# Patient Record
Sex: Male | Born: 1960 | Race: Black or African American | Hispanic: No | Marital: Married | Smoking: Never smoker
Health system: Southern US, Community
[De-identification: ages and names within clinical notes are randomized; demographics above are authoritative.]

## PROBLEM LIST (undated history)

## (undated) DIAGNOSIS — M1990 Unspecified osteoarthritis, unspecified site: Secondary | ICD-10-CM

## (undated) DIAGNOSIS — Z95810 Presence of automatic (implantable) cardiac defibrillator: Secondary | ICD-10-CM

## (undated) DIAGNOSIS — N189 Chronic kidney disease, unspecified: Secondary | ICD-10-CM

## (undated) DIAGNOSIS — I5020 Unspecified systolic (congestive) heart failure: Secondary | ICD-10-CM

## (undated) DIAGNOSIS — R918 Other nonspecific abnormal finding of lung field: Secondary | ICD-10-CM

## (undated) DIAGNOSIS — I509 Heart failure, unspecified: Secondary | ICD-10-CM

## (undated) DIAGNOSIS — G4730 Sleep apnea, unspecified: Secondary | ICD-10-CM

## (undated) DIAGNOSIS — I472 Ventricular tachycardia, unspecified: Secondary | ICD-10-CM

## (undated) DIAGNOSIS — D447 Neoplasm of uncertain behavior of aortic body and other paraganglia: Secondary | ICD-10-CM

## (undated) DIAGNOSIS — E039 Hypothyroidism, unspecified: Secondary | ICD-10-CM

## (undated) DIAGNOSIS — D494 Neoplasm of unspecified behavior of bladder: Secondary | ICD-10-CM

## (undated) DIAGNOSIS — I2510 Atherosclerotic heart disease of native coronary artery without angina pectoris: Secondary | ICD-10-CM

## (undated) DIAGNOSIS — I10 Essential (primary) hypertension: Secondary | ICD-10-CM

## (undated) HISTORY — DX: Other nonspecific abnormal finding of lung field: R91.8

## (undated) HISTORY — PX: US ECHOCARDIOGRAPHY: HXRAD669

## (undated) HISTORY — DX: Ventricular tachycardia: I47.2

## (undated) HISTORY — DX: Sleep apnea, unspecified: G47.30

## (undated) HISTORY — PX: CARDIAC CATHETERIZATION: SHX172

## (undated) HISTORY — PX: BIV ICD GENERTAOR CHANGE OUT: SHX5745

## (undated) HISTORY — PX: TONSILLECTOMY: SUR1361

## (undated) HISTORY — PX: OTHER SURGICAL HISTORY: SHX169

## (undated) HISTORY — PX: CHOLECYSTECTOMY: SHX55

## (undated) HISTORY — DX: Ventricular tachycardia, unspecified: I47.20

## (undated) HISTORY — PX: CARDIAC DEFIBRILLATOR PLACEMENT: SHX171

## (undated) HISTORY — DX: Essential (primary) hypertension: I10

---

## 1997-06-13 ENCOUNTER — Ambulatory Visit (HOSPITAL_COMMUNITY): Admission: RE | Admit: 1997-06-13 | Discharge: 1997-06-13 | Payer: Self-pay | Admitting: Cardiovascular Disease

## 2002-06-11 ENCOUNTER — Encounter: Admission: RE | Admit: 2002-06-11 | Discharge: 2002-09-09 | Payer: Self-pay | Admitting: Family Medicine

## 2002-07-19 ENCOUNTER — Encounter: Payer: Self-pay | Admitting: Nephrology

## 2002-07-19 ENCOUNTER — Encounter: Admission: RE | Admit: 2002-07-19 | Discharge: 2002-07-19 | Payer: Self-pay | Admitting: Nephrology

## 2003-04-11 ENCOUNTER — Ambulatory Visit (HOSPITAL_BASED_OUTPATIENT_CLINIC_OR_DEPARTMENT_OTHER): Admission: RE | Admit: 2003-04-11 | Discharge: 2003-04-11 | Payer: Self-pay | Admitting: Orthopedic Surgery

## 2003-04-11 ENCOUNTER — Ambulatory Visit (HOSPITAL_COMMUNITY): Admission: RE | Admit: 2003-04-11 | Discharge: 2003-04-11 | Payer: Self-pay | Admitting: Orthopedic Surgery

## 2005-05-02 ENCOUNTER — Ambulatory Visit: Payer: Self-pay | Admitting: Otolaryngology

## 2005-05-02 ENCOUNTER — Encounter: Payer: Self-pay | Admitting: Internal Medicine

## 2005-05-02 ENCOUNTER — Ambulatory Visit (HOSPITAL_BASED_OUTPATIENT_CLINIC_OR_DEPARTMENT_OTHER): Admission: RE | Admit: 2005-05-02 | Discharge: 2005-05-02 | Payer: Self-pay | Admitting: Otolaryngology

## 2005-12-15 ENCOUNTER — Encounter (HOSPITAL_COMMUNITY): Admission: RE | Admit: 2005-12-15 | Discharge: 2006-03-15 | Payer: Self-pay | Admitting: Cardiovascular Disease

## 2006-01-11 ENCOUNTER — Ambulatory Visit: Payer: Self-pay | Admitting: Internal Medicine

## 2006-10-27 ENCOUNTER — Ambulatory Visit: Payer: Self-pay | Admitting: Internal Medicine

## 2006-10-27 ENCOUNTER — Inpatient Hospital Stay (HOSPITAL_COMMUNITY): Admission: EM | Admit: 2006-10-27 | Discharge: 2006-11-04 | Payer: Self-pay | Admitting: Emergency Medicine

## 2006-10-27 ENCOUNTER — Encounter: Payer: Self-pay | Admitting: Internal Medicine

## 2006-11-16 ENCOUNTER — Ambulatory Visit: Payer: Self-pay

## 2006-12-08 ENCOUNTER — Ambulatory Visit: Payer: Self-pay | Admitting: Internal Medicine

## 2006-12-08 ENCOUNTER — Inpatient Hospital Stay (HOSPITAL_COMMUNITY): Admission: AD | Admit: 2006-12-08 | Discharge: 2006-12-12 | Payer: Self-pay | Admitting: Internal Medicine

## 2007-02-21 ENCOUNTER — Ambulatory Visit: Payer: Self-pay | Admitting: Internal Medicine

## 2007-03-12 ENCOUNTER — Ambulatory Visit: Payer: Self-pay | Admitting: Pulmonary Disease

## 2007-03-12 DIAGNOSIS — G4733 Obstructive sleep apnea (adult) (pediatric): Secondary | ICD-10-CM | POA: Insufficient documentation

## 2007-03-12 DIAGNOSIS — I10 Essential (primary) hypertension: Secondary | ICD-10-CM | POA: Insufficient documentation

## 2007-03-12 DIAGNOSIS — I428 Other cardiomyopathies: Secondary | ICD-10-CM | POA: Insufficient documentation

## 2007-04-09 ENCOUNTER — Ambulatory Visit: Payer: Self-pay | Admitting: Pulmonary Disease

## 2007-05-29 ENCOUNTER — Encounter: Payer: Self-pay | Admitting: Pulmonary Disease

## 2007-06-05 ENCOUNTER — Ambulatory Visit: Payer: Self-pay | Admitting: Internal Medicine

## 2007-06-07 ENCOUNTER — Ambulatory Visit: Payer: Self-pay | Admitting: Pulmonary Disease

## 2007-07-22 ENCOUNTER — Encounter: Payer: Self-pay | Admitting: Pulmonary Disease

## 2007-09-06 ENCOUNTER — Ambulatory Visit: Payer: Self-pay | Admitting: Pulmonary Disease

## 2007-10-23 ENCOUNTER — Ambulatory Visit: Payer: Self-pay | Admitting: Internal Medicine

## 2007-10-23 LAB — CONVERTED CEMR LAB
AST: 24 units/L (ref 0–37)
Albumin: 3.6 g/dL (ref 3.5–5.2)
Digitoxin Lvl: 1.2 ng/mL (ref 0.8–2.0)
TSH: 1.23 microintl units/mL (ref 0.35–5.50)
Total Bilirubin: 0.8 mg/dL (ref 0.3–1.2)

## 2008-02-11 ENCOUNTER — Ambulatory Visit: Payer: Self-pay | Admitting: Internal Medicine

## 2008-02-21 ENCOUNTER — Ambulatory Visit (HOSPITAL_COMMUNITY): Admission: RE | Admit: 2008-02-21 | Discharge: 2008-02-21 | Payer: Self-pay | Admitting: Internal Medicine

## 2008-02-21 ENCOUNTER — Ambulatory Visit: Payer: Self-pay | Admitting: Internal Medicine

## 2008-03-09 ENCOUNTER — Emergency Department (HOSPITAL_BASED_OUTPATIENT_CLINIC_OR_DEPARTMENT_OTHER): Admission: EM | Admit: 2008-03-09 | Discharge: 2008-03-09 | Payer: Self-pay | Admitting: Emergency Medicine

## 2008-03-09 ENCOUNTER — Ambulatory Visit: Payer: Self-pay | Admitting: Diagnostic Radiology

## 2008-03-11 ENCOUNTER — Ambulatory Visit: Payer: Self-pay | Admitting: Internal Medicine

## 2008-03-17 ENCOUNTER — Encounter: Payer: Self-pay | Admitting: Cardiovascular Disease

## 2008-03-25 ENCOUNTER — Ambulatory Visit: Payer: Self-pay | Admitting: Internal Medicine

## 2008-03-25 DIAGNOSIS — R0602 Shortness of breath: Secondary | ICD-10-CM | POA: Insufficient documentation

## 2008-03-25 DIAGNOSIS — R930 Abnormal findings on diagnostic imaging of skull and head, not elsewhere classified: Secondary | ICD-10-CM | POA: Insufficient documentation

## 2008-03-26 LAB — CONVERTED CEMR LAB
Basophils Absolute: 0 10*3/uL (ref 0.0–0.1)
Basophils Relative: 0 % (ref 0.0–3.0)
CO2: 30 meq/L (ref 19–32)
Chloride: 106 meq/L (ref 96–112)
Glucose, Bld: 116 mg/dL — ABNORMAL HIGH (ref 70–99)
Hemoglobin: 14.5 g/dL (ref 13.0–17.0)
Lymphocytes Relative: 15 % (ref 12.0–46.0)
MCHC: 33.2 g/dL (ref 30.0–36.0)
Monocytes Relative: 6.2 % (ref 3.0–12.0)
Neutro Abs: 9.9 10*3/uL — ABNORMAL HIGH (ref 1.4–7.7)
Neutrophils Relative %: 78.4 % — ABNORMAL HIGH (ref 43.0–77.0)
Pro B Natriuretic peptide (BNP): 291 pg/mL — ABNORMAL HIGH (ref 0.0–100.0)
RBC: 5.04 M/uL (ref 4.22–5.81)
RDW: 15.3 % — ABNORMAL HIGH (ref 11.5–14.6)
Sed Rate: 17 mm/hr — ABNORMAL HIGH (ref 0–16)
Sodium: 139 meq/L (ref 135–145)
TSH: 1.47 microintl units/mL (ref 0.35–5.50)

## 2008-04-03 ENCOUNTER — Ambulatory Visit: Payer: Self-pay | Admitting: Internal Medicine

## 2008-04-03 LAB — CONVERTED CEMR LAB
CO2: 27 meq/L (ref 19–32)
Chloride: 105 meq/L (ref 96–112)
Eosinophils Relative: 0.7 % (ref 0.0–5.0)
GFR calc Af Amer: 59 mL/min
Glucose, Bld: 113 mg/dL — ABNORMAL HIGH (ref 70–99)
INR: 1.2 — ABNORMAL HIGH (ref 0.8–1.0)
Lymphocytes Relative: 23.6 % (ref 12.0–46.0)
Monocytes Absolute: 0.6 10*3/uL (ref 0.1–1.0)
Monocytes Relative: 6.7 % (ref 3.0–12.0)
Neutrophils Relative %: 68.4 % (ref 43.0–77.0)
Platelets: 182 10*3/uL (ref 150–400)
Potassium: 4.1 meq/L (ref 3.5–5.1)
Prothrombin Time: 12.4 s (ref 10.9–13.3)
RDW: 15.1 % — ABNORMAL HIGH (ref 11.5–14.6)
Sodium: 139 meq/L (ref 135–145)
WBC: 8.8 10*3/uL (ref 4.5–10.5)

## 2008-04-10 ENCOUNTER — Ambulatory Visit: Payer: Self-pay | Admitting: Internal Medicine

## 2008-04-10 ENCOUNTER — Inpatient Hospital Stay (HOSPITAL_COMMUNITY): Admission: RE | Admit: 2008-04-10 | Discharge: 2008-04-12 | Payer: Self-pay | Admitting: Internal Medicine

## 2008-04-14 ENCOUNTER — Ambulatory Visit: Payer: Self-pay | Admitting: Internal Medicine

## 2008-04-15 ENCOUNTER — Encounter: Payer: Self-pay | Admitting: Internal Medicine

## 2008-05-15 ENCOUNTER — Encounter: Payer: Self-pay | Admitting: Internal Medicine

## 2008-06-12 ENCOUNTER — Ambulatory Visit (HOSPITAL_COMMUNITY): Admission: RE | Admit: 2008-06-12 | Discharge: 2008-06-13 | Payer: Self-pay | Admitting: Otolaryngology

## 2008-06-16 ENCOUNTER — Telehealth (INDEPENDENT_AMBULATORY_CARE_PROVIDER_SITE_OTHER): Payer: Self-pay | Admitting: *Deleted

## 2008-06-18 ENCOUNTER — Ambulatory Visit: Payer: Self-pay | Admitting: Internal Medicine

## 2008-07-24 ENCOUNTER — Telehealth: Payer: Self-pay | Admitting: Internal Medicine

## 2008-08-11 ENCOUNTER — Telehealth (INDEPENDENT_AMBULATORY_CARE_PROVIDER_SITE_OTHER): Payer: Self-pay | Admitting: *Deleted

## 2008-09-23 ENCOUNTER — Telehealth (INDEPENDENT_AMBULATORY_CARE_PROVIDER_SITE_OTHER): Payer: Self-pay | Admitting: *Deleted

## 2008-10-06 ENCOUNTER — Telehealth (INDEPENDENT_AMBULATORY_CARE_PROVIDER_SITE_OTHER): Payer: Self-pay | Admitting: *Deleted

## 2008-10-20 ENCOUNTER — Encounter: Payer: Self-pay | Admitting: Internal Medicine

## 2008-10-20 ENCOUNTER — Ambulatory Visit: Payer: Self-pay | Admitting: Internal Medicine

## 2008-10-20 DIAGNOSIS — I472 Ventricular tachycardia, unspecified: Secondary | ICD-10-CM | POA: Insufficient documentation

## 2008-10-20 DIAGNOSIS — I4729 Other ventricular tachycardia: Secondary | ICD-10-CM | POA: Insufficient documentation

## 2008-10-23 ENCOUNTER — Telehealth (INDEPENDENT_AMBULATORY_CARE_PROVIDER_SITE_OTHER): Payer: Self-pay | Admitting: *Deleted

## 2008-10-23 ENCOUNTER — Encounter: Payer: Self-pay | Admitting: Internal Medicine

## 2008-11-06 ENCOUNTER — Encounter (HOSPITAL_COMMUNITY): Admission: RE | Admit: 2008-11-06 | Discharge: 2009-02-04 | Payer: Self-pay | Admitting: Internal Medicine

## 2008-11-06 ENCOUNTER — Telehealth (INDEPENDENT_AMBULATORY_CARE_PROVIDER_SITE_OTHER): Payer: Self-pay | Admitting: *Deleted

## 2008-11-12 ENCOUNTER — Encounter: Payer: Self-pay | Admitting: Internal Medicine

## 2008-11-13 ENCOUNTER — Encounter: Payer: Self-pay | Admitting: Internal Medicine

## 2008-12-09 ENCOUNTER — Encounter: Payer: Self-pay | Admitting: Internal Medicine

## 2008-12-17 ENCOUNTER — Encounter: Payer: Self-pay | Admitting: Internal Medicine

## 2008-12-25 ENCOUNTER — Encounter: Payer: Self-pay | Admitting: Internal Medicine

## 2008-12-30 ENCOUNTER — Encounter: Payer: Self-pay | Admitting: Internal Medicine

## 2009-01-27 ENCOUNTER — Encounter: Payer: Self-pay | Admitting: Internal Medicine

## 2009-02-02 ENCOUNTER — Ambulatory Visit: Payer: Self-pay | Admitting: Internal Medicine

## 2009-02-11 ENCOUNTER — Encounter: Payer: Self-pay | Admitting: Internal Medicine

## 2009-03-12 ENCOUNTER — Encounter: Payer: Self-pay | Admitting: Internal Medicine

## 2009-05-05 ENCOUNTER — Ambulatory Visit: Payer: Self-pay | Admitting: Internal Medicine

## 2009-05-12 ENCOUNTER — Encounter: Payer: Self-pay | Admitting: Internal Medicine

## 2009-05-25 ENCOUNTER — Encounter: Payer: Self-pay | Admitting: Internal Medicine

## 2009-07-02 ENCOUNTER — Encounter: Admission: RE | Admit: 2009-07-02 | Discharge: 2009-07-02 | Payer: Self-pay | Admitting: Family Medicine

## 2009-07-03 ENCOUNTER — Inpatient Hospital Stay (HOSPITAL_COMMUNITY): Admission: EM | Admit: 2009-07-03 | Discharge: 2009-07-08 | Payer: Self-pay | Admitting: Emergency Medicine

## 2009-07-03 ENCOUNTER — Ambulatory Visit (HOSPITAL_BASED_OUTPATIENT_CLINIC_OR_DEPARTMENT_OTHER): Admission: RE | Admit: 2009-07-03 | Discharge: 2009-07-03 | Payer: Self-pay | Admitting: Family Medicine

## 2009-07-03 ENCOUNTER — Ambulatory Visit: Payer: Self-pay | Admitting: Diagnostic Radiology

## 2009-07-04 ENCOUNTER — Encounter (INDEPENDENT_AMBULATORY_CARE_PROVIDER_SITE_OTHER): Payer: Self-pay | Admitting: Surgery

## 2009-08-10 ENCOUNTER — Encounter: Payer: Self-pay | Admitting: Internal Medicine

## 2009-08-11 ENCOUNTER — Ambulatory Visit: Payer: Self-pay | Admitting: Internal Medicine

## 2009-09-18 ENCOUNTER — Encounter: Payer: Self-pay | Admitting: Internal Medicine

## 2009-11-03 ENCOUNTER — Ambulatory Visit: Payer: Self-pay | Admitting: Internal Medicine

## 2009-11-03 DIAGNOSIS — R42 Dizziness and giddiness: Secondary | ICD-10-CM | POA: Insufficient documentation

## 2009-11-10 LAB — CONVERTED CEMR LAB
Chloride: 104 meq/L (ref 96–112)
GFR calc non Af Amer: 85.49 mL/min (ref >60)
Glucose, Bld: 74 mg/dL (ref 70–99)
Potassium: 4.4 meq/L (ref 3.5–5.1)
Sodium: 140 meq/L (ref 135–145)

## 2009-11-12 ENCOUNTER — Ambulatory Visit: Payer: Self-pay | Admitting: Internal Medicine

## 2009-11-12 DIAGNOSIS — E0590 Thyrotoxicosis, unspecified without thyrotoxic crisis or storm: Secondary | ICD-10-CM | POA: Insufficient documentation

## 2009-12-07 LAB — CONVERTED CEMR LAB
Free T4: 1.59 ng/dL (ref 0.60–1.60)
T3, Free: 3.7 pg/mL (ref 2.3–4.2)
TSH: 0.09 microintl units/mL — ABNORMAL LOW (ref 0.35–5.50)

## 2010-02-04 ENCOUNTER — Ambulatory Visit: Payer: Self-pay | Admitting: Internal Medicine

## 2010-02-05 ENCOUNTER — Encounter: Payer: Self-pay | Admitting: Internal Medicine

## 2010-02-11 ENCOUNTER — Encounter: Payer: Self-pay | Admitting: Internal Medicine

## 2010-02-19 ENCOUNTER — Encounter: Payer: Self-pay | Admitting: Internal Medicine

## 2010-03-11 ENCOUNTER — Ambulatory Visit: Payer: Self-pay | Admitting: Cardiovascular Disease

## 2010-03-27 ENCOUNTER — Encounter: Payer: Self-pay | Admitting: Internal Medicine

## 2010-04-08 NOTE — Miscellaneous (Signed)
Summary: Device upgrade  Clinical Lists Changes  Observations: Added new observation of ICDLEADSTAT3: active (03/27/2010 11:40) Added new observation of ICDLEADSER3: [id number] (03/27/2010 11:40) Added new observation of ICDLEADMOD3: 4194  (03/27/2010 11:40) Added new observation of ICDLEADDOI3: 04/11/2008  (03/27/2010 11:40) Added new observation of ICDLEADLOC3: LV  (03/27/2010 11:40) Added new observation of ICDLEADSTAT2: active  (03/27/2010 11:40) Added new observation of ICDLEADSER2: [id number]  (03/27/2010 11:40) Added new observation of ICDLEADMOD2: 5076  (03/27/2010 11:40) Added new observation of ICDLEADDOI2: 04/11/2008  (03/27/2010 11:40) Added new observation of ICDLEADLOC2: RA  (03/27/2010 11:40) Added new observation of ICD IMPL DTE: 04/11/2008  (03/27/2010 11:40) Added new observation of ICD SERL#: [id number]  (03/27/2010 11:40) Added new observation of ICD MODL#: [id number]  (03/27/2010 11:40) Added new observation of ICDEXPLCOMM: 04/11/08 Medtronic [id number] H explanted  (03/27/2010 11:40)       ICD Specifications Following MD:  Sherryl Manges, MD     ICD Vendor:  Medtronic     ICD Model Number:  [phone]     ICD Serial Number:  [id number] ICD DOI:  04/11/2008     ICD Implanting MD:  Sherryl Manges, MD  Lead 1:    Location: RV     DOI: 11/03/2006     Model #: [id number]     Serial #: [id number]     Status: active Lead 2:    Location: RA     DOI: 04/11/2008     Model #: [id number]     Serial #: [id number]     Status: active Lead 3:    Location: LV     DOI: 04/11/2008     Model #: [id number]     Serial #: [id number]     Status: active  Indications::  VT; NICM  Explantation Comments: 04/11/08 Medtronic [id number]/[id number] H explanted  ICD Follow Up ICD Dependent:  No       ICD Device Measurements Configuration: LVtip-LV ring  Brady Parameters Mode DDD     Lower Rate Limit:  50     Upper Rate Limit 130 PAV 150     Sensed AV Delay:  120  Tachy Zones VF:  207     VT:  200-207 (FVT VIA  VT)     VT1:  146

## 2010-04-08 NOTE — Letter (Signed)
Summary: [facility]  [facility]   Imported By: Lennie Odor 03/30/2009 10:26:54  _____________________________________________________________________  External Attachment:    Type:   Image     Comment:   External Document

## 2010-04-08 NOTE — Letter (Signed)
Summary: Device-Delinquent Phone Journalist, newspaper, Main Office  [address]   [place], [place] [address]   Phone: [phone]  Fax: [phone]     February 05, 2010 MRN: [id number]   SHAD LEDVINA [address] [place], [place]  [address]   Dear Mr. CRISMAN,  According to our records, you were scheduled for a device phone transmission on 02-04-2010.     We did not receive any results from this check.  If you transmitted on your scheduled day, please call us to help troubleshoot your system.  If you forgot to send your transmission, please send one upon receipt of this letter.  Thank you,   Architectural technologist [facility]

## 2010-04-08 NOTE — Assessment & Plan Note (Signed)
Summary: [name]   Referring Provider:  Dr.Nahser Primary Provider:  Catha Gosselin  CC:  device check.  Pt states he does occasionally feel lightheaded.  History of Present Illness: [name] is seen in followup for congestive heart failure in the setting of nonischemic cardiomyopathy. He is status post CRT-D. implantation.  get he has been doing pretty well. He has not had significant edema. His biggest complaint is lightheadedness particularly upon standing. He also has a cloudiness in his head that is relatively persistent.  He underwent cholecystectomy in May. He was told it was "quite infected". he is unaware of episodes of ventricular tachycardia that were temporally associated with the event    Current Medications (verified): 1)  Lipitor 40 Mg  Tabs (Atorvastatin Calcium) .... Take 1 Tablet By Mouth Once A Day 2)  Spironolactone 25 Mg  Tabs (Spironolactone) .... Twice Weekly 3)  Carvedilol 25 Mg  Tabs (Carvedilol) .... Take 1 Tablet By Mouth Two Times A Day 4)  Amiodarone Hcl 200 Mg Tabs (Amiodarone Hcl) .Marland Kitchen.. 1 Once Daily 3 Times Per Wk 5)  Lanoxin 0.25 Mg  Tabs (Digoxin) .... Take 1 Tablet By Mouth Once A Day 6)  Benicar 40 Mg  Tabs (Olmesartan Medoxomil) .... One Tablet By Mouth Daily  Allergies (verified): No Known Drug Allergies  Past History:  Past Medical History: Last updated: 10/20/2008 AUTOMATIC IMPLANTABLE CARDIAC DEFIBRILLATOR SITU (ICD-V45.02)-Medtronic Virtuoso D154VWC ESSENTIAL HYPERTENSION, BENIGN (ICD-401.1) CARDIOMYOPATHY (ICD-425.4)     - Echocardiogram 3/07  OBSTRUCTIVE SLEEP APNEA (ICD-327.23) Right Paratracheal Mass    - Detected 10/2006    - No change June 18, 2008  Gout  Past Surgical History: Last updated: 10/18/2008 Deviated septum surgery April 2010  Implantation of AICD-Medtronic Virtuoso D154VWC  Family History: Last updated: 03/12/2007 pt was adopted...unknown  Social History: Last updated: 03/12/2007 works as  Engineer, civil (consulting) Patient never smoked.  married and has children.  Vital Signs:  Patient profile:   50 year old male Height:      70 inches Weight:      241 pounds BMI:     34.70 Pulse rate:   82 / minute Pulse rhythm:   regular BP sitting:   130 / 82  (left arm) Cuff size:   regular  Vitals Entered By: Judithe Modest CMA (November 03, 2009 12:46 PM)  Physical Exam  General:  The patient was alert and oriented in no acute distress. HEENT Normal.  Neck veins were flat, carotids were brisk.  Lungs were clear.  Heart sounds were regular without murmurs or gallops.  Abdomen was soft with active bowel sounds. There is no clubbing cyanosis or edema. Skin Warm and dry     ICD Specifications Following MD:  Sherryl Manges, MD     ICD Vendor:  Medtronic     ICD Model Number:  D154VWC     ICD Serial Number:  [id number] ICD DOI:  11/03/2006     ICD Implanting MD:  Sherryl Manges, MD  Lead 1:    Location: RV     DOI: 11/03/2006     Model #: [id number]     Serial #: [id number]     Status: active  Indications::  VT; NICM  Explantation Comments: Carelink  ICD Follow Up Battery Voltage:  3.07 V     Charge Time:  9.0 seconds     Underlying rhythm:  SR ICD Dependent:  No       ICD Device Measurements Atrium:  Amplitude: 2.9 mV, Impedance: 532  ohms, Threshold: 0.50 V at 0.40 msec Right Ventricle:  Amplitude: 20 mV, Impedance: 532 ohms, Threshold: 0.75 V at 0.40 msec Left Ventricle:  Impedance: 741 ohms, Threshold: 0.25 V at 0.40 msec Configuration: LVtip-LV ring  Episodes MS Episodes:  1     Percent Mode Switch:  <0.1%     Shock:  0     ATP:  3     Nonsustained:  3     Atrial Therapies:  0 Atrial Pacing:  0.2%     Ventricular Pacing:  97.4%  Brady Parameters Mode DDD     Lower Rate Limit:  50     Upper Rate Limit 130 PAV 150     Sensed AV Delay:  120  Tachy Zones VF:  207     VT:  200-207 (FVT VIA VT)     VT1:  146     Next Remote Date:  02/04/2010     Next Cardiology Appt Due:   10/06/2010 Tech Comments:  3 TREATED VT EPISODES W/SUCCESSFUL ATP THERAPY.  NORMAL DEVICE FUNCTION.  CHANGED RA OUTPUT FROM 3.50 TO 2.00 AND TURNED SVT 1:1 ON.  CARELINK CHECK 02-04-10. ROV IN 12 MTHS W/SK. Vella Kohler  November 03, 2009 1:18 PM  Impression & Recommendations:  Problem # 1:  SYSTOLIC HEART FAILURE, CHRONIC (ICD-428.22)  relatively stable on his current medications. His updated medication list for this problem includes:    Spironolactone 25 Mg Tabs (Spironolactone) .Marland Kitchen... Twice weekly    Carvedilol 25 Mg Tabs (Carvedilol) .Marland Kitchen... Take 1 tablet by mouth two times a day    Amiodarone Hcl 200 Mg Tabs (Amiodarone hcl) .Marland Kitchen... 1 once daily 3 times per wk    Lanoxin 0.25 Mg Tabs (Digoxin) .Marland Kitchen... Take 1 tablet by mouth once a day    Benicar 40 Mg Tabs (Olmesartan medoxomil) ..... One tablet by mouth daily  His updated medication list for this problem includes:    Spironolactone 25 Mg Tabs (Spironolactone) .Marland Kitchen... Twice weekly    Carvedilol 25 Mg Tabs (Carvedilol) .Marland Kitchen... Take 1 tablet by mouth two times a day    Amiodarone Hcl 200 Mg Tabs (Amiodarone hcl) .Marland Kitchen... 1 once daily 3 times per wk    Lanoxin 0.25 Mg Tabs (Digoxin) .Marland Kitchen... Take 1 tablet by mouth once a day    Benicar 40 Mg Tabs (Olmesartan medoxomil) ..... One tablet by mouth daily  Problem # 2:  Hx of VENTRICULAR TACHYCARDIA (ICD-427.1) patient recurrent ventricular tachycardia temporally related to his GB surgery.  WIll continue current meds for now  LFTs were normal in May. We'll need to recheck his TSH. 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 We will plan to stop the amiodaroneand see him again in 3 months  Problem # 4:  IMPLANTATION OF DEFIBRILLATOR,MEDTRONIC VIRTUOSO D154VWC (ICD-V45.02) Device parameters and data were reviewed and no changes were  made  Problem # 5:  CARDIOMYOPATHY (ICD-425.4) stable on current meds; renal function has been borderline in the past. we'll recheck a metabolic profile today as well as a digoxin level and a TSH His updated medication list for this problem includes:    Spironolactone 25 Mg Tabs (Spironolactone) .Marland Kitchen... Twice weekly    Carvedilol 25 Mg Tabs (Carvedilol) .Marland Kitchen... Take 1 tablet by mouth two times a day    Amiodarone Hcl 200 Mg Tabs (Amiodarone hcl) .Marland Kitchen... 1 once daily 3  times per wk    Lanoxin 0.25 Mg Tabs (Digoxin) .Marland Kitchen... Take 1 tablet by mouth once a day    Benicar 40 Mg Tabs (Olmesartan medoxomil) ..... One tablet by mouth daily  Other Orders: TLB-BMP (Basic Metabolic Panel-BMET) (80048-METABOL) TLB-TSH (Thyroid Stimulating Hormone) (84443-TSH) TLB-Digoxin (Lanoxin) (80162-DIG)  Patient Instructions: 1)  Your physician recommends that you schedule a follow-up appointment in: 3 MONTHS WITH DR Graciela Husbands 2)  Your physician recommends that you return for lab work EA:VWUJW BMET TSH DIG LEVEL 3)  Your physician has recommended you make the following change in your medication: HOLD AMIODARONE

## 2010-04-08 NOTE — Letter (Signed)
Summary: Remote Device Check  [employer], Main Office  [address]   [place], [place] [address]   Phone: [phone]  Fax: [phone]     September 18, 2009 MRN: [id number]   GRADIE OHM [address] [place], [place]  [address]   Dear Mr. MESSER,   Your remote transmission was recieved and reviewed by your physician.  All diagnostics were within normal limits for you.   __X____Your next office visit is scheduled for:   August with Dr Graciela Husbands. Please call our office to schedule an appointment.    Sincerely,  Vella Kohler

## 2010-04-08 NOTE — Letter (Signed)
Summary: [facility] Office Note  [facility] Office Note   Imported By: Roderic Ovens 04/01/2009 10:47:19  _____________________________________________________________________  External Attachment:    Type:   Image     Comment:   External Document

## 2010-04-08 NOTE — Cardiovascular Report (Signed)
Summary: Office Visit Remote   Office Visit Remote   Imported By: Roderic Ovens 02/22/2010 16:00:04  _____________________________________________________________________  External Attachment:    Type:   Image     Comment:   External Document

## 2010-04-08 NOTE — Letter (Signed)
Summary: Remote Device Check  [employer] [facility], Main Office  [address]   [place], [place] [address]   Phone: [phone]  Fax: [phone]     May 25, 2009 MRN: [id number]   [name] [address] [place], [place]  [address]   Dear [name],   Your remote transmission was recieved and reviewed by your physician.  All diagnostics were within normal limits for you.  __X___Your next transmission is scheduled for:   August 11, 2009.  Please transmit at any time this day.  If you have a wireless device your transmission will be sent automatically.     Sincerely,  Proofreader

## 2010-04-08 NOTE — Cardiovascular Report (Signed)
Summary: Office Visit Remote   Office Visit Remote   Imported By: Roderic Ovens 09/21/2009 14:52:29  _____________________________________________________________________  External Attachment:    Type:   Image     Comment:   External Document

## 2010-04-08 NOTE — Cardiovascular Report (Signed)
Summary: Office Visit Remote   Office Visit Remote   Imported By: Roderic Ovens 05/27/2009 13:53:42  _____________________________________________________________________  External Attachment:    Type:   Image     Comment:   External Document

## 2010-04-08 NOTE — Letter (Signed)
Summary: Remote Device Check  [facility] [employer], Main Office  [address]   [place], [place] [address]   Phone: [phone]  Fax: [phone]     February 19, 2010 MRN: [id number]   [name] [address] [place], [place]  [address]   Dear [name],   Your remote transmission was recieved and reviewed by your physician.  All diagnostics were within normal limits for you.  __X____Your next office visit is scheduled for:  05-11-10 @ 1530 with Dr Graciela Husbands.    Sincerely,  Vella Kohler

## 2010-05-11 ENCOUNTER — Encounter (INDEPENDENT_AMBULATORY_CARE_PROVIDER_SITE_OTHER): Payer: 59 | Admitting: Internal Medicine

## 2010-05-11 ENCOUNTER — Encounter: Payer: Self-pay | Admitting: Internal Medicine

## 2010-05-11 DIAGNOSIS — I472 Ventricular tachycardia, unspecified: Secondary | ICD-10-CM

## 2010-05-11 DIAGNOSIS — I4729 Other ventricular tachycardia: Secondary | ICD-10-CM

## 2010-05-11 DIAGNOSIS — Z95810 Presence of automatic (implantable) cardiac defibrillator: Secondary | ICD-10-CM

## 2010-05-11 DIAGNOSIS — I428 Other cardiomyopathies: Secondary | ICD-10-CM

## 2010-05-11 DIAGNOSIS — I5022 Chronic systolic (congestive) heart failure: Secondary | ICD-10-CM

## 2010-05-18 NOTE — Assessment & Plan Note (Signed)
Summary: DEVICE...RSC PER PT CALL/LG   Visit Type:  Follow-up Referring Provider:  Dr.Nahser Primary Provider:  Catha Gosselin   History of Present Illness: [name] is seen in followup for congestive heart failure in the setting of nonischemic cardiomyopathy. He is status post CRT-D. implantation.  His lightheadedness is much improved following discontinuation of the amiodarone.  Therefore she'll he has had problems with gout and is now on prednisone with a 10-15 pound in recurrent weight gain The patient denies SOB, chest pain, edema or palpitations   Current Medications (verified): 1)  Lipitor 40 Mg  Tabs (Atorvastatin Calcium) .... Take 1 Tablet By Mouth Once A Day 2)  Spironolactone 25 Mg  Tabs (Spironolactone) .... Twice Weekly 3)  Carvedilol 25 Mg  Tabs (Carvedilol) .... Take 1 Tablet By Mouth Two Times A Day 4)  Lanoxin 0.25 Mg  Tabs (Digoxin) .... Take 1 Tablet By Mouth Once A Day 5)  Benicar 40 Mg  Tabs (Olmesartan Medoxomil) .... One Tablet By Mouth Daily 6)  Prednisone 5 Mg Tabs (Prednisone) .... Once Daily 7)  Uloric 40 Mg Tabs (Febuxostat) .... Once Daily 8)  Colcrys 0.6 Mg Tabs (Colchicine) .... Once Daily  Allergies (verified): No Known Drug Allergies  Past History:  Past Medical History: AUTOMATIC IMPLANTABLE CARDIAC DEFIBRILLATOR SITU (ICD-V45.02)-Medtronic Virtuoso D154VWC ESSENTIAL HYPERTENSION, BENIGN (ICD-401.1) CARDIOMYOPATHY (ICD-425.4)     - Echocardiogram 3/07  OBSTRUCTIVE SLEEP APNEA (ICD-327.23) Right Paratracheal Mass    - Detected 10/2006    - No change June 18, 2008  Gout amiodarone neurotoxicity manifesedas  orthostasis  Vital Signs:  Patient profile:   50 year old male Height:      70 inches Weight:      253 pounds BMI:     36.43 Pulse rate:   73 / minute BP sitting:   110 / 80  (left arm)  Vitals Entered By: Laurance Flatten CMA (May 11, 2010 4:14 PM)  Physical Exam  General:  The patient was alert and oriented in no acute  distress. HEENT Normal.  Neck veins were flat, carotids were brisk.  Lungs were clear.  Heart sounds were irregular without murmurs or gallops.  Abdomen was soft with active bowel sounds. There is no clubbing cyanosis or edema. Skin Warm and dry    EKG  Procedure date:  05/11/2010  Findings:      sinus rhythm at 72 with P. synchronous pacing Intervals 0.17/0.13/24 2 Axis is leftward at -5 Occasional PVC   ICD Specifications Following MD:  Sherryl Manges, MD     ICD Vendor:  Medtronic     ICD Model Number:  [id number]     ICD Serial Number:  [id number] ICD DOI:  04/11/2008     ICD Implanting MD:  Sherryl Manges, MD  Lead 1:    Location: RV     DOI: 11/03/2006     Model #: 1829     Serial #: [id number]     Status: active Lead 2:    Location: RA     DOI: 04/11/2008     Model #: 9381     Serial #: [id number]     Status: active Lead 3:    Location: LV     DOI: 04/11/2008     Model #: 2778     Serial #: [id number]     Status: active  Indications::  VT; NICM  Explantation Comments: 04/11/08 Medtronic D154VWC/[id number] H explanted  ICD Follow Up ICD Dependent:  No  ICD Device Measurements Configuration: LVtip-LV ring  Brady Parameters Mode DDD     Lower Rate Limit:  50     Upper Rate Limit 130 PAV 150     Sensed AV Delay:  120  Tachy Zones VF:  207     VT:  200-207 (FVT VIA VT)     VT1:  146     Impression & Recommendations:  Problem # 1:  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 1 once daily 3 times per wk His updated medication list for this problem includes:    Carvedilol 25 Mg Tabs (Carvedilol) .Marland Kitchen... Take 1 tablet by mouth two times a day  Problem # 3:   SYSTOLIC HEART FAILURE, CHRONIC (ICD-428.22) table on his current medication  Orders: EKG w/ Interpretation (93000)  Problem # 4:  CARDIOMYOPATHY (ICD-425.4) stable on current meds The following medications were removed from the medication list:    Amiodarone Hcl 200 Mg Tabs (Amiodarone hcl) .Marland Kitchen... 1 once daily 3 times per wk His updated medication list for this problem includes:    Spironolactone 25 Mg Tabs (Spironolactone) .Marland Kitchen... Twice weekly    Carvedilol 25 Mg Tabs (Carvedilol) .Marland Kitchen... Take 1 tablet by mouth two times a day    Lanoxin 0.25 Mg Tabs (Digoxin) .Marland Kitchen... Take 1 tablet by mouth once a day    Benicar 40 Mg Tabs (Olmesartan medoxomil) ..... One tablet by mouth daily  Problem # 5:  IMPLANTATION OF DEFIBRILLATOR,MEDTRONIC VIRTUOSO D154VWC (ICD-V45.02) Device parameters and data were reviewed and no changes were made   Patient Instructions: 1)  Your physician recommends that you continue on your current medications as directed. Please refer to the Current Medication list given to you today. 2)  Your physician wants you to follow-up in: YEAR WITH DR Logan Bores will receive a reminder letter in the mail two months in advance. If you don't receive a letter, please call our office to schedule the follow-up appointment.

## 2010-05-25 LAB — COMPREHENSIVE METABOLIC PANEL
ALT: 57 U/L — ABNORMAL HIGH (ref 0–53)
AST: 45 U/L — ABNORMAL HIGH (ref 0–37)
Albumin: 2.9 g/dL — ABNORMAL LOW (ref 3.5–5.2)
Albumin: 2.9 g/dL — ABNORMAL LOW (ref 3.5–5.2)
BUN: 12 mg/dL (ref 6–23)
BUN: 16 mg/dL (ref 6–23)
CO2: 25 mEq/L (ref 19–32)
Calcium: 8.6 mg/dL (ref 8.4–10.5)
Chloride: 106 mEq/L (ref 96–112)
Creatinine, Ser: 1.72 mg/dL — ABNORMAL HIGH (ref 0.4–1.5)
Creatinine, Ser: 1.61 mg/dL — ABNORMAL HIGH (ref 0.4–1.5)
GFR calc Af Amer: 53 mL/min — ABNORMAL LOW (ref >60)
GFR calc non Af Amer: 46 mL/min — ABNORMAL LOW (ref >60)
Sodium: 140 mEq/L (ref 135–145)
Total Bilirubin: 1.2 mg/dL (ref 0.3–1.2)
Total Bilirubin: 2 mg/dL — ABNORMAL HIGH (ref 0.3–1.2)
Total Protein: 6.8 g/dL (ref 6.0–8.3)
Total Protein: 6.9 g/dL (ref 6.0–8.3)

## 2010-05-25 LAB — CBC
HCT: 39.6 % (ref 39.0–52.0)
HCT: 39.1 % (ref 39.0–52.0)
Hemoglobin: 12.6 g/dL — ABNORMAL LOW (ref 13.0–17.0)
MCHC: 32.9 g/dL (ref 30.0–36.0)
MCHC: 33.1 g/dL (ref 30.0–36.0)
MCV: 89.4 fL (ref 78.0–100.0)
MCV: 89.6 fL (ref 78.0–100.0)
MCV: 89.7 fL (ref 78.0–100.0)
Platelets: 255 10*3/uL (ref 150–400)
Platelets: 237 10*3/uL (ref 150–400)
RBC: 4.27 MIL/uL (ref 4.22–5.81)
RBC: 4.49 MIL/uL (ref 4.22–5.81)
RDW: 16.3 % — ABNORMAL HIGH (ref 11.5–15.5)
RDW: 16.5 % — ABNORMAL HIGH (ref 11.5–15.5)
WBC: 10.4 10*3/uL (ref 4.0–10.5)

## 2010-05-25 LAB — PROTIME-INR
INR: 1.32 (ref 0.00–1.49)
Prothrombin Time: 16.3 seconds — ABNORMAL HIGH (ref 11.6–15.2)

## 2010-05-25 NOTE — Cardiovascular Report (Signed)
Summary: Office Visit   Office Visit   Imported By: Roderic Ovens 05/17/2010 15:06:38  _____________________________________________________________________  External Attachment:    Type:   Image     Comment:   External Document

## 2010-06-16 LAB — BASIC METABOLIC PANEL
Calcium: 9.3 mg/dL (ref 8.4–10.5)
Chloride: 102 mEq/L (ref 96–112)
Creatinine, Ser: 1.5 mg/dL (ref 0.4–1.5)
GFR calc Af Amer: >60 mL/min (ref >60)
GFR calc non Af Amer: 50 mL/min — ABNORMAL LOW (ref >60)

## 2010-06-16 LAB — CBC
RBC: 5.31 MIL/uL (ref 4.22–5.81)
WBC: 10.5 10*3/uL (ref 4.0–10.5)

## 2010-06-21 LAB — BASIC METABOLIC PANEL
CO2: 26 mEq/L (ref 19–32)
Chloride: 106 mEq/L (ref 96–112)
GFR calc Af Amer: 52 mL/min — ABNORMAL LOW (ref >60)
Potassium: 5.1 mEq/L (ref 3.5–5.1)
Sodium: 140 mEq/L (ref 135–145)

## 2010-06-21 LAB — CBC
Hemoglobin: 13.8 g/dL (ref 13.0–17.0)
MCHC: 32.6 g/dL (ref 30.0–36.0)
MCV: 86.6 fL (ref 78.0–100.0)
RBC: 4.9 MIL/uL (ref 4.22–5.81)
WBC: 13.7 10*3/uL — ABNORMAL HIGH (ref 4.0–10.5)

## 2010-06-21 LAB — DIFFERENTIAL
Basophils Relative: 3 % — ABNORMAL HIGH (ref 0–1)
Eosinophils Absolute: 0 10*3/uL (ref 0.0–0.7)
Lymphs Abs: 1.9 10*3/uL (ref 0.7–4.0)
Monocytes Absolute: 0.9 10*3/uL (ref 0.1–1.0)
Monocytes Relative: 6 % (ref 3–12)

## 2010-06-22 LAB — POCT I-STAT 3, VENOUS BLOOD GAS (G3P V): Bicarbonate: 26.3 mEq/L — ABNORMAL HIGH (ref 20.0–24.0)

## 2010-06-22 LAB — CBC
Platelets: 178 10*3/uL (ref 150–400)
WBC: 11.9 10*3/uL — ABNORMAL HIGH (ref 4.0–10.5)

## 2010-06-22 LAB — BASIC METABOLIC PANEL
BUN: 15 mg/dL (ref 6–23)
BUN: 18 mg/dL (ref 6–23)
CO2: 23 mEq/L (ref 19–32)
Calcium: 8.3 mg/dL — ABNORMAL LOW (ref 8.4–10.5)
Chloride: 102 mEq/L (ref 96–112)
Creatinine, Ser: 1.35 mg/dL (ref 0.4–1.5)
Creatinine, Ser: 1.51 mg/dL — ABNORMAL HIGH (ref 0.4–1.5)
Creatinine, Ser: 1.65 mg/dL — ABNORMAL HIGH (ref 0.4–1.5)
GFR calc Af Amer: >60 mL/min (ref >60)
GFR calc non Af Amer: 45 mL/min — ABNORMAL LOW (ref >60)
GFR calc non Af Amer: 56 mL/min — ABNORMAL LOW (ref >60)
Potassium: 3.6 mEq/L (ref 3.5–5.1)

## 2010-06-22 LAB — BRAIN NATRIURETIC PEPTIDE
Pro B Natriuretic peptide (BNP): 205 pg/mL — ABNORMAL HIGH (ref 0.0–100.0)
Pro B Natriuretic peptide (BNP): 57 pg/mL (ref 0.0–100.0)

## 2010-06-22 LAB — GLUCOSE, CAPILLARY: Glucose-Capillary: 111 mg/dL — ABNORMAL HIGH (ref 70–99)

## 2010-07-20 NOTE — Discharge Summary (Signed)
[name], [name]              ACCOUNT NO.:  [id number]   MEDICAL RECORD NO.:  [id number]          PATIENT TYPE:  INP   LOCATION:  2906                         FACILITY:  [facility]   PHYSICIAN:  Hillis Range, MD       DATE OF BIRTH:  [birth year]   DATE OF ADMISSION:  04/10/2008  DATE OF DISCHARGE:  04/12/2008                               DISCHARGE SUMMARY   PRIMARY CARDIOLOGIST:  Vesta Mixer, MD   PRIMARY ELECTROPHYSIOLOGIST:  Doylene Canning. Ladona Ridgel, MD   PRINCIPAL DISCHARGE DIAGNOSES:  1. Severe nonischemic cardiomyopathy.      a.     Status post successful Medtronic biventricular implantable       cardioverter-defibrillator upgrade.  2. Chronic systolic heart failure.  3. Ventricular tachycardia.   HOSPITAL COURSE:  The patient presented for elective CRT upgrade of  previously implanted single-chamber defibrillator with history of  monomorphic ventricular tachycardia and nonischemic cardiomyopathy.  During the procedure, however, he became progressively short of breath  and the procedure had to be aborted.  He was transferred to the  intensive care unit on inotropic support.   Following stabilization, the patient returned to the lab to undergo  successful placement of a Medtronic biventricular defibrillator with  noted complications.  He was cleared for discharge the following  morning, by Dr. Hillis Range.  A postop chest x-ray was negative.   MEDICATIONS:  The patient was discharged with instruction to resume all  of his previous home medications.   FOLLOWUP:  1. The patient is instructed to arrange a followup with his primary      cardiologist, Dr. Kristeen Miss, in 1 week.  2. Arrangements will be made for [name] to follow up in the      [facility] in approximately 10 days.  3. We will also arrange to have him a followup with Dr. Sharrell Ku in      approximately 3 months.   DISCHARGE MEDICATIONS:  1. Amiodarone 200 mg daily.  2. Lanoxin  0.25 mg daily.  3. Lipitor 40 mg daily.  4. Spironolactone 25 mg daily.  5. Benicar 40 mg daily.  6. Coreg 25 mg b.i.d.   DISPOSITION:  Stable.   DISCHARGE DURATION:  Greater than 30 minutes.      Gene Serpe, PA-C      Hillis Range, MD  Electronically Signed    GS/MEDQ  D:  04/12/2008  T:  04/12/2008  Job:  [id number]

## 2010-07-20 NOTE — Discharge Summary (Signed)
[name], [name]              ACCOUNT NO.:  [id number]   MEDICAL RECORD NO.:  [id number]          PATIENT TYPE:  INP   LOCATION:  2038                         FACILITY:  [facility]   PHYSICIAN:  Vesta Mixer, M.D. DATE OF BIRTH:  24-May-1960   DATE OF ADMISSION:  10/27/2006  DATE OF DISCHARGE:  11/04/2006                               DISCHARGE SUMMARY   DISCHARGE DIAGNOSES:  1. Ventricular tachycardia.  2. Idiopathic dilated cardiomyopathy.  3. Hypertension.  4. Gout.   DISCHARGE MEDICATIONS:  1. Aspirin 325 mg a day.  2. Lanoxin 0.25 mg a day.  3. Aldactone 25 mg a day.  4. Zocor 80 mg a day.  5. Lisinopril 40 mg a day.  6. Coreg CR 80 mg a day.  7. Lasix 40 mg a day.  8. Colchicine 0.6 mg a day.  9. Darvocet as needed.   DISPOSITION:  The patient will follow up with Dr. [name] in 1-2 weeks.  He will see Dr. Graciela Husbands in the office for an ICD check.   HISTORY:  Mr. [name] is a 50 year old gentleman with a known history  of an idiopathic dilated cardiomyopathy.  He presented to the hospital  with a wide complex tachycardia and was found to have ventricular  tachycardia.  Please see dictated H&P for further details.   HOSPITAL COURSE:  Problem #1 - Wide complex tachycardia:  The patient  originally presented with weakness and dizziness.  He was brought to  [facility] by his wife.  He was originally thought to have  atrial flutter and he was given IV Cardizem and atropine.  This did not  resolve his tachycardia.  He became acutely ill and he was DC  cardioverted with resumption of sinus rhythm.     We had discussed the options of implanted defibrillator with the  patient multiple times and he had not wanted to have it performed.   Following this episode, the patient had congestive heart failure for the  next several days, presumably because of his prolonged episode of  tachycardia.  We slowly tuned up his heart with milrinone and diuretics,  and eventually he  had a defibrillator placed on November 03, 2006.  The  patient had no other problems with his ventricular tachycardia and was  stable upon discharge.   Problem #2 - Congestive heart failure:  The patient has a longstanding  history of an idiopathic dilated cardiomyopathy.  We have had him on ACE  inhibitor, diuretic and beta-blocker for quite some time.  It appears  that the patient sometimes forgets to take his medicines and this has  been somewhat of a challenge.  He acutely decompensated after his  episode of ventricular tachycardia, but that was probably rhythm based.  Will continue with the same medications.   Problem #3 - Gout:  The patient complained of gout.  We obtained an  Internal Medicine consult by Dr. Crista Curb.  She made some  recommendations, including gave colchicine as well as some Indocin with  improvement of his gout.   Problem #4 - Dyslipidemia - stable.   The  patient will follow up with Dr. [name] and Dr. Graciela Husbands as noted above.           ______________________________  Vesta Mixer, M.D.     PJN/MEDQ  D:  03/12/2007  T:  03/13/2007  Job:  [id number]   cc:   Duke Salvia, MD, [facility]

## 2010-07-20 NOTE — Op Note (Signed)
[name], [name]              ACCOUNT NO.:  [id number]   MEDICAL RECORD NO.:  [id number]          PATIENT TYPE:  INP   LOCATION:  2906                         FACILITY:  [facility]   PHYSICIAN:  Doylene Canning. Ladona Ridgel, MD    DATE OF BIRTH:  1960/09/05   DATE OF PROCEDURE:  04/11/2008  DATE OF DISCHARGE:                               OPERATIVE REPORT   PROCEDURE PERFORMED:  Upgrade of a single chamber defibrillator to a  biventricular implantable cardioverter-defibrillator with insertion of a  new left ventricular lead utilizing coronary sinus venography and  insertion of a new right atrial lead with removal of the previously  implanted defibrillator insertion of the new device with defibrillation  threshold testing.   INTRODUCTION:  The patient is a 50 year old male with a nonischemic  cardiomyopathy, congestive heart failure. status post ICD insertion.  The patient has a history of severe LV dysfunction, worsening heart  failure, left bundle-branch block, QRS duration of 150 milliseconds.  He  is now referred for ICD insertion.   PROCEDURE:  Implantable cardioverter-defibrillator upgrade.   PROCEDURE:  After informed consent was obtained, the patient was taken  to Diagnostic EP Lab in a fasting state.  After usual preparation and  draping, intravenous fentanyl and midazolam was given for sedation.  A  30 mL of lidocaine was infiltrated in the left infraclavicular region.  A 7-cm incision was carried out over this region.  Electrocautery was  utilized to dissect down the fascial plane.  The left subclavian vein  was punctured x2 and the Medtronic, model 5076, 52-cm active fixation  pacing lead, serial number [id number], was advanced into the right  atrium.  Mapping was carried out at the final site.  The P-waves  measured between 8 and 9 mV.  The pacing impedance was 900 ohms,  threshold 0.8 volts at 0.5 milliseconds, and a large injury current was  noted with active fixation of the  lead.  10 volts pacing did not  stimulate the diaphragm.  With the atrial lead in satisfactory position,  attention was turned to placement of the LV lead.  The coronary sinus  was cannulated without difficulty and venography was carried out.  This  demonstrated a nice sized posterior vein and 3 very small lateral veins.  The lateral vein was initially targeted for LV lead placement.  It was  cannulated without difficulty and subselective venography was carried  out demonstrating that this vein was in fact quite small and unsuitable  for LV lead placement.  The two other lateral veins also were evaluated  and the two were unsuitable for LV lead placement.  As such, attention  was turned back to the posterior vein and the Medtronic, model 4194, 88-  cm lead was advanced out into the posterior vein at the point about two  thirds from the base to apex.  In this location, we did have some  initial diaphragmatic stimulation, but the lead was adjusted slightly  and bipolar pacing configuration, the diaphragm was in fact not  stimulated.  With these satisfactory parameters, the lead was secured to  subpectoralis fascia  with figure-of-eight silk suture.  The sewing  sleeve was also secured with silk suture.  Electrocautery was utilized  to make a subcutaneous pocket.  Kanamycin irrigation was utilized to  irrigate the pocket.  The electrocautery was utilized to free up the old  generator and it was removed from its defibrillator lead.  The new  Medtronic Concerto II CRT-D, serial number [id number], was connected to  new atrial and LV leads as well as the RV lead and placed back in the  subcutaneous pocket.  At this point, the pocket was irrigated with  kanamycin and defibrillation threshold testing was carried out.   After the patient was more deeply sedated with fentanyl and Valium, VF  was induced with T-wave shock and a 20-joule shock was delivered which  terminated VF and restored sinus  rhythm.  At this point, the incision  was closed with 2-0 Vicryl and 3-0 Vicryl.  Benzoin was painted on skin.  Steri-Strips were applied and pressure dressing was placed, and the  patient returned to his room in satisfactory condition.   COMPLICATIONS:  There were no immediate procedure complications.   RESULTS:  This demonstrated successful implant upgrade from a single  chamber defibrillator to a biventricular defibrillator in a patient with  class III heart failure, left bundle-branch block, and nonischemic  cardiomyopathy.      Doylene Canning. Ladona Ridgel, MD  Electronically Signed     GWT/MEDQ  D:  04/11/2008  T:  04/12/2008  Job:  [id number]   cc:   Vesta Mixer, M.D.

## 2010-07-20 NOTE — Assessment & Plan Note (Signed)
[facility]                         ELECTROPHYSIOLOGY OFFICE NOTE   NAME:STREETERNasier, Thumm                     MRN:          [id number]  DATE:04/14/2008                            DOB:          01/31/1961    Mr. Ceesay returns today for followup.  He is a very pleasant young  male with a nonischemic cardiomyopathy and worsening congestive heart  failure who recently underwent upgrade to a biventricular ICD.  The  patient for the last week or so has been bothered by postnasal drainage  and cough which he notes makes him coughs so hard he gets lightheaded  and dizzy.  He has had no frank syncope from this, however, and denies  fevers or chills.  His other complaint today is that of sensation of  tingling and numbness in the distal thumb, index finger, and portion of  his middle finger which appeared to be in the C6 distribution of the  spinal nerve.  The patient denied injuring this.  He denies any change  in the color of his skin.  He denies any swelling in his left arm  despite his recent device upgrade.   This current medications include:  1. Amiodarone 200 mg daily.  2. Aldactone 25 a day.  3. Digoxin 0.25 mg daily.  4. Lipitor 40 a day.  5. Carvedilol 25 twice daily.  6. Benicar 40 a day.   On physical exam, he is a pleasant well-appearing middle-aged man in no  distress.  Blood pressure was 110/70, the pulse 60 and regular, the  respirations were 18.  The weight was 240 pounds.  Neck revealed no  jugular venous distention.  There was no thyromegaly.  The lungs were  clear bilaterally to auscultation.  There were no wheezes, rales, or  rhonchi.  There was no increased work of breathing.  The extremities  demonstrated no cyanosis, clubbing, or edema.  There was a good radial  pulse both in the left and the right arm.  The capillary refill was  almost immediate.   IMPRESSION:  1. Probable ongoing upper respiratory illness with no active  fevers or      chills but with persistent cough.  2. Numbness and tingling in his left hand, specifically in the distal      ends of his thumb and index and middle finger.  3. Congestive heart failure status post recent biventricular      implantable cardioverter-defibrillator upgrade.   DISCUSSION:  Mr. Hemenway appears to be stable.  I have recommended that  he try some saline drops for his nasal congestion as well as considering  Mucinex.  If this does not improve, then he is to go back to his ear,  nose, and throat doctor.  With regard to his numbness and tingling in  his fingers, I think this is likely to resolve, but told him that if it  gets worse he should call us.  Also if  his arm were to swell, we would like to know about this as well.  I will  plan to see the patient back in the office as  device followup has been  scheduled.     Doylene Canning. Ladona Ridgel, MD  Electronically Signed    GWT/MedQ  DD: 04/14/2008  DT: 04/15/2008  Job #: [id number]   cc:   Vesta Mixer, M.D.

## 2010-07-20 NOTE — Op Note (Signed)
NAMERANEY, KOEPPEN NO.:  [id number]   MEDICAL RECORD NO.:  [id number]          PATIENT TYPE:  INP   LOCATION:  2906                         FACILITY:  [facility]   PHYSICIAN:  Duke Salvia, MD, FACCDATE OF BIRTH:  Jun 22, 1960   DATE OF PROCEDURE:  04/10/2008  DATE OF DISCHARGE:                               OPERATIVE REPORT   Mr. [name] was brought to the Electrophysiology Laboratory for CRT  upgrade of his previously implanted single-chamber defibrillator  inserted a couple years ago for monomorphic VT in the setting of  nonischemic cardiomyopathy.  He has had progressive congestive heart  failure and was brought to the lab.  He underwent venography that  demonstrated patency of the extrathoracic left subclavian vein and while  being prepped for upgrade, became extremely dyspneic while lying flat.  The procedure had to be terminated.  The patient was then treated with  nitroglycerin, diuresis, and change in position and was then sent to the  CCU with milrinone support.   He will be tuned up with adjustments of his heart failure meds.  We  will plan to leave him on his inotropes over the weekend and anticipate  LV upgrade next week.      Duke Salvia, MD, [facility]  Electronically Signed     SCK/MEDQ  D:  04/10/2008  T:  04/11/2008  Job:  [id number]

## 2010-07-20 NOTE — Consult Note (Signed)
[name], [name]              ACCOUNT NO.:  [id number]   MEDICAL RECORD NO.:  [id number]          PATIENT TYPE:  INP   LOCATION:  2914                         FACILITY:  [facility]   PHYSICIAN:  Corinna L. Lendell Caprice, MDDATE OF BIRTH:  1960-11-14   DATE OF CONSULTATION:  DATE OF DISCHARGE:                                 CONSULTATION   DATE OF CONSULTATION:  October 31, 2006.   REASON FOR CONSULTATION:  Acute attack of gout.   IMPRESSION AND RECOMMENDATIONS:  1. Acute gout attack, right knee, and to a lesser extent first      metatarsophalangeal:  The patient is currently getting colchicine      twice a day as needed.  He has had no diarrhea.  He usually takes      it every four hours for diarrhea at home when he has an acute      attack.  I will change his colchicine to q.i.d. until resolution or      diarrhea.  Also, he is quite bothered by the recurrent nature of      his gout.  Apparently, he has a flare-up every three months or so      and has not found allopurinol to be of much help.  Therefore, I      recommend colchicine maintenance therapy once or twice a day.  For      now, I will ask that colchicine be changed to once a day after his      acute attack has resolved.  His creatinine is normal.  He has no      nonsteroidal anti-inflammatory allergy and I will also give him two      doses of indomethacin, as discussed earlier with Dr. [name].  2. History of congestive heart failure secondary to nonischemic      cardiomyopathy.  3. Left bundle branch block.  4. Obstructive sleep apnea.  5. Hypertension.  6. Ventricular tachycardias status post cardioversion.   HISTORY OF PRESENT ILLNESS:  [name] is a pleasant 46-year black  male patient of Dr. [name] whose primary care physician is Dr. Catha Gosselin.  He has been in the hospital since October 27, 2006 with the  above problems.  He began having an acute attack of gout of the right  knee a few days ago.  It is somewhat  better, but I am asked to consult  for further recommendations.  The patient is currently eating well and  has no diarrhea.  He is having a difficult time ambulating secondary to  the gout.  Apparently, his gout is usually managed by Dr. Clarene Duke.   PAST MEDICAL HISTORY:  As above.   MEDICATIONS:  1. Colchicine 0.6 mg p.o. b.i.d. as needed.  2. Aspirin 325 mg a day.  3. Lanoxin 0.25 mg a day.  4. Captopril 6.25 mg p.o. every 8 hours.  5. Spironolactone 25 mg a day.  6. Simvastatin 80 mg a day.  7. Lasix 40 mg IV every 12 hours.  8. Sliding scale insulin.  9. Nitroglycerin ointment 1/2 inch every 6 hours.  10.Amiodarone  drip.   ALLERGIES:  No known drug allergies.   SOCIAL HISTORY:  The patient lives in [place].  He does not drink or  smoke.   FAMILY HISTORY:  Reviewed and as per H&P.   REVIEW OF SYSTEMS:  As above.   PHYSICAL EXAMINATION:  VITAL SIGNS:  His temperature is 98.4.  Heart  rate 94.  Blood pressure 126/67.  Respiratory rate 18.  Oxygen  saturation 97% on room air.  CBG 117.  GENERAL:  The patient is a comfortable appearing black male.  HEENT:  He has a deformity of the right eyelid which is old.  Moist  mucous membranes.  NECK:  Supple.  LUNGS:  Clear to auscultation bilaterally without wheezes, rhonchi or  rales.  CARDIOVASCULAR:  Regular rate and rhythm without murmurs, gallops or  rubs.  ABDOMEN:  Normal bowel sounds.  Soft and nontender.  GU:  Deferred.  RECTAL:  Deferred.  EXTREMITIES:  His right knee and has an effusion and is warm.  Decreased  range of motion secondary to pain.  He also has some tenderness over the  right MTP joint.  NEUROLOGIC:  Alert and oriented.  Cranial nerves and sensorimotor exam  are intact.  PSYCHIATRIC:  Normal affect.  SKIN:  No rash.   LABORATORY DATA:  White blood cell count is 12,000; otherwise,  unremarkable CBC.  Sodium 134, potassium 3.9, chloride 95, bicarbonate  29, glucose 107, BUN 23, creatinine 1.35.  Chest  x-ray on October 27, 2006 showed right worsening air space disease, especially on the left,  likely worsening edema.   I would like to thank Dr. [name] for this consultation.  Dr. Dartha Lodge will  follow in the morning.      Corinna L. Lendell Caprice, MD  Electronically Signed     CLS/MEDQ  D:  10/31/2006  T:  11/01/2006  Job:  [id number]   cc:   Vesta Mixer, M.D.  Anna Genre Little, M.D.

## 2010-07-20 NOTE — Discharge Summary (Signed)
NAME:  [name], [name]              ACCOUNT NO.:  [id number]   MEDICAL RECORD NO.:  [id number]           PATIENT TYPE:   LOCATION:                                 FACILITY:   PHYSICIAN:  Hillis Range, MD       DATE OF BIRTH:  [birth year]   DATE OF ADMISSION:  DATE OF DISCHARGE:                               DISCHARGE SUMMARY   ADDENDUM   Just prior to discharge, Mr. Saye was noted to have an apparent WCT  on the monitor.  Of note, however, he remained completely  hemodynamically stable, asymptomatic, and reported no firing of his  defibrillator.  The nursing staff was quite prompt in further clarifying  this by placing Zoll pads on him, which immediately identified the  rhythm as actually being normal sinus.  This was then confirmed by a  followup EKG, which was brought to my attention, and which verified  normal sinus rhythm.  Therefore, the preceding waveform was felt to be  artifactual, and the patient was reassured regarding this finding.      Gene Serpe, PA-C      Hillis Range, MD  Electronically Signed    GS/MEDQ  D:  04/12/2008  T:  04/12/2008  Job:  [id number]

## 2010-07-20 NOTE — Letter (Signed)
March 11, 2008    Vesta Mixer, M.D.  [address], [address]  [place], [place]  [address]   RE:  [name]  MRN:  [id number]  /  DOB:  26-Dec-1960   Dear [name],   It was a pleasure seeing [name] today in follow-up of his CPX.  As you know, he has nonischemic cardiomyopathy and has had waxing and  waning symptoms and had a CPX before his ICU was implanted that had  really very good peak PO2.  Unfortunately, this one is 9.9 which is very  limiting.  It is further limited by his peak heart rate which is 88  giving him chronotropic incompetence likely secondary to the combination  of amiodarone and carvedilol.   His ICD implanted previously is functioning normally.  He has had no  ventricular tachycardia in about 15 months.   He has had problems with progressive shortness of breath.  He apparently  called your office and was told to take some diuretics parenthetically.  They are not on his MAR now.  This was without improvement.  He went  Urgent Care who referred him over to [facility].  A CT scan was done that  demonstrated a retrocardiac infiltrate that was thought to be pneumonia.  He was treated with inhalers and antibiotics.   MEDICATIONS:  1. Amiodarone.  2. Lanoxin 0.25.  3. Lipitor.  4. Spironolactone 25.  5. Lisinopril 40.  6. Carvedilol 25 b.i.d.   PHYSICAL EXAMINATION:  VITAL SIGNS:  Blood pressure is 112/74 with a  pulse of 87, weight was 239 which is stable.  NECK:  Veins were seven.  LUNGS:  Clear.  HEART:  Sounds were regular without murmurs or gallops.  ABDOMEN:  Soft but protuberant.  EXTREMITIES:  Without edema.   LABORATORY DATA:  His CPX was as noted above with a pCO2 of 9.9.  A max  heart rate of 88.  An RER of 1.12 indicating a maximal effort.   IMPRESSION:  1. Nonischemic cardiomyopathy.  2. Class III congestive heart failure.  3. Previously implanted ICD.  4. History of ventricular tachycardia on amiodarone.  5. Retrocardiac  infiltration, question pneumonia, and in my mind      question amiodarone.   [name], [name] has progressive symptoms of congestive heart failure  in the context of his nonischemic myopathy and may well benefit from CRT  upgrade.  We have discussed this extensively including the benefits and  risks not limited to infection.  He understands these risks and would  like to proceed.   I would like to make sure the pulmonary process is cleared up.  To that  end, we have scheduled procedure at the end of the month and will  undertake a CT scan prior to that to make sure the aforementioned  pulmonary process has resolved.  The event that it has not, PFTs of the  DLCO would be important to consider amiodarone lung toxicity.    Sincerely,      Jamie Salvia, MD, [facility]  Electronically Signed    SCK/MedQ  DD: 03/11/2008  DT: 03/11/2008  Job #: [id number]

## 2010-07-20 NOTE — H&P (Signed)
NAMELYMAN, BALINGIT              ACCOUNT NO.:  [id number]   MEDICAL RECORD NO.:  [id number]          PATIENT TYPE:  INP   LOCATION:  2018                         FACILITY:  [facility]   PHYSICIAN:  Vesta Mixer, M.D. DATE OF BIRTH:  1960-03-28   DATE OF ADMISSION:  12/08/2006  DATE OF DISCHARGE:                              HISTORY & PHYSICAL   The patient is a 50 year old gentleman with a history of congestive  heart failure.  He is admitted with an AICD defibrillation.   The patient has a long history of congestive heart failure.  He has an  ejection fraction of around 10-15%.  He was admitted approximately one  month ago with an episode of ventricular tachycardia.  He received IV  Cardizem for what was thought to be a supraventricular tachycardia.  He  actually had ventricular tachycardia and required defibrillation.  He  had severe decompensation of his congestive heart failure for several  days and gradually improved with a milrinone drip.  We gradually added  back all of his medications.  Cardiac catheterization revealed no  significant coronary artery irregularities.  He had an AICD placed by  Dr. Berton Mount.  He has done fairly well.  He was seen today in the  AICD Clinic and was found to have had multiple episodes of ventricular  tachycardia that were pace terminate.  In addition, he had a  defibrillation yesterday that he was aware of.  He is now admitted for  amiodarone loading.   The patient has overall done fairly well.  He has been getting his  normal activities but he really does not get any regular exercise.   CURRENT MEDICATIONS:  1. Zocor 80 mg a day.  2. Amlodipine 2.5 mg a day.  3. Spironolactone 25 mg a day.  4. Lisinopril 40 mg a day.  5. Lanoxin 0.25 mg a day.  6. Multivitamin once a day.  7. He previously was on Coreg CR 80 mg a day, but somehow he      discontinued this medication.   ALLERGIES:  None.   PAST MEDICAL HISTORY:  1. History of a  dilated cardiomyopathy with an ejection fraction of 10-      15%.  2. History of ventricular tachycardia - status post AICD.  3. History of normal coronary arteries.  4. History of gallops.   SOCIAL HISTORY:  The patient is a nonsmoker and nondrinker.  He works as  a [employer] manager.   FAMILY HISTORY:  Noncontributory.   REVIEW OF SYSTEMS:  Reviewed and is essentially negative except as noted  in the HPI.   PHYSICAL EXAMINATION:  GENERAL APPEARANCE:  He is a young gentleman in  no acute distress.  He is alert and oriented x3.  His mood and affect  are normal.  VITAL SIGNS:  His weight is 236, blood pressure 110/80 with heart rate  of 84.  HEENT:  Exam reveals 2+ carotids, no bruits, no JVD, no thyromegaly.  LUNGS:  Clear.  HEART:  Regular rate S1-S2.  ABDOMEN:  Exam reveals good bowel sounds and is nontender.  EXTREMITIES:  No clubbing, cyanosis or edema.  NEUROLOGIC:  Exam was nonfocal.   His laboratory data is pending.   IMPRESSION AND PLAN:  1. Ventricular tachycardia.  The patient has a known idiopathic      dilated cardiomyopathy.  He had ventricular tachycardia a month ago      and had an automatic implanted cardioverter defibrillator placed.      He now presents after having a defibrillator discharge.  We will      start him with amiodarone 400 mg three times a day.  We will get an      EKG each day.   It is possible that his discontinuation of carvedilol may have  contributed to his ventricular tachycardia but it is hard to conclude  that this was the sole cause of his VT.  Will make sure that he gets  back on carvedilol.  I have started him on a slightly reduced dose just  to restart it slowly.   Patient has a history of becoming confused on his medications and will  have to continue to be careful that he stays on his medications.  1. Gout.  The patient has a history of gout especially after receiving      intravenous Lasix.  We will provide him some p.o.  colchicine as      needed.  2. Dyslipidemia - stable.           ______________________________  Vesta Mixer, M.D.     PJN/MEDQ  D:  12/08/2006  T:  12/10/2006  Job:  [id number]

## 2010-07-20 NOTE — Op Note (Signed)
NAMETHERRON, SELLS              ACCOUNT NO.:  [id number]   MEDICAL RECORD NO.:  [id number]          PATIENT TYPE:  INP   LOCATION:  2038                         FACILITY:  [facility]   PHYSICIAN:  Duke Salvia, MD, FACCDATE OF BIRTH:  1960/10/05   DATE OF PROCEDURE:  11/03/2006  DATE OF DISCHARGE:  11/04/2006                               OPERATIVE REPORT   PREOPERATIVE DIAGNOSIS:  Nonischemic cardiomyopathy, ventricular  tachycardia.   POSTOPERATIVE DIAGNOSIS:  Nonischemic cardiomyopathy, ventricular  tachycardia.   PROCEDURE:  Single-chamber defibrillator implantation with  intraoperative defibrillation threshold testing.   Following obtaining informed consent, the patient was brought to the  electrophysiology laboratory and placed on the fluoroscopic table in  supine position.  After routine prep and drape of the left upper chest,  lidocaine was infiltrated in the prepectoral subclavicular region.  Incision was made and carried down to the layer of the prepectoral  fascia using electrocautery and sharp dissection.  A pocket was formed  similarly.  Hemostasis was obtained.   We then obtained access to the left subclavian vein and a 9-French  sheath was placed, through which was passed a Medtronic 6947 lead,  serial number [id number].  Under fluoroscopic guidance, it was moved to  the right ventricle where the bipolar R wave was 30 with a pace  impedance of 766 ohms, a threshold 0.7 volts at 0.5 milliseconds.  Current threshold 0.9 MA.  There was no diaphragmatic pacing at 10 volts  and the current of injury was brisk.  The lead was then secured to a  Medtronic Virtuoso [id number] defibrillator, serial number [id number].  Through the device, the bipolar R wave was 20 with a pace impedance of  640, threshold of 1 volt at 0.4, the high-voltage impedance of 60/59.   Defibrillation threshold testing was then undertaken.  Ventricular  fibrillation was induced via T-wave shock.  After a  total duration of 7  seconds, a 10-joule shock was delivered through measured resistance of  48 ohms terminated ventricular fibrillation, restoring sinus rhythm.   After a wait of 5-6 ventricular fibrillation was reinduced via the T-  wave shock.  After a total duration of 6 seconds a 15-joule shock was  delivered through a measured resistance of 47 ohms, failing to terminate  ventricular fibrillation.  After a total duration of 13 seconds, a 25-  joule shock was delivered through a measured resistance of 47 ohms  terminating ventricular fibrillation and restoring sinus rhythm.  At  this point, the device was implanted.  The pocket was copiously  irrigated with antibiotic-containing saline solution.  Hemostasis was  assured and leads and pulse generator were placed in the pocket and  secured to the prepectoral fascia.  The wound was closed in two layers  in the normal fashion.  The wound was washed, dried and Benzoin and  Steri-Strip dressing was applied.  Needle counts, sponge counts and  instrument counts were correct at the end of the procedure according to  the staff.      Duke Salvia, MD, [facility]  Electronically Signed    SCK/MEDQ  D:  04/12/2007  T:  04/13/2007  Job:  [id number]

## 2010-07-20 NOTE — Discharge Summary (Signed)
[name], [name]              ACCOUNT NO.:  [id number]   MEDICAL RECORD NO.:  [id number]          PATIENT TYPE:  INP   LOCATION:  2028                         FACILITY:  [facility]   PHYSICIAN:  Vesta Mixer, M.D. DATE OF BIRTH:  01-21-1961   DATE OF ADMISSION:  12/08/2006  DATE OF DISCHARGE:  12/12/2006                               DISCHARGE SUMMARY   DISCHARGE DIAGNOSIS:  1. Ventricular tachycardia.  2. Status post AICD placement.  3. History of congestive heart failure with an ejection fraction of 10-      15%.  4. History of gout.  5. History of hyperlipidemia.   DISCHARGE MEDICATIONS:  1. Amiodarone 400 mg twice daily for 3 weeks and then 400 mg a day.      We will reduce him to 200 mg a day shortly thereafter.  2. Lipitor 40 mg a day.  3. Carvedilol 12.5 mg twice a day.  4. Lisinopril 40 mg a day.  5. Digoxin     0.25 mg a day.  6. Furosemide 40 mg a day only as needed.  7. Colchicine 0.6 mg twice a day as needed.   DISPOSITION:  The patient will see Dr. [name] in approximately 1-2  weeks.  He is to see Dr. Graciela Husbands or Ladona Ridgel as needed.   He has been instructed to stop his amlodipine, simvastatin and Coreg CR.   HISTORY:  [name] is a 50 year old gentleman with a history of  congestive heart failure. He was admitted to the hospital after having  recurrent episodes of ventricular fibrillation.  He was admitted for  amiodarone loading.   Please see dictated H&P for further details.   HOSPITAL COURSE BY PROBLEM:  1. Ventricular tachycardia.  The patient was seen in pacer clinic at      the Dr. Odessa Fleming office.  He was found to have numerous episodes of      ventricular tachycardia including episodes that were pace      terminated and one episode that required an AICD shock.  He was      fairly stable and was admitted to hospital for amiodarone loading.      He had no complications with his amiodarone loading and is now      discharged in satisfactory  condition.  2. Congestive heart failure.  The patient was continued on his heart      failure medications.  It is apparent that he eats a lot less salt      in the hospital and he became somewhat over diuresed.  His      creatinine rose steadily for a couple days and we held his Lasix.      We will send him home on Lasix on an as-needed basis.  I suspect      that he will need it several times a week depending on his diet.  3. Hyperlipidemia.  The patient was previously on Zocor.  Because of      the interaction of Zocor and amiodarone,      we have changed him to Lipitor 40 mg a day.  We will follow up with      him in the office.  4. We have also changed his Coreg CR to carvedilol twice a day      medication.  5. Gout - stable.           ______________________________  Vesta Mixer, M.D.     PJN/MEDQ  D:  12/12/2006  T:  12/12/2006  Job:  [id number]   cc:   Duke Salvia, MD, [facility]  Holley Bouche, M.D.

## 2010-07-20 NOTE — Letter (Signed)
February 21, 2007    Vesta Mixer, M.D.  [address], [address]  [place], [place] [address]   RE:  [name]  MRN:  [id number]  /  DOB:  Nov 11, 1960   Dear [name],   [name] comes in, doing pretty well.  He has ventricular  tachycardia.  He is status post ICD in the context of his non-ischemic  heart disease.   His blood pressure is quite elevated today at 150/98.   MEDICATIONS:  1. Carvedilol 12.5 b.i.d.  2. Amiodarone 200 twice daily.  3. Lisinopril 40.  4. Spironolactone, Lipitor, and colchicine.   PHYSICAL EXAMINATION:  LUNGS:  Clear.  CARDIAC:  Heart sounds were regular.  EXTREMITIES:  Without edema.   Interrogation of his Medtronic ICD demonstrates an R wave of 19.6 with  an impedance of 472 and a threshold of 1 volt at 0.4.  High voltage  impedances were 47/60.  Battery voltage was 3.21.  There are no  intercurrent episodes.   IMPRESSION:  1. Ventricular tachycardia.  2. Nonischemic cardiomyopathy.  3. Status post implantable cardioverter/defibrillator for the above.  4. Amiodarone for the above.  5. Hypertension.  6. Possible sleep apnea with a sleep study pending.   Phil, I took the liberty of increasing [name] carvedilol today  to 18.75.  He is supposed to see you again in a couple of weeks and hope  that that was all right.   I need to touch base with you also to make sure we are going his  amiodarone surveillance labs.  I have given him a card, actually, so  that we can track which of [place] is doing that.   Thank you very much for allowing me to participate in his care.    Sincerely,      Jamie Salvia, MD, [facility]  Electronically Signed    SCK/MedQ  DD: 02/21/2007  DT: 02/21/2007  Job #: [id number]

## 2010-07-20 NOTE — Assessment & Plan Note (Signed)
[facility]                         ELECTROPHYSIOLOGY OFFICE NOTE   NAME:Ewbank, AZRAEL MADDIX                     MRN:          [id number]  DATE:11/16/2006                            DOB:          21-Jul-1960    Mr. Bastin was seen today in the device clinic for follow-up of his  newly-implanted Medtronic Virtuoso ICD, model number [id number], implanted  on November 02, 2006, for VT and nonischemic cardiomyopathy.  Interrogation of his device demonstrates R waves of 19.6 mV with an RV  impedance of 488 Ohms and a threshold of 1 V at 0.4 msec.  His shock  impedances were 42 and 50 Ohms.  His battery voltage was 3.21 V with a  charge time of 8.9 seconds.  He was in sinus tachycardia today.  He had  not had any episodes of any arrhythmias since last interrogation.  Upon  arrival he is programmed for a VF only event of 200 beats per minute.  Per Dr. Odessa Fleming request we have reprogrammed him to a two-zone device  with a VT-1 mode of 188 beats per minute with ATP while charging turned  on.  Dr. [name] is Mr. Coury primary cardiologist.  He will return  to clinic in 3 months to see Dr. Graciela Husbands.  His Steri-Strips were removed  today.  He did have some superficial oozing.  Pressure was held and this  stopped.  Mr. Lewinski was instructed to call us if this became more of  a problem.      Gypsy Balsam, RN,BSN  Electronically Signed      Duke Salvia, MD, [facility]  Electronically Signed   AS/MedQ  DD: 11/16/2006  DT: 11/17/2006  Job #: [id number]

## 2010-07-20 NOTE — H&P (Signed)
[name], [name]              ACCOUNT NO.:  [id number]   MEDICAL RECORD NO.:  [id number]          PATIENT TYPE:  INP   LOCATION:  2914                         FACILITY:  [facility]   PHYSICIAN:  Bevelyn Buckles. Bensimhon, MDDATE OF BIRTH:  07/18/1960   DATE OF ADMISSION:  10/27/2006  DATE OF DISCHARGE:                              HISTORY & PHYSICAL   CARDIOLOGIST:  Dr. Deloris Ping. Nahser.   REASON FOR ADMISSION:  Fast ventricular tachycardia.   HISTORY OF PRESENT ILLNESS:  [name] is a 50 year old male with a  history of congestive heart failure secondary to nonischemic  cardiomyopathy with an EF of 15% to 20%.  He also has a left bundle  branch block, obesity, obstructive sleep apnea and hypertension.  He  underwent cardiac catheterization in 1999 by Dr. [name], which showed  normal coronary arteries and an EF of 15% to 20%.  Subsequently, he has  been followed by Dr. [name] and Dr. Jerrol Banana in the [facility].  He previously was recommended to have an ICD and  resynchronization device, but he refused.  At baseline, he is NYHA class  II.   According to his wife, tonight he was at a meeting when he began to feel  dizzy and weak.  He drove home and he continued to feel weak and clammy.  His wife tried to get him to come to the emergency room, but he insisted  on taking a shower.  Finally, she drove him from [facility] to the  emergency room, where he arrived around 12:50 in the morning.  On  arrival, he was in a wide complex tachycardia at a rate of 230 beats per  minute.  Blood pressure was preserved.  He was seen by Dr. Verlan Friends who  felt the rhythm was an SVT/atrail flutter he was given 20 mg IV  diltiazem and adenosine at 6mg  followed by 12mg , which did not change or  slow the rhythm significantly. He was then given another 35 mg of IV  diltiazem.  Again, there was no change in his rate or rhythm.  I was  contacted at 1:25 a.m. by telephone and suggested  cardioversion.  Then,  at 1:50 a.m., he was sedated and underwent direct-current cardioversion  with a 50-joule biphasic shock delivered in a synchronized fashion with  reversion to sinus tachycardia.  Post cardioversion, he developed acute  heart failure with oxygen saturations in the low 80s.  He was markedly  orthopneic.  He was placed on BiPAP and treated with morphine and Lasix.  His SATs then came up to about 94%.   REVIEW OF SYSTEMS:  Unavailable from him, but according to his wife, he  has not had any chest pain, no significant lower extremity edema, no  orthopnea and no PND.  He has not recently been ill with any nausea,  vomiting or upper respiratory tract symptoms.  He has not had any  bleeding.  The remainder of the review of systems is negative except for  HPI and problem list.   PROBLEM LIST:  1. Chronic systolic heart failure secondary to  nonischemic      cardiomyopathy with an EF of 10% to 15%.      a.     Cardiac catheterization with normal coronary arteries in       1999.      b.     Followed at [facility] and previously refused       ICD.  2. Obesity.  3. Hypertension.  4. Obstructive sleep apnea.  5. Chronic left bundle branch block.   CURRENT MEDICATIONS:  1. Lisinopril 40 mg a day.  2. Amlodipine 5 mg a day.  3. Simvastatin 80 mg a day.  4. Spironolactone 25 mg a day.   It does not appear that he was on a beta blocker.   ALLERGIES:  He has no known drug allergies.   SOCIAL HISTORY:  He lives with his wife in [place].  He works as a  Retail buyer.  No history of tobacco or alcohol use.   FAMILY HISTORY:  He is an only child.  Both mother and father are  passed; his mother died of pneumonia; unclear from what his father died.   PHYSICAL EXAMINATION:  GENERAL:  He is on BiPAP.  He is somewhat  somnolent.  VITAL SIGNS:  Blood pressure is 170/80.  Heart rate is 100.  Respirations are about 25, but much more comfortable now,  saturating  94%.  HEENT:  His left conjunctivae is mildly injected, otherwise normal.  NECK:  Supple and thick; it is hard to assess his JVD, but it does  appear elevated.  Carotids are 1+ bilaterally with no bruits.  There is  no lymphadenopathy or thyromegaly.  CARDIAC:  His PMI is laterally displaced.  He has very distant heart  sounds.  He is tachycardic.  No obvious murmur, probable S3.  LUNGS:  Diffuse crackles about halfway up bilaterally.  ABDOMEN:  Obese, nontender and non-distended.  No hepatosplenomegaly, no  bruits and no masses appreciated.  EXTREMITIES:  Warm now with no cyanosis, clubbing or edema.  Distal  pulses are 1+.  There is no rash.  NEUROLOGIC:  He is alert, but somnolent.  He follows commands and can  answer questions.  Cranial nerves are grossly intact.  He moves all 4  extremities.   LABORATORY AND ACCESSORY CLINICAL DATA:  Sodium is 140, potassium 4, BUN  12, creatinine 1.4.  CK-MB on point of care is 14.4 with a troponin of  0.45.   ASSESSMENT:  1. Fast ventricular tachycardia, now status post cardioversion to      sinus tachycardia.  2. Acute on chronic systolic heart failure.  3. Nonischemic cardiomyopathy with an EF of 10% to 15%.  4. Left bundle branch block.   PLAN/DISCUSSION:  We will admit him to the CCU.  We will start  amiodarone and check a magnesium level.  He will need diuresis as  tolerated and support his respiratory status with BiPAP.  I suspect the  VT is a primarily electrical event, but given that he has not had a  catheterization in almost 10 years, he will probably need cardiac  catheterization followed by a BiV ICD.  We will hold beta blocker until  respiratory status is more stable.  Remainder of the plan per Dr.  [name].     Bevelyn Buckles. Bensimhon, MD  Electronically Signed    DRB/MEDQ  D:  10/27/2006  T:  10/27/2006  Job:  [id number]

## 2010-07-20 NOTE — Assessment & Plan Note (Signed)
[facility]                         ELECTROPHYSIOLOGY OFFICE NOTE   NAME:STREETERMahdi, Frye                     MRN:          [id number]  DATE:10/23/2007                            DOB:          1961-02-08    Mr. [name] is seen in followup for ventricular tachycardia,  nonischemic cardiomyopathy, and is status post ICD implantation  implanted about a year ago.  He has had no intercurrent ventricular  tachycardia.  He is feeling quite well except for fatigue.  He underwent  a sleep study, but he has not been able to tolerate wearing a mask.   He is not sure if any of his amiodarone surveillance labs have been  drawn.   His medications include:  1. Amiodarone.  2. Carvedilol 25 b.i.d.  3. Lanoxin 0.25.  4. Lipitor.  5. Spironolactone.  6. Lisinopril 40.   On examination, his blood pressure is a little bit low at 99/62 with a  pulse of 62.  His neck veins are flat.  His lungs are clear.  Heart  sounds are regular.  His skin is warm and dry.  There is no peripheral  edema.  He is in no acute distress.   Interrogation of his device demonstrated an R-wave of 19 with impedance  of 480, threshold of 1 volt at 0.4, high voltage impedance of 51 ohms,  and battery voltage is 3.19.  There are no intercurrent therapies.   IMPRESSION:  1. Nonischemic cardiomyopathy.  2. Ventricular tachycardia.  3. Status post implantable cardioverter-defibrillator for the above.   Mr. Steib is doing well.  We will check his amiodarone surveillance  labs today.  I will plan to give him an amiodarone surveillance card to  help [place] follow this up.     Duke Salvia, MD, [facility]  Electronically Signed    SCK/MedQ  DD: 10/23/2007  DT: 10/24/2007  Job #: [id number]   cc:   Caryn Bee L. Little, M.D.  Vesta Mixer, M.D.

## 2010-07-20 NOTE — Cardiovascular Report (Signed)
NAMEMarland Kitchen  [name], [name] NO.:  [id number]   MEDICAL RECORD NO.:  [id number]           PATIENT TYPE:   LOCATION:                                 FACILITY:   PHYSICIAN:  Vesta Mixer, M.D. DATE OF BIRTH:  [birth year]   DATE OF PROCEDURE:  04/11/2008  DATE OF DISCHARGE:                            CARDIAC CATHETERIZATION   [name] is a middle-aged gentleman with a history of congestive  heart failure.  He was admitted yesterday after having shortness of  breath on the cath table.  He was actually here to have his AICD  upgraded to a biventricular AICD.  He was admitted by Dr. Graciela Husbands with the  idea that we would transfer him to my service today.   [name] feels quite a bit better this morning.  He diuresed a  little bit overnight.  [name] thinks that most of his problems  were due to postnasal drip.   The procedure was a right heart catheterization.   The right femoral vein was easily cannulated using the modified  Seldinger technique.   HEMODYNAMICS:  The RA pressure is 6, RV pressure is 35/5, the pulmonary  capillary wedge pressure is 7, pulmonary artery pressure is 39/7 with a  mean pressure of 23.  His cardiac output by thermodilution is 4.6 L per  minute with an index of 2.04 L per minute.  His Fick cardiac output is  4.74 L per minute with a cardiac index of 2.08.   His O2 saturation was 92% by pulse oximetry and this was used for our  arterial saturation.   COMPLICATIONS:  None.   CONCLUSION:  1. Normal filling pressures.  2. He appears to be stable and does not have any evidence of worsening      congestive heart rate.      Vesta Mixer, M.D.  Electronically Signed     PJN/MEDQ  D:  04/11/2008  T:  04/11/2008  Job:  [id number]   cc:   Duke Salvia, MD, [facility]

## 2010-07-20 NOTE — Cardiovascular Report (Signed)
NAMEAUDON, HEYMANN NO.:  [id number]   MEDICAL RECORD NO.:  [id number]          PATIENT TYPE:  INP   LOCATION:  2914                         FACILITY:  [facility]   PHYSICIAN:  Vesta Mixer, M.D. DATE OF BIRTH:  Jul 12, 1960   DATE OF PROCEDURE:  10/30/2006  DATE OF DISCHARGE:                            CARDIAC CATHETERIZATION   [name] is a 49 year old gentleman with a history idiopathic  dilated cardiomyopathy.  He has been on maximal medical therapy.  He has  done fairly well over the years.  He has been seen at [facility].  He had cardiopulmonary stress testing, and he has  overall done fairly well.  We have continued with medical management.  We have referred him in the past to Dr. Graciela Husbands for consideration for  defibrillator placement.  Jahmeir did not want to have a defibrillator  placed.   He presented to the hospital on August 22 in incessant VT.  He was  cardioverted successfully.  This was complicated by congestive heart  failure.  It should be noted that he received IV Cardizem an adenosine  in the emergency room prior to his cardioversion.  He now is referred  for heart catheterization for further evaluation of his LV function and  coronary status prior to placing an ICD.   The right femoral artery and right femoral vein were easily cannulated  using modified Seldinger technique.   HEMODYNAMIC RESULTS:  LV pressure was 116/2 with an aortic pressure of  116/76.   Right atrial pressure is 8. Right ventricular pressures 38/4. Pulmonary  artery pressure is 37/24 with a mean of 29. Pulmonary capillary wedge  pressure is 12.  There was no mitral valve gradient on simultaneous  wedge and LV end-diastolic pressures.   His cardiac output by thermodilution is 4.9 with an index of 2.18.   ANGIOGRAPHY:  The left main: The left main is smooth and normal.   The left anterior descending artery is very large and smooth and normal.  There are two large diagonal branches which are also smooth and normal.   Left circumflex artery is large and normal.   The right coronary artery is large and normal.  It is dominant.   Left ventriculogram reveals a markedly dilated left ventricle.  There is  moderate to severe left ventricular dysfunction with an ejection  fraction of 25%.  This is actually a little bit better than his  echocardiogram reading of around 10-15%.   COMPLICATIONS:  None.   CONCLUSION:  1. Smooth and normal coronary arteries.  2. Moderate to severe left ventricular dysfunction.  He appears to be      adequate tuned up.  His filling pressures were not all that high.      We will continue with medical therapy and continue to diurese him.      He will  place an AICD in the next several days.           ______________________________  Vesta Mixer, M.D.     PJN/MEDQ  D:  10/30/2006  T:  10/30/2006  Job:  [id number]   cc:   Dr. Aundria Rud, [facility]  Duke Salvia, MD, [facility]

## 2010-07-20 NOTE — Op Note (Signed)
[name], ROBISON              ACCOUNT NO.:  [id number]   MEDICAL RECORD NO.:  [id number]          PATIENT TYPE:  OIB   LOCATION:  3304                         FACILITY:  [facility]   PHYSICIAN:  Newman Pies, MD            DATE OF BIRTH:  Jun 06, 1960   DATE OF PROCEDURE:  DATE OF DISCHARGE:                               OPERATIVE REPORT   PREOPERATIVE DIAGNOSES:  1. Nasal septal deviation.  2. Bilateral inferior turbinate hypertrophy.  3. Chronic nasal obstruction.   POSTOPERATIVE DIAGNOSES:  1. Nasal septal deviation.  2. Bilateral inferior turbinate hypertrophy.  3. Chronic nasal obstruction.   PROCEDURE PERFORMED:  1. Septoplasty.  2. Bilateral partial inferior turbinate resection.   ANESTHESIA:  General endotracheal tube anesthesia.   COMPLICATIONS:  None.   ESTIMATED BLOOD LOSS:  Less than 50 mL.   INDICATIONS FOR PROCEDURE:  The patient is a 50 year old male with a  history of chronic nasal obstruction and chronic sinus drainage.  The  patient was previously treated with Veramyst nasal spray, nasal saline  irrigation, over-the-counter decongestant, and antihistamines.  However,  he continues to be symptomatic.  Based on the above findings, the  decision was made for the patient to undergo a septoplasty and bilateral  partial inferior turbinate resection.  The risks, benefits,  alternatives, and details of the procedures were discussed with the  patient.  Questions were invited and answered.  Informed consent was  obtained.   DESCRIPTION:  The patient was taken to the operating room and placed  supine on the operating table.  General endotracheal tube anesthesia was  administered by the anesthesiologist.  Preop IV antibiotics was given.  The patient was positioned and prepped and draped in a standard fashion  for nasal surgery.  Pledgets soaked with Afrin were placed in both nasal  cavities for vasoconstriction.  The pledgets were subsequently removed.  Lidocaine 1% with  1:100,000 epinephrine were injected onto the nasal  septum and the lateral nasal wall.  A standard hemitransfixion incision  was made via the left nostril.  The mucosal flap was elevated on the  left side in a standard fashion.  A cartilage incision was made 1 cm  superior to the caudal margin of the nasal septum.  The mucosal flap was  elevated on the contralateral side without difficulty.  The deviated  portion of the cartilaginous and bony nasal septum were removed.  The  cartilage was morselized and replaced.  The septum was quilted with 4-0  plain gut sutures.  The hemitransfixion incision was closed with  interrupted chromic sutures.  Attention was then focused on the  turbinates.  The inferior on-half of each inferior turbinate was clamped  with a straight Kelly clamp and resected with a cross-cutting scissors.  Hemostasis was achieved with the suction electrocautery.  That concluded  procedure for the patient.  The care of the patient was turned over to  the anesthesiologist.  The patient was awakened from anesthesia without  difficulty.  She was extubated and transferred to the recovery room in  good condition.  OPERATIVE FINDINGS:  1. Nasal septal deviation.  2. Bilateral inferior turbinate hypertrophy.   SPECIMEN REMOVED:  None.   FOLLOWUP CARE:  The patient will be discharged home once he is awake and  alert.  He will be placed on Vicodin and Keflex for 5 days.  The patient  will follow up in my office next week for removal of his nasal splint.      Newman Pies, MD  Electronically Signed     ST/MEDQ  D:  06/12/2008  T:  06/13/2008  Job:  [id number]

## 2010-07-23 NOTE — Procedures (Signed)
NAME:  [name], GONGAWARE              ACCOUNT NO.:  [id number]   MEDICAL RECORD NO.:  [id number]          PATIENT TYPE:  OUT   LOCATION:  SLEEP CENTER                 [facility]ACILITY[facility]  [facility]   PHYSICIAN:  Clinton D. Maple Hudson, M.D. DATE OF BIRTH:  1961/01/29   DATE OF STUDY:  05/02/2005                              NOCTURNAL POLYSOMNOGRAM   REFERRING PHYSICIAN:  Dr. Hermelinda Medicus.   DATE OF STUDY:  May 02, 2005.   INDICATION FOR STUDY:  Hypersomnia with sleep apnea. Epworth sleepiness  score 9/24, BMI 34, weight 240 pounds. Home medications: Coumadin,  potassium, Prinivil, Coreg, Lasix, Lanoxin. Neck size 18-1/2 inches.   SLEEP ARCHITECTURE:  Total sleep time 264 minutes with sleep efficiency 63%.  Stage I was 13%, stage II 80%, stages III and IV were absent, REM was 7% of  total sleep time. Sleep latency 16 minutes, REM latency 322 minutes, awake  after sleep onset 141 minutes, arousal index increased at 28.2.   RESPIRATORY DATA:  NPSG protocol requested. Apnea-hypopnea index (AHI, RDI)  20.2 obstructive events per hour indicating moderately severe obstructive  sleep apnea-hypopnea syndrome. This reflected 12 obstructive apneas and 77  hypopneas. He slept almost exclusively on his right side and all events were  recorded in that position. REM AHI 25.9 per hour.   OXYGEN DATA:  Mild to moderate snoring with oxygen desaturation to a nadir  of 85%. Mean oxygen saturation through the study was 93% on room air.   CARDIAC DATA:  Sinus rhythm with frequent PVCs.   MOVEMENT/PARASOMNIA:  Occasional leg jerk with little effect on sleep.   IMPRESSION/RECOMMENDATION:  1.  Moderate obstructive sleep apnea/hypopnea syndrome, apnea-hypopnea index      20.2 per hour with all events while sleeping on right side making change      of sleep position unlikely to be helpful. Moderate snoring with oxygen      desaturation to a nadir of 85%.  2.  Consider return for continuous positive airway pressure  titration or      evaluate for alternative therapies as appropriate.  3.  Difficulty maintaining sleep with the patient awake roughly from 1 until      3 a.m. and described as restless during the night.      Clinton D. Maple Hudson, M.D.  Diplomate, Biomedical engineer of Sleep Medicine  Electronically Signed     CDY/MEDQ  D:  05/08/2005 29:56:21  T:  05/08/2005 30:86:57  Job:  [id number]

## 2010-07-23 NOTE — Consult Note (Signed)
NAMEHOMER, MILLER              ACCOUNT NO.:  [id number]   MEDICAL RECORD NO.:  [id number]          PATIENT TYPE:  INP   LOCATION:  2914                         FACILITY:  [facility]   PHYSICIAN:  Duke Salvia, MD, FACCDATE OF BIRTH:  [birth year]   DATE OF CONSULTATION:  08/27/2006  DATE OF DISCHARGE:                                 CONSULTATION   REASON FOR CONSULTATION:  Thank you for asking [place] to see Mr. Sabas Sous in electrophysiological consultation for broad complex  tachycardia.   HISTORY:  Mr. Seidner is a 50 year old African American male followed  by [facility] for nonischemic cardiomyopathy and broad  QRS who has had no arhythmia history.  I had seen him about 10 months  ago for consideration of ICD implantation.  At that point he had class 2  congestive failure which has been relatively stable.   He had just returned from the far [place] where he had been on vacation.  He had the abrupt onset of palpitations associated with lightheadedness  and presented to the emergency room in this tachycardia.  He was  monitored down there for some period of time.  Attempts to terminate the  tachycardia with adenosine and Cardizem failed. Dr. Gala Romney saw later  in the evening and undertook cardioversion with restoration of sinus  rhythm.  He has had protracted significant heart failure since then with  dyspnea.  His troponins are mildly elevated and to have peaked thus far  of 1.5.  He has a history of hypertension.  He has no known coronary  disease.   His ejection fraction is known to be about 10 to 15%, not withstanding  the above.   MEDICATIONS:  At baseline:  Lanoxin, lisinopril, spironolactone,  amlodipine, Coreg, simvastatin, but I do not have a full reconciliation  list.   ALLERGIES:  HE HAS NO KNOWN ALLERGIES.   SOCIAL HISTORY:  He is married.  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  [place]   REVIEW OF SYSTEMS:  Broadly negative.   PHYSICAL EXAMINATION:  VITAL SIGNS:  His blood pressure was 142/89,  pulse 92, his oxygen saturation 100.  He is on BiPAP.  LUNGS:  Had bibasilar crackles.  NECK: Neck veins were hard to discern. Carotids were brisk.  HEART:  Sounds were regular without murmurs.  ABDOMEN:  Protuberant.  EXTREMITIES: Femoral pulses were traced.  Distal pulses were intact.  There is no clubbing, cyanosis or edema.   LABORATORY DATA:  Thus far notable for a creatinine of 1.8, BNP of 595.   Electrocardiogram of his CT demonstrated a left bundle VT with an  inferior axis, right inferior axis.  Interestingly V1 is upright, V2 is  negative and V3 is upright.   Baseline electrocardiogram following cardioversion demonstrated sinus  rhythm at 127 with interval of 0.24/0.14/0.33.   IMPRESSION:  1. 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  2. Nonischemic cardiomyopathy  with ejection fraction previously noted      10 to 15%.  3. Intraventricular conduction defect with a QRS duration of 130 to      144, class 2 congestive heart failure with a recent CPX      demonstrating RER of 112, a VO2 max of 18.8 which was predicted at      68% max and a VO2 slope of 29.  4. Positive troponin.  5. Obesity.  6. Question obstructive sleep apnea.   DISCUSSION:  Mr. Antigua previously seen and recommended prophylactic  ICD implantation.  Now with sustained monomorphic V-Tach in the setting  of his cardiomyopathy.  Would most appropriately be treated with an ICD.  I will need to review his functional status with Dr.  Gala Romney to  understand whether his limitations might benefit from a left ventricular  resynchronization device.   The timing of this will currently be deferred until after  catheterization which is planned for Monday, assuming that his condition   stabilization which I expect it will do rapidly over the weekend.   Medical __________ will be deferred to Dr. [name].   Thank you for this consultation.      Duke Salvia, MD, [facility]  Electronically Signed     SCK/MEDQ  D:  10/27/2006  T:  10/28/2006  Job:  [id number]   cc:   Caryn Bee L. Little, M.D.

## 2010-07-23 NOTE — Letter (Signed)
January 11, 2006    Vesta Mixer, M.D.  [address], [address]  [place], [place] [address]   RE:  [name]  MRN:  [id number]  /  DOB:  [birth year]   Dear [name]:   It was a pleasure to see [name] today for consideration of ICD  implantation.   As you know, he is a 50 year old African-American male who has a history of  a dilated myopathy first identified in 1999 when he presented with symptoms  of congestive heart failure.  This occurred in the context of hypertension.  There were problems with progressive heart failure and he was ultimately  sent to [facility] for consideration of transplant evaluation and ICD/CRT  implantation.  We do not have a lot of the information from there, as you  know, but his recollection of it was that he had gotten so much better that  when he underwent CPX testing he was found to be way too healthy, if you  will, for transplantation.  At this point he has mild symptoms.  He is able  to climb a flight of stairs and walk a couple hundred yards.  He does not  have nocturnal dyspnea or orthopnea.  He does not have peripheral edema,  although he has had in the past.   His diet is notable for some salt intake, although he does not add.   His past related medical history is notable for obstructive sleep apnea  treated with surgery for a deviated septum.   A more recent echocardiogram from March 2007 demonstrates an ejection  fraction in the 15-20% range.   His current medications include Coumadin, Lasix, potassium, Lanoxin 0.25,  Coreg CR 80, Zocor 20,  colchicine 0.6, Norvasc 5.   His past medical history in addition to the above is notable for gout and  this is one of his major limitations currently with exercise.   He has no known drug allergies.   He is married.  He has a couple of kids.  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   On examination, he is a middle-aged African-American male  in no acute  distress.  His blood pressure is mildly elevated today at 138/92 with a  pulse of 68.  His weight is 247 pounds.  HEENT exam demonstrated no icterus,  no xanthelasma.  Neck veins were flat.  Carotids were brisk and full  bilaterally without bruits.  The back was without kyphosis or scoliosis.  The lungs were clear.  Heart sounds were regular without murmurs or gallops.  The PMI was displaced.  The abdomen was soft with active bowel sounds  without midline pulsation or hepatomegaly.  Femoral pulses were 2+, distal  pulses were intact.  There was no clubbing, cyanosis, or edema and the  neurological exam was grossly normal.  His skin was warm and dry.   Electrocardiogram dated November 7 demonstrated sinus rhythm at 68 with  intervals of 0.19/0.14/0.39.  There is an IVCD left bundle-like pattern,  although there was an RSR-prime in leads I and L.   IMPRESSION:  1. Nonischemic cardiomyopathy.  2. Class II congestive failure.  3. Broad QRS with a left bundle-branch-block-like pattern.  4. Obesity.  5. Hypertension.  6. Obstructive sleep apnea.   [name], [name] has nonischemic heart disease, a broad QRS, and heart  failure symptoms put him at moderate risk for sudden cardiac death, the  possibility of which could  be reduced with ICD implantation undertaken for  primary prevention.   In addition, with his broad QRS and his mild heart failure symptoms, he  would be a candidate for consideration of the MADIT-CRT protocol randomizing  patients like him to prophylactic left ventricular lead placement in the  hopes of trying to prevent the development of heart failure symptoms over  time.  I have reviewed both of these things with him extensively.  He is  quite open to the idea of an ICD and he would like to think further about  the possibility of MADIT-CRT involvement.   [name], thanks very much for asking [place] to see him and we will keep you abreast  of his followup with  [place].    Sincerely,      Duke Salvia, MD, [facility]  Electronically Signed    SCK/MedQ  DD: 01/13/2006  DT: 01/13/2006  Job #: [id number]   CC:    Anna Genre. Little, M.D.

## 2010-07-23 NOTE — Op Note (Signed)
NAME:  [name], [name]                        ACCOUNT NO.:  [id number]   MEDICAL RECORD NO.:  [id number]                   PATIENT TYPE:  AMB   LOCATION:  DSC                                  FACILITY:  [facility]   PHYSICIAN:  Thera Flake., M.D.             DATE OF BIRTH:  [birth year]   DATE OF PROCEDURE:  04/11/2003  DATE OF DISCHARGE:                                 OPERATIVE REPORT   INDICATIONS:  A 50 year old with persistent right knee pain, catching,  popping, not responding over months and months of conservative treatment,  thought to be amenable to outpatient surgery.   PREOPERATIVE DIAGNOSES:  1. Lateral meniscus tear, right knee.  2. Grade III chondromalacia, medial patella.  3. Synovitis, lateral gutter.   OPERATION:  1. Partial lateral meniscectomy.  2. Debridement chondroplasty, patella.  3. Limited synovectomy.   SURGEON:  Dyke Brackett, M.D.   ANESTHESIA:  MAC.   DESCRIPTION OF PROCEDURE:  Arthroscope through inferomedial and  inferolateral portal.  Systematic inspection of the knee showed the patient  to have moderately severe grade III chondromalacia of the patella, which was  debrided, trochlear groove relatively spared.  Medial compartment and medial  meniscus were normal.  ACL and PCL were normal.  Lateral meniscus showed a  small tear involving about 15% of the meniscus substance, fraying type  degenerative tearing that was debrided.  In the lateral gutter there was a  moderate amount of synovitis, which was debrided.  Again, lateral  meniscectomy carried out separate from debridement of the medial patella,  knee drained free of fluids, portals infiltrated with Marcaine with addition  of 40 mg of Depo-Medrol into the joint.  Lightly compressive sterile  dressing applied.                                               Thera Flake., M.D.    WDC/MEDQ  D:  04/11/2003  T:  04/12/2003  Job:  [id number]

## 2010-08-03 ENCOUNTER — Other Ambulatory Visit: Payer: Self-pay | Admitting: *Deleted

## 2010-08-03 MED ORDER — OLMESARTAN MEDOXOMIL 40 MG PO TABS: 40.0000 mg | ORAL_TABLET | Freq: Every day | ORAL | Status: DC

## 2010-08-03 NOTE — Telephone Encounter (Signed)
Fax received from pharmacy. Refill completed. Jodette Tanishia Lemaster RN  

## 2010-08-12 ENCOUNTER — Encounter: Payer: 59 | Admitting: *Deleted

## 2010-08-17 ENCOUNTER — Encounter: Payer: Self-pay | Admitting: *Deleted

## 2010-08-29 ENCOUNTER — Other Ambulatory Visit: Payer: Self-pay

## 2010-08-30 ENCOUNTER — Ambulatory Visit (INDEPENDENT_AMBULATORY_CARE_PROVIDER_SITE_OTHER): Payer: 59 | Admitting: *Deleted

## 2010-08-30 DIAGNOSIS — I428 Other cardiomyopathies: Secondary | ICD-10-CM

## 2010-08-30 DIAGNOSIS — I5022 Chronic systolic (congestive) heart failure: Secondary | ICD-10-CM

## 2010-08-30 DIAGNOSIS — I4729 Other ventricular tachycardia: Secondary | ICD-10-CM

## 2010-08-30 DIAGNOSIS — I472 Ventricular tachycardia: Secondary | ICD-10-CM

## 2010-09-02 ENCOUNTER — Telehealth: Payer: Self-pay | Admitting: Internal Medicine

## 2010-09-02 NOTE — Telephone Encounter (Signed)
optivol index was upper. Left message to call back. If he does would like to put him on a low-dose furosemide i.e. Like Lasix 20 mg every other day for 2 weeks and returns and if his optivol.

## 2010-09-03 ENCOUNTER — Telehealth: Payer: Self-pay | Admitting: Internal Medicine

## 2010-09-03 MED ORDER — FUROSEMIDE 20 MG PO TABS: ORAL_TABLET | ORAL | Status: DC

## 2010-09-03 NOTE — Telephone Encounter (Signed)
Pt returning Dr. Graciela Husbands call about test results.

## 2010-09-03 NOTE — Telephone Encounter (Signed)
Pt.  Is calling back. He states  Dr. Graciela Husbands left a message yesterday regarding the remote device check. I let Patient know the results and Dr. Graciela Husbands recommendations to start Lasix 20 mg  One tablet every other day for two weeks. Dr, Graciela Husbands finished the note" and returns if his optivol." ? A prescription for Lasix 20 mg send to [facility]. Per pt's request.

## 2010-09-07 NOTE — Progress Notes (Signed)
icd remote w/icm  

## 2010-09-13 ENCOUNTER — Other Ambulatory Visit: Payer: Self-pay | Admitting: Cardiovascular Disease

## 2010-09-14 ENCOUNTER — Other Ambulatory Visit: Payer: Self-pay | Admitting: *Deleted

## 2010-09-14 MED ORDER — CARVEDILOL 25 MG PO TABS: 25.0000 mg | ORAL_TABLET | Freq: Two times a day (BID) | ORAL | Status: DC

## 2010-09-14 MED ORDER — SPIRONOLACTONE 25 MG PO TABS: 25.0000 mg | ORAL_TABLET | Freq: Every day | ORAL | Status: DC

## 2010-09-14 NOTE — Telephone Encounter (Signed)
Fax received from pharmacy. Refill completed. Jodette Fatoumata Albaugh RN  

## 2010-09-14 NOTE — Telephone Encounter (Signed)
Fax received from pharmacy. Refill completed. Jodette Corbin Falck RN  

## 2010-09-30 ENCOUNTER — Encounter: Payer: 59 | Admitting: *Deleted

## 2010-10-05 ENCOUNTER — Encounter: Payer: Self-pay | Admitting: *Deleted

## 2010-10-09 ENCOUNTER — Other Ambulatory Visit: Payer: Self-pay | Admitting: Cardiovascular Disease

## 2010-10-11 ENCOUNTER — Other Ambulatory Visit: Payer: Self-pay | Admitting: *Deleted

## 2010-10-11 MED ORDER — DIGOXIN 250 MCG PO TABS: 250.0000 ug | ORAL_TABLET | Freq: Every day | ORAL | Status: DC

## 2010-10-11 NOTE — Telephone Encounter (Signed)
Fax received from pharmacy. Refill completed. Jodette Nevin Grizzle RN  

## 2010-12-02 ENCOUNTER — Encounter: Payer: 59 | Admitting: *Deleted

## 2010-12-07 ENCOUNTER — Encounter: Payer: Self-pay | Admitting: *Deleted

## 2010-12-14 ENCOUNTER — Encounter: Payer: Self-pay | Admitting: Internal Medicine

## 2010-12-14 ENCOUNTER — Other Ambulatory Visit: Payer: Self-pay | Admitting: Internal Medicine

## 2010-12-14 ENCOUNTER — Ambulatory Visit (INDEPENDENT_AMBULATORY_CARE_PROVIDER_SITE_OTHER): Payer: 59 | Admitting: *Deleted

## 2010-12-14 DIAGNOSIS — I472 Ventricular tachycardia: Secondary | ICD-10-CM

## 2010-12-14 DIAGNOSIS — I5022 Chronic systolic (congestive) heart failure: Secondary | ICD-10-CM

## 2010-12-14 DIAGNOSIS — I428 Other cardiomyopathies: Secondary | ICD-10-CM

## 2010-12-14 DIAGNOSIS — I4729 Other ventricular tachycardia: Secondary | ICD-10-CM

## 2010-12-16 LAB — BASIC METABOLIC PANEL
BUN: 14
CO2: 26
CO2: 27
Calcium: 8.9
Calcium: 9
Chloride: 103
Creatinine, Ser: 1.42
GFR calc Af Amer: 48 — ABNORMAL LOW
GFR calc Af Amer: 51 — ABNORMAL LOW
GFR calc Af Amer: >60
GFR calc non Af Amer: 39 — ABNORMAL LOW
GFR calc non Af Amer: 43 — ABNORMAL LOW
GFR calc non Af Amer: 53 — ABNORMAL LOW
Glucose, Bld: 106 — ABNORMAL HIGH
Glucose, Bld: 93
Potassium: 3.9
Potassium: 3.9
Potassium: 4
Sodium: 135
Sodium: 137
Sodium: 140

## 2010-12-16 LAB — COMPREHENSIVE METABOLIC PANEL
ALT: 25
ALT: 29
AST: 21
AST: 23
Albumin: 3.6
Albumin: 3.6
Alkaline Phosphatase: 48
Alkaline Phosphatase: 53
BUN: 10
BUN: 24 — ABNORMAL HIGH
CO2: 25
Calcium: 9.2
Chloride: 100
Chloride: 104
Creatinine, Ser: 1.29
GFR calc Af Amer: >60
GFR calc non Af Amer: 60 — ABNORMAL LOW
Glucose, Bld: 105 — ABNORMAL HIGH
Potassium: 4.1
Potassium: 4.1
Sodium: 136
Sodium: 139
Total Bilirubin: 0.8
Total Bilirubin: 0.9
Total Protein: 7.4

## 2010-12-16 LAB — DIGOXIN LEVEL: Digoxin Level: 0.6 — ABNORMAL LOW

## 2010-12-16 LAB — LIPID PANEL
Cholesterol: 123
Total CHOL/HDL Ratio: 6.2
VLDL: 28

## 2010-12-16 LAB — CBC
HCT: 39.1
Hemoglobin: 13.1
MCHC: 33.5
MCV: 87
Platelets: 193
RBC: 4.49
RDW: 14.8 — ABNORMAL HIGH
WBC: 10.3

## 2010-12-16 LAB — B-NATRIURETIC PEPTIDE (CONVERTED LAB)
Pro B Natriuretic peptide (BNP): 204 — ABNORMAL HIGH
Pro B Natriuretic peptide (BNP): 280 — ABNORMAL HIGH
Pro B Natriuretic peptide (BNP): 87

## 2010-12-16 LAB — PROTIME-INR
INR: 1
Prothrombin Time: 13.6

## 2010-12-17 LAB — CBC
HCT: 47.2
Hemoglobin: 15.9
MCHC: 33.8
MCHC: 33.9
MCHC: 33.9
MCHC: 34
MCV: 87.8
MCV: 88.1
MCV: 88.4
Platelets: 169
Platelets: 203
Platelets: 222
RBC: 4.72
RBC: 4.78
RBC: 5.42
RDW: 13.2
RDW: 13.5
WBC: 12.6 — ABNORMAL HIGH
WBC: 15 — ABNORMAL HIGH
WBC: 19.1 — ABNORMAL HIGH

## 2010-12-17 LAB — LIPID PANEL
Cholesterol: 130
Total CHOL/HDL Ratio: 5.4
VLDL: 15

## 2010-12-17 LAB — CK TOTAL AND CKMB (NOT AT ARMC)
CK, MB: 24.3 — ABNORMAL HIGH
Relative Index: 7.3 — ABNORMAL HIGH
Total CK: 331 — ABNORMAL HIGH

## 2010-12-17 LAB — BASIC METABOLIC PANEL
BUN: 16
BUN: 19
BUN: 20
BUN: 21
BUN: 23
CO2: 23
CO2: 24
CO2: 26
CO2: 28
CO2: 28
CO2: 29
Calcium: 8.6
Calcium: 8.8
Calcium: 8.8
Calcium: 8.9
Calcium: 9
Calcium: 9.1
Chloride: 100
Chloride: 100
Chloride: 103
Chloride: 105
Creatinine, Ser: 1.35
Creatinine, Ser: 1.35
Creatinine, Ser: 1.37
Creatinine, Ser: 1.39
Creatinine, Ser: 1.59 — ABNORMAL HIGH
GFR calc Af Amer: 53 — ABNORMAL LOW
GFR calc Af Amer: 54 — ABNORMAL LOW
GFR calc Af Amer: 57 — ABNORMAL LOW
GFR calc Af Amer: >60
GFR calc Af Amer: >60
GFR calc Af Amer: >60
GFR calc non Af Amer: 45 — ABNORMAL LOW
GFR calc non Af Amer: 50 — ABNORMAL LOW
GFR calc non Af Amer: 55 — ABNORMAL LOW
GFR calc non Af Amer: >60
Glucose, Bld: 105 — ABNORMAL HIGH
Glucose, Bld: 129 — ABNORMAL HIGH
Glucose, Bld: 130 — ABNORMAL HIGH
Potassium: 3.5
Potassium: 3.9
Potassium: 4
Potassium: 4.4
Sodium: 132 — ABNORMAL LOW
Sodium: 132 — ABNORMAL LOW
Sodium: 134 — ABNORMAL LOW
Sodium: 134 — ABNORMAL LOW

## 2010-12-17 LAB — COMPREHENSIVE METABOLIC PANEL
Albumin: 3.5
Alkaline Phosphatase: 56
BUN: 16
Chloride: 104
Glucose, Bld: 192 — ABNORMAL HIGH
Potassium: 4.5
Total Bilirubin: 1.2

## 2010-12-17 LAB — CARDIAC PANEL(CRET KIN+CKTOT+MB+TROPI)
Total CK: 451 — ABNORMAL HIGH
Troponin I: 5.45

## 2010-12-17 LAB — HEPARIN LEVEL (UNFRACTIONATED)
Heparin Unfractionated: 0.31
Heparin Unfractionated: 0.35
Heparin Unfractionated: 0.45

## 2010-12-17 LAB — I-STAT 8, (EC8 V) (CONVERTED LAB)
Bicarbonate: 21.6
HCT: 52
Hemoglobin: 17.7 — ABNORMAL HIGH
Operator id: 282201
Potassium: 4
Sodium: 140
TCO2: 23

## 2010-12-17 LAB — B-NATRIURETIC PEPTIDE (CONVERTED LAB): Pro B Natriuretic peptide (BNP): 245 — ABNORMAL HIGH

## 2010-12-17 LAB — POCT CARDIAC MARKERS: Troponin i, poc: 0.45 — ABNORMAL HIGH

## 2010-12-17 LAB — TSH: TSH: 0.804

## 2010-12-17 LAB — URINALYSIS, ROUTINE W REFLEX MICROSCOPIC
Bilirubin Urine: NEGATIVE
Glucose, UA: NEGATIVE
Ketones, ur: NEGATIVE
pH: 5.5

## 2010-12-17 LAB — URINE MICROSCOPIC-ADD ON

## 2010-12-17 LAB — PROTIME-INR: Prothrombin Time: 14.1

## 2010-12-17 LAB — T3, FREE: T3, Free: 3 (ref 2.3–4.2)

## 2010-12-17 LAB — HEMOGLOBIN A1C: Hgb A1c MFr Bld: 6.2 — ABNORMAL HIGH

## 2010-12-18 LAB — REMOTE ICD DEVICE
AL AMPLITUDE: 2.6 mv
ATRIAL PACING ICD: 0.12 pct
BAMS-0001: 170 {beats}/min
BRDY-0002LV: 50 {beats}/min
FVT: 0
LV LEAD IMPEDENCE ICD: 684 Ohm
RV LEAD AMPLITUDE: 20 mv
RV LEAD IMPEDENCE ICD: 532 Ohm
TOT-0001: 1
TOT-0006: 20100205000000
TZAT-0001ATACH: 3
TZAT-0001SLOWVT: 1
TZAT-0001SLOWVT: 2
TZAT-0002ATACH: NEGATIVE
TZAT-0004SLOWVT: 8
TZAT-0004SLOWVT: 8
TZAT-0005FASTVT: 88 pct
TZAT-0005SLOWVT: 88 pct
TZAT-0005SLOWVT: 91 pct
TZAT-0011FASTVT: 10 ms
TZAT-0011SLOWVT: 10 ms
TZAT-0012ATACH: 150 ms
TZAT-0012ATACH: 150 ms
TZAT-0012FASTVT: 200 ms
TZAT-0013SLOWVT: 3
TZAT-0013SLOWVT: 3
TZAT-0018ATACH: NEGATIVE
TZAT-0019ATACH: 6 V
TZAT-0019ATACH: 6 V
TZAT-0020ATACH: 1.5 ms
TZAT-0020ATACH: 1.5 ms
TZAT-0020ATACH: 1.5 ms
TZON-0003VSLOWVT: 450 ms
TZON-0004SLOWVT: 16
TZST-0001ATACH: 4
TZST-0001ATACH: 5
TZST-0001ATACH: 6
TZST-0001FASTVT: 3
TZST-0001FASTVT: 5
TZST-0001FASTVT: 6
TZST-0001SLOWVT: 3
TZST-0001SLOWVT: 4
TZST-0001SLOWVT: 5
TZST-0002ATACH: NEGATIVE
TZST-0003FASTVT: 35 J
TZST-0003FASTVT: 35 J
TZST-0003SLOWVT: 35 J
TZST-0003SLOWVT: 35 J
VF: 0

## 2010-12-20 NOTE — Progress Notes (Signed)
icd remote check  

## 2011-01-11 ENCOUNTER — Encounter: Payer: Self-pay | Admitting: *Deleted

## 2011-01-17 ENCOUNTER — Other Ambulatory Visit: Payer: Self-pay | Admitting: Cardiovascular Disease

## 2011-01-17 MED ORDER — ATORVASTATIN CALCIUM 40 MG PO TABS: 40.0000 mg | ORAL_TABLET | Freq: Every day | ORAL | Status: DC

## 2011-01-17 NOTE — Telephone Encounter (Signed)
Pt needs Lipitor 40 mg called into fax [facility]   [phone].  Pt has about 8 days left.

## 2011-02-08 ENCOUNTER — Telehealth: Payer: Self-pay | Admitting: Cardiovascular Disease

## 2011-02-08 NOTE — Telephone Encounter (Signed)
Called pt/App made.

## 2011-02-08 NOTE — Telephone Encounter (Signed)
Needs to know when his next visit is due

## 2011-03-07 ENCOUNTER — Encounter: Payer: Self-pay | Admitting: Cardiovascular Disease

## 2011-03-17 ENCOUNTER — Encounter: Payer: 59 | Admitting: *Deleted

## 2011-03-21 ENCOUNTER — Encounter: Payer: Self-pay | Admitting: *Deleted

## 2011-03-22 ENCOUNTER — Encounter: Payer: Self-pay | Admitting: Cardiovascular Disease

## 2011-03-22 ENCOUNTER — Ambulatory Visit (INDEPENDENT_AMBULATORY_CARE_PROVIDER_SITE_OTHER): Payer: 59 | Admitting: Cardiovascular Disease

## 2011-03-22 DIAGNOSIS — I428 Other cardiomyopathies: Secondary | ICD-10-CM

## 2011-03-22 DIAGNOSIS — I5022 Chronic systolic (congestive) heart failure: Secondary | ICD-10-CM

## 2011-03-22 DIAGNOSIS — I10 Essential (primary) hypertension: Secondary | ICD-10-CM

## 2011-03-22 NOTE — Patient Instructions (Signed)
Your physician wants you to follow-up in: 6 MONTHS You will receive a reminder letter in the mail two months in advance. If you don't receive a letter, please call our office to schedule the follow-up appointment.  Your physician recommends that you return for a FASTING lipid profile: 6 MONTHS    

## 2011-03-22 NOTE — Assessment & Plan Note (Signed)
[name] has a history of chronic systolic congestive heart failure. He seems to be doing fairly well. We'll continue with his same medications. I've encouraged him to continue with a good diet, exercise, and weight loss program.

## 2011-03-22 NOTE — Progress Notes (Signed)
    Tommy Rainwater Date of Birth  02-24-1961 [facility]     [place]  [address]    [address]   [address] [place], [place]  [address]    [place], [place]  [address] [phone]  Fax  [phone]  [phone]  Fax [phone]   History of Present Illness:  Hero is a 51 year old gentleman with a history of an idiopathic dilated cardiomyopathy. He has had an AICD placed. He's had ventricular tachycardia. He also has a history of hypertension, sleep apnea, gout, and cholecystectomy.  He's had lots of problems with gout recently. He's been on prednisone, colchicine, and Uloric.  He gained quite a bit of weight so he stopped the prednisone.  Current Outpatient Prescriptions on File Prior to Visit  Medication Sig Dispense Refill  . atorvastatin (LIPITOR) 40 MG tablet Take 1 tablet (40 mg total) by mouth daily.  90 tablet  1  . carvedilol (COREG) 25 MG tablet TAKE 1 TABLET TWICE A DAY  180 tablet  1  . digoxin (LANOXIN) 0.25 MG tablet TAKE 1 TABLET DAILY  90 tablet  1  . febuxostat (ULORIC) 40 MG tablet Take 80 mg by mouth daily.       . fluticasone (VERAMYST) 27.5 MCG/SPRAY nasal spray Place 1 spray into the nose as needed.       Marland Kitchen olmesartan (BENICAR) 40 MG tablet Take 1 tablet (40 mg total) by mouth daily.  90 tablet  3    No Known Allergies  Past Medical History  Diagnosis Date  . Gout   . Cardiomyopathy     Idiopathic dilated  . Ventricular tachycardia   . AICD (automatic cardioverter/defibrillator) present     within upgrade recently  . HTN (hypertension)   . Sleep apnea     Past Surgical History  Procedure Date  . Cholecystectomy   . Cardiac catheterization     he was found to have normal coronary arteries but with a globally dilated and hypocontractile heart  . US echocardiography 03-17-2008, 07-03-2006    EF 15-20%, EF 15-20%    History  Smoking status  . Never Smoker   Smokeless tobacco  . Not on file    History  Alcohol Use      No family history on file.  Reviw of Systems:  Reviewed in the HPI.  All other systems are negative.  Physical Exam: BP 144/92  Pulse 74  Ht 5\' 11"  (1.803 m)  Wt 256 lb (116.121 kg)  BMI 35.70 kg/m2 The patient is alert and oriented x 3.  The mood and affect are normal.   Skin: warm and dry.  Color is normal.    HEENT:   [place]/AT, no JVD, normal carotids  Lungs: clear   Heart: RR, no murmurs    Abdomen: mild obesity  Extremities:  Trace edema  Neuro:  nonfocal     ECG: Normal sinus rhythm. Bi-Ventricular pacing.  Assessment / Plan:

## 2011-03-28 ENCOUNTER — Encounter: Payer: Self-pay | Admitting: Internal Medicine

## 2011-03-28 ENCOUNTER — Ambulatory Visit (INDEPENDENT_AMBULATORY_CARE_PROVIDER_SITE_OTHER): Payer: 59 | Admitting: *Deleted

## 2011-03-28 DIAGNOSIS — I428 Other cardiomyopathies: Secondary | ICD-10-CM

## 2011-03-28 DIAGNOSIS — I4729 Other ventricular tachycardia: Secondary | ICD-10-CM

## 2011-03-28 DIAGNOSIS — I472 Ventricular tachycardia: Secondary | ICD-10-CM

## 2011-04-03 LAB — REMOTE ICD DEVICE
AL IMPEDENCE ICD: 475 Ohm
ATRIAL PACING ICD: 0.12 pct
BRDY-0002LV: 50 {beats}/min
BRDY-0003LV: 130 {beats}/min
BRDY-0004LV: 120 {beats}/min
CHARGE TIME: 9.679 s
FVT: 0
PACEART VT: 3
TOT-0001: 1
TOT-0002: 0
TZAT-0001ATACH: 3
TZAT-0001FASTVT: 1
TZAT-0001SLOWVT: 1
TZAT-0001SLOWVT: 2
TZAT-0002ATACH: NEGATIVE
TZAT-0002ATACH: NEGATIVE
TZAT-0002ATACH: NEGATIVE
TZAT-0012ATACH: 150 ms
TZAT-0012FASTVT: 200 ms
TZAT-0018ATACH: NEGATIVE
TZAT-0018SLOWVT: NEGATIVE
TZAT-0018SLOWVT: NEGATIVE
TZAT-0019ATACH: 6 V
TZAT-0019ATACH: 6 V
TZAT-0019ATACH: 6 V
TZAT-0019FASTVT: 8 V
TZAT-0019SLOWVT: 8 V
TZAT-0019SLOWVT: 8 V
TZAT-0020FASTVT: 1.5 ms
TZON-0004VSLOWVT: 20
TZON-0005SLOWVT: 12
TZST-0001ATACH: 5
TZST-0001ATACH: 6
TZST-0001FASTVT: 2
TZST-0001FASTVT: 4
TZST-0001FASTVT: 6
TZST-0001SLOWVT: 3
TZST-0002ATACH: NEGATIVE
TZST-0002ATACH: NEGATIVE
TZST-0003FASTVT: 35 J
TZST-0003FASTVT: 35 J
TZST-0003SLOWVT: 35 J

## 2011-04-04 NOTE — Progress Notes (Signed)
Remote icd check w/icm  

## 2011-04-07 ENCOUNTER — Ambulatory Visit (INDEPENDENT_AMBULATORY_CARE_PROVIDER_SITE_OTHER): Payer: 59 | Admitting: Internal Medicine

## 2011-04-07 ENCOUNTER — Encounter: Payer: Self-pay | Admitting: Internal Medicine

## 2011-04-07 VITALS — BP 125/76 | HR 69 | Ht 70.0 in | Wt 259.0 lb

## 2011-04-07 DIAGNOSIS — I472 Ventricular tachycardia, unspecified: Secondary | ICD-10-CM

## 2011-04-07 DIAGNOSIS — R0602 Shortness of breath: Secondary | ICD-10-CM

## 2011-04-07 DIAGNOSIS — I499 Cardiac arrhythmia, unspecified: Secondary | ICD-10-CM

## 2011-04-07 DIAGNOSIS — I4729 Other ventricular tachycardia: Secondary | ICD-10-CM

## 2011-04-07 DIAGNOSIS — R0600 Dyspnea, unspecified: Secondary | ICD-10-CM

## 2011-04-07 LAB — CBC WITH DIFFERENTIAL/PLATELET
Basophils Absolute: 0 10*3/uL (ref 0.0–0.1)
Eosinophils Relative: 1.3 % (ref 0.0–5.0)
Lymphocytes Relative: 21.7 % (ref 12.0–46.0)
Monocytes Relative: 10.5 % (ref 3.0–12.0)
Neutrophils Relative %: 66.2 % (ref 43.0–77.0)
Platelets: 183 10*3/uL (ref 150.0–400.0)
RDW: 15.8 % — ABNORMAL HIGH (ref 11.5–14.6)
WBC: 9.3 10*3/uL (ref 4.5–10.5)

## 2011-04-07 LAB — BASIC METABOLIC PANEL
BUN: 15 mg/dL (ref 6–23)
Chloride: 102 mEq/L (ref 96–112)
Potassium: 3.9 mEq/L (ref 3.5–5.1)
Sodium: 139 mEq/L (ref 135–145)

## 2011-04-07 LAB — TSH: TSH: 1.15 u[IU]/mL (ref 0.35–5.50)

## 2011-04-07 MED ORDER — FUROSEMIDE 40 MG PO TABS: 40.0000 mg | ORAL_TABLET | Freq: Every day | ORAL | Status: DC

## 2011-04-07 NOTE — Assessment & Plan Note (Signed)
As above.  Will also check his thyroid status and a CBC to make sure there is not a primary problem underlying his aggravations of heart failure

## 2011-04-07 NOTE — Patient Instructions (Signed)
Your physician recommends that you have lab work today: bmp/cbc/tsh/digoxin  Your physician has recommended you make the following change in your medication:  1) Increase lasix (furosemide) to 40 mg one tablet by mouth once daily.  Your physician recommends that you keep your follow-up appointment on: Tuesday 05/17/11 at 11:00am with Dr. Graciela Husbands.

## 2011-04-07 NOTE — Assessment & Plan Note (Signed)
As above.

## 2011-04-07 NOTE — Assessment & Plan Note (Signed)
The patient has recurrent ventricular tachycardia that is coincidental with the increase in his optivol index and with his symptoms of bloatedness and shortness of breath. I think that it is likely an electrical mechanical interaction and hopefully treating his heart failure will settle his electrical system down. To that end will increase his Lasix from 20-40 mg a day  In addition, given his history of renal insufficiency with a creatinine of 1.7 a couple of years ago, we will recheck his creatinine to make sure renal insufficiency has not contributed to this. We will also check a digoxin level.

## 2011-04-07 NOTE — Progress Notes (Signed)
  HPI  [name] is a 51 y.o. male is seen in followup for congestive heart failure in the setting of nonischemic cardiomyopathy. He is status post CRT-D. implantation. He had been previously treated for VT with amiodarone which was discontinued because of   lightheadedness  He recently has had flurries of VT requiring therapy He has not any awareness of these episodes. He has noted however over the recent weeks abdominal bloating associated with dyspnea on exertion there has been no peripheral edema.  He had been treated for some months with prednisone for gout. This is associated with "significant weight gain" although looking at his review screen today there is only a 3 pound weight gain since March.        Past Medical History  Diagnosis Date  . Gout   . Cardiomyopathy     Idiopathic dilated  . Ventricular tachycardia   . AICD (automatic cardioverter/defibrillator) present     within upgrade recently  . HTN (hypertension)   . Sleep apnea     Past Surgical History  Procedure Date  . Cholecystectomy   . Cardiac catheterization     he was found to have normal coronary arteries but with a globally dilated and hypocontractile heart  . US echocardiography 03-17-2008, 07-03-2006    EF 15-20%, EF 15-20%    Current Outpatient Prescriptions  Medication Sig Dispense Refill  . atorvastatin (LIPITOR) 40 MG tablet Take 1 tablet (40 mg total) by mouth daily.  90 tablet  1  . carvedilol (COREG) 25 MG tablet TAKE 1 TABLET TWICE A DAY  180 tablet  1  . digoxin (LANOXIN) 0.25 MG tablet TAKE 1 TABLET DAILY  90 tablet  1  . febuxostat (ULORIC) 40 MG tablet Take 80 mg by mouth daily.       . fluticasone (VERAMYST) 27.5 MCG/SPRAY nasal spray Place 1 spray into the nose as needed.       . furosemide (LASIX) 20 MG tablet Take 20 mg by mouth as needed.      Marland Kitchen olmesartan (BENICAR) 40 MG tablet Take 1 tablet (40 mg total) by mouth daily.  90 tablet  3  . spironolactone (ALDACTONE) 25 MG  tablet Take 25 mg by mouth. Taking Three Times a Week        No Known Allergies  Review of Systems negative except from HPI and PMH  Physical Exam There were no vitals taken for this visit. Well developed and well nourished in no acute distress HENT normal E scleral and icterus clear Neck Supple JVP 8-9; +HJR; carotids brisk and full Clear to ausculation Regular rate and rhythm, 2/6 murmurs gallops or rub Soft with active bowel sounds No clubbing cyanosis Trace Edema Alert and oriented, grossly normal motor and sensory function Skin Warm and Dry   Assessment and  Plan

## 2011-04-08 LAB — DIGOXIN LEVEL: Digoxin Level: 1.6 ng/mL (ref 0.8–2.0)

## 2011-04-11 ENCOUNTER — Other Ambulatory Visit: Payer: Self-pay | Admitting: *Deleted

## 2011-04-11 DIAGNOSIS — I4891 Unspecified atrial fibrillation: Secondary | ICD-10-CM

## 2011-05-17 ENCOUNTER — Ambulatory Visit (INDEPENDENT_AMBULATORY_CARE_PROVIDER_SITE_OTHER): Payer: 59 | Admitting: Internal Medicine

## 2011-05-17 ENCOUNTER — Encounter: Payer: Self-pay | Admitting: Internal Medicine

## 2011-05-17 ENCOUNTER — Encounter: Payer: 59 | Admitting: Internal Medicine

## 2011-05-17 DIAGNOSIS — I5022 Chronic systolic (congestive) heart failure: Secondary | ICD-10-CM

## 2011-05-17 DIAGNOSIS — I472 Ventricular tachycardia: Secondary | ICD-10-CM

## 2011-05-17 DIAGNOSIS — Z95810 Presence of automatic (implantable) cardiac defibrillator: Secondary | ICD-10-CM | POA: Insufficient documentation

## 2011-05-17 DIAGNOSIS — I4729 Other ventricular tachycardia: Secondary | ICD-10-CM

## 2011-05-17 DIAGNOSIS — I428 Other cardiomyopathies: Secondary | ICD-10-CM

## 2011-05-17 LAB — ICD DEVICE OBSERVATION
AL AMPLITUDE: 1.5 mv
AL IMPEDENCE ICD: 494 Ohm
AL THRESHOLD: 0.5 V
BATTERY VOLTAGE: 2.9224 V
LV LEAD IMPEDENCE ICD: 684 Ohm
LV LEAD THRESHOLD: 0.5 V
RV LEAD IMPEDENCE ICD: 532 Ohm
TOT-0002: 0
TOT-0006: 20100205000000
TZAT-0001ATACH: 2
TZAT-0001ATACH: 3
TZAT-0001FASTVT: 1
TZAT-0001SLOWVT: 1
TZAT-0001SLOWVT: 2
TZAT-0004FASTVT: 8
TZAT-0004SLOWVT: 8
TZAT-0004SLOWVT: 8
TZAT-0005FASTVT: 88 pct
TZAT-0012ATACH: 150 ms
TZAT-0012ATACH: 150 ms
TZAT-0012SLOWVT: 200 ms
TZAT-0013FASTVT: 1
TZAT-0018FASTVT: NEGATIVE
TZAT-0019ATACH: 6 V
TZAT-0019SLOWVT: 8 V
TZAT-0019SLOWVT: 8 V
TZAT-0020ATACH: 1.5 ms
TZAT-0020ATACH: 1.5 ms
TZAT-0020FASTVT: 1.5 ms
TZAT-0020SLOWVT: 1.5 ms
TZAT-0020SLOWVT: 1.5 ms
TZON-0003VSLOWVT: 450 ms
TZON-0004VSLOWVT: 20
TZST-0001ATACH: 6
TZST-0001FASTVT: 2
TZST-0001FASTVT: 4
TZST-0001FASTVT: 5
TZST-0001SLOWVT: 3
TZST-0001SLOWVT: 5
TZST-0002ATACH: NEGATIVE
TZST-0002ATACH: NEGATIVE
TZST-0003FASTVT: 35 J
TZST-0003FASTVT: 35 J
TZST-0003FASTVT: 35 J
TZST-0003SLOWVT: 20 J
TZST-0003SLOWVT: 35 J
VF: 0

## 2011-05-17 NOTE — Assessment & Plan Note (Signed)
His heart failure is much improved. His optimal index is down. Unfortunately, the slope is decreasing and I'm concerned in the presence of his edema that he needs to go back on a diuretic. He is to see the nephrologist today. We'll plan to let him make a final decision I think 2 or 3 days a week would probably be appropriate

## 2011-05-17 NOTE — Assessment & Plan Note (Addendum)
No recurrent ventricular tachycardia. This supports a hypophysis of electral mechanical interactions

## 2011-05-17 NOTE — Assessment & Plan Note (Signed)
The patient's device was interrogated.  The information was reviewed. No changes were made in the programming.    

## 2011-05-17 NOTE — Patient Instructions (Signed)
Remote monitoring is used to monitor your Pacemaker of ICD from home. This monitoring reduces the number of office visits required to check your device to one time per year. It allows [place] to keep an eye on the functioning of your device to ensure it is working properly. You are scheduled for a device check from home on 06/16/11. You may send your transmission at any time that day. If you have a wireless device, the transmission will be sent automatically. After your physician reviews your transmission, you will receive a postcard with your next transmission date.  Your physician wants you to follow-up in: 1 year with Dr. Graciela Husbands. You will receive a reminder letter in the mail two months in advance. If you don't receive a letter, please call our office to schedule the follow-up appointment.  Your physician recommends that you continue on your current medications as directed. Please refer to the Current Medication list given to you today.

## 2011-05-17 NOTE — Progress Notes (Signed)
   HPI  [name] is a 51 y.o. male is seen in followup for congestive heart failure in the setting of nonischemic cardiomyopathy. He is status post CRT-D. implantation. He had been previously treated for VT with amiodarone which was discontinued because of   lightheadedness He was seen in [place] with  flurries of VT requiring therapy which i had hoped might be attributable to mechanical-electric issues aggravaed by chf He did not have  any awareness of these episodes.   Labs then were pretty good except dig 1.6 and dose was decreased  He has done better. Abdominal bloating resolved. His edema resolved but has come back a little bit.           Past Medical History  Diagnosis Date  . Gout   . Cardiomyopathy     Idiopathic dilated  . Ventricular tachycardia     recurrent with ATP   . biv icd     medtronic   . HTN (hypertension)   . Sleep apnea     Past Surgical History  Procedure Date  . Cholecystectomy   . Cardiac catheterization     he was found to have normal coronary arteries but with a globally dilated and hypocontractile heart  . US echocardiography 03-17-2008, 07-03-2006    EF 15-20%, EF 15-20%    Current Outpatient Prescriptions  Medication Sig Dispense Refill  . atorvastatin (LIPITOR) 40 MG tablet Take 1 tablet (40 mg total) by mouth daily.  90 tablet  1  . carvedilol (COREG) 25 MG tablet TAKE 1 TABLET TWICE A DAY  180 tablet  1  . digoxin (LANOXIN) 0.25 MG tablet Take 1/2 tablet by mouth once daily.      . Febuxostat (ULORIC) 80 MG TABS Take 1 tablet by mouth daily.      . fluticasone (VERAMYST) 27.5 MCG/SPRAY nasal spray Place 1 spray into the nose as needed.       Marland Kitchen olmesartan (BENICAR) 40 MG tablet Take 1 tablet (40 mg total) by mouth daily.  90 tablet  3  . omega-3 acid ethyl esters (LOVAZA) 1 G capsule Take 2 g by mouth daily.      Marland Kitchen spironolactone (ALDACTONE) 25 MG tablet Take 25 mg by mouth. Taking Three Times a Week      . furosemide (LASIX) 40 MG  tablet Take 1 tablet (40 mg total) by mouth daily.  30 tablet  11    No Known Allergies  Review of Systems negative except from HPI and PMH  Physical Exam BP 123/80  Pulse 71  Ht 5\' 10"  (1.778 m)  Wt 255 lb (115.667 kg)  BMI 36.59 kg/m2 Well developed and well nourished in no acute distress HENT normal E scleral and icterus clear Neck Supple JVP 7-8; +HJR; carotids brisk and full Clear to ausculation Regular rate and rhythm, 2/6 murmurs gallops or rub Soft with active bowel sounds No clubbing cyanosis Trace Edema Alert and oriented, grossly normal motor and sensory function Skin Warm and Dry   Assessment and  Plan

## 2011-06-16 ENCOUNTER — Encounter: Payer: Self-pay | Admitting: Internal Medicine

## 2011-06-16 ENCOUNTER — Ambulatory Visit (INDEPENDENT_AMBULATORY_CARE_PROVIDER_SITE_OTHER): Payer: 59 | Admitting: *Deleted

## 2011-06-16 DIAGNOSIS — I5022 Chronic systolic (congestive) heart failure: Secondary | ICD-10-CM

## 2011-06-17 LAB — REMOTE ICD DEVICE
AL AMPLITUDE: 1.5 mv
AL IMPEDENCE ICD: 532 Ohm
BAMS-0001: 170 {beats}/min
BRDY-0003LV: 130 {beats}/min
CHARGE TIME: 9.999 s
LV LEAD IMPEDENCE ICD: 684 Ohm
PACEART VT: 0
RV LEAD AMPLITUDE: 20 mv
RV LEAD IMPEDENCE ICD: 532 Ohm
TOT-0001: 1
TOT-0002: 0
TZAT-0001ATACH: 2
TZAT-0001FASTVT: 1
TZAT-0001SLOWVT: 1
TZAT-0001SLOWVT: 2
TZAT-0002ATACH: NEGATIVE
TZAT-0002ATACH: NEGATIVE
TZAT-0004FASTVT: 8
TZAT-0005SLOWVT: 88 pct
TZAT-0005SLOWVT: 91 pct
TZAT-0011SLOWVT: 10 ms
TZAT-0011SLOWVT: 10 ms
TZAT-0012ATACH: 150 ms
TZAT-0012FASTVT: 200 ms
TZAT-0018ATACH: NEGATIVE
TZAT-0019ATACH: 6 V
TZAT-0019ATACH: 6 V
TZAT-0019SLOWVT: 8 V
TZAT-0019SLOWVT: 8 V
TZAT-0020ATACH: 1.5 ms
TZAT-0020ATACH: 1.5 ms
TZAT-0020FASTVT: 1.5 ms
TZON-0003VSLOWVT: 450 ms
TZON-0004VSLOWVT: 20
TZST-0001ATACH: 5
TZST-0001FASTVT: 2
TZST-0001FASTVT: 4
TZST-0001SLOWVT: 3
TZST-0001SLOWVT: 4
TZST-0001SLOWVT: 5
TZST-0002ATACH: NEGATIVE
TZST-0003FASTVT: 35 J
TZST-0003FASTVT: 35 J
TZST-0003FASTVT: 35 J
TZST-0003SLOWVT: 20 J
TZST-0003SLOWVT: 35 J

## 2011-06-23 NOTE — Progress Notes (Signed)
Remote optivol check only  

## 2011-07-05 ENCOUNTER — Encounter: Payer: Self-pay | Admitting: *Deleted

## 2011-07-07 ENCOUNTER — Other Ambulatory Visit: Payer: Self-pay | Admitting: Cardiovascular Disease

## 2011-07-08 NOTE — Telephone Encounter (Signed)
Fax Received. Refill Completed. Terrence Wishon Chowoe (R.M.A)   

## 2011-07-21 ENCOUNTER — Ambulatory Visit (INDEPENDENT_AMBULATORY_CARE_PROVIDER_SITE_OTHER): Payer: 59 | Admitting: *Deleted

## 2011-07-21 ENCOUNTER — Encounter: Payer: Self-pay | Admitting: Internal Medicine

## 2011-07-21 DIAGNOSIS — I5022 Chronic systolic (congestive) heart failure: Secondary | ICD-10-CM

## 2011-07-29 LAB — REMOTE ICD DEVICE
BATTERY VOLTAGE: 2.902 V
BRDY-0002LV: 50 {beats}/min
BRDY-0004LV: 120 {beats}/min
FVT: 0
LV LEAD IMPEDENCE ICD: 684 Ohm
LV LEAD THRESHOLD: 0.5 V
PACEART VT: 8
TOT-0006: 20100205000000
TZAT-0001ATACH: 2
TZAT-0001FASTVT: 1
TZAT-0001SLOWVT: 1
TZAT-0001SLOWVT: 2
TZAT-0002ATACH: NEGATIVE
TZAT-0002ATACH: NEGATIVE
TZAT-0005FASTVT: 88 pct
TZAT-0005SLOWVT: 88 pct
TZAT-0005SLOWVT: 91 pct
TZAT-0011FASTVT: 10 ms
TZAT-0011SLOWVT: 10 ms
TZAT-0011SLOWVT: 10 ms
TZAT-0012ATACH: 150 ms
TZAT-0013FASTVT: 1
TZAT-0018ATACH: NEGATIVE
TZAT-0018ATACH: NEGATIVE
TZAT-0018FASTVT: NEGATIVE
TZAT-0018SLOWVT: NEGATIVE
TZAT-0018SLOWVT: NEGATIVE
TZAT-0019ATACH: 6 V
TZAT-0019ATACH: 6 V
TZAT-0019SLOWVT: 8 V
TZAT-0020ATACH: 1.5 ms
TZON-0003FASTVT: 300 ms
TZON-0003SLOWVT: 410 ms
TZON-0004SLOWVT: 16
TZON-0004VSLOWVT: 20
TZON-0005SLOWVT: 12
TZST-0001ATACH: 5
TZST-0001FASTVT: 2
TZST-0001FASTVT: 3
TZST-0001FASTVT: 5
TZST-0001SLOWVT: 3
TZST-0002ATACH: NEGATIVE
TZST-0003FASTVT: 35 J
TZST-0003SLOWVT: 20 J
TZST-0003SLOWVT: 35 J
VENTRICULAR PACING ICD: 97.52 pct
VF: 0

## 2011-08-05 ENCOUNTER — Other Ambulatory Visit: Payer: Self-pay | Admitting: Cardiovascular Disease

## 2011-08-08 ENCOUNTER — Encounter: Payer: Self-pay | Admitting: *Deleted

## 2011-08-18 ENCOUNTER — Encounter: Payer: Self-pay | Admitting: Internal Medicine

## 2011-08-18 ENCOUNTER — Ambulatory Visit (INDEPENDENT_AMBULATORY_CARE_PROVIDER_SITE_OTHER): Payer: 59 | Admitting: *Deleted

## 2011-08-18 DIAGNOSIS — I5022 Chronic systolic (congestive) heart failure: Secondary | ICD-10-CM

## 2011-08-18 DIAGNOSIS — Z95810 Presence of automatic (implantable) cardiac defibrillator: Secondary | ICD-10-CM

## 2011-08-26 LAB — REMOTE ICD DEVICE
AL AMPLITUDE: 1.6 mv
ATRIAL PACING ICD: 0.14 pct
LV LEAD IMPEDENCE ICD: 646 Ohm
RV LEAD IMPEDENCE ICD: 532 Ohm
TOT-0001: 1
TOT-0006: 20100205000000
TZAT-0001ATACH: 1
TZAT-0001ATACH: 2
TZAT-0001ATACH: 3
TZAT-0001SLOWVT: 1
TZAT-0001SLOWVT: 2
TZAT-0002ATACH: NEGATIVE
TZAT-0002ATACH: NEGATIVE
TZAT-0004FASTVT: 8
TZAT-0004SLOWVT: 8
TZAT-0004SLOWVT: 8
TZAT-0005FASTVT: 88 pct
TZAT-0005SLOWVT: 88 pct
TZAT-0005SLOWVT: 91 pct
TZAT-0011FASTVT: 10 ms
TZAT-0012ATACH: 150 ms
TZAT-0012ATACH: 150 ms
TZAT-0013SLOWVT: 3
TZAT-0013SLOWVT: 3
TZAT-0018ATACH: NEGATIVE
TZAT-0018ATACH: NEGATIVE
TZAT-0019ATACH: 6 V
TZAT-0020ATACH: 1.5 ms
TZON-0003ATACH: 350 ms
TZON-0003SLOWVT: 410 ms
TZON-0003VSLOWVT: 450 ms
TZON-0004SLOWVT: 16
TZON-0004VSLOWVT: 20
TZST-0001ATACH: 4
TZST-0001ATACH: 6
TZST-0001FASTVT: 3
TZST-0001FASTVT: 5
TZST-0001FASTVT: 6
TZST-0001SLOWVT: 3
TZST-0001SLOWVT: 5
TZST-0001SLOWVT: 6
TZST-0002ATACH: NEGATIVE
TZST-0002ATACH: NEGATIVE
TZST-0003FASTVT: 35 J
TZST-0003FASTVT: 35 J
TZST-0003SLOWVT: 20 J
TZST-0003SLOWVT: 35 J
VF: 0

## 2011-08-30 ENCOUNTER — Encounter: Payer: Self-pay | Admitting: *Deleted

## 2011-09-29 ENCOUNTER — Other Ambulatory Visit: Payer: Self-pay | Admitting: Internal Medicine

## 2011-11-22 ENCOUNTER — Encounter: Payer: Self-pay | Admitting: Internal Medicine

## 2011-11-22 ENCOUNTER — Ambulatory Visit (INDEPENDENT_AMBULATORY_CARE_PROVIDER_SITE_OTHER): Payer: 59 | Admitting: Internal Medicine

## 2011-11-22 VITALS — BP 126/84 | HR 84 | Ht 70.0 in | Wt 257.0 lb

## 2011-11-22 DIAGNOSIS — I10 Essential (primary) hypertension: Secondary | ICD-10-CM

## 2011-11-22 DIAGNOSIS — I472 Ventricular tachycardia, unspecified: Secondary | ICD-10-CM

## 2011-11-22 DIAGNOSIS — I5022 Chronic systolic (congestive) heart failure: Secondary | ICD-10-CM

## 2011-11-22 DIAGNOSIS — I4729 Other ventricular tachycardia: Secondary | ICD-10-CM

## 2011-11-22 DIAGNOSIS — Z95810 Presence of automatic (implantable) cardiac defibrillator: Secondary | ICD-10-CM

## 2011-11-22 MED ORDER — METOPROLOL SUCCINATE ER 25 MG PO TB24: 12.5000 mg | ORAL_TABLET | Freq: Every day | ORAL | Status: DC

## 2011-11-22 NOTE — Assessment & Plan Note (Signed)
The patient's device was interrogated.  The information was reviewed. No changes were made in the programming.    

## 2011-11-22 NOTE — Patient Instructions (Addendum)
Remote monitoring is used to monitor your Pacemaker of ICD from home. This monitoring reduces the number of office visits required to check your device to one time per year. It allows [place] to keep an eye on the functioning of your device to ensure it is working properly. You are scheduled for a device check from home on February 27, 2012. You may send your transmission at any time that day. If you have a wireless device, the transmission will be sent automatically. After your physician reviews your transmission, you will receive a postcard with your next transmission date.  Remote monitoring is used to monitor your Pacemaker of ICD from home. This monitoring reduces the number of office visits required to check your device to one time per year. It allows [place] to keep an eye on the functioning of your device to ensure it is working properly. You are scheduled for a device check from home on December 26, 2011. You may send your transmission at any time that day. If you have a wireless device, the transmission will be sent automatically. After your physician reviews your transmission, you will receive a postcard with your next transmission date.  Your physician wants you to follow-up in: 1 year with Dr Graciela Husbands.  You will receive a reminder letter in the mail two months in advance. If you don't receive a letter, please call our office to schedule the follow-up appointment.  Start Metoprolol Succinate 12.5mg  (1/2 tablet) daily.  Your physician has requested that you have an echocardiogram. Echocardiography is a painless test that uses sound waves to create images of your heart. It provides your doctor with information about the size and shape of your heart and how well your heart's chambers and valves are working. This procedure takes approximately one hour. There are no restrictions for this procedure.

## 2011-11-22 NOTE — Assessment & Plan Note (Signed)
Relatively well controlled 

## 2011-11-22 NOTE — Progress Notes (Signed)
Patient Care Team: Catha Gosselin, MD as PCP - General (Family Medicine)   HPI  [name] is a 51 y.o. male is seen in followup for congestive heart failure in the setting of nonischemic cardiomyopathy. He is status post CRT-D. implantation. He had been previously treated for VT with amiodarone which was discontinued because of  lightheadedness He was seen in jan 2103 with flurries of VT requiring therapy which i had hoped might be attributable to mechanical-electric issues aggravaed by chf  He did not have any awareness of these episodes.  Labs then were pretty good except dig 1.6 and dose was decreased   He is coming in today because he had complaints of shoulder discomfort that occurred at the same time as having gout in his knee. This is mostly resolved.  He has had no significant problems with shortness of breath. He has had some edema.  He does not weigh himself regularly.  .   Past Medical History  Diagnosis Date  . Gout   . Cardiomyopathy     Idiopathic dilated  . Ventricular tachycardia     recurrent with ATP   . biv icd     medtronic   . HTN (hypertension)   . Sleep apnea     Past Surgical History  Procedure Date  . Cholecystectomy   . Cardiac catheterization     he was found to have normal coronary arteries but with a globally dilated and hypocontractile heart  . US echocardiography 03-17-2008, 07-03-2006    EF 15-20%, EF 15-20%    Current Outpatient Prescriptions  Medication Sig Dispense Refill  . allopurinol (ZYLOPRIM) 300 MG tablet Take 300 mg by mouth daily.      Marland Kitchen atorvastatin (LIPITOR) 40 MG tablet Take 1 tablet (40 mg total) by mouth daily.  90 tablet  1  . BENICAR 40 MG tablet TAKE 1 TABLET DAILY  90 tablet  2  . carvedilol (COREG) 25 MG tablet TAKE 1 TABLET TWICE A DAY  180 tablet  4  . digoxin (LANOXIN) 0.25 MG tablet Take 1/2 tablet by mouth once daily.      . Febuxostat (ULORIC) 80 MG TABS Take 1 tablet by mouth daily.      . fluticasone  (VERAMYST) 27.5 MCG/SPRAY nasal spray Place 1 spray into the nose as needed.       . furosemide (LASIX) 40 MG tablet Take 1 tablet (40 mg total) by mouth daily.  30 tablet  11  . omega-3 acid ethyl esters (LOVAZA) 1 G capsule Take 2 g by mouth daily.      Marland Kitchen spironolactone (ALDACTONE) 25 MG tablet Take 25 mg by mouth. Taking Three Times a Week       . valACYclovir (VALTREX) 500 MG tablet TAKE 1 TABLET EVERY DAY  30 tablet  2    No Known Allergies  Review of Systems negative except from HPI and PMH  Physical Exam BP 126/84  Pulse 84  Ht 5\' 10"  (1.778 m)  Wt 257 lb (116.574 kg)  BMI 36.88 kg/m2  SpO2 96% Well developed and well nourished in no acute distress HENT normal E scleral and icterus clear Neck Supple JVP flat; carotids brisk and full Clear to ausculation Device pocket well healed; without hematoma or erythema regular rate and rhythm, no murmurs gallops or rub Soft with active bowel sounds No clubbing cyanosis  no Edema Alert and oriented, grossly normal motor and sensory function Skin Warm and Dry    Assessment  and  Plan

## 2011-11-22 NOTE — Assessment & Plan Note (Signed)
1 there is some mild fluid overload. His visits 2008 to assess left ventricular function. We'll undertake an echocardiogram.

## 2011-11-22 NOTE — Assessment & Plan Note (Signed)
The patient has had intercurrent episode of ventricular tachycardia which was successfully terminated with antitachycardia pacing; there has been none since May. He was unaware of any of them.  They  did not correlate with his optivol index. We will increase his beta blockers by adding metoprolol to his carvedilol at very low dose. He has resting tachycardia it is my hope that by the edge of his beta blocker he be able to control the ventricular tachycardia.

## 2011-11-23 LAB — ICD DEVICE OBSERVATION
AL AMPLITUDE: 1.6 mv
ATRIAL PACING ICD: 0.1 pct
BATTERY VOLTAGE: 2.79 V
RV LEAD IMPEDENCE ICD: 532 Ohm
TZAT-0001ATACH: 3
TZAT-0001FASTVT: 1
TZAT-0001SLOWVT: 1
TZAT-0001SLOWVT: 2
TZAT-0012ATACH: 150 ms
TZAT-0012ATACH: 150 ms
TZAT-0012ATACH: 150 ms
TZAT-0012SLOWVT: 200 ms
TZAT-0012SLOWVT: 200 ms
TZAT-0013FASTVT: 1
TZAT-0013SLOWVT: 3
TZAT-0013SLOWVT: 3
TZAT-0018ATACH: NEGATIVE
TZAT-0018ATACH: NEGATIVE
TZAT-0018FASTVT: NEGATIVE
TZAT-0018SLOWVT: NEGATIVE
TZAT-0018SLOWVT: NEGATIVE
TZAT-0019FASTVT: 8 V
TZAT-0019SLOWVT: 8 V
TZAT-0019SLOWVT: 8 V
TZAT-0020ATACH: 1.5 ms
TZAT-0020ATACH: 1.5 ms
TZAT-0020SLOWVT: 1.5 ms
TZAT-0020SLOWVT: 1.5 ms
TZON-0003ATACH: 350 ms
TZON-0003FASTVT: 300 ms
TZON-0003SLOWVT: 410 ms
TZON-0003VSLOWVT: 450 ms
TZON-0004SLOWVT: 16
TZST-0001ATACH: 4
TZST-0001ATACH: 6
TZST-0001FASTVT: 2
TZST-0001FASTVT: 5
TZST-0001FASTVT: 6
TZST-0001SLOWVT: 3
TZST-0001SLOWVT: 5
TZST-0002ATACH: NEGATIVE
TZST-0002ATACH: NEGATIVE
TZST-0003FASTVT: 35 J
TZST-0003FASTVT: 35 J
TZST-0003SLOWVT: 35 J
TZST-0003SLOWVT: 35 J
VENTRICULAR PACING ICD: 98.8 pct

## 2011-11-28 ENCOUNTER — Other Ambulatory Visit (HOSPITAL_COMMUNITY): Payer: 59

## 2011-11-28 ENCOUNTER — Encounter: Payer: 59 | Admitting: *Deleted

## 2011-11-29 ENCOUNTER — Ambulatory Visit (HOSPITAL_COMMUNITY): Payer: 59 | Attending: Internal Medicine | Admitting: Radiology

## 2011-11-29 ENCOUNTER — Other Ambulatory Visit (HOSPITAL_COMMUNITY): Payer: 59

## 2011-11-29 ENCOUNTER — Other Ambulatory Visit: Payer: Self-pay

## 2011-11-29 DIAGNOSIS — I369 Nonrheumatic tricuspid valve disorder, unspecified: Secondary | ICD-10-CM | POA: Insufficient documentation

## 2011-11-29 DIAGNOSIS — I472 Ventricular tachycardia, unspecified: Secondary | ICD-10-CM

## 2011-11-29 DIAGNOSIS — I10 Essential (primary) hypertension: Secondary | ICD-10-CM | POA: Insufficient documentation

## 2011-11-29 DIAGNOSIS — I509 Heart failure, unspecified: Secondary | ICD-10-CM

## 2011-11-29 DIAGNOSIS — I5022 Chronic systolic (congestive) heart failure: Secondary | ICD-10-CM

## 2011-11-29 DIAGNOSIS — I4729 Other ventricular tachycardia: Secondary | ICD-10-CM

## 2011-11-29 DIAGNOSIS — I2589 Other forms of chronic ischemic heart disease: Secondary | ICD-10-CM | POA: Insufficient documentation

## 2011-11-29 DIAGNOSIS — I080 Rheumatic disorders of both mitral and aortic valves: Secondary | ICD-10-CM | POA: Insufficient documentation

## 2011-11-29 NOTE — Progress Notes (Signed)
Echocardiogram performed.  

## 2011-12-13 NOTE — Progress Notes (Signed)
Will forward to Dr Bensimhon 

## 2011-12-29 ENCOUNTER — Telehealth (HOSPITAL_COMMUNITY): Payer: Self-pay | Admitting: Cardiology

## 2011-12-29 NOTE — Telephone Encounter (Signed)
Message copied by JEFFRIES, Milagros Reap on Thu Dec 29, 2011 10:35 AM ------      Message from: Noralee Space      Created: Tue Dec 27, 2011  4:53 PM       Hey this guy needs a new pt referral for soon, he is referred by Dr Graciela Husbands, if you could arrange that would be great, thanks

## 2012-01-09 ENCOUNTER — Encounter (HOSPITAL_COMMUNITY): Payer: 59

## 2012-01-17 ENCOUNTER — Encounter (HOSPITAL_COMMUNITY): Payer: Self-pay

## 2012-01-17 ENCOUNTER — Ambulatory Visit (HOSPITAL_COMMUNITY)
Admission: RE | Admit: 2012-01-17 | Discharge: 2012-01-17 | Disposition: A | Discharge status: Home / Self Care | Payer: 59 | Source: Ambulatory Visit | Attending: Internal Medicine | Admitting: Internal Medicine

## 2012-01-17 VITALS — BP 130/96 | HR 82 | Wt 254.8 lb

## 2012-01-17 DIAGNOSIS — G4733 Obstructive sleep apnea (adult) (pediatric): Secondary | ICD-10-CM | POA: Insufficient documentation

## 2012-01-17 DIAGNOSIS — I5030 Unspecified diastolic (congestive) heart failure: Secondary | ICD-10-CM | POA: Insufficient documentation

## 2012-01-17 DIAGNOSIS — M109 Gout, unspecified: Secondary | ICD-10-CM | POA: Insufficient documentation

## 2012-01-17 DIAGNOSIS — I10 Essential (primary) hypertension: Secondary | ICD-10-CM | POA: Insufficient documentation

## 2012-01-17 DIAGNOSIS — I509 Heart failure, unspecified: Secondary | ICD-10-CM | POA: Insufficient documentation

## 2012-01-17 DIAGNOSIS — I428 Other cardiomyopathies: Secondary | ICD-10-CM | POA: Insufficient documentation

## 2012-01-17 DIAGNOSIS — I5022 Chronic systolic (congestive) heart failure: Secondary | ICD-10-CM

## 2012-01-17 MED ORDER — ISOSORB DINITRATE-HYDRALAZINE 20-37.5 MG PO TABS: 1.0000 | ORAL_TABLET | Freq: Three times a day (TID) | ORAL | Status: DC

## 2012-01-17 NOTE — Patient Instructions (Addendum)
  Follow up in 1 month with Dr Gala Romney  Take Bi dil 3 times a day  Please schedule CPX test   Do the following things EVERYDAY: 1) Weigh yourself in the morning before breakfast. Write it down and keep it in a log. 2) Take your medicines as prescribed 3) Eat low salt foods-Limit salt (sodium) to 2000 mg per day.  4) Stay as active as you can everyday 5) Limit all fluids for the day to less than 2 liters

## 2012-01-17 NOTE — Progress Notes (Signed)
Patient ID: [name], male   DOB: Aug 17, 1960, 51 y.o.   MRN: [id number] PCP: Dr Clarene Duke Nephrologist: Dr [name] Cardiologist: Dr Graciela Husbands Urologist:   HPI:  [name] is a 51 year old with a PMH of gout, nonischemic cardiomyopathy (EF 15%) S/P CRT-D implantation 2010, VT with amiodarone which was discontinued because of l;ightheadedness, prostatitis, and OSA (CPAP).  He was referred by Dr. Graciela Husbands for enrollment into HF Clinic  He was seen in jan 2103 with flurries of VT.  Cath 2008: Normal cors  RHC 04/2008  RA pressure is 6  RV pressure is 35/5, PCWP 7 PAP 39/7 with a mean pressure of 23 CO/CI 4.6/ 2.04 L per minute. Fick CO 4.74 L per minute  CI 2.08.   11/29/11 ECHO EF 15% Diffuse hypokinesis. Features are consistent with a pseudonormal left ventricular filling pattern, with concomitant abnormal relaxation and increased filling pressure (grade 2 diastolic dysfunction).  He is referred to clinic for HF management by Dr Graciela Husbands. Complains of increased fatigue over the last 6 months. Dyspnea when walking to back of a store. Denies orthopnea/PND/CP. Uses CPAP every night. He does not weigh on a regular basis.  Misses his medications 1 a week. Only takes lasix 2 times a week when weight goes up or belly swells. Walks about 1 mile 2 times per week. Only needs to stop once. Lives with his wife and son.     Review of Systems:     Cardiac Review of Systems: {Y] = yes [ ]  = no  Chest Pain [    ]  Resting SOB [   ] Exertional SOB  [Y  ]  Orthopnea [  ]   Pedal Edema [   ]    Palpitations [  ] Syncope  [  ]   Presyncope [   ]  General Review of Systems: [Y] = yes [  ]=no Constitional: recent weight change [  ]; anorexia [  ]; fatigue [ Y ]; nausea [  ]; night sweats [  ]; fever [  ]; or chills [  ];                                                                                                                                          Dental: poor dentition[  ]; Last Dentist visit:  Eye :  blurred vision [  ]; diplopia [   ]; vision changes [  ];  Amaurosis fugax[  ]; Resp: cough [  ];  wheezing[  ];  hemoptysis[  ]; shortness of breath[  ]; paroxysmal nocturnal dyspnea[  ]; dyspnea on exertion[  ]; or orthopnea[  ];  GI:  gallstones[  ], vomiting[  ];  dysphagia[  ]; melena[  ];  hematochezia [  ]; heartburn[  ];   Hx of  Colonoscopy[  ]; GU: kidney stones [  ];  hematuria[  ];   dysuria [  ];  nocturia[  ];  history of     obstruction [  ];                 Skin: rash, swelling[  ];, hair loss[  ];  peripheral edema[  ];  or itching[  ]; Musculosketetal: myalgias[  ];  joint swelling[  ];  joint erythema[  ];  joint pain[  ];  back pain[  ];  Heme/Lymph: bruising[  ];  bleeding[  ];  anemia[  ];  Neuro: TIA[  ];  headaches[  ];  stroke[  ];  vertigo[  ];  seizures[  ];   paresthesias[  ];  difficulty walking[  ];  Psych:depression[  ]; anxiety[  ];  Endocrine: diabetes[  ];  thyroid dysfunction[  ];  Immunizations: Flu [  ]; Pneumococcal[  ];  Other:    Past Medical History  Diagnosis Date  . Gout   . Cardiomyopathy     Idiopathic dilated  . Ventricular tachycardia     recurrent with ATP   . biv icd     medtronic   . HTN (hypertension)   . Sleep apnea     Current Outpatient Prescriptions  Medication Sig Dispense Refill  . allopurinol (ZYLOPRIM) 300 MG tablet Take 300 mg by mouth daily.      Marland Kitchen atorvastatin (LIPITOR) 40 MG tablet Take 1 tablet (40 mg total) by mouth daily.  90 tablet  1  . BENICAR 40 MG tablet TAKE 1 TABLET DAILY  90 tablet  2  . carvedilol (COREG) 25 MG tablet TAKE 1 TABLET TWICE A DAY  180 tablet  4  . digoxin (LANOXIN) 0.25 MG tablet Take 1/2 tablet by mouth once daily.      . Febuxostat (ULORIC) 80 MG TABS Take 1 tablet by mouth daily.      . fluticasone (VERAMYST) 27.5 MCG/SPRAY nasal spray Place 1 spray into the nose as needed.       . furosemide (LASIX) 40 MG tablet Take 1 tablet (40 mg total) by mouth daily.  30 tablet  11  . metoprolol  succinate (TOPROL XL) 25 MG 24 hr tablet Take 0.5 tablets (12.5 mg total) by mouth daily.  30 tablet  3  . omega-3 acid ethyl esters (LOVAZA) 1 G capsule Take 2 g by mouth daily.      Marland Kitchen spironolactone (ALDACTONE) 25 MG tablet Take 25 mg by mouth. Taking Three Times a Week       . valACYclovir (VALTREX) 500 MG tablet TAKE 1 TABLET EVERY DAY  30 tablet  2     No Known Allergies  History   Social History  . Marital Status: Married    Spouse Name: N/A    Number of Children: N/A  . Years of Education: N/A   Occupational History  . Not on file.   Social History Main Topics  . Smoking status: Never Smoker   . Smokeless tobacco: Not on file  . Alcohol Use:   . Drug Use:   . Sexually Active:    Other Topics Concern  . Not on file   Social History Narrative  . No narrative on file    No family history on file.  PHYSICAL EXAM: Filed Vitals:   01/17/12 1101  BP: 130/96   General:  Well appearing. No respiratory difficulty HEENT: normal Neck: supple. no JVD. Carotids 2+ bilat; no bruits. No lymphadenopathy or thryomegaly appreciated.  Cor: PMI nondisplaced. Regular rate & rhythm. No rubs, gallops or murmurs. Lungs: clear Abdomen: soft, nontender, nondistended. No hepatosplenomegaly. No bruits or masses. Good bowel sounds. Extremities: no cyanosis, clubbing, rash, edema Neuro: alert & oriented x 3, cranial nerves grossly intact. moves all 4 extremities w/o difficulty. Affect pleasant.    ASSESSMENT & PLAN:

## 2012-01-17 NOTE — Assessment & Plan Note (Addendum)
Reviewed medical records. Explained purpose of HF clinic. NYHA III does have fatigue when walking. Optivol reviewed. Fluid index - no threshold crossings noted and daily activity about 2 hours per day. Continue current diuretic regimen. Add BIDIL 20/37.5 mg tid. Schedule CPX. Provided weight charts and explained the purpose of daily weights. Follow up in 1 month.   Patient seen and examined with Tonye Becket, NP. We discussed all aspects of the encounter. I agree with the assessment and plan as stated above.  He has a long h/o of NICM with severe LV dysfunction. Seems to be doing ok from a functional standpoint, though. Volume status looks good on exam and by Optivol. Will add Bidil. Proceed with CPX testing. Briefly discussed possibility of advanced therapies down the road. Reinforced need for daily weights and reviewed use of sliding scale diuretics.

## 2012-01-17 NOTE — Addendum Note (Signed)
Encounter addended by: Dolores Patty, MD on: 01/17/2012  1:59 PM<BR>     Documentation filed: Charges VN

## 2012-01-25 ENCOUNTER — Emergency Department (HOSPITAL_COMMUNITY): Payer: 59

## 2012-01-25 ENCOUNTER — Encounter (HOSPITAL_COMMUNITY): Payer: Self-pay | Admitting: *Deleted

## 2012-01-25 ENCOUNTER — Inpatient Hospital Stay (HOSPITAL_COMMUNITY): Payer: 59

## 2012-01-25 ENCOUNTER — Inpatient Hospital Stay (HOSPITAL_COMMUNITY)
Admission: EM | Admit: 2012-01-25 | Discharge: 2012-01-29 | DRG: 286 | Disposition: A | Discharge status: Home / Self Care | Payer: 59 | Attending: Internal Medicine | Admitting: Internal Medicine

## 2012-01-25 ENCOUNTER — Telehealth: Payer: Self-pay | Admitting: Internal Medicine

## 2012-01-25 DIAGNOSIS — I4729 Other ventricular tachycardia: Secondary | ICD-10-CM

## 2012-01-25 DIAGNOSIS — R7401 Elevation of levels of liver transaminase levels: Secondary | ICD-10-CM

## 2012-01-25 DIAGNOSIS — Z79899 Other long term (current) drug therapy: Secondary | ICD-10-CM

## 2012-01-25 DIAGNOSIS — E669 Obesity, unspecified: Secondary | ICD-10-CM | POA: Diagnosis present

## 2012-01-25 DIAGNOSIS — M109 Gout, unspecified: Secondary | ICD-10-CM | POA: Diagnosis present

## 2012-01-25 DIAGNOSIS — Z95810 Presence of automatic (implantable) cardiac defibrillator: Secondary | ICD-10-CM

## 2012-01-25 DIAGNOSIS — I129 Hypertensive chronic kidney disease with stage 1 through stage 4 chronic kidney disease, or unspecified chronic kidney disease: Secondary | ICD-10-CM | POA: Diagnosis present

## 2012-01-25 DIAGNOSIS — N183 Chronic kidney disease, stage 3 unspecified: Secondary | ICD-10-CM | POA: Diagnosis present

## 2012-01-25 DIAGNOSIS — I428 Other cardiomyopathies: Secondary | ICD-10-CM

## 2012-01-25 DIAGNOSIS — T82897A Other specified complication of cardiac prosthetic devices, implants and grafts, initial encounter: Principal | ICD-10-CM | POA: Diagnosis present

## 2012-01-25 DIAGNOSIS — N059 Unspecified nephritic syndrome with unspecified morphologic changes: Secondary | ICD-10-CM | POA: Diagnosis present

## 2012-01-25 DIAGNOSIS — E872 Acidosis, unspecified: Secondary | ICD-10-CM | POA: Diagnosis present

## 2012-01-25 DIAGNOSIS — E861 Hypovolemia: Secondary | ICD-10-CM | POA: Diagnosis present

## 2012-01-25 DIAGNOSIS — I472 Ventricular tachycardia: Secondary | ICD-10-CM

## 2012-01-25 DIAGNOSIS — E875 Hyperkalemia: Secondary | ICD-10-CM

## 2012-01-25 DIAGNOSIS — I5023 Acute on chronic systolic (congestive) heart failure: Secondary | ICD-10-CM | POA: Diagnosis present

## 2012-01-25 DIAGNOSIS — R42 Dizziness and giddiness: Secondary | ICD-10-CM

## 2012-01-25 DIAGNOSIS — K7200 Acute and subacute hepatic failure without coma: Secondary | ICD-10-CM | POA: Diagnosis present

## 2012-01-25 DIAGNOSIS — Y838 Other surgical procedures as the cause of abnormal reaction of the patient, or of later complication, without mention of misadventure at the time of the procedure: Secondary | ICD-10-CM | POA: Diagnosis present

## 2012-01-25 DIAGNOSIS — I5022 Chronic systolic (congestive) heart failure: Secondary | ICD-10-CM

## 2012-01-25 DIAGNOSIS — T82190A Other mechanical complication of cardiac electrode, initial encounter: Secondary | ICD-10-CM

## 2012-01-25 DIAGNOSIS — N179 Acute kidney failure, unspecified: Secondary | ICD-10-CM

## 2012-01-25 DIAGNOSIS — I959 Hypotension, unspecified: Secondary | ICD-10-CM | POA: Diagnosis present

## 2012-01-25 DIAGNOSIS — G4733 Obstructive sleep apnea (adult) (pediatric): Secondary | ICD-10-CM

## 2012-01-25 DIAGNOSIS — E0590 Thyrotoxicosis, unspecified without thyrotoxic crisis or storm: Secondary | ICD-10-CM | POA: Diagnosis present

## 2012-01-25 HISTORY — DX: Unspecified systolic (congestive) heart failure: I50.20

## 2012-01-25 HISTORY — DX: Chronic kidney disease, unspecified: N18.9

## 2012-01-25 LAB — BASIC METABOLIC PANEL
BUN: 70 mg/dL — ABNORMAL HIGH (ref 6–23)
CO2: 18 mEq/L — ABNORMAL LOW (ref 19–32)
Chloride: 108 mEq/L (ref 96–112)
Chloride: 107 mEq/L (ref 96–112)
GFR calc Af Amer: 33 mL/min — ABNORMAL LOW (ref >90)
Glucose, Bld: 98 mg/dL (ref 70–99)
Potassium: >7.5 mEq/L (ref 3.5–5.1)
Potassium: >7.5 mEq/L (ref 3.5–5.1)
Sodium: 132 mEq/L — ABNORMAL LOW (ref 135–145)

## 2012-01-25 LAB — URINALYSIS, ROUTINE W REFLEX MICROSCOPIC
Bilirubin Urine: NEGATIVE
Ketones, ur: NEGATIVE mg/dL
Nitrite: NEGATIVE
Urobilinogen, UA: 0.2 mg/dL (ref 0.0–1.0)

## 2012-01-25 LAB — COMPREHENSIVE METABOLIC PANEL
AST: 51 U/L — ABNORMAL HIGH (ref 0–37)
Albumin: 3.8 g/dL (ref 3.5–5.2)
BUN: 73 mg/dL — ABNORMAL HIGH (ref 6–23)
Creatinine, Ser: 2.38 mg/dL — ABNORMAL HIGH (ref 0.50–1.35)
Total Protein: 7.8 g/dL (ref 6.0–8.3)

## 2012-01-25 LAB — CBC WITH DIFFERENTIAL/PLATELET
Basophils Absolute: 0 10*3/uL (ref 0.0–0.1)
Eosinophils Absolute: 0.1 10*3/uL (ref 0.0–0.7)
Eosinophils Relative: 1 % (ref 0–5)
HCT: 48.5 % (ref 39.0–52.0)
Lymphocytes Relative: 16 % (ref 12–46)
MCH: 31 pg (ref 26.0–34.0)
MCHC: 34.2 g/dL (ref 30.0–36.0)
MCV: 90.7 fL (ref 78.0–100.0)
Monocytes Absolute: 0.5 10*3/uL (ref 0.1–1.0)
Platelets: 180 10*3/uL (ref 150–400)
RDW: 15.4 % (ref 11.5–15.5)

## 2012-01-25 LAB — PRO B NATRIURETIC PEPTIDE: Pro B Natriuretic peptide (BNP): 332.9 pg/mL — ABNORMAL HIGH (ref 0–125)

## 2012-01-25 LAB — APTT: aPTT: 37 seconds (ref 24–37)

## 2012-01-25 LAB — DIGOXIN LEVEL: Digoxin Level: 1 ng/mL (ref 0.8–2.0)

## 2012-01-25 LAB — CBC
HCT: 48.1 % (ref 39.0–52.0)
Platelets: 182 10*3/uL (ref 150–400)
RDW: 15.4 % (ref 11.5–15.5)
WBC: 8.9 10*3/uL (ref 4.0–10.5)

## 2012-01-25 LAB — PHOSPHORUS: Phosphorus: 3.9 mg/dL (ref 2.3–4.6)

## 2012-01-25 LAB — POCT I-STAT TROPONIN I

## 2012-01-25 LAB — URINE MICROSCOPIC-ADD ON

## 2012-01-25 LAB — MRSA PCR SCREENING: MRSA by PCR: NEGATIVE

## 2012-01-25 LAB — LACTIC ACID, PLASMA: Lactic Acid, Venous: 1.2 mmol/L (ref 0.5–2.2)

## 2012-01-25 MED ORDER — SODIUM POLYSTYRENE SULFONATE 15 GM/60ML PO SUSP
60.0000 g | Freq: Once | ORAL | Status: AC
  Administered 2012-01-25: 60 g via ORAL
  Filled 2012-01-25 (×2): qty 240

## 2012-01-25 MED ORDER — ALBUTEROL (5 MG/ML) CONTINUOUS INHALATION SOLN
10.0000 mg/h | INHALATION_SOLUTION | RESPIRATORY_TRACT | Status: DC
  Administered 2012-01-25: 10 mg/h via RESPIRATORY_TRACT
  Filled 2012-01-25: qty 20

## 2012-01-25 MED ORDER — HEPARIN SODIUM (PORCINE) 5000 UNIT/ML IJ SOLN
5000.0000 [IU] | Freq: Three times a day (TID) | INTRAMUSCULAR | Status: DC
  Administered 2012-01-25 – 2012-01-28 (×9): 5000 [IU] via SUBCUTANEOUS
  Filled 2012-01-25 (×11): qty 1

## 2012-01-25 MED ORDER — SODIUM CHLORIDE 0.9 % IV SOLN
1.0000 g | Freq: Once | INTRAVENOUS | Status: AC
  Administered 2012-01-25: 1 g via INTRAVENOUS
  Filled 2012-01-25: qty 10

## 2012-01-25 MED ORDER — ALBUTEROL SULFATE (5 MG/ML) 0.5% IN NEBU: 2.5000 mg | INHALATION_SOLUTION | Freq: Once | RESPIRATORY_TRACT | Status: DC

## 2012-01-25 MED ORDER — SODIUM CHLORIDE 0.9 % IV SOLN
INTRAVENOUS | Status: DC
  Administered 2012-01-25: 20:00:00 via INTRAVENOUS

## 2012-01-25 MED ORDER — ACETAMINOPHEN 325 MG PO TABS: 650.0000 mg | ORAL_TABLET | Freq: Four times a day (QID) | ORAL | Status: DC | PRN

## 2012-01-25 MED ORDER — SODIUM CHLORIDE 0.9 % IV BOLUS (SEPSIS)
250.0000 mL | Freq: Once | INTRAVENOUS | Status: AC
  Administered 2012-01-25: 250 mL via INTRAVENOUS

## 2012-01-25 MED ORDER — INSULIN ASPART 100 UNIT/ML IV SOLN
10.0000 [IU] | Freq: Once | INTRAVENOUS | Status: AC
  Administered 2012-01-25: 10 [IU] via INTRAVENOUS
  Filled 2012-01-25: qty 0.1

## 2012-01-25 MED ORDER — DEXTROSE 50 % IV SOLN
1.0000 | Freq: Once | INTRAVENOUS | Status: AC
  Administered 2012-01-25: 50 mL via INTRAVENOUS
  Filled 2012-01-25: qty 50

## 2012-01-25 MED ORDER — DEXTROSE 50 % IV SOLN
INTRAVENOUS | Status: AC
  Filled 2012-01-25: qty 50

## 2012-01-25 MED ORDER — CALCIUM GLUCONATE 10 % IV SOLN
INTRAVENOUS | Status: AC
  Filled 2012-01-25: qty 10

## 2012-01-25 MED ORDER — SODIUM CHLORIDE 0.9 % IV SOLN
INTRAVENOUS | Status: AC
  Filled 2012-01-25: qty 100

## 2012-01-25 MED ORDER — FUROSEMIDE 10 MG/ML IJ SOLN
40.0000 mg | Freq: Two times a day (BID) | INTRAMUSCULAR | Status: DC
  Administered 2012-01-25 – 2012-01-26 (×3): 40 mg via INTRAVENOUS
  Filled 2012-01-25 (×6): qty 4

## 2012-01-25 MED ORDER — PANTOPRAZOLE SODIUM 40 MG IV SOLR
40.0000 mg | INTRAVENOUS | Status: DC
  Administered 2012-01-26: 40 mg via INTRAVENOUS
  Filled 2012-01-25 (×2): qty 40

## 2012-01-25 MED ORDER — SODIUM BICARBONATE 8.4 % IV SOLN
INTRAVENOUS | Status: AC
  Administered 2012-01-25: 22:00:00 via INTRAVENOUS
  Filled 2012-01-25 (×2): qty 150

## 2012-01-25 MED ORDER — INSULIN ASPART 100 UNIT/ML IV SOLN
10.0000 [IU] | Freq: Once | INTRAVENOUS | Status: AC
  Administered 2012-01-25: 10 [IU] via INTRAVENOUS

## 2012-01-25 MED ORDER — SODIUM BICARBONATE 8.4 % IV SOLN
50.0000 meq | Freq: Once | INTRAVENOUS | Status: AC
  Administered 2012-01-25: 50 meq via INTRAVENOUS
  Filled 2012-01-25: qty 50

## 2012-01-25 NOTE — ED Notes (Signed)
Spoke with medtronic, states that his first shock was due to rate being >200, 5 shocks after that were due to oversensing T-waves.

## 2012-01-25 NOTE — Progress Notes (Signed)
Pt noted to have a downward trend in BP. Notified MD, 250 mL bolus ordered and administered per order. Pt is asymptomatic at this time. VSS. Will continue to monitor

## 2012-01-25 NOTE — Progress Notes (Deleted)
Pt noted to have a downward trend in BP. Notified MD, 250 mL bolus ordered and administered per order. Pt is assymptomatic at this time. VSS. Will continue to monitor.

## 2012-01-25 NOTE — ED Notes (Signed)
Critical lab reported to Dr. Radford Pax

## 2012-01-25 NOTE — H&P (Signed)
PULMONARY  / CRITICAL CARE MEDICINE  Name: [name] MRN: [id number] DOB: [birth year]    LOS: 0  REFERRING MD :  EDP - Dr. Radford Pax  CHIEF COMPLAINT:  Hyperkalemia, Renal Failure  BRIEF PATIENT DESCRIPTION: 51 y/o AAM with PMH of HTN, Idiopathic dilated cardiomyopathy with BiV ICD who presented to [facility] ED on 11/20 with firing of pacemaker.  Admitted with hyperkalemia, acute on chronic renal failure & firing of AICD.   LINES / TUBES:   CULTURES:   ANTIBIOTICS:   SIGNIFICANT EVENTS:  11/20 - Admit with AICD firing, acute renal failure, hyperkalemia  LEVEL OF CARE:  ICU PRIMARY SERVICE:  PCCM CONSULTANTS:  Nephrology, Cardiology CODE STATUS:  Full Code DIET:  Clear liquid DVT Px:  Heparin GI Px:  None indicated  HISTORY OF PRESENT ILLNESS:  51 y/o AAM with PMH of HTN, OSA on CPAP, Chronic renal insufficiency (followed by Dr. Abel Presto), idiopathic dilated cardiomyopathy with BiV ICD (followed by Dr. Graciela Husbands) who presented to [facility] ED on 11/20 with firing of pacemaker while at work.  Fired approximately five times without other associated symptoms.  Patient denies chest pain, shortness of breath, syncope, dizziness, n/v.  Work up in ER demonstrated hyperkalemia (>7.5), acute on chronic renal failure & firing of AICD. PCCM called for ICU admit.    PAST MEDICAL HISTORY :  Past Medical History  Diagnosis Date  . Gout   . Cardiomyopathy     Idiopathic dilated  . Ventricular tachycardia     recurrent with ATP   . biv icd     medtronic   . HTN (hypertension)   . Sleep apnea    Past Surgical History  Procedure Date  . Cholecystectomy   . Cardiac catheterization     he was found to have normal coronary arteries but with a globally dilated and hypocontractile heart  . US echocardiography 03-17-2008, 07-03-2006    EF 15-20%, EF 15-20%   Prior to Admission medications   Medication Sig Start Date End Date Taking? Authorizing Provider  allopurinol (ZYLOPRIM) 300 MG tablet Take 300  mg by mouth daily.   Yes Historical Provider, MD  atorvastatin (LIPITOR) 40 MG tablet Take 1 tablet (40 mg total) by mouth daily. 01/17/11  Yes Vesta Mixer, MD  carvedilol (COREG) 25 MG tablet Take 25 mg by mouth 2 (two) times daily with a meal.   Yes Historical Provider, MD  digoxin (LANOXIN) 0.25 MG tablet Take 1/2 tablet by mouth once daily. 04/11/11  Yes Duke Salvia, MD  furosemide (LASIX) 40 MG tablet Take 1 tablet (40 mg total) by mouth daily. 04/07/11 04/06/12 Yes Duke Salvia, MD  isosorbide-hydrALAZINE (BIDIL) 20-37.5 MG per tablet Take 1 tablet by mouth 3 (three) times daily. 01/17/12  Yes Bevelyn Buckles Bensimhon, MD  olmesartan (BENICAR) 40 MG tablet Take 40 mg by mouth daily.   Yes Historical Provider, MD  spironolactone (ALDACTONE) 25 MG tablet Take 25 mg by mouth. Taking Three Times a Week 09/14/10  Yes Vesta Mixer, MD   No Known Allergies  FAMILY HISTORY:  History reviewed. No pertinent family history. SOCIAL HISTORY:  reports that he has never smoked. He does not have any smokeless tobacco history on file. His alcohol and drug histories not on file.  REVIEW OF SYSTEMS:  See HPI.  All other systems reviewed and are negative.    INTERVAL HISTORY: Sitting on stretcher, no acute distress.   VITAL SIGNS: Temp:  [98.1 F (36.7 C)] 98.1 F (  36.7 C) (11/20 1522) Pulse Rate:  [81-82] 81  (11/20 1730) Resp:  [16-20] 16  (11/20 1730) BP: (98-123)/(56-78) 123/64 mmHg (11/20 1730) SpO2:  [98 %-100 %] 100 % (11/20 1736)  HEMODYNAMICS:    VENTILATOR SETTINGS:   INTAKE / OUTPUT: Intake/Output    None     PHYSICAL EXAMINATION: General:  wdwn adult male in NAD Neuro:  AAOx4, speech clear, MAE HEENT:  Mm pink/moist, no jvd Cardiovascular:  s1s2 regular, wide complex rhythm noted on monitor Lungs:  resp's even/non-labored, lungs bilaterally clear Abdomen:  Obese/soft, bsx4 active Musculoskeletal:  No acute deformities Skin:  Warm/dry, no edema  LABS: cbc  Lab  01/25/12 1534  WBC 8.9  HGB 16.0  HCT 48.1   chemistry  Lab 01/25/12 1534  NA 130*  K >7.5*  CO2 14*  GLUCOSE 95  BUN 73*  CREATININE 2.47*  CALCIUM 9.7  MG --  PHOS --   Liver fxn No results found for this basename: AST:3,ALT:3,ALKPHOS:3,BILITOT:3,PROT:3,ALBUMIN:3 in the last 168 hours coags No results found for this basename: APTT:3,INR:3 in the last 168 hours Sepsis markers No results found for this basename: LATICACIDVEN:3,PROCALCITON:3 in the last 168 hours Cardiac markers No results found for this basename: CKTOTAL:3,CKMB:3,TROPONINI:3 in the last 168 hours BNP No results found for this basename: PROBNP:3 in the last 168 hours ABG No results found for this basename: PHART:3,PCO2ART:3,PO2ART:3,HCO3:3,TCO2:3 in the last 168 hours  CBG trend No results found for this basename: GLUCAP:5 in the last 168 hours  IMAGING:   11/20 CXR>>>  ECG: 11/20>>>  DIAGNOSES: Active Problems:  * No active hospital problems. *    ASSESSMENT / PLAN:  PULMONARY  ASSESSMENT: OSA - on home CPAP, compliant 3-4 times per week  PLAN:   -continue home cpap  CARDIOVASCULAR  ASSESSMENT:  HTN Dilated Idiopathic Cardiomyopathy- s/p BiV ICD placed for V-tach.  Followed by Dr. Graciela Husbands  PLAN:  -Cardiology Consult -ICU admit, tele monitoring  RENAL  ASSESSMENT:   Acute on Chronic Renal Failure- followed by Dr. Abel Presto.  Baseline CR around 1.2  PLAN:   -Nephrology Consult -bicarb, insulin, calcium, D50 given in ER -consider HD cath placement, will defer to Nephrology -f/u labs at 2200 -consider bicarb gtt  GASTROINTESTINAL  ASSESSMENT:   Obesity  PLAN:   -Renal diet  HEMATOLOGIC  ASSESSMENT:   No acute issues.   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  Pending decision regarding HD.  Follow up labs pending.   I have personally obtained a history, examined the patient, evaluated laboratory and imaging results, formulated the assessment and plan and placed orders. CRITICAL CARE: The patient is critically ill with multiple organ systems failure and requires high complexity decision making for assessment and support, frequent evaluation and titration of therapies, application of advanced monitoring technologies and extensive interpretation of multiple databases. Critical Care Time devoted to patient care services described in this note is _____minutes.    Canary Brim, NP-C Pulmonary and Critical Care Medicine [facility] Pager: [phone]  01/25/2012, 5:50 PM

## 2012-01-25 NOTE — Progress Notes (Signed)
MD notified of K+ critical level. MD also notified of post NS bolus BP 89/61. Pt VSS. Will continue to monitor.

## 2012-01-25 NOTE — ED Notes (Signed)
Paged [facility] per Dr. Radford Pax

## 2012-01-25 NOTE — Consult Note (Signed)
[facility] Renal Consultation Note  Requesting MD: Dr. Benjamine Sprague with critical care Indication for Consultation:  Hyperkalemia and acute on chronic renal failure  HPI:  [name] is a 51 y.o. male with past medical history significant for hypertension, obstructive sleep apnea on CPAP therapy, idiopathic dilated cardiomyopathy followed by Dr. Graciela Husbands with an ICD in place. Patient has stage III CKD followed by Dr. 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 Patient saw Dr. [name] in  late September of this year. At that time labs showed a creatinine level of 1.41, potassium level of 5.0.  Patient states that recent events have consisted of him having some dental work done about a week ago. In addition, patient thought to have abdominal bloating had his furosemide increased from 3 times a week to daily. Patient was also started on Bidil by his cardiologist. Patient has felt well through all of this. He did note that despite increased Lasix dosing his urine output did not increase as remarkably as it did before. He was at work today when he had sequential firing of his ICD. In emergency department he was found to have a potassium of greater than 7.5 and a creatinine level of 2.47.   other labs of note include a bicarbonate of 14.  Patient also had at least one systolic blood pressure in the 90s. He was treated with bicarbonate,  insulin and albuterol as well as calcium in the emergency department. He is alert and without complaint at present. Of note, patient was on Benicar and Aldactone as an outpatient.  Creatinine, Ser  Date/Time Value Range Status  01/25/2012  3:34 PM 2.47* 0.50 - 1.35 mg/dL Final  4/54/0981 19:14 PM 1.5  0.4 - 1.5 mg/dL Final  7/82/9562  1:30 PM 1.2  0.4-1.5 mg/dL Final  10/11/5782  6:96 AM 1.72* 0.4 - 1.5 mg/dL Final  04/15/5282  1:32 AM 1.68* 0.4 - 1.5 mg/dL Final  4/40/1027  2:53 AM 1.61* 0.4 - 1.5 mg/dL Final  08/10/4401  4:74 PM 1.50  0.4 - 1.5 mg/dL  Final  04/11/9561  8:75 AM 1.65* 0.4 - 1.5 mg/dL Final  08/09/3327  5:18 AM 1.51* 0.4 - 1.5 mg/dL Final  10/09/1658  6:30 AM 1.35  0.4 - 1.5 mg/dL Final  1/60/1093 23:55 AM 1.6* 0.4-1.5 mg/dL Final  7/32/2025 42:70 AM 1.6* 0.4-1.5 mg/dL Final  08/06/3760  8:31 PM 1.7* 0.4 - 1.5 mg/dL Final  51/09/6158  7:37 AM 1.85*  Final  12/11/2006  4:45 AM 2.01*  Final  12/10/2006  4:50 AM 1.73*  Final  12/09/2006  5:15 AM 1.42   Final  12/08/2006  4:30 PM 1.29   Final  11/04/2006  4:07 AM 1.68*  Final  11/03/2006  3:50 AM 1.66*  Final  11/02/2006  5:30 AM 1.51*  Final  11/01/2006  3:45 AM 1.24   Final  10/31/2006  4:00 AM 1.35   Final  10/30/2006  4:20 AM 1.35   Final  10/29/2006 11:15 AM 1.39   Final  10/28/2006  7:30 PM 1.37   Final  10/28/2006  3:00 AM 1.59*  Final  10/27/2006  8:14 PM 1.50   Final  10/27/2006  6:13 AM 1.81*  Final  10/27/2006  1:12 AM 1.4   Final     PMHx:   Past Medical History  Diagnosis Date  . Gout   . Cardiomyopathy     Idiopathic dilated  . Ventricular tachycardia     recurrent with ATP   .  biv icd     medtronic   . HTN (hypertension)   . Sleep apnea     Past Surgical History  Procedure Date  . Cholecystectomy   . Cardiac catheterization     he was found to have normal coronary arteries but with a globally dilated and hypocontractile heart  . US echocardiography 03-17-2008, 07-03-2006    EF 15-20%, EF 15-20%    Family Hx: History reviewed. No pertinent family history.  Social History:  reports that he has never smoked. He does not have any smokeless tobacco history on file. His alcohol and drug histories not on file.  Allergies: No Known Allergies  Medications: Prior to Admission medications   Medication Sig Start Date End Date Taking? Authorizing Provider  allopurinol (ZYLOPRIM) 300 MG tablet Take 300 mg by mouth daily.   Yes Historical Provider, MD  atorvastatin (LIPITOR) 40 MG tablet Take 1 tablet (40 mg total) by mouth daily. 01/17/11  Yes Vesta Mixer, MD    carvedilol (COREG) 25 MG tablet Take 25 mg by mouth 2 (two) times daily with a meal.   Yes Historical Provider, MD  digoxin (LANOXIN) 0.25 MG tablet Take 1/2 tablet by mouth once daily. 04/11/11  Yes Duke Salvia, MD  furosemide (LASIX) 40 MG tablet Take 1 tablet (40 mg total) by mouth daily. 04/07/11 04/06/12 Yes Duke Salvia, MD  isosorbide-hydrALAZINE (BIDIL) 20-37.5 MG per tablet Take 1 tablet by mouth 3 (three) times daily. 01/17/12  Yes Bevelyn Buckles Bensimhon, MD  olmesartan (BENICAR) 40 MG tablet Take 40 mg by mouth daily.   Yes Historical Provider, MD  spironolactone (ALDACTONE) 25 MG tablet Take 25 mg by mouth. Taking Three Times a Week 09/14/10  Yes Vesta Mixer, MD    I have reviewed the patient's current medications.  Labs:  Results for orders placed during the hospital encounter of 01/25/12 (from the past 48 hour(s))  CBC     Status: Normal   Collection Time   01/25/12  3:34 PM      Component Value Range Comment   WBC 8.9  4.0 - 10.5 K/uL    RBC 5.29  4.22 - 5.81 MIL/uL    Hemoglobin 16.0  13.0 - 17.0 g/dL    HCT 27.2  53.6 - 64.4 %    MCV 90.9  78.0 - 100.0 fL    MCH 30.2  26.0 - 34.0 pg    MCHC 33.3  30.0 - 36.0 g/dL    RDW 03.4  74.2 - 59.5 %    Platelets 182  150 - 400 K/uL   BASIC METABOLIC PANEL     Status: Abnormal   Collection Time   01/25/12  3:34 PM      Component Value Range Comment   Sodium 130 (*) 135 - 145 mEq/L    Potassium >7.5 (*) 3.5 - 5.1 mEq/L    Chloride 108  96 - 112 mEq/L    CO2 14 (*) 19 - 32 mEq/L    Glucose, Bld 95  70 - 99 mg/dL    BUN 73 (*) 6 - 23 mg/dL    Creatinine, Ser 6.38 (*) 0.50 - 1.35 mg/dL    Calcium 9.7  8.4 - 75.6 mg/dL    GFR calc non Af Amer 28 (*) >90 mL/min    GFR calc Af Amer 33 (*) >90 mL/min   DIGOXIN LEVEL     Status: Normal   Collection Time   01/25/12  3:34 PM  Component Value Range Comment   Digoxin Level 1.0  0.8 - 2.0 ng/mL   POCT I-STAT TROPONIN I     Status: Abnormal   Collection Time   01/25/12  4:02  PM      Component Value Range Comment   Troponin i, poc 0.32 (*) 0.00 - 0.08 ng/mL    Comment NOTIFIED PHYSICIAN      Comment 3               ROS: Constitutional: Patient denies excessive fatigue. He denies fevers, chills, night sweats. He had dental work about a week ago and still has a known mouth. He denies any hearing or visual complaints. Pulmonary: Patient denies any shortness breath, cough or hemoptysis. Cardiovascular: Patient denies any lower extremity edema, orthopnea or PND. His abdominal bloating improved with increased dose of Lasix. GI: Denies nausea, vomiting, diarrhea, constipation. Denies hematemesis, melena, hematochezia or bright blood per rectum. GU: Patient noted a somewhat decreased urine output but still present. It was not responding to Lasix as well as it had in the past. He denies hematuria dysuria or excessive foaminess to the urine. Neuro: Denies headaches, paresthesias or focal motor deficits. Skin: Denies any rashes musculoskeletal: Denies any joint complaints    Physical Exam: Filed Vitals:   01/25/12 1812  BP: 139/81  Pulse:   Temp:   Resp: 18     General:  Alert black male in no acute distress. He is currently receiving his albuterol therapy. HEENT: Patient is a slightly reddened right eye. His mouth was not examined. Neck: There is no jugular venous distention, carotid bruits or lymphadenopathy. Heart: Tachycardic but irregular. Lungs: Mostly clear to auscultation without wheezes bilaterally. Abdomen: Slightly obese, soft, nontender, nondistended. No hepatosplenomegaly and no palpable bruits are appreciated. Extremities: Minimal to no edema Skin: dry  Neuro: Patient is alert and oriented. The remainder of the neurologic exam is nonfocal.  Assessment/Plan: 51 year old black male with history of stage III CKD. He now presents with acute on chronic renal failure and also severe hyperkalemia in the setting of Aldactone an ARB therapy.  1.Renal- patient  with acute on chronic renal failure. Creatinine was noted to be 1.41 on September 17 of this year and is now 2.47. Given the recent upward titration of Lasix as well as the addition of Bidil what I suspect is that patient possibly has ATN based on decreased kidney perfusion due to increased medical therapy.  I do not have a urinalysis available for review right now. The patient does not describe any urinary passage difficulties so I am less suspicious of a bladder outlet issue. However, I will place a urinary catheter, will hydrate gently with bicarbonate based fluids and avoid extreme hypotension by holding other medications at this time. He will get serial evaluations of kidney function and potassium through the night. No absolute need for dialysis at this time, will attempt medical management first but did tell patient that dialysis may be a possibility if this does not work.  2. Hyperkalemia- very severe in nature. Patient is fortunate that he had an ICD in place because his hyperkalemia could have led to fatal cardiac arrhythmia. I suspect is due to both medications of Benicar and Aldactone as well as the ATN as described above. Of note, patient's potassium on September 18 was 5.0. Offending medications will be held. We will institute medical therapy. I will place him on a short term bicarbonate drip and give him Kayexalate. Serial potassiums will be measured. I  will continue his Lasix as well. 3. Hypertension/volume  -  I think patient is euvolemic to dry. I'm going to hydrate him gently mainly to assist with the hyperkalemia. We will hold blood pressure lowering medications for now until we see more renal recovery. Using lasix mainly as a forced diuretic agent to clear potassium as opposed to a fluid diuretic 4. Anemia  -  this is not an issue at this time.  Thank very much for this consultation. We will continue to follow with you. I will monitor his potassium and urine output  overnight.   Carolyna Yerian A 01/25/2012, 6:24 PM

## 2012-01-25 NOTE — ED Notes (Signed)
Pt stated that he had just remembered that he has been taking Lasix 40mg s daily for past week or so and not as he originally stated as 3 x/week as needed

## 2012-01-25 NOTE — Progress Notes (Signed)
eLink Physician-Brief Progress Note Patient Name: [name] DOB: 1960-10-05 MRN: [id number]  Date of Service  01/25/2012   HPI/Events of Note  New hypotension sbp 73, map 47. HR 78. No fever.   Baseline ef 15% and baseline sbp 120   eICU Interventions  250cc saline bolus and assess   Intervention Category Major Interventions: Hypotension - evaluation and management  Mariangela Heldt 01/25/2012, 9:36 PM

## 2012-01-25 NOTE — ED Notes (Signed)
Pt was sitting at work, denies CP, symptoms.  States that his defibrillator fired 5 times.  Pt was sweaty at the time of firing but denies diaphoresis before or after.  Pt continues to deny CP.

## 2012-01-25 NOTE — Progress Notes (Signed)
RT note: Spoke with Pt about CPAP he says he does wear at home sometimes but does not want one of our machines because they make to much noise. I told him if he changes his mind to let us know and we will get him a unit.

## 2012-01-25 NOTE — Progress Notes (Signed)
CRITICAL VALUE ALERT  Critical value received:  Potassium level  Date of notification:  01/25/2012   Time of notification:  2252  Critical value read back:yes  Nurse who received alert:  Sherry Ruffing, RN  MD notified (1st page):  Marchelle Gearing, MD  Time of first page:  2252  MD notified (2nd page):  Time of second page:  Responding MD:    Time MD responded:  2253

## 2012-01-25 NOTE — Consult Note (Signed)
Cardiology Consult Note   Patient ID: [name] MRN: [id number], DOB/AGE: 06/24/1960   Admit date: 01/25/2012 Date of Consult: 01/25/2012  Primary Physician: Mickie Hillier, MD Primary Cardiologist: Berton Mount, MD (EP)  Reason for consult: multiple ICD shocks, hyperkalemia  HPI: [name] is a 51yo AA male with PMHx s/f idiopathic dilated cardiomyopathy (NYHA II-III; EF 15% s/p Medtronic CRT-D 2010), VT (on amio, but d/ced d/t lightheadedness), HTN, CKD (stage III), OSA (on CPAP) who presented to [facility] ED with multiple ICD shocks.   Cath 2008: Normal cors  RHC 04/2008  RA pressure is 6  RV pressure is 35/5,  PCWP 7  PAP 39/7 with a mean pressure of 23  CO/CI 4.6/ 2.04 L per minute. Fick CO 4.74 L per minute CI 2.08.  11/29/11 ECHO EF 15% Diffuse hypokinesis. Features are consistent with a pseudonormal left ventricular filling pattern, with concomitant abnormal relaxation and increased filling pressure (grade 2 diastolic dysfunction).  He recently established in the heart failure clinic. He was deemed stable functionally, and appeared euvolemic. Outpatient schedule and PRN diuretic regimen was continued (Lasix 40mg  daily, additional 1-2 tabs for swelling). Bidil was added given lack of ACEi/ARB d/t CKD.   Patient's Lasix was increased from 3x/week to daily d/t concern of abdominal bloating, but did not notice increase UOP.   In the ED, BMET revealed Cr 2.38 (baseline 1.41), K > 7.5, Na 128. pCXR reveals a R paratracheal mass which was noted in 04/2008, but now appears larger. PA/lateral CXR recommended.   ICD interrogation observation notes 6 shocks for VT/VF.   Problem List: Past Medical History  Diagnosis Date  . Gout   . Cardiomyopathy     Idiopathic dilated  . Ventricular tachycardia     recurrent with ATP   . biv icd     medtronic   . HTN (hypertension)   . Sleep apnea     Past Surgical History  Procedure Date  . Cholecystectomy   . Cardiac  catheterization     he was found to have normal coronary arteries but with a globally dilated and hypocontractile heart  . US echocardiography 03-17-2008, 07-03-2006    EF 15-20%, EF 15-20%     Allergies: No Known Allergies  Home Medications: Prior to Admission medications   Medication Sig Start Date End Date Taking? Authorizing Provider  allopurinol (ZYLOPRIM) 300 MG tablet Take 300 mg by mouth daily.   Yes Historical Provider, MD  atorvastatin (LIPITOR) 40 MG tablet Take 1 tablet (40 mg total) by mouth daily. 01/17/11  Yes Vesta Mixer, MD  carvedilol (COREG) 25 MG tablet Take 25 mg by mouth 2 (two) times daily with a meal.   Yes Historical Provider, MD  digoxin (LANOXIN) 0.25 MG tablet Take 1/2 tablet by mouth once daily. 04/11/11  Yes Duke Salvia, MD  furosemide (LASIX) 40 MG tablet Take 1 tablet (40 mg total) by mouth daily. 04/07/11 04/06/12 Yes Duke Salvia, MD  isosorbide-hydrALAZINE (BIDIL) 20-37.5 MG per tablet Take 1 tablet by mouth 3 (three) times daily. 01/17/12  Yes Bevelyn Buckles Bensimhon, MD  olmesartan (BENICAR) 40 MG tablet Take 40 mg by mouth daily.   Yes Historical Provider, MD  spironolactone (ALDACTONE) 25 MG tablet Take 25 mg by mouth. Taking Three Times a Week 09/14/10  Yes Vesta Mixer, MD    Inpatient Medications:     . [COMPLETED] calcium gluconate 1 GM IV  1 g Intravenous Once  . calcium gluconate  1 GM IV  1 g Intravenous Once  . [COMPLETED] calcium gluconate      . [COMPLETED] dextrose  1 ampule Intravenous Once  . dextrose  1 ampule Intravenous Once  . dextrose      . furosemide  40 mg Intravenous BID  . heparin subcutaneous  5,000 Units Subcutaneous Q8H  . [COMPLETED] insulin aspart  10 Units Intravenous Once  . insulin aspart  10 Units Intravenous Once  . pantoprazole (PROTONIX) IV  40 mg Intravenous Q24H  . sodium bicarbonate  50 mEq Intravenous Once  . [COMPLETED] sodium chloride      . sodium polystyrene  60 g Oral Once  . [DISCONTINUED]  albuterol  2.5 mg Nebulization Once   Prescriptions prior to admission  Medication Sig Dispense Refill  . allopurinol (ZYLOPRIM) 300 MG tablet Take 300 mg by mouth daily.      Marland Kitchen atorvastatin (LIPITOR) 40 MG tablet Take 1 tablet (40 mg total) by mouth daily.  90 tablet  1  . carvedilol (COREG) 25 MG tablet Take 25 mg by mouth 2 (two) times daily with a meal.      . digoxin (LANOXIN) 0.25 MG tablet Take 1/2 tablet by mouth once daily.      . furosemide (LASIX) 40 MG tablet Take 1 tablet (40 mg total) by mouth daily.  30 tablet  11  . isosorbide-hydrALAZINE (BIDIL) 20-37.5 MG per tablet Take 1 tablet by mouth 3 (three) times daily.  90 tablet  3  . olmesartan (BENICAR) 40 MG tablet Take 40 mg by mouth daily.      Marland Kitchen spironolactone (ALDACTONE) 25 MG tablet Take 25 mg by mouth. Taking Three Times a Week        History reviewed. No pertinent family history.   History   Social History  . Marital Status: Married    Spouse Name: N/A    Number of Children: N/A  . Years of Education: N/A   Occupational History  . Not on file.   Social History Main Topics  . Smoking status: Never Smoker   . Smokeless tobacco: Not on file  . Alcohol Use:   . Drug Use:   . Sexually Active:    Other Topics Concern  . Not on file   Social History Narrative  . No narrative on file     Review of Systems: General: positive for ICD shock, negative for chills, fever, night sweats or weight changes.  Cardiovascular: negative for chest pain, dyspnea on exertion, edema, orthopnea, palpitations, paroxysmal nocturnal dyspnea or shortness of breath Dermatological: negative for rash Respiratory: negative for cough or wheezing Urologic: negative for hematuria Abdominal: negative for nausea, vomiting, diarrhea, bright red blood per rectum, melena, or hematemesis Neurologic: negative for visual changes, syncope, or dizziness All other systems reviewed and are otherwise negative except as noted above.  Physical  Exam: Blood pressure 117/63, pulse 81, temperature 98.1 F (36.7 C), temperature source Oral, resp. rate 18, SpO2 99.00%.    General: Well developed, well nourished, in no acute distress. Head: Normocephalic, atraumatic, sclera non-icteric, no xanthomas, nares are without discharge.  Neck: Negative for carotid bruits. JVD not elevated. Lungs: Clear bilaterally to auscultation without wheezes, rales, or rhonchi. Breathing is unlabored. Heart:  RRR with S1 S2. No murmurs, rubs, or gallops appreciated. Abdomen: Soft, non-tender, non-distended with normoactive bowel sounds. No hepatomegaly. No rebound/guarding. No obvious abdominal masses. Msk:  Strength and tone appears normal for age. Extremities: No clubbing, cyanosis or edema.  Distal  pedal pulses are 2+ and equal bilaterally. Neuro: Alert and oriented X 3. Moves all extremities spontaneously. Psych:  Responds to questions appropriately with a normal affect.  Labs: Recent Labs  [facility] 01/25/12 1808 01/25/12 1534   WBC 10.1 8.9   HGB 16.6 16.0   HCT 48.5 48.1   MCV 90.7 90.9   PLT 180 182   Lab 01/25/12 1753 01/25/12 1534  NA 128* 130*  K >7.5* >7.5*  CL 104 108  CO2 18* 14*  BUN 73* 73*  CREATININE 2.38* 2.47*  CALCIUM 9.7 9.7  PROT 7.8 --  BILITOT 0.5 --  ALKPHOS 65 --  ALT 34 --  AST 51* --  AMYLASE -- --  LIPASE -- --  GLUCOSE 273* 95   Radiology/Studies: Dg Chest Port 1 View  01/25/2012  *RADIOLOGY REPORT*  Clinical Data: Defibrillator fired multiple times today.  PORTABLE CHEST - 1 VIEW  Comparison: 07/03/2009 and 04/12/2008.  CT chest 04/03/2008.  Findings: Trachea is midline.  Heart is enlarged.  Pacemaker and AICD lead tips appear stable in position.  Right paratracheal mass appears larger than on 04/12/2008. Possible minimal right basilar atelectasis.  No pleural fluid.  IMPRESSION:  1.  Right paratracheal mass appears larger than on 04/12/2008, which may be due to AP semi upright technique.  PA and lateral views  of the chest could be performed in further initial evaluation, as clinically indicated. 2.  Minimal right basilar atelectasis.   Original Report Authenticated By: Leanna Battles, M.D.    EKG: A-sensed, V-paced, 88 bpm  ASSESSMENT:   1. Acute on CKD, stage III 2. Hyperkalemia 3. NICM 4. History of intermittent VT 5. HTN 6. OSA 7. Paratracheal mass   DISCUSSION/PLAN:  The patient presents today with multiple ICD shocks. Blood work in the ED reveals elevated Cr from baseline indicating acute on CKD. This is likely secondary to Lasix increase and addition of Bidil. Nephrology following. Will have serial evaluations of renal function and potassium throughout the night. No absolute need for HD. Medical management will be attempted first. Hyperkalemia likely the result of #1. Placed on bicarbonate drip and will be given Kayexalate. From a cardiomyopathic standpoint, he is euvolemic on exam. Will obviously hold on diuretics, and need to strike a balance of Lasix and Bidil prior to discharge. Again, renal function will be followed. Management from nephrology are certainly appreciated, and recommendations will be followed. From an arrhythmic standpoint, he has a history of VT, and with NICM (EF 15%) he is predisposed to ventricular dysrhythmias. ICD shocks likely the result of this given hyperkalemia. Will have EP team see him tomorrow AM. Would be helpful to have CHF input going forward once renal function stabilizes. Note, will need a PA/lateral CXR to reassess paratracheal mass noted on portable CXR.   Signed, R. Hurman Horn, PA-C 01/25/2012, 7:54 PM  Patient seen on step down unit.  At the present time he is resting comfortably.  No further ICD shocks.  Agree with assessment and plan as noted above.  His hyperkalemia appears to have been the result of over diuresis with resultant rise in creatinine and decreased urine output.  The patient has felt unusually tired and weak for the past week or so.   Examination reveals clear lung fields.  The heart reveals no gallop.  Extremities show no phlebitis or edema.  We'll ask EP to see him on rounds tomorrow.

## 2012-01-25 NOTE — ED Provider Notes (Signed)
History     CSN: [id number]  Arrival date & time 01/25/12  1517   First MD Initiated Contact with Patient 01/25/12 1518      Chief Complaint  Patient presents with  . AICD Problem    (Consider location/radiation/quality/duration/timing/severity/associated sxs/prior treatment) HPI Pt was sitting at desk and had ICD fire x5 about 30 seconds apart. No prodromal symptoms. Currently asymptomatic. Onset was sudden about one hour ago.  The pain is now gone. No pain prior to ICD firing. Had sharp, severe pain when ICD fired. Modifying factors: pain worse when ICD fired.  Associated symptoms: diaphoresis.  Recent medical care: brought here by EMS. Last saw cards about a week ago and started on Bidil.    Past Medical History  Diagnosis Date  . Gout   . Cardiomyopathy     Idiopathic dilated  . Ventricular tachycardia     recurrent with ATP   . biv icd     medtronic   . HTN (hypertension)   . Sleep apnea     Past Surgical History  Procedure Date  . Cholecystectomy   . Cardiac catheterization     he was found to have normal coronary arteries but with a globally dilated and hypocontractile heart  . US echocardiography 03-17-2008, 07-03-2006    EF 15-20%, EF 15-20%    History reviewed. No pertinent family history.  History  Substance Use Topics  . Smoking status: Never Smoker   . Smokeless tobacco: Not on file  . Alcohol Use:       Review of Systems Constitutional: Negative for fever.  Eyes: Negative for vision loss.  ENT: Negative for difficulty swallowing.  Cardiovascular: Negative for chest pain. Respiratory: Negative for respiratory distress.  Gastrointestinal:  Negative for vomiting.  Genitourinary: Negative for inability to void.  Musculoskeletal: Negative for gait problem.  Integumentary: Negative for rash.  Neurological: Negative for new focal weakness.     Allergies  Review of patient's allergies indicates no known allergies.  Home Medications   Current  Outpatient Rx  Name  Route  Sig  Dispense  Refill  . ALLOPURINOL 300 MG PO TABS   Oral   Take 300 mg by mouth daily.         . ATORVASTATIN CALCIUM 40 MG PO TABS   Oral   Take 1 tablet (40 mg total) by mouth daily.   90 tablet   1   . DIGOXIN 0.25 MG PO TABS      Take 1/2 tablet by mouth once daily.         Marland Kitchen FLUTICASONE FUROATE 27.5 MCG/SPRAY NA SUSP   Nasal   Place 1 spray into the nose as needed.          . FUROSEMIDE 40 MG PO TABS   Oral   Take 1 tablet (40 mg total) by mouth daily.   30 tablet   11   . ISOSORB DINITRATE-HYDRALAZINE 20-37.5 MG PO TABS   Oral   Take 1 tablet by mouth 3 (three) times daily.   90 tablet   3   . METOPROLOL SUCCINATE ER 25 MG PO TB24   Oral   Take 0.5 tablets (12.5 mg total) by mouth daily.   30 tablet   3   . OMEGA-3-ACID ETHYL ESTERS 1 G PO CAPS   Oral   Take 2 g by mouth daily.         Marland Kitchen SPIRONOLACTONE 25 MG PO TABS   Oral   Take  25 mg by mouth. Taking Three Times a Week          . SULFAMETHOXAZOLE-TRIMETHOPRIM 800-160 MG PO TABS   Oral   Take 1 tablet by mouth 2 (two) times daily.         Marland Kitchen VALACYCLOVIR HCL 500 MG PO TABS      TAKE 1 TABLET EVERY DAY   30 tablet   2     BP 105/68  Temp 98.1 F (36.7 C) (Oral)  Resp 20  SpO2 98%  Physical Exam Nursing note and vitals reviewed.  Constitutional: Pt is alert and appears stated age. Eyes: No injection, no scleral icterus. HENT: Atraumatic, airway open without erythema or exudate.  Respiratory: No respiratory distress. Equal breathing bilaterally. Cardiovascular: Normal rate. Extremities warm and well perfused.  Abdomen: Soft, non-tender. MSK: Extremities are atraumatic without deformity. Skin: No rash, no wounds.   Neuro: No motor nor sensory deficit.     ED Course  Procedures (including critical care time)  Labs Reviewed  BASIC METABOLIC PANEL - Abnormal; Notable for the following:    Sodium 130 (*)     Potassium >7.5 (*)     CO2 14 (*)      BUN 73 (*)     Creatinine, Ser 2.47 (*)     GFR calc non Af Amer 28 (*)     GFR calc Af Amer 33 (*)     All other components within normal limits  POCT I-STAT TROPONIN I - Abnormal; Notable for the following:    Troponin i, poc 0.32 (*)     All other components within normal limits  CBC  DIGOXIN LEVEL  GLUCOSE, RANDOM  URINALYSIS, ROUTINE W REFLEX MICROSCOPIC  BASIC METABOLIC PANEL   Dg Chest Port 1 View  01/25/2012  *RADIOLOGY REPORT*  Clinical Data: Defibrillator fired multiple times today.  PORTABLE CHEST - 1 VIEW  Comparison: 07/03/2009 and 04/12/2008.  CT chest 04/03/2008.  Findings: Trachea is midline.  Heart is enlarged.  Pacemaker and AICD lead tips appear stable in position.  Right paratracheal mass appears larger than on 04/12/2008. Possible minimal right basilar atelectasis.  No pleural fluid.  IMPRESSION:  1.  Right paratracheal mass appears larger than on 04/12/2008, which may be due to AP semi upright technique.  PA and lateral views of the chest could be performed in further initial evaluation, as clinically indicated. 2.  Minimal right basilar atelectasis.   Original Report Authenticated By: Leanna Battles, M.D.      1. Hyperkalemia   2. Acute renal failure   3. CARDIOMYOPATHY       MDM  51 y.o. male w/ pertinent PMHx of nonischemic cardiomyopathy, HTN presents w/ after ICD fired x5. Now asymptomatic, NAD. Vitals ok. EKG with paced rhythm, no STEMI, ?peaked T waves. Plan for monitoring, labs, and nurse to call for interrogation.   Chart review shows EF 15-20%.   Labs returned with critical value of K >7.5 along with AKI. Troponin elevated. CBC unremarkable. Dig wnl. On re-eval, pt resting comfortably with NAD. Ordered calcium, cont albuterol neb, insulin/dextrose. Nephrology and critical care called by Dr. Radford Pax who will come see pt here in ED. CXR with no consolidation. Repeat BMP ordered for after treatments.    Date: 01/25/2012  Rate: 88  Rhythm: paced   QRS Axis: indeterminate  Intervals: normal  ST/T Wave abnormalities: nonspecific ST changes T waves appear peaked  Conduction Disutrbances:widened QRS  Narrative Interpretation:   Old EKG Reviewed: changes noted EKG  from 04/07/2011 with sinus rhythm. QRS then widened as well     I independently viewed, interpreted, and used in my medical decision making all ordered lab and imaging tests. Medical Decision Making discussed with ED attending Nelia Shi, MD          Charm Barges, MD 01/25/12 Windy Fast

## 2012-01-25 NOTE — Telephone Encounter (Signed)
Pt called 01-25-12 at 254pm , and was shocked 5 times just now, job called ems and they showed up while on the phone with me, they are taking him to [place]

## 2012-01-25 NOTE — Progress Notes (Signed)
eLink Physician-Brief Progress Note Patient Name: OWENS HARA DOB: 01/12/1961 MRN: [id number]  Date of Service  01/25/2012   HPI/Events of Note    Lab 01/25/12 2202 01/25/12 1808 01/25/12 1753 01/25/12 1534  NA 132* -- 128* 130*  K >7.5* -- >7.5* --  CL 107 -- 104 108  CO2 18* -- 18* 14*  GLUCOSE 98 -- 273* 95  BUN 70* -- 73* 73*  CREATININE 2.23* -- 2.38* 2.47*  CALCIUM 10.2 -- 9.7 9.7  MG -- 1.5 -- --  PHOS -- 3.9 -- --     eICU Interventions  Repeat KCL > 7.5 but reflects uncorrected K. Kayexalate delayed in coming and RN only gave it now. Will check bmet again at 2am   Intervention Category Intermediate Interventions: Electrolyte abnormality - evaluation and management  Meet [place] 01/25/2012, 10:55 PM

## 2012-01-25 NOTE — Telephone Encounter (Signed)
Dr. Klein notified. 

## 2012-01-25 NOTE — ED Notes (Signed)
O2 @ 2L via [place] placed on pt

## 2012-01-25 NOTE — ED Provider Notes (Signed)
I saw and evaluated the patient, reviewed the resident's note and I agree with the findings and plan.   .Face to face Exam:  General:  Awake HEENT:  Atraumatic Resp:  Normal effort Abd:  Nondistended Neuro:No focal weakness Lymph: No adenopathy   CRITICAL CARE Performed by: Nelva Nay L   Total critical care time: 30 min  Critical care time was exclusive of separately billable procedures and treating other patients.  Critical care was necessary to treat or prevent imminent or life-threatening deterioration.  Critical care was time spent personally by me on the following activities: development of treatment plan with patient and/or surrogate as well as nursing, discussions with consultants, evaluation of patient's response to treatment, examination of patient, obtaining history from patient or surrogate, ordering and performing treatments and interventions, ordering and review of laboratory studies, ordering and review of radiographic studies, pulse oximetry and re-evaluation of patient's condition.   Nelia Shi, MD 01/25/12 1800

## 2012-01-25 NOTE — ED Notes (Signed)
[place], MD notified of abnormal lab test results

## 2012-01-26 ENCOUNTER — Inpatient Hospital Stay (HOSPITAL_COMMUNITY): Payer: 59

## 2012-01-26 DIAGNOSIS — I428 Other cardiomyopathies: Secondary | ICD-10-CM

## 2012-01-26 DIAGNOSIS — T82198A Other mechanical complication of other cardiac electronic device, initial encounter: Secondary | ICD-10-CM

## 2012-01-26 DIAGNOSIS — T82190A Other mechanical complication of cardiac electrode, initial encounter: Secondary | ICD-10-CM

## 2012-01-26 LAB — HEPATITIS B SURFACE ANTIGEN: Hepatitis B Surface Ag: NEGATIVE

## 2012-01-26 LAB — RENAL FUNCTION PANEL
CO2: 18 mEq/L — ABNORMAL LOW (ref 19–32)
Chloride: 110 mEq/L (ref 96–112)
Creatinine, Ser: 2.48 mg/dL — ABNORMAL HIGH (ref 0.50–1.35)
GFR calc Af Amer: 33 mL/min — ABNORMAL LOW (ref >90)
GFR calc non Af Amer: 28 mL/min — ABNORMAL LOW (ref >90)
Glucose, Bld: 118 mg/dL — ABNORMAL HIGH (ref 70–99)

## 2012-01-26 LAB — T4, FREE: Free T4: 1.03 ng/dL (ref 0.80–1.80)

## 2012-01-26 LAB — ALT: ALT: 55 U/L — ABNORMAL HIGH (ref 0–53)

## 2012-01-26 LAB — BASIC METABOLIC PANEL
BUN: 71 mg/dL — ABNORMAL HIGH (ref 6–23)
CO2: 17 mEq/L — ABNORMAL LOW (ref 19–32)
Chloride: 109 mEq/L (ref 96–112)
Chloride: 98 mEq/L (ref 96–112)
Creatinine, Ser: 2.54 mg/dL — ABNORMAL HIGH (ref 0.50–1.35)
GFR calc Af Amer: 32 mL/min — ABNORMAL LOW (ref >90)
GFR calc Af Amer: 47 mL/min — ABNORMAL LOW (ref >90)
GFR calc non Af Amer: 41 mL/min — ABNORMAL LOW (ref >90)
Glucose, Bld: 118 mg/dL — ABNORMAL HIGH (ref 70–99)
Potassium: >7.5 mEq/L (ref 3.5–5.1)
Potassium: 3.9 mEq/L (ref 3.5–5.1)
Sodium: 135 mEq/L (ref 135–145)

## 2012-01-26 LAB — URINE DRUGS OF ABUSE SCREEN W ALC, ROUTINE (REF LAB)
Barbiturate Quant, Ur: NEGATIVE
Cocaine Metabolites: NEGATIVE
Creatinine,U: 161 mg/dL
Methadone: NEGATIVE
Phencyclidine (PCP): NEGATIVE
Propoxyphene: NEGATIVE

## 2012-01-26 LAB — CBC
HCT: 47.5 % (ref 39.0–52.0)
Hemoglobin: 16.9 g/dL (ref 13.0–17.0)
MCHC: 35.6 g/dL (ref 30.0–36.0)
MCV: 88 fL (ref 78.0–100.0)

## 2012-01-26 LAB — TSH: TSH: 2.26 u[IU]/mL (ref 0.350–4.500)

## 2012-01-26 LAB — PHOSPHORUS: Phosphorus: 4.6 mg/dL (ref 2.3–4.6)

## 2012-01-26 MED ORDER — LIDOCAINE-PRILOCAINE 2.5-2.5 % EX CREA
1.0000 "application " | TOPICAL_CREAM | CUTANEOUS | Status: DC | PRN
  Filled 2012-01-26: qty 5

## 2012-01-26 MED ORDER — SODIUM CHLORIDE 0.9 % IV SOLN: 100.0000 mL | INTRAVENOUS | Status: DC | PRN

## 2012-01-26 MED ORDER — LABETALOL HCL 5 MG/ML IV SOLN
INTRAVENOUS | Status: AC
  Administered 2012-01-26: 10 mg via INTRAVENOUS
  Filled 2012-01-26: qty 4

## 2012-01-26 MED ORDER — SODIUM POLYSTYRENE SULFONATE 15 GM/60ML PO SUSP
60.0000 g | Freq: Once | ORAL | Status: AC
  Administered 2012-01-26: 60 g via ORAL
  Filled 2012-01-26: qty 240

## 2012-01-26 MED ORDER — PENTAFLUOROPROP-TETRAFLUOROETH EX AERO: 1.0000 "application " | INHALATION_SPRAY | CUTANEOUS | Status: DC | PRN

## 2012-01-26 MED ORDER — HEPARIN SODIUM (PORCINE) 1000 UNIT/ML DIALYSIS
1000.0000 [IU] | INTRAMUSCULAR | Status: DC | PRN
  Filled 2012-01-26: qty 1

## 2012-01-26 MED ORDER — SODIUM CHLORIDE 0.9 % IV BOLUS (SEPSIS)
250.0000 mL | Freq: Once | INTRAVENOUS | Status: AC
  Administered 2012-01-26: 250 mL via INTRAVENOUS

## 2012-01-26 MED ORDER — ALTEPLASE 2 MG IJ SOLR
2.0000 mg | Freq: Once | INTRAMUSCULAR | Status: AC | PRN
  Filled 2012-01-26: qty 2

## 2012-01-26 MED ORDER — SODIUM BICARBONATE 8.4 % IV SOLN
INTRAVENOUS | Status: DC
  Administered 2012-01-26 – 2012-01-27 (×2): via INTRAVENOUS
  Filled 2012-01-26 (×5): qty 150

## 2012-01-26 MED ORDER — LABETALOL HCL 5 MG/ML IV SOLN
10.0000 mg | INTRAVENOUS | Status: DC | PRN
  Administered 2012-01-26: 10 mg via INTRAVENOUS
  Filled 2012-01-26: qty 4

## 2012-01-26 MED ORDER — LIDOCAINE HCL (PF) 1 % IJ SOLN: 5.0000 mL | INTRAMUSCULAR | Status: DC | PRN

## 2012-01-26 MED ORDER — HEPARIN SODIUM (PORCINE) 1000 UNIT/ML IJ SOLN
INTRAMUSCULAR | Status: AC
  Administered 2012-01-26: 1.4 mL
  Filled 2012-01-26: qty 3

## 2012-01-26 MED ORDER — LIDOCAINE-EPINEPHRINE (PF) 1 %-1:200000 IJ SOLN
INTRAMUSCULAR | Status: AC
  Filled 2012-01-26: qty 10

## 2012-01-26 MED ORDER — NEPRO/CARBSTEADY PO LIQD
237.0000 mL | ORAL | Status: DC | PRN
  Filled 2012-01-26: qty 237

## 2012-01-26 NOTE — H&P (Signed)
PULMONARY  / CRITICAL CARE MEDICINE  Name: [name] MRN: [id number] DOB: 1960-04-10    LOS: 1  REFERRING MD :  EDP - Dr. Radford Pax  CHIEF COMPLAINT:  Hyperkalemia, Renal Failure  BRIEF PATIENT DESCRIPTION: 51 y/o AAM with PMH of HTN, Idiopathic dilated cardiomyopathy with BiV ICD who presented to [facility] ED on 11/20 with firing of pacemaker.  Admitted with hyperkalemia, acute on chronic renal failure & firing of AICD.   LINES / TUBES: Aline rt radial 11/20>>>  CULTURES:   ANTIBIOTICS:   SIGNIFICANT EVENTS:  11/20 - Admit with AICD firing, acute renal failure, hyperkalemia 11/21- continued hyperkalemia, requires renal replacement therpay  LEVEL OF CARE:  ICU PRIMARY SERVICE:  PCCM CONSULTANTS:  Nephrology, Cardiology CODE STATUS:  Full Code DIET:  Clear liquid DVT Px:  Heparin GI Px:  None indicated  INTERVAL HISTORY: no distress  VITAL SIGNS: Temp:  [97.4 F (36.3 C)-98.3 F (36.8 C)] 98.1 F (36.7 C) (11/21 0800) Pulse Rate:  [73-96] 78  (11/21 0800) Resp:  [10-26] 22  (11/21 0900) BP: (63-139)/(37-81) 79/56 mmHg (11/21 0700) SpO2:  [92 %-100 %] 96 % (11/21 0800) Weight:  [112 kg (246 lb 14.6 oz)-114 kg (251 lb 5.2 oz)] 114 kg (251 lb 5.2 oz) (11/21 0400)  HEMODYNAMICS:    VENTILATOR SETTINGS:   INTAKE / OUTPUT: Intake/Output      11/20 0701 - 11/21 0700 11/21 0701 - 11/22 0700   P.O.  100   I.V. (mL/kg) 668.8 (5.9) 150 (1.3)   IV Piggyback 504    Total Intake(mL/kg) 1172.8 (10.3) 250 (2.2)   Urine (mL/kg/hr) 2125 (0.8)    Total Output 2125    Net -952.3 +250        Urine Occurrence 6 x    Stool Occurrence 6 x 1 x     PHYSICAL EXAMINATION: General:  wdwn adult male in NAD Neuro:  AAOx4, speech clear HEENT:  Mm pink/moist, no jvd Cardiovascular:  s1s2 regular, wide complex Lungs:  resp's even/non-labored, lungs bilaterally clear Abdomen:  Obese/soft, bsx4 active Musculoskeletal:  No acute deformities Skin:  Warm/dry, no  edema  LABS: cbc  Lab 01/26/12 0200 01/25/12 1808 01/25/12 1534  WBC 13.0* 10.1 8.9  HGB 16.9 16.6 16.0  HCT 47.5 48.5 48.1   chemistry  Lab 01/26/12 0755 01/26/12 0200 01/25/12 2202 01/25/12 1808  NA 136 135 132* --  K >7.5* >7.5* >7.5* --  CO2 18* 17* 18* --  GLUCOSE 118* 118* 98 --  BUN 70* 71* 70* --  CREATININE 2.48* 2.54* 2.23* --  CALCIUM 9.5 10.0 10.2 --  MG -- 1.5 -- 1.5  PHOS 4.8* 4.6 -- 3.9   Liver fxn  Lab 01/26/12 0755 01/25/12 1753  AST -- 51*  ALT -- 34  ALKPHOS -- 65  BILITOT -- 0.5  PROT -- 7.8  ALBUMIN 3.8 3.8   coags  Lab 01/25/12 1808  APTT 37  INR 1.10   Sepsis markers  Lab 01/25/12 1818  LATICACIDVEN 1.2  PROCALCITON --   Cardiac markers No results found for this basename: CKTOTAL:3,CKMB:3,TROPONINI:3 in the last 168 hours BNP  Lab 01/25/12 1817  PROBNP 332.9*   ABG No results found for this basename: PHART:3,PCO2ART:3,PO2ART:3,HCO3:3,TCO2:3 in the last 168 hours  CBG trend No results found for this basename: GLUCAP:5 in the last 168 hours  IMAGING:   11/20 CXR>>>paratracheal mass noted in past, pacer, fluid rt fissure, mild int prom  ECG: 11/20>>>  DIAGNOSES: Active Problems:  SYSTOLIC HEART  FAILURE, CHRONIC  Biventricular ICD-Medtronic  Hyperkalemia  Acute renal failure  Abnormal ICD T wave over sensing   ASSESSMENT / PLAN:  PULMONARY  ASSESSMENT: OSA - on home CPAP, compliant 3-4 times per week  PLAN:   -continue home cpap -noted some edema in fissure, int prom, repeat pcxr in am   CARDIOVASCULAR  ASSESSMENT:  HTN Dilated Idiopathic Cardiomyopathy- s/p BiV ICD placed for V-tach.  Followed by Dr. Graciela Husbands  PLAN:  -Cardiology Consulted -ICU admit, tele monitoring -per cards  RENAL  ASSESSMENT:   Acute on Chronic Renal Failure- followed by Dr. Abel Presto.  Baseline CR around 1.2 Hyperkalemia, acidosis  PLAN:   -Nephrology Consulted -needs cvvhd ro hd -Continue bicarb, avoid normal saline Repeat  chem Place catheter kyxlate given  GASTROINTESTINAL  ASSESSMENT:   Obesity  PLAN:   -Renal diet  HEMATOLOGIC  ASSESSMENT:   No acute issues.   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 Will place catheter now  I have personally obtained a history, examined the patient, evaluated laboratory and imaging results, formulated the assessment and plan and placed orders.  Mcarthur Rossetti. Tyson Alias, MD, FACP Pgr: [phone] [facility]

## 2012-01-26 NOTE — Progress Notes (Signed)
01/26/2012 1210  Dialysis Cathether placement verified by xray and OK'd for use by Dr. Timothy Lasso, [place]ell Fulling

## 2012-01-26 NOTE — Consult Note (Signed)
ELECTROPHYSIOLOGY CONSULT NOTE    Patient ID: [name] MRN: [id number], DOB/AGE: 1960/07/14 51 y.o.  Admit date: 01/25/2012 Date of Consult: 01-26-2012  Primary Physician: Mickie Hillier, MD Primary Cardiologist: Nicholes Mango, MD (CHF) Electrophysiologist: Sherryl Manges, MD  Reason for Consultation: ICD shocks  HPI:  [name] is a 51 year old  Male with a history of non-ischemic cardiomyopathy status post CRT-D implantation in 2010 with subsequent appropriate therapy for VT.  He has done well recently with last appropriate therapy in May of this year (VT treated with ATP, cycle length ).  He was at work yesterday sitting at his desk when he received 5 ICD shocks.  He states that he was not having any dizziness, palpitations, or pre-syncope.  He reports being more "worn out and tired" the past couple of weeks.   He was recently seen in the HF clinic (01-17-12) at which time Bidil was added and he was advised to start taking Lasix daily as opposed to prn.  Lab data is remarkable for Cr 2.54 (baseline 1.41), K > 7.5 (despite sodium bicarb, kayexalate).  Device interrogation reveals inappropriate therapy for T wave oversensing which resulted in 6 shocks.  Device was programmed for VT zone 146-207 and VF zone 207.  The patient's current device does not have T wave oversensing discriminator.  EP has been asked to evaluate.    Past Medical History  Diagnosis Date  . Gout   . Cardiomyopathy     Idiopathic dilated  . Ventricular tachycardia     recurrent with ATP   . biv icd     medtronic   . HTN (hypertension)   . Sleep apnea      Surgical History:  Past Surgical History  Procedure Date  . Cholecystectomy   . Cardiac catheterization     he was found to have normal coronary arteries but with a globally dilated and hypocontractile heart  . US echocardiography 03-17-2008, 07-03-2006    EF 15-20%, EF 15-20%     Prescriptions prior to admission    Medication Sig Dispense Refill  . allopurinol (ZYLOPRIM) 300 MG tablet Take 300 mg by mouth daily.      Marland Kitchen atorvastatin (LIPITOR) 40 MG tablet Take 1 tablet (40 mg total) by mouth daily.  90 tablet  1  . carvedilol (COREG) 25 MG tablet Take 25 mg by mouth 2 (two) times daily with a meal.      . digoxin (LANOXIN) 0.25 MG tablet Take 1/2 tablet by mouth once daily.      . furosemide (LASIX) 40 MG tablet Take 1 tablet (40 mg total) by mouth daily.  30 tablet  11  . isosorbide-hydrALAZINE (BIDIL) 20-37.5 MG per tablet Take 1 tablet by mouth 3 (three) times daily.  90 tablet  3  . olmesartan (BENICAR) 40 MG tablet Take 40 mg by mouth daily.      Marland Kitchen spironolactone (ALDACTONE) 25 MG tablet Take 25 mg by mouth. Taking Three Times a Week        Inpatient Medications:    . furosemide  40 mg Intravenous BID  . heparin subcutaneous  5,000 Units Subcutaneous Q8H  . pantoprazole (PROTONIX) IV  40 mg Intravenous Q24H    Allergies: No Known Allergies  History   Social History  . Marital Status: Married    Spouse Name: N/A    Number of Children: N/A  . Years of Education: N/A   Occupational History  . Not on file.  Social History Main Topics  . Smoking status: Never Smoker   . Smokeless tobacco: Not on file  . Alcohol Use:   . Drug Use:   . Sexually Active:    Other Topics Concern  . Not on file   Social History Narrative  . No narrative on file     History reviewed. No pertinent family history.  Alert and oriented in no acute distress HENT- normal Eyes- EOMI, without scleral icterus Skin- warm and dry; without rashes LN-neg Neck- supple without thyromegaly, JVP-flat, carotids brisk and full without bruits Back-without CVAT or kyphosis Lungs-clear to auscultation Device pocket well healed; without hematoma or erythema CV-Regular rate and rhythm, nl S1 and S2, no murmurs gallops or rubs, S4-absent Abd-soft with active bowel sounds; no midline pulsation or  hepatomegaly Pulses-intact femoral and distal MKS-without gross deformity Neuro- Ax O, CN3-12 intact, grossly normal motor and sensory function Affect engaging   Labs:   Lab Results  Component Value Date   WBC 13.0* 01/26/2012   HGB 16.9 01/26/2012   HCT 47.5 01/26/2012   MCV 88.0 01/26/2012   PLT 195 01/26/2012     Lab 01/26/12 0200 01/25/12 1753  NA 135 --  K >7.5* --  CL 109 --  CO2 17* --  BUN 71* --  CREATININE 2.54* --  CALCIUM 10.0 --  PROT -- 7.8  BILITOT -- 0.5  ALKPHOS -- 65  ALT -- 34  AST -- 51*  GLUCOSE 118* --    Radiology/Studies: Dg Chest Port 1 View 01/25/2012  *RADIOLOGY REPORT*  Clinical Data: Defibrillator fired multiple times today.  PORTABLE CHEST - 1 VIEW  Comparison: 07/03/2009 and 04/12/2008.  CT chest 04/03/2008.  Findings: Trachea is midline.  Heart is enlarged.  Pacemaker and AICD lead tips appear stable in position.  Right paratracheal mass appears larger than on 04/12/2008. Possible minimal right basilar atelectasis.  No pleural fluid.  IMPRESSION:  1.  Right paratracheal mass appears larger than on 04/12/2008, which may be due to AP semi upright technique.  PA and lateral views of the chest could be performed in further initial evaluation, as clinically indicated. 2.  Minimal right basilar atelectasis.   Original Report Authenticated By: Leanna Battles, M.D.    TELEMETRY: sinus rhythm with CRT pacing  DEVICE HISTORY: Medtronic Concerto CRT-D implanted    Patient Active Hospital Problem List: SYSTOLIC HEART FAILURE, CHRONIC (10/20/2008)   Biventricular ICD-Medtronic (05/17/2011)      Assessment:  *Hyperkalemia (01/25/2012)   Acute renal failure (01/25/2012)   Abnormal ICD T wave over sensing (01/26/2012)    Pt with hyperkalemia assoc twave oversensing resulting in inappropriate shocks.  Hyperkalemia likely 2/2 acute renal insufficiency assoc with med changes with decrease BP   For now getting K down is key;  Magnet applied to prevent  inappropriate shocks.  Will need CHF clinic support to restructure meds; BP  This am is limiting and would prob wait until Cr reaches [place]og[place] prior to resuming meds but will defer to Dr DB  Await repeat K  And have discussed plans with renal.  With device inactive, if K does not come down, what is risk benefit of kayexalate vs dialysis

## 2012-01-26 NOTE — Progress Notes (Signed)
MD notified of pt declining BP 64/42. MD ordered another 250 mL bolus of NS. VSS. Pt states "I feel worn out". Will continue to monitor.

## 2012-01-26 NOTE — Procedures (Signed)
Arterial Catheter Insertion Procedure Note [name] [id number] [birth year]  Procedure: Insertion of Arterial Catheter  Indications: Blood pressure monitoring  Procedure Details Consent: Risks of procedure as well as the alternatives and risks of each were explained to the (patient/caregiver).  Consent for procedure obtained. Time Out: Verified patient identification, verified procedure, site/side was marked, verified correct patient position, special equipment/implants available, medications/allergies/relevent history reviewed, required imaging and test results available.  Performed  Maximum sterile technique was used including antiseptics, gloves, hand hygiene and sheet. Skin prep: Chlorhexidine; local anesthetic administered 22 gauge catheter was inserted into right radial artery using the Seldinger technique.  Evaluation Blood flow good; BP tracing good. Complications: No apparent complications.   Augustine Radar 01/26/2012

## 2012-01-26 NOTE — Progress Notes (Signed)
eLink Physician-Brief Progress Note Patient Name: [name] DOB: [birth year] MRN: [id number]  Date of Service  01/26/2012   HPI/Events of Note   Continued hyperkalemia in the setting of renal insufficiency and bicarb of 18  eICU Interventions  Plan: Repeat kayexalate 60 gm Change to Bicarb gtt   Intervention Category Major Interventions: Electrolyte abnormality - evaluation and management Intermediate Interventions: Other: (acid/base)  [name] 01/26/2012, 3:33 AM

## 2012-01-26 NOTE — Progress Notes (Signed)
Patient ID: [name], male   DOB: 1960-08-09, 51 y.o.   MRN: [id number]   [facility] Progress Note    Subjective:   Reports to be feeling better, denies any chest pain. Reports several bowel movements in response to Kayexalate overnight.    Objective:   BP 79/56  Pulse 77  Temp 97.6 F (36.4 C) (Oral)  Resp 17  Ht 5\' 10"  (1.778 m)  Wt 114 kg (251 lb 5.2 oz)  BMI 36.06 kg/m2  SpO2 95%  Intake/Output Summary (Last 24 hours) at 01/26/12 0751 Last data filed at 01/26/12 0700  Gross per 24 hour  Intake 1172.75 ml  Output   2125 ml  Net -952.25 ml   Weight change:   Physical Exam: Gen: Comfortably resting in bed, alert/oriented CVS: Pulse regular in rate and rhythm, heart sounds S1 and S2 normal Resp: Clear to auscultation bilaterally, no rales/rhonchi Abd: Soft, obese, nontender and bowel sounds are normal Ext: No lower extremity edema appreciated.  Imaging: Dg Chest Port 1 View  01/25/2012  *RADIOLOGY REPORT*  Clinical Data: Defibrillator fired multiple times today.  PORTABLE CHEST - 1 VIEW  Comparison: 07/03/2009 and 04/12/2008.  CT chest 04/03/2008.  Findings: Trachea is midline.  Heart is enlarged.  Pacemaker and AICD lead tips appear stable in position.  Right paratracheal mass appears larger than on 04/12/2008. Possible minimal right basilar atelectasis.  No pleural fluid.  IMPRESSION:  1.  Right paratracheal mass appears larger than on 04/12/2008, which may be due to AP semi upright technique.  PA and lateral views of the chest could be performed in further initial evaluation, as clinically indicated. 2.  Minimal right basilar atelectasis.   Original Report Authenticated By: Leanna Battles, M.D.     Labs: BMET  Lab 01/26/12 0200 01/25/12 2202 01/25/12 1808 01/25/12 1753 01/25/12 1534  NA 135 132* -- 128* 130*  K >7.5* >7.5* -- >7.5* >7.5*  CL 109 107 -- 104 108  CO2 17* 18* -- 18* 14*  GLUCOSE 118* 98 -- 273* 95  BUN 71* 70* -- 73* 73*    CREATININE 2.54* 2.23* -- 2.38* 2.47*  ALB -- -- -- -- --  CALCIUM 10.0 10.2 -- 9.7 9.7  PHOS 4.6 -- 3.9 -- --   CBC  Lab 01/26/12 0200 01/25/12 1808 01/25/12 1534  WBC 13.0* 10.1 8.9  NEUTROABS -- 7.9* --  HGB 16.9 16.6 16.0  HCT 47.5 48.5 48.1  MCV 88.0 90.7 90.9  PLT 195 180 182    Medications:      . [COMPLETED] calcium gluconate 1 GM IV  1 g Intravenous Once  . [COMPLETED] calcium gluconate 1 GM IV  1 g Intravenous Once  . [COMPLETED] calcium gluconate      . [COMPLETED] dextrose  1 ampule Intravenous Once  . [COMPLETED] dextrose  1 ampule Intravenous Once  . [COMPLETED] dextrose      . furosemide  40 mg Intravenous BID  . heparin subcutaneous  5,000 Units Subcutaneous Q8H  . [COMPLETED] insulin aspart  10 Units Intravenous Once  . [COMPLETED] insulin aspart  10 Units Intravenous Once  . pantoprazole (PROTONIX) IV  40 mg Intravenous Q24H  . [COMPLETED] sodium bicarbonate  50 mEq Intravenous Once  . [COMPLETED] sodium chloride  250 mL Intravenous Once  . [COMPLETED] sodium chloride  250 mL Intravenous Once  . [COMPLETED] sodium chloride      . [COMPLETED] sodium polystyrene  60 g Oral Once  . [COMPLETED] sodium polystyrene  60 g Oral Once  . [DISCONTINUED] albuterol  2.5 mg Nebulization Once     Assessment/ Plan:   1. Acute renal failure on chronic kidney disease stage III (IgA nephropathy): Suspected volume contraction with evolution into ischemic ATN. Excellent urine output noted overnight, we'll continue to monitor closely anticipating renal recovery. Benicar and Aldactone appropriately stopped. 2. Hyperkalemia: critical. Medical management efforts noted overnight with intracellular shifts/Kayexalate. Several bowel movements since Kayexalate was given earlier this morning. We'll check a stat lab to come firm current level of potassium and decide on need for further intervention including reassessing need for dialysis. If he needs dialysis in the face of hypotension,  will require CRRT. 3. Hypotension: Fair blood pressures on maintenance normal saline at 75 cc an hour, no indications at this time for pressor therapy. 4. Anion gap metabolic acidosis: Secondary to acute renal failure, monitor with fluid resuscitation.  Zetta Bills, MD 01/26/2012, 7:51 AM

## 2012-01-26 NOTE — Care Management Note (Signed)
    Page 1 of 1   01/26/2012     9:33:52 AM   CARE MANAGEMENT NOTE 01/26/2012  Patient:  [name], [name]   Account Number:  [id number]  Date Initiated:  01/26/2012  Documentation initiated by:  Junius Creamer  Subjective/Objective Assessment:   adm w icd firing , hyperkalemia     Action/Plan:   lives w wife, pcp dr Caryn Bee little   Anticipated DC Date:     Anticipated DC Plan:  HOME/SELF CARE      DC Planning Services  CM consult      Choice offered to / List presented to:             Status of service:   Medicare Important Message given?   (If response is "NO", the following Medicare IM given date fields will be blank) Date Medicare IM given:   Date Additional Medicare IM given:    Discharge Disposition:  HOME/SELF CARE  Per UR Regulation:  Reviewed for med. necessity/level of care/duration of stay  If discussed at Long Length of Stay Meetings, dates discussed:    Comments:  11/21 9:33a debbie Karalee Hauter rn,bsn [phone]

## 2012-01-26 NOTE — Progress Notes (Signed)
CRITICAL VALUE ALERT  Critical value received:  Potassium Level   Date of notification:  01/25/2012  Time of notification:  0327  Critical value read back:yes  Nurse who received alert:  Sherry Ruffing, RN  MD notified (1st page):  Deterding, MD  Time of first page:  0327  MD notified (2nd page):  Time of second page:  Responding MD:  Deterding, MD  Time MD responded:  [phone]

## 2012-01-26 NOTE — Procedures (Signed)
Central Venous Catheter Insertion Procedure Note [name] [id number] 31-Mar-1960  Procedure: Insertion of Central Venous Catheter Indications: urgent, temporary dialysis  Procedure Details Consent: Risks of procedure as well as the alternatives and risks of each were explained to the (patient/caregiver).  Consent for procedure obtained. Time Out: Verified patient identification, verified procedure, site/side was marked, verified correct patient position, special equipment/implants available, medications/allergies/relevent history reviewed, required imaging and test results available.  Performed  Maximum sterile technique was used including antiseptics, cap, gloves, gown, hand hygiene, mask and sheet. Skin prep: Chlorhexidine; local anesthetic administered A antimicrobial bonded/coated triple lumen HD catheter was placed in the right internal jugular vein using the Seldinger technique.  Evaluation Blood flow good Complications: No apparent complications Patient did tolerate procedure well. Chest X-ray ordered to verify placement.  CXR: pending.  PRIBULA,CHRISTOPHER 01/26/2012, 11:27 AM  I perfromed this line in full with resident [place] gudiance  Mcarthur Rossetti. Tyson Alias, MD, FACP Pgr: [phone] [facility]

## 2012-01-27 ENCOUNTER — Inpatient Hospital Stay (HOSPITAL_COMMUNITY): Payer: 59

## 2012-01-27 ENCOUNTER — Encounter (HOSPITAL_COMMUNITY)
Admission: EM | Disposition: A | Discharge status: Home / Self Care | Payer: Self-pay | Source: Home / Self Care | Attending: Internal Medicine

## 2012-01-27 DIAGNOSIS — R7401 Elevation of levels of liver transaminase levels: Secondary | ICD-10-CM

## 2012-01-27 DIAGNOSIS — I509 Heart failure, unspecified: Secondary | ICD-10-CM

## 2012-01-27 HISTORY — PX: RIGHT HEART CATHETERIZATION: SHX5447

## 2012-01-27 LAB — POCT I-STAT 3, VENOUS BLOOD GAS (G3P V)
Acid-Base Excess: 2 mmol/L (ref 0.0–2.0)
Bicarbonate: 28.3 mEq/L — ABNORMAL HIGH (ref 20.0–24.0)
Bicarbonate: 28.7 mEq/L — ABNORMAL HIGH (ref 20.0–24.0)
O2 Saturation: 64 %
TCO2: 30 mmol/L (ref 0–100)
pCO2, Ven: 48.8 mmHg (ref 45.0–50.0)
pCO2, Ven: 49.6 mmHg (ref 45.0–50.0)
pH, Ven: 7.371 — ABNORMAL HIGH (ref 7.250–7.300)
pO2, Ven: 34 mmHg (ref 30.0–45.0)

## 2012-01-27 LAB — COMPREHENSIVE METABOLIC PANEL
AST: 297 U/L — ABNORMAL HIGH (ref 0–37)
Albumin: 3 g/dL — ABNORMAL LOW (ref 3.5–5.2)
BUN: 35 mg/dL — ABNORMAL HIGH (ref 6–23)
Chloride: 100 mEq/L (ref 96–112)
Creatinine, Ser: 2.03 mg/dL — ABNORMAL HIGH (ref 0.50–1.35)
Potassium: 4.1 mEq/L (ref 3.5–5.1)
Total Bilirubin: 1.1 mg/dL (ref 0.3–1.2)
Total Protein: 6.4 g/dL (ref 6.0–8.3)

## 2012-01-27 LAB — CBC WITH DIFFERENTIAL/PLATELET
Basophils Absolute: 0 10*3/uL (ref 0.0–0.1)
Basophils Relative: 0 % (ref 0–1)
Eosinophils Absolute: 0.1 10*3/uL (ref 0.0–0.7)
MCH: 30.2 pg (ref 26.0–34.0)
MCHC: 34 g/dL (ref 30.0–36.0)
Monocytes Absolute: 1.5 10*3/uL — ABNORMAL HIGH (ref 0.1–1.0)
Neutro Abs: 8.4 10*3/uL — ABNORMAL HIGH (ref 1.7–7.7)
Neutrophils Relative %: 74 % (ref 43–77)
RDW: 15.1 % (ref 11.5–15.5)

## 2012-01-27 LAB — URINALYSIS, ROUTINE W REFLEX MICROSCOPIC
Glucose, UA: NEGATIVE mg/dL
Protein, ur: 100 mg/dL — AB
pH: 6 (ref 5.0–8.0)

## 2012-01-27 LAB — URINE MICROSCOPIC-ADD ON

## 2012-01-27 LAB — POCT ACTIVATED CLOTTING TIME: Activated Clotting Time: 134 seconds

## 2012-01-27 LAB — HEPATITIS B CORE ANTIBODY, TOTAL: Hep B Core Total Ab: NEGATIVE

## 2012-01-27 SURGERY — RIGHT HEART CATH
Anesthesia: LOCAL

## 2012-01-27 MED ORDER — FUROSEMIDE 40 MG PO TABS
40.0000 mg | ORAL_TABLET | Freq: Every day | ORAL | Status: DC
  Administered 2012-01-27: 40 mg via ORAL
  Filled 2012-01-27 (×2): qty 1

## 2012-01-27 MED ORDER — ASPIRIN 81 MG PO CHEW
324.0000 mg | CHEWABLE_TABLET | ORAL | Status: AC
  Administered 2012-01-27: 324 mg via ORAL
  Filled 2012-01-27: qty 4

## 2012-01-27 MED ORDER — SODIUM CHLORIDE 0.9 % IJ SOLN: 3.0000 mL | Freq: Two times a day (BID) | INTRAMUSCULAR | Status: DC

## 2012-01-27 MED ORDER — SODIUM CHLORIDE 0.9 % IJ SOLN: 3.0000 mL | INTRAMUSCULAR | Status: DC | PRN

## 2012-01-27 MED ORDER — SODIUM CHLORIDE 0.9 % IV SOLN: INTRAVENOUS | Status: DC

## 2012-01-27 MED ORDER — SODIUM CHLORIDE 0.9 % IV SOLN: 250.0000 mL | INTRAVENOUS | Status: DC | PRN

## 2012-01-27 NOTE — Progress Notes (Signed)
ELECTROPHYSIOLOGY ROUNDING NOTE    Patient Name: [name] Date of Encounter: 01-27-2012    SUBJECTIVE: Feels okay.  Tired.  No chest pain or shortness of breath.   Patient admitted 01-25-2012 for inappropriate ICD shocks related to T wave oversensing, acute renal failure, and hyperkalemia.  Received Kayexalate, sodium bicarb, dialyzed through right IJ.  K today 4.1.  Newly elevated LFT's this morning. Hep B antigen positive. Creatinine improved from 2.54 -->2.03.    TELEMETRY: Reviewed telemetry pt in sinus rhythm/sinus tach with ventricular pacing, PVC's, short run NSVT Filed Vitals:   01/27/12 0100 01/27/12 0200 01/27/12 0300 01/27/12 0400  BP: 103/54 85/35 90/44  96/56  Pulse: 119 115 115 107  Temp:    100.1 F (37.8 C)  TempSrc:    Oral  Resp: 25 25 22 21   Height:    5\' 10"  (1.778 m)  Weight:    249 lb 1.9 oz (113 kg)  SpO2: 94% 92% 95% 94%    Well developed and nourished in no acute distress HENT normal Neck supple with JVP-flat Catheter in place RIJ Clear Regular rate and rhythm, no murmurs or gallops Abd-soft with active BS No Clubbing cyanosis edema Skin-warm and dry A & Oriented  Grossly normal sensory and motor function    Intake/Output Summary (Last 24 hours) at 01/27/12 0634 Last data filed at 01/27/12 0400  Gross per 24 hour  Intake   2469 ml  Output   1155 ml  Net   1314 ml    LABS: Basic Metabolic Panel:  Basename 01/27/12 0520 01/26/12 1935 01/26/12 0755 01/26/12 0200 01/25/12 1808  NA 139 135 -- -- --  K 4.1 3.9 -- -- --  CL 100 98 -- -- --  CO2 28 26 -- -- --  GLUCOSE 112* 126* -- -- --  BUN 35* 36* -- -- --  CREATININE 2.03* 1.84* -- -- --  CALCIUM 7.9* 9.0 -- -- --  MG -- -- -- 1.5 1.5  PHOS -- -- 4.8* 4.6 --   Liver Function Tests:  Basename 01/27/12 0520 01/26/12 1256 01/26/12 0755 01/25/12 1753  AST 297* -- -- 51*  ALT 338* 55* -- --  ALKPHOS 75 -- -- 65  BILITOT 1.1 -- -- 0.5  PROT 6.4 -- -- 7.8  ALBUMIN 3.0* --  3.8 --   CBC:  Basename 01/27/12 0520 01/26/12 0200 01/25/12 1808  WBC 11.3* 13.0* --  NEUTROABS 8.4* -- 7.9*  HGB 15.0 16.9 --  HCT 44.1 47.5 --  MCV 88.9 88.0 --  PLT 148* 195 --   Thyroid Function Tests:  Basename 01/25/12 1808  TSH 2.260  T4TOTAL --  T3FREE --  THYROIDAB --    Radiology/Studies: Dg Chest Port 1 View 01/26/2012  *RADIOLOGY REPORT*  Clinical Data: 51 year old male new right central line placement. Dialysis.  PORTABLE CHEST - 1 VIEW  Comparison: 01/25/2012 and earlier.  Findings: Semi upright AP portable view 1156 hours.  New right IJ dual lumen dialysis type catheter placed.  Catheter tip at the lower SVC level.  No pneumothorax.  Superimposed abnormal right paratracheal/superior mediastinal contour, seen to be chronic since 2008.  Stable cardiac size and mediastinal contours.  Stable left chest cardiac AICD.  Mildly improved ventilation at the lung bases and stable ventilation elsewhere.  IMPRESSION: 1.  Right IJ dual lumen catheter placed.  No pneumothorax and catheter tip at the level of the lower SVC. 2.  Mildly improved basilar ventilation.  No acute cardiopulmonary abnormality with  chronic right paratracheal mass.   Original Report Authenticated By: Erskine Speed, M.D.      Patient Active Hospital Problem List: SYSTOLIC HEART FAILURE, CHRONIC (10/20/2008)  Biventricular ICD-Medtronic (05/17/2011)  Hyperkalemia (01/25/2012)  Acute renal failure (01/25/2012)  Abnormal ICD T wave over sensing (01/26/2012)  BP is low and this may be some stuning from multiple shocks;  We will need to resume CHF meds and will await in put from CHF team as to sequence and final choices.  Aldactone prob not a good option going forward.    Remove dialysis cathter and transfer to step down  thnks for all help*

## 2012-01-27 NOTE — Progress Notes (Signed)
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 LHC 2008: Normal cors   RHC 04/2008  RA pressure is 6  RV pressure is 35/5,  PCWP 7  PAP 39/7 with a mean pressure of 23  CO/CI 4.6/ 2.04 L per minute. Fick CO 4.74 L per minute CI 2.08.  11/29/11 ECHO EF 15% Diffuse hypokinesis. Features are consistent with a pseudonormal left ventricular filling pattern, with concomitant abnormal relaxation and increased filling pressure (grade 2 diastolic dysfunction).   Initial visit in HF clinic 01/17/12 At that time volume status was ok and BIDIL was added 20/37.5 mg tid and CPX was ordered.   He presented presented to [facility] ED on 11/20 with firing of ICD 5 times. Admitted with hyperkalemia, acute on chronic renal failure & firing of AICD. Potassium > 7.5 Creatinine 2.54. , Magnesium 1.5 . Nephrology consulted and stopped Benicar and Aldactone. Also given Kayexalate without improvement in potassium. 01/26/12 required dialysis. Potassium improved and dialysis catheter was removed 01/27/12.   Potassium 7.5>3.9>4.1  Creatinine 2.54>2.48>1.84>2.03   Denies SOB/PND/Orthopnea     Intake/Output Summary (Last 24 hours) at 01/27/12 1018 Last data filed at 01/27/12 0900  Gross per 24 hour  Intake   2234 ml  Output   1305 ml  Net    929 ml    Current meds:    . furosemide  40 mg Oral Daily  . [COMPLETED] heparin      . heparin subcutaneous  5,000 Units Subcutaneous Q8H  . pantoprazole (PROTONIX) IV  40 mg Intravenous Q24H  . [DISCONTINUED] furosemide  40 mg Intravenous BID   Infusions:    . albuterol 10 mg/hr (01/25/12 1736)  . [DISCONTINUED]  sodium bicarbonate infusion 1000 mL 75 mL/hr at 01/27/12 0400     Objective:  Blood pressure 107/73, pulse 99, temperature 98.7 F (37.1 C), temperature source Oral, resp. rate 26,  height 5\' 10"  (1.778 m), weight 249 lb 1.9 oz (113 kg), SpO2 95.00%. Weight change: 7 lb 4.4 oz (3.3 kg)  Physical Exam: General:  Well appearing. No resp difficulty HEENT: normal Neck: supple. JVP hard to tell but does not appear elevated. Carotids 2+ bilat; no bruits. No lymphadenopathy or thryomegaly appreciated. Cor: PMI nondisplaced. Regular rate & rhythm. No rubs, gallops or murmurs. Lungs: clear Abdomen: Obese, soft, nontender, nondistended. No hepatosplenomegaly. No bruits or masses. Good bowel sounds. Extremities: no cyanosis, clubbing, rash, edema Neuro: alert & orientedx3, cranial nerves grossly intact. moves all 4 extremities w/o difficulty. Affect pleasant  Telemetry: SR  Lab Results: Basic Metabolic Panel:  Lab 01/27/12 1610 01/26/12 1935 01/26/12 0755 01/26/12 0200 01/25/12 2202 01/25/12 1808  NA 139 135 136 135 132* --  K 4.1 3.9 -- -- -- --  CL 100 98 110 109 107 --  CO2 28 26 18* 17* 18* --  GLUCOSE 112* 126* 118* 118* 98 --  BUN 35* 36* 70* 71* 70* --  CREATININE 2.03* 1.84* 2.48* 2.54* 2.23* --  CALCIUM 7.9* 9.0 9.5 10.0 10.2 --  MG -- -- -- 1.5 -- 1.5  PHOS -- -- 4.8* 4.6 -- 3.9   Liver Function Tests:  Lab 01/27/12 0520 01/26/12 1256 01/26/12 0755 01/25/12 1753  AST 297* -- -- 51*  ALT 338* 55* -- 34  ALKPHOS 75 -- -- 65  BILITOT 1.1 -- -- 0.5  PROT  6.4 -- -- 7.8  ALBUMIN 3.0* -- 3.8 3.8   No results found for this basename: LIPASE:5,AMYLASE:5 in the last 168 hours No results found for this basename: AMMONIA:5 in the last 168 hours CBC:  Lab 01/27/12 0520 01/26/12 0200 01/25/12 1808 01/25/12 1534  WBC 11.3* 13.0* 10.1 8.9  NEUTROABS 8.4* -- 7.9* --  HGB 15.0 16.9 16.6 16.0  HCT 44.1 47.5 48.5 48.1  MCV 88.9 88.0 90.7 90.9  PLT 148* 195 180 182   Cardiac Enzymes: No results found for this basename: CKTOTAL:5,CKMB:5,CKMBINDEX:5,TROPONINI:5 in the last 168 hours BNP: No components found with this basename: POCBNP:5 CBG: No results found for  this basename: GLUCAP:5 in the last 168 hours Microbiology: No results found for this basename: cult   No results found for this basename: CULT:2,SDES:2 in the last 168 hours  Imaging: Dg Chest Port 1 View  01/27/2012  *RADIOLOGY REPORT*  Clinical Data: Assess edema  PORTABLE CHEST - 1 VIEW  Comparison: Portable chest x-ray of 01/26/2012  Findings: Cardiomegaly is stable.  The lungs remain well aerated. A defibrillator is unchanged in position and right central venous line remains.  The right paratracheal soft tissue mass is stable.  IMPRESSION: Stable chest x-ray.  No active lung disease.   Original Report Authenticated By: Dwyane Dee, M.D.    Dg Chest Port 1 View  01/26/2012  *RADIOLOGY REPORT*  Clinical Data: 51 year old male new right central line placement. Dialysis.  PORTABLE CHEST - 1 VIEW  Comparison: 01/25/2012 and earlier.  Findings: Semi upright AP portable view 1156 hours.  New right IJ dual lumen dialysis type catheter placed.  Catheter tip at the lower SVC level.  No pneumothorax.  Superimposed abnormal right paratracheal/superior mediastinal contour, seen to be chronic since 2008.  Stable cardiac size and mediastinal contours.  Stable left chest cardiac AICD.  Mildly improved ventilation at the lung bases and stable ventilation elsewhere.  IMPRESSION: 1.  Right IJ dual lumen catheter placed.  No pneumothorax and catheter tip at the level of the lower SVC. 2.  Mildly improved basilar ventilation.  No acute cardiopulmonary abnormality with chronic right paratracheal mass.   Original Report Authenticated By: Erskine Speed, M.D.    Dg Chest Port 1 View  01/25/2012  *RADIOLOGY REPORT*  Clinical Data: Defibrillator fired multiple times today.  PORTABLE CHEST - 1 VIEW  Comparison: 07/03/2009 and 04/12/2008.  CT chest 04/03/2008.  Findings: Trachea is midline.  Heart is enlarged.  Pacemaker and AICD lead tips appear stable in position.  Right paratracheal mass appears larger than on 04/12/2008.  Possible minimal right basilar atelectasis.  No pleural fluid.  IMPRESSION:  1.  Right paratracheal mass appears larger than on 04/12/2008, which may be due to AP semi upright technique.  PA and lateral views of the chest could be performed in further initial evaluation, as clinically indicated. 2.  Minimal right basilar atelectasis.   Original Report Authenticated By: Leanna Battles, M.D.      ASSESSMENT:   1. ICD shocks due to oversensing T waves in setting of hyperkalemia 2. Hyperkalemia 3. A/C systolic heart failure (EF 15% 4. Acute/Chronic renal failure stage 3 - baseline 1.8-2.0 5. OSA- CPAP nightly 6. Gout 7. Obesity  PLAN/DISCUSSION:    LOS: 2 days    CLEGG,AMY, NP 01/27/2012, 10:18 AM  Patient seen and examined with Tonye Becket, NP. We discussed all aspects of the encounter. I agree with the assessment and plan as stated above. Chart reviewed at length.  He has  severe LV dysfunction with intolerance of almost any meds due to low BP and hypotension. Now with worsening renal function and hypokalemia. I suspect he has low output. Will plan RHC today to assess. May need inotropes.   Daniel Bensimhon,MD 11:30 AM

## 2012-01-27 NOTE — Progress Notes (Signed)
PULMONARY  / CRITICAL CARE MEDICINE  Name: BLYTHE HARTSHORN MRN: [id number] DOB: February 11, 1961    LOS: 2  REFERRING MD :  EDP - Dr. Radford Pax  CHIEF COMPLAINT:  Hyperkalemia, Renal Failure  BRIEF PATIENT DESCRIPTION: 51 y/o AAM with PMH of HTN, Idiopathic dilated cardiomyopathy with BiV ICD who presented to [facility] ED on 11/20 with firing of pacemaker.  Admitted with hyperkalemia, acute on chronic renal failure & firing of AICD.   LINES / TUBES: Aline rt radial 11/20>>> 11/22 Right IJ HD cath 11/21>>>11/22  CULTURES: None  ANTIBIOTICS: None  SIGNIFICANT EVENTS:  11/20 - Admit with AICD firing, acute renal failure, hyperkalemia 11/21- continued hyperkalemia, requires renal replacement therapy, HD cath placed and Dialysis done 11/22: HD cath removed.   LEVEL OF CARE:  ICU --> SDU PRIMARY SERVICE:  PCCM --> [facility] CONSULTANTS:  Nephrology, Cardiology CODE STATUS:  Full Code DIET:  Clear liquid DVT Px:  Heparin GI Px:  None indicated  INTERVAL HISTORY: Doing well this AM.  Had 2 BM with the Kayexalate.  Tolerated Dialysis well.  No chest pain, nausea, vomiting, or palpitations.    VITAL SIGNS: Temp:  [98.3 F (36.8 C)-100.1 F (37.8 C)] 100.1 F (37.8 C) (11/22 0400) Pulse Rate:  [55-119] 99  (11/22 0600) Resp:  [11-28] 19  (11/22 0600) BP: (85-146)/(35-75) 92/55 mmHg (11/22 0600) SpO2:  [92 %-98 %] 95 % (11/22 0600) Weight:  [249 lb 1.9 oz (113 kg)-254 lb 3.1 oz (115.3 kg)] 249 lb 1.9 oz (113 kg) (11/22 0400)  INTAKE / OUTPUT: Intake/Output      11/21 0701 - 11/22 0700 11/22 0701 - 11/23 0700   P.O. 580    I.V. (mL/kg) 1875 (16.6)    IV Piggyback 14    Total Intake(mL/kg) 2469 (21.8)    Urine (mL/kg/hr) 1700 (0.6)    Other -445    Total Output 1255    Net +1214         Urine Occurrence 3 x    Stool Occurrence 2 x      PHYSICAL EXAMINATION: General:  wdwn adult male in NAD Neuro:  AAOx4, speech clear HEENT:  Mm pink/moist, no jvd Cardiovascular:  s1s2 regular, wide  complex Lungs:  resp's even/non-labored, lungs bilaterally clear Abdomen:  Obese/soft, bsx4 active Musculoskeletal:  No acute deformities Skin:  Warm/dry, no edema  LABS: cbc  Lab 01/27/12 0520 01/26/12 0200 01/25/12 1808  WBC 11.3* 13.0* 10.1  HGB 15.0 16.9 16.6  HCT 44.1 47.5 48.5   chemistry  Lab 01/27/12 0520 01/26/12 1935 01/26/12 0755 01/26/12 0200 01/25/12 1808  NA 139 135 136 -- --  K 4.1 3.9 >7.5* -- --  CO2 28 26 18* -- --  GLUCOSE 112* 126* 118* -- --  BUN 35* 36* 70* -- --  CREATININE 2.03* 1.84* 2.48* -- --  CALCIUM 7.9* 9.0 9.5 -- --  MG -- -- -- 1.5 1.5  PHOS -- -- 4.8* 4.6 3.9   Liver fxn  Lab 01/27/12 0520 01/26/12 1256 01/26/12 0755 01/25/12 1753  AST 297* -- -- 51*  ALT 338* 55* -- 34  ALKPHOS 75 -- -- 65  BILITOT 1.1 -- -- 0.5  PROT 6.4 -- -- 7.8  ALBUMIN 3.0* -- 3.8 3.8   coags  Lab 01/25/12 1808  APTT 37  INR 1.10   Sepsis markers  Lab 01/25/12 1818  LATICACIDVEN 1.2  PROCALCITON --   BNP  Lab 01/25/12 1817  PROBNP 332.9*   IMAGING:   11/20  CXR>>>paratracheal mass noted in past, pacer, fluid rt fissure, mild int prom 11/22 CXR --> Stable no active lung disease.   ECG: 11/20>>>atrial sensed V paced.   DIAGNOSES: Active Problems:  SYSTOLIC HEART FAILURE, CHRONIC  Biventricular ICD-[employer]  Hyperkalemia  Acute renal failure  Abnormal ICD T wave over sensing  Elevated transaminase level  ASSESSMENT / PLAN:  PULMONARY  ASSESSMENT: OSA - on home CPAP, compliant 3-4 times per week -noted some edema in fissure, int prom on 11/21.  Repeat CXR clear following dialysis.   PLAN:   -continue home cpap pcxr no overt failure, no distress  CARDIOVASCULAR  ASSESSMENT:  HTN Dilated Idiopathic Cardiomyopathy- s/p BiV ICD placed for V-tach.  Followed by Dr. Graciela Husbands.  Cards consulted and following. BP mildly lower even with holding medications.  Likely from ICD shocks.  Magnet in place currently.  CHF team will see as well per  cards note.  Will need reprogramming prior to discharge.     PLAN:  Continue to hold HF meds Restart Lasix per Renal once daily. Hold aldactone. Tele K corrected    RENAL  ASSESSMENT:   Acute on Chronic Renal Failure:  followed by Dr. Abel Presto.  Baseline CR around 1.2, renal consulted and dialyzed yesterday for hyperkalemia that failed medical therapy.  Acidosis also corrected with dialysis.  Likely secondary to ATN from volume contraction and ischemia.    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  Does not appear to progressed at this point.   PLAN:   Change Bicarb to NS today, kvo or SL Monitor Bmet daily Lasix 40 mg PO at home dose.  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     PLAN:   -pcxr noted, may need ct of this mass / vs [place] pre dc, re assess growth as outpatient.   NEUROLOGIC  ASSESSMENT:  Pain -related to firing of AICD  PLAN:   -monitor, correct cause cosnider to chair  CLINICAL SUMMARY: 51 y/o M admitted with acute on chronic renal failure and associated hyperkalemia (K >7.5) on admission.  Dialyzed x1 with correction.  Will need alterations of his medications per Cards and Renal. Plan to transfer to SDU today under [facility] with Cards and Renal following.    Will sign off  PRIBULA,CHRISTOPHER, MD Internal Medicine Resident Pager: [phone] 01/27/2012 9:26 AM   I have fully examined this patient and agree with above findings.    And edite din full  Mcarthur Rossetti. Tyson Alias,  MD, FACP Pgr: [phone] [facility]

## 2012-01-27 NOTE — CV Procedure (Signed)
Cardiac Cath Procedure Note:  Indication:  Heart failure  Procedures performed:  1) Right heart catheterization  Description of procedure:   The risks and indication of the procedure were explained. Consent was signed and placed on the chart. An appropriate timeout was taken prior to the procedure. The right groin was prepped and draped in the routine sterile fashion and anesthetized with 1% local lidocaine.   A 7 FR venous sheath was placed in the right femoral vein using a modified Seldinger technique. A standard Swan-Ganz catheter was used for the procedure.   Complications: None apparent.  Findings:  RA = 1 RV = 28/2/4 PA = 32/17 (22)  PCW = 6 Fick cardiac output/index = 5.1/2.2 Thermo cardiac output/index = 5.0/2.1 PVR = 4.0 Woods FA sat = 98% PA sat = 64%, 68%  Assessment: 1. Hypovolemia 2. Normal cardiac ouptut  Plan/Discussion:  Filling pressures are low but cardiac output surprisingly normal. Will hold diuretics for a day and resume. Will need CPX testing at some point in near future to assess cardiac reserve.   Kalyssa Anker 3:48 PM

## 2012-01-27 NOTE — Progress Notes (Signed)
Patient ID: [name], male   DOB: 1960/10/14, 51 y.o.   MRN: [id number]   [facility] Progress Note    Subjective:   Reports to be feeling well, tolerated dialysis yesterday without problem.    Objective:   BP 92/55  Pulse 99  Temp 100.1 F (37.8 C) (Oral)  Resp 19  Ht 5\' 10"  (1.778 m)  Wt 113 kg (249 lb 1.9 oz)  BMI 35.74 kg/m2  SpO2 95%  Intake/Output Summary (Last 24 hours) at 01/27/12 2841 Last data filed at 01/27/12 0600  Gross per 24 hour  Intake   2319 ml  Output   1255 ml  Net   1064 ml   Weight change: 3.3 kg (7 lb 4.4 oz)  Physical Exam: Gen: Comfortably resting in bed CVS: Pulse regular in rate and rhythm, heart sounds S1 and S2 normal Resp: Clear to auscultation bilaterally, no rales/rhonchi Abd: Soft, obese, nontender without any organomegaly Ext: Trace lower extremity edema  Imaging: Dg Chest Port 1 View  01/27/2012  *RADIOLOGY REPORT*  Clinical Data: Assess edema  PORTABLE CHEST - 1 VIEW  Comparison: Portable chest x-ray of 01/26/2012  Findings: Cardiomegaly is stable.  The lungs remain well aerated. A defibrillator is unchanged in position and right central venous line remains.  The right paratracheal soft tissue mass is stable.  IMPRESSION: Stable chest x-ray.  No active lung disease.   Original Report Authenticated By: Dwyane Dee, M.D.    Dg Chest Port 1 View  01/26/2012  *RADIOLOGY REPORT*  Clinical Data: 51 year old male new right central line placement. Dialysis.  PORTABLE CHEST - 1 VIEW  Comparison: 01/25/2012 and earlier.  Findings: Semi upright AP portable view 1156 hours.  New right IJ dual lumen dialysis type catheter placed.  Catheter tip at the lower SVC level.  No pneumothorax.  Superimposed abnormal right paratracheal/superior mediastinal contour, seen to be chronic since 2008.  Stable cardiac size and mediastinal contours.  Stable left chest cardiac AICD.  Mildly improved ventilation at the lung bases and stable  ventilation elsewhere.  IMPRESSION: 1.  Right IJ dual lumen catheter placed.  No pneumothorax and catheter tip at the level of the lower SVC. 2.  Mildly improved basilar ventilation.  No acute cardiopulmonary abnormality with chronic right paratracheal mass.   Original Report Authenticated By: Erskine Speed, M.D.    Dg Chest Port 1 View  01/25/2012  *RADIOLOGY REPORT*  Clinical Data: Defibrillator fired multiple times today.  PORTABLE CHEST - 1 VIEW  Comparison: 07/03/2009 and 04/12/2008.  CT chest 04/03/2008.  Findings: Trachea is midline.  Heart is enlarged.  Pacemaker and AICD lead tips appear stable in position.  Right paratracheal mass appears larger than on 04/12/2008. Possible minimal right basilar atelectasis.  No pleural fluid.  IMPRESSION:  1.  Right paratracheal mass appears larger than on 04/12/2008, which may be due to AP semi upright technique.  PA and lateral views of the chest could be performed in further initial evaluation, as clinically indicated. 2.  Minimal right basilar atelectasis.   Original Report Authenticated By: Leanna Battles, M.D.     Labs: BMET  Lab 01/27/12 0520 01/26/12 1935 01/26/12 0755 01/26/12 0200 01/25/12 2202 01/25/12 1808 01/25/12 1753 01/25/12 1534  NA 139 135 136 135 132* -- 128* 130*  K 4.1 3.9 >7.5* >7.5* >7.5* -- >7.5* >7.5*  CL 100 98 110 109 107 -- 104 108  CO2 28 26 18* 17* 18* -- 18* 14*  GLUCOSE 112* 126* 118*  118* 98 -- 273* 95  BUN 35* 36* 70* 71* 70* -- 73* 73*  CREATININE 2.03* 1.84* 2.48* 2.54* 2.23* -- 2.38* 2.47*  ALB -- -- -- -- -- -- -- --  CALCIUM 7.9* 9.0 9.5 10.0 10.2 -- 9.7 9.7  PHOS -- -- 4.8* 4.6 -- 3.9 -- --   CBC  Lab 01/27/12 0520 01/26/12 0200 01/25/12 1808 01/25/12 1534  WBC 11.3* 13.0* 10.1 8.9  NEUTROABS 8.4* -- 7.9* --  HGB 15.0 16.9 16.6 16.0  HCT 44.1 47.5 48.5 48.1  MCV 88.9 88.0 90.7 90.9  PLT 148* 195 180 182    Medications:      . furosemide  40 mg Intravenous BID  . [COMPLETED] heparin      . heparin  subcutaneous  5,000 Units Subcutaneous Q8H  . pantoprazole (PROTONIX) IV  40 mg Intravenous Q24H     Assessment/ Plan:   1. 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 Good urine output noted overnight, we'll continue to monitor closely anticipating renal recovery. Benicar and Aldactone appropriately stopped. Discontinue isotonic sodium bicarbonate. 2. Hyperkalemia: critical. Dialyzed yesterday with successful correction of hyperkalemia after failed medical management. The etiology of his refractory hyperkalemia remains questioned-whether he had pathology beyond the acute renal failure/Aldactone-induced potassium retention.  3. Hypotension: Switch to normal saline from isotonic sodium bicarbonate. Switched to oral Lasix once a day. 4. Anion gap metabolic acidosis: Secondary to acute renal failure, now corrected status post hemodialysis/isotonic bicarbonate drip. 5. Fever/leukocytosis: Urinalysis, urine cultures, blood cultures x2-he reports recent upper respiratory tract symptoms.   Zetta Bills, MD 01/27/2012, 8:03 AM

## 2012-01-27 NOTE — Progress Notes (Signed)
RT Note: Pt states he wanted to try CPAP tonight. CPAP set up on AUTO titrate with full face mask per home use. Pt states he will place mask on when he is ready. RT will continue to monitor

## 2012-01-28 LAB — CBC
Hemoglobin: 14.4 g/dL (ref 13.0–17.0)
MCHC: 34 g/dL (ref 30.0–36.0)
Platelets: 138 10*3/uL — ABNORMAL LOW (ref 150–400)
RBC: 4.7 MIL/uL (ref 4.22–5.81)

## 2012-01-28 LAB — COMPREHENSIVE METABOLIC PANEL
ALT: 232 U/L — ABNORMAL HIGH (ref 0–53)
Alkaline Phosphatase: 75 U/L (ref 39–117)
BUN: 45 mg/dL — ABNORMAL HIGH (ref 6–23)
CO2: 29 mEq/L (ref 19–32)
Calcium: 8.4 mg/dL (ref 8.4–10.5)
GFR calc Af Amer: 34 mL/min — ABNORMAL LOW (ref >90)
GFR calc non Af Amer: 30 mL/min — ABNORMAL LOW (ref >90)
Glucose, Bld: 91 mg/dL (ref 70–99)
Potassium: 4.1 mEq/L (ref 3.5–5.1)
Total Protein: 6.8 g/dL (ref 6.0–8.3)

## 2012-01-28 LAB — URINE CULTURE
Colony Count: 9000
Special Requests: NORMAL

## 2012-01-28 LAB — CREATININE, SERUM
Creatinine, Ser: 2.15 mg/dL — ABNORMAL HIGH (ref 0.50–1.35)
GFR calc non Af Amer: 34 mL/min — ABNORMAL LOW (ref >90)

## 2012-01-28 LAB — URIC ACID: Uric Acid, Serum: 4.3 mg/dL (ref 4.0–7.8)

## 2012-01-28 MED ORDER — ONDANSETRON HCL 4 MG/2ML IJ SOLN: 4.0000 mg | Freq: Four times a day (QID) | INTRAMUSCULAR | Status: DC | PRN

## 2012-01-28 MED ORDER — SODIUM CHLORIDE 0.9 % IV SOLN: 250.0000 mL | INTRAVENOUS | Status: DC | PRN

## 2012-01-28 MED ORDER — HEPARIN SODIUM (PORCINE) 5000 UNIT/ML IJ SOLN
5000.0000 [IU] | Freq: Three times a day (TID) | INTRAMUSCULAR | Status: DC
  Administered 2012-01-28 – 2012-01-29 (×2): 5000 [IU] via SUBCUTANEOUS
  Filled 2012-01-28 (×5): qty 1

## 2012-01-28 MED ORDER — ISOSORBIDE DINITRATE 10 MG PO TABS
10.0000 mg | ORAL_TABLET | Freq: Three times a day (TID) | ORAL | Status: DC
  Administered 2012-01-28 – 2012-01-29 (×5): 10 mg via ORAL
  Filled 2012-01-28 (×7): qty 1

## 2012-01-28 MED ORDER — ACETAMINOPHEN 325 MG PO TABS: 650.0000 mg | ORAL_TABLET | ORAL | Status: DC | PRN

## 2012-01-28 MED ORDER — SODIUM CHLORIDE 0.9 % IJ SOLN
3.0000 mL | Freq: Two times a day (BID) | INTRAMUSCULAR | Status: DC
  Administered 2012-01-28 – 2012-01-29 (×2): 3 mL via INTRAVENOUS

## 2012-01-28 MED ORDER — HYDRALAZINE HCL 10 MG PO TABS
10.0000 mg | ORAL_TABLET | Freq: Three times a day (TID) | ORAL | Status: DC
  Administered 2012-01-28 – 2012-01-29 (×5): 10 mg via ORAL
  Filled 2012-01-28 (×7): qty 1

## 2012-01-28 MED ORDER — SODIUM CHLORIDE 0.9 % IJ SOLN: 3.0000 mL | INTRAMUSCULAR | Status: DC | PRN

## 2012-01-28 NOTE — Progress Notes (Addendum)
Patient ID: [name], male   DOB: 09-13-1960, 51 y.o.   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 LHC 2008: Normal cors   RHC 01/27/12 RA 1 PA 32/17 PCWP 6 Fick CI 2.2, Thermo CI 2.1  11/29/11 ECHO EF 15% Diffuse hypokinesis. Features are consistent with a pseudonormal left ventricular filling pattern, with concomitant abnormal relaxation and increased filling pressure (grade 2 diastolic dysfunction).   Initial visit in HF clinic 01/17/12 At that time volume status was ok and BIDIL was added 20/37.5 mg tid and CPX was ordered.   He presented presented to [facility] ED on 11/20 with firing of ICD 5 times. Admitted with hyperkalemia, acute on chronic renal failure & firing of AICD. Potassium > 7.5 Creatinine 2.54. , Magnesium 1.5 . Nephrology consulted and stopped Benicar and Aldactone. Also given Kayexalate without improvement in potassium. 01/26/12 required dialysis. Potassium improved and dialysis catheter was removed 01/27/12.   Potassium 7.5>3.9>4.1  Creatinine 2.54>2.48>1.84>2.03>2.39   Denies SOB/PND/Orthopnea.  Creatinine worse this morning.  Received last dose of Lasix yesterday.  SBP in the 100s-110s this morning.      Intake/Output Summary (Last 24 hours) at 01/28/12 0728 Last data filed at 01/28/12 0600  Gross per 24 hour  Intake   1080 ml  Output   1650 ml  Net   -570 ml    Current meds:    . [COMPLETED] aspirin  324 mg Oral Pre-Cath  . heparin subcutaneous  5,000 Units Subcutaneous Q8H  . [DISCONTINUED] furosemide  40 mg Intravenous BID  . [DISCONTINUED] furosemide  40 mg Oral Daily  . [DISCONTINUED] pantoprazole (PROTONIX) IV  40 mg Intravenous Q24H  . [DISCONTINUED] sodium chloride  3 mL Intravenous Q12H   Infusions:    . albuterol 10 mg/hr (01/25/12 1736)  .  [DISCONTINUED] sodium chloride    . [DISCONTINUED]  sodium bicarbonate infusion 1000 mL 75 mL/hr at 01/27/12 0400     Objective:  Blood pressure 90/59, pulse 89, temperature 97.8 F (36.6 C), temperature source Oral, resp. rate 20, height 5\' 10"  (1.778 m), weight 246 lb 14.6 oz (112 kg), SpO2 97.00%. Weight change: -7 lb 4.4 oz (-3.3 kg)  Physical Exam: General:  Well appearing. No resp difficulty HEENT: normal Neck: supple. JVP hard to tell but does not appear elevated. Carotids 2+ bilat; no bruits. No lymphadenopathy or thryomegaly appreciated. Cor: PMI nondisplaced. Regular rate & rhythm. No rubs, gallops or murmurs. Lungs: clear Abdomen: Obese, soft, nontender, nondistended. No hepatosplenomegaly. No bruits or masses. Good bowel sounds. Extremities: no cyanosis, clubbing, rash, edema Neuro: alert & orientedx3, cranial nerves grossly intact. moves all 4 extremities w/o difficulty. Affect pleasant  Telemetry: SR  Lab Results: Basic Metabolic Panel:  Lab 01/28/12 3086 01/27/12 0520 01/26/12 1935 01/26/12 0755 01/26/12 0200 01/25/12 1808  NA 137 139 135 136 135 --  K 4.1 4.1 -- -- -- --  CL 97 100 98 110 109 --  CO2 29 28 26  18* 17* --  GLUCOSE 91 112* 126* 118* 118* --  BUN 45* 35* 36* 70* 71* --  CREATININE 2.39* 2.03* 1.84* 2.48* 2.54* --  CALCIUM 8.4 7.9* 9.0 9.5 10.0 --  MG -- -- -- -- 1.5 1.5  PHOS -- -- -- 4.8* 4.6 3.9   Liver Function Tests:  Lab 01/28/12 0540  01/27/12 0520 01/26/12 1256 01/26/12 0755 01/25/12 1753  AST 111* 297* -- -- 51*  ALT 232* 338* 55* -- 34  ALKPHOS 75 75 -- -- 65  BILITOT 0.6 1.1 -- -- 0.5  PROT 6.8 6.4 -- -- 7.8  ALBUMIN 3.1* 3.0* -- 3.8 3.8   No results found for this basename: LIPASE:5,AMYLASE:5 in the last 168 hours No results found for this basename: AMMONIA:5 in the last 168 hours CBC:  Lab 01/27/12 0520 01/26/12 0200 01/25/12 1808 01/25/12 1534  WBC 11.3* 13.0* 10.1 8.9  NEUTROABS 8.4* -- 7.9* --  HGB 15.0 16.9 16.6 16.0    HCT 44.1 47.5 48.5 48.1  MCV 88.9 88.0 90.7 90.9  PLT 148* 195 180 182   Cardiac Enzymes: No results found for this basename: CKTOTAL:5,CKMB:5,CKMBINDEX:5,TROPONINI:5 in the last 168 hours BNP: No components found with this basename: POCBNP:5 CBG: No results found for this basename: GLUCAP:5 in the last 168 hours Microbiology: No results found for this basename: cult   No results found for this basename: CULT:2,SDES:2 in the last 168 hours  Imaging: Dg Chest Port 1 View  01/27/2012  *RADIOLOGY REPORT*  Clinical Data: Assess edema  PORTABLE CHEST - 1 VIEW  Comparison: Portable chest x-ray of 01/26/2012  Findings: Cardiomegaly is stable.  The lungs remain well aerated. A defibrillator is unchanged in position and right central venous line remains.  The right paratracheal soft tissue mass is stable.  IMPRESSION: Stable chest x-ray.  No active lung disease.   Original Report Authenticated By: Dwyane Dee, M.D.    Dg Chest Port 1 View  01/26/2012  *RADIOLOGY REPORT*  Clinical Data: 51 year old male new right central line placement. Dialysis.  PORTABLE CHEST - 1 VIEW  Comparison: 01/25/2012 and earlier.  Findings: Semi upright AP portable view 1156 hours.  New right IJ dual lumen dialysis type catheter placed.  Catheter tip at the lower SVC level.  No pneumothorax.  Superimposed abnormal right paratracheal/superior mediastinal contour, seen to be chronic since 2008.  Stable cardiac size and mediastinal contours.  Stable left chest cardiac AICD.  Mildly improved ventilation at the lung bases and stable ventilation elsewhere.  IMPRESSION: 1.  Right IJ dual lumen catheter placed.  No pneumothorax and catheter tip at the level of the lower SVC. 2.  Mildly improved basilar ventilation.  No acute cardiopulmonary abnormality with chronic right paratracheal mass.   Original Report Authenticated By: Erskine Speed, M.D.      ASSESSMENT:   1. ICD shocks due to oversensing T waves in setting of  hyperkalemia 2. Hyperkalemia 3. A/C systolic heart failure (EF 15%) 4. Acute/Chronic renal failure stage 3 - baseline 1.8-2.0 5. OSA- CPAP nightly 6. Gout 7. Obesity 8. Elevated LFTs: suspect shock liver from hypotension.   PLAN/DISCUSSION:  SBP in the 100s to 110s this morning.  Creatinine has increased a bit to 2.39.  RHC yesterday with low filling pressures.  Cardiac output was not significantly depressed.  LFTs improved.  - Hold Lasix today (last dose yesterday) and see what creatinine is tomorrow.  Will encourage po hydration today.  - Better BP, try to gently restart po meds => will add back hydralazine/isordil at 10 mg tid to see if he can tolerate.  - Hold ARB, spironolactone, digoxin.  - Can go to telemetry.   Deng Kemler,MD 7:28 AM

## 2012-01-28 NOTE — Progress Notes (Signed)
Patient ID: [name], male   DOB: 1961/01/24, 51 y.o.   MRN: [id number]   [facility] Progress Note    Subjective:   Underwent right heart catheterization yesterday and noted to have a good output   Objective:   BP 90/59  Pulse 89  Temp 97.8 F (36.6 C) (Oral)  Resp 20  Ht 5\' 10"  (1.778 m)  Wt 112 kg (246 lb 14.6 oz)  BMI 35.43 kg/m2  SpO2 97%  Intake/Output Summary (Last 24 hours) at 01/28/12 4098 Last data filed at 01/28/12 0600  Gross per 24 hour  Intake   1080 ml  Output   1650 ml  Net   -570 ml   Weight change: -3.3 kg (-7 lb 4.4 oz)  Physical Exam: JXB:[id number] resting in bed YQM:VHQIO RRR, normal S1 and S2  Resp:CTA bilaterally, no rales/rhonchi NGE:XBMW, obese, NT, BS normal Ext:1+ LE edema  Imaging: Dg Chest Port 1 View  01/27/2012  *RADIOLOGY REPORT*  Clinical Data: Assess edema  PORTABLE CHEST - 1 VIEW  Comparison: Portable chest x-ray of 01/26/2012  Findings: Cardiomegaly is stable.  The lungs remain well aerated. A defibrillator is unchanged in position and right central venous line remains.  The right paratracheal soft tissue mass is stable.  IMPRESSION: Stable chest x-ray.  No active lung disease.   Original Report Authenticated By: Dwyane Dee, M.D.    Dg Chest Port 1 View  01/26/2012  *RADIOLOGY REPORT*  Clinical Data: 51 year old male new right central line placement. Dialysis.  PORTABLE CHEST - 1 VIEW  Comparison: 01/25/2012 and earlier.  Findings: Semi upright AP portable view 1156 hours.  New right IJ dual lumen dialysis type catheter placed.  Catheter tip at the lower SVC level.  No pneumothorax.  Superimposed abnormal right paratracheal/superior mediastinal contour, seen to be chronic since 2008.  Stable cardiac size and mediastinal contours.  Stable left chest cardiac AICD.  Mildly improved ventilation at the lung bases and stable ventilation elsewhere.  IMPRESSION: 1.  Right IJ dual lumen catheter placed.  No pneumothorax and  catheter tip at the level of the lower SVC. 2.  Mildly improved basilar ventilation.  No acute cardiopulmonary abnormality with chronic right paratracheal mass.   Original Report Authenticated By: Erskine Speed, M.D.     Labs: BMET  Lab 01/28/12 0540 01/27/12 0520 01/26/12 1935 01/26/12 0755 01/26/12 0200 01/25/12 2202 01/25/12 1808 01/25/12 1753  NA 137 139 135 136 135 132* -- 128*  K 4.1 4.1 3.9 >7.5* >7.5* >7.5* -- >7.5*  CL 97 100 98 110 109 107 -- 104  CO2 29 28 26  18* 17* 18* -- 18*  GLUCOSE 91 112* 126* 118* 118* 98 -- 273*  BUN 45* 35* 36* 70* 71* 70* -- 73*  CREATININE 2.39* 2.03* 1.84* 2.48* 2.54* 2.23* -- 2.38*  ALB -- -- -- -- -- -- -- --  CALCIUM 8.4 7.9* 9.0 9.5 10.0 10.2 -- 9.7  PHOS -- -- -- 4.8* 4.6 -- 3.9 --   CBC  Lab 01/27/12 0520 01/26/12 0200 01/25/12 1808 01/25/12 1534  WBC 11.3* 13.0* 10.1 8.9  NEUTROABS 8.4* -- 7.9* --  HGB 15.0 16.9 16.6 16.0  HCT 44.1 47.5 48.5 48.1  MCV 88.9 88.0 90.7 90.9  PLT 148* 195 180 182    Medications:      . [COMPLETED] aspirin  324 mg Oral Pre-Cath  . heparin subcutaneous  5,000 Units Subcutaneous Q8H  . hydrALAZINE  10 mg Oral Q8H  . isosorbide  dinitrate  10 mg Oral Q8H  . [DISCONTINUED] furosemide  40 mg Oral Daily  . [DISCONTINUED] pantoprazole (PROTONIX) IV  40 mg Intravenous Q24H  . [DISCONTINUED] sodium chloride  3 mL Intravenous Q12H     Assessment/ Plan:   1. 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 Good urine output noted overnight and diuretics on hold, we'll continue to monitor closely anticipating renal recovery. Rising creatinine over the past 24hrs likely reflects rebound s/p HD. Benicar and Aldactone appropriately stopped.  2. Hyperkalemia: critical. Secondary to AKI on CKD with ongoing aldactone/benicar. Corrected with HD following failed medical management.   3. NICM: Lasix stopped and low dose hydralazine/isosorbide restarted.  4. Fever/leukocytosis: reports some arthralgias of the knees- check a uric acid level.   Zetta Bills, MD 01/28/2012, 8:24 AM

## 2012-01-29 ENCOUNTER — Encounter (HOSPITAL_COMMUNITY): Payer: Self-pay | Admitting: Cardiology

## 2012-01-29 LAB — RENAL FUNCTION PANEL
Albumin: 3.2 g/dL — ABNORMAL LOW (ref 3.5–5.2)
BUN: 47 mg/dL — ABNORMAL HIGH (ref 6–23)
Calcium: 8.6 mg/dL (ref 8.4–10.5)
Glucose, Bld: 91 mg/dL (ref 70–99)
Phosphorus: 3.9 mg/dL (ref 2.3–4.6)
Potassium: 4 mEq/L (ref 3.5–5.1)

## 2012-01-29 MED ORDER — MAGNESIUM HYDROXIDE 400 MG/5ML PO SUSP
30.0000 mL | Freq: Every day | ORAL | Status: DC | PRN
  Administered 2012-01-29: 30 mL via ORAL
  Filled 2012-01-29: qty 30

## 2012-01-29 MED ORDER — ALPRAZOLAM 0.25 MG PO TABS
0.2500 mg | ORAL_TABLET | Freq: Once | ORAL | Status: AC
  Administered 2012-01-29: 0.25 mg via ORAL
  Filled 2012-01-29: qty 1

## 2012-01-29 MED ORDER — COLCHICINE 0.6 MG PO TABS: 0.6000 mg | ORAL_TABLET | Freq: Two times a day (BID) | ORAL | Status: DC | PRN

## 2012-01-29 MED ORDER — ISOSORBIDE DINITRATE 10 MG PO TABS: 10.0000 mg | ORAL_TABLET | Freq: Three times a day (TID) | ORAL | Status: DC

## 2012-01-29 MED ORDER — OLMESARTAN MEDOXOMIL 20 MG PO TABS: 20.0000 mg | ORAL_TABLET | Freq: Every day | ORAL | Status: DC

## 2012-01-29 MED ORDER — HYDRALAZINE HCL 10 MG PO TABS: 10.0000 mg | ORAL_TABLET | Freq: Three times a day (TID) | ORAL | Status: DC

## 2012-01-29 MED ORDER — COLCHICINE 0.6 MG PO TABS
0.6000 mg | ORAL_TABLET | Freq: Two times a day (BID) | ORAL | Status: DC
  Administered 2012-01-29: 0.6 mg via ORAL
  Filled 2012-01-29 (×3): qty 1

## 2012-01-29 MED ORDER — CARVEDILOL 25 MG PO TABS: 12.5000 mg | ORAL_TABLET | Freq: Two times a day (BID) | ORAL | Status: DC

## 2012-01-29 MED ORDER — COLCHICINE 0.6 MG PO TABS: 0.6000 mg | ORAL_TABLET | Freq: Every day | ORAL | Status: DC

## 2012-01-29 NOTE — Progress Notes (Signed)
Patient ID: [name], male   DOB: 1960-09-20, 51 y.o.   MRN: [id number]   [facility] Progress Note   Subjective:   Reports to be feeling a gout flare coming on with arthralgias. Denies any chest pain or shortness of breath.    Objective:   BP 114/59  Pulse 82  Temp 98.1 F (36.7 C) (Oral)  Resp 18  Ht 5\' 10"  (1.778 m)  Wt 113.853 kg (251 lb)  BMI 36.01 kg/m2  SpO2 99%  Intake/Output Summary (Last 24 hours) at 01/29/12 8295 Last data filed at 01/29/12 0731  Gross per 24 hour  Intake   1290 ml  Output   1325 ml  Net    -35 ml   Weight change: 1.626 kg (3 lb 9.4 oz)  Physical Exam: Gen: Comfortably sitting up in bed, cleaning himself CVS: Pulse regular in rate and rhythm, heart sounds S1 and S2 normal Resp: Clear to auscultation bilaterally, no rales/rhonchi Abd: Soft, obese, nontender and bowel sounds are normal Ext: Trace lower extremity edema bilaterally  Imaging: No results found.  Labs: BMET  Lab 01/29/12 0643 01/28/12 1518 01/28/12 0540 01/27/12 0520 01/26/12 1935 01/26/12 0755 01/26/12 0200 01/25/12 2202 01/25/12 1808  NA 134* -- 137 139 135 136 135 132* --  K 4.0 -- 4.1 4.1 3.9 >7.5* >7.5* >7.5* --  CL 95* -- 97 100 98 110 109 107 --  CO2 28 -- 29 28 26  18* 17* 18* --  GLUCOSE 91 -- 91 112* 126* 118* 118* 98 --  BUN 47* -- 45* 35* 36* 70* 71* 70* --  CREATININE 1.98* 2.15* 2.39* 2.03* 1.84* 2.48* 2.54* -- --  ALB -- -- -- -- -- -- -- -- --  CALCIUM 8.6 -- 8.4 7.9* 9.0 9.5 10.0 10.2 --  PHOS 3.9 -- -- -- -- 4.8* 4.6 -- 3.9   CBC  Lab 01/28/12 1518 01/27/12 0520 01/26/12 0200 01/25/12 1808  WBC 9.0 11.3* 13.0* 10.1  NEUTROABS -- 8.4* -- 7.9*  HGB 14.4 15.0 16.9 16.6  HCT 42.4 44.1 47.5 48.5  MCV 90.2 88.9 88.0 90.7  PLT 138* 148* 195 180    Medications:      . heparin  5,000 Units Subcutaneous Q8H  . hydrALAZINE  10 mg Oral Q8H  . isosorbide dinitrate  10 mg Oral Q8H  . sodium chloride  3 mL Intravenous Q12H  .  [DISCONTINUED] heparin subcutaneous  5,000 Units Subcutaneous Q8H     Assessment/ Plan:   1. 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 Renal function now improving with preserved urine output. Would not restart Benicar until after discharge from the hospital and confirmation that renal function continues to improve. He needs ARB for CK D. of IgA nephropathy.  2. Hyperkalemia: Corrected. Secondary to AKI on CKD with ongoing aldactone/benicar. Corrected with HD following failed medical management.  3. NICM: Lasix stopped and low dose hydralazine/isosorbide restarted.  4. 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 Start colchicine.   Will sign off- please call for further assistance in management  Zetta Bills, MD 01/29/2012, 9:22 AM

## 2012-01-29 NOTE — Progress Notes (Signed)
Long discussion >30 min with pt regarding coping with multiple inappropriate shocks  Plan 1. Patient was given a magnet with instructions to apply following a first shock. He is to call 911 with any shocks and to keep the magnet in place as long as he is able to and alert and hold it in place. 2. Device was reprogrammed to decrease sensitivity from 0.3-0.6 mV. In addition the NID was increased 3. Most importantly, we understand the physiology whereby inappropriate shocks were delivered in the setting of hypokalemia and T wave oversensing. He will be monitored very closely for potassium levels with any medication adjustments. 4. Benzodiazepine for anxiolysis 5. Consider counseling support for PTSD.

## 2012-01-29 NOTE — Discharge Summary (Signed)
Discharge Summary   Patient ID: [name] MRN: [id number], DOB/AGE: 1960-06-13 51 y.o.  Primary MD: Jamie Hillier, MD Primary Cardiologist: Dr. Thomasene Lot. [name] Admit date: 01/25/2012 D/C date:     01/29/2012      Primary Discharge Diagnoses:  1. ICD shocks due to oversensing T waves in setting of hyperkalemia  2. Hyperkalemia due to overdiuresis and resultant acute renal failure 3. A/C systolic heart failure (EF 15%)  4. Acute/Chronic renal failure stage 3 - baseline 1.8-2.0  5. OSA - CPAP nightly  6. Gout  7. Elevated LFTs - suspect shock liver from hypotension.    Secondary Discharge Diagnoses:  . Cardiomyopathy     Idiopathic dilated; s/p Medtronic BiV ICD  . HTN (hypertension)     Allergies No Known Allergies  Diagnostic Studies/Procedures:  01/27/12 - Right Heart Cath  Findings:  RA = 1  RV = 28/2/4  PA = 32/17 (22)  PCW = 6  Fick cardiac output/index = 5.1/2.2  Thermo cardiac output/index = 5.0/2.1  PVR = 4.0 [place]  FA sat = 98%  PA sat = 64%, 68%  Assessment:  1. Hypovolemia  2. Normal cardiac ouptut  Plan/Discussion:  Filling pressures are low but cardiac output surprisingly normal. Will hold diuretics for a day and resume. Will need CPX testing at some point in near future to assess cardiac reserve.    History of Present Illness: 51 y.o. male w/ the above medical problems who presented to [facility] on 01/25/12 with multiple ICD shocks.  Hospital Course: In the ED he was hemodynamically stable and euvolemic on exam. EKG showed a V-paced rhythm. CXR showed an enlarging right paratracheal mass, but otherwise no acute findings. After obtaining repeat CXR it was noted the right paratracheal mass appeared larger due to positioning and was indeed stable compared to prior CXR. Labs were significant for Crt 2.38, K >7.5, Na 128. His hyperkalemia appeared to have been the result of over diuresis with resultant rise in creatinine and  decreased urine output. Lasix, Aldactone, Benicar, and Bidil were held. He was treated for hyperkalemia and admitted for further evaluation and treatment. He required IVF for hypotension and to assist in treatment of hyperkalemia. Due to persistent hyperkalemia a temporary dialysis catheter was placed and HD performed with successful correction of hyperkalemia. HD catheter was dc'd.   ICD interrogation revealed 6 shocks for what appeared to be VT/VF. Dr. Graciela [name] reviewed the interrogation and noted the shocks were inappropriate therapy for T wave oversensing. His device was reprogrammed for VT zone 146-207 and VF zone 207, the sensitivity was decreased from 0.3-0.6 mV, and the NID was increased. A magnet was placed over his device to prevent inappropriate shocks while hyperkalemia was being treated. The magnet was removed once potassium returned to the normal range and he had no further ICD shocks. [name] was given a magnet with instructions to apply following a first shock. He is to call 911 with any shocks and to keep the magnet in place as long as he is able to and alert and hold it in place.  He was resumed on oral lasix with resultant worsening of Crt so this was again held. It was suspected he had low output heart failure for which a right heart cath was performed revealing low filling pressures, but normal cardiac output. He remained euvolemic on exam and creatinine began to improve. Low dose Bidil was resumed with stable BPs. Lasix and low dose Coreg and ARB were  continued at discharge. He reported knee arthralgias that felt like a gout flare for which he was placed on colchicine. He was seen and evaluated by Dr. Gala [name] who felt he was stable for discharge home with plans for follow up as scheduled below.  Discharge Vitals: Blood pressure 114/59, pulse 82, temperature 98.1 F (36.7 C), temperature source Oral, resp. rate 18, height 5\' 10"  (1.778 m), weight 251 lb (113.853 kg), SpO2  99.00%.  Labs: Component Value Date   WBC 9.0 01/28/2012   HGB 14.4 01/28/2012   HCT 42.4 01/28/2012   MCV 90.2 01/28/2012   PLT 138* 01/28/2012    Lab 01/29/12 0643 01/28/12 0540  NA 134* --  K 4.0 --  CL 95* --  CO2 28 --  BUN 47* --  CREATININE 1.98* --  CALCIUM 8.6 --  PROT -- 6.8  BILITOT -- 0.6  ALKPHOS -- 75  ALT -- 232*  AST -- 111*  GLUCOSE 91 --     01/25/2012 18:17  Pro B Natriuretic peptide (BNP) 332.9 (H)     01/25/2012 15:34  Digoxin Level 1.0    Discharge Medications     Medication List     As of 01/29/2012  1:22 PM    STOP taking these medications         isosorbide-hydrALAZINE 20-37.5 MG per tablet   Commonly known as: BIDIL      spironolactone 25 MG tablet   Commonly known as: ALDACTONE      TAKE these medications         allopurinol 300 MG tablet   Commonly known as: ZYLOPRIM   Take 300 mg by mouth daily.      atorvastatin 40 MG tablet   Commonly known as: LIPITOR   Take 1 tablet (40 mg total) by mouth daily.      carvedilol 25 MG tablet   Commonly known as: COREG   Take 0.5 tablets (12.5 mg total) by mouth 2 (two) times daily with a meal.      colchicine 0.6 MG tablet   Take 1 tablet (0.6 mg total) by mouth 2 (two) times daily as needed (gout).      digoxin 0.25 MG tablet   Commonly known as: LANOXIN   Take 1/2 tablet by mouth once daily.      furosemide 40 MG tablet   Commonly known as: LASIX   Take 1 tablet (40 mg total) by mouth daily.      hydrALAZINE 10 MG tablet   Commonly known as: APRESOLINE   Take 1 tablet (10 mg total) by mouth every 8 (eight) hours.      isosorbide dinitrate 10 MG tablet   Commonly known as: ISORDIL   Take 1 tablet (10 mg total) by mouth every 8 (eight) hours.      olmesartan 20 MG tablet   Commonly known as: BENICAR   Take 1 tablet (20 mg total) by mouth daily.         Disposition   Discharge Orders      Future Orders Please Complete By Expires   Diet - low sodium heart  healthy      Increase activity slowly      Discharge instructions      Comments:   * KEEP GROIN SITE CLEAN AND DRY. Call the office for any signs of bleedings, pus, swelling, increased pain, or any other concerns. * NO HEAVY LIFTING (>10lbs) OR SEXUAL ACTIVITY X 5 DAYS. * NO DRIVING X 2-3  DAYS. * NO SOAKING BATHS, HOT TUBS, POOLS, ETC., X 5 DAYS.  * You were given a magnet in case of recurrent ICD shocks. Apply following a first shock then call 911 and keep the magnet in place as long as you are able to.  * Your CPX will be rescheduled for the next 1-2 weeks.     Follow-up Information    Follow up with Arvilla Meres, MD. (You will be called with appointment time)    Contact information:   [address] [place] [address] [phone]           Outstanding Labs/Studies:  1. BMET 2. CPX  Duration of Discharge Encounter: Greater than 30 minutes including physician and PA time.  Signed, HOPE, JESSICA PA-C 01/29/2012, 1:22 PM  Agree with d/c summary. Please see my rounding note on same day for full details.  Daniel Bensimhon,MD 11:57 AM

## 2012-01-29 NOTE — Progress Notes (Signed)
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 LHC 2008: Normal cors   RHC 01/27/12  RA 1  PA 32/17  PCWP 6  Fick CI 2.2, Thermo CI 2.1   11/29/11 ECHO EF 15% Diffuse hypokinesis. Features are consistent with a pseudonormal left ventricular filling pattern, with concomitant abnormal relaxation and increased filling pressure (grade 2 diastolic dysfunction).   Initial visit in HF clinic 01/17/12 At that time volume status was ok and BIDIL was added 20/37.5 mg tid and CPX was ordered.   He presented presented to [facility] ED on 11/20 with firing of ICD 5 times. Admitted with hyperkalemia, acute on chronic renal failure & firing of AICD. Potassium > 7.5 Creatinine 2.54. , Magnesium 1.5 . Nephrology consulted and stopped Benicar and Aldactone. Also given Kayexalate without improvement in potassium. 01/26/12 required dialysis. Potassium improved and dialysis catheter was removed 01/27/12.   Potassium 7.5>3.9>4.0 Creatinine 2.54>2.48>1.84>2.03>2.39>1.98  Feels anxious about his ICD firing. Left arm swollen from PIVs.  Ambulated yesterday without chest pain or sob. Denies SOB/PND/Orthopnea. Creatinine improved this morning. Received last dose of Lasix 11/22. I/Os (+). Weight increasing 246-->251lbs Hydralazine/isordil 10mg  tid added yesterday. SBP 80-110s overnight. Mild dizziness upon standing.   Objective   Telemetry: SR Paced 70-80s  Medications:   . colchicine  0.6 mg Oral BID   Followed by  . colchicine  0.6 mg Oral Daily  . heparin  5,000 Units Subcutaneous Q8H  . hydrALAZINE  10 mg Oral Q8H  . isosorbide dinitrate  10 mg Oral Q8H  . sodium chloride  3 mL Intravenous Q12H  . [DISCONTINUED] heparin subcutaneous  5,000  Units Subcutaneous Q8H   . albuterol 10 mg/hr (01/25/12 1736)    Physical Exam: Temp:  [97.3 F (36.3 C)-98.3 F (36.8 C)] 98.1 F (36.7 C) (11/24 0538) Pulse Rate:  [75-95] 82  (11/24 0538) Resp:  [16-18] 18  (11/24 0538) BP: (87-114)/(43-62) 114/59 mmHg (11/24 0538) SpO2:  [96 %-100 %] 99 % (11/24 0538) Weight:  [250 lb 8 oz (113.626 kg)-251 lb (113.853 kg)] 251 lb (113.853 kg) (11/24 0538)  General: Overweight anxious male, in no acute distress. Head: Normocephalic, atraumatic, sclera non-icteric, nares are without discharge.  Neck: Supple. Negative for carotid bruits or JVD Lungs: Clear bilaterally to auscultation without wheezes, rales, or rhonchi. Breathing is unlabored. Heart: Magnet on left chest. RRR S1 S2 without murmurs, rubs, or gallops.  Abdomen: Soft, non-tender, non-distended with normoactive bowel sounds. No rebound/guarding. No obvious abdominal masses. Msk:  Strength and tone appear normal for age. Extremities: Left hand/arm swollen. Trace bilat ankle edema. No clubbing or cyanosis. Distal pedal pulses are intact and equal bilaterally. Neuro: Alert and oriented X 3. Moves all extremities spontaneously. Psych:  Responds to questions appropriately with a anxious affect.   Intake/Output Summary (Last 24 hours) at 01/29/12 0949 Last data filed at 01/29/12 0731  Gross per 24 hour  Intake   1290 ml  Output   1325 ml  Net    -35 ml    Labs: Basename 01/29/12 0643 01/28/12 1518 01/28/12 0540  NA 134* -- 137  K 4.0 -- 4.1  CL 95* -- 97  CO2 28 -- 29  GLUCOSE 91 -- 91  BUN 47* -- 45*  CREATININE 1.98* 2.15* --  CALCIUM 8.6 -- 8.4  PHOS 3.9 -- --   Basename 01/29/12 0643 01/28/12 0540 01/27/12 0520  AST -- 111* 297*  ALT -- 232* 338*  ALKPHOS -- 75 75  BILITOT -- 0.6 1.1  PROT -- 6.8 6.4  ALBUMIN 3.2* 3.1* --   Basename 01/28/12 1518 01/27/12 0520  WBC 9.0 11.3*  NEUTROABS -- 8.4*  HGB 14.4 15.0  HCT 42.4 44.1  MCV 90.2 88.9  PLT 138* 148*    Radiology/Studies:   01/27/2012  - PORTABLE CHEST - 1 VIEW   Findings: Cardiomegaly is stable.  The lungs remain well aerated. A defibrillator is unchanged in position and right central venous line remains.  The right paratracheal soft tissue mass is stable.  IMPRESSION: Stable chest x-ray.  No active lung disease.     01/27/12 - Right Heart Cath Findings:  RA = 1  RV = 28/2/4  PA = 32/17 (22)  PCW = 6  Fick cardiac output/index = 5.1/2.2  Thermo cardiac output/index = 5.0/2.1  PVR = 4.0 Woods  FA sat = 98%  PA sat = 64%, 68%  Assessment:  1. Hypovolemia  2. Normal cardiac ouptut  Plan/Discussion:  Filling pressures are low but cardiac output surprisingly normal. Will hold diuretics for a day and resume. Will need CPX testing at some point in near future to assess cardiac reserve.     Assessment and Plan  51 y/o AAM with PMH of HTN, Idiopathic dilated cardiomyopathy with BiV ICD who presented to [facility] ED on 11/20 with firing of pacemaker. Admitted with hyperkalemia, acute on chronic renal failure & firing of AICD.   1. ICD shocks due to oversensing T waves in setting of hyperkalemia  2. Hyperkalemia  3. A/C systolic heart failure (EF 15%)  4. Acute/Chronic renal failure stage 3 - baseline 1.8-2.0  5. OSA- CPAP nightly  6. Gout  7. Obesity  8. Elevated LFTs: suspect shock liver from hypotension.   RHC 11/22 showed low filling pressures, but normal CO. Diuretics on hold since 11/22. Euvolemic on exam, but weight increasing (246-->251lbs). Crt improving. MD advise on resumption of diuretics. Coreg and Dig on hold. Hydralazine/Isordil 10mg  tid started yesterday. SBP 80-110s. Mild dizziness upon standing, but ok with ambulation. Remove Magnet from ICD today. Potassium normalized. Aldactone and Benicar on hold. Per Renal needs ARB for CKD of IgA nephropathy, but would not restart Benicar until after dc and when renal function shows continued improvement. Colchicine initiated by renal  this morning for suspected gouty arthritis flare. LFTs improving. Statin on hold. Left hand/arm swelling likely due to PIV infiltration. Place warm compresses. MD advise on need for any further evaluation/treatment.  Disposition: Patient nervous about removing magnet from ICD. Would like to be monitored on telemetry "for a while". MD to discuss timing of DC with patient.   Signed, HOPE, JESSICA PA-C  Patient seen and examined with [facility], PA-C. We discussed all aspects of the encounter. I agree with the assessment and plan as stated above.   HF well tuned. Hyperkalemia resolved. No further events on tele. Discussed fact that ICD shocks were due to peaked T waves from hyperkalemia and that is resolved. We will give him magnet to use in case of recurrent shocks. Dr. Graciela Husbands will discuss with him as well.   Hyperkalemia likely due to spiro. That is d/c'd. 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  Is scheduled for CPX test tomorrow but can reschedule for 1-2 weeks.   Ok for d/c today. F/u in HF clinic this week with BMET.  Valencia Kassa,MD 11:26 AM

## 2012-01-30 ENCOUNTER — Encounter (HOSPITAL_COMMUNITY): Payer: 59

## 2012-01-31 ENCOUNTER — Encounter: Payer: Self-pay | Admitting: *Deleted

## 2012-01-31 ENCOUNTER — Ambulatory Visit (HOSPITAL_COMMUNITY)
Admission: RE | Admit: 2012-01-31 | Discharge: 2012-01-31 | Disposition: A | Discharge status: Home / Self Care | Payer: 59 | Source: Ambulatory Visit | Attending: Internal Medicine | Admitting: Internal Medicine

## 2012-01-31 ENCOUNTER — Telehealth (HOSPITAL_COMMUNITY): Payer: Self-pay | Admitting: Adult Health

## 2012-01-31 ENCOUNTER — Telehealth: Payer: Self-pay | Admitting: Internal Medicine

## 2012-01-31 ENCOUNTER — Encounter (HOSPITAL_COMMUNITY): Payer: Self-pay

## 2012-01-31 VITALS — BP 92/66 | HR 89 | Wt 253.8 lb

## 2012-01-31 DIAGNOSIS — I5022 Chronic systolic (congestive) heart failure: Secondary | ICD-10-CM | POA: Insufficient documentation

## 2012-01-31 DIAGNOSIS — R0609 Other forms of dyspnea: Secondary | ICD-10-CM | POA: Insufficient documentation

## 2012-01-31 DIAGNOSIS — R0600 Dyspnea, unspecified: Secondary | ICD-10-CM

## 2012-01-31 DIAGNOSIS — R0989 Other specified symptoms and signs involving the circulatory and respiratory systems: Secondary | ICD-10-CM | POA: Insufficient documentation

## 2012-01-31 DIAGNOSIS — E875 Hyperkalemia: Secondary | ICD-10-CM | POA: Insufficient documentation

## 2012-01-31 LAB — BASIC METABOLIC PANEL
BUN: 44 mg/dL — ABNORMAL HIGH (ref 6–23)
CO2: 26 mEq/L (ref 19–32)
Calcium: 9.4 mg/dL (ref 8.4–10.5)
Creatinine, Ser: 1.66 mg/dL — ABNORMAL HIGH (ref 0.50–1.35)
GFR calc non Af Amer: 46 mL/min — ABNORMAL LOW (ref >90)
Glucose, Bld: 89 mg/dL (ref 70–99)
Sodium: 132 mEq/L — ABNORMAL LOW (ref 135–145)

## 2012-01-31 MED ORDER — FUROSEMIDE 40 MG PO TABS: ORAL_TABLET | ORAL | Status: DC

## 2012-01-31 NOTE — Telephone Encounter (Signed)
Pt provided with return to work letter.

## 2012-01-31 NOTE — Assessment & Plan Note (Signed)
NYHA II. Volume status stable. Weight ok. He is agreeable to taking Lasix 40 mg on Monday and Friday. BP soft will cut back Benicar to 20 mg daily.  Reinforced daily weights and medication compliance. Check BMET today. Follow up in 3 weeks. Will try to titrate beta blocker at that time.

## 2012-01-31 NOTE — Patient Instructions (Addendum)
Follow up in 3 weeks with Dr Gala Romney  Take Lasix 40 mg Monday and Friday  Do the following things EVERYDAY: 1) Weigh yourself in the morning before breakfast. Write it down and keep it in a log. 2) Take your medicines as prescribed 3) Eat low salt foods-Limit salt (sodium) to 2000 mg per day.  4) Stay as active as you can everyday 5) Limit all fluids for the day to less than 2 liters

## 2012-01-31 NOTE — Telephone Encounter (Signed)
Provided lab results form today. Potassium ok and creatinine continues to trend down.   Continue current plan. Take Lasix 40 mg Monday and Friday. Also cut back Benicar to 20 mg daily.   Mr Pickerill appreciated phone and verbalized understanding.

## 2012-01-31 NOTE — Assessment & Plan Note (Signed)
Repeat BMET today. Cut back Benicar to 20 mg daily.

## 2012-01-31 NOTE — Telephone Encounter (Signed)
plz return call to pt at hm# regarding return to work document as patient works from home.

## 2012-01-31 NOTE — Progress Notes (Signed)
Patient ID: [name], male   DOB: 07-Feb-1961, 52 y.o.   MRN: [id number] PCP: Dr Clarene Duke Nephrologist: Dr [name] Cardiologist: Dr Graciela Husbands Urologist:   HPI:  [name] is a 51 year old with a PMH of gout, nonischemic cardiomyopathy (EF 15%) S/P CRT-D implantation 2010, VT with amiodarone which was discontinued because of l;ightheadedness, prostatitis, and OSA (CPAP).  He was referred by Dr. Graciela Husbands for enrollment into HF Clinic  He was seen in jan 2103 with flurries of VT.  Cath 2008: Normal cors  RHC 04/2008  RA pressure is 6  RV pressure is 35/5, PCWP 7 PAP 39/7 with a mean pressure of 23 CO/CI 4.6/ 2.04 L per minute. Fick CO 4.74 L per minute  CI 2.08.   RHC 01/28/12  RA = 1  RV = 28/2/4  PA = 32/17 (22)  PCW = 6  Fick cardiac output/index = 5.1/2.2  Thermo cardiac output/index = 5.0/2.1  PVR = 4.0 Woods  FA sat = 98%  PA sat = 64%, 68%  11/29/11 ECHO EF 15% Diffuse hypokinesis. Features are consistent with a pseudonormal left ventricular filling pattern, with concomitant abnormal relaxation and increased filling pressure (grade 2 diastolic dysfunction).  Admitted to [facility] after his ICD fired 5 times  Potassium >7.5  Required urgent dialysis. Discharged form [facility] 01/29/12. Discharge weight 250 pounds. Spironolactone stopped, Benicar cut back to 20 mg daily, Carvedilol cut back 12.5 mg twice day, hydralazine cut back to 10 mg tid, and Isordil was started at 10 mg daily. Discharge Potassium 4.0 Creatinine 1.98.   He returns for post hospital follow up. He is anxious about starting lasix and worried his potassium will go up.   Denies SOB/PND/Orthopnea/dizziness. Compliant with medications but confused about d/c  Medications and continued on Benicar at 40 daily instead of 20 mg daily. Marland KitchenNo Shocks.  Lives with his wife and son.     Review of Systems:     Cardiac Review of Systems: {Y] = yes [ ]  = no  Chest Pain [    ]  Resting SOB [   ] Exertional SOB  [Y  ]  Orthopnea [  ]   Pedal Edema [   ]    Palpitations [  ] Syncope  [  ]   Presyncope [   ]  General Review of Systems: [Y] = yes [  ]=no Constitional: recent weight change [  ]; anorexia [  ]; fatigue [ Y ]; nausea [  ]; night sweats [  ]; fever [  ]; or chills [  ];                                                                                                                                          Dental: poor dentition[  ]; Last Dentist visit:  Eye : blurred vision [  ]; diplopia [   ];  vision changes [  ];  Amaurosis fugax[  ]; Resp: cough [  ];  wheezing[  ];  hemoptysis[  ]; shortness of breath[  ]; paroxysmal nocturnal dyspnea[  ]; dyspnea on exertion[  ]; or orthopnea[  ];  GI:  gallstones[  ], vomiting[  ];  dysphagia[  ]; melena[  ];  hematochezia [  ]; heartburn[  ];   Hx of  Colonoscopy[  ]; GU: kidney stones [  ]; hematuria[  ];   dysuria [  ];  nocturia[  ];  history of     obstruction [  ];                 Skin: rash, swelling[  ];, hair loss[  ];  peripheral edema[  ];  or itching[  ]; Musculosketetal: myalgias[  ];  joint swelling[  ];  joint erythema[  ];  joint pain[  Y];  back pain[  ];  Heme/Lymph: bruising[  ];  bleeding[  ];  anemia[  ];  Neuro: TIA[  ];  headaches[Y  ];  stroke[  ];  vertigo[  ];  seizures[  ];   paresthesias[  ];  difficulty walking[  ];  Psych:depression[  ]; anxiety[  ];  Endocrine: diabetes[  ];  thyroid dysfunction[  ];  Immunizations: Flu [  ]; Pneumococcal[  ];  Other:    Past Medical History  Diagnosis Date  . Gout   . Cardiomyopathy     Idiopathic dilated; s/p Medtronic BiV ICD  . Ventricular tachycardia     s/p ICD  . HTN (hypertension)   . Sleep apnea     CPAP  . Systolic CHF     EF 15%  . CKD (chronic kidney disease)     stage III baseline Crt 1.8-2.0    Current Outpatient Prescriptions  Medication Sig Dispense Refill  . allopurinol (ZYLOPRIM) 300 MG tablet Take 300 mg by mouth daily.      Marland Kitchen atorvastatin (LIPITOR) 40 MG tablet Take 1 tablet  (40 mg total) by mouth daily.  90 tablet  1  . carvedilol (COREG) 25 MG tablet Take 0.5 tablets (12.5 mg total) by mouth 2 (two) times daily with a meal.  30 tablet  3  . colchicine 0.6 MG tablet Take 1 tablet (0.6 mg total) by mouth 2 (two) times daily as needed (gout).  10 tablet  0  . digoxin (LANOXIN) 0.25 MG tablet Take 1/2 tablet by mouth once daily.      . furosemide (LASIX) 40 MG tablet Take 1 tablet (40 mg total) by mouth daily.  30 tablet  11  . hydrALAZINE (APRESOLINE) 10 MG tablet Take 1 tablet (10 mg total) by mouth every 8 (eight) hours.  90 tablet  3  . isosorbide dinitrate (ISORDIL) 10 MG tablet Take 1 tablet (10 mg total) by mouth every 8 (eight) hours.  90 tablet  3  . olmesartan (BENICAR) 20 MG tablet Take 1 tablet (20 mg total) by mouth daily.  30 tablet  3     No Known Allergies  History   Social History  . Marital Status: Married    Spouse Name: N/A    Number of Children: N/A  . Years of Education: N/A   Occupational History  . Not on file.   Social History Main Topics  . Smoking status: Never Smoker   . Smokeless tobacco: Not on file  . Alcohol Use:   . Drug Use:   .  Sexually Active:    Other Topics Concern  . Not on file   Social History Narrative  . No narrative on file    No family history on file.  PHYSICAL EXAM: Filed Vitals:   01/31/12 1148  BP: 92/66  Pulse: 89   General:  Well appearing. No respiratory difficulty HEENT: normal Neck: supple. no JVD. Carotids 2+ bilat; no bruits. No lymphadenopathy or thryomegaly appreciated. Cor: PMI nondisplaced. Regular rate & rhythm. No rubs, gallops or murmurs. Lungs: clear Abdomen: soft, nontender, nondistended. No hepatosplenomegaly. No bruits or masses. Good bowel sounds. Extremities: no cyanosis, clubbing, rash, edema Neuro: alert & oriented x 3, cranial nerves grossly intact. moves all 4 extremities w/o difficulty. Affect pleasant.    ASSESSMENT & PLAN:

## 2012-02-01 NOTE — H&P (Signed)
Patient seen and examined, agree with above note.  I dictated the care and orders written for this patient under my direction.  Meagan Spease, M.D. [phone] 

## 2012-02-13 ENCOUNTER — Encounter (HOSPITAL_COMMUNITY): Payer: 59

## 2012-02-16 ENCOUNTER — Encounter (HOSPITAL_COMMUNITY): Payer: 59

## 2012-02-21 ENCOUNTER — Ambulatory Visit (HOSPITAL_COMMUNITY)
Admission: RE | Admit: 2012-02-21 | Discharge: 2012-02-21 | Disposition: A | Discharge status: Home / Self Care | Payer: 59 | Source: Ambulatory Visit | Attending: Internal Medicine | Admitting: Internal Medicine

## 2012-02-21 VITALS — BP 132/80 | HR 82 | Wt 252.5 lb

## 2012-02-21 DIAGNOSIS — N179 Acute kidney failure, unspecified: Secondary | ICD-10-CM

## 2012-02-21 DIAGNOSIS — I10 Essential (primary) hypertension: Secondary | ICD-10-CM

## 2012-02-21 DIAGNOSIS — E875 Hyperkalemia: Secondary | ICD-10-CM

## 2012-02-21 DIAGNOSIS — I5022 Chronic systolic (congestive) heart failure: Secondary | ICD-10-CM

## 2012-02-21 LAB — BASIC METABOLIC PANEL
BUN: 18 mg/dL (ref 6–23)
Calcium: 9.6 mg/dL (ref 8.4–10.5)
Creatinine, Ser: 1.35 mg/dL (ref 0.50–1.35)
GFR calc Af Amer: 68 mL/min — ABNORMAL LOW (ref >90)
GFR calc non Af Amer: 59 mL/min — ABNORMAL LOW (ref >90)
Glucose, Bld: 103 mg/dL — ABNORMAL HIGH (ref 70–99)
Potassium: 4.5 mEq/L (ref 3.5–5.1)

## 2012-02-21 NOTE — Progress Notes (Signed)
PCP: Dr Clarene Duke Nephrologist: Dr [name] Cardiologist: Dr Graciela Husbands Urologist:   HPI:  [name] is a 51 year old with a PMH of gout, nonischemic cardiomyopathy (EF 15%) S/P CRT-D implantation 2010, VT with amiodarone which was discontinued because of l;ightheadedness, prostatitis, and OSA (CPAP).  He was referred by Dr. Graciela Husbands for enrollment into HF Clinic  He was seen in jan 2103 with flurries of VT.  Cath 2008: Normal cors  RHC 04/2008  RA pressure is 6  RV pressure is 35/5, PCWP 7 PAP 39/7 with a mean pressure of 23 CO/CI 4.6/ 2.04 L per minute. Fick CO 4.74 L per minute  CI 2.08.   RHC 01/28/12  RA = 1  RV = 28/2/4  PA = 32/17 (22)  PCW = 6  Fick cardiac output/index = 5.1/2.2  Thermo cardiac output/index = 5.0/2.1  PVR = 4.0 Woods  FA sat = 98%  PA sat = 64%, 68%  11/29/11 ECHO EF 15% Diffuse hypokinesis. Features are consistent with a pseudonormal left ventricular filling pattern, with concomitant abnormal relaxation and increased filling pressure (grade 2 diastolic dysfunction).  Admitted to [facility] after his ICD fired 5 times  Potassium >7.5  Required urgent dialysis. Discharged form [facility] 01/29/12. Discharge weight 250 pounds. Spironolactone stopped, Benicar cut back to 20 mg daily, Carvedilol cut back 12.5 mg twice day, hydralazine cut back to 10 mg tid, and Isordil was started at 10 mg daily. Discharge Potassium 4.0 Creatinine 1.98.    Lives with his wife and son.    He returns for follow up today.  He complains of a terrible HA with associated dizziness.  He has had this feeling in the past when he was on Bidil.  He is complaint with meds.    Weight stable.  No dyspnea, orthopnea, or PND.       Review of Systems: All pertinent positives and negatives as in HPI, otherwise negative.     Past Medical History  Diagnosis Date  . Gout   . Cardiomyopathy     Idiopathic dilated; s/p Medtronic BiV ICD  . Ventricular tachycardia     s/p ICD  . HTN (hypertension)   .  Sleep apnea     CPAP  . Systolic CHF     EF 15%  . CKD (chronic kidney disease)     stage III baseline Crt 1.8-2.0    Current Outpatient Prescriptions  Medication Sig Dispense Refill  . allopurinol (ZYLOPRIM) 300 MG tablet Take 300 mg by mouth daily.      Marland Kitchen atorvastatin (LIPITOR) 40 MG tablet Take 1 tablet (40 mg total) by mouth daily.  90 tablet  1  . carvedilol (COREG) 25 MG tablet Take 0.5 tablets (12.5 mg total) by mouth 2 (two) times daily with a meal.  30 tablet  3  . colchicine 0.6 MG tablet Take 1 tablet (0.6 mg total) by mouth 2 (two) times daily as needed (gout).  10 tablet  0  . digoxin (LANOXIN) 0.25 MG tablet Take 1/2 tablet by mouth once daily.      . furosemide (LASIX) 40 MG tablet Take 1 tablet Monday and Friday  30 tablet  11  . hydrALAZINE (APRESOLINE) 10 MG tablet Take 1 tablet (10 mg total) by mouth every 8 (eight) hours.  90 tablet  3  . isosorbide dinitrate (ISORDIL) 10 MG tablet Take 1 tablet (10 mg total) by mouth every 8 (eight) hours.  90 tablet  3  . olmesartan (BENICAR) 20 MG  tablet Take 1 tablet (20 mg total) by mouth daily.  30 tablet  3     No Known Allergies  History   Social History  . Marital Status: Married    Spouse Name: N/A    Number of Children: N/A  . Years of Education: N/A   Occupational History  . Not on file.   Social History Main Topics  . Smoking status: Never Smoker   . Smokeless tobacco: Not on file  . Alcohol Use:   . Drug Use:   . Sexually Active:    Other Topics Concern  . Not on file   Social History Narrative  . No narrative on file    No family history on file.  PHYSICAL EXAM: Filed Vitals:   02/21/12 1020  BP: 132/80  Pulse: 82  Weight: 252 lb 8 oz (114.533 kg)  SpO2: 98%    General:  Well appearing. No respiratory difficulty HEENT: normal Neck: supple. JVP 7-8. Carotids 2+ bilat; no bruits. No lymphadenopathy or thryomegaly appreciated. Cor: PMI nondisplaced. Regular rate & rhythm. No rubs, gallops  or murmurs. Lungs: clear Abdomen: soft, nontender, nondistended. No hepatosplenomegaly. No bruits or masses. Good bowel sounds. Extremities: no cyanosis, clubbing, rash, edema Neuro: alert & oriented x 3, cranial nerves grossly intact. moves all 4 extremities w/o difficulty. Affect pleasant.    ASSESSMENT & PLAN:

## 2012-02-21 NOTE — Patient Instructions (Addendum)
Stop isordil.  Follow up after CPX.

## 2012-02-23 NOTE — Assessment & Plan Note (Signed)
Recheck BMET today to follow Cr.

## 2012-02-23 NOTE — Assessment & Plan Note (Addendum)
NYHA II.  Volume status mildly elevated.  He will take extra lasix today and continue twice weekly.  Re-educated on daily weights and sliding scale lasix.  With second attempt at isosorbide with side effects of HA will discontinue at this time.  He will call HA do not resolve.  Will proceed with CPX to assess functional limitations.

## 2012-02-23 NOTE — Assessment & Plan Note (Signed)
Controlled.  As above stop imdur due to HA.

## 2012-03-01 ENCOUNTER — Ambulatory Visit (HOSPITAL_COMMUNITY): Payer: 59 | Attending: Internal Medicine

## 2012-03-01 DIAGNOSIS — I5022 Chronic systolic (congestive) heart failure: Secondary | ICD-10-CM | POA: Insufficient documentation

## 2012-03-07 ENCOUNTER — Encounter: Payer: Self-pay | Admitting: Internal Medicine

## 2012-03-08 ENCOUNTER — Telehealth: Payer: Self-pay | Admitting: Internal Medicine

## 2012-03-08 ENCOUNTER — Ambulatory Visit (INDEPENDENT_AMBULATORY_CARE_PROVIDER_SITE_OTHER): Payer: 59 | Admitting: *Deleted

## 2012-03-08 DIAGNOSIS — I5022 Chronic systolic (congestive) heart failure: Secondary | ICD-10-CM

## 2012-03-08 DIAGNOSIS — I428 Other cardiomyopathies: Secondary | ICD-10-CM

## 2012-03-08 DIAGNOSIS — Z95810 Presence of automatic (implantable) cardiac defibrillator: Secondary | ICD-10-CM

## 2012-03-08 NOTE — Telephone Encounter (Signed)
New Problem:    Patient called in because he was sweaty and clammy on 12/31 and 03/07/12, sent in a transmission on 03/07/12 and wanted someone to take a look at it to see if everything was ok.  Please call back.

## 2012-03-08 NOTE — Telephone Encounter (Signed)
No episodes on transmission. Spoke w/pt and thinks it has to do with change in medication. Will call back if continues to be a problem. Offered to make appt for pt and declined at this time.

## 2012-03-09 LAB — REMOTE ICD DEVICE
AL IMPEDENCE ICD: 532 Ohm
ATRIAL PACING ICD: 0.04 pct
BATTERY VOLTAGE: 2.6224 V
BRDY-0003LV: 130 {beats}/min
BRDY-0004LV: 120 {beats}/min
LV LEAD IMPEDENCE ICD: 741 Ohm
LV LEAD THRESHOLD: 0.5 V
PACEART VT: 0
TOT-0002: 0
TOT-0006: 20100205000000
TZAT-0001ATACH: 1
TZAT-0001FASTVT: 1
TZAT-0001FASTVT: 2
TZAT-0001SLOWVT: 1
TZAT-0001SLOWVT: 2
TZAT-0002ATACH: NEGATIVE
TZAT-0002ATACH: NEGATIVE
TZAT-0004SLOWVT: 8
TZAT-0004SLOWVT: 8
TZAT-0005SLOWVT: 88 pct
TZAT-0005SLOWVT: 91 pct
TZAT-0013FASTVT: 2
TZAT-0013FASTVT: 2
TZAT-0013SLOWVT: 3
TZAT-0013SLOWVT: 3
TZAT-0018ATACH: NEGATIVE
TZAT-0018FASTVT: NEGATIVE
TZAT-0018FASTVT: NEGATIVE
TZAT-0019ATACH: 6 V
TZAT-0019ATACH: 6 V
TZAT-0019FASTVT: 8 V
TZAT-0020ATACH: 1.5 ms
TZAT-0020FASTVT: 1.5 ms
TZAT-0020FASTVT: 1.5 ms
TZON-0003FASTVT: 300 ms
TZON-0004SLOWVT: 40
TZST-0001ATACH: 5
TZST-0001FASTVT: 3
TZST-0001FASTVT: 5
TZST-0001FASTVT: 6
TZST-0001SLOWVT: 3
TZST-0001SLOWVT: 5
TZST-0002ATACH: NEGATIVE
TZST-0002SLOWVT: NEGATIVE
TZST-0002SLOWVT: NEGATIVE
TZST-0003FASTVT: 35 J
VENTRICULAR PACING ICD: 99.6 pct

## 2012-03-26 ENCOUNTER — Encounter (HOSPITAL_COMMUNITY): Payer: Self-pay

## 2012-03-26 ENCOUNTER — Ambulatory Visit (HOSPITAL_COMMUNITY)
Admission: RE | Admit: 2012-03-26 | Discharge: 2012-03-26 | Disposition: A | Discharge status: Home / Self Care | Payer: 59 | Source: Ambulatory Visit | Attending: Internal Medicine | Admitting: Internal Medicine

## 2012-03-26 VITALS — BP 136/92 | HR 107 | Wt 258.0 lb

## 2012-03-26 DIAGNOSIS — R0989 Other specified symptoms and signs involving the circulatory and respiratory systems: Secondary | ICD-10-CM | POA: Insufficient documentation

## 2012-03-26 DIAGNOSIS — R0600 Dyspnea, unspecified: Secondary | ICD-10-CM

## 2012-03-26 DIAGNOSIS — R0609 Other forms of dyspnea: Secondary | ICD-10-CM | POA: Insufficient documentation

## 2012-03-26 DIAGNOSIS — I5022 Chronic systolic (congestive) heart failure: Secondary | ICD-10-CM | POA: Insufficient documentation

## 2012-03-26 LAB — BASIC METABOLIC PANEL
CO2: 20 mEq/L (ref 19–32)
Calcium: 9.8 mg/dL (ref 8.4–10.5)
Creatinine, Ser: 1.15 mg/dL (ref 0.50–1.35)
Glucose, Bld: 118 mg/dL — ABNORMAL HIGH (ref 70–99)
Sodium: 137 mEq/L (ref 135–145)

## 2012-03-26 MED ORDER — CARVEDILOL 12.5 MG PO TABS: ORAL_TABLET | ORAL | Status: DC

## 2012-03-26 MED ORDER — FUROSEMIDE 40 MG PO TABS: ORAL_TABLET | ORAL | Status: DC

## 2012-03-26 NOTE — Patient Instructions (Addendum)
Change Coreg to 18.75 mg (1 and 1/2 tab) twice a day.  Stop Hydralazine.  Labs today.  Increase Lasix to 3 times per week  Your physician wants you to follow-up in: 4 weeks

## 2012-03-26 NOTE — Progress Notes (Signed)
PCP: Dr Clarene Duke Nephrologist: Dr [name] Cardiologist: Dr Graciela Husbands Urologist:   HPI:  [name] is a 52 year old with a PMH of gout, nonischemic cardiomyopathy (EF 15%) S/P CRT-D implantation 2010, VT with amiodarone which was discontinued because of l;ightheadedness, prostatitis, and OSA (CPAP).  He was referred by Dr. Graciela Husbands for enrollment into HF Clinic  He was seen in jan 2103 with flurries of VT.  Cath 2008: Normal cors  RHC 01/28/12  RA = 1  RV = 28/2/4  PA = 32/17 (22)  PCW = 6  Fick cardiac output/index = 5.1/2.2  Thermo cardiac output/index = 5.0/2.1  PVR = 4.0 Woods  FA sat = 98%  PA sat = 64%, 68%  11/29/11 ECHO EF 15% Diffuse hypokinesis. Features are consistent with a pseudonormal left ventricular filling pattern, with concomitant abnormal relaxation and increased filling pressure (grade 2 diastolic dysfunction).  Admitted to [facility] after his ICD fired 5 times  Potassium >7.5  Required urgent dialysis. Discharged form [facility] 01/29/12. Discharge weight 250 pounds. Spironolactone stopped, Benicar cut back to 20 mg daily, Carvedilol cut back 12.5 mg twice day, hydralazine cut back to 10 mg tid, and Isordil was started at 10 mg daily. Discharge Potassium 4.0 Creatinine 1.98.   Lives with his wife and son.   He returns for follow up today. At last visit we stopped the Imdur and ordered CPX test. He says the hydralazine makes he feels dizzy and fuzzy-headed. Unable to tolerate. Weight up about 5 pounds. Taking lasix 2x/week. No orthopnea/PND/edema.   CPX test 12/13  Resting HR: 90 Peak HR: 150 % age predicted max HR: 89% BP rest: 121/88 BP peak: 189/112 Peak VO2: 15.2 % predicted peak VO2: 58.3% (When adjusted to the patient's ideal body weight of 176 lb (79.8 kg) the peak VO2 is 22 ml/kg (ibw)/min (63% of the ibw-adjusted predicted) VE/VCO2 slope: 26.4 OUES: 2.29 Peak RER: 1.05 Ventilatory Threshold: 12.3 % predicted peak VO2: 47.2% Peak RR 41 Peak Ventilation:  51.0 VE/MVV: 58% PETCO2 at peak: 38 O2pulse: 12 % predicted O2pulse: 67%       Review of Systems: All pertinent positives and negatives as in HPI, otherwise negative.     Past Medical History  Diagnosis Date  . Gout   . Cardiomyopathy     Idiopathic dilated; s/p Medtronic BiV ICD  . Ventricular tachycardia     s/p ICD  . HTN (hypertension)   . Sleep apnea     CPAP  . Systolic CHF     EF 15%  . CKD (chronic kidney disease)     stage III baseline Crt 1.8-2.0    Current Outpatient Prescriptions  Medication Sig Dispense Refill  . allopurinol (ZYLOPRIM) 300 MG tablet Take 300 mg by mouth daily.      Marland Kitchen atorvastatin (LIPITOR) 40 MG tablet Take 1 tablet (40 mg total) by mouth daily.  90 tablet  1  . carvedilol (COREG) 25 MG tablet Take 0.5 tablets (12.5 mg total) by mouth 2 (two) times daily with a meal.  30 tablet  3  . colchicine 0.6 MG tablet Take 1 tablet (0.6 mg total) by mouth 2 (two) times daily as needed (gout).  10 tablet  0  . digoxin (LANOXIN) 0.25 MG tablet Take 1/2 tablet by mouth once daily.      . furosemide (LASIX) 40 MG tablet Take 1 tablet Monday and Friday  30 tablet  11  . hydrALAZINE (APRESOLINE) 10 MG tablet Take 1 tablet (10 mg  total) by mouth every 8 (eight) hours.  90 tablet  3  . olmesartan (BENICAR) 20 MG tablet Take 1 tablet (20 mg total) by mouth daily.  30 tablet  3     No Known Allergies  History   Social History  . Marital Status: Married    Spouse Name: N/A    Number of Children: N/A  . Years of Education: N/A   Occupational History  . Not on file.   Social History Main Topics  . Smoking status: Never Smoker   . Smokeless tobacco: Not on file  . Alcohol Use:   . Drug Use:   . Sexually Active:    Other Topics Concern  . Not on file   Social History Narrative  . No narrative on file    No family history on file.  PHYSICAL EXAM: Filed Vitals:   03/26/12 1508  BP: 136/92  Pulse: 107  Weight: 258 lb (117.028 kg)  SpO2:  96%    General:  Well appearing. No respiratory difficulty HEENT: normal Neck: supple. JVP 7-8. Carotids 2+ bilat; no bruits. No lymphadenopathy or thryomegaly appreciated. Cor: PMI nondisplaced. Regular rate & rhythm. No rubs, gallops or murmurs. Lungs: clear Abdomen: soft, nontender, nondistended. No hepatosplenomegaly. No bruits or masses. Good bowel sounds. Extremities: no cyanosis, clubbing, rash, edema Neuro: alert & oriented x 3, cranial nerves grossly intact. moves all 4 extremities w/o difficulty. Affect pleasant.    ASSESSMENT & PLAN:

## 2012-03-28 NOTE — Assessment & Plan Note (Signed)
He has many vague symptoms but CPX looks better than I expected and argues against severe HF. He is intoelrant to numerous HF meds including spiro (severe hyperkalemia - requiring HD), hydralazine (fuzzy-headed) and imdur (severe HAs). Will stop hydralazine. Titrate carvedilol to 18.75 bid. Increase lasix to 40 mg 3x/week. Reinforced need for daily weights and reviewed use of sliding scale diuretics.

## 2012-03-30 ENCOUNTER — Telehealth: Payer: Self-pay | Admitting: Internal Medicine

## 2012-03-30 NOTE — Telephone Encounter (Signed)
Spoke w/pt in regards to beeping. Device at [employer]. Pt aware of why beeping. Offered pt to come in office so alert could be turned off. Pt will call back to schedule if wanted. Pt aware of appt on 04-12-12 @ 1015 with Dr Graciela Husbands.

## 2012-03-30 NOTE — Telephone Encounter (Signed)
Pt heard a noise from his device, sent a transmission, wants to know what it showed

## 2012-04-03 ENCOUNTER — Other Ambulatory Visit: Payer: Self-pay | Admitting: *Deleted

## 2012-04-03 MED ORDER — ATORVASTATIN CALCIUM 40 MG PO TABS: 40.0000 mg | ORAL_TABLET | Freq: Every day | ORAL | Status: DC

## 2012-04-03 NOTE — Telephone Encounter (Signed)
Fax Received. Refill Completed. Delrae Hagey Chowoe (R.M.A)   

## 2012-04-12 ENCOUNTER — Ambulatory Visit (INDEPENDENT_AMBULATORY_CARE_PROVIDER_SITE_OTHER): Payer: 59 | Admitting: Internal Medicine

## 2012-04-12 ENCOUNTER — Encounter: Payer: Self-pay | Admitting: *Deleted

## 2012-04-12 ENCOUNTER — Encounter: Payer: Self-pay | Admitting: Internal Medicine

## 2012-04-12 ENCOUNTER — Encounter (HOSPITAL_COMMUNITY): Payer: Self-pay | Admitting: Pharmacy Technician

## 2012-04-12 VITALS — BP 119/81 | HR 82 | Wt 257.0 lb

## 2012-04-12 DIAGNOSIS — I5022 Chronic systolic (congestive) heart failure: Secondary | ICD-10-CM

## 2012-04-12 DIAGNOSIS — T82198A Other mechanical complication of other cardiac electronic device, initial encounter: Secondary | ICD-10-CM | POA: Insufficient documentation

## 2012-04-12 LAB — ICD DEVICE OBSERVATION
AL IMPEDENCE ICD: 494 Ohm
BAMS-0001: 170 {beats}/min
BATTERY VOLTAGE: 2.62 V
RV LEAD IMPEDENCE ICD: 494 Ohm
TZAT-0001ATACH: 1
TZAT-0001ATACH: 3
TZAT-0001FASTVT: 2
TZAT-0002ATACH: NEGATIVE
TZAT-0004FASTVT: 8
TZAT-0004FASTVT: 8
TZAT-0004SLOWVT: 8
TZAT-0011SLOWVT: 10 ms
TZAT-0011SLOWVT: 10 ms
TZAT-0012ATACH: 150 ms
TZAT-0012ATACH: 150 ms
TZAT-0012FASTVT: 200 ms
TZAT-0012FASTVT: 200 ms
TZAT-0012SLOWVT: 200 ms
TZAT-0012SLOWVT: 200 ms
TZAT-0013SLOWVT: 3
TZAT-0013SLOWVT: 3
TZAT-0018ATACH: NEGATIVE
TZAT-0018ATACH: NEGATIVE
TZAT-0018ATACH: NEGATIVE
TZAT-0019ATACH: 6 V
TZAT-0019FASTVT: 8 V
TZAT-0019FASTVT: 8 V
TZAT-0019SLOWVT: 8 V
TZAT-0020ATACH: 1.5 ms
TZAT-0020FASTVT: 1.5 ms
TZAT-0020FASTVT: 1.5 ms
TZAT-0020SLOWVT: 1.5 ms
TZON-0003ATACH: 350 ms
TZON-0003SLOWVT: 410 ms
TZON-0004SLOWVT: 40
TZST-0001ATACH: 4
TZST-0001ATACH: 5
TZST-0001ATACH: 6
TZST-0001FASTVT: 3
TZST-0001FASTVT: 5
TZST-0001SLOWVT: 4
TZST-0001SLOWVT: 6
TZST-0002ATACH: NEGATIVE
TZST-0002ATACH: NEGATIVE
TZST-0002SLOWVT: NEGATIVE
TZST-0002SLOWVT: NEGATIVE
TZST-0002SLOWVT: NEGATIVE
TZST-0003FASTVT: 35 J
TZST-0003FASTVT: 35 J
TZST-0003FASTVT: 35 J

## 2012-04-12 LAB — BASIC METABOLIC PANEL
BUN: 18 mg/dL (ref 6–23)
Calcium: 8.9 mg/dL (ref 8.4–10.5)
GFR: 54.85 mL/min — ABNORMAL LOW (ref >60.00)
Potassium: 3.9 mEq/L (ref 3.5–5.1)
Sodium: 139 mEq/L (ref 135–145)

## 2012-04-12 LAB — CBC WITH DIFFERENTIAL/PLATELET
Basophils Absolute: 0 10*3/uL (ref 0.0–0.1)
Eosinophils Absolute: 0.1 10*3/uL (ref 0.0–0.7)
Lymphocytes Relative: 19.8 % (ref 12.0–46.0)
Monocytes Relative: 8.9 % (ref 3.0–12.0)
Neutrophils Relative %: 70.3 % (ref 43.0–77.0)
Platelets: 182 10*3/uL (ref 150.0–400.0)
RDW: 15.1 % — ABNORMAL HIGH (ref 11.5–14.6)

## 2012-04-12 LAB — PROTIME-INR: Prothrombin Time: 12.5 s — ABNORMAL HIGH (ref 10.2–12.4)

## 2012-04-12 NOTE — Assessment & Plan Note (Signed)
We discussed the need to recheck his potassium. We also discussed the lower rate protection at 145 significant exercise with heart rate monitoring up to about 1:30 beats per minute

## 2012-04-12 NOTE — Progress Notes (Signed)
Patient Care Team: Catha Gosselin, MD as PCP - General (Family Medicine) Duke Salvia, MD as PCP - Cardiology (Cardiology)   HPI  [name] is a 52 y.o. male is seen in followup for congestive heart failure in the setting of nonischemic cardiomyopathy. He is status post CRT-D. implantation. He had been previously treated for VT with amiodarone which was discontinued because of  lightheadedness He was seen in jan 2103 with flurries of VT requiring therapy which I had hoped might be attributable to mechanical-electric issues aggravated by CHF    He was hospitalized again 11/13 for multiple inappropriate ICD shocks attributed to hypokalemia secondary acute renal insufficiency and T wave oversensing. The device was reprogrammed to decrease sensitivity from 0.3-0.6 mV and the NID  was increased.  Previously had been on amiodarone which he was unable to tolerate. Last potassium was 1/14 but the sample was hemolyzed.  He also has OSA on CPAP. He is now being followed by the heart failure clinic  Were CPX was much better than expected.   He also tells that his device is beating    Past Medical History  Diagnosis Date  . Gout   . Cardiomyopathy     Idiopathic dilated; s/p Medtronic BiV ICD  . Ventricular tachycardia     s/p ICD  . HTN (hypertension)   . Sleep apnea     CPAP  . Systolic CHF     EF 15%  . CKD (chronic kidney disease)     stage III baseline Crt 1.8-2.0    Past Surgical History  Procedure Date  . Cholecystectomy   . Cardiac catheterization     he was found to have normal coronary arteries but with a globally dilated and hypocontractile heart  . US echocardiography 03-17-2008, 07-03-2006    EF 15-20%, EF 15-20%    Current Outpatient Prescriptions  Medication Sig Dispense Refill  . allopurinol (ZYLOPRIM) 300 MG tablet Take 300 mg by mouth daily.      Marland Kitchen atorvastatin (LIPITOR) 40 MG tablet Take 1 tablet (40 mg total) by mouth daily.  90 tablet  1  . carvedilol  (COREG) 12.5 MG tablet Take one and one-half tablet (18.75 mg) bid  60 tablet  3  . colchicine 0.6 MG tablet Take 1 tablet (0.6 mg total) by mouth 2 (two) times daily as needed (gout).  10 tablet  0  . digoxin (LANOXIN) 0.25 MG tablet Take 1/2 tablet by mouth once daily.      . furosemide (LASIX) 40 MG tablet Take 1 tablet Monday, Wednesday, andFriday  30 tablet  11  . olmesartan (BENICAR) 20 MG tablet Take 1 tablet (20 mg total) by mouth daily.  30 tablet  3    No Known Allergies  Review of Systems negative except from HPI and PMH  Physical Exam BP 119/81  Pulse 82  Wt 257 lb (116.574 kg) Well developed and well nourished in no acute distress HENT normal E scleral and icterus clear Neck Supple JVP flat; carotids brisk and full Clear to ausculation Device pocket well healed; without hematoma or erythema regular rate and rhythm, no murmurs gallops or rub Soft with active bowel sounds No clubbing cyanosis  no Edema Alert and oriented, grossly normal motor and sensory function Skin Warm and Dry    Assessment and  Plan

## 2012-04-12 NOTE — Assessment & Plan Note (Signed)
Stable. Euvolemic. ?

## 2012-04-12 NOTE — Assessment & Plan Note (Signed)
As above.

## 2012-04-12 NOTE — Assessment & Plan Note (Signed)
No intercurrent Ventricular tachycardia  

## 2012-04-12 NOTE — Assessment & Plan Note (Signed)
Device has reached ERI. We'll undertake generator replacement. Have reviewed the potential benefits and risks of ICD implantation including but not limited to death, perforation of heart or lung, lead dislodgement, infection,  device malfunction and inappropriate shocks.  The patient and express understanding  and are willing to proceed.

## 2012-04-12 NOTE — Patient Instructions (Signed)
Your physician has recommended you make the following change in your medication:  1) Decrease coreg (carvedilol) to 12.5 mg one tablet in the morning and one & a half tablets in the evening.  Your physician recommends that you have lab work today: bmp/cbc/inr  Your physician has recommended that you have a defibrillator generator (battery) change.  Please see the instruction sheet given to you today for more information.

## 2012-04-12 NOTE — Assessment & Plan Note (Signed)
Still having some problems with dizziness. We will decrease his carvedilol 18. 75 to 12.5/18.75

## 2012-04-23 ENCOUNTER — Encounter (HOSPITAL_COMMUNITY): Payer: Self-pay

## 2012-04-23 ENCOUNTER — Ambulatory Visit (HOSPITAL_COMMUNITY)
Admission: RE | Admit: 2012-04-23 | Discharge: 2012-04-23 | Disposition: A | Discharge status: Home / Self Care | Payer: 59 | Source: Ambulatory Visit | Attending: Internal Medicine | Admitting: Internal Medicine

## 2012-04-23 VITALS — BP 130/88 | HR 92 | Wt 261.0 lb

## 2012-04-23 DIAGNOSIS — I5022 Chronic systolic (congestive) heart failure: Secondary | ICD-10-CM

## 2012-04-23 LAB — BASIC METABOLIC PANEL
BUN: 16 mg/dL (ref 6–23)
CO2: 24 mEq/L (ref 19–32)
GFR calc non Af Amer: 68 mL/min — ABNORMAL LOW (ref >90)
Glucose, Bld: 114 mg/dL — ABNORMAL HIGH (ref 70–99)
Potassium: 4.1 mEq/L (ref 3.5–5.1)
Sodium: 141 mEq/L (ref 135–145)

## 2012-04-23 MED ORDER — CARVEDILOL 12.5 MG PO TABS: 18.7500 mg | ORAL_TABLET | Freq: Two times a day (BID) | ORAL | Status: DC

## 2012-04-23 NOTE — Progress Notes (Signed)
Patient ID: [name], male   DOB: Jul 15, 1960, 52 y.o.   MRN: [id number] PCP: Dr Clarene Duke Nephrologist: Dr [name] Cardiologist: Dr Graciela Husbands   HPI:  [name] is a 3 year old with a PMH of gout, nonischemic cardiomyopathy (EF 15%) S/P CRT-D implantation 2010, VT with amiodarone which was discontinued because of l;ightheadedness, prostatitis, and OSA (CPAP). LHC 2008: Normal cors.  He is intoelrant to numerous HF meds including spiro (severe hyperkalemia - requiring HD), hydralazine (fuzzy-headed) and imdur (severe HAs).   He was seen in Jan 2103 with flurries of VT.    RHC 01/28/12  RA = 1  RV = 28/2/4  PA = 32/17 (22)  PCW = 6  Fick cardiac output/index = 5.1/2.2  Thermo cardiac output/index = 5.0/2.1  PVR = 4.0 Woods  FA sat = 98%  PA sat = 64%, 68%  11/29/11 ECHO EF 15% Diffuse hypokinesis. Features are consistent with a pseudonormal left ventricular filling pattern, with concomitant abnormal relaxation and increased filling pressure (grade 2 diastolic dysfunction).  Admitted to [facility] 11/21/2013after his ICD fired 5 times  Potassium >7.5  Required urgent dialysis. Discharged form [facility] 01/29/12. Discharge weight 250 pounds. Spironolactone stopped, Benicar cut back to 20 mg daily, Carvedilol cut back 12.5 mg twice day, hydralazine cut back to 10 mg tid, and Isordil was started at 10 mg daily. Discharge Potassium 4.0 Creatinine 1.98.    CPX test 12/13 Resting HR: 90 Peak HR: 150 % age predicted max HR: 89% BP rest: 121/88 BP peak: 189/112 Peak VO2: 15.2 % predicted peak VO2: 58.3% (When adjusted to the patient's ideal body weight of 176 lb (79.8 kg) the peak VO2 is 22 ml/kg (ibw)/min (63% of the ibw-adjusted predicted) VE/VCO2 slope: 26.4 OUES: 2.29 Peak RER: 1.05 Ventilatory Threshold: 12.3 % predicted peak VO2: 47.2% Peak RR 41 Peak Ventilation: 51.0 VE/MVV: 58% PETCO2 at peak: 38 O2pulse: 12 % predicted O2pulse: 67%  04/12/12 Creatinine 1.7 Potassium 3.9   He returns  for follow up. Last visit, night time carvedilol was increased to 18.75 mg. Denies SOB/PND/Orthopnea. No ICD shocks.  Sleeps on 1 pillow at night. Weight at home 250-254 pounds. He liberalized his diet and been eating out over the weekend with high salt foods such as pizza and japanese fried foods. Drinking less > 2 liters. Goes to the gym 2 times per week and he is able to walk 1 mile on the treadmill. . Plan for generator change 04/26/12 by Dr Graciela Husbands. He plans to follow up with Dr  [name].      Review of Systems: All pertinent positives and negatives as in HPI, otherwise negative.     Past Medical History  Diagnosis Date  . Gout   . Cardiomyopathy     Idiopathic dilated; s/p Medtronic BiV ICD  . Ventricular tachycardia     s/p ICD  . HTN (hypertension)   . Sleep apnea     CPAP  . Systolic CHF     EF 15%  . CKD (chronic kidney disease)     stage III baseline Crt 1.8-2.0    Current Outpatient Prescriptions  Medication Sig Dispense Refill  . allopurinol (ZYLOPRIM) 300 MG tablet Take 300 mg by mouth daily.      Marland Kitchen atorvastatin (LIPITOR) 40 MG tablet Take 1 tablet (40 mg total) by mouth daily.  90 tablet  1  . carvedilol (COREG) 12.5 MG tablet Take one tablet by mouth in the morning and 1 & 1/2 tablets by mouth  every evening      . colchicine 0.6 MG tablet Take 1 tablet (0.6 mg total) by mouth 2 (two) times daily as needed (gout).  10 tablet  0  . digoxin (LANOXIN) 0.25 MG tablet Take 1/2 tablet by mouth once daily.      . furosemide (LASIX) 40 MG tablet Take 1 tablet Monday, Wednesday, andFriday  30 tablet  11  . olmesartan (BENICAR) 20 MG tablet Take 1 tablet (20 mg total) by mouth daily.  30 tablet  3   No current facility-administered medications for this encounter.     No Known Allergies  History   Social History  . Marital Status: Married    Spouse Name: N/A    Number of Children: N/A  . Years of Education: N/A   Occupational History  . Not on file.   Social  History Main Topics  . Smoking status: Never Smoker   . Smokeless tobacco: Not on file  . Alcohol Use:   . Drug Use:   . Sexually Active:    Other Topics Concern  . Not on file   Social History Narrative  . No narrative on file    No family history on file.  PHYSICAL EXAM: Filed Vitals:   04/23/12 1512  BP: 130/88  Pulse: 92  Weight: 261 lb (118.389 kg)  SpO2: 98%    General:  Well appearing. No respiratory difficulty HEENT: normal Neck: supple. JVP 5-6. Carotids 2+ bilat; no bruits. No lymphadenopathy or thryomegaly appreciated. Cor: PMI nondisplaced. Regular rate & rhythm. No rubs, gallops or murmurs. Lungs: clear Abdomen: soft, nontender, mildly distended. No hepatosplenomegaly. No bruits or masses. Good bowel sounds. Extremities: no cyanosis, clubbing, rash, edema Neuro: alert & oriented x 3, cranial nerves grossly intact. moves all 4 extremities w/o difficulty. Affect pleasant.    ASSESSMENT & PLAN:

## 2012-04-23 NOTE — Assessment & Plan Note (Addendum)
NYHA II. Volume status stable despite 3 pound weight gain which is likely due liberalized diet (fluids and food)over the last week. Continue current diuretic regimen. Increase day time carvedilol to 18.75 mg and continue night time carvedilol at 18.75 mg. Reinforced low salt food choices, limiting fluids to < 2 liters per day, daily weights and medication compliance. Offered HF diet class with dietitian March 4 to assist with low salt food choices. Check BMET today. Follow up in 3 weeks and continue to up titrate carvedilol.    He is intoelrant to numerous HF meds including spiro (severe hyperkalemia - requiring HD), hydralazine (fuzzy-headed) and imdur (severe HAs).

## 2012-04-23 NOTE — Patient Instructions (Addendum)
Follow up in 3 weeks with Dr Gala Romney  Take Carvedilol 18.75 mg twice a day  1 1/2 tab in am and pm   Do the following things EVERYDAY: 1) Weigh yourself in the morning before breakfast. Write it down and keep it in a log. 2) Take your medicines as prescribed 3) Eat low salt foods-Limit salt (sodium) to 2000 mg per day.  4) Stay as active as you can everyday 5) Limit all fluids for the day to less than 2 liters

## 2012-05-01 MED ORDER — CEFAZOLIN SODIUM 10 G IJ SOLR
3.0000 g | INTRAMUSCULAR | Status: DC
  Filled 2012-05-01: qty 3000

## 2012-05-01 MED ORDER — SODIUM CHLORIDE 0.9 % IR SOLN
80.0000 mg | Status: DC
  Filled 2012-05-01: qty 2

## 2012-05-02 ENCOUNTER — Encounter (HOSPITAL_COMMUNITY)
Admission: RE | Disposition: A | Discharge status: Home / Self Care | Payer: Self-pay | Source: Ambulatory Visit | Attending: Internal Medicine

## 2012-05-02 ENCOUNTER — Encounter (HOSPITAL_COMMUNITY): Payer: Self-pay | Admitting: Internal Medicine

## 2012-05-02 ENCOUNTER — Ambulatory Visit (HOSPITAL_COMMUNITY)
Admission: RE | Admit: 2012-05-02 | Discharge: 2012-05-02 | Disposition: A | Discharge status: Home / Self Care | Payer: 59 | Source: Ambulatory Visit | Attending: Internal Medicine | Admitting: Internal Medicine

## 2012-05-02 DIAGNOSIS — Z4502 Encounter for adjustment and management of automatic implantable cardiac defibrillator: Secondary | ICD-10-CM | POA: Insufficient documentation

## 2012-05-02 DIAGNOSIS — I428 Other cardiomyopathies: Secondary | ICD-10-CM | POA: Insufficient documentation

## 2012-05-02 DIAGNOSIS — N183 Chronic kidney disease, stage 3 unspecified: Secondary | ICD-10-CM | POA: Insufficient documentation

## 2012-05-02 DIAGNOSIS — I129 Hypertensive chronic kidney disease with stage 1 through stage 4 chronic kidney disease, or unspecified chronic kidney disease: Secondary | ICD-10-CM | POA: Insufficient documentation

## 2012-05-02 DIAGNOSIS — I509 Heart failure, unspecified: Secondary | ICD-10-CM | POA: Insufficient documentation

## 2012-05-02 DIAGNOSIS — I5022 Chronic systolic (congestive) heart failure: Secondary | ICD-10-CM

## 2012-05-02 HISTORY — DX: Presence of automatic (implantable) cardiac defibrillator: Z95.810

## 2012-05-02 HISTORY — PX: IMPLANTABLE CARDIOVERTER DEFIBRILLATOR (ICD) GENERATOR CHANGE: SHX5469

## 2012-05-02 LAB — SURGICAL PCR SCREEN
MRSA, PCR: NEGATIVE
Staphylococcus aureus: POSITIVE — AB

## 2012-05-02 LAB — BASIC METABOLIC PANEL
CO2: 25 mEq/L (ref 19–32)
Chloride: 103 mEq/L (ref 96–112)
GFR calc Af Amer: 72 mL/min — ABNORMAL LOW (ref >90)
Potassium: 4 mEq/L (ref 3.5–5.1)

## 2012-05-02 LAB — CBC
Platelets: 173 10*3/uL (ref 150–400)
RBC: 5.49 MIL/uL (ref 4.22–5.81)
RDW: 13.5 % (ref 11.5–15.5)
WBC: 10.5 10*3/uL (ref 4.0–10.5)

## 2012-05-02 SURGERY — ICD GENERATOR CHANGE
Anesthesia: LOCAL

## 2012-05-02 MED ORDER — ONDANSETRON HCL 4 MG/2ML IJ SOLN: 4.0000 mg | Freq: Four times a day (QID) | INTRAMUSCULAR | Status: DC | PRN

## 2012-05-02 MED ORDER — MIDAZOLAM HCL 2 MG/2ML IJ SOLN
INTRAMUSCULAR | Status: AC
  Filled 2012-05-02: qty 2

## 2012-05-02 MED ORDER — SODIUM CHLORIDE 0.9 % IJ SOLN: 3.0000 mL | Freq: Two times a day (BID) | INTRAMUSCULAR | Status: DC

## 2012-05-02 MED ORDER — CHLORHEXIDINE GLUCONATE 4 % EX LIQD
60.0000 mL | Freq: Once | CUTANEOUS | Status: DC
  Filled 2012-05-02: qty 60

## 2012-05-02 MED ORDER — SODIUM CHLORIDE 0.9 % IV SOLN: INTRAVENOUS | Status: DC

## 2012-05-02 MED ORDER — SODIUM CHLORIDE 0.9 % IV SOLN: 250.0000 mL | INTRAVENOUS | Status: DC

## 2012-05-02 MED ORDER — MUPIROCIN 2 % EX OINT
TOPICAL_OINTMENT | CUTANEOUS | Status: AC
  Administered 2012-05-02: 1 via NASAL
  Filled 2012-05-02: qty 22

## 2012-05-02 MED ORDER — LIDOCAINE HCL (PF) 1 % IJ SOLN
INTRAMUSCULAR | Status: AC
  Filled 2012-05-02: qty 60

## 2012-05-02 MED ORDER — MUPIROCIN 2 % EX OINT
TOPICAL_OINTMENT | Freq: Two times a day (BID) | CUTANEOUS | Status: DC
  Filled 2012-05-02: qty 22

## 2012-05-02 MED ORDER — FENTANYL CITRATE 0.05 MG/ML IJ SOLN
INTRAMUSCULAR | Status: AC
  Filled 2012-05-02: qty 2

## 2012-05-02 MED ORDER — SODIUM CHLORIDE 0.9 % IJ SOLN: 3.0000 mL | INTRAMUSCULAR | Status: DC | PRN

## 2012-05-02 MED ORDER — ACETAMINOPHEN 325 MG PO TABS: 325.0000 mg | ORAL_TABLET | ORAL | Status: DC | PRN

## 2012-05-02 MED ORDER — SODIUM CHLORIDE 0.45 % IV SOLN
INTRAVENOUS | Status: DC
  Administered 2012-05-02: 10:00:00 via INTRAVENOUS

## 2012-05-02 NOTE — H&P (View-Only) (Signed)
Patient Care Team: Kevin Little, MD as PCP - General (Family Medicine) Steven C Klein, MD as PCP - Cardiology (Cardiology)   HPI  [name] is a 52 y.o. male is seen in followup for congestive heart failure in the setting of nonischemic cardiomyopathy. He is status post CRT-D. implantation. He had been previously treated for VT with amiodarone which was discontinued because of  lightheadedness He was seen in jan 2103 with flurries of VT requiring therapy which I had hoped might be attributable to mechanical-electric issues aggravated by CHF    He was hospitalized again 11/13 for multiple inappropriate ICD shocks attributed to hypokalemia secondary acute renal insufficiency and T wave oversensing. The device was reprogrammed to decrease sensitivity from 0.3-0.6 mV and the NID  was increased.  Previously had been on amiodarone which he was unable to tolerate. Last potassium was 1/14 but the sample was hemolyzed.  He also has OSA on CPAP. He is now being followed by the heart failure clinic  Were CPX was much better than expected.   He also tells that his device is beating    Past Medical History  Diagnosis Date  . Gout   . Cardiomyopathy     Idiopathic dilated; s/p Medtronic BiV ICD  . Ventricular tachycardia     s/p ICD  . HTN (hypertension)   . Sleep apnea     CPAP  . Systolic CHF     EF 15%  . CKD (chronic kidney disease)     stage III baseline Crt 1.8-2.0    Past Surgical History  Procedure Date  . Cholecystectomy   . Cardiac catheterization     he was found to have normal coronary arteries but with a globally dilated and hypocontractile heart  . Us echocardiography 03-17-2008, 07-03-2006    EF 15-20%, EF 15-20%    Current Outpatient Prescriptions  Medication Sig Dispense Refill  . allopurinol (ZYLOPRIM) 300 MG tablet Take 300 mg by mouth daily.      . atorvastatin (LIPITOR) 40 MG tablet Take 1 tablet (40 mg total) by mouth daily.  90 tablet  1  . carvedilol  (COREG) 12.5 MG tablet Take one and one-half tablet (18.75 mg) bid  60 tablet  3  . colchicine 0.6 MG tablet Take 1 tablet (0.6 mg total) by mouth 2 (two) times daily as needed (gout).  10 tablet  0  . digoxin (LANOXIN) 0.25 MG tablet Take 1/2 tablet by mouth once daily.      . furosemide (LASIX) 40 MG tablet Take 1 tablet Monday, Wednesday, andFriday  30 tablet  11  . olmesartan (BENICAR) 20 MG tablet Take 1 tablet (20 mg total) by mouth daily.  30 tablet  3    No Known Allergies  Review of Systems negative except from HPI and PMH  Physical Exam BP 119/81  Pulse 82  Wt 257 lb (116.574 kg) Well developed and well nourished in no acute distress HENT normal E scleral and icterus clear Neck Supple JVP flat; carotids brisk and full Clear to ausculation Device pocket well healed; without hematoma or erythema regular rate and rhythm, no murmurs gallops or rub Soft with active bowel sounds No clubbing cyanosis  no Edema Alert and oriented, grossly normal motor and sensory function Skin Warm and Dry    Assessment and  Plan   

## 2012-05-02 NOTE — CV Procedure (Signed)
ICD Criteria  Current LVEF (within 6 months):15%  NYHA Functional Classification: Class III  Heart Failure History:  Yes, Duration of heart failure since onset is > 9 months  Non-Ischemic Dilated Cardiomyopathy History:  Yes, timeframe is > 9 months  Atrial Fibrillation/Atrial Flutter:  no  Ventricular Tachycardia History:  Yes, Hemodynamic status unknown. VT Type is unknown.  Cardiac Arrest History:  No  History of Syndromes with Risk of Sudden Death:  No.  Previous ICD:  Yes, ICD Type:  CRT-D, Reason for ICD:  Primary prevention.  10%  Electrophysiology Study: No.  Anticoagulation Therapy:  Patient is NOT on anticoagulation therapy.   Beta Blocker Therapy:  Yes.   Ace Inhibitor Therapy:  Yes.

## 2012-05-02 NOTE — CV Procedure (Signed)
.  sop THAER MIYOSHI [id number]  [id number]  Preop YQ:MVHQ chf ICD-CRT AT END of life   Postop Dx same/   Procedure: Explantation and implantation of new generator assessment of ICD lead  Cx: None   Dictation number [id number]  Sherryl Manges, MD 05/02/2012 11:34 AM

## 2012-05-02 NOTE — Interval H&P Note (Signed)
History and Physical Interval Note:  05/02/2012 9:57 AM  [name]  has presented today for surgery, with the diagnosis of End of life  The various methods of treatment have been discussed with the patient and family. After consideration of risks, benefits and other options for treatment, the patient has consented to  Procedure(s): ICD GENERATOR CHANGE (N/A) as a surgical intervention .  The patient's history has been reviewed, patient examined, no change in status, stable for surgery.  I have reviewed the patient's chart and labs.  Questions were answered to the patient's satisfaction.     Sherryl Manges

## 2012-05-03 NOTE — Op Note (Signed)
NAME:  [name], [name] NO.:  [id number]  MEDICAL RECORD NO.:  [id number]  LOCATION:  [facility]                         FACILITY:  [facility]  PHYSICIAN:  Duke Salvia, MD, FACCDATE OF BIRTH:  Dec 19, 1960  DATE OF PROCEDURE:  05/02/2012 DATE OF DISCHARGE:  05/02/2012                              OPERATIVE REPORT   PREOPERATIVE DIAGNOSIS:  Nonischemic cardiomyopathy, congestive heart failure, ICD CRT at end of life.  POSTOPERATIVE DIAGNOSIS:  Nonischemic cardiomyopathy, congestive heart failure, ICD CRT at end of life.  PROCEDURE:  Explantation of a previously implanted device, implantation of a new device.  Assessment of the lead system.  Assessment of ICD.  COMPLICATIONS:  None apparent.  DESCRIPTION OF PROCEDURE:  Following obtaining informed consent, the patient was brought to the electrophysiology laboratory and placed on the fluoroscopic table in supine position.  After routine prep and drape of the left upper chest, lidocaine was infiltrated just cephalad to the previous incision and carried down to layer of device pocket using sharp dissection and electrocautery.  The pocket was opened.  The device was explanted.  The first 4 or 5 cm of the leads were freed up. Interrogation of the lead demonstrated Medtronic 5076 atrial lead, amplitude 2.5 with a pace impedance of 403, threshold 0.3 at 0.5.  The LV lead was a 4194, a bipolar configuration with impedance of 873, threshold 0.7 at 0.5 and the RV lead was a 6947, with an amplitude of 18.4, impedance of 507, threshold 0.7 at 0.5.  Pacing through the proximal coil was 420, through the distal coil was 389.  With these acceptable parameters recorded, the leads were then attached to a Medtronic Viva XT ICD, serial #[id number].  Through the device, bipolar P-wave was 2.4, the pace impedance of 475, a threshold 0.5 at 0.4.  The R-wave was 20 with a pace impedance of 418, threshold 0.7 at 0.4.  The LV impedance was 646  with a threshold 0.75 at 0.4.  Proximal coil impedance was 60, distal coil impedance was 63.  With these acceptable parameters recorded, the lead was placed and the pulse generator was placed in the pocket.  The pocket had to be extended cephalad to allow for housing of the larger device.  The lead was secured to the prepectoral fascia following antibiotic irrigation.  The pocket was closed in 3 layers in normal fashion.  The wound was washed, dried, and a benzoin and Steri-Strip dressing was applied.  Needle counts, sponge counts, and instrument counts were correct at the end of the procedure according to the staff.  The patient tolerated the procedure without apparent complication.     Duke Salvia, MD, [facility]     SCK/MEDQ  D:  05/02/2012  T:  05/03/2012  Job:  [id number]

## 2012-05-07 ENCOUNTER — Telehealth: Payer: Self-pay | Admitting: Internal Medicine

## 2012-05-07 NOTE — Telephone Encounter (Signed)
New Prob    Pt had a procedure done by Dr. Graciela Husbands, would like to know when its safe for him to return to work.

## 2012-05-08 NOTE — Telephone Encounter (Signed)
Any time thnaks

## 2012-05-08 NOTE — Telephone Encounter (Signed)
I left a message for the patient to call. Voice mail not identified.

## 2012-05-08 NOTE — Telephone Encounter (Signed)
Forwarded to Dr. Graciela Husbands.  Patient does computer work.

## 2012-05-09 ENCOUNTER — Telehealth: Payer: Self-pay | Admitting: Internal Medicine

## 2012-05-09 ENCOUNTER — Encounter: Payer: Self-pay | Admitting: *Deleted

## 2012-05-09 NOTE — Telephone Encounter (Signed)
New Prob   Pt returning phone call from yesterday from [facility].

## 2012-05-09 NOTE — Telephone Encounter (Signed)
Work note provided.

## 2012-05-10 NOTE — Telephone Encounter (Signed)
The patient is aware that he can return to work. He states he came in today and picked up a note.

## 2012-05-14 ENCOUNTER — Encounter: Payer: Self-pay | Admitting: Internal Medicine

## 2012-05-14 ENCOUNTER — Other Ambulatory Visit: Payer: Self-pay

## 2012-05-14 ENCOUNTER — Ambulatory Visit (INDEPENDENT_AMBULATORY_CARE_PROVIDER_SITE_OTHER): Payer: 59 | Admitting: *Deleted

## 2012-05-14 DIAGNOSIS — I5022 Chronic systolic (congestive) heart failure: Secondary | ICD-10-CM

## 2012-05-14 DIAGNOSIS — I428 Other cardiomyopathies: Secondary | ICD-10-CM

## 2012-05-14 NOTE — Progress Notes (Signed)
Wound check-ICD 

## 2012-05-17 ENCOUNTER — Ambulatory Visit (HOSPITAL_COMMUNITY)
Admission: RE | Admit: 2012-05-17 | Discharge: 2012-05-17 | Disposition: A | Discharge status: Home / Self Care | Payer: 59 | Source: Ambulatory Visit | Attending: Internal Medicine | Admitting: Internal Medicine

## 2012-05-17 ENCOUNTER — Encounter (HOSPITAL_COMMUNITY): Payer: Self-pay

## 2012-05-17 VITALS — BP 126/98 | HR 102 | Wt 257.0 lb

## 2012-05-17 DIAGNOSIS — I5022 Chronic systolic (congestive) heart failure: Secondary | ICD-10-CM

## 2012-05-17 DIAGNOSIS — M109 Gout, unspecified: Secondary | ICD-10-CM

## 2012-05-17 MED ORDER — PREDNISONE 20 MG PO TABS: 40.0000 mg | ORAL_TABLET | Freq: Two times a day (BID) | ORAL | Status: DC

## 2012-05-17 NOTE — Assessment & Plan Note (Signed)
Currently with gout attack not responding to colchicine.  Will give prednisone for 3 days.

## 2012-05-17 NOTE — Patient Instructions (Addendum)
Can pick up prednisone for gout.  If fatigue not improving can cut back carvedilol to 1 tab in the morning and 1.5 tabs in the evening.  Take fluid pill when weight increasing or abdominal bloating.  Follow up 4-6 weeks.  Do the following things EVERYDAY: 1) Weigh yourself in the morning before breakfast. Write it down and keep it in a log. 2) Take your medicines as prescribed 3) Eat low salt foods-Limit salt (sodium) to 2000 mg per day.  4) Stay as active as you can everyday 5) Limit all fluids for the day to less than 2 liters

## 2012-05-17 NOTE — Assessment & Plan Note (Signed)
Volume status well controlled.  Have discussed use of sliding scale lasix but he will have to start weighing daily.  He agrees to daily weights again.  With increased fatigue and history of difficultly with tolerating HF meds in the past will try to cut carvedilol to 12.5 mg in the morning and continue 1.5 tabs (18.75 mg) at night.  He will let us know if this does not improve his fatigue.

## 2012-05-17 NOTE — Progress Notes (Signed)
PCP: Dr Clarene Duke Nephrologist: Dr [name] Cardiologist: Dr Graciela Husbands   HPI:  Mr Degidio is a 52 year old with a PMH of gout, nonischemic cardiomyopathy (EF 15%) S/P CRT-D implantation 2010, VT with amiodarone which was discontinued because of lightheadedness, prostatitis, and OSA (CPAP). LHC 2008: Normal cors.  He is intoelrant to numerous HF meds including spiro (severe hyperkalemia - requiring HD), hydralazine (fuzzy-headed) and imdur (severe HAs).   He was seen in Jan 2103 with flurries of VT.   RHC 01/28/12  RA = 1  RV = 28/2/4  PA = 32/17 (22)  PCW = 6  Fick cardiac output/index = 5.1/2.2  Thermo cardiac output/index = 5.0/2.1  PVR = 4.0 Woods  FA sat = 98%  PA sat = 64%, 68%  11/29/11 ECHO EF 15% Diffuse hypokinesis. Features are consistent with a pseudonormal left ventricular filling pattern, with concomitant abnormal relaxation and increased filling pressure (grade 2 diastolic dysfunction).  Admitted to [facility] 11/21/2013after his ICD fired 5 times  Potassium >7.5  Required urgent dialysis. Discharged form [facility] 01/29/12. Discharge weight 250 pounds. Spironolactone stopped, Benicar cut back to 20 mg daily, Carvedilol cut back 12.5 mg twice day, hydralazine cut back to 10 mg tid, and Isordil was started at 10 mg daily. Discharge Potassium 4.0 Creatinine 1.98.    CPX test 12/13 Resting HR: 90 Peak HR: 150 % age predicted max HR: 89% BP rest: 121/88 BP peak: 189/112 Peak VO2: 15.2 % predicted peak VO2: 58.3% (When adjusted to the patient's ideal body weight of 176 lb (79.8 kg) the peak VO2 is 22 ml/kg (ibw)/min (63% of the ibw-adjusted predicted) VE/VCO2 slope: 26.4 OUES: 2.29 Peak RER: 1.05 Ventilatory Threshold: 12.3 % predicted peak VO2: 47.2% Peak RR 41 Peak Ventilation: 51.0 VE/MVV: 58% PETCO2 at peak: 38 O2pulse: 12 % predicted O2pulse: 67%  He returns for follow up today.  He had gen change out on 2/20 by Dr. Graciela Husbands.  He feels good today besides feeling tired since  carvedilol increased.  He has gout in rt foot, he started on colchicine 4 days ago without much relief.  He denies ICD shocks.  No dyspnea, orthopnea or PND.  He is not weighing but watching low sodium diet.  He was going to gym but has not in the past week due to gout.     Review of Systems: All pertinent positives and negatives as in HPI, otherwise negative.     Past Medical History  Diagnosis Date  . Gout   . Cardiomyopathy     Idiopathic dilated;   Marland Kitchen Ventricular tachycardia     s/p ICD  . HTN (hypertension)   . Sleep apnea     CPAP  . Systolic CHF     EF 15%  . CKD (chronic kidney disease)     stage III baseline Crt 1.8-2.0  . Biventricular ICD (implantable cardiac defibrillator) Medtronic ]     DOI 2008/ upgrade 2010/ Gen Change 2014    Current Outpatient Prescriptions  Medication Sig Dispense Refill  . atorvastatin (LIPITOR) 40 MG tablet Take 1 tablet (40 mg total) by mouth daily.  90 tablet  1  . carvedilol (COREG) 12.5 MG tablet Take 1.5 tablets (18.75 mg total) by mouth 2 (two) times daily with a meal.  90 tablet  3  . colchicine 0.6 MG tablet Take 1 tablet (0.6 mg total) by mouth 2 (two) times daily as needed (gout).  10 tablet  0  . digoxin (LANOXIN) 0.25 MG tablet Take  1/2 tablet by mouth once daily.      Marland Kitchen olmesartan (BENICAR) 20 MG tablet Take 1 tablet (20 mg total) by mouth daily.  30 tablet  3  . allopurinol (ZYLOPRIM) 300 MG tablet Take 300 mg by mouth daily.      . furosemide (LASIX) 40 MG tablet Take 1 tablet Monday, Wednesday, andFriday  30 tablet  11   No current facility-administered medications for this encounter.     No Known Allergies  History   Social History  . Marital Status: Married    Spouse Name: N/A    Number of Children: N/A  . Years of Education: N/A   Occupational History  . Not on file.   Social History Main Topics  . Smoking status: Never Smoker   . Smokeless tobacco: Not on file  . Alcohol Use:   . Drug Use:   . Sexually  Active:    Other Topics Concern  . Not on file   Social History Narrative  . No narrative on file    No family history on file.  PHYSICAL EXAM: Filed Vitals:   05/17/12 0900  BP: 126/98  Pulse: 102  Weight: 257 lb (116.574 kg)  SpO2: 98%    General:  Well appearing. No respiratory difficulty HEENT: normal Neck: supple. JVP 5-6. Carotids 2+ bilat; no bruits. No lymphadenopathy or thryomegaly appreciated. Cor: PMI nondisplaced. Regular rate & rhythm. No rubs, gallops or murmurs. Lungs: clear Abdomen: soft, nontender, nondistended. No hepatosplenomegaly. No bruits or masses. Good bowel sounds. Extremities: no cyanosis, clubbing, rash, edema Neuro: alert & oriented x 3, cranial nerves grossly intact. moves all 4 extremities w/o difficulty. Affect pleasant.    ASSESSMENT & PLAN:

## 2012-05-21 LAB — ICD DEVICE OBSERVATION
AL IMPEDENCE ICD: 475 Ohm
LV LEAD IMPEDENCE ICD: 608 Ohm
LV LEAD THRESHOLD: 0.75 V
RV LEAD AMPLITUDE: 18.3 mv
TZON-0003FASTVT: 300 ms
TZON-0003SLOWVT: 410.9 ms
VENTRICULAR PACING ICD: 96.3 pct

## 2012-06-18 ENCOUNTER — Other Ambulatory Visit (HOSPITAL_COMMUNITY): Payer: Self-pay | Admitting: Cardiology

## 2012-06-20 ENCOUNTER — Ambulatory Visit (HOSPITAL_COMMUNITY): Payer: 59

## 2012-06-27 ENCOUNTER — Ambulatory Visit (INDEPENDENT_AMBULATORY_CARE_PROVIDER_SITE_OTHER): Payer: 59 | Admitting: Emergency Medicine

## 2012-06-27 VITALS — BP 124/68 | HR 105 | Temp 101.1°F | Resp 16 | Ht 70.0 in | Wt 256.6 lb

## 2012-06-27 DIAGNOSIS — M109 Gout, unspecified: Secondary | ICD-10-CM

## 2012-06-27 DIAGNOSIS — R509 Fever, unspecified: Secondary | ICD-10-CM

## 2012-06-27 DIAGNOSIS — R300 Dysuria: Secondary | ICD-10-CM

## 2012-06-27 DIAGNOSIS — N390 Urinary tract infection, site not specified: Secondary | ICD-10-CM

## 2012-06-27 LAB — POCT CBC
Granulocyte percent: 86.8 %G — AB (ref 37–80)
HCT, POC: 50.2 % (ref 43.5–53.7)
Hemoglobin: 16.6 g/dL (ref 14.1–18.1)
Lymph, poc: 1.8 (ref 0.6–3.4)
MCV: 88.3 fL (ref 80–97)
POC Granulocyte: 20.7 — AB (ref 2–6.9)
POC LYMPH PERCENT: 7.7 %L — AB (ref 10–50)
RDW, POC: 15.3 %

## 2012-06-27 LAB — POCT UA - MICROSCOPIC ONLY
Bacteria, U Microscopic: NEGATIVE
Mucus, UA: POSITIVE

## 2012-06-27 LAB — POCT URINALYSIS DIPSTICK
Bilirubin, UA: NEGATIVE
Glucose, UA: NEGATIVE
Ketones, UA: NEGATIVE
Spec Grav, UA: 1.025
pH, UA: 6.5

## 2012-06-27 LAB — IFOBT (OCCULT BLOOD): IFOBT: NEGATIVE

## 2012-06-27 MED ORDER — CEFTRIAXONE SODIUM 1 G IJ SOLR
1.0000 g | Freq: Once | INTRAMUSCULAR | Status: AC
  Administered 2012-06-27: 1 g via INTRAMUSCULAR

## 2012-06-27 MED ORDER — CIPROFLOXACIN HCL 500 MG PO TABS: 500.0000 mg | ORAL_TABLET | Freq: Two times a day (BID) | ORAL | Status: DC

## 2012-06-27 NOTE — Progress Notes (Signed)
  Subjective:    Patient ID: [name], male    DOB: Feb 09, 1961, 52 y.o.   MRN: [id number]  HPI gout flare up a couple weeks ago on  rt knee, was drained by another doctor.Still having trouble bending knee.  Now has fever, blood in urine, chills. Bladder fills full, has urgency. Burns after urination.  Has several Dr's who he see's for multiple issues.     Review of Systems     Objective:   Physical Exam is no free CVA pain. The abdomen is soft liver spleen not enlarged. GU reveals a normal male testicles descended no hernia is felt rectal exam is normal prostate which is nontender. Results for orders placed in visit on 06/27/12  POCT CBC      Result Value Range   WBC 23.8 (*) 4.6 - 10.2 K/uL   Lymph, poc 1.8  0.6 - 3.4   POC LYMPH PERCENT 7.7 (*) 10 - 50 %L   MID (cbc) 1.3 (*) 0 - 0.9   POC MID % 5.5  0 - 12 %M   POC Granulocyte 20.7 (*) 2 - 6.9   Granulocyte percent 86.8 (*) 37 - 80 %G   RBC 5.68  4.69 - 6.13 M/uL   Hemoglobin 16.6  14.1 - 18.1 g/dL   HCT, POC 40.9  81.1 - 53.7 %   MCV 88.3  80 - 97 fL   MCH, POC 29.2  27 - 31.2 pg   MCHC 33.1  31.8 - 35.4 g/dL   RDW, POC 91.4     Platelet Count, POC 236  142 - 424 K/uL   MPV 8.9  0 - 99.8 fL  IFOBT (OCCULT BLOOD)      Result Value Range   IFOBT Negative    POCT UA - MICROSCOPIC ONLY      Result Value Range   WBC, Ur, HPF, POC tntc     RBC, urine, microscopic tntc     Bacteria, U Microscopic negativ     Mucus, UA positive     Epithelial cells, urine per micros 2-4     Crystals, Ur, HPF, POC neg     Casts, Ur, LPF, POC neg     Yeast, UA neg    POCT URINALYSIS DIPSTICK      Result Value Range   Color, UA yellow     Clarity, UA cloudy     Glucose, UA negative     Bilirubin, UA negative     Ketones, UA negative     Spec Grav, UA 1.025     Blood, UA large     pH, UA 6.5     Protein, UA >=300     Urobilinogen, UA 0.2     Nitrite, UA positive     Leukocytes, UA small (1+)         Assessment & Plan:   Patient given 1 g Rocephin. He is to be on Cipro 500 twice a day. Pertinent history reveals he has a history of cardiomyopathy V. tach has an implanted defibrillator.

## 2012-06-27 NOTE — Patient Instructions (Signed)
Urinary Tract Infection Urinary tract infections (UTIs) can develop anywhere along your urinary tract. Your urinary tract is your body's drainage system for removing wastes and extra water. Your urinary tract includes two kidneys, two ureters, a bladder, and a urethra. Your kidneys are a pair of bean-shaped organs. Each kidney is about the size of your fist. They are located below your ribs, one on each side of your spine. CAUSES Infections are caused by microbes, which are microscopic organisms, including fungi, viruses, and bacteria. These organisms are so small that they can only be seen through a microscope. Bacteria are the microbes that most commonly cause UTIs. SYMPTOMS  Symptoms of UTIs may vary by age and gender of the patient and by the location of the infection. Symptoms in young women typically include a frequent and intense urge to urinate and a painful, burning feeling in the bladder or urethra during urination. Older women and men are more likely to be tired, shaky, and weak and have muscle aches and abdominal pain. A fever may mean the infection is in your kidneys. Other symptoms of a kidney infection include pain in your back or sides below the ribs, nausea, and vomiting. DIAGNOSIS To diagnose a UTI, your caregiver will ask you about your symptoms. Your caregiver also will ask to provide a urine sample. The urine sample will be tested for bacteria and white blood cells. White blood cells are made by your body to help fight infection. TREATMENT  Typically, UTIs can be treated with medication. Because most UTIs are caused by a bacterial infection, they usually can be treated with the use of antibiotics. The choice of antibiotic and length of treatment depend on your symptoms and the type of bacteria causing your infection. HOME CARE INSTRUCTIONS  If you were prescribed antibiotics, take them exactly as your caregiver instructs you. Finish the medication even if you feel better after you  have only taken some of the medication.  Drink enough water and fluids to keep your urine clear or pale yellow.  Avoid caffeine, tea, and carbonated beverages. They tend to irritate your bladder.  Empty your bladder often. Avoid holding urine for long periods of time.  Empty your bladder before and after sexual intercourse.  After a bowel movement, women should cleanse from front to back. Use each tissue only once. SEEK MEDICAL CARE IF:   You have back pain.  You develop a fever.  Your symptoms do not begin to resolve within 3 days. SEEK IMMEDIATE MEDICAL CARE IF:   You have severe back pain or lower abdominal pain.  You develop chills.  You have nausea or vomiting.  You have continued burning or discomfort with urination. MAKE SURE YOU:   Understand these instructions.  Will watch your condition.  Will get help right away if you are not doing well or get worse. Document Released: 12/01/2004 Document Revised: 08/23/2011 Document Reviewed: 04/01/2011 Exit[employer] Patient Information 2013 [employer].  

## 2012-06-28 ENCOUNTER — Ambulatory Visit (INDEPENDENT_AMBULATORY_CARE_PROVIDER_SITE_OTHER): Payer: 59 | Admitting: Emergency Medicine

## 2012-06-28 ENCOUNTER — Encounter: Payer: Self-pay | Admitting: Family Medicine

## 2012-06-28 ENCOUNTER — Emergency Department (HOSPITAL_COMMUNITY): Payer: 59

## 2012-06-28 ENCOUNTER — Encounter (HOSPITAL_COMMUNITY): Payer: Self-pay | Admitting: Emergency Medicine

## 2012-06-28 ENCOUNTER — Inpatient Hospital Stay (HOSPITAL_COMMUNITY)
Admission: EM | Admit: 2012-06-28 | Discharge: 2012-06-30 | DRG: 690 | Disposition: A | Discharge status: Home / Self Care | Payer: 59 | Attending: Internal Medicine | Admitting: Internal Medicine

## 2012-06-28 VITALS — BP 112/74 | HR 103 | Temp 100.9°F | Resp 18 | Ht 70.0 in | Wt 256.0 lb

## 2012-06-28 DIAGNOSIS — M109 Gout, unspecified: Secondary | ICD-10-CM

## 2012-06-28 DIAGNOSIS — G4733 Obstructive sleep apnea (adult) (pediatric): Secondary | ICD-10-CM | POA: Diagnosis present

## 2012-06-28 DIAGNOSIS — R7401 Elevation of levels of liver transaminase levels: Secondary | ICD-10-CM

## 2012-06-28 DIAGNOSIS — E785 Hyperlipidemia, unspecified: Secondary | ICD-10-CM | POA: Diagnosis present

## 2012-06-28 DIAGNOSIS — A419 Sepsis, unspecified organism: Secondary | ICD-10-CM

## 2012-06-28 DIAGNOSIS — E0590 Thyrotoxicosis, unspecified without thyrotoxic crisis or storm: Secondary | ICD-10-CM

## 2012-06-28 DIAGNOSIS — I428 Other cardiomyopathies: Secondary | ICD-10-CM | POA: Diagnosis present

## 2012-06-28 DIAGNOSIS — N390 Urinary tract infection, site not specified: Principal | ICD-10-CM

## 2012-06-28 DIAGNOSIS — N183 Chronic kidney disease, stage 3 unspecified: Secondary | ICD-10-CM | POA: Diagnosis present

## 2012-06-28 DIAGNOSIS — I472 Ventricular tachycardia, unspecified: Secondary | ICD-10-CM

## 2012-06-28 DIAGNOSIS — R0602 Shortness of breath: Secondary | ICD-10-CM | POA: Diagnosis present

## 2012-06-28 DIAGNOSIS — R930 Abnormal findings on diagnostic imaging of skull and head, not elsewhere classified: Secondary | ICD-10-CM

## 2012-06-28 DIAGNOSIS — N179 Acute kidney failure, unspecified: Secondary | ICD-10-CM

## 2012-06-28 DIAGNOSIS — Z95810 Presence of automatic (implantable) cardiac defibrillator: Secondary | ICD-10-CM | POA: Diagnosis present

## 2012-06-28 DIAGNOSIS — I129 Hypertensive chronic kidney disease with stage 1 through stage 4 chronic kidney disease, or unspecified chronic kidney disease: Secondary | ICD-10-CM | POA: Diagnosis present

## 2012-06-28 DIAGNOSIS — I5020 Unspecified systolic (congestive) heart failure: Secondary | ICD-10-CM

## 2012-06-28 DIAGNOSIS — I4729 Other ventricular tachycardia: Secondary | ICD-10-CM

## 2012-06-28 DIAGNOSIS — I10 Essential (primary) hypertension: Secondary | ICD-10-CM | POA: Diagnosis present

## 2012-06-28 DIAGNOSIS — D72829 Elevated white blood cell count, unspecified: Secondary | ICD-10-CM | POA: Diagnosis present

## 2012-06-28 DIAGNOSIS — E875 Hyperkalemia: Secondary | ICD-10-CM

## 2012-06-28 DIAGNOSIS — R319 Hematuria, unspecified: Secondary | ICD-10-CM

## 2012-06-28 DIAGNOSIS — R0609 Other forms of dyspnea: Secondary | ICD-10-CM

## 2012-06-28 DIAGNOSIS — I5042 Chronic combined systolic (congestive) and diastolic (congestive) heart failure: Secondary | ICD-10-CM | POA: Diagnosis present

## 2012-06-28 DIAGNOSIS — I509 Heart failure, unspecified: Secondary | ICD-10-CM | POA: Diagnosis present

## 2012-06-28 DIAGNOSIS — I5022 Chronic systolic (congestive) heart failure: Secondary | ICD-10-CM

## 2012-06-28 DIAGNOSIS — R42 Dizziness and giddiness: Secondary | ICD-10-CM

## 2012-06-28 DIAGNOSIS — Z79899 Other long term (current) drug therapy: Secondary | ICD-10-CM

## 2012-06-28 LAB — POCT UA - MICROSCOPIC ONLY
Bacteria, U Microscopic: NEGATIVE
Crystals, Ur, HPF, POC: NEGATIVE

## 2012-06-28 LAB — COMPREHENSIVE METABOLIC PANEL
ALT: 20 U/L (ref 0–53)
AST: 19 U/L (ref 0–37)
BUN: 26 mg/dL — ABNORMAL HIGH (ref 6–23)
CO2: 21 mEq/L (ref 19–32)
Creat: 1.76 mg/dL — ABNORMAL HIGH (ref 0.50–1.35)
Total Bilirubin: 2.4 mg/dL — ABNORMAL HIGH (ref 0.3–1.2)

## 2012-06-28 LAB — BASIC METABOLIC PANEL
BUN: 26 mg/dL — ABNORMAL HIGH (ref 6–23)
CO2: 22 mEq/L (ref 19–32)
Calcium: 8.1 mg/dL — ABNORMAL LOW (ref 8.4–10.5)
Chloride: 100 mEq/L (ref 96–112)
Creatinine, Ser: 1.75 mg/dL — ABNORMAL HIGH (ref 0.50–1.35)
GFR calc Af Amer: 50 mL/min — ABNORMAL LOW (ref >90)
GFR calc non Af Amer: 43 mL/min — ABNORMAL LOW (ref >90)
Glucose, Bld: 123 mg/dL — ABNORMAL HIGH (ref 70–99)
Potassium: 3.9 mEq/L (ref 3.5–5.1)
Sodium: 133 mEq/L — ABNORMAL LOW (ref 135–145)

## 2012-06-28 LAB — URINALYSIS, ROUTINE W REFLEX MICROSCOPIC
Ketones, ur: NEGATIVE mg/dL
Protein, ur: 100 mg/dL — AB
Urobilinogen, UA: 1 mg/dL (ref 0.0–1.0)

## 2012-06-28 LAB — POCT URINALYSIS DIPSTICK
Glucose, UA: NEGATIVE
Spec Grav, UA: >=1.03

## 2012-06-28 LAB — POCT CBC
Lymph, poc: 1.9 (ref 0.6–3.4)
MCH, POC: 29.3 pg (ref 27–31.2)
MCHC: 32.7 g/dL (ref 31.8–35.4)
MID (cbc): 1.3 — AB (ref 0–0.9)
MPV: 9.4 fL (ref 0–99.8)
POC MID %: 4 %M (ref 0–12)
Platelet Count, POC: 178 10*3/uL (ref 142–424)
WBC: 31.4 10*3/uL — AB (ref 4.6–10.2)

## 2012-06-28 LAB — LACTIC ACID, PLASMA: Lactic Acid, Venous: 1.3 mmol/L (ref 0.5–2.2)

## 2012-06-28 LAB — PSA: PSA: 25.03 ng/mL — ABNORMAL HIGH (ref <=4.00)

## 2012-06-28 LAB — URINE MICROSCOPIC-ADD ON

## 2012-06-28 LAB — HEMOGLOBIN A1C: Hgb A1c MFr Bld: 6.3 % — ABNORMAL HIGH (ref <5.7)

## 2012-06-28 MED ORDER — VANCOMYCIN HCL 10 G IV SOLR
2000.0000 mg | Freq: Once | INTRAVENOUS | Status: AC
  Administered 2012-06-28: 2000 mg via INTRAVENOUS
  Filled 2012-06-28: qty 2000

## 2012-06-28 MED ORDER — DIGOXIN 125 MCG PO TABS
0.1250 mg | ORAL_TABLET | Freq: Every day | ORAL | Status: DC
  Administered 2012-06-29 – 2012-06-30 (×2): 0.125 mg via ORAL
  Filled 2012-06-28 (×2): qty 1

## 2012-06-28 MED ORDER — CARVEDILOL 12.5 MG PO TABS
12.5000 mg | ORAL_TABLET | Freq: Every day | ORAL | Status: DC
  Administered 2012-06-29 – 2012-06-30 (×2): 12.5 mg via ORAL
  Filled 2012-06-28 (×3): qty 1

## 2012-06-28 MED ORDER — CARVEDILOL 6.25 MG PO TABS
18.7500 mg | ORAL_TABLET | Freq: Every day | ORAL | Status: DC
  Administered 2012-06-28 – 2012-06-29 (×2): 18.75 mg via ORAL
  Filled 2012-06-28 (×3): qty 1

## 2012-06-28 MED ORDER — HEPARIN SODIUM (PORCINE) 5000 UNIT/ML IJ SOLN
5000.0000 [IU] | Freq: Three times a day (TID) | INTRAMUSCULAR | Status: DC
  Administered 2012-06-28: 5000 [IU] via SUBCUTANEOUS
  Filled 2012-06-28 (×3): qty 1

## 2012-06-28 MED ORDER — SODIUM CHLORIDE 0.9 % IV BOLUS (SEPSIS)
1000.0000 mL | Freq: Once | INTRAVENOUS | Status: AC
  Administered 2012-06-28: 1000 mL via INTRAVENOUS

## 2012-06-28 MED ORDER — IOHEXOL 300 MG/ML  SOLN
80.0000 mL | Freq: Once | INTRAMUSCULAR | Status: AC | PRN
  Administered 2012-06-28: 80 mL via INTRAVENOUS

## 2012-06-28 MED ORDER — COLCHICINE 0.6 MG PO TABS
0.6000 mg | ORAL_TABLET | Freq: Two times a day (BID) | ORAL | Status: DC | PRN
Start: 2012-06-28 — End: 2012-06-30
  Filled 2012-06-28: qty 1

## 2012-06-28 MED ORDER — ATORVASTATIN CALCIUM 40 MG PO TABS
40.0000 mg | ORAL_TABLET | Freq: Every day | ORAL | Status: DC
  Administered 2012-06-28 – 2012-06-29 (×2): 40 mg via ORAL
  Filled 2012-06-28 (×3): qty 1

## 2012-06-28 MED ORDER — IOHEXOL 300 MG/ML  SOLN
50.0000 mL | Freq: Once | INTRAMUSCULAR | Status: AC | PRN
  Administered 2012-06-28: 50 mL via ORAL

## 2012-06-28 MED ORDER — ONDANSETRON HCL 4 MG/2ML IJ SOLN: 4.0000 mg | Freq: Three times a day (TID) | INTRAMUSCULAR | Status: AC | PRN

## 2012-06-28 MED ORDER — SODIUM CHLORIDE 0.9 % IJ SOLN: 3.0000 mL | Freq: Two times a day (BID) | INTRAMUSCULAR | Status: DC

## 2012-06-28 MED ORDER — DEXTROSE 5 % IV SOLN
1.0000 g | Freq: Once | INTRAVENOUS | Status: AC
  Administered 2012-06-28: 1 g via INTRAVENOUS
  Filled 2012-06-28: qty 10

## 2012-06-28 MED ORDER — PIPERACILLIN-TAZOBACTAM 3.375 G IVPB
3.3750 g | Freq: Three times a day (TID) | INTRAVENOUS | Status: DC
  Administered 2012-06-28 – 2012-06-30 (×6): 3.375 g via INTRAVENOUS
  Filled 2012-06-28 (×8): qty 50

## 2012-06-28 MED ORDER — CEFTRIAXONE SODIUM 1 G IJ SOLR: 1.0000 g | Freq: Once | INTRAMUSCULAR | Status: DC

## 2012-06-28 MED ORDER — VANCOMYCIN HCL 10 G IV SOLR: 1500.0000 mg | INTRAVENOUS | Status: DC

## 2012-06-28 NOTE — H&P (Signed)
[facility] History and Physical  [name] WUX:[id number] DOB: 07-24-1960 DOA: 06/28/2012  Referring physician: Dr. Juleen China PCP: Mickie Hillier, MD  Specialists: Urology, Dr. Brunilda Payor  Chief Complaint: fever and burning with urination  HPI: [name] is a 52 y.o. male has a past medical history significant for chronic systolic HF (EF 15%, followed by Dr. Gala Romney), ICD/Pacemaker followed by Dr. Graciela Husbands, CKD follwoed by Dr. Retta Mac (baseline Cr ~1.2 - 1.5), HTN, HLD, presented to his PCP (Dr. Cleta Alberts) with a CC of blood in his urine, burning with urination worsening for the past week. He saw his PCP yesterday. His WBC was elevated at 23.8 with positive UA and received one dose of ceftriaxone and ciprofloxacin po to take home. Presented today for follow up being persistently febrile, although patient states that he feels better. WBC in the ED showed worsening leukocytosis with WBC 31.4. Hospitalist service has been asked to admit for further evaluation.   Reports that he noticed no fluid overload and his exercise level is unchanged. No CP or dyspnea with exertion, no orthopnea. He reported flank pain for Dr. Cleta Alberts today however denies that for me. Had a gout flare-up 2 weeks ago s/p right knee aspiration. Knee feels better but not back to normal. This is somewhat unusual, given the fact that every time he has his knee aspirated (about twice a year) he is usually back to normal in 2-3 days. He feels like his knee is less swollen and not as red and warm as it was, but not back to normal.  Review of Systems: The patient denies anorexia, weight loss, vision loss, decreased hearing, hoarseness, chest pain, syncope, dyspnea on exertion, peripheral edema, balance deficits, hemoptysis, abdominal pain, melena, hematochezia, severe indigestion/heartburn, hematuria, incontinence, genital sores.  Past Medical History  Diagnosis Date  . Gout   . Cardiomyopathy     Idiopathic dilated;   Marland Kitchen  Ventricular tachycardia     s/p ICD  . HTN (hypertension)   . Sleep apnea     CPAP  . Systolic CHF     EF 15%  . CKD (chronic kidney disease)     stage III baseline Crt 1.8-2.0  . Biventricular ICD (implantable cardiac defibrillator) Medtronic ]     DOI 2008/ upgrade 2010/ Gen Change 2014   Past Surgical History  Procedure Laterality Date  . Cholecystectomy    . Cardiac catheterization      he was found to have normal coronary arteries but with a globally dilated and hypocontractile heart  . US echocardiography  03-17-2008, 07-03-2006    EF 15-20%, EF 15-20%   Social History:  reports that he has never smoked. He has never used smokeless tobacco. He reports that he does not drink alcohol or use illicit drugs.  No Known Allergies  History reviewed. No pertinent family history.  Prior to Admission medications   Medication Sig Start Date End Date Taking? Authorizing Provider  allopurinol (ZYLOPRIM) 300 MG tablet Take 300 mg by mouth daily.   Yes Historical Provider, MD  atorvastatin (LIPITOR) 40 MG tablet Take 1 tablet (40 mg total) by mouth daily. 04/03/12  Yes Vesta Mixer, MD  carvedilol (COREG) 12.5 MG tablet Take 12.5-18.75 mg by mouth 2 (two) times daily with a meal. 1 tab in am, 1.5 tabs in pm   Yes Historical Provider, MD  ciprofloxacin (CIPRO) 500 MG tablet Take 500 mg by mouth 2 (two) times daily. 5 day course of therapy started 06/27/12.   Yes  Historical Provider, MD  colchicine 0.6 MG tablet Take 1 tablet (0.6 mg total) by mouth 2 (two) times daily as needed (gout). 01/29/12  Yes Jessica A Hope, PA-C  digoxin (LANOXIN) 0.25 MG tablet Take 0.125 mg by mouth daily.  04/11/11  Yes Duke Salvia, MD  furosemide (LASIX) 40 MG tablet Take 40 mg by mouth every Monday, Wednesday, and Friday.   Yes Historical Provider, MD  olmesartan (BENICAR) 20 MG tablet Take 1 tablet (20 mg total) by mouth daily. 01/29/12  Yes Jessica A Hope, PA-C   Physical Exam: Filed Vitals:   06/28/12 1353   BP: 120/69  Pulse: 106  Temp: 99.7 F (37.6 C)  TempSrc: Oral  Resp: 18  SpO2: 97%     General:  NAD, pleasant AA male  Eyes: PERRL, EOMI; R eye erythematous (reports chronic)  ENT: moist oropharynx  Neck: supple, no JVD  Cardiovascular: RRR without MRG; ICD/Pacer in place   Respiratory: CTA biL, good air movement without wheezing, rhonchi or crackled  Abdomen: soft, non tender to palpation, positive bowel sounds, no guarding, no rebound  Skin: no rashes  Musculoskeletal: no peripheral edema; no CVA tenderness; right knee without apparent effusions, nontender to palpation, not swollen, mildly warmer than left knee.   Psychiatric: normal mood and affect  Neurologic: CN 2-12 grossly intact, MS 5/5 in all 4  Labs on Admission:  Basic Metabolic Panel:  Recent Labs Lab 06/28/12 1445  NA 133*  K 3.9  CL 100  CO2 22  GLUCOSE 123*  BUN 26*  CREATININE 1.75*  CALCIUM 8.1*   CBC:  Recent Labs Lab 06/27/12 1418 06/28/12 1259  WBC 23.8* 31.4*  HGB 16.6 16.8  HCT 50.2 51.4  MCV 88.3 89.6   BNP (last 3 results)  Recent Labs  01/25/12 1817  PROBNP 332.9*   EKG: Independently reviewed.  Assessment/Plan Active Problems:   OBSTRUCTIVE SLEEP APNEA   Essential hypertension, benign   CARDIOMYOPATHY   SYSTOLIC HEART FAILURE, CHRONIC   DYSPNEA   Biventricular ICD-Medtronic   Chronic combined systolic and diastolic CHF (congestive heart failure)  Urinary tract infection - with persistent symptoms  - Ceftriaxone x 1 in the ED - questionable prostatitis, benign exam per EDP - underwent CT scan pelvis which showed   "1. Suspect 3 cm mass in the base of the bladder. Recommend urologic consultation and cystoscopic evaluation. 2. No renal or ureteral lesions. 3. No abdominal or pelvic lymphadenopathy."  - I spoke with Dr. Brunilda Payor from Urology and have asked for evaluation, appreciate their input.  - received Ceftriaxone yesterday yet white count worse, will  change antibiotic coverage to Vancomycin and Zosyn. Will ask for a urine gram stain, low suspicion for gram positives and Vancomycin can be d/c when stain is back.   Combined systolic and diastolic heart failure - 1L NS given in ED in preparation for CT - normotensive - will hold maintenance fluids - continue Coreg  Recent R knee aspiration - very benign knee exam, but little threshold to further image if gets worse  Pacemaker/ICD - on telemetry  Acute on chronic renal failure - 1L fluids in the ED - holding ARB for now  HTN - Coreg, holding ARB as above  HLD - continue statin  DVT Prophylaxis - heparin s.q. held due to hematuria, no significant bleed given Hb of 16.8 - SCDs for now   Code Status: Full  Family Communication: none  Disposition Plan: inpatient  Time spent: 41  Charissa Knowles M.  Elvera Lennox, MD [facility] Pager [phone]  If 7PM-7AM, please contact night-coverage [email or web address] Password [facility] 06/28/2012, 5:11 PM

## 2012-06-28 NOTE — ED Notes (Signed)
[id number]<AV> Expected date:<BR> Expected time:<BR> Means of arrival:Ambulance<BR> Comments:<BR> Fever/Elevated WBC

## 2012-06-28 NOTE — Consult Note (Signed)
Urology Consult  Referring physician: Dr Pamella Pert Reason for referral: Gross hematuria, bladder mass on CT scan  Chief Complaint: Gross hematuria  History of Present Illness: Patient is a 52 years old male who presented to the emergency room today with a one-week history of gross hematuria associated with slight dysuria. He was seen by his primary care physician yesterday. His white count was found to be elevated at 23.8. He was given Rocephin one dose and was sent home on Cipro.however he continued to have a fever. His white count went up to 31.4. A CT scan of the abdomen and pelvis with IV contrast revealed normal kidneys and a 3 cm mass at the base of the bladder. The patient reports that he never smoked. He has a significant history CHF, chronic kidney disease, hypertension. Urinalysis shows  too numerous to count WBCs, too numerous to count RBCs, few bacteria. He voids with a good flow. He denies straining on urination.  Past Medical History  Diagnosis Date  . Gout   . Cardiomyopathy     Idiopathic dilated;   Marland Kitchen Ventricular tachycardia     s/p ICD  . HTN (hypertension)   . Sleep apnea     CPAP  . Systolic CHF     EF 15%  . CKD (chronic kidney disease)     stage III baseline Crt 1.8-2.0  . Biventricular ICD (implantable cardiac defibrillator) Medtronic ]     DOI 2008/ upgrade 2010/ Gen Change 2014   Past Surgical History  Procedure Laterality Date  . Cholecystectomy    . Cardiac catheterization      he was found to have normal coronary arteries but with a globally dilated and hypocontractile heart  . US echocardiography  03-17-2008, 07-03-2006    EF 15-20%, EF 15-20%    Medications: Allopurinol, Lipitor, Coreg, Cipro, colchicine, digoxin, Lasix, Benicar. Allergies: No Known Allergies  History reviewed. No pertinent family history. Social History:  reports that he has never smoked. He has never used smokeless tobacco. He reports that he does not drink alcohol or use  illicit drugs.  ROS: All systems are reviewed and negative except as noted.   Physical Exam:  Vital signs in last 24 hours: Temp:  [99.7 F (37.6 C)-101.3 F (38.5 C)] 101.3 F (38.5 C) (04/24 1825) Pulse Rate:  [98-109] 109 (04/24 1825) Resp:  [18] 18 (04/24 1825) BP: (98-138)/(67-78) 138/78 mmHg (04/24 1825) SpO2:  [97 %-99 %] 99 % (04/24 1825) Weight:  [116 kg (255 lb 11.7 oz)-116.121 kg (256 lb)] 116 kg (255 lb 11.7 oz) (04/24 1825) HEENT: Normal Cardiovascular: Skin warm; not flushed Respiratory: Breaths quiet; no shortness of breath Abdomen: No masses Neurological: Normal sensation to touch Musculoskeletal: Normal motor function arms and legs Lymphatics: No inguinal adenopathy Skin: No rashes Genitourinary:Penis: circumcised.  Meatus is normal Scrotum is normal in appearance.  There is no hydrocele, no testicular mass. Rectal: Sphincter tone: normal.  Prostate is enlarged 40 gm.  No nodules.  Seminal vesicles are not palpable. Laboratory Data:  Results for orders placed during the hospital encounter of 06/28/12 (from the past 72 hour(s))  URINALYSIS, ROUTINE W REFLEX MICROSCOPIC     Status: Abnormal   Collection Time    06/28/12  2:14 PM      Result Value Range   Color, Urine ORANGE (*) YELLOW   Comment: BIOCHEMICALS MAY BE AFFECTED BY COLOR   APPearance CLOUDY (*) CLEAR   Specific Gravity, Urine 1.026  1.005 - 1.030  pH 5.0  5.0 - 8.0   Glucose, UA NEGATIVE  NEGATIVE mg/dL   Hgb urine dipstick LARGE (*) NEGATIVE   Bilirubin Urine SMALL (*) NEGATIVE   Ketones, ur NEGATIVE  NEGATIVE mg/dL   Protein, ur 295 (*) NEGATIVE mg/dL   Urobilinogen, UA 1.0  0.0 - 1.0 mg/dL   Nitrite POSITIVE (*) NEGATIVE   Leukocytes, UA MODERATE (*) NEGATIVE  URINE MICROSCOPIC-ADD ON     Status: Abnormal   Collection Time    06/28/12  2:14 PM      Result Value Range   Squamous Epithelial / LPF RARE  RARE   WBC, UA TOO NUMEROUS TO COUNT  <3 WBC/hpf   RBC / HPF TOO NUMEROUS TO COUNT   <3 RBC/hpf   Bacteria, UA FEW (*) RARE   Urine-Other AMORPHOUS URATES/PHOSPHATES    BASIC METABOLIC PANEL     Status: Abnormal   Collection Time    06/28/12  2:45 PM      Result Value Range   Sodium 133 (*) 135 - 145 mEq/L   Potassium 3.9  3.5 - 5.1 mEq/L   Chloride 100  96 - 112 mEq/L   CO2 22  19 - 32 mEq/L   Glucose, Bld 123 (*) 70 - 99 mg/dL   BUN 26 (*) 6 - 23 mg/dL   Creatinine, Ser 6.21 (*) 0.50 - 1.35 mg/dL   Calcium 8.1 (*) 8.4 - 10.5 mg/dL   GFR calc non Af Amer 43 (*) >90 mL/min   GFR calc Af Amer 50 (*) >90 mL/min   Comment:            The eGFR has been calculated     using the CKD EPI equation.     This calculation has not been     validated in all clinical     situations.     eGFR's persistently     <90 mL/min signify     possible Chronic Kidney Disease.  LACTIC ACID, PLASMA     Status: None   Collection Time    06/28/12  4:15 PM      Result Value Range   Lactic Acid, Venous 1.3  0.5 - 2.2 mmol/L   No results found for this or any previous visit (from the past 240 hour(s)). Creatinine:  Recent Labs  06/28/12 1445  CREATININE 1.75*    Xrays: See report/chart I independently reviewed the CT scan and the findings are as noted above.  Impression/Assessment:  Gross hematuria.  Bladder mass.  Rule out bladder tumor. Renal insufficiency. History of CHF and hypertension.  Plan: Urine culture and sensitivity. Cystoscopy when medically stable.  [name] 06/28/2012, 7:20 PM   CC: Dr Pamella Pert

## 2012-06-28 NOTE — ED Provider Notes (Signed)
History    52yM with fever and urinary symptoms. Has past medical histor of gout, nonischemic cardiomyopathy (EF 15%) S/P CRT-D implantation 2010 and OSA. Interestingly previous cardiologyy noted mention prostatitis. Pt reports previous seen by Dr [name] "for my prostate," but unable to provide more specifics. Recent sysymptoms of fever, chills, urinary urgency, hematuria and dysuria. Seen by PCP yesterday for same. UA consistent with UTI. Received dose of ceftriaxone and script for cipro. 3 doses of cipro before returning to PCP for re-evaluation. Urinary symptoms somewhat improved, but not resolved. Persistent fever. Concern that not more significant improvement at this time and referred to ED for further evaluation.  CSN: [id number]  Arrival date & time 06/28/12  1347   First MD Initiated Contact with Patient 06/28/12 1357      Chief Complaint  Patient presents with  . Urinary Tract Infection    (Consider location/radiation/quality/duration/timing/severity/associated sxs/prior treatment) HPI  Past Medical History  Diagnosis Date  . Gout   . Cardiomyopathy     Idiopathic dilated;   Marland Kitchen Ventricular tachycardia     s/p ICD  . HTN (hypertension)   . Sleep apnea     CPAP  . Systolic CHF     EF 15%  . CKD (chronic kidney disease)     stage III baseline Crt 1.8-2.0  . Biventricular ICD (implantable cardiac defibrillator) [employer]tronic ]     DOI 2008/ upgrade 2010/ Gen Change 2014    Past Surgical History  Procedure Laterality Date  . Cholecystectomy    . Cardiac catheterization      he was found to have normal coronary arteries but with a globally dilated and hypocontractile heart  . US echocardiography  03-17-2008, 07-03-2006    EF 15-20%, EF 15-20%    No family history on file.  History  Substance Use Topics  . Smoking status: Never Smoker   . Smokeless tobacco: Not on file  . Alcohol Use: No      Review of Systems  All systems reviewed and negative, other than as  noted in HPI.   Allergies  Review of patient's allergies indicates no known allergies.  Home Medications   Current Outpatient Rx  Name  Route  Sig  Dispense  Refill  . allopurinol (ZYLOPRIM) 300 MG tablet   Oral   Take 300 mg by mouth daily.         Marland Kitchen atorvastatin (LIPITOR) 40 MG tablet   Oral   Take 1 tablet (40 mg total) by mouth daily.   90 tablet   1   . carvedilol (COREG) 12.5 MG tablet   Oral   Take 12.5-18.75 mg by mouth 2 (two) times daily with a meal. 1 tab in am, 1.5 tabs in pm         . ciprofloxacin (CIPRO) 500 MG tablet   Oral   Take 500 mg by mouth 2 (two) times daily. 5 day course of therapy started 06/27/12.         Marland Kitchen colchicine 0.6 MG tablet   Oral   Take 1 tablet (0.6 mg total) by mouth 2 (two) times daily as needed (gout).   10 tablet   0   . digoxin (LANOXIN) 0.25 MG tablet   Oral   Take 0.125 mg by mouth daily.          . furosemide (LASIX) 40 MG tablet   Oral   Take 40 mg by mouth every Monday, Wednesday, and Friday.         Marland Kitchen  olmesartan (BENICAR) 20 MG tablet   Oral   Take 1 tablet (20 mg total) by mouth daily.   30 tablet   3     BP 120/69  Pulse 106  Temp(Src) 99.7 F (37.6 C) (Oral)  Resp 18  SpO2 97%  Physical Exam  Nursing note and vitals reviewed. Constitutional: He appears well-developed and well-nourished. No distress.  HENT:  Head: Normocephalic and atraumatic.  Eyes: Right eye exhibits no discharge. Left eye exhibits no discharge.  R eye conjunctivitis  Neck: Neck supple.  Cardiovascular: Normal rate, regular rhythm and normal heart sounds.  Exam reveals no gallop and no friction rub.   No murmur heard. Pulmonary/Chest: Effort normal and breath sounds normal. No respiratory distress.  Abdominal: Soft. He exhibits no distension. There is no tenderness.  Genitourinary:  No cva tenderness. DRE unremakable. Prostate did not seem significanlyt enlarged. No tenderness or fluctuance. No concerning mass.    Musculoskeletal: He exhibits no edema and no tenderness.  Neurological: He is alert.  Skin: Skin is warm and dry.  Psychiatric: He has a normal mood and affect. His behavior is normal. Thought content normal.    ED Course  Procedures (including critical care time)  Labs Reviewed  URINALYSIS, ROUTINE W REFLEX MICROSCOPIC - Abnormal; Notable for the following:    Color, Urine ORANGE (*)    APPearance CLOUDY (*)    Hgb urine dipstick LARGE (*)    Bilirubin Urine SMALL (*)    Protein, ur 100 (*)    Nitrite POSITIVE (*)    Leukocytes, UA MODERATE (*)    All other components within normal limits  BASIC METABOLIC PANEL - Abnormal; Notable for the following:    Sodium 133 (*)    Glucose, Bld 123 (*)    BUN 26 (*)    Creatinine, Ser 1.75 (*)    Calcium 8.1 (*)    GFR calc non Af Amer 43 (*)    GFR calc Af Amer 50 (*)    All other components within normal limits  URINE MICROSCOPIC-ADD ON - Abnormal; Notable for the following:    Bacteria, UA FEW (*)    All other components within normal limits  URINE CULTURE   No results found.   1. Sepsis   2. UTI (lower urinary tract infection)       MDM  52yM with persistent fever, increasing leukocytosis and urinary symptoms despite outpt therapy. Urine cultures ordered by Dr Cleta Alberts are still pending. Pt previously seen by Dr [name], urology. Per recent PCP note, mention of CT to eval for possible abscess given continued symptoms despite abx. Unfortunately Cr 1.7.  Noncontrast study of less utility. Pt without abdominal pain, benign abdominal exam and digital rectal exam which would go against an intraabdominal/pelvic abscess.        Raeford Razor, MD 06/28/12 [phone]

## 2012-06-28 NOTE — ED Notes (Signed)
Per EMS: yesterday had fever with WBC of 20,000 something and now today WBC 31.3, dr states urine was all pus today. 20 g right AC.

## 2012-06-28 NOTE — Progress Notes (Signed)
Subjective:    Patient ID: [name], male    DOB: 1960-04-29, 52 y.o.   MRN: [id number]  HPI Here for recheck from yesterday. Reports he is feeling better than yesterday. He has been trying to drink fluids, but does not enjoy drinking water. Has only had a small water today. Filled antibiotic yesterday. Took 2 doses yesterday, and one dose today. Still febrile.    Review of Systems  Constitutional: Positive for fever.  Genitourinary: Positive for flank pain.       Objective:   Physical Exam  Results for orders placed in visit on 06/28/12  POCT UA - MICROSCOPIC ONLY      Result Value Range   WBC, Ur, HPF, POC tntc     RBC, urine, microscopic tntc     Bacteria, U Microscopic neg     Mucus, UA neg     Epithelial cells, urine per micros neg     Crystals, Ur, HPF, POC neg     Casts, Ur, LPF, POC neg     Yeast, UA neg    POCT URINALYSIS DIPSTICK      Result Value Range   Color, UA orange     Clarity, UA clear     Glucose, UA neg     Bilirubin, UA small     Ketones, UA trace     Spec Grav, UA >=1.030     Blood, UA large     pH, UA 5.5     Protein, UA >300     Urobilinogen, UA 0.2     Nitrite, UA positive     Leukocytes, UA small (1+)     The patient is alert and cooperative he is nondiaphoretic. His neck is supple. His chest is clear. There is no CVA pain his abdomen is soft and nontender. Results for orders placed in visit on 06/28/12  POCT UA - MICROSCOPIC ONLY      Result Value Range   WBC, Ur, HPF, POC tntc     RBC, urine, microscopic tntc     Bacteria, U Microscopic neg     Mucus, UA neg     Epithelial cells, urine per micros neg     Crystals, Ur, HPF, POC neg     Casts, Ur, LPF, POC neg     Yeast, UA neg    POCT URINALYSIS DIPSTICK      Result Value Range   Color, UA orange     Clarity, UA clear     Glucose, UA neg     Bilirubin, UA small     Ketones, UA trace     Spec Grav, UA >=1.030     Blood, UA large     pH, UA 5.5     Protein, UA >300     Urobilinogen, UA 0.2     Nitrite, UA positive     Leukocytes, UA small (1+)    POCT CBC      Result Value Range   WBC 31.4 (*) 4.6 - 10.2 K/uL   Lymph, poc 1.9  0.6 - 3.4   POC LYMPH PERCENT 6.1 (*) 10 - 50 %L   MID (cbc) 1.3 (*) 0 - 0.9   POC MID % 4.0  0 - 12 %M   POC Granulocyte 28.2 (*) 2 - 6.9   Granulocyte percent 89.9 (*) 37 - 80 %G   RBC 5.74  4.69 - 6.13 M/uL   Hemoglobin 16.8  14.1 - 18.1 g/dL  HCT, POC 51.4  43.5 - 53.7 %   MCV 89.6  80 - 97 fL   MCH, POC 29.3  27 - 31.2 pg   MCHC 32.7  31.8 - 35.4 g/dL   RDW, POC 04.5     Platelet Count, POC 178  142 - 424 K/uL   MPV 9.4  0 - 99.8 fL       Assessment & Plan:  Pressures on a low and that he is on multiple blood pressure medications. We'll go ahead and give a liter of IV fluids check a CBC  And Cmet. His urine still is significantly abnormal however he does not have any burning any more. He is had no shaking chills or sweats but he did feel cold last night. His PSA was elevated at 25 and will need to be repeated in 3-4 weeks. Patient will be admitted for broad-spectrum IV antibiotics we'll see if we can admit him to a monitored unit because of his history of cardiomyopathy and him having an implanted defibrillator. Dr.Ottelin was called. He suggested that considering a CT of the pelvis to rule out prostate or pelvic abscess since the white count is so high. And he has not responded to Rocephin and Cipro.

## 2012-06-28 NOTE — Progress Notes (Signed)
ANTIBIOTIC CONSULT NOTE - INITIAL  Pharmacy Consult for Vancomycin/Zosyn Indication: UTI  No Known Allergies  Patient Measurements:     Vital Signs: Temp: 99.7 F (37.6 C) (04/24 1353) Temp src: Oral (04/24 1353) BP: 120/69 mmHg (04/24 1353) Pulse Rate: 106 (04/24 1353) Intake/Output from previous day:   Intake/Output from this shift:    Labs:  Recent Labs  06/27/12 1418 06/28/12 1259 06/28/12 1445  WBC 23.8* 31.4*  --   HGB 16.6 16.8  --   CREATININE  --   --  1.75*   The CrCl is unknown because both a height and weight (above a minimum accepted value) are required for this calculation. No results found for this basename: VANCOTROUGH, VANCOPEAK, VANCORANDOM, GENTTROUGH, GENTPEAK, GENTRANDOM, TOBRATROUGH, TOBRAPEAK, TOBRARND, AMIKACINPEAK, AMIKACINTROU, AMIKACIN,  in the last 72 hours   Microbiology: No results found for this or any previous visit (from the past 720 hour(s)).  Medical History: Past Medical History  Diagnosis Date  . Gout   . Cardiomyopathy     Idiopathic dilated;   Marland Kitchen Ventricular tachycardia     s/p ICD  . HTN (hypertension)   . Sleep apnea     CPAP  . Systolic CHF     EF 15%  . CKD (chronic kidney disease)     stage III baseline Crt 1.8-2.0  . Biventricular ICD (implantable cardiac defibrillator) Medtronic ]     DOI 2008/ upgrade 2010/ Gen Change 2014    Medications:  Scheduled:  . cefTRIAXone (ROCEPHIN) IM  1 g Intramuscular Once  . sodium chloride  3 mL Intravenous Q12H  . [DISCONTINUED] heparin  5,000 Units Subcutaneous Q8H   Infusions:  . [COMPLETED] cefTRIAXone (ROCEPHIN)  IV Stopped (06/28/12 1548)  . [COMPLETED] sodium chloride 1,000 mL (06/28/12 1449)   PRN: [COMPLETED] iohexol, [COMPLETED] iohexol Assessment:  52 yo M with suspected UTI.  Patient presents to ER with persistent fever and worsening leukocytosis despite starting ciprofloxacin on 4/23 and receiving a dose of IM rocephin on 4/23.  Start Vancomycin and Zosyn  per pharmacy   Scr elevated 1.75, CrCl 75 ml/min, normalized 50 ml/min   Blood and urine cultures pending   Goal of Therapy:  Vancomycin trough level 15-20 mcg/ml Zosyn per renal function   Plan:   Vancomycin 2 grams x 1 now, then start 1500 mg IV q24h  Zosyn 3.375 grams IV q8h, infuse over 4 hours  Monitor renal function   Check vancomycin trough as needed  F/u cultures    Gianella Chismar, Loma Messing PharmD Pager #: [phone] 5:34 PM 06/28/2012

## 2012-06-28 NOTE — Progress Notes (Signed)
Patient refuses nocturnal CPAP at this time. He states he does use a machine at home, but does not use it every night. VSS on room air. He is encouraged to let RT or RN know if he needs further assistance or wishes to indeed use positive pressure later tonight.

## 2012-06-29 ENCOUNTER — Inpatient Hospital Stay (HOSPITAL_COMMUNITY): Payer: 59

## 2012-06-29 DIAGNOSIS — A419 Sepsis, unspecified organism: Secondary | ICD-10-CM

## 2012-06-29 DIAGNOSIS — I5022 Chronic systolic (congestive) heart failure: Secondary | ICD-10-CM

## 2012-06-29 DIAGNOSIS — N179 Acute kidney failure, unspecified: Secondary | ICD-10-CM

## 2012-06-29 DIAGNOSIS — I5042 Chronic combined systolic (congestive) and diastolic (congestive) heart failure: Secondary | ICD-10-CM

## 2012-06-29 DIAGNOSIS — I509 Heart failure, unspecified: Secondary | ICD-10-CM

## 2012-06-29 LAB — URIC ACID: Uric Acid, Serum: 9.3 mg/dL — ABNORMAL HIGH (ref 4.0–7.8)

## 2012-06-29 LAB — URINE CULTURE
Colony Count: 50000
Colony Count: NO GROWTH
Culture: NO GROWTH

## 2012-06-29 LAB — CBC WITH DIFFERENTIAL/PLATELET
Basophils Absolute: 0 10*3/uL (ref 0.0–0.1)
Lymphocytes Relative: 8 % — ABNORMAL LOW (ref 12–46)
Lymphs Abs: 2.1 10*3/uL (ref 0.7–4.0)
MCV: 83 fL (ref 78.0–100.0)
Neutro Abs: 23.4 10*3/uL — ABNORMAL HIGH (ref 1.7–7.7)
Neutrophils Relative %: 83 % — ABNORMAL HIGH (ref 43–77)
Platelets: 145 10*3/uL — ABNORMAL LOW (ref 150–400)
RBC: 5.23 MIL/uL (ref 4.22–5.81)
RDW: 15 % (ref 11.5–15.5)
WBC: 28.2 10*3/uL — ABNORMAL HIGH (ref 4.0–10.5)

## 2012-06-29 LAB — COMPREHENSIVE METABOLIC PANEL
Albumin: 2.9 g/dL — ABNORMAL LOW (ref 3.5–5.2)
Alkaline Phosphatase: 70 U/L (ref 39–117)
BUN: 24 mg/dL — ABNORMAL HIGH (ref 6–23)
Chloride: 101 mEq/L (ref 96–112)
Creatinine, Ser: 1.69 mg/dL — ABNORMAL HIGH (ref 0.50–1.35)
GFR calc Af Amer: 52 mL/min — ABNORMAL LOW (ref >90)
Glucose, Bld: 113 mg/dL — ABNORMAL HIGH (ref 70–99)
Potassium: 3.9 mEq/L (ref 3.5–5.1)
Total Bilirubin: 2 mg/dL — ABNORMAL HIGH (ref 0.3–1.2)

## 2012-06-29 MED ORDER — OLOPATADINE HCL 0.1 % OP SOLN
2.0000 [drp] | Freq: Two times a day (BID) | OPHTHALMIC | Status: DC
  Administered 2012-06-29 – 2012-06-30 (×3): 2 [drp] via OPHTHALMIC
  Filled 2012-06-29: qty 5

## 2012-06-29 MED ORDER — LISINOPRIL 5 MG PO TABS
5.0000 mg | ORAL_TABLET | Freq: Every day | ORAL | Status: DC
  Administered 2012-06-29 – 2012-06-30 (×2): 5 mg via ORAL
  Filled 2012-06-29 (×2): qty 1

## 2012-06-29 MED ORDER — MAGNESIUM SULFATE IN D5W 10-5 MG/ML-% IV SOLN
1.0000 g | Freq: Once | INTRAVENOUS | Status: AC
  Administered 2012-06-29: 1 g via INTRAVENOUS
  Filled 2012-06-29: qty 100

## 2012-06-29 NOTE — Progress Notes (Signed)
   CARE MANAGEMENT NOTE 06/29/2012  Patient:  [name], [name]   Account Number:  [id number]  Date Initiated:  06/29/2012  Documentation initiated by:  Jiles Crocker  Subjective/Objective Assessment:   ADMITTED WITH UTI, BLADDER MASS     Action/Plan:   PCP: Mickie Hillier, MD  Specialists: Urology, Dr. Pilar Plate AT HOME WITH SPOUSE; CM FOLLOWING FOR DCP   Anticipated DC Date:  07/01/2012   Anticipated DC Plan:  HOME/SELF CARE      DC Planning Services  CM consult       Status of service:  In process, will continue to follow Medicare Important Message given?  NA - LOS <3 / Initial given by admissions (If response is "NO", the following Medicare IM given date fields will be blank) Per UR Regulation:  Reviewed for med. necessity/level of care/duration of stay Comments:  06/29/2012- B Humzah Harty RN,BSN,MHA

## 2012-06-29 NOTE — Progress Notes (Signed)
Subjective: Patient reports No gross hematuria today.  Voids well.  Objective: Vital signs in last 24 hours: Temp:  [98.5 F (36.9 C)-101.3 F (38.5 C)] 98.5 F (36.9 C) (04/25 0545) Pulse Rate:  [87-109] 88 (04/25 1106) Resp:  [18] 18 (04/25 0545) BP: (98-138)/(62-78) 115/62 mmHg (04/25 0545) SpO2:  [97 %-99 %] 97 % (04/25 0545) Weight:  [116 kg (255 lb 11.7 oz)] 116 kg (255 lb 11.7 oz) (04/24 1825)  Intake/Output from previous day: 04/24 0701 - 04/25 0700 In: 890 [P.O.:240; IV Piggyback:650] Out: 321 [Urine:320; Stool:1] Intake/Output this shift:    Physical Exam:  Abdomen : Soft, non distended, non tender.  Lab Results:  Recent Labs  06/27/12 1418 06/28/12 1259 06/29/12 0540  HGB 16.6 16.8 15.5  HCT 50.2 51.4 43.4   BMET  Recent Labs  06/28/12 1445 06/29/12 0540  NA 133* 135  K 3.9 3.9  CL 100 101  CO2 22 23  GLUCOSE 123* 113*  BUN 26* 24*  CREATININE 1.75* 1.69*  CALCIUM 8.1* 8.5   No results found for this basename: LABPT, INR,  in the last 72 hours No results found for this basename: LABURIN,  in the last 72 hours Results for orders placed during the hospital encounter of 06/28/12  CULTURE, BLOOD (ROUTINE X 2)     Status: None   Collection Time    06/28/12  4:15 PM      Result Value Range Status   Specimen Description BLOOD LEFT ARM   Final   Special Requests BOTTLES DRAWN AEROBIC AND ANAEROBIC 4CC   Final   Culture  Setup Time 06/28/2012 22:10   Final   Culture     Final   Value:        BLOOD CULTURE RECEIVED NO GROWTH TO DATE CULTURE WILL BE HELD FOR 5 DAYS BEFORE ISSUING A FINAL NEGATIVE REPORT   Report Status PENDING   Incomplete  CULTURE, BLOOD (ROUTINE X 2)     Status: None   Collection Time    06/28/12  4:25 PM      Result Value Range Status   Specimen Description BLOOD LEFT ARM   Final   Special Requests BOTTLES DRAWN AEROBIC AND ANAEROBIC 2CC   Final   Culture  Setup Time 06/28/2012 22:10   Final   Culture     Final   Value:         BLOOD CULTURE RECEIVED NO GROWTH TO DATE CULTURE WILL BE HELD FOR 5 DAYS BEFORE ISSUING A FINAL NEGATIVE REPORT   Report Status PENDING   Incomplete    Studies/Results: Dg Chest 2 View  06/29/2012  *RADIOLOGY REPORT*  Clinical Data: Fever  CHEST - 2 VIEW  Comparison: Prior chest x-ray 01/27/2012  Findings: Negative for focal airspace consolidation, pneumothorax or pleural effusion.  Stable cardiomegaly with left heart enlargement. Left subclavian approach biventricular cardiac rhythm maintenance device in unchanged position with leads projecting over the right atrium, right ventricle and within a vein overlying the left ventricle.  Stable appearance of right paratracheal mass with associated mass effect on the tracheal air column.  There is mild pulmonary vascular congestion without overt edema.  No acute osseous abnormality.  IMPRESSION:  1.  No acute cardiopulmonary disease 2.  Stable cardiomegaly with left heart enlargement 3.  Stable right paratracheal mass 4.  Mild vascular congestion without edema   Original Report Authenticated By: Malachy Moan, M.D.    Ct Abdomen Pelvis W Contrast  06/28/2012  *RADIOLOGY REPORT*  Clinical Data: Fever, urinary urgency, hematuria and dysuria  CT ABDOMEN AND PELVIS WITH CONTRAST  Technique:  Multidetector CT imaging of the abdomen and pelvis was performed following the standard protocol during bolus administration of intravenous contrast.  Contrast: 50mL OMNIPAQUE IOHEXOL 300 MG/ML  SOLN, 80mL OMNIPAQUE IOHEXOL 300 MG/ML  SOLN  Comparison: 07/02/2009.  Findings: The lung bases are clear.  Small calcified granulomas are noted.  The heart is enlarged.  No pericardial effusion.  The liver and spleen are unremarkable.  The gallbladder is surgically absent.  No common bile duct dilatation.  The pancreas is normal.  The adrenal glands and kidneys are normal.  The stomach, duodenum, small bowel and colon are unremarkable.  No inflammatory changes or mass lesions.  No  mesenteric or retroperitoneal mass or adenopathy.  The aorta is normal in caliber.  The major branch vessels are patent.  There is a vague enhancing 3 cm mass in the base of the bladder. Recommend urologic consultation and cystoscopic evaluation. The prostate gland is mildly enlarged.  No pelvic adenopathy or free pelvic fluid collections.  The seminal vesicles are unremarkable. No inguinal mass or hernia.  The bony structures are intact.  IMPRESSION:  1.  Suspect 3 cm mass in the base of the bladder.  Recommend urologic consultation and cystoscopic evaluation. 2.  No renal or ureteral lesions. 3.  No abdominal or pelvic lymphadenopathy.   Original Report Authenticated By: Rudie Meyer, M.D.    Dg Knee Complete 4 Views Right  06/29/2012  *RADIOLOGY REPORT*  Clinical Data: Right knee pain, fever  RIGHT KNEE - COMPLETE 4+ VIEW  Comparison: None.  Findings: No acute fracture, malalignment or knee joint effusion. Tricompartmental osteoarthritic changes are noted with mild joint space narrowing and productive osteophyte formation.  Incidental note is made of a fabella.  Normal bony mineralization.  No lytic or blastic osseous lesion.  No focal soft tissue abnormality.  IMPRESSION:  1.  No acute abnormality. 2.  Tricompartmental osteoarthritis.   Original Report Authenticated By: Malachy Moan, M.D.     Assessment/Plan: Gross hematuria.  Bladder mass  Needs cystoscopy.  Can be done as outpatient if urine is sterile   LOS: 1 day   [name] 06/29/2012, 12:14 PM

## 2012-06-29 NOTE — Progress Notes (Signed)
Patient home CPAP now in use. Power cords look intact and equipment appears well kept. Plugged into red power outlet and humidifier filled with sterile water. Patient utilizes a full face mask from home as well. VSS at this time. He is encouraged to call for further assistance if needed.

## 2012-06-29 NOTE — Progress Notes (Signed)
[facility]                                                                                Patient Demographics  [name], is a 52 y.o. male, DOB - 12/23/1960, MVH:[id number], WUX:[id number]  Admit date - 06/28/2012  Admitting Physician Pamella Pert, MD  Outpatient Primary MD for the patient is Mickie Hillier, MD  LOS - 1   Chief Complaint  Patient presents with  . Urinary Tract Infection        Assessment & Plan    UTI with Bladder Mass on CT scan  Fevers and leukocytosis have improved, continue Zosyn, will withhold vancomycin for now, will follow blood and urine cultures, urologist Dr. Brunilda Payor on case and following closely. Patient likely will need cystoscopy. He appears hemodynamically stable.      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 Patient is symptom free.   Echo [id number]  - Left ventricle: The cavity size was severely dilated. Wall thickness was normal. The estimated ejection fraction was 15%. Diffuse hypokinesis. Features are consistent with a pseudonormal left ventricular filling pattern, with concomitant abnormal relaxation and increased filling pressure (grade 2 diastolic dysfunction). - Aortic valve: Trivial regurgitation. - Mitral valve: Mild regurgitation. - Left atrium: The atrium was moderately dilated.      Recent R knee aspiration  - very benign knee exam, repeat x-rays are stable, he remains symptom-free.     Pacemaker/ICD  - on telemetry few PVCs, magnesium 1 g given, closely monitor electrolytes, has AICD.     Chronic kidney disease stage IV baseline creatinine in detail review appears to be between 1.6 and 2 Low-dose ACE inhibitor and monitor BMP     HTN  - Coreg, low-dose ACE and monitor     HLD  - continue  statin     Obstructive sleep apnea  Patient counseled to get his home CPAP and use at all times, he has been told that this can cause dysrhythmia with his underlying weak heart. He says his wife will get it tonight.      Code Status: Full  Family Communication: none present  Disposition Plan: Home   Procedures CT scan abdomen pelvis, likely will get cystoscopy   Consults  Urology   DVT Prophylaxis   SCDs    Lab Results  Component Value Date   PLT 145* 06/29/2012    Medications  Scheduled Meds: . atorvastatin  40 mg Oral Daily  . carvedilol  12.5 mg Oral Q breakfast  . carvedilol  18.75 mg Oral Q supper  . digoxin  0.125 mg Oral Daily  . magnesium sulfate 1 - 4 g bolus IVPB  1 g Intravenous Once  . olopatadine  2 drop Right Eye BID  . piperacillin-tazobactam (ZOSYN)  IV  3.375 g Intravenous Q8H  . sodium chloride  3 mL Intravenous Q12H  . vancomycin  1,500 mg Intravenous Q24H   Continuous Infusions:  PRN Meds:.colchicine  Antibiotics     Anti-infectives   Start  Dose/Rate Route Frequency Ordered Stop   06/29/12 1800  vancomycin (VANCOCIN) 1,500 mg in sodium chloride 0.9 % 500 mL IVPB     1,500 mg 250 mL/hr over 120 Minutes Intravenous Every 24 hours 06/28/12 1735     06/28/12 1745  vancomycin (VANCOCIN) 2,000 mg in sodium chloride 0.9 % 500 mL IVPB     2,000 mg 250 mL/hr over 120 Minutes Intravenous  Once 06/28/12 1735 06/28/12 2055   06/28/12 1745  piperacillin-tazobactam (ZOSYN) IVPB 3.375 g     3.375 g 12.5 mL/hr over 240 Minutes Intravenous 3 times per day 06/28/12 1735     06/28/12 1445  cefTRIAXone (ROCEPHIN) 1 g in dextrose 5 % 50 mL IVPB     1 g 100 mL/hr over 30 Minutes Intravenous  Once 06/28/12 1436 06/28/12 1548       Time Spent in minutes  35   Laurann Mcmorris K M.D on 06/29/2012 at 9:07 AM  Between 7am to 7pm - Pager - [phone]  After 7pm go to [email or web address] - password [email or web address]  And look for the night coverage person covering  for me after hours  [facility] Office  [phone]    Subjective:   [name] today has, No headache, No chest pain, No abdominal pain - No Nausea, No new weakness tingling or numbness, No Cough - SOB.    Objective:   Filed Vitals:   06/28/12 1731 06/28/12 1825 06/28/12 2203 06/29/12 0545  BP:  138/78 116/63 115/62  Pulse:  109 90 87  Temp:  101.3 F (38.5 C) 99 F (37.2 C) 98.5 F (36.9 C)  TempSrc:  Oral Oral Oral  Resp:  18 18 18   Height:  5\' 11"  (1.803 m)    Weight: 116 kg (255 lb 11.7 oz) 116 kg (255 lb 11.7 oz)    SpO2:  99% 97% 97%    Wt Readings from Last 3 Encounters:  06/28/12 116 kg (255 lb 11.7 oz)  06/28/12 116.121 kg (256 lb)  06/27/12 116.393 kg (256 lb 9.6 oz)     Intake/Output Summary (Last 24 hours) at 06/29/12 0907 Last data filed at 06/29/12 0532  Gross per 24 hour  Intake    890 ml  Output    321 ml  Net    569 ml    Exam Awake Alert, Oriented X 3, No new F.N deficits, Normal affect [place].AT,PERRAL, mild chronic right eye redness for years per patient Supple Neck,No JVD, No cervical lymphadenopathy appriciated.  Symmetrical Chest wall movement, Good air movement bilaterally, CTAB RRR,No Gallops,Rubs or new Murmurs, No Parasternal Heave +ve B.Sounds, Abd Soft, Non tender, No organomegaly appriciated, No rebound - guarding or rigidity. No Cyanosis, Clubbing or edema, No new Rash or bruise     Data Review   Micro Results Recent Results (from the past 240 hour(s))  URINE CULTURE     Status: None   Collection Time    06/27/12  2:10 PM      Result Value Range Status   Colony Count 50,000 COLONIES/ML   Preliminary   Preliminary Report Gram Negative Rods   Preliminary    Radiology Reports Dg Chest 2 View  06/29/2012  *RADIOLOGY REPORT*  Clinical Data: Fever  CHEST - 2 VIEW  Comparison: Prior chest x-ray 01/27/2012  Findings: Negative for focal airspace consolidation, pneumothorax or pleural effusion.  Stable cardiomegaly  with left heart enlargement. Left subclavian approach biventricular cardiac rhythm maintenance device in unchanged position with leads projecting over the right  atrium, right ventricle and within a vein overlying the left ventricle.  Stable appearance of right paratracheal mass with associated mass effect on the tracheal air column.  There is mild pulmonary vascular congestion without overt edema.  No acute osseous abnormality.  IMPRESSION:  1.  No acute cardiopulmonary disease 2.  Stable cardiomegaly with left heart enlargement 3.  Stable right paratracheal mass 4.  Mild vascular congestion without edema   Original Report Authenticated By: Malachy Moan, M.D.    Ct Abdomen Pelvis W Contrast  06/28/2012  *RADIOLOGY REPORT*  Clinical Data: Fever, urinary urgency, hematuria and dysuria  CT ABDOMEN AND PELVIS WITH CONTRAST  Technique:  Multidetector CT imaging of the abdomen and pelvis was performed following the standard protocol during bolus administration of intravenous contrast.  Contrast: 50mL OMNIPAQUE IOHEXOL 300 MG/ML  SOLN, 80mL OMNIPAQUE IOHEXOL 300 MG/ML  SOLN  Comparison: 07/02/2009.  Findings: The lung bases are clear.  Small calcified granulomas are noted.  The heart is enlarged.  No pericardial effusion.  The liver and spleen are unremarkable.  The gallbladder is surgically absent.  No common bile duct dilatation.  The pancreas is normal.  The adrenal glands and kidneys are normal.  The stomach, duodenum, small bowel and colon are unremarkable.  No inflammatory changes or mass lesions.  No mesenteric or retroperitoneal mass or adenopathy.  The aorta is normal in caliber.  The major branch vessels are patent.  There is a vague enhancing 3 cm mass in the base of the bladder. Recommend urologic consultation and cystoscopic evaluation. The prostate gland is mildly enlarged.  No pelvic adenopathy or free pelvic fluid collections.  The seminal vesicles are unremarkable. No inguinal mass or hernia.  The  bony structures are intact.  IMPRESSION:  1.  Suspect 3 cm mass in the base of the bladder.  Recommend urologic consultation and cystoscopic evaluation. 2.  No renal or ureteral lesions. 3.  No abdominal or pelvic lymphadenopathy.   Original Report Authenticated By: Rudie Meyer, M.D.    Dg Knee Complete 4 Views Right  06/29/2012  *RADIOLOGY REPORT*  Clinical Data: Right knee pain, fever  RIGHT KNEE - COMPLETE 4+ VIEW  Comparison: None.  Findings: No acute fracture, malalignment or knee joint effusion. Tricompartmental osteoarthritic changes are noted with mild joint space narrowing and productive osteophyte formation.  Incidental note is made of a fabella.  Normal bony mineralization.  No lytic or blastic osseous lesion.  No focal soft tissue abnormality.  IMPRESSION:  1.  No acute abnormality. 2.  Tricompartmental osteoarthritis.   Original Report Authenticated By: Malachy Moan, M.D.     CBC  Recent Labs Lab 06/27/12 1418 06/28/12 1259 06/29/12 0540  WBC 23.8* 31.4* 28.2*  HGB 16.6 16.8 15.5  HCT 50.2 51.4 43.4  PLT  --   --  145*  MCV 88.3 89.6 83.0  MCH 29.2 29.3 29.6  MCHC 33.1 32.7 35.7  RDW  --   --  15.0  LYMPHSABS  --   --  2.1  MONOABS  --   --  2.7*  EOSABS  --   --  0.0  BASOSABS  --   --  0.0    Chemistries   Recent Labs Lab 06/28/12 1228 06/28/12 1445 06/29/12 0540  NA 135 133* 135  K 4.2 3.9 3.9  CL 102 100 101  CO2 21 22 23   GLUCOSE 105* 123* 113*  BUN 26* 26* 24*  CREATININE 1.76* 1.75* 1.69*  CALCIUM 8.6 8.1* 8.5  AST 19  --  15  ALT 20  --  17  ALKPHOS 71  --  70  BILITOT 2.4*  --  2.0*   ------------------------------------------------------------------------------------------------------------------ estimated creatinine clearance is 65.5 ml/min (by C-G formula based on Cr of 1.69). ------------------------------------------------------------------------------------------------------------------  Recent Labs  06/28/12 1445  HGBA1C 6.3*    ------------------------------------------------------------------------------------------------------------------ No results found for this basename: CHOL, HDL, LDLCALC, TRIG, CHOLHDL, LDLDIRECT,  in the last 72 hours ------------------------------------------------------------------------------------------------------------------ No results found for this basename: TSH, T4TOTAL, FREET3, T3FREE, THYROIDAB,  in the last 72 hours ------------------------------------------------------------------------------------------------------------------ No results found for this basename: VITAMINB12, FOLATE, FERRITIN, TIBC, IRON, RETICCTPCT,  in the last 72 hours  Coagulation profile No results found for this basename: INR, PROTIME,  in the last 168 hours  No results found for this basename: DDIMER,  in the last 72 hours  Cardiac Enzymes No results found for this basename: CK, CKMB, TROPONINI, MYOGLOBIN,  in the last 168 hours ------------------------------------------------------------------------------------------------------------------ No components found with this basename: POCBNP,

## 2012-06-29 NOTE — Progress Notes (Signed)
Patient had a 7 beat run of VTach, VSS, patient resting comfortably. Notified house coverage, will continue to monitor.

## 2012-06-30 DIAGNOSIS — Z95810 Presence of automatic (implantable) cardiac defibrillator: Secondary | ICD-10-CM

## 2012-06-30 LAB — CBC
HCT: 40.7 % (ref 39.0–52.0)
Hemoglobin: 14.2 g/dL (ref 13.0–17.0)
MCHC: 34.9 g/dL (ref 30.0–36.0)
RBC: 4.91 MIL/uL (ref 4.22–5.81)
WBC: 20.1 10*3/uL — ABNORMAL HIGH (ref 4.0–10.5)

## 2012-06-30 LAB — BASIC METABOLIC PANEL
BUN: 25 mg/dL — ABNORMAL HIGH (ref 6–23)
Creatinine, Ser: 1.54 mg/dL — ABNORMAL HIGH (ref 0.50–1.35)
GFR calc Af Amer: 58 mL/min — ABNORMAL LOW (ref >90)
GFR calc non Af Amer: 50 mL/min — ABNORMAL LOW (ref >90)
Glucose, Bld: 92 mg/dL (ref 70–99)
Potassium: 4 mEq/L (ref 3.5–5.1)

## 2012-06-30 MED ORDER — AMOXICILLIN-POT CLAVULANATE 875-125 MG PO TABS: 1.0000 | ORAL_TABLET | Freq: Two times a day (BID) | ORAL | Status: DC

## 2012-06-30 MED ORDER — LORATADINE 10 MG PO TABS: 10.0000 mg | ORAL_TABLET | Freq: Every day | ORAL | Status: DC

## 2012-06-30 MED ORDER — ACIDOPHILUS PROBIOTIC 10 MG PO TABS: 1.0000 | ORAL_TABLET | Freq: Two times a day (BID) | ORAL | Status: DC

## 2012-06-30 MED ORDER — LORATADINE 10 MG PO TABS
10.0000 mg | ORAL_TABLET | Freq: Every day | ORAL | Status: DC
  Administered 2012-06-30: 10 mg via ORAL
  Filled 2012-06-30: qty 1

## 2012-06-30 MED ORDER — LEVOFLOXACIN 500 MG PO TABS
500.0000 mg | ORAL_TABLET | Freq: Every day | ORAL | Status: DC
  Filled 2012-06-30: qty 1

## 2012-06-30 NOTE — Progress Notes (Signed)
ANTIBIOTIC CONSULT NOTE - INITIAL  Pharmacy Consult for Levaquin Indication: UTI  No Known Allergies  Patient Measurements: Height: 5\' 11"  (180.3 cm) Weight: 253 lb (114.76 kg) IBW/kg (Calculated) : 75.3 Adjusted Body Weight:   Vital Signs: Temp: 99.3 F (37.4 C) (04/26 0540) Temp src: Oral (04/26 0540) BP: 113/76 mmHg (04/26 0540) Pulse Rate: 83 (04/26 0540) Intake/Output from previous day: 04/25 0701 - 04/26 0700 In: 530 [P.O.:480; IV Piggyback:50] Out: 825 [Urine:825] Intake/Output from this shift:    Labs:  Recent Labs  06/28/12 1259 06/28/12 1445 06/29/12 0540 06/30/12 0511  WBC 31.4*  --  28.2* 20.1*  HGB 16.8  --  15.5 14.2  PLT  --   --  145* 136*  CREATININE  --  1.75* 1.69* 1.54*   Estimated Creatinine Clearance: 71.5 ml/min (by C-G formula based on Cr of 1.54). No results found for this basename: VANCOTROUGH, VANCOPEAK, VANCORANDOM, GENTTROUGH, GENTPEAK, GENTRANDOM, TOBRATROUGH, TOBRAPEAK, TOBRARND, AMIKACINPEAK, AMIKACINTROU, AMIKACIN,  in the last 72 hours   Microbiology: Recent Results (from the past 720 hour(s))  URINE CULTURE     Status: None   Collection Time    06/27/12  2:10 PM      Result Value Range Status   Culture KLEBSIELLA PNEUMONIAE   Final   Colony Count 50,000 COLONIES/ML   Final   Organism ID, Bacteria KLEBSIELLA PNEUMONIAE   Final  URINE CULTURE     Status: None   Collection Time    06/28/12  2:14 PM      Result Value Range Status   Specimen Description URINE, CLEAN CATCH   Final   Special Requests NONE   Final   Culture  Setup Time 06/28/2012 22:07   Final   Colony Count NO GROWTH   Final   Culture NO GROWTH   Final   Report Status 06/29/2012 FINAL   Final  CULTURE, BLOOD (ROUTINE X 2)     Status: None   Collection Time    06/28/12  4:15 PM      Result Value Range Status   Specimen Description BLOOD LEFT ARM   Final   Special Requests BOTTLES DRAWN AEROBIC AND ANAEROBIC 4CC   Final   Culture  Setup Time 06/28/2012 22:10    Final   Culture     Final   Value:        BLOOD CULTURE RECEIVED NO GROWTH TO DATE CULTURE WILL BE HELD FOR 5 DAYS BEFORE ISSUING A FINAL NEGATIVE REPORT   Report Status PENDING   Incomplete  CULTURE, BLOOD (ROUTINE X 2)     Status: None   Collection Time    06/28/12  4:25 PM      Result Value Range Status   Specimen Description BLOOD LEFT ARM   Final   Special Requests BOTTLES DRAWN AEROBIC AND ANAEROBIC 2CC   Final   Culture  Setup Time 06/28/2012 22:10   Final   Culture     Final   Value:        BLOOD CULTURE RECEIVED NO GROWTH TO DATE CULTURE WILL BE HELD FOR 5 DAYS BEFORE ISSUING A FINAL NEGATIVE REPORT   Report Status PENDING   Incomplete    Medical History: Past Medical History  Diagnosis Date  . Gout   . Cardiomyopathy     Idiopathic dilated;   Marland Kitchen Ventricular tachycardia     s/p ICD  . HTN (hypertension)   . Sleep apnea     CPAP  . Systolic CHF  EF 15%  . CKD (chronic kidney disease)     stage III baseline Crt 1.8-2.0  . Biventricular ICD (implantable cardiac defibrillator) [employer] ]     DOI 2008/ upgrade 2010/ Gen Change 2014    Medications:  Scheduled:  . atorvastatin  40 mg Oral Daily  . carvedilol  12.5 mg Oral Q breakfast  . carvedilol  18.75 mg Oral Q supper  . digoxin  0.125 mg Oral Daily  . lisinopril  5 mg Oral Daily  . loratadine  10 mg Oral Daily  . [COMPLETED] magnesium sulfate 1 - 4 g bolus IVPB  1 g Intravenous Once  . olopatadine  2 drop Right Eye BID  . piperacillin-tazobactam (ZOSYN)  IV  3.375 g Intravenous Q8H  . sodium chloride  3 mL Intravenous Q12H   Infusions:   PRN: colchicine Assessment: 52 yo M with UTI  Goal of Therapy:  Eradication of infection  Plan: Begin Levaquin 500mg  po daily. ( Based on patient's CrCl)  Follow up culture results  Loletta Specter 06/30/2012,9:17 AM

## 2012-06-30 NOTE — Discharge Summary (Signed)
[facility]                                                                                   [name], is a 52 y.o. male  DOB 01/10/1961  MRN [id number].  Admission date:  06/28/2012  Discharge Date:  06/30/2012  Primary MD  Mickie Hillier, MD  Admitting Physician  Pamella Pert, MD  Admission Diagnosis  Essential hypertension, benign [401.1] Automatic implantable cardiac defibrillator in situ [V45.02] Obstructive sleep apnea (adult) (pediatric) [327.23] UTI (lower urinary tract infection) [599.0] Gout [274.9] Chronic combined systolic and diastolic CHF (congestive heart failure) [428.42, 428.0] Sepsis [038.9, 995.91]  Discharge Diagnosis     Active Problems:   OBSTRUCTIVE SLEEP APNEA   Essential hypertension, benign   CARDIOMYOPATHY   SYSTOLIC HEART FAILURE, CHRONIC   DYSPNEA   Biventricular ICD-Medtronic   Chronic combined systolic and diastolic CHF (congestive heart failure)    Past Medical History  Diagnosis Date  . Gout   . Cardiomyopathy     Idiopathic dilated;   Marland Kitchen Ventricular tachycardia     s/p ICD  . HTN (hypertension)   . Sleep apnea     CPAP  . Systolic CHF     EF 15%  . CKD (chronic kidney disease)     stage III baseline Crt 1.8-2.0  . Biventricular ICD (implantable cardiac defibrillator) Medtronic ]     DOI 2008/ upgrade 2010/ Gen Change 2014    Past Surgical History  Procedure Laterality Date  . Cholecystectomy    . Cardiac catheterization      he was found to have normal coronary arteries but with a globally dilated and hypocontractile heart  . US echocardiography  03-17-2008, 07-03-2006    EF 15-20%, EF 15-20%     Recommendations for primary care physician for things to follow:   Please follow patient's BMP, CBC, diuretic and ARB dose closely   Discharge Diagnoses:   Active Problems:   OBSTRUCTIVE SLEEP APNEA   Essential hypertension, benign   CARDIOMYOPATHY   SYSTOLIC HEART FAILURE, CHRONIC   DYSPNEA    Biventricular ICD-Medtronic   Chronic combined systolic and diastolic CHF (congestive heart failure)    Discharge Condition: stable   Diet recommendation: See Discharge Instructions below   Consults Urology    History of present illness and  Hospital Course:     Kindly see H&P for history of present illness and admission details, please review complete Labs, Consult reports and Test reports for all details in brief [name], is a 52 y.o. male,  with history of combined systolic and diastolic heart failure with EF 15% and grade 2 diastolic dysfunction on echo 2013, gouty arthritis, history of AICD, hypertension, dyslipidemia, chronic kidney disease stage IV baseline creatinine around 1.6-2, was admitted with complaints of fever, generalized weakness due to UTI versus early pyelonephritis, CT scan of the abdomen pelvis also showed a bladder mass of unclear etiology and significance.   His blood and urine cultures here were negative but outpatient urine cultures grew pansensitive Klebsiella 50,000 colonies, he was seen here by urologist Dr. Ezzie Dural, who suggested to continue medical treatment with outpatient urology followup for  cystoscopy at a later date. Patient with IV Zosyn has shown good clinical response, he is afebrile, feeling much better, white count has come down from 31,000-20,000, he has no flank pain or tenderness or dysuria. He will be placed on Augmentin for another 12 days bleeding a 2 week course for possible pyelonephritis. He will follow closely with urology Dr. Brunilda Payor for evaluation of his bladder mass.    Patient also had mild acute renal insufficiency which has come back to baseline, his ACE inhibitor and Lasix were held, they will be resumed per outpatient dose, I will request PCP to kindly monitor his CBC BMP along with his diuretic and ACE/ARB dose closely. He has been compensated from his heart failure standpoint I have recommended him to follow with his  cardiologist Dr. Augustina Mood with air week. Of note patient has the AICD.   For his hypertension, dyslipidemia he will continue his home medications, he is obstructive sleep apnea and he'll continue to use his CPAP at night unchanged.     Today   Subjective:   [name] today has no headache,no chest abdominal pain,no new weakness tingling or numbness, feels much better wants to go home today.    Objective:   Blood pressure 113/76, pulse 83, temperature 99.3 F (37.4 C), temperature source Oral, resp. rate 18, height 5\' 11"  (1.803 m), weight 114.76 kg (253 lb), SpO2 97.00%.   Intake/Output Summary (Last 24 hours) at 06/30/12 0937 Last data filed at 06/30/12 0542  Gross per 24 hour  Intake    290 ml  Output    825 ml  Net   -535 ml    Exam Awake Alert, Oriented *3, No new F.N deficits, Normal affect [place].AT,PERRAL Supple Neck,No JVD, No cervical lymphadenopathy appriciated.  Symmetrical Chest wall movement, Good air movement bilaterally, CTAB RRR,No Gallops,Rubs or new Murmurs, No Parasternal Heave +ve B.Sounds, Abd Soft, Non tender, No organomegaly appriciated, No rebound -guarding or rigidity. No Cyanosis, Clubbing or edema, No new Rash or bruise  Data Review   Major procedures and Radiology Reports - PLEASE review detailed and final reports for all details in brief -       Dg Chest 2 View  06/29/2012  *RADIOLOGY REPORT*  Clinical Data: Fever  CHEST - 2 VIEW  Comparison: Prior chest x-ray 01/27/2012  Findings: Negative for focal airspace consolidation, pneumothorax or pleural effusion.  Stable cardiomegaly with left heart enlargement. Left subclavian approach biventricular cardiac rhythm maintenance device in unchanged position with leads projecting over the right atrium, right ventricle and within a vein overlying the left ventricle.  Stable appearance of right paratracheal mass with associated mass effect on the tracheal air column.  There is mild pulmonary vascular  congestion without overt edema.  No acute osseous abnormality.  IMPRESSION:  1.  No acute cardiopulmonary disease 2.  Stable cardiomegaly with left heart enlargement 3.  Stable right paratracheal mass 4.  Mild vascular congestion without edema   Original Report Authenticated By: Malachy Moan, M.D.    Ct Abdomen Pelvis W Contrast  06/28/2012  *RADIOLOGY REPORT*  Clinical Data: Fever, urinary urgency, hematuria and dysuria  CT ABDOMEN AND PELVIS WITH CONTRAST  Technique:  Multidetector CT imaging of the abdomen and pelvis was performed following the standard protocol during bolus administration of intravenous contrast.  Contrast: 50mL OMNIPAQUE IOHEXOL 300 MG/ML  SOLN, 80mL OMNIPAQUE IOHEXOL 300 MG/ML  SOLN  Comparison: 07/02/2009.  Findings: The lung bases are clear.  Small calcified granulomas are noted.  The heart is enlarged.  No pericardial effusion.  The liver and spleen are unremarkable.  The gallbladder is surgically absent.  No common bile duct dilatation.  The pancreas is normal.  The adrenal glands and kidneys are normal.  The stomach, duodenum, small bowel and colon are unremarkable.  No inflammatory changes or mass lesions.  No mesenteric or retroperitoneal mass or adenopathy.  The aorta is normal in caliber.  The major branch vessels are patent.  There is a vague enhancing 3 cm mass in the base of the bladder. Recommend urologic consultation and cystoscopic evaluation. The prostate gland is mildly enlarged.  No pelvic adenopathy or free pelvic fluid collections.  The seminal vesicles are unremarkable. No inguinal mass or hernia.  The bony structures are intact.  IMPRESSION:  1.  Suspect 3 cm mass in the base of the bladder.  Recommend urologic consultation and cystoscopic evaluation. 2.  No renal or ureteral lesions. 3.  No abdominal or pelvic lymphadenopathy.   Original Report Authenticated By: Rudie Meyer, M.D.    Dg Knee Complete 4 Views Right  06/29/2012  *RADIOLOGY REPORT*  Clinical Data:  Right knee pain, fever  RIGHT KNEE - COMPLETE 4+ VIEW  Comparison: None.  Findings: No acute fracture, malalignment or knee joint effusion. Tricompartmental osteoarthritic changes are noted with mild joint space narrowing and productive osteophyte formation.  Incidental note is made of a fabella.  Normal bony mineralization.  No lytic or blastic osseous lesion.  No focal soft tissue abnormality.  IMPRESSION:  1.  No acute abnormality. 2.  Tricompartmental osteoarthritis.   Original Report Authenticated By: Malachy Moan, M.D.     Micro Results      Recent Results (from the past 240 hour(s))  URINE CULTURE     Status: None   Collection Time    06/27/12  2:10 PM      Result Value Range Status   Culture KLEBSIELLA PNEUMONIAE   Final   Colony Count 50,000 COLONIES/ML   Final   Organism ID, Bacteria KLEBSIELLA PNEUMONIAE   Final  URINE CULTURE     Status: None   Collection Time    06/28/12  2:14 PM      Result Value Range Status   Specimen Description URINE, CLEAN CATCH   Final   Special Requests NONE   Final   Culture  Setup Time 06/28/2012 22:07   Final   Colony Count NO GROWTH   Final   Culture NO GROWTH   Final   Report Status 06/29/2012 FINAL   Final  CULTURE, BLOOD (ROUTINE X 2)     Status: None   Collection Time    06/28/12  4:15 PM      Result Value Range Status   Specimen Description BLOOD LEFT ARM   Final   Special Requests BOTTLES DRAWN AEROBIC AND ANAEROBIC 4CC   Final   Culture  Setup Time 06/28/2012 22:10   Final   Culture     Final   Value:        BLOOD CULTURE RECEIVED NO GROWTH TO DATE CULTURE WILL BE HELD FOR 5 DAYS BEFORE ISSUING A FINAL NEGATIVE REPORT   Report Status PENDING   Incomplete  CULTURE, BLOOD (ROUTINE X 2)     Status: None   Collection Time    06/28/12  4:25 PM      Result Value Range Status   Specimen Description BLOOD LEFT ARM   Final   Special Requests BOTTLES DRAWN AEROBIC AND ANAEROBIC  [facility]   Final   Culture  Setup Time 06/28/2012 22:10    Final   Culture     Final   Value:        BLOOD CULTURE RECEIVED NO GROWTH TO DATE CULTURE WILL BE HELD FOR 5 DAYS BEFORE ISSUING A FINAL NEGATIVE REPORT   Report Status PENDING   Incomplete     CBC w Diff: Lab Results  Component Value Date   WBC 20.1* 06/30/2012   WBC 31.4* 06/28/2012   HGB 14.2 06/30/2012   HGB 16.8 06/28/2012   HCT 40.7 06/30/2012   HCT 51.4 06/28/2012   PLT 136* 06/30/2012   LYMPHOPCT 8* 06/29/2012   MONOPCT 9 06/29/2012   EOSPCT 0 06/29/2012   BASOPCT 0 06/29/2012    CMP: Lab Results  Component Value Date   NA 136 06/30/2012   K 4.0 06/30/2012   CL 103 06/30/2012   CO2 21 06/30/2012   BUN 25* 06/30/2012   CREATININE 1.54* 06/30/2012   CREATININE 1.76* 06/28/2012   PROT 6.8 06/29/2012   ALBUMIN 2.9* 06/29/2012   BILITOT 2.0* 06/29/2012   ALKPHOS 70 06/29/2012   AST 15 06/29/2012   ALT 17 06/29/2012  .   Discharge Instructions     Follow with Primary MD Mickie Hillier, MD in 3 days   Get CBC, CMP, checked 3 days by Primary MD and again as instructed by your Primary MD.   Get Medicines reviewed and adjusted.  Please request your Prim.MD to go over all Hospital Tests and Procedure/Radiological results at the follow up, please get all Hospital records sent to your Prim MD by signing hospital release before you go home.  Activity: As tolerated with Full fall precautions use walker/cane & assistance as needed   Diet:  Heart Healthy, Aspiration precautions.   Check your Weight same time everyday, if you gain over 2 pounds, or you develop in leg swelling, experience more shortness of breath or chest pain, call your Primary MD immediately. Follow Cardiac Low Salt Diet and 1.8 lit/day fluid restriction.  Disposition Home    If you experience worsening of your admission symptoms, develop shortness of breath, life threatening emergency, suicidal or homicidal thoughts you must seek medical attention immediately by calling 911 or calling your MD immediately  if symptoms  less severe.  You Must read complete instructions/literature along with all the possible adverse reactions/side effects for all the Medicines you take and that have been prescribed to you. Take any new Medicines after you have completely understood and accpet all the possible adverse reactions/side effects.   Do not drive and provide baby sitting services if your were admitted for syncope or siezures until you have seen by Primary MD or a Neurologist and advised to do so again.  Do not drive when taking Pain medications.    Do not take more than prescribed Pain, Sleep and Anxiety Medications  Special Instructions: If you have smoked or chewed Tobacco  in the last 2 yrs please stop smoking, stop any regular Alcohol  and or any Recreational drug use.  Wear Seat belts while driving.   Follow-up Information   Follow up with Mickie Hillier, MD. Schedule an appointment as soon as possible for a visit in 3 days.   Contact information:   [address] [place] [address] [phone]       Follow up with NESI,MARC-HENRY, MD. Schedule an appointment as soon as possible for a visit in 1 week.   Contact information:  [address] [place] [address] [phone]       Follow up with Arvilla Meres, MD. Schedule an appointment as soon as possible for a visit in 1 week.   Contact information:   [address] [place] [address] [phone]         Discharge Medications     Medication List    STOP taking these medications       ciprofloxacin 500 MG tablet  Commonly known as:  CIPRO      TAKE these medications       ACIDOPHILUS PROBIOTIC 10 MG Tabs  Take 1 capsule by mouth 2 (two) times daily.     allopurinol 300 MG tablet  Commonly known as:  ZYLOPRIM  Take 300 mg by mouth daily.     amoxicillin-clavulanate 875-125 MG per tablet  Commonly known as:  AUGMENTIN  Take 1 tablet by mouth 2  (two) times daily.     atorvastatin 40 MG tablet  Commonly known as:  LIPITOR  Take 1 tablet (40 mg total) by mouth daily.     carvedilol 12.5 MG tablet  Commonly known as:  COREG  Take 12.5-18.75 mg by mouth 2 (two) times daily with a meal. 1 tab in am, 1.5 tabs in pm     colchicine 0.6 MG tablet  Take 1 tablet (0.6 mg total) by mouth 2 (two) times daily as needed (gout).     digoxin 0.25 MG tablet  Commonly known as:  LANOXIN  Take 0.125 mg by mouth daily.     furosemide 40 MG tablet  Commonly known as:  LASIX  Take 40 mg by mouth every Monday, Wednesday, and Friday.     loratadine 10 MG tablet  Commonly known as:  CLARITIN  Take 1 tablet (10 mg total) by mouth daily.     olmesartan 20 MG tablet  Commonly known as:  BENICAR  Take 1 tablet (20 mg total) by mouth daily.           Total Time in preparing paper work, data evaluation and todays exam - 35 minutes  Leroy Sea M.D on 06/30/2012 at 9:37 AM  [facility] Office  [phone]

## 2012-07-04 LAB — CULTURE, BLOOD (ROUTINE X 2)
Culture: NO GROWTH
Culture: NO GROWTH

## 2012-07-11 ENCOUNTER — Ambulatory Visit (HOSPITAL_COMMUNITY)
Admission: RE | Admit: 2012-07-11 | Discharge: 2012-07-11 | Disposition: A | Discharge status: Home / Self Care | Payer: 59 | Source: Ambulatory Visit | Attending: Internal Medicine | Admitting: Internal Medicine

## 2012-07-11 VITALS — BP 112/78 | HR 93 | Wt 247.0 lb

## 2012-07-11 DIAGNOSIS — N3289 Other specified disorders of bladder: Secondary | ICD-10-CM

## 2012-07-11 DIAGNOSIS — I5022 Chronic systolic (congestive) heart failure: Secondary | ICD-10-CM | POA: Insufficient documentation

## 2012-07-11 DIAGNOSIS — I509 Heart failure, unspecified: Secondary | ICD-10-CM | POA: Insufficient documentation

## 2012-07-11 NOTE — Patient Instructions (Addendum)
Can take prednisone 40 mg today and tomorrow for gout.    Follow up 6 weeks.    Will call with procedure recommendations.

## 2012-07-11 NOTE — Progress Notes (Signed)
PCP: Dr Clarene Duke Nephrologist: Dr [name] Cardiologist: Dr Graciela Husbands   HPI:  [name] is a 52 year old with a PMH of gout, nonischemic cardiomyopathy (EF 15%) S/P CRT-D implantation 2010, VT with amiodarone which was discontinued because of lightheadedness.  He also has prostatitis, and OSA (CPAP). LHC 2008: Normal cors.    He is intoelrant to numerous HF meds including spiro (severe hyperkalemia - requiring HD), hydralazine (fuzzy-headed) and imdur (severe HAs).   RHC 01/28/12  RA = 1  RV = 28/2/4  PA = 32/17 (22)  PCW = 6  Fick cardiac output/index = 5.1/2.2  Thermo cardiac output/index = 5.0/2.1  PVR = 4.0 [place]  FA sat = 98%  PA sat = 64%, 68%  11/29/11 ECHO EF 15% Diffuse hypokinesis. Features are consistent with a pseudonormal left ventricular filling pattern, with concomitant abnormal relaxation and increased filling pressure (grade 2 diastolic dysfunction).  Admitted to [facility] 11/21/2013after his ICD fired 5 times  Potassium >7.5  Required urgent dialysis. Discharged form [facility] 01/29/12. Discharge weight 250 pounds. Spironolactone stopped, Benicar cut back to 20 mg daily, Carvedilol cut back 12.5 mg twice day, hydralazine cut back to 10 mg tid, and Isordil was started at 10 mg daily. Discharge Potassium 4.0 Creatinine 1.98.    CPX test 12/13 Resting HR: 90 Peak HR: 150 % age predicted max HR: 89% BP rest: 121/88 BP peak: 189/112 Peak VO2: 15.2 % predicted peak VO2: 58.3% (When adjusted to the patient's ideal body weight of 176 lb (79.8 kg) the peak VO2 is 22 ml/kg (ibw)/min (63% of the ibw-adjusted predicted) VE/VCO2 slope: 26.4 OUES: 2.29 Peak RER: 1.05 Ventilatory Threshold: 12.3 % predicted peak VO2: 47.2% Peak RR 41 Peak Ventilation: 51.0 VE/MVV: 58% PETCO2 at peak: 38 O2pulse: 12 % predicted O2pulse: 67%  He returns for routine follow up today.  He was recently discharged from [facility] with klebseilla UTI, will finish abx in the next day or so.  A CT scan of the  abdomen/pelvis showed a bladder mass and he has instructions to follow up with Dr. Brunilda Payor for cystoscopy.  He is c/o gout pain in right knee.  He has not gone back to the gym recently with hospitalization and now gout pain.  He denies dyspnea, orthopnea or PND.  No edema.  No chest pain.   Review of Systems: All pertinent positives and negatives as in HPI, otherwise negative.     Past Medical History  Diagnosis Date  . Gout   . Cardiomyopathy     Idiopathic dilated;   Marland Kitchen Ventricular tachycardia     s/p ICD  . HTN (hypertension)   . Sleep apnea     CPAP  . Systolic CHF     EF 15%  . CKD (chronic kidney disease)     stage III baseline Crt 1.8-2.0  . Biventricular ICD (implantable cardiac defibrillator) Medtronic ]     DOI 2008/ upgrade 2010/ Gen Change 2014    Current Outpatient Prescriptions  Medication Sig Dispense Refill  . amoxicillin-clavulanate (AUGMENTIN) 875-125 MG per tablet Take 1 tablet by mouth 2 (two) times daily.  24 tablet  0  . atorvastatin (LIPITOR) 40 MG tablet Take 1 tablet (40 mg total) by mouth daily.  90 tablet  1  . carvedilol (COREG) 12.5 MG tablet Take 12.5-18.75 mg by mouth 2 (two) times daily with a meal. 1 tab in am, 1.5 tabs in pm      . colchicine 0.6 MG tablet Take 1 tablet (0.6  mg total) by mouth 2 (two) times daily as needed (gout).  10 tablet  0  . digoxin (LANOXIN) 0.25 MG tablet Take 0.125 mg by mouth daily.       . Lactobacillus (ACIDOPHILUS PROBIOTIC) 10 MG TABS Take 1 capsule by mouth 2 (two) times daily.  30 tablet  0  . loratadine (CLARITIN) 10 MG tablet Take 1 tablet (10 mg total) by mouth daily.  30 tablet  0  . olmesartan (BENICAR) 20 MG tablet Take 1 tablet (20 mg total) by mouth daily.  30 tablet  3  . allopurinol (ZYLOPRIM) 300 MG tablet Take 300 mg by mouth daily.      . furosemide (LASIX) 40 MG tablet Take 40 mg by mouth every Monday, Wednesday, and Friday.       No current facility-administered medications for this encounter.      No Known Allergies  History   Social History  . Marital Status: Married    Spouse Name: N/A    Number of Children: N/A  . Years of Education: N/A   Occupational History  . Not on file.   Social History Main Topics  . Smoking status: Never Smoker   . Smokeless tobacco: Never Used  . Alcohol Use: No  . Drug Use: No  . Sexually Active: No   Other Topics Concern  . Not on file   Social History Narrative  . No narrative on file    No family history on file.  PHYSICAL EXAM: Filed Vitals:   07/11/12 1354  BP: 112/78  Weight: 247 lb (112.038 kg)    General:  Well appearing. No respiratory difficulty HEENT: normal Neck: supple. JVP 5-6. Carotids 2+ bilat; no bruits. No lymphadenopathy or thryomegaly appreciated. Cor: PMI nondisplaced. Regular rate & rhythm. No rubs, gallops or murmurs. Lungs: clear Abdomen: soft, nontender, nondistended. No hepatosplenomegaly. No bruits or masses. Good bowel sounds. Extremities: no cyanosis, clubbing, rash, edema Neuro: alert & oriented x 3, cranial nerves grossly intact. moves all 4 extremities w/o difficulty. Affect pleasant.    ASSESSMENT & PLAN:

## 2012-07-13 DIAGNOSIS — C679 Malignant neoplasm of bladder, unspecified: Secondary | ICD-10-CM | POA: Insufficient documentation

## 2012-07-13 NOTE — Assessment & Plan Note (Signed)
NYHA II but continues to feel fatigued.  He is intolerant to multiple HF medications.  Will not titrate carvedilol due to fatigue.  No spiro due to severe hyperkalemia, hydralazine/imdur HAs and fuzzy feeling.  I have asked him to restart lasix as he has been holding since being in the hospital.  He will continue to weight daily.

## 2012-07-13 NOTE — Assessment & Plan Note (Signed)
Patient requires cystoscopy with recent finding of bladder mass.  I have discussed with Dr. Gala Romney as patient's EF is 15% but CPX does not demonstrate severe limitations due to heart failure.  He is ok to proceed with cystoscopy.  Will send to Dr. Brunilda Payor.

## 2012-07-24 ENCOUNTER — Other Ambulatory Visit: Payer: Self-pay | Admitting: Urology

## 2012-07-26 ENCOUNTER — Encounter (HOSPITAL_COMMUNITY): Payer: Self-pay | Admitting: Pharmacy Technician

## 2012-07-30 ENCOUNTER — Encounter: Payer: Self-pay | Admitting: Internal Medicine

## 2012-07-31 ENCOUNTER — Telehealth: Payer: Self-pay | Admitting: Internal Medicine

## 2012-07-31 NOTE — Telephone Encounter (Signed)
New Prob      Pt ha some questions regarding remote check. Please calll.

## 2012-07-31 NOTE — Telephone Encounter (Signed)
Patient had c/o "not feeling well" over this past weekend and sent a manual transmission.  1 NSVT 5/21.  No other episodes since 06/15/12.  Next scheduled remote 08/13/12.

## 2012-08-03 ENCOUNTER — Encounter (HOSPITAL_COMMUNITY)
Admission: RE | Admit: 2012-08-03 | Discharge: 2012-08-03 | Disposition: A | Discharge status: Home / Self Care | Payer: 59 | Source: Ambulatory Visit | Attending: Urology | Admitting: Urology

## 2012-08-03 ENCOUNTER — Encounter (HOSPITAL_COMMUNITY): Payer: Self-pay

## 2012-08-03 DIAGNOSIS — Z01812 Encounter for preprocedural laboratory examination: Secondary | ICD-10-CM | POA: Insufficient documentation

## 2012-08-03 HISTORY — DX: Unspecified osteoarthritis, unspecified site: M19.90

## 2012-08-03 HISTORY — DX: Heart failure, unspecified: I50.9

## 2012-08-03 HISTORY — DX: Presence of automatic (implantable) cardiac defibrillator: Z95.810

## 2012-08-03 HISTORY — DX: Neoplasm of unspecified behavior of bladder: D49.4

## 2012-08-03 LAB — CBC
HCT: 42.8 % (ref 39.0–52.0)
Hemoglobin: 15.2 g/dL (ref 13.0–17.0)
MCH: 29.4 pg (ref 26.0–34.0)
MCHC: 35.5 g/dL (ref 30.0–36.0)
MCV: 82.8 fL (ref 78.0–100.0)
Platelets: 186 10*3/uL (ref 150–400)
RBC: 5.17 MIL/uL (ref 4.22–5.81)
RDW: 15.1 % (ref 11.5–15.5)
WBC: 10.7 10*3/uL — ABNORMAL HIGH (ref 4.0–10.5)

## 2012-08-03 LAB — BASIC METABOLIC PANEL
BUN: 21 mg/dL (ref 6–23)
CO2: 24 mEq/L (ref 19–32)
Calcium: 9.1 mg/dL (ref 8.4–10.5)
Chloride: 102 mEq/L (ref 96–112)
Creatinine, Ser: 1.41 mg/dL — ABNORMAL HIGH (ref 0.50–1.35)
GFR calc Af Amer: 64 mL/min — ABNORMAL LOW (ref >90)
GFR calc non Af Amer: 55 mL/min — ABNORMAL LOW (ref >90)
Glucose, Bld: 93 mg/dL (ref 70–99)
Potassium: 4.3 mEq/L (ref 3.5–5.1)
Sodium: 137 mEq/L (ref 135–145)

## 2012-08-03 LAB — SURGICAL PCR SCREEN
MRSA, PCR: NEGATIVE
Staphylococcus aureus: NEGATIVE

## 2012-08-03 NOTE — Patient Instructions (Signed)
YOUR SURGERY IS SCHEDULED AT [facility]  ON:   Monday  June 9  REPORT TO [facility] AT:  8:00 AM      PHONE # FOR SHORT STAY IS [phone]  DO NOT EAT OR DRINK ANYTHING AFTER MIDNIGHT THE NIGHT BEFORE YOUR SURGERY.  YOU MAY BRUSH YOUR TEETH, RINSE OUT YOUR MOUTH--BUT NO WATER, NO FOOD, NO CHEWING GUM, NO MINTS, NO CANDIES, NO CHEWING TOBACCO.   PLEASE TAKE THE FOLLOWING MEDICATIONS THE AM OF YOUR SURGERY WITH A FEW SIPS OF WATER:  CARVEDILOL, COLCHICINE, DIGOXIN, CLARITIN     IF YOU HAVE SLEEP APNEA AND USE CPAP OR BIPAP--PLEASE BRING THE MASK AND THE TUBING.  DO NOT BRING YOUR MACHINE.  DO NOT BRING VALUABLES, MONEY, CREDIT CARDS.  DO NOT WEAR JEWELRY, MAKE-UP, NAIL POLISH AND NO METAL PINS OR CLIPS IN YOUR HAIR. CONTACT LENS, DENTURES / PARTIALS, GLASSES SHOULD NOT BE WORN TO SURGERY AND IN MOST CASES-HEARING AIDS WILL NEED TO BE REMOVED.  BRING YOUR GLASSES CASE, ANY EQUIPMENT NEEDED FOR YOUR CONTACT LENS. FOR PATIENTS ADMITTED TO THE HOSPITAL--CHECK OUT TIME THE DAY OF DISCHARGE IS 11:00 AM.  ALL INPATIENT ROOMS ARE PRIVATE - WITH BATHROOM, TELEPHONE, TELEVISION AND WIFI INTERNET.  IF YOU ARE BEING DISCHARGED THE SAME DAY OF YOUR SURGERY--YOU CAN NOT DRIVE YOURSELF HOME--AND SHOULD NOT GO HOME ALONE BY TAXI OR BUS.  NO DRIVING OR OPERATING MACHINERY FOR 24 HOURS FOLLOWING ANESTHESIA / PAIN MEDICATIONS.  PLEASE MAKE ARRANGEMENTS FOR SOMEONE TO BE WITH YOU AT HOME THE FIRST 24 HOURS AFTER SURGERY. RESPONSIBLE DRIVER'S NAME  Roderic Scarce                                               PHONE #   [phone]                            PLEASE READ OVER ANY  FACT SHEETS THAT YOU WERE GIVEN: MRSA INFORMATION, BLOOD TRANSFUSION INFORMATION, INCENTIVE SPIROMETER INFORMATION. FAILURE TO FOLLOW THESE INSTRUCTIONS MAY RESULT IN THE CANCELLATION OF YOUR SURGERY.   PATIENT SIGNATURE_________________________________

## 2012-08-03 NOTE — Pre-Procedure Instructions (Signed)
EKG REPORT IN [facility] 05/02/12. CXR REPORT IN [facility] 06/29/12. CARDIOLOGY OFFICE NOTE IN EPIC 04/12/12.  LAST ICD INTERROGATION REPORT 05/21/12 IN EPIC.  PERIOPERATIVE PRESCRIPTION FOR IMPLANTED CARDIAC DEVICE PROGRAMMING ON PT'S CHART - SIGNED BY DR. Graciela Husbands.  MEDTRONIC ICD.

## 2012-08-03 NOTE — Pre-Procedure Instructions (Signed)
PT'S LAST OFFICE NOTE  AND LABS FROM HIS NEPHROLOGIST - DR. COLADONATO 06/12/12 OBTAINED AND ON PT'S CHART.

## 2012-08-06 NOTE — Pre-Procedure Instructions (Signed)
Pt's preop creatinine 1.41 - preop BMET report faxed to Dr. Margrett Rud office.

## 2012-08-07 ENCOUNTER — Encounter: Payer: Self-pay | Admitting: Internal Medicine

## 2012-08-12 MED ORDER — CIPROFLOXACIN IN D5W 200 MG/100ML IV SOLN
200.0000 mg | INTRAVENOUS | Status: AC
  Administered 2012-08-13: 200 mg via INTRAVENOUS
  Filled 2012-08-12 (×2): qty 100

## 2012-08-12 NOTE — H&P (Signed)
Reason For Visit          [name] Is a 52 year old male who presents today for TURBT   History of Present Illness      I saw Mr [name] in consultation on 4/24 for gross hematuria.  CT scan revealed a filling defect at the base of the bladder.  He is a patient of Dr [name].  Mr [name] has not seen any more blood in his urine.  Urinalysis shows TNTC WBC's, TNTC RBC's, many bacteria.  He needs cystoscopy for evaluation of the bladder. Cystoscopy shows a papillary tumor on the right antero lateral wall of the bladder near the bladder neck.                Bilateral nephrolithiasis - This was detected on a renal ultrasound by Dr. [name]. The ultrasound did reveal no evidence of hydronephrosis, mass or obstruction of the kidneys. He has mild renal insufficiency and Dr. [name] is following him for that. Laboratory studies reveal that his creatinine has improved and he has an elevated serum uric acid.  He does have some chronic low back pain mainly on the right but he has had this for a number of years and I don't feel it is related to his kidney stones.  He does have gout.       Chronic prostatitis - he has intermittent flares of this and typically describes a pulling-type suprapubic sensation as well as discomfort in the perineal region with his flares of prostatitis.       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 He was positive for HSV2 however he reports he has never had a penile lesion and I described their appearance as well as the typical symptoms associated with an outbreak. He has not been sexually active for some time but also remains completely asymptomatic.  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   IPSS (12/29/11): 11   Past Medical History Problems  1. History of  Herpes Simplex Type II 054.10 2. History of  Renal Insufficiency 593.9 3. History of  Trichomonal Urethritis 131.02 4. History of  Urethral Stricture 598.9  Surgical History Problems  1. History of  Cholecystectomy  Current Meds 1. Allopurinol 300 MG Oral Tablet; Therapy: 02Aug2013 to 2. Amiodarone HCl 200 MG Oral Tablet; Therapy: (Recorded:23Dec2008) to 3. Benicar 40 MG Oral Tablet; Therapy: 01Jun2013 to 4. Carvedilol 25 MG Oral Tablet; Therapy: 03May2013 to 5. Digoxin TABS; Therapy: (Recorded:30Mar2010) to 6. Furosemide 40 MG Oral Tablet; Therapy: 31Jan2013 to 7. Levofloxacin 500 MG Oral Tablet; Take 1 tablet daily; Therapy: 19May2014 to  (Evaluate:26May2014)  Requested for: 19May2014; Last Rx:19May2014 8. Lipitor 40 MG Oral Tablet; Therapy: (Recorded:23Dec2008) to 9. Metoprolol Succinate ER 25 MG Oral Tablet Extended Release 24 Hour; Therapy: 17Sep2013 to 10. Spironolactone 25 MG Oral Tablet; Therapy: (Recorded:23Dec2008) to  Allergies Medication  1. No Known Drug Allergies  Family History Problems  1. Family history of  Family Health Status Children ___ Living Sons 3  Social History Problems  1. Caffeine Use 1 can of soda a day 2. Marital History - Currently Married 3. Never A Smoker 4. Occupation: Warden/ranger Denied  5. History  of  Alcohol Use  Review of Systems Genitourinary, constitutional, skin, eye, otolaryngeal, hematologic/lymphatic, cardiovascular, pulmonary, endocrine, musculoskeletal, gastrointestinal, neurological and psychiatric system(s) were reviewed and pertinent findings if present are noted.   Vitals Vital Signs  BMI Calculated: 35.79 BSA Calculated: 2.3 Height: 5 ft 10 in Weight: 250 lb  Blood Pressure: 126 / 80 Temperature: 97.1 F Heart Rate: 83  Physical Exam Constitutional: Well nourished and well developed . No acute  distress.  ENT:. The ears and nose are normal in appearance.  Neck: The appearance of the neck is normal and no neck mass is present.  Pulmonary: No respiratory distress and normal respiratory rhythm and effort.  Cardiovascular: Heart rate and rhythm are normal . No peripheral edema.  Abdomen: The abdomen is mildly obese. The abdomen is soft and nontender. No masses are palpated. No CVA tenderness. No hernias are palpable. No hepatosplenomegaly noted.  Rectal: Rectal exam demonstrates normal sphincter tone, no tenderness and no masses. Estimated prostate size is 1+. The prostate has no nodularity and is not tender. The left seminal vesicle is nonpalpable. The right seminal vesicle is nonpalpable. The perineum is normal on inspection.  Genitourinary: Examination of the penis demonstrates no discharge, no masses, no lesions and a normal meatus. The scrotum is without lesions. The right epididymis is palpably normal and non-tender. The left epididymis is palpably normal and non-tender. The right testis is non-tender and without masses. The left testis is non-tender and without masses.  Lymphatics: The femoral and inguinal nodes are not enlarged or tender.  Skin: Normal skin turgor, no visible rash and no visible skin lesions.  Neuro/Psych:. Mood and affect are appropriate.   Procedure  Procedure: Cystoscopy   Indication: Hematuria. Bladder Mass.  Informed Consent: Risks, benefits, and potential adverse events were discussed and informed consent was obtained from the patient . Specific risks including, but not limited to bleeding, infection, pain, allergic reaction etc. were explained.  Prep: The patient was prepped with betadine.  Anesthesia:. Local anesthesia was administered intraurethrally with 2% lidocaine jelly.  Antibiotic prophylaxis: Ciprofloxacin.  Procedure Note:  Urethral meatus:. No abnormalities.  Anterior urethra: No abnormalities.  Prostatic urethra:. The lateral and median  prostatic lobes were enlarged.  Bladder: Visulization was obscured due to cloudy urine. The ureteral orifices were in the normal anatomic position bilaterally. A solitary tumor was visualized in the bladder. A papillary tumor was seen in the bladder measuring approximately 4 cm in size. This tumor was located on the right side, on the anterior aspect, at the neck of the bladder   Impression: I went over the results of the CT scan as well as my cystoscopic findings today which have revealed a bladder mass consistent with transitional cell carcinoma. We discussed the fact that currently there is no evidence of extravesical extension or pelvic adenopathy based on CT scan findings. Further characterization of the lesion is required for grading and staging purposes. We discussed proceeding with evaluation using transurethral resection of the lesion. I have discussed the procedure in detail as well as the potential risks and complications associated with this form of surgery. We also discussed the probability of successful resection of the intravesical portion of this lesion. I have recommended, as long as there is no contraindication at the time of surgery, the placement of intravesical mitomycin-C in order to reduce the risk of recurrence. We did discuss the potential side effects of this form of intravesical chemotherapy. The procedure will be performed under anesthesia as an outpatient.  Plan.   1.  His urine was cultured and grew Klebsiella.  It was resistant only to ampicillin and he was placed on Levaquin preoperatively.   2.  He will be scheduled for TURBT.   3.  I will tentatively plan to use postoperative mitomycin C intravesically.

## 2012-08-13 ENCOUNTER — Encounter (HOSPITAL_COMMUNITY): Payer: Self-pay | Admitting: *Deleted

## 2012-08-13 ENCOUNTER — Ambulatory Visit (HOSPITAL_COMMUNITY): Payer: 59 | Admitting: Anesthesiology

## 2012-08-13 ENCOUNTER — Encounter (HOSPITAL_COMMUNITY): Payer: Self-pay | Admitting: Anesthesiology

## 2012-08-13 ENCOUNTER — Encounter (HOSPITAL_COMMUNITY)
Admission: RE | Disposition: A | Discharge status: Home / Self Care | Payer: Self-pay | Source: Ambulatory Visit | Attending: Urology

## 2012-08-13 ENCOUNTER — Ambulatory Visit (INDEPENDENT_AMBULATORY_CARE_PROVIDER_SITE_OTHER): Payer: 59 | Admitting: *Deleted

## 2012-08-13 ENCOUNTER — Ambulatory Visit (HOSPITAL_COMMUNITY)
Admission: RE | Admit: 2012-08-13 | Discharge: 2012-08-13 | Disposition: A | Discharge status: Home / Self Care | Payer: 59 | Source: Ambulatory Visit | Attending: Urology | Admitting: Urology

## 2012-08-13 DIAGNOSIS — G4730 Sleep apnea, unspecified: Secondary | ICD-10-CM | POA: Insufficient documentation

## 2012-08-13 DIAGNOSIS — N289 Disorder of kidney and ureter, unspecified: Secondary | ICD-10-CM | POA: Insufficient documentation

## 2012-08-13 DIAGNOSIS — Z228 Carrier of other infectious diseases: Secondary | ICD-10-CM | POA: Insufficient documentation

## 2012-08-13 DIAGNOSIS — M109 Gout, unspecified: Secondary | ICD-10-CM | POA: Insufficient documentation

## 2012-08-13 DIAGNOSIS — N411 Chronic prostatitis: Secondary | ICD-10-CM | POA: Insufficient documentation

## 2012-08-13 DIAGNOSIS — C679 Malignant neoplasm of bladder, unspecified: Secondary | ICD-10-CM | POA: Insufficient documentation

## 2012-08-13 DIAGNOSIS — M545 Low back pain, unspecified: Secondary | ICD-10-CM | POA: Insufficient documentation

## 2012-08-13 DIAGNOSIS — Z95810 Presence of automatic (implantable) cardiac defibrillator: Secondary | ICD-10-CM

## 2012-08-13 DIAGNOSIS — Z79899 Other long term (current) drug therapy: Secondary | ICD-10-CM | POA: Insufficient documentation

## 2012-08-13 DIAGNOSIS — I428 Other cardiomyopathies: Secondary | ICD-10-CM

## 2012-08-13 DIAGNOSIS — I5022 Chronic systolic (congestive) heart failure: Secondary | ICD-10-CM

## 2012-08-13 DIAGNOSIS — N200 Calculus of kidney: Secondary | ICD-10-CM | POA: Insufficient documentation

## 2012-08-13 DIAGNOSIS — G8929 Other chronic pain: Secondary | ICD-10-CM | POA: Insufficient documentation

## 2012-08-13 DIAGNOSIS — I10 Essential (primary) hypertension: Secondary | ICD-10-CM | POA: Insufficient documentation

## 2012-08-13 HISTORY — PX: TRANSURETHRAL RESECTION OF BLADDER TUMOR: SHX2575

## 2012-08-13 SURGERY — TURBT (TRANSURETHRAL RESECTION OF BLADDER TUMOR)
Anesthesia: General | Wound class: Clean Contaminated

## 2012-08-13 MED ORDER — OXYCODONE HCL 5 MG/5ML PO SOLN: 5.0000 mg | Freq: Once | ORAL | Status: DC | PRN

## 2012-08-13 MED ORDER — HYDROCODONE-ACETAMINOPHEN 10-325 MG PO TABS: 1.0000 | ORAL_TABLET | ORAL | Status: DC | PRN

## 2012-08-13 MED ORDER — FENTANYL CITRATE 0.05 MG/ML IJ SOLN
INTRAMUSCULAR | Status: DC | PRN
  Administered 2012-08-13 (×2): 25 ug via INTRAVENOUS
  Administered 2012-08-13: 50 ug via INTRAVENOUS

## 2012-08-13 MED ORDER — HYDROMORPHONE HCL PF 1 MG/ML IJ SOLN: 0.2500 mg | INTRAMUSCULAR | Status: DC | PRN

## 2012-08-13 MED ORDER — LIDOCAINE HCL (CARDIAC) 20 MG/ML IV SOLN
INTRAVENOUS | Status: DC | PRN
  Administered 2012-08-13: 80 mg via INTRAVENOUS

## 2012-08-13 MED ORDER — PROMETHAZINE HCL 25 MG/ML IJ SOLN: 6.2500 mg | INTRAMUSCULAR | Status: DC | PRN

## 2012-08-13 MED ORDER — LACTATED RINGERS IV SOLN: INTRAVENOUS | Status: DC | PRN

## 2012-08-13 MED ORDER — EPHEDRINE SULFATE 50 MG/ML IJ SOLN
INTRAMUSCULAR | Status: DC | PRN
  Administered 2012-08-13 (×3): 10 mg via INTRAVENOUS

## 2012-08-13 MED ORDER — MIDAZOLAM HCL 5 MG/5ML IJ SOLN
INTRAMUSCULAR | Status: DC | PRN
  Administered 2012-08-13: 2 mg via INTRAVENOUS

## 2012-08-13 MED ORDER — MEPERIDINE HCL 50 MG/ML IJ SOLN: 6.2500 mg | INTRAMUSCULAR | Status: DC | PRN

## 2012-08-13 MED ORDER — LACTATED RINGERS IV SOLN
INTRAVENOUS | Status: DC | PRN
  Administered 2012-08-13: 10:00:00 via INTRAVENOUS

## 2012-08-13 MED ORDER — PHENAZOPYRIDINE HCL 200 MG PO TABS: 200.0000 mg | ORAL_TABLET | Freq: Three times a day (TID) | ORAL | Status: DC | PRN

## 2012-08-13 MED ORDER — ONDANSETRON HCL 4 MG/2ML IJ SOLN
INTRAMUSCULAR | Status: DC | PRN
  Administered 2012-08-13: 4 mg via INTRAVENOUS

## 2012-08-13 MED ORDER — OXYCODONE HCL 5 MG PO TABS: 5.0000 mg | ORAL_TABLET | Freq: Once | ORAL | Status: DC | PRN

## 2012-08-13 MED ORDER — ACETAMINOPHEN 10 MG/ML IV SOLN: 1000.0000 mg | Freq: Once | INTRAVENOUS | Status: DC | PRN

## 2012-08-13 MED ORDER — PROPOFOL 10 MG/ML IV BOLUS
INTRAVENOUS | Status: DC | PRN
  Administered 2012-08-13: 180 mg via INTRAVENOUS

## 2012-08-13 MED ORDER — MITOMYCIN CHEMO FOR BLADDER INSTILLATION 40 MG
40.0000 mg | INTRAVENOUS | Status: DC
  Filled 2012-08-13: qty 40

## 2012-08-13 MED ORDER — SODIUM CHLORIDE 0.9 % IR SOLN
Status: DC | PRN
  Administered 2012-08-13: 6000 mL via INTRAVESICAL

## 2012-08-13 SURGICAL SUPPLY — 22 items
BAG URINE DRAINAGE (UROLOGICAL SUPPLIES) ×2 IMPLANT
BAG URO CATCHER STRL LF (DRAPE) ×2 IMPLANT
CATH FOLEY 2WAY SLVR  5CC 20FR (CATHETERS) ×1
CATH FOLEY 2WAY SLVR 5CC 20FR (CATHETERS) ×1 IMPLANT
CLOTH BEACON ORANGE TIMEOUT ST (SAFETY) ×2 IMPLANT
DRAPE CAMERA CLOSED 9X96 (DRAPES) ×2 IMPLANT
ELECT LOOP MED HF 24F 12D CBL (CLIP) ×2 IMPLANT
ELECT REM PT RETURN 9FT ADLT (ELECTROSURGICAL) ×2
ELECTRODE REM PT RTRN 9FT ADLT (ELECTROSURGICAL) ×1 IMPLANT
EVACUATOR MICROVAS BLADDER (UROLOGICAL SUPPLIES) ×2 IMPLANT
GLOVE BIOGEL M 8.0 STRL (GLOVE) ×2 IMPLANT
GOWN PREVENTION PLUS XLARGE (GOWN DISPOSABLE) ×2 IMPLANT
GOWN STRL REIN XL XLG (GOWN DISPOSABLE) ×2 IMPLANT
HOLDER FOLEY CATH W/STRAP (MISCELLANEOUS) IMPLANT
JUMPSUIT BLUE BOOT COVER DISP (PROTECTIVE WEAR) IMPLANT
KIT ASPIRATION TUBING (SET/KITS/TRAYS/PACK) ×2 IMPLANT
LOOPS RESECTOSCOPE DISP (ELECTROSURGICAL) IMPLANT
MANIFOLD NEPTUNE II (INSTRUMENTS) ×2 IMPLANT
NS IRRIG 1000ML POUR BTL (IV SOLUTION) ×2 IMPLANT
PACK CYSTO (CUSTOM PROCEDURE TRAY) ×2 IMPLANT
SYRINGE IRR TOOMEY STRL 70CC (SYRINGE) IMPLANT
TUBING CONNECTING 10 (TUBING) ×2 IMPLANT

## 2012-08-13 NOTE — Anesthesia Postprocedure Evaluation (Signed)
Anesthesia Post Note  Patient: [name]  Procedure(s) Performed: Procedure(s) (LRB): TRANSURETHRAL RESECTION OF BLADDER TUMOR (TURBT) (N/A)  Anesthesia type: General  Patient location: PACU  Post pain: Pain level controlled  Post assessment: Post-op Vital signs reviewed  Last Vitals: BP 107/68  Pulse 90  Temp(Src) 36.4 C (Oral)  Resp 18  SpO2 99%  Post vital signs: Reviewed  Level of consciousness: sedated  Complications: No apparent anesthesia complications

## 2012-08-13 NOTE — Interval H&P Note (Signed)
History and Physical Interval Note:  08/13/2012 10:01 AM  [name]  has presented today for surgery, with the diagnosis of BLADDER TUMOR  The various methods of treatment have been discussed with the patient and family. After consideration of risks, benefits and other options for treatment, the patient has consented to  Procedure(s) with comments: TRANSURETHRAL RESECTION OF BLADDER TUMOR (TURBT), MITOMYCIN C (N/A) - MITOMYCIN C as a surgical intervention .  The patient's history has been reviewed, patient examined, no change in status, stable for surgery.  I have reviewed the patient's chart and labs.  Questions were answered to the patient's satisfaction.     Garnett Farm

## 2012-08-13 NOTE — Transfer of Care (Signed)
Immediate Anesthesia Transfer of Care Note  Patient: [name]  Procedure(s) Performed: Procedure(s) with comments: TRANSURETHRAL RESECTION OF BLADDER TUMOR (TURBT) (N/A) - MITOMYCIN C  Patient Location: PACU  Anesthesia Type:General  Level of Consciousness: awake and alert   Airway & Oxygen Therapy: Patient Spontanous Breathing and Patient connected to face mask oxygen  Post-op Assessment: Report given to PACU RN and Post -op Vital signs reviewed and stable  Post vital signs: Reviewed and stable  Complications: No apparent anesthesia complications

## 2012-08-13 NOTE — Anesthesia Preprocedure Evaluation (Addendum)
Anesthesia Evaluation  Patient identified by MRN, date of birth, ID band Patient awake    Reviewed: Allergy & Precautions, H&P , NPO status , Patient's Chart, lab work & pertinent test results, reviewed documented beta blocker date and time   Airway Mallampati: III TM Distance: >3 FB Neck ROM: Full    Dental  (+) Dental Advisory Given and Teeth Intact   Pulmonary shortness of breath and with exertion, sleep apnea ,  breath sounds clear to auscultation        Cardiovascular hypertension, Pt. on medications and Pt. on home beta blockers +CHF + dysrhythmias Ventricular Tachycardia + Cardiac Defibrillator Rhythm:Regular Rate:Normal  Echo 11/2011 - Left ventricle: The cavity size was severely dilated. Wall   thickness was normal. The estimated ejection fraction was  15%. Diffuse hypokinesis. Features are consistent with a  pseudonormal left ventricular filling pattern, with   concomitant abnormal relaxation and increased filling   pressure (grade 2 diastolic dysfunction). - Aortic valve: Trivial regurgitation. - Mitral valve: Mild regurgitation. - Left atrium: The atrium was moderately dilated.    Neuro/Psych negative neurological ROS  negative psych ROS   GI/Hepatic negative GI ROS, Neg liver ROS,   Endo/Other  Hyperthyroidism   Renal/GU Renal Insufficiency and CRFRenal disease     Musculoskeletal negative musculoskeletal ROS (+)   Abdominal (+) + obese,   Peds  Hematology negative hematology ROS (+)   Anesthesia Other Findings   Reproductive/Obstetrics                         Anesthesia Physical Anesthesia Plan  ASA: IV  Anesthesia Plan: General   Post-op Pain Management:    Induction: Intravenous  Airway Management Planned: LMA  Additional Equipment:   Intra-op Plan:   Post-operative Plan: Extubation in OR  Informed Consent: I have reviewed the patients History and Physical, chart,  labs and discussed the procedure including the risks, benefits and alternatives for the proposed anesthesia with the patient or authorized representative who has indicated his/her understanding and acceptance.   Dental advisory given  Plan Discussed with: CRNA  Anesthesia Plan Comments:        Anesthesia Quick Evaluation

## 2012-08-13 NOTE — Op Note (Signed)
PATIENT:  [name]  PRE-OPERATIVE DIAGNOSIS: Bladder tumor  POST-OPERATIVE DIAGNOSIS: Same  PROCEDURE:  Procedure(s): TRANSURETHRAL RESECTION OF BLADDER TUMOR (TURBT) (4cm.)  SURGEON:  Surgeon(s): Garnett Farm  ANESTHESIA:   General  EBL:  less than 50 mL  DRAINS: Urinary Catheter (20 Fr. Foley)   SPECIMEN:  Source of Specimen:  Bladder tumor  DISPOSITION OF SPECIMEN:  PATHOLOGY  Indication: Mr. Decarlo is a 52 year old male who was evaluated with a CT scan for hematuria. There appeared to be possible bladder lesion and this was confirmed by Dr. Brunilda Payor to be a bladder tumor 4 cm in size that was described as located at the right wall of the bladder close to the bladder neck. I therefore have discussed with the patient the need to resect the tumor. He understands and elected to proceed.  Description of operation: The patient was taken to the operating room and administered general anesthesia. He was then placed on the table and moved to the dorsal lithotomy position after which his genitalia was sterilely prepped and draped. An official timeout was then performed.  The 26 French resectoscope with Timberlake obturator was then introduced into the bladder and the obturator was removed. The resectoscope element with 12 lens was then inserted and the bladder was fully and systematically inspected. Ureteral orifices were noted to be in the normal anatomic positions. I was unable to identify any bladder tumor on the right wall of the bladder were the bladder neck. I looked circumferentially and no bladder tumor was identified. I therefore removed the resectoscope and inserted the cystoscope with the 70 lens. I was then able to identify the bladder tumor on the anterior wall of the bladder just inside the bladder neck. It could be seen with the 70 lens but not easily with the 12 lens. I therefore elected to use a 30 lens and with a great deal of suprapubic pressure in order to  displace the anterior wall of the bladder I was able to visualize a portion of the tumor.  I first began by resecting as much of the tumor as possible. I had to place the scope on an excessive amount of torque. I tried using the 12 lens initially and I was torquing the 12 lens a much that I actually broke the lens. I then used the 30 lens but although I was able to see the tumor a little bit better I still could not resect a significant amount of the tumor. I did resect some of the tumor for pathologic evaluation. There was some bleeding and this was fulgurated. It was impossible to resect all the tumor with the rigid scope. Using the flexible scope did not appear to be a good option do to the presence of bleeding and the large size of the tumor. I did resect away some of the prostate at the 12:00 position thinking that this might give me a better view of the tumor and it did help to a very small degree. The prostatic urethra seemed somewhat fixed and his obesity also made the ability to torque the scope and press the anterior wall of the bladder from the suprapubic region very difficult. There was no way to resect all the tumor and I felt that using the flexible cystoscope with the laser would not be effective and could potentially be hazardous and elected therefore not to try this. The Microvasive evacuator was then used to irrigate the bladder and remove all of the portions of bladder tumor  which were sent to pathology. I then removed the resectoscope.  A 20 French Foley catheter was then inserted in the bladder and irrigated. The irrigant returned slightly pink with no clots. The patient was awakened and taken to the recovery room.   PLAN OF CARE: Discharge to home after PACU  PATIENT DISPOSITION:  PACU - hemodynamically stable.

## 2012-08-14 ENCOUNTER — Encounter (HOSPITAL_COMMUNITY): Payer: Self-pay | Admitting: Urology

## 2012-08-18 ENCOUNTER — Encounter: Payer: Self-pay | Admitting: Internal Medicine

## 2012-08-22 ENCOUNTER — Encounter: Payer: Self-pay | Admitting: *Deleted

## 2012-08-26 LAB — REMOTE ICD DEVICE
AL IMPEDENCE ICD: 418 Ohm
AL THRESHOLD: 0.5 V
BATTERY VOLTAGE: 3.0844 V
CHARGE TIME: 3.913 s
LV LEAD IMPEDENCE ICD: 589 Ohm
RV LEAD AMPLITUDE: 20 mv
TOT-0001: 0
TOT-0002: 0
TOT-0006: 20140226000000
TZAT-0001ATACH: 1
TZAT-0001ATACH: 2
TZAT-0001FASTVT: 1
TZAT-0001FASTVT: 2
TZAT-0001SLOWVT: 2
TZAT-0002ATACH: NEGATIVE
TZAT-0002ATACH: NEGATIVE
TZAT-0002ATACH: NEGATIVE
TZAT-0004FASTVT: 8
TZAT-0004FASTVT: 8
TZAT-0004SLOWVT: 8
TZAT-0004SLOWVT: 8
TZAT-0005SLOWVT: 88 pct
TZAT-0005SLOWVT: 91 pct
TZAT-0012ATACH: 150 ms
TZAT-0012FASTVT: 170 ms
TZAT-0012SLOWVT: 170 ms
TZAT-0012SLOWVT: 170 ms
TZAT-0013FASTVT: 2
TZAT-0013FASTVT: 2
TZAT-0013SLOWVT: 2
TZAT-0013SLOWVT: 2
TZAT-0018ATACH: NEGATIVE
TZAT-0018ATACH: NEGATIVE
TZAT-0018FASTVT: NEGATIVE
TZAT-0018FASTVT: NEGATIVE
TZAT-0019ATACH: 6 V
TZAT-0019ATACH: 6 V
TZAT-0019FASTVT: 8 V
TZAT-0019FASTVT: 8 V
TZAT-0020ATACH: 1.5 ms
TZAT-0020FASTVT: 1.5 ms
TZAT-0020FASTVT: 1.5 ms
TZAT-0020SLOWVT: 1.5 ms
TZAT-0020SLOWVT: 1.5 ms
TZON-0003ATACH: 350 ms
TZON-0003SLOWVT: 410 ms
TZON-0004SLOWVT: 40
TZON-0004VSLOWVT: 44
TZST-0001ATACH: 4
TZST-0001FASTVT: 3
TZST-0001FASTVT: 5
TZST-0001SLOWVT: 3
TZST-0001SLOWVT: 4
TZST-0001SLOWVT: 5
TZST-0002ATACH: NEGATIVE
TZST-0002ATACH: NEGATIVE
TZST-0002SLOWVT: NEGATIVE
TZST-0003FASTVT: 35 J
TZST-0003FASTVT: 35 J
VENTRICULAR PACING ICD: 99.74 pct

## 2012-09-03 ENCOUNTER — Encounter (HOSPITAL_COMMUNITY): Payer: Self-pay | Admitting: Internal Medicine

## 2012-09-03 ENCOUNTER — Encounter: Payer: Self-pay | Admitting: *Deleted

## 2012-09-03 NOTE — Progress Notes (Signed)
Received request, signed authorization from pt, from [facility] requested surg clearance and all testing, records faxed to [place] at [phone]

## 2012-09-10 ENCOUNTER — Encounter: Payer: 59 | Admitting: *Deleted

## 2012-09-10 ENCOUNTER — Encounter: Payer: Self-pay | Admitting: *Deleted

## 2012-09-11 ENCOUNTER — Ambulatory Visit (INDEPENDENT_AMBULATORY_CARE_PROVIDER_SITE_OTHER)
Admission: RE | Admit: 2012-09-11 | Discharge: 2012-09-11 | Disposition: A | Discharge status: Home / Self Care | Payer: 59 | Source: Ambulatory Visit | Attending: Internal Medicine | Admitting: Internal Medicine

## 2012-09-11 ENCOUNTER — Encounter: Payer: Self-pay | Admitting: Internal Medicine

## 2012-09-11 ENCOUNTER — Ambulatory Visit (INDEPENDENT_AMBULATORY_CARE_PROVIDER_SITE_OTHER): Payer: 59 | Admitting: Internal Medicine

## 2012-09-11 ENCOUNTER — Telehealth: Payer: Self-pay | Admitting: *Deleted

## 2012-09-11 VITALS — BP 140/88 | HR 94 | Ht 70.0 in | Wt 257.0 lb

## 2012-09-11 DIAGNOSIS — R222 Localized swelling, mass and lump, trunk: Secondary | ICD-10-CM

## 2012-09-11 DIAGNOSIS — I509 Heart failure, unspecified: Secondary | ICD-10-CM

## 2012-09-11 DIAGNOSIS — Z95810 Presence of automatic (implantable) cardiac defibrillator: Secondary | ICD-10-CM

## 2012-09-11 DIAGNOSIS — R918 Other nonspecific abnormal finding of lung field: Secondary | ICD-10-CM

## 2012-09-11 DIAGNOSIS — I428 Other cardiomyopathies: Secondary | ICD-10-CM

## 2012-09-11 DIAGNOSIS — I4729 Other ventricular tachycardia: Secondary | ICD-10-CM

## 2012-09-11 DIAGNOSIS — I5042 Chronic combined systolic (congestive) and diastolic (congestive) heart failure: Secondary | ICD-10-CM

## 2012-09-11 DIAGNOSIS — Z01810 Encounter for preprocedural cardiovascular examination: Secondary | ICD-10-CM

## 2012-09-11 DIAGNOSIS — I472 Ventricular tachycardia: Secondary | ICD-10-CM

## 2012-09-11 LAB — ICD DEVICE OBSERVATION
AL AMPLITUDE: 2.9 mv
AL THRESHOLD: 0.5 V
DEV-0020ICD: NEGATIVE
LV LEAD THRESHOLD: 0.75 V
RV LEAD AMPLITUDE: 20 mv
TZAT-0001ATACH: 1
TZAT-0001ATACH: 3
TZAT-0002ATACH: NEGATIVE
TZAT-0011FASTVT: 10 ms
TZAT-0011SLOWVT: 10 ms
TZAT-0011SLOWVT: 10 ms
TZAT-0012ATACH: 150 ms
TZAT-0012ATACH: 150 ms
TZAT-0012SLOWVT: 170 ms
TZAT-0012SLOWVT: 170 ms
TZAT-0018ATACH: NEGATIVE
TZAT-0019ATACH: 6 V
TZAT-0019FASTVT: 8 V
TZAT-0019FASTVT: 8 V
TZAT-0019SLOWVT: 8 V
TZAT-0019SLOWVT: 8 V
TZAT-0020ATACH: 1.5 ms
TZAT-0020ATACH: 1.5 ms
TZAT-0020FASTVT: 1.5 ms
TZAT-0020FASTVT: 1.5 ms
TZON-0003ATACH: 350 ms
TZON-0003SLOWVT: 410 ms
TZON-0003VSLOWVT: 450 ms
TZON-0005SLOWVT: 12
TZST-0001ATACH: 4
TZST-0001ATACH: 6
TZST-0001FASTVT: 5
TZST-0001SLOWVT: 4
TZST-0001SLOWVT: 5
TZST-0001SLOWVT: 6
TZST-0002ATACH: NEGATIVE
TZST-0002ATACH: NEGATIVE
TZST-0002SLOWVT: NEGATIVE
TZST-0002SLOWVT: NEGATIVE
TZST-0003FASTVT: 35 J
TZST-0003FASTVT: 35 J

## 2012-09-11 NOTE — Patient Instructions (Signed)
You have care link transmission scheduled for December 17, 2012.  Your physician wants you to follow-up in:  12 months with Dr. Graciela Husbands.  You will receive a reminder letter in the mail two months in advance. If you don't receive a letter, please call our office to schedule the follow-up appointment.  Non-Cardiac CT scanning, (CAT scanning), is a noninvasive, special x-ray that produces cross-sectional images of the body using x-rays and a computer. CT scans help physicians diagnose and treat medical conditions. For some CT exams, a contrast material is used to enhance visibility in the area of the body being studied. CT scans provide greater clarity and reveal more details than regular x-ray exams. To be done today

## 2012-09-11 NOTE — Assessment & Plan Note (Addendum)
Noted previously. We will repeat this. Cr 1.4 and at its nadir we will start without contrast. He actually saw Dr. Sherene Sires for this 2010

## 2012-09-11 NOTE — Telephone Encounter (Signed)
Clearance addressed in office note from September 11, 2012.  Will fax when CT results available.

## 2012-09-11 NOTE — Assessment & Plan Note (Signed)
His cardiovascular function is relatively stable. 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 I think his risks for his urological surgery are probably acceptable. It will be important to keep him on his beta blockers.IV beta blockers can be used if he has recurrent ventricular tachycardia

## 2012-09-11 NOTE — Assessment & Plan Note (Signed)
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 There are at least 2 distinct morphologies.

## 2012-09-11 NOTE — Assessment & Plan Note (Signed)
The patient's device was interrogated.  The information was reviewed. No changes were made in the programming.    

## 2012-09-11 NOTE — Progress Notes (Signed)
Patient Care Team: Catha Gosselin, MD as PCP - General (Family Medicine) Duke Salvia, MD as PCP - Cardiology (Cardiology)   HPI  [name] is a 52 y.o. male is seen in followup for congestive heart failure in the setting of nonischemic cardiomyopathy. He is status post CRT-D. implantation. He had been previously treated for VT with amiodarone which was discontinued because of  lightheadedness He was seen in jan 2103 with flurries of VT requiring therapy which I had hoped might be attributable to mechanical-electric issues aggravated by CHF   He was hospitalized again 11/13 for multiple inappropriate ICD shocks attributed to hypokalemia secondary acute renal insufficiency and T wave oversensing. Rx with hemodialysis. The device was reprogrammed to decrease sensitivity from 0.3-0.6 mV and the NID was increased.   He also has OSA on CPAP. He is now being followed by the heart failure clinic Were CPX was much better than expected  He is scheduled to undergo urological surgery at [facility] as they were unable to access the concern here in [place]. He is somewhat frustrated by the delay. Preoperative chest x-ray there demonstrated a right hilar fullness. Review of the x-ray full there in [place] demonstrates a CT scan done most recently 2010 where this hilar mass was identified and described as nonspecific.   Past Medical History  Diagnosis Date  . Gout 08/03/12    PT C/O OF RIGHT KNEE PAIN AND SWELLING -STATES GOUT FLARE UP - ONGOING SINCE APRIL - BUT SWELLING W/IN LAST WEEK  . Cardiomyopathy     Idiopathic dilated;   Marland Kitchen Ventricular tachycardia     s/p ICD  . HTN (hypertension)   . Sleep apnea     CPAP  . Systolic CHF     EF 15%  . CKD (chronic kidney disease)     stage III baseline Crt 1.8-2.0  . Biventricular ICD (implantable cardiac defibrillator) Medtronic ]     DOI 2008/ upgrade 2010/ Gen Change 2014  . Automatic implantable cardioverter-defibrillator in situ   . CHF  (congestive heart failure)   . Bladder tumor     PT HOSP AT [facility] 4/24 TO 4/26 2014 WITH UTI--AND FOUND TO HAVE BLADDER TUMOR-  . Arthritis     GOUT    Past Surgical History  Procedure Laterality Date  . Cholecystectomy    . Cardiac catheterization      he was found to have normal coronary arteries but with a globally dilated and hypocontractile heart  . US echocardiography  03-17-2008, 07-03-2006    EF 15-20%, EF 15-20%  . Transurethral resection of bladder tumor N/A 08/13/2012    Procedure: TRANSURETHRAL RESECTION OF BLADDER TUMOR (TURBT);  Surgeon: Garnett Farm, MD;  Location: [facility] ORS;  Service: Urology;  Laterality: N/A;  MITOMYCIN C    Current Outpatient Prescriptions  Medication Sig Dispense Refill  . allopurinol (ZYLOPRIM) 300 MG tablet Take 300 mg by mouth daily. 08/03/12  PT STATES HE HAS GOUT TODAY - NOT TAKING ALLOPURINOL DURING GOUT ATTACK - PER HIS DOCTOR'S ORDERS.   LAST TOOK SOMETIME IN April 2014      . atorvastatin (LIPITOR) 40 MG tablet Take 40 mg by mouth daily. TAKES AT NIGHT      . carvedilol (COREG) 12.5 MG tablet Take 12.5-18.75 mg by mouth 2 (two) times daily with a meal. 1 tab in am, 1.5 tabs in pm      . colchicine 0.6 MG tablet Take 1 tablet (0.6 mg total) by mouth 2 (  two) times daily as needed (gout).  10 tablet  0  . digoxin (LANOXIN) 0.25 MG tablet Take 0.125 mg by mouth daily.       . furosemide (LASIX) 40 MG tablet Take 40 mg by mouth every Monday, Wednesday, and Friday.      . loratadine (CLARITIN) 10 MG tablet Take 1 tablet (10 mg total) by mouth daily.  30 tablet  0  . olmesartan (BENICAR) 20 MG tablet Take 20 mg by mouth every morning.      . phenazopyridine (PYRIDIUM) 200 MG tablet Take 1 tablet (200 mg total) by mouth 3 (three) times daily as needed for pain.  30 tablet  0  . tetrahydrozoline 0.05 % ophthalmic solution Place 1 drop into both eyes daily as needed.       No current facility-administered medications for this visit.    No Known  Allergies  Review of Systems negative except from HPI and PMH  Physical Exam BP 140/88  Pulse 94  Ht 5\' 10"  (1.778 m)  Wt 257 lb (116.574 kg)  BMI 36.88 kg/m2 Well developed and well nourished in no acute distress HENT normal E scleral and icterus clear Neck Supple JVP flat; carotids brisk and full Clear to ausculation  Regular rate and rhythm, no murmurs gallops or rub Soft with active bowel sounds No clubbing cyanosis none Edema Alert and oriented, grossly normal motor and sensory function Skin Warm and Dry    Assessment and  Plan

## 2012-09-11 NOTE — Telephone Encounter (Signed)
Received call from Earl Lagos at [facility]. Pt is scheduled for TURBT (trans urethral resection of bladder tumor) on July 18,2014. Carlisle Beers is calling to request cardiac clearance. She is also requesting copy of CT Scan done today. Phone--[phone].  Fax--[phone].

## 2012-09-12 NOTE — Telephone Encounter (Signed)
Office note and CT results faxed to the number provided.

## 2012-09-27 ENCOUNTER — Telehealth: Payer: Self-pay | Admitting: Pulmonary Disease

## 2012-09-27 ENCOUNTER — Telehealth: Payer: Self-pay | Admitting: *Deleted

## 2012-09-27 NOTE — Telephone Encounter (Signed)
Dr. Graciela Husbands called and spoke with Dr. Craige Cotta in pulmonary about his CT results. Per Dr. Craige Cotta, the patient should see Dr. Delton Coombes. They will call to schedule. Sherri Rad, RN, BSN  I called to relay information to the patient about him seeing pulmonary. The pulmonary department had already called to schedule his appointment. He will see Dr. Delton Coombes in the morning at 9:00 am. He did want me to relay to Dr. Graciela Husbands that [facility] was unable to get his bladder cancer during his procedure this time. No further course of treatment has been determined for this yet. Sherri Rad, RN, BSN

## 2012-09-27 NOTE — Telephone Encounter (Signed)
Appt scheduled for tomorrow 9am w RB Patient given direction to office and aware to arrive 15 min early. Nothing further needed at this time

## 2012-09-27 NOTE — Telephone Encounter (Signed)
Received call from Dr. Graciela Husbands.  Pt has large Rt paratracheal mass on CT chest as detailed below:  Ct Chest Wo Contrast  09/11/2012   *RADIOLOGY REPORT*   Clinical Data: Paratracheal mass on chest radiograph.  Preoperative for bladder surgery.   CT CHEST WITHOUT CONTRAST   Technique:  Multidetector CT imaging of the chest was performed following the standard protocol without IV contrast.   Comparison: 06/29/2012; 04/03/2008   Findings: Right paratracheal mass, 6.3 x 7.7 x 6.5 cm, formerly 3.7 x 4.9 x 5.5 cm on 04/03/2008.  This narrows the trachea.  Internal density 35 HU.  Lesion is likely having some degree of mass effect on the azygos vein, which is not well seen separate from this structure on today's noncontrast CT scan.  There appears to be intact fat plane between this lesion and the thyroid gland.  No additional mass/adenopathy in the chest. Cardiomegaly noted with AICD in place.  No adrenal mass identified.  Bilateral calcified granulomas are present in the lungs.  There is subsegmental atelectasis in the lingula.  Mild prominence of posterior epidural adipose tissue in the thoracic spine.   IMPRESSION:   1.  Right paratracheal mass has more the tripled in volume compared to the prior exam of 2010.  There is concern for lymphoma/malignancy.  Biopsy recommended.  2.  Cardiomegaly.  3.  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    Original Report Authenticated By: Gaylyn Rong, M.D.    He will need evaluation for bronchoscopy likely with EBUS.  Will have triage arrange for consultation appointment as soon as possible with Dr. Delton Coombes to further assess.

## 2012-09-27 NOTE — Telephone Encounter (Signed)
Please Inform Patient that CT is abnormal and will require further eval He was agoing to get this done at [facility] i think Thanks- SK  The patient is aware of his results. He is not having this followed at [facility]. Will review with Dr. Graciela Husbands to see who he feels should follow this patient and call him back. He is agreeable. Sherri Rad, RN, BSN

## 2012-09-28 ENCOUNTER — Ambulatory Visit (INDEPENDENT_AMBULATORY_CARE_PROVIDER_SITE_OTHER): Payer: 59 | Admitting: Emergency Medicine

## 2012-09-28 ENCOUNTER — Encounter: Payer: Self-pay | Admitting: Emergency Medicine

## 2012-09-28 ENCOUNTER — Encounter (HOSPITAL_COMMUNITY): Payer: Self-pay | Admitting: Pharmacy Technician

## 2012-09-28 ENCOUNTER — Encounter (HOSPITAL_COMMUNITY): Payer: Self-pay | Admitting: *Deleted

## 2012-09-28 VITALS — BP 118/80 | HR 94 | Ht 71.0 in | Wt 255.0 lb

## 2012-09-28 DIAGNOSIS — J9859 Other diseases of mediastinum, not elsewhere classified: Secondary | ICD-10-CM

## 2012-09-28 DIAGNOSIS — R222 Localized swelling, mass and lump, trunk: Secondary | ICD-10-CM

## 2012-09-28 NOTE — Assessment & Plan Note (Signed)
Enlarging R paratracheal mass, worrisome for lymphoma or other malignancy. Agree that he needs bx. Discussed the risks w him particularly regarding his CM. Best approach probably EBUS under general anesthesia. Will schedule.

## 2012-09-28 NOTE — Patient Instructions (Addendum)
We will arrange for a biopsy of your R paratracheal abnormality utilizing bronchoscopy and endobronchial ultrasound. This will be scheduled as soon as possible.  Follow with Dr Delton Coombes in 1 month

## 2012-09-28 NOTE — Progress Notes (Signed)
Subjective:    Patient ID: [name], male    DOB: 05/21/1960, 52 y.o.   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  He had a R paratracheal mass on Ct scan 2010 that was followed with repeat scanning and found to be stable in size 04/03/12. No interval scans are available until 09/27/12 >> shows significant increase in size in the lesion to 6.3 x 7.7 x 6.5cm. He is referred for possible bx. He feels asymptomatic - no dyspnea or changes in exertional tolerance. No f/c/sweats. No wt change. No change in appetite. He does have some nasal gtt, occasional wheeze. Good tolerance w CPAP.    Review of Systems  Constitutional: Negative for fever and unexpected weight change.  HENT: Negative for ear pain, nosebleeds, congestion, sore throat, rhinorrhea, sneezing, trouble swallowing, dental problem, postnasal drip and sinus pressure.   Eyes: Negative for redness and itching.  Respiratory: Negative for cough, chest tightness, shortness of breath and wheezing.   Cardiovascular: Negative for palpitations and leg swelling.  Gastrointestinal: Negative for nausea and vomiting.  Genitourinary: Negative for dysuria.  Musculoskeletal: Negative for joint swelling.  Skin: Negative for rash.  Neurological: Negative for headaches.  Hematological: Does not bruise/bleed easily.  Psychiatric/Behavioral: Negative for dysphoric mood. The patient is not nervous/anxious.    Past Medical History  Diagnosis Date  . Gout 08/03/12    PT C/O OF RIGHT KNEE PAIN AND SWELLING -STATES GOUT FLARE UP - ONGOING SINCE APRIL - BUT SWELLING W/IN LAST WEEK  . Cardiomyopathy     Idiopathic dilated;   . Ventricular tachycardia     s/p ICD  . HTN (hypertension)   . Sleep apnea     CPAP  . Systolic CHF     EF 15%  . CKD (chronic kidney disease)     stage III baseline Crt 1.8-2.0  .  Biventricular ICD (implantable cardiac defibrillator) Medtronic ]     DOI 2008/ upgrade 2010/ Gen Change 2014  . Automatic implantable cardioverter-defibrillator in situ   . CHF (congestive heart failure)   . Bladder tumor     PT HOSP AT [facility] 4/24 TO 4/26 2014 WITH UTI--AND FOUND TO HAVE BLADDER TUMOR-  . Arthritis     GOUT  . Hilar mass     Noted CT 2010     No family history on file.   History   Social History  . Marital Status: Married    Spouse Name: N/A    Number of Children: N/A  . Years of Education: N/A   Occupational History  . Not on file.   Social History Main Topics  . Smoking status: Never Smoker   . Smokeless tobacco: Never Used  . Alcohol Use: No  . Drug Use: No  . Sexually Active: No   Other Topics Concern  . Not on file   Social History Narrative  . No narrative on file     No Known Allergies   Outpatient Prescriptions Prior to Visit  Medication Sig Dispense Refill  . atorvastatin (LIPITOR) 40 MG tablet Take 40 mg by mouth daily. TAKES AT NIGHT      . carvedilol (COREG) 12.5 MG tablet Take 12.5-18.75 mg by mouth 2 (two) times daily with a meal. 1 tab in am, 1.5 tabs in pm      . colchicine 0.6 MG tablet Take 1   tablet (0.6 mg total) by mouth 2 (two) times daily as needed (gout).  10 tablet  0  . digoxin (LANOXIN) 0.25 MG tablet Take 0.125 mg by mouth daily.       . febuxostat (ULORIC) 40 MG tablet Take 80 mg by mouth daily.      . furosemide (LASIX) 40 MG tablet Take 40 mg by mouth every Monday, Wednesday, and Friday.      . olmesartan (BENICAR) 20 MG tablet Take 20 mg by mouth every morning.       No facility-administered medications prior to visit.         Objective:   Physical Exam Filed Vitals:   09/28/12 0909  BP: 118/80  Pulse: 94  Height: 5' 11" (1.803 m)  Weight: 255 lb (115.667 kg)  SpO2: 97%  Gen: Pleasant, well-nourished, in no distress,  normal affect  ENT: No lesions,  mouth clear,  oropharynx clear, no postnasal  drip  Neck: No JVD, no TMG, no carotid bruits  Lungs: No use of accessory muscles, no dullness to percussion, clear without rales or rhonchi  Cardiovascular: RRR, heart sounds normal, no murmur or gallops, no peripheral edema  Musculoskeletal: No deformities, no cyanosis or clubbing  Neuro: alert, non focal  Skin: Warm, no lesions or rashes     CT chest 09/27/12 --  Comparison: 06/29/2012; 04/03/2008  Findings: Right paratracheal mass, 6.3 x 7.7 x 6.5 cm, formerly 3.7  x 4.9 x 5.5 cm on 04/03/2008. This narrows the trachea. Internal  density 35 HU. Lesion is likely having some degree of mass effect  on the azygos vein, which is not well seen separate from this  structure on today's noncontrast CT scan.  There appears to be intact fat plane between this lesion and the  thyroid gland. No additional mass/adenopathy in the chest.  Cardiomegaly noted with AICD in place. No adrenal mass identified.  Bilateral calcified granulomas are present in the lungs. There is  subsegmental atelectasis in the lingula. Mild prominence of  posterior epidural adipose tissue in the thoracic spine.  IMPRESSION:  1. Right paratracheal mass has more the tripled in volume compared  to the prior exam of 2010. There is concern for  lymphoma/malignancy. Biopsy recommended.  2. Cardiomegaly.  3. 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     Assessment & Plan:  Mediastinal mass Enlarging R paratracheal mass, worrisome for lymphoma or other malignancy. Agree that he needs bx. Discussed the risks w him particularly regarding his CM. Best approach probably EBUS under general anesthesia. Will schedule.     

## 2012-09-28 NOTE — Progress Notes (Signed)
Perioperative Prescription for Implanted Cardiac  Device Programming sheet Faxed to Dr. Graciela Husbands this am 09/28/2012 at 11:46 am needing him to fill it out and FAX back to me. Message left on Device clinic voice mail to send order sheet back filled out.

## 2012-10-01 ENCOUNTER — Telehealth: Payer: Self-pay | Admitting: Emergency Medicine

## 2012-10-01 ENCOUNTER — Encounter (HOSPITAL_COMMUNITY): Payer: Self-pay | Admitting: Anesthesiology

## 2012-10-01 ENCOUNTER — Ambulatory Visit (HOSPITAL_COMMUNITY): Payer: 59 | Admitting: Anesthesiology

## 2012-10-01 ENCOUNTER — Ambulatory Visit (HOSPITAL_COMMUNITY)
Admission: RE | Admit: 2012-10-01 | Discharge: 2012-10-01 | Disposition: A | Discharge status: Home / Self Care | Payer: 59 | Source: Ambulatory Visit | Attending: Emergency Medicine | Admitting: Emergency Medicine

## 2012-10-01 ENCOUNTER — Encounter (HOSPITAL_COMMUNITY)
Admission: RE | Disposition: A | Discharge status: Home / Self Care | Payer: Self-pay | Source: Ambulatory Visit | Attending: Emergency Medicine

## 2012-10-01 ENCOUNTER — Encounter (HOSPITAL_COMMUNITY): Payer: Self-pay | Admitting: *Deleted

## 2012-10-01 DIAGNOSIS — I428 Other cardiomyopathies: Secondary | ICD-10-CM | POA: Insufficient documentation

## 2012-10-01 DIAGNOSIS — Z79899 Other long term (current) drug therapy: Secondary | ICD-10-CM | POA: Insufficient documentation

## 2012-10-01 DIAGNOSIS — N183 Chronic kidney disease, stage 3 unspecified: Secondary | ICD-10-CM | POA: Insufficient documentation

## 2012-10-01 DIAGNOSIS — R222 Localized swelling, mass and lump, trunk: Secondary | ICD-10-CM | POA: Insufficient documentation

## 2012-10-01 DIAGNOSIS — Z95810 Presence of automatic (implantable) cardiac defibrillator: Secondary | ICD-10-CM | POA: Insufficient documentation

## 2012-10-01 DIAGNOSIS — G4733 Obstructive sleep apnea (adult) (pediatric): Secondary | ICD-10-CM | POA: Insufficient documentation

## 2012-10-01 DIAGNOSIS — I129 Hypertensive chronic kidney disease with stage 1 through stage 4 chronic kidney disease, or unspecified chronic kidney disease: Secondary | ICD-10-CM | POA: Insufficient documentation

## 2012-10-01 DIAGNOSIS — J9859 Other diseases of mediastinum, not elsewhere classified: Secondary | ICD-10-CM | POA: Diagnosis present

## 2012-10-01 HISTORY — PX: ENDOBRONCHIAL ULTRASOUND: SHX5096

## 2012-10-01 LAB — BASIC METABOLIC PANEL
BUN: 22 mg/dL (ref 6–23)
Chloride: 105 mEq/L (ref 96–112)
Creatinine, Ser: 1.3 mg/dL (ref 0.50–1.35)
GFR calc non Af Amer: 61 mL/min — ABNORMAL LOW (ref >90)
Glucose, Bld: 104 mg/dL — ABNORMAL HIGH (ref 70–99)
Potassium: 5.2 mEq/L — ABNORMAL HIGH (ref 3.5–5.1)

## 2012-10-01 SURGERY — ENDOBRONCHIAL ULTRASOUND (EBUS)
Anesthesia: General | Laterality: Bilateral | Wound class: Clean Contaminated

## 2012-10-01 MED ORDER — SUCCINYLCHOLINE CHLORIDE 20 MG/ML IJ SOLN
INTRAMUSCULAR | Status: DC | PRN
  Administered 2012-10-01: 100 mg via INTRAVENOUS

## 2012-10-01 MED ORDER — LACTATED RINGERS IV SOLN
INTRAVENOUS | Status: DC
  Administered 2012-10-01: 10:00:00 via INTRAVENOUS

## 2012-10-01 MED ORDER — EPHEDRINE SULFATE 50 MG/ML IJ SOLN
INTRAMUSCULAR | Status: DC | PRN
  Administered 2012-10-01: 10 mg via INTRAVENOUS

## 2012-10-01 MED ORDER — PHENYLEPHRINE HCL 10 MG/ML IJ SOLN
INTRAMUSCULAR | Status: DC | PRN
  Administered 2012-10-01 (×2): 40 ug via INTRAVENOUS

## 2012-10-01 MED ORDER — PROPOFOL 10 MG/ML IV BOLUS
INTRAVENOUS | Status: DC | PRN
  Administered 2012-10-01: 80 mg via INTRAVENOUS
  Administered 2012-10-01: 20 mg via INTRAVENOUS

## 2012-10-01 MED ORDER — MEPERIDINE HCL 100 MG/ML IJ SOLN: 6.2500 mg | INTRAMUSCULAR | Status: DC | PRN

## 2012-10-01 MED ORDER — ONDANSETRON HCL 4 MG/2ML IJ SOLN
INTRAMUSCULAR | Status: DC | PRN
  Administered 2012-10-01: 4 mg via INTRAVENOUS

## 2012-10-01 MED ORDER — PROMETHAZINE HCL 25 MG/ML IJ SOLN: 6.2500 mg | INTRAMUSCULAR | Status: DC | PRN

## 2012-10-01 MED ORDER — REMIFENTANIL HCL 1 MG IV SOLR
INTRAVENOUS | Status: DC | PRN
  Administered 2012-10-01: .25 ug/kg/min via INTRAVENOUS

## 2012-10-01 NOTE — Telephone Encounter (Signed)
Will forward to [facility] please advise thanks

## 2012-10-01 NOTE — Telephone Encounter (Signed)
Shawna Orleans given ref# [id number] Tobe Sos

## 2012-10-01 NOTE — H&P (View-Only) (Signed)
Subjective:    Patient ID: [name], male    DOB: Nov 05, 1960, 52 y.o.   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  He had a R paratracheal mass on Ct scan 2010 that was followed with repeat scanning and found to be stable in size 04/03/12. No interval scans are available until 09/27/12 >> shows significant increase in size in the lesion to 6.3 x 7.7 x 6.5cm. He is referred for possible bx. He feels asymptomatic - no dyspnea or changes in exertional tolerance. No f/c/sweats. No wt change. No change in appetite. He does have some nasal gtt, occasional wheeze. Good tolerance w CPAP.    Review of Systems  Constitutional: Negative for fever and unexpected weight change.  HENT: Negative for ear pain, nosebleeds, congestion, sore throat, rhinorrhea, sneezing, trouble swallowing, dental problem, postnasal drip and sinus pressure.   Eyes: Negative for redness and itching.  Respiratory: Negative for cough, chest tightness, shortness of breath and wheezing.   Cardiovascular: Negative for palpitations and leg swelling.  Gastrointestinal: Negative for nausea and vomiting.  Genitourinary: Negative for dysuria.  Musculoskeletal: Negative for joint swelling.  Skin: Negative for rash.  Neurological: Negative for headaches.  Hematological: Does not bruise/bleed easily.  Psychiatric/Behavioral: Negative for dysphoric mood. The patient is not nervous/anxious.    Past Medical History  Diagnosis Date  . Gout 08/03/12    PT C/O OF RIGHT KNEE PAIN AND SWELLING -STATES GOUT FLARE UP - ONGOING SINCE APRIL - BUT SWELLING W/IN LAST WEEK  . Cardiomyopathy     Idiopathic dilated;   Marland Kitchen Ventricular tachycardia     s/p ICD  . HTN (hypertension)   . Sleep apnea     CPAP  . Systolic CHF     EF 15%  . CKD (chronic kidney disease)     stage III baseline Crt 1.8-2.0  .  Biventricular ICD (implantable cardiac defibrillator) Medtronic ]     DOI 2008/ upgrade 2010/ Gen Change 2014  . Automatic implantable cardioverter-defibrillator in situ   . CHF (congestive heart failure)   . Bladder tumor     PT HOSP AT [facility] 4/24 TO 4/26 2014 WITH UTI--AND FOUND TO HAVE BLADDER TUMOR-  . Arthritis     GOUT  . Hilar mass     Noted CT 2010     No family history on file.   History   Social History  . Marital Status: Married    Spouse Name: N/A    Number of Children: N/A  . Years of Education: N/A   Occupational History  . Not on file.   Social History Main Topics  . Smoking status: Never Smoker   . Smokeless tobacco: Never Used  . Alcohol Use: No  . Drug Use: No  . Sexually Active: No   Other Topics Concern  . Not on file   Social History Narrative  . No narrative on file     No Known Allergies   Outpatient Prescriptions Prior to Visit  Medication Sig Dispense Refill  . atorvastatin (LIPITOR) 40 MG tablet Take 40 mg by mouth daily. TAKES AT NIGHT      . carvedilol (COREG) 12.5 MG tablet Take 12.5-18.75 mg by mouth 2 (two) times daily with a meal. 1 tab in am, 1.5 tabs in pm      . colchicine 0.6 MG tablet Take 1  tablet (0.6 mg total) by mouth 2 (two) times daily as needed (gout).  10 tablet  0  . digoxin (LANOXIN) 0.25 MG tablet Take 0.125 mg by mouth daily.       . febuxostat (ULORIC) 40 MG tablet Take 80 mg by mouth daily.      . furosemide (LASIX) 40 MG tablet Take 40 mg by mouth every Monday, Wednesday, and Friday.      . olmesartan (BENICAR) 20 MG tablet Take 20 mg by mouth every morning.       No facility-administered medications prior to visit.         Objective:   Physical Exam Filed Vitals:   09/28/12 0909  BP: 118/80  Pulse: 94  Height: 5\' 11"  (1.803 m)  Weight: 255 lb (115.667 kg)  SpO2: 97%  Gen: Pleasant, well-nourished, in no distress,  normal affect  ENT: No lesions,  mouth clear,  oropharynx clear, no postnasal  drip  Neck: No JVD, no TMG, no carotid bruits  Lungs: No use of accessory muscles, no dullness to percussion, clear without rales or rhonchi  Cardiovascular: RRR, heart sounds normal, no murmur or gallops, no peripheral edema  Musculoskeletal: No deformities, no cyanosis or clubbing  Neuro: alert, non focal  Skin: Warm, no lesions or rashes     CT chest 09/27/12 --  Comparison: 06/29/2012; 04/03/2008  Findings: Right paratracheal mass, 6.3 x 7.7 x 6.5 cm, formerly 3.7  x 4.9 x 5.5 cm on 04/03/2008. This narrows the trachea. Internal  density 35 HU. Lesion is likely having some degree of mass effect  on the azygos vein, which is not well seen separate from this  structure on today's noncontrast CT scan.  There appears to be intact fat plane between this lesion and the  thyroid gland. No additional mass/adenopathy in the chest.  Cardiomegaly noted with AICD in place. No adrenal mass identified.  Bilateral calcified granulomas are present in the lungs. There is  subsegmental atelectasis in the lingula. Mild prominence of  posterior epidural adipose tissue in the thoracic spine.  IMPRESSION:  1. Right paratracheal mass has more the tripled in volume compared  to the prior exam of 2010. There is concern for  lymphoma/malignancy. Biopsy recommended.  2. Cardiomegaly.  3. 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     Assessment & Plan:  Mediastinal mass Enlarging R paratracheal mass, worrisome for lymphoma or other malignancy. Agree that he needs bx. Discussed the risks w him particularly regarding his CM. Best approach probably EBUS under general anesthesia. Will schedule.

## 2012-10-01 NOTE — Transfer of Care (Signed)
Immediate Anesthesia Transfer of Care Note  Patient: [name]  Procedure(s) Performed: Procedure(s) (LRB): ENDOBRONCHIAL ULTRASOUND (Bilateral)  Patient Location: PACU  Anesthesia Type: General  Level of Consciousness: sedated, patient cooperative and responds to stimulaton  Airway & Oxygen Therapy: Patient Spontanous Breathing and Patient connected to face mask oxgen  Post-op Assessment: Report given to PACU RN and Post -op Vital signs reviewed and stable  Post vital signs: Reviewed and stable  Complications: No apparent anesthesia complications

## 2012-10-01 NOTE — Anesthesia Preprocedure Evaluation (Addendum)
Anesthesia Evaluation  Patient identified by MRN, date of birth, ID band Patient awake    Reviewed: Allergy & Precautions, H&P , NPO status , Patient's Chart, lab work & pertinent test results, reviewed documented beta blocker date and time   Airway Mallampati: III TM Distance: >3 FB Neck ROM: Full    Dental  (+) Dental Advisory Given and Teeth Intact   Pulmonary shortness of breath and with exertion, sleep apnea ,  breath sounds clear to auscultation        Cardiovascular hypertension, Pt. on medications and Pt. on home beta blockers +CHF + dysrhythmias Ventricular Tachycardia + Cardiac Defibrillator Rhythm:Regular Rate:Normal  Echo 11/2011 - Left ventricle: The cavity size was severely dilated. Wall   thickness was normal. The estimated ejection fraction was  15%. Diffuse hypokinesis. Features are consistent with a  pseudonormal left ventricular filling pattern, with   concomitant abnormal relaxation and increased filling   pressure (grade 2 diastolic dysfunction). - Aortic valve: Trivial regurgitation. - Mitral valve: Mild regurgitation. - Left atrium: The atrium was moderately dilated.    Neuro/Psych negative neurological ROS  negative psych ROS   GI/Hepatic negative GI ROS, Neg liver ROS,   Endo/Other  Hyperthyroidism   Renal/GU Renal Insufficiency and CRFRenal disease     Musculoskeletal negative musculoskeletal ROS (+)   Abdominal (+) + obese,   Peds  Hematology negative hematology ROS (+)   Anesthesia Other Findings   Reproductive/Obstetrics                           Anesthesia Physical Anesthesia Plan  ASA: IV  Anesthesia Plan: General   Post-op Pain Management:    Induction: Intravenous  Airway Management Planned: Oral ETT and Video Laryngoscope Planned  Additional Equipment:   Intra-op Plan:   Post-operative Plan: Extubation in OR  Informed Consent: I have reviewed  the patients History and Physical, chart, labs and discussed the procedure including the risks, benefits and alternatives for the proposed anesthesia with the patient or authorized representative who has indicated his/her understanding and acceptance.   Dental advisory given  Plan Discussed with:   Anesthesia Plan Comments: (Paratracheal mass with narrowing of trachea and mass effect. Glidescope and small tubes available.)       Anesthesia Quick Evaluation

## 2012-10-01 NOTE — Interval H&P Note (Signed)
PCCM Interval Note  Pt without new issues except his gout is flaring in his knees. Understands procedure.   Filed Vitals:   09/28/12 1427 10/01/12 0900  BP:  138/88  Pulse:  88  Temp:  98.4 F (36.9 C)  TempSrc:  Oral  Resp:  18  Height: 5\' 11"  (1.803 m)   Weight: 114.76 kg (253 lb)   SpO2:  98%    Recent Labs Lab 10/01/12 0910  INR 0.98   Exam - no crackles CV - distant heart sounds Ext - no edema  Plans:  - will proceed with EBUS to isolate and biopsy paratracheal mass, will use high ETT - magnet available given hx AICD - note hx cardiomyopathy, discussed with anesthesia  Levy Pupa, MD, PhD 10/01/2012, 9:56 AM [facility] [phone] or if no answer [phone]

## 2012-10-01 NOTE — Anesthesia Postprocedure Evaluation (Signed)
  Anesthesia Post-op Note  Patient: [name]  Procedure(s) Performed: Procedure(s) (LRB): ENDOBRONCHIAL ULTRASOUND (Bilateral)  Patient Location: PACU  Anesthesia Type: General  Level of Consciousness: awake and alert   Airway and Oxygen Therapy: Patient Spontanous Breathing  Post-op Pain: mild  Post-op Assessment: Post-op Vital signs reviewed, Patient's Cardiovascular Status Stable, Respiratory Function Stable, Patent Airway and No signs of Nausea or vomiting  Last Vitals:  Filed Vitals:   10/01/12 1250  BP: 113/66  Pulse:   Temp:   Resp: 19    Post-op Vital Signs: stable   Complications: No apparent anesthesia complications

## 2012-10-01 NOTE — Op Note (Signed)
Video Bronchoscopy with Endobronchial Ultrasound Procedure Note  Date of Operation: 10/01/2012  Pre-op Diagnosis: right paratracheal mass   Post-op Diagnosis: same  Surgeon: Levy Pupa  Assistants: none  Anesthesia: General endotracheal anesthesia  Operation: Flexible video fiberoptic bronchoscopy with endobronchial ultrasound and biopsies.  Estimated Blood Loss: 30cc  Complications: none apparent  Indications and History: [name] is a 52 y.o. male with an enlarging right peritracheal mass noted on CT scan of the chest 09/27/12.  Recommendation was made to obtain needle biopsies utilizing bronchoscopy with endobronchial ultrasound. The risks, benefits, complications, treatment options and expected outcomes were discussed with the patient.  The possibilities of pneumothorax, pneumonia, reaction to medication, pulmonary aspiration, perforation of a viscus, bleeding, failure to diagnose a condition and creating a complication requiring transfusion or operation were discussed with the patient who freely signed the consent.    Description of Procedure: The patient was examined in the preoperative area and history and data from the preprocedure consultation were reviewed. It was deemed appropriate to proceed. His medical issues including his cardiomyopathy were discussed with the anesthesiologist. A magnet was available given his history of AICD. The patient was taken to [facility] endoscopy, identified as [name] and the procedure verified as Flexible Video Fiberoptic Bronchoscopy.  A Time Out was held and the above information confirmed. General anesthesia was initiated and the patient  was orally intubated. The video fiberoptic bronchoscope was introduced via the endotracheal tube and a general inspection was performed which showed narrowing of the mid-trachea with some leftward deviation. There was no apparent endobronchial lesion. Inspection of the distal airways showed no  abnormalities. The standard scope was then withdrawn and the endobronchial ultrasound was used to identify and characterize the right peritracheal mass. Inspection showed a large rounded mass in the predicted location. Doppler assessment showed multiple small vessels within the mass.. Using real-time ultrasound guidance Wang needle biopsies were take from the right peritracheal mass and were sent for cytology and FLOW cytometry. The patient tolerated the procedure well without apparent complications. There was no significant blood loss. The bronchoscope was withdrawn. Anesthesia was reversed and the patient was taken to the PACU for recovery.   Samples: Wang needle biopsies from right paratracheal mass  Plans:  The patient will be discharged from the PACU to home when recovered from anesthesia. We will review the cytology and FLOW cytometry results with the patient when they become available. Outpatient followup will be with Dr Delton Coombes.   Levy Pupa, MD, PhD 10/01/2012, 11:15 AM [facility] [phone] or if no answer [phone]

## 2012-10-02 ENCOUNTER — Encounter (HOSPITAL_COMMUNITY): Payer: Self-pay | Admitting: Emergency Medicine

## 2012-10-02 ENCOUNTER — Telehealth: Payer: Self-pay | Admitting: Emergency Medicine

## 2012-10-02 ENCOUNTER — Encounter: Payer: Self-pay | Admitting: *Deleted

## 2012-10-02 NOTE — Telephone Encounter (Signed)
Pt returned call. [name] °

## 2012-10-02 NOTE — Telephone Encounter (Signed)
lmtcb x1 for pt. 

## 2012-10-02 NOTE — Telephone Encounter (Signed)
Letter has been written. DOB has been taken off her pt's request. He will come by tomorrow to pick it up.

## 2012-10-04 ENCOUNTER — Telehealth: Payer: Self-pay | Admitting: Emergency Medicine

## 2012-10-04 DIAGNOSIS — J9859 Other diseases of mediastinum, not elsewhere classified: Secondary | ICD-10-CM

## 2012-10-04 NOTE — Telephone Encounter (Signed)
Discussed results with pt. Atypical cells but unable to specify any further. I believe he needs to be considered for mediastinoscopy. I got several good samples by EBUS and don't believe we could do better if we repeated this. I will refer him to see TCTS. Explained this to pt, he agrees with the plan.

## 2012-10-08 ENCOUNTER — Encounter (HOSPITAL_COMMUNITY): Payer: Self-pay

## 2012-10-08 ENCOUNTER — Ambulatory Visit (HOSPITAL_COMMUNITY)
Admission: RE | Admit: 2012-10-08 | Discharge: 2012-10-08 | Disposition: A | Discharge status: Home / Self Care | Payer: 59 | Source: Ambulatory Visit | Attending: Internal Medicine | Admitting: Internal Medicine

## 2012-10-08 VITALS — BP 100/82 | HR 102 | Wt 252.1 lb

## 2012-10-08 DIAGNOSIS — N183 Chronic kidney disease, stage 3 unspecified: Secondary | ICD-10-CM | POA: Insufficient documentation

## 2012-10-08 DIAGNOSIS — I428 Other cardiomyopathies: Secondary | ICD-10-CM | POA: Insufficient documentation

## 2012-10-08 DIAGNOSIS — M109 Gout, unspecified: Secondary | ICD-10-CM | POA: Insufficient documentation

## 2012-10-08 DIAGNOSIS — I509 Heart failure, unspecified: Secondary | ICD-10-CM | POA: Insufficient documentation

## 2012-10-08 DIAGNOSIS — G479 Sleep disorder, unspecified: Secondary | ICD-10-CM | POA: Insufficient documentation

## 2012-10-08 DIAGNOSIS — I5022 Chronic systolic (congestive) heart failure: Secondary | ICD-10-CM | POA: Insufficient documentation

## 2012-10-08 DIAGNOSIS — Z95810 Presence of automatic (implantable) cardiac defibrillator: Secondary | ICD-10-CM | POA: Insufficient documentation

## 2012-10-08 DIAGNOSIS — G4733 Obstructive sleep apnea (adult) (pediatric): Secondary | ICD-10-CM | POA: Insufficient documentation

## 2012-10-08 DIAGNOSIS — I5042 Chronic combined systolic (congestive) and diastolic (congestive) heart failure: Secondary | ICD-10-CM

## 2012-10-08 DIAGNOSIS — I129 Hypertensive chronic kidney disease with stage 1 through stage 4 chronic kidney disease, or unspecified chronic kidney disease: Secondary | ICD-10-CM | POA: Insufficient documentation

## 2012-10-08 DIAGNOSIS — M129 Arthropathy, unspecified: Secondary | ICD-10-CM | POA: Insufficient documentation

## 2012-10-08 LAB — BASIC METABOLIC PANEL
BUN: 19 mg/dL (ref 6–23)
CO2: 23 mEq/L (ref 19–32)
Chloride: 103 mEq/L (ref 96–112)
Creatinine, Ser: 1.28 mg/dL (ref 0.50–1.35)

## 2012-10-08 MED ORDER — CARVEDILOL 12.5 MG PO TABS: 18.7500 mg | ORAL_TABLET | Freq: Two times a day (BID) | ORAL | Status: DC

## 2012-10-08 NOTE — Progress Notes (Signed)
PCP: Dr Clarene Duke  Nephrologist: Dr [name]  Cardiologist: Dr Graciela Husbands  HPI: [name] is a 52 year old with a PMH of gout, nonischemic cardiomyopathy (EF 15%) S/P CRT-D implantation 2010, VT with amiodarone which was discontinued because of lightheadedness. He also has prostatitis, and OSA (CPAP). LHC 2008: Normal cors.  He is intoelrant to numerous HF meds including spiro (severe hyperkalemia - requiring HD), hydralazine (fuzzy-headed) and imdur (severe HAs).   RHC 01/28/12  RA = 1  RV = 28/2/4  PA = 32/17 (22)  PCW = 6  Fick cardiac output/index = 5.1/2.2  Thermo cardiac output/index = 5.0/2.1  PVR = 4.0 Woods  FA sat = 98%  PA sat = 64%, 68%   11/29/11 ECHO EF 15% Diffuse hypokinesis. Features are consistent with a pseudonormal left ventricular filling pattern, with concomitant abnormal relaxation and increased filling pressure (grade 2 diastolic dysfunction).  Admitted to [facility] 01/26/2012 after his ICD fired 5 times Potassium >7.5 Required urgent dialysis. Discharged form [facility] 01/29/12. Discharge weight 250 pounds.   CPX test 12/13  Resting HR: 90 Peak HR: 150 % age predicted max HR: 89% BP rest: 121/88 BP peak: 189/112 Peak VO2: 15.2 % predicted peak VO2: 58.3% (When adjusted to the patient's ideal body weight of 176 lb (79.8 kg) the peak VO2 is 22 ml/kg (ibw)/min (63% of the ibw-adjusted predicted) VE/VCO2 slope: 26.4 OUES: 2.29 Peak RER: 1.05 Ventilatory Threshold: 12.3 % predicted peak VO2: 47.2%  Follow up: Went to Dr. Brunilda Payor for cystoscopy, however they were not able to get the mass. Saw Dr.Bryum and had EBUS which showed atypical cells and is being referred to [facility] for possible mediastinoscopy. Reports fatigue. + flank pain. Denies SOB, orthopnea, or CP. Weight at home 252-255 lbs. Taking medications as prescribed.    ROS: All systems negative except as listed in HPI, PMH and Problem List.  Past Medical History  Diagnosis Date  . Gout 08/03/12    PT C/O OF RIGHT KNEE PAIN  AND SWELLING -STATES GOUT FLARE UP - ONGOING SINCE APRIL - BUT SWELLING W/IN LAST WEEK  . Cardiomyopathy     Idiopathic dilated;   Marland Kitchen Ventricular tachycardia     s/p ICD  . HTN (hypertension)   . Sleep apnea     CPAP  . Systolic CHF     EF 15%  . CKD (chronic kidney disease)     stage III baseline Crt 1.8-2.0  . Biventricular ICD (implantable cardiac defibrillator) Medtronic ]     DOI 2008/ upgrade 2010/ Gen Change 2014  . Automatic implantable cardioverter-defibrillator in situ   . CHF (congestive heart failure)   . Bladder tumor     PT HOSP AT [facility] 4/24 TO 4/26 2014 WITH UTI--AND FOUND TO HAVE BLADDER TUMOR-  . Arthritis     GOUT  . Hilar mass     Noted CT 2010    Current Outpatient Prescriptions  Medication Sig Dispense Refill  . atorvastatin (LIPITOR) 40 MG tablet Take 40 mg by mouth at bedtime.       . carvedilol (COREG) 12.5 MG tablet Take 12.5-18.75 mg by mouth 2 (two) times daily with a meal. 1 tab in am, 1.5 tabs in pm      . colchicine 0.6 MG tablet Take 1 tablet (0.6 mg total) by mouth 2 (two) times daily as needed (gout).  10 tablet  0  . digoxin (LANOXIN) 0.25 MG tablet Take 0.125 mg by mouth daily.       Marland Kitchen  febuxostat (ULORIC) 40 MG tablet Take 80 mg by mouth daily.      . furosemide (LASIX) 40 MG tablet Take 40 mg by mouth every Monday, Wednesday, and Friday.      . olmesartan (BENICAR) 20 MG tablet Take 20 mg by mouth every morning.       No current facility-administered medications for this encounter.    PHYSICAL EXAM: Filed Vitals:   10/08/12 1320  BP: 100/82  Pulse: 102  Weight: 252 lb 1.9 oz (114.361 kg)  SpO2: 98%   General:  Well appearing. No resp difficulty HEENT: normal Neck: supple. JVP flat. Carotids 2+ bilaterally; no bruits. No lymphadenopathy or thryomegaly appreciated. Cor: PMI normal. Regular rate & rhythm. No rubs, gallops or murmurs. Lungs: clear Abdomen: soft, nontender, nondistended. No hepatosplenomegaly. No bruits or masses. Good  bowel sounds. Extremities: no cyanosis, clubbing, rash, edema Neuro: alert & orientedx3, cranial nerves grossly intact. Moves all 4 extremities w/o difficulty. Affect pleasant.   ASSESSMENT & PLAN:  1) Chronic systolic HF, NICM EF 15% - NYHA II. Volume status good.  - Will recheck BMET today, since K+ was slightly elevated last week 5.2 - Will increase am dose of carvedilol to 18.75 mg and continue pm dose of 18.75. Instructed patient if he notices increased dizziness or fatigue to call. - Follow up in 2 months with ECHO. - Reinforced the need and importance of daily weights, a low sodium diet, and fluid restriction (less than 2 L a day). Instructed to call the HF clinic if weight increases more than 3 lbs overnight or 5 lbs in a week.   2) OSA -Continue CPAP.  Ulla Potash BNP-C 1:58 PM

## 2012-10-08 NOTE — Patient Instructions (Addendum)
Increase carvedilol to 18.75 (1 1/2 tablets) in the am and pm.  Follow up in 2 months with ECHO.  Call if notice any increase fatigue or dizziness.  Will call with lab results.  Do the following things EVERYDAY: 1) Weigh yourself in the morning before breakfast. Write it down and keep it in a log. 2) Take your medicines as prescribed 3) Eat low salt foods-Limit salt (sodium) to 2000 mg per day.  4) Stay as active as you can everyday 5) Limit all fluids for the day to less than 2 liters

## 2012-10-08 NOTE — Addendum Note (Signed)
Encounter addended by: Aundria Rud, NP on: 10/08/2012  2:27 PM<BR>     Documentation filed: Orders

## 2012-10-09 ENCOUNTER — Telehealth: Payer: Self-pay | Admitting: Emergency Medicine

## 2012-10-09 NOTE — Telephone Encounter (Signed)
Pt is wanting the results of the recent procedure he had done to another MD. Dr. Satira Mccallum - (fax [phone]) Attn: Frazier Butt

## 2012-10-10 NOTE — Telephone Encounter (Signed)
Results have been printed and faxed to Dr. Toma CopierFrazier Butt at number below Nothing further needed at this time

## 2012-10-10 NOTE — Telephone Encounter (Signed)
Pt returned call. He stated that he wants the biopsy / pathology report sent to to dr pruthi. [name]

## 2012-10-10 NOTE — Telephone Encounter (Signed)
ATC patient to verify which results he is requesting be sent to Dr. Satira Mccallum No answer, [facility]

## 2012-10-17 ENCOUNTER — Encounter: Payer: Self-pay | Admitting: Cardiothoracic Surgery

## 2012-10-17 ENCOUNTER — Other Ambulatory Visit: Payer: Self-pay | Admitting: *Deleted

## 2012-10-17 ENCOUNTER — Institutional Professional Consult (permissible substitution) (INDEPENDENT_AMBULATORY_CARE_PROVIDER_SITE_OTHER): Payer: 59 | Admitting: Cardiothoracic Surgery

## 2012-10-17 ENCOUNTER — Encounter: Payer: 59 | Admitting: Surgery

## 2012-10-17 VITALS — BP 140/86 | HR 92 | Resp 20 | Ht 71.0 in | Wt 252.0 lb

## 2012-10-17 DIAGNOSIS — J398 Other specified diseases of upper respiratory tract: Secondary | ICD-10-CM

## 2012-10-17 DIAGNOSIS — J9859 Other diseases of mediastinum, not elsewhere classified: Secondary | ICD-10-CM

## 2012-10-17 DIAGNOSIS — R222 Localized swelling, mass and lump, trunk: Secondary | ICD-10-CM

## 2012-10-17 NOTE — Progress Notes (Signed)
PCP is Mickie Hillier, MD Referring Provider is Catha Gosselin, MD  Chief Complaint  Patient presents with  . Mediastinal Mass    surgical eval on mediastinal mass, EBUS 10/01/12, Chest CT 09/11/12   patient examined, CT scan and medical record reviewed  HPI: Very nice 52 year old Afro-American male with idiopathic cardiomyopathy follow the heart failure clinic for several years with EF of 15% with maintained normal cardiac output and normal right heart pressures on medical therapy. AICD placed for ventricular arrhythmias has not shock or several months.  Patient presents today for evaluation of a large right paratracheal mass which was first seen on CT scan in 2008 with little change in 2010. However recent CT scan shows a significant change and tripled in size of the mass which is fairly solid and has some mild external compression of the trachea. E BUS perform a Dr. Delton Coombes showed atypical cells which were nondiagnostic. There is concern this represents a lymphoma and mediastinoscopy was recommended.  The patient denies any systemic symptoms, no chest pain no fever, no weight loss. Patient still able to work full-time for the [employer] division .  The patient is a nonsmoker. No family history lung cancer. No endobronchial lesions noted at the time of EBUS  Patient also has obstructive sleep apnea Past Medical History  Diagnosis Date  . Gout 08/03/12    PT C/O OF RIGHT KNEE PAIN AND SWELLING -STATES GOUT FLARE UP - ONGOING SINCE APRIL - BUT SWELLING W/IN LAST WEEK  . Cardiomyopathy     Idiopathic dilated;   Marland Kitchen Ventricular tachycardia     s/p ICD  . HTN (hypertension)   . Sleep apnea     CPAP  . Systolic CHF     EF 15%  . CKD (chronic kidney disease)     stage III baseline Crt 1.8-2.0  . Biventricular ICD (implantable cardiac defibrillator) Medtronic ]     DOI 2008/ upgrade 2010/ Gen Change 2014  . Automatic implantable cardioverter-defibrillator in situ   .  CHF (congestive heart failure)   . Bladder tumor     PT HOSP AT [facility] 4/24 TO 4/26 2014 WITH UTI--AND FOUND TO HAVE BLADDER TUMOR-  . Arthritis     GOUT  . Hilar mass     Noted CT 2010    Past Surgical History  Procedure Laterality Date  . Cholecystectomy    . Cardiac catheterization      he was found to have normal coronary arteries but with a globally dilated and hypocontractile heart  . US echocardiography  03-17-2008, 07-03-2006    EF 15-20%, EF 15-20%  . Transurethral resection of bladder tumor N/A 08/13/2012    Procedure: TRANSURETHRAL RESECTION OF BLADDER TUMOR (TURBT);  Surgeon: Garnett Farm, MD;  Location: [facility];  Service: Urology;  Laterality: N/A;  MITOMYCIN C  . Icd insertion  04/2012-new ICD  . Endobronchial ultrasound Bilateral 10/01/2012    Procedure: ENDOBRONCHIAL ULTRASOUND;  Surgeon: Leslye Peer, MD;  Location: [facility] ENDOSCOPY;  Service: Cardiopulmonary;  Laterality: Bilateral;    No family history on file.  Social History History  Substance Use Topics  . Smoking status: Never Smoker   . Smokeless tobacco: Never Used  . Alcohol Use: No    Current Outpatient Prescriptions  Medication Sig Dispense Refill  . atorvastatin (LIPITOR) 40 MG tablet Take 40 mg by mouth at bedtime.       . carvedilol (COREG) 12.5 MG tablet Take 1.5 tablets (18.75 mg total)  by mouth 2 (two) times daily with a meal.  90 tablet  3  . colchicine 0.6 MG tablet Take 1 tablet (0.6 mg total) by mouth 2 (two) times daily as needed (gout).  10 tablet  0  . digoxin (LANOXIN) 0.25 MG tablet Take 0.125 mg by mouth daily.       . febuxostat (ULORIC) 40 MG tablet Take 80 mg by mouth daily.      . furosemide (LASIX) 40 MG tablet Take 40 mg by mouth every Monday, Wednesday, and Friday.      . olmesartan (BENICAR) 20 MG tablet Take 20 mg by mouth every morning.       No current facility-administered medications for this visit.    No Known Allergies  Review of Systems: No fever weight loss No  headache or vision changes,  No noted difficulty swallowing Patient has some thyroid enlargement, separate from the mediastinal mass No diabetes Idiopathic cardiomyopathy with compensated heart failure on oral medications followed by the heart failure clinic Bladder tumor being evaluated at [facility] department urology, concern for malignancy but biopsies not been completed No history stroke DVT claudication pulmonary emboli  BP 140/86  Pulse 92  Resp 20  Ht 5\' 11"  (1.803 m)  Wt 252 lb (114.306 kg)  BMI 35.16 kg/m2  SpO2 98% Physical Exam  Alert and comfortable-obese middle aged Afro-American male HEENT normocephalic dentition good Neck without palpable adenopathy or JVD no carotid bruit  Thorax without deformity breath sounds clear Cardiac regular rhythm, no gallop or murmur Abdomen soft nontender without mass Extremities without tenderness edema or cyanosis Neurologic no focal motor deficit Vascular palpable ulcers in the periphery    Diagnostic Tests: CT scan of chest shows enlarged 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 Patient understands indications benefits alternatives and risks and agrees to proceed. Patient will be kept overnight for 24 hour observation due to his cardiac status. Patient was recently seen in the heart failure clinic 10 days ago and felt to be stable.

## 2012-10-22 ENCOUNTER — Encounter (HOSPITAL_COMMUNITY): Payer: Self-pay | Admitting: Pharmacy Technician

## 2012-10-23 NOTE — Pre-Procedure Instructions (Signed)
[name]  10/23/2012   Your procedure is scheduled on: Thursday, August 22nd.  Report to [facility] at  5:30AM.  Call this number if you have problems the morning of surgery: [phone]   Remember:   Do not eat food or drink liquids after midnight.   Take these medicines the morning of surgery with A SIP OF WATER: Carvedilol (Coreg), Digoxin (Lanoxin).   Do not wear jewelry, make-up or nail polish.  Do not wear lotions, powders, or perfumes. You may wear deodorant.  Men may shave face and neck.  Do not bring valuables to the hospital.  [facility] is not responsible for any belongings or valuables.  Contacts, dentures or bridgework may not be worn into surgery.  Leave suitcase in the car. After surgery it may be brought to your room.  For patients admitted to the hospital, checkout time is 11:00 AM the day of discharge.   Patients discharged the day of surgery will not be allowed to drive home.  Name and phone number of your driver: Family/ Friend   Special Instructions: Shower with CHG wash (Bactoshield) tonight and again in the am prior to arriving to hospital.   Please read over the following fact sheets that you were given: Pain Booklet, Coughing and Deep Breathing, Blood Transfusion Information and Surgical Site Infection Prevention

## 2012-10-24 ENCOUNTER — Encounter (HOSPITAL_COMMUNITY): Payer: Self-pay

## 2012-10-24 ENCOUNTER — Ambulatory Visit (HOSPITAL_COMMUNITY)
Admission: RE | Admit: 2012-10-24 | Discharge: 2012-10-24 | Disposition: A | Discharge status: Home / Self Care | Payer: 59 | Source: Ambulatory Visit | Attending: Cardiothoracic Surgery | Admitting: Cardiothoracic Surgery

## 2012-10-24 ENCOUNTER — Encounter (HOSPITAL_COMMUNITY)
Admission: RE | Admit: 2012-10-24 | Discharge: 2012-10-24 | Disposition: A | Discharge status: Home / Self Care | Payer: 59 | Source: Ambulatory Visit | Attending: Cardiothoracic Surgery | Admitting: Cardiothoracic Surgery

## 2012-10-24 ENCOUNTER — Other Ambulatory Visit: Payer: Self-pay | Admitting: *Deleted

## 2012-10-24 VITALS — BP 124/85 | HR 88 | Temp 98.2°F | Resp 20 | Ht 71.0 in | Wt 256.1 lb

## 2012-10-24 DIAGNOSIS — I129 Hypertensive chronic kidney disease with stage 1 through stage 4 chronic kidney disease, or unspecified chronic kidney disease: Secondary | ICD-10-CM | POA: Insufficient documentation

## 2012-10-24 DIAGNOSIS — I080 Rheumatic disorders of both mitral and aortic valves: Secondary | ICD-10-CM | POA: Insufficient documentation

## 2012-10-24 DIAGNOSIS — I472 Ventricular tachycardia, unspecified: Secondary | ICD-10-CM | POA: Insufficient documentation

## 2012-10-24 DIAGNOSIS — J398 Other specified diseases of upper respiratory tract: Secondary | ICD-10-CM

## 2012-10-24 DIAGNOSIS — Z95810 Presence of automatic (implantable) cardiac defibrillator: Secondary | ICD-10-CM | POA: Insufficient documentation

## 2012-10-24 DIAGNOSIS — I517 Cardiomegaly: Secondary | ICD-10-CM | POA: Insufficient documentation

## 2012-10-24 DIAGNOSIS — I4729 Other ventricular tachycardia: Secondary | ICD-10-CM | POA: Insufficient documentation

## 2012-10-24 DIAGNOSIS — Z01810 Encounter for preprocedural cardiovascular examination: Secondary | ICD-10-CM | POA: Insufficient documentation

## 2012-10-24 DIAGNOSIS — B958 Unspecified staphylococcus as the cause of diseases classified elsewhere: Secondary | ICD-10-CM

## 2012-10-24 DIAGNOSIS — R220 Localized swelling, mass and lump, head: Secondary | ICD-10-CM | POA: Insufficient documentation

## 2012-10-24 DIAGNOSIS — E861 Hypovolemia: Secondary | ICD-10-CM | POA: Insufficient documentation

## 2012-10-24 DIAGNOSIS — G4733 Obstructive sleep apnea (adult) (pediatric): Secondary | ICD-10-CM | POA: Insufficient documentation

## 2012-10-24 DIAGNOSIS — I5022 Chronic systolic (congestive) heart failure: Secondary | ICD-10-CM | POA: Insufficient documentation

## 2012-10-24 DIAGNOSIS — N183 Chronic kidney disease, stage 3 unspecified: Secondary | ICD-10-CM | POA: Insufficient documentation

## 2012-10-24 DIAGNOSIS — I428 Other cardiomyopathies: Secondary | ICD-10-CM | POA: Insufficient documentation

## 2012-10-24 DIAGNOSIS — Z01812 Encounter for preprocedural laboratory examination: Secondary | ICD-10-CM | POA: Insufficient documentation

## 2012-10-24 DIAGNOSIS — I509 Heart failure, unspecified: Secondary | ICD-10-CM | POA: Insufficient documentation

## 2012-10-24 LAB — PROTIME-INR
INR: 1 (ref 0.00–1.49)
Prothrombin Time: 13 seconds (ref 11.6–15.2)

## 2012-10-24 LAB — CBC
HCT: 47.2 % (ref 39.0–52.0)
Hemoglobin: 16.6 g/dL (ref 13.0–17.0)
MCH: 30.6 pg (ref 26.0–34.0)
MCHC: 35.2 g/dL (ref 30.0–36.0)
MCV: 87.1 fL (ref 78.0–100.0)
Platelets: 155 10*3/uL (ref 150–400)
RBC: 5.42 MIL/uL (ref 4.22–5.81)
RDW: 15.4 % (ref 11.5–15.5)
WBC: 9.4 10*3/uL (ref 4.0–10.5)

## 2012-10-24 LAB — COMPREHENSIVE METABOLIC PANEL
ALT: 21 U/L (ref 0–53)
AST: 17 U/L (ref 0–37)
Albumin: 3.6 g/dL (ref 3.5–5.2)
Alkaline Phosphatase: 59 U/L (ref 39–117)
BUN: 16 mg/dL (ref 6–23)
CO2: 28 mEq/L (ref 19–32)
Calcium: 9.5 mg/dL (ref 8.4–10.5)
Chloride: 104 mEq/L (ref 96–112)
Creatinine, Ser: 1.36 mg/dL — ABNORMAL HIGH (ref 0.50–1.35)
GFR calc Af Amer: 67 mL/min — ABNORMAL LOW (ref >90)
GFR calc non Af Amer: 58 mL/min — ABNORMAL LOW (ref >90)
Glucose, Bld: 113 mg/dL — ABNORMAL HIGH (ref 70–99)
Potassium: 4.3 mEq/L (ref 3.5–5.1)
Sodium: 141 mEq/L (ref 135–145)
Total Bilirubin: 0.6 mg/dL (ref 0.3–1.2)
Total Protein: 7.1 g/dL (ref 6.0–8.3)

## 2012-10-24 LAB — APTT: aPTT: 34 seconds (ref 24–37)

## 2012-10-24 LAB — SURGICAL PCR SCREEN
MRSA, PCR: NEGATIVE
Staphylococcus aureus: POSITIVE — AB

## 2012-10-24 MED ORDER — DEXTROSE 5 % IV SOLN
1.5000 g | INTRAVENOUS | Status: AC
  Administered 2012-10-25: 1.5 g via INTRAVENOUS
  Filled 2012-10-24: qty 1.5

## 2012-10-24 MED ORDER — MUPIROCIN 2 % EX OINT: TOPICAL_OINTMENT | Freq: Two times a day (BID) | CUTANEOUS | Status: DC

## 2012-10-24 NOTE — Progress Notes (Signed)
Anesthesia chart review:  Patient is a 52 year old male scheduled for video bronchoscopy/mediastinoscopy on 10/25/12 by Dr. Donata Clay.  He has a right paratracheal mass s/p non-diagnostic EBUS on 10/01/12.  Other history includes non-smoker, non-ischemic dilated CM, chronic systolic CHF, recurrent VT, s/p biventricular AICD (generator change 05/02/12), OSA with CPAP use, bladder tumor s/p TURBT on 08/13/12, HTN, CKD stage III, arthritis.  PCP is Dr. Catha Gosselin.  He is followed by cardiologist Dr. Gala Romney and Dr. Graciela Husbands.  He was seen by Mallie Darting, NP in the CHF Clinic on 10/08/12, and by notes appeared stable.  Follow-up echo is planned in two months.  Echo on 11/29/11 showed: - Left ventricle: The cavity size was severely dilated. Wall thickness was normal. The estimated ejection fraction was 15%. Diffuse hypokinesis. Features are consistent with a pseudonormal left ventricular filling pattern, with concomitant abnormal relaxation and increased filling pressure (grade 2 diastolic dysfunction). - Aortic valve: Trivial regurgitation. - Mitral valve: Mild regurgitation. - Left atrium: The atrium was moderately dilated.  Cardiopulmonary stress test on 03/01/12 showed:  Conclusion: Though limited by the submaximal exercise, this exercise test with gas exchange demonstrates a moderate functional limitation when compared to matched sedentary norms. There appears to be a mixed circulatory and ventilatory (obesity related) limitation to exercise. This data represents significant improvement from his test done in 2009.  Right heart cath on 01/27/12 showed:  RA = 1  RV = 28/2/4  PA = 32/17 (22)  PCW = 6  Fick cardiac output/index = 5.1/2.2  Thermo cardiac output/index = 5.0/2.1  PVR = 4.0 Woods  FA sat = 98%  PA sat = 64%, 68%  Assessment:  1. Hypovolemia  2. Normal cardiac ouptut   Left heart cath on 10/30/06 showed smooth and normal coronary arteries, moderate to severe LV dysfunction, EF 25%.  EKG  on 10/24/12 showed atrial sensed, v-paced rhythm.  CXR on 10/24/12 showed: 1. Known right paratracheal mass without interval change. 2. Chronic cardiomegaly and AICD. No new cardiopulmonary  abnormality.  Preoperative labs noted.  He is for a T&S on the day of surgery.  Patient has had recent cardiology follow-up and tolerated two surgical procedure under general anesthesia in the past six months, so I anticipate that he can proceed as planned if no acute changes.  He will be evaluated by his assigned anesthesiologist on the day of surgery.  Velna Ochs [facility]/Anesthesiology Phone [phone] 10/24/2012 10:29 AM

## 2012-10-24 NOTE — Progress Notes (Signed)
Patient refused to wear BB armband d/t stating work did not know about surgery. Advised patient that he would require additional blood draw and possible delay of surgery in am patient accepted risk.

## 2012-10-24 NOTE — Progress Notes (Signed)
Attempted to contact patient x 2 to get Mupirocin filled. Unable to contact patient.

## 2012-10-25 ENCOUNTER — Encounter (HOSPITAL_COMMUNITY): Payer: Self-pay | Admitting: Vascular Surgery

## 2012-10-25 ENCOUNTER — Ambulatory Visit (HOSPITAL_COMMUNITY): Payer: 59 | Admitting: Certified Registered"

## 2012-10-25 ENCOUNTER — Observation Stay (HOSPITAL_COMMUNITY)
Admission: RE | Admit: 2012-10-25 | Discharge: 2012-10-26 | Disposition: A | Discharge status: Home / Self Care | Payer: 59 | Source: Ambulatory Visit | Attending: Cardiothoracic Surgery | Admitting: Cardiothoracic Surgery

## 2012-10-25 ENCOUNTER — Observation Stay (HOSPITAL_COMMUNITY): Payer: 59

## 2012-10-25 ENCOUNTER — Encounter (HOSPITAL_COMMUNITY)
Admission: RE | Disposition: A | Discharge status: Home / Self Care | Payer: Self-pay | Source: Ambulatory Visit | Attending: Cardiothoracic Surgery

## 2012-10-25 ENCOUNTER — Encounter (HOSPITAL_COMMUNITY): Payer: Self-pay | Admitting: *Deleted

## 2012-10-25 DIAGNOSIS — R222 Localized swelling, mass and lump, trunk: Secondary | ICD-10-CM

## 2012-10-25 DIAGNOSIS — Z01818 Encounter for other preprocedural examination: Secondary | ICD-10-CM | POA: Insufficient documentation

## 2012-10-25 DIAGNOSIS — Z01812 Encounter for preprocedural laboratory examination: Secondary | ICD-10-CM | POA: Insufficient documentation

## 2012-10-25 DIAGNOSIS — J398 Other specified diseases of upper respiratory tract: Secondary | ICD-10-CM

## 2012-10-25 DIAGNOSIS — I428 Other cardiomyopathies: Secondary | ICD-10-CM | POA: Insufficient documentation

## 2012-10-25 DIAGNOSIS — R220 Localized swelling, mass and lump, head: Principal | ICD-10-CM | POA: Insufficient documentation

## 2012-10-25 HISTORY — PX: VIDEO BRONCHOSCOPY: SHX5072

## 2012-10-25 HISTORY — PX: MEDIASTINOSCOPY: SHX5086

## 2012-10-25 LAB — TYPE AND SCREEN
ABO/RH(D): A POS
Antibody Screen: NEGATIVE

## 2012-10-25 LAB — ABO/RH: ABO/RH(D): A POS

## 2012-10-25 SURGERY — BRONCHOSCOPY, VIDEO-ASSISTED
Anesthesia: General | Site: Chest | Wound class: Clean Contaminated

## 2012-10-25 MED ORDER — SODIUM CHLORIDE 0.9 % IV SOLN
10.0000 mg | INTRAVENOUS | Status: DC | PRN
  Administered 2012-10-25: 5 ug/min via INTRAVENOUS

## 2012-10-25 MED ORDER — ATORVASTATIN CALCIUM 40 MG PO TABS
40.0000 mg | ORAL_TABLET | Freq: Every day | ORAL | Status: DC
  Administered 2012-10-25: 40 mg via ORAL
  Filled 2012-10-25 (×2): qty 1

## 2012-10-25 MED ORDER — ONDANSETRON HCL 4 MG PO TABS: 4.0000 mg | ORAL_TABLET | Freq: Four times a day (QID) | ORAL | Status: DC | PRN

## 2012-10-25 MED ORDER — LACTATED RINGERS IV SOLN
INTRAVENOUS | Status: DC | PRN
  Administered 2012-10-25: 07:00:00 via INTRAVENOUS

## 2012-10-25 MED ORDER — IRBESARTAN 150 MG PO TABS
150.0000 mg | ORAL_TABLET | Freq: Every day | ORAL | Status: DC
  Administered 2012-10-25 – 2012-10-26 (×2): 150 mg via ORAL
  Filled 2012-10-25 (×2): qty 1

## 2012-10-25 MED ORDER — FEBUXOSTAT 40 MG PO TABS
40.0000 mg | ORAL_TABLET | Freq: Every day | ORAL | Status: DC
  Filled 2012-10-25 (×2): qty 1

## 2012-10-25 MED ORDER — MIDAZOLAM HCL 5 MG/5ML IJ SOLN
INTRAMUSCULAR | Status: DC | PRN
  Administered 2012-10-25 (×2): 1 mg via INTRAVENOUS

## 2012-10-25 MED ORDER — PHENOL 1.4 % MT LIQD
1.0000 | OROMUCOSAL | Status: DC | PRN
  Filled 2012-10-25: qty 177

## 2012-10-25 MED ORDER — ACETAMINOPHEN 325 MG PO TABS: 650.0000 mg | ORAL_TABLET | Freq: Four times a day (QID) | ORAL | Status: DC | PRN

## 2012-10-25 MED ORDER — PROPOFOL 10 MG/ML IV BOLUS
INTRAVENOUS | Status: DC | PRN
  Administered 2012-10-25: 100 mg via INTRAVENOUS

## 2012-10-25 MED ORDER — OXYCODONE HCL 5 MG/5ML PO SOLN: 5.0000 mg | Freq: Once | ORAL | Status: DC | PRN

## 2012-10-25 MED ORDER — SENNA 8.6 MG PO TABS
1.0000 | ORAL_TABLET | Freq: Two times a day (BID) | ORAL | Status: DC
  Administered 2012-10-25 – 2012-10-26 (×2): 8.6 mg via ORAL
  Filled 2012-10-25 (×4): qty 1

## 2012-10-25 MED ORDER — ACETAMINOPHEN 650 MG RE SUPP: 650.0000 mg | Freq: Four times a day (QID) | RECTAL | Status: DC | PRN

## 2012-10-25 MED ORDER — OXYCODONE HCL 5 MG PO TABS: 5.0000 mg | ORAL_TABLET | Freq: Once | ORAL | Status: DC | PRN

## 2012-10-25 MED ORDER — ONDANSETRON HCL 4 MG/2ML IJ SOLN: 4.0000 mg | Freq: Four times a day (QID) | INTRAMUSCULAR | Status: DC | PRN

## 2012-10-25 MED ORDER — MUPIROCIN 2 % EX OINT
TOPICAL_OINTMENT | Freq: Two times a day (BID) | CUTANEOUS | Status: DC
  Administered 2012-10-25 – 2012-10-26 (×2): via TOPICAL
  Filled 2012-10-25: qty 22

## 2012-10-25 MED ORDER — FUROSEMIDE 10 MG/ML IJ SOLN
40.0000 mg | Freq: Once | INTRAMUSCULAR | Status: AC
  Administered 2012-10-25: 40 mg via INTRAVENOUS
  Filled 2012-10-25: qty 4

## 2012-10-25 MED ORDER — HYDROMORPHONE HCL PF 1 MG/ML IJ SOLN: 0.2500 mg | INTRAMUSCULAR | Status: DC | PRN

## 2012-10-25 MED ORDER — HEMOSTATIC AGENTS (NO CHARGE) OPTIME
TOPICAL | Status: DC | PRN
  Administered 2012-10-25: 1 via TOPICAL

## 2012-10-25 MED ORDER — 0.9 % SODIUM CHLORIDE (POUR BTL) OPTIME
TOPICAL | Status: DC | PRN
  Administered 2012-10-25: 2000 mL

## 2012-10-25 MED ORDER — CARVEDILOL 6.25 MG PO TABS
18.7500 mg | ORAL_TABLET | Freq: Two times a day (BID) | ORAL | Status: DC
  Administered 2012-10-25 – 2012-10-26 (×2): 18.75 mg via ORAL
  Filled 2012-10-25 (×4): qty 1

## 2012-10-25 MED ORDER — POLYETHYLENE GLYCOL 3350 17 G PO PACK
17.0000 g | PACK | Freq: Every day | ORAL | Status: DC | PRN
  Filled 2012-10-25: qty 1

## 2012-10-25 MED ORDER — HYDROCODONE-ACETAMINOPHEN 5-325 MG PO TABS
1.0000 | ORAL_TABLET | ORAL | Status: DC | PRN
  Filled 2012-10-25: qty 1

## 2012-10-25 MED ORDER — DIGOXIN 125 MCG PO TABS
0.1250 mg | ORAL_TABLET | Freq: Every day | ORAL | Status: DC
  Administered 2012-10-26: 0.125 mg via ORAL
  Filled 2012-10-25: qty 1

## 2012-10-25 MED ORDER — GUAIFENESIN-DM 100-10 MG/5ML PO SYRP
5.0000 mL | ORAL_SOLUTION | ORAL | Status: DC | PRN
  Administered 2012-10-26: 5 mL via ORAL
  Filled 2012-10-25: qty 5

## 2012-10-25 MED ORDER — NEOSTIGMINE METHYLSULFATE 1 MG/ML IJ SOLN
INTRAMUSCULAR | Status: DC | PRN
  Administered 2012-10-25: 4 mg via INTRAVENOUS

## 2012-10-25 MED ORDER — FUROSEMIDE 40 MG PO TABS
40.0000 mg | ORAL_TABLET | ORAL | Status: DC
  Administered 2012-10-26: 40 mg via ORAL
  Filled 2012-10-25: qty 1

## 2012-10-25 MED ORDER — PROMETHAZINE HCL 25 MG/ML IJ SOLN: 6.2500 mg | INTRAMUSCULAR | Status: DC | PRN

## 2012-10-25 MED ORDER — MORPHINE SULFATE 2 MG/ML IJ SOLN: 2.0000 mg | INTRAMUSCULAR | Status: DC | PRN

## 2012-10-25 MED ORDER — BUDESONIDE 0.5 MG/2ML IN SUSP
0.5000 mg | Freq: Two times a day (BID) | RESPIRATORY_TRACT | Status: DC
  Administered 2012-10-26: 0.5 mg via RESPIRATORY_TRACT
  Filled 2012-10-25 (×6): qty 2

## 2012-10-25 MED ORDER — FENTANYL CITRATE 0.05 MG/ML IJ SOLN
INTRAMUSCULAR | Status: DC | PRN
  Administered 2012-10-25: 50 ug via INTRAVENOUS
  Administered 2012-10-25: 25 ug via INTRAVENOUS
  Administered 2012-10-25: 50 ug via INTRAVENOUS
  Administered 2012-10-25: 25 ug via INTRAVENOUS

## 2012-10-25 MED ORDER — MUPIROCIN 2 % EX OINT
TOPICAL_OINTMENT | Freq: Two times a day (BID) | CUTANEOUS | Status: DC
  Administered 2012-10-25: 17:00:00 via NASAL

## 2012-10-25 MED ORDER — IOHEXOL 300 MG/ML  SOLN
70.0000 mL | Freq: Once | INTRAMUSCULAR | Status: AC | PRN
  Administered 2012-10-25: 70 mL via INTRAVENOUS

## 2012-10-25 MED ORDER — MUPIROCIN 2 % EX OINT
TOPICAL_OINTMENT | CUTANEOUS | Status: AC
  Administered 2012-10-25: 1 via NASAL
  Filled 2012-10-25: qty 22

## 2012-10-25 MED ORDER — GLYCOPYRROLATE 0.2 MG/ML IJ SOLN
INTRAMUSCULAR | Status: DC | PRN
  Administered 2012-10-25: .6 mg via INTRAVENOUS

## 2012-10-25 MED ORDER — ALUM & MAG HYDROXIDE-SIMETH 200-200-20 MG/5ML PO SUSP: 30.0000 mL | Freq: Four times a day (QID) | ORAL | Status: DC | PRN

## 2012-10-25 MED ORDER — DOCUSATE SODIUM 100 MG PO CAPS
100.0000 mg | ORAL_CAPSULE | Freq: Two times a day (BID) | ORAL | Status: DC
  Administered 2012-10-25 – 2012-10-26 (×2): 100 mg via ORAL
  Filled 2012-10-25 (×2): qty 1

## 2012-10-25 MED ORDER — SUCCINYLCHOLINE CHLORIDE 20 MG/ML IJ SOLN
INTRAMUSCULAR | Status: DC | PRN
  Administered 2012-10-25: 100 mg via INTRAVENOUS

## 2012-10-25 MED ORDER — ROCURONIUM BROMIDE 100 MG/10ML IV SOLN
INTRAVENOUS | Status: DC | PRN
  Administered 2012-10-25: 30 mg via INTRAVENOUS
  Administered 2012-10-25: 5 mg via INTRAVENOUS

## 2012-10-25 MED ORDER — COLCHICINE 0.6 MG PO TABS
0.6000 mg | ORAL_TABLET | Freq: Every day | ORAL | Status: DC | PRN
  Filled 2012-10-25: qty 1

## 2012-10-25 MED ORDER — POTASSIUM CHLORIDE IN NACL 20-0.9 MEQ/L-% IV SOLN
INTRAVENOUS | Status: DC
  Administered 2012-10-25: 50 mL/h via INTRAVENOUS
  Filled 2012-10-25 (×2): qty 1000

## 2012-10-25 MED ORDER — DEXTROSE 5 % IV SOLN: 1.5000 g | INTRAVENOUS | Status: DC

## 2012-10-25 MED ORDER — SODIUM CHLORIDE 0.9 % IJ SOLN: 3.0000 mL | Freq: Two times a day (BID) | INTRAMUSCULAR | Status: DC

## 2012-10-25 MED ORDER — ONDANSETRON HCL 4 MG/2ML IJ SOLN
INTRAMUSCULAR | Status: DC | PRN
  Administered 2012-10-25: 4 mg via INTRAVENOUS

## 2012-10-25 SURGICAL SUPPLY — 52 items
ADH SKN CLS APL DERMABOND .7 (GAUZE/BANDAGES/DRESSINGS) ×1
BLADE SURG 10 STRL SS (BLADE) ×3 IMPLANT
BLADE SURG 15 STRL LF DISP TIS (BLADE) ×1 IMPLANT
BLADE SURG 15 STRL SS (BLADE) ×3
CANISTER SUCTION 2500CC (MISCELLANEOUS) ×3 IMPLANT
CLIP TI MEDIUM 6 (CLIP) ×3 IMPLANT
CLIP TI WIDE RED SMALL 6 (CLIP) IMPLANT
CLOTH BEACON ORANGE TIMEOUT ST (SAFETY) ×3 IMPLANT
CONT SPEC 4OZ CLIKSEAL STRL BL (MISCELLANEOUS) ×6 IMPLANT
COVER SURGICAL LIGHT HANDLE (MISCELLANEOUS) ×6 IMPLANT
DERMABOND ADVANCED (GAUZE/BANDAGES/DRESSINGS) ×2
DERMABOND ADVANCED .7 DNX12 (GAUZE/BANDAGES/DRESSINGS) ×1 IMPLANT
DRAPE LAPAROTOMY T 102X78X121 (DRAPES) ×3 IMPLANT
ELECT BLADE 6.5 EXT (BLADE) ×3 IMPLANT
ELECT CAUTERY BLADE 6.4 (BLADE) ×3 IMPLANT
ELECT REM PT RETURN 9FT ADLT (ELECTROSURGICAL) ×3
ELECTRODE REM PT RTRN 9FT ADLT (ELECTROSURGICAL) ×1 IMPLANT
GAUZE SPONGE 4X4 16PLY XRAY LF (GAUZE/BANDAGES/DRESSINGS) ×3 IMPLANT
GLOVE BIO SURGEON STRL SZ7.5 (GLOVE) ×6 IMPLANT
GLOVE BIOGEL PI IND STRL 6.5 (GLOVE) ×3 IMPLANT
GLOVE BIOGEL PI INDICATOR 6.5 (GLOVE) ×6
GOWN PREVENTION PLUS XLARGE (GOWN DISPOSABLE) ×6 IMPLANT
GOWN STRL NON-REIN LRG LVL3 (GOWN DISPOSABLE) ×6 IMPLANT
HEMOSTAT SURGICEL 2X14 (HEMOSTASIS) ×3 IMPLANT
KIT BASIN OR (CUSTOM PROCEDURE TRAY) ×3 IMPLANT
KIT ROOM TURNOVER OR (KITS) ×3 IMPLANT
NS IRRIG 1000ML POUR BTL (IV SOLUTION) ×6 IMPLANT
PACK SURGICAL SETUP 50X90 (CUSTOM PROCEDURE TRAY) ×3 IMPLANT
PAD ARMBOARD 7.5X6 YLW CONV (MISCELLANEOUS) ×6 IMPLANT
PAD DEFIB STAT PADZ MULTI (MISCELLANEOUS) ×3 IMPLANT
PENCIL BUTTON HOLSTER BLD 10FT (ELECTRODE) ×6 IMPLANT
SPONGE GAUZE 4X4 12PLY (GAUZE/BANDAGES/DRESSINGS) ×3 IMPLANT
SPONGE INTESTINAL PEANUT (DISPOSABLE) ×3 IMPLANT
STAPLER VISISTAT 35W (STAPLE) IMPLANT
SUT SILK 2 0 (SUTURE) ×3
SUT SILK 2 0 TIES 10X30 (SUTURE) ×3 IMPLANT
SUT SILK 2 0SH CR/8 30 (SUTURE) ×3 IMPLANT
SUT SILK 2-0 18XBRD TIE 12 (SUTURE) ×1 IMPLANT
SUT VIC AB 2-0 CT1 27 (SUTURE) ×3
SUT VIC AB 2-0 CT1 TAPERPNT 27 (SUTURE) ×1 IMPLANT
SUT VIC AB 3-0 SH 27 (SUTURE) ×3
SUT VIC AB 3-0 SH 27X BRD (SUTURE) ×1 IMPLANT
SUT VIC AB 3-0 SH 8-18 (SUTURE) ×3 IMPLANT
SUT VIC AB 3-0 X1 27 (SUTURE) ×3 IMPLANT
SWAB COLLECTION DEVICE MRSA (MISCELLANEOUS) IMPLANT
SYRINGE 10CC LL (SYRINGE) ×3 IMPLANT
TOWEL OR 17X24 6PK STRL BLUE (TOWEL DISPOSABLE) ×3 IMPLANT
TOWEL OR 17X26 10 PK STRL BLUE (TOWEL DISPOSABLE) ×3 IMPLANT
TUBE ANAEROBIC SPECIMEN COL (MISCELLANEOUS) IMPLANT
TUBE CONNECTING 12'X1/4 (SUCTIONS) ×1
TUBE CONNECTING 12X1/4 (SUCTIONS) ×2 IMPLANT
WATER STERILE IRR 1000ML POUR (IV SOLUTION) ×3 IMPLANT

## 2012-10-25 NOTE — Anesthesia Procedure Notes (Signed)
Procedure Name: Intubation Date/Time: 10/25/2012 8:10 AM Performed by: Armandina Gemma Pre-anesthesia Checklist: Patient identified, Timeout performed, Emergency Drugs available, Suction available and Patient being monitored Patient Re-evaluated:Patient Re-evaluated prior to inductionOxygen Delivery Method: Circle system utilized Preoxygenation: Pre-oxygenation with 100% oxygen Intubation Type: IV induction and Rapid sequence Ventilation: Mask ventilation without difficulty Grade View: Grade I Tube type: Oral Tube size: 8.5 mm Number of attempts: 1 Airway Equipment and Method: Stylet,  LTA kit utilized and Video-laryngoscopy Placement Confirmation: ETT inserted through vocal cords under direct vision,  breath sounds checked- equal and bilateral and positive ETCO2 Dental Injury: Teeth and Oropharynx as per pre-operative assessment  Difficulty Due To: Difficult Airway- due to limited oral opening and Difficult Airway- due to reduced neck mobility Comments: Iv induction Singer- intubation AMM CRNA with Glidescope- very small mouth opening and limited jaw and neck extension- first attempt with Glidescope

## 2012-10-25 NOTE — Anesthesia Postprocedure Evaluation (Signed)
Anesthesia Post Note  Patient: [name]  Procedure(s) Performed: Procedure(s) (LRB): VIDEO BRONCHOSCOPY (N/A) MEDIASTINOSCOPY (N/A)  Anesthesia type: general  Patient location: PACU  Post pain: Pain level controlled  Post assessment: Patient's Cardiovascular Status Stable  Last Vitals:  Filed Vitals:   10/25/12 1115  BP: 132/77  Pulse: 91  Temp:   Resp: 26    Post vital signs: Reviewed and stable  Level of consciousness: sedated  Complications: No apparent anesthesia complications

## 2012-10-25 NOTE — Preoperative (Signed)
Beta Blockers   Reason not to administer Beta Blockers:Not Applicable 

## 2012-10-25 NOTE — Progress Notes (Signed)
Pt. Without any symptoms however ST elevation noticed after turning AICD back on.  Dr. Donata Clay at bedside and stat 12 lead EKG ordered.  Paced rhythm vs. ST elevation.

## 2012-10-25 NOTE — Transfer of Care (Signed)
Immediate Anesthesia Transfer of Care Note  Patient: [name]  Procedure(s) Performed: Procedure(s): VIDEO BRONCHOSCOPY (N/A) MEDIASTINOSCOPY (N/A)  Patient Location: PACU  Anesthesia Type:General  Level of Consciousness: sedated  Airway & Oxygen Therapy: Patient Spontanous Breathing and Patient connected to face mask oxygen  Post-op Assessment: Report given to PACU RN and Post -op Vital signs reviewed and stable  Post vital signs: Reviewed and stable  Complications: No apparent anesthesia complications

## 2012-10-25 NOTE — Progress Notes (Signed)
medtronic rep. Contacted that pt. Is for surgery @0730 .

## 2012-10-25 NOTE — Anesthesia Preprocedure Evaluation (Addendum)
Anesthesia Evaluation  Patient identified by MRN, date of birth, ID band Patient awake    Reviewed: Allergy & Precautions, H&P , NPO status , Patient's Chart, lab work & pertinent test results, reviewed documented beta blocker date and time   History of Anesthesia Complications Negative for: history of anesthetic complications  Airway Mallampati: III TM Distance: >3 FB Neck ROM: Full  Mouth opening: Limited Mouth Opening  Dental  (+) Dental Advisory Given, Implants and Caps   Pulmonary shortness of breath and with exertion, sleep apnea and Continuous Positive Airway Pressure Ventilation ,    Pulmonary exam normal       Cardiovascular hypertension, Pt. on home beta blockers +CHF + dysrhythmias + Cardiac Defibrillator  Idiopathic dilated cardiopathy EF=15%   Neuro/Psych negative neurological ROS  negative psych ROS   GI/Hepatic negative GI ROS, Neg liver ROS,   Endo/Other  Hyperthyroidism Pt denies any thyroid prob  Renal/GU Renal disease  negative genitourinary   Musculoskeletal   Abdominal   Peds  Hematology   Anesthesia Other Findings   Reproductive/Obstetrics                         Anesthesia Physical Anesthesia Plan  ASA: III  Anesthesia Plan: General   Post-op Pain Management:    Induction: Intravenous  Airway Management Planned: Oral ETT and Video Laryngoscope Planned  Additional Equipment:   Intra-op Plan:   Post-operative Plan: Extubation in OR  Informed Consent:   Dental advisory given  Plan Discussed with: CRNA, Anesthesiologist and Surgeon  Anesthesia Plan Comments:        Anesthesia Quick Evaluation

## 2012-10-25 NOTE — Progress Notes (Signed)
The patient was examined and preop studies reviewed. There has been no change from the prior exam and the patient is ready for surgery.  Plan bronch- mediastinoscopy on [address] for R paratracheal mass

## 2012-10-25 NOTE — Brief Op Note (Signed)
10/25/2012  10:04 AM  PATIENT:  [name]  52 y.o. male  PRE-OPERATIVE DIAGNOSIS:  RIGHT PARATRACHEAL MASS  POST-OPERATIVE DIAGNOSIS:  RIGHT PARATRACHEAL MASS  PROCEDURE:  Procedure(s): VIDEO BRONCHOSCOPY (N/A) MEDIASTINOSCOPY (N/A)  SURGEON:  Surgeon(s) and Role:    * Kerin Perna, MD - Primary  PHYSICIAN ASSISTANT: 0  ASSISTANTS:  [id number]  ANESTHESIA:   general  EBL:   20 cc  BLOOD ADMINISTERED:none  DRAINS: none   LOCAL MEDICATIONS USED:  NONE  SPECIMEN:  Biopsy / Limited Resection  DISPOSITION OF SPECIMEN:  PATHOLOGY  COUNTS:  YES  TOURNIQUET:  * No tourniquets in log *  DICTATION: .Dragon Dictation  PLAN OF CARE: Admit for overnight observation  PATIENT DISPOSITION:  PACU - hemodynamically stable.   Delay start of Pharmacological VTE agent (>24hrs) due to surgical blood loss or risk of bleeding: yes

## 2012-10-25 NOTE — Op Note (Signed)
NAME:  [name] NO.:  [id number]  MEDICAL RECORD NO.:  [id number]  LOCATION:  [facility]                         FACILITY:  [facility]  PHYSICIAN:  Kerin Perna, M.D.  DATE OF BIRTH:  Apr 03, 1960  DATE OF PROCEDURE:  10/25/2012 DATE OF DISCHARGE:                              OPERATIVE REPORT   OPERATION: 1. Video bronchoscopy. 2. Mediastinoscopy and biopsy of right paratracheal mass.  ANESTHESIA:  General.  SURGEON:  Kerin Perna, MD  INDICATIONS:  The patient is a 52 year old African American gentleman with idiopathic cardiomyopathy, and ejection fraction of 15% managed in the Heart failure Clinic.  He has documented enlargement of a right paratracheal mass which was first noted approximately 5 years ago.  A transbronchial biopsy was not definitive and mediastinoscopy was recommended.  I saw the patient in the office and reviewed the results of his CT scans and discussed the procedure of mediastinoscopy for obtaining tissue for biopsy.  The patient understood that we were not planning on resecting the mass.  I discussed the location of the surgical incision, the expected postop recovery, and the potential risks of bleeding, pneumothorax, and infection.  He understood and agreed to proceed with surgery.  OPERATIVE PROCEDURE:  The patient was brought to the operating room and placed supine on the operating table where general anesthesia was induced.  Prior to surgery, his AICD was disarmed.  He was prepared for video bronchoscopy through the ET tube.  A proper time-out was performed.  The video bronchoscope was passed down the ET tube.  There was no notable compression of the trachea.  The carina and proximal left mainstem bronchus were normal.  The segments of the left upper lobe and left lower lobe were each individually visualized, and there were no endobronchial lesions noted.  The bronchoscope was then passed to the right side.  The right  mainstem bronchus was normal.  The endobronchial segments of the right upper lobe, right middle lobe, right lower lobes were all individually examined, and there were no endobronchial lesions.  Washings were taken from the right lung and submitted for cytology.  The bronchoscope was then removed.  The patient was then repositioned on the OR table and the neck and upper chest was prepped and draped as a sterile field.  A second proper time- out was performed.  A small incision was made above the suprasternal notch.  Dissection was taken down to the pretracheal plane.  The patient had very fibrotic connective tissue and this does extend into the mediastinum and surrounding the trachea.  eventually, the plane of dissection was developed anterior and to the right side of the trachea and carried down into the mediastinum.  There was some soft tissue fullness which was biopsied at the 2R level lymph nodes.  Access to the superior mediastinum was restricted by his anatomy with a very short thick neck.  There was also some venous bleeding encountered and this was treated with cautery and Surgicel packing.  After applying topical pressure, there was hemostasis.  The incision was then closed in layers using interrupted 3-0 Vicryl for the subcutaneous-platysmal layer.  The skin was closed in running Vicryl.  Dermabond  was applied.  The patient was extubated and returned to recovery room in stable condition.     Kerin Perna, M.D.     PV/MEDQ  D:  10/25/2012  T:  10/25/2012  Job:  [id number]

## 2012-10-26 ENCOUNTER — Observation Stay (HOSPITAL_COMMUNITY): Payer: 59

## 2012-10-26 ENCOUNTER — Encounter (HOSPITAL_COMMUNITY): Payer: Self-pay | Admitting: Cardiothoracic Surgery

## 2012-10-26 MED ORDER — GUAIFENESIN-DM 100-10 MG/5ML PO SYRP: 5.0000 mL | ORAL_SOLUTION | ORAL | Status: DC | PRN

## 2012-10-26 MED ORDER — HYDROCODONE-ACETAMINOPHEN 5-325 MG PO TABS: 1.0000 | ORAL_TABLET | ORAL | Status: DC | PRN

## 2012-10-26 NOTE — Progress Notes (Signed)
Utilization review completed.  

## 2012-10-26 NOTE — Consult Note (Signed)
Advanced Heart Failure Team Consult Note  Referring Physician: Dr Maren Beach PCP: Dr Clarene Duke  Nephrologist: Dr [name]  Cardiologist: Dr Graciela Husbands    Reason for Consultation: Heart Failure  HPI:   The heart failure team was asked to provide consult for ongoing heart failure management. 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 He also has prostatitis, and OSA (CPAP). LHC 2008: Normal cors.   He is intoelrant to numerous HF meds including spiro (severe hyperkalemia - requiring HD), hydralazine (fuzzy-headed) and imdur (severe HAs).   Admitted to [facility] for mediastinoscopy and biopsy of R paratracheal mass.   Denies SOB/PND/Orthopnea  Review of Systems: [y] = yes, [ ]  = no .   General: Weight gain [ ] ; Weight loss [ ] ; Anorexia [ ] ; Fatigue [Y ]; Fever [ ] ; Chills [ ] ; Weakness [ ]   Cardiac: Chest pain/pressure [ ] ; Resting SOB [ ] ; Exertional SOB [ ] ; Orthopnea [ ] ; Pedal Edema [ ] ; Palpitations [ ] ; Syncope [ ] ; Presyncope [ ] ; Paroxysmal nocturnal dyspnea[ ]   Pulmonary: Cough [ ] ; Wheezing[ ] ; Hemoptysis[ ] ; Sputum [ ] ; Snoring [ ]   GI: Vomiting[ ] ; Dysphagia[ ] ; Melena[ ] ; Hematochezia [ ] ; Heartburn[ ] ; Abdominal pain [ ] ; Constipation [ ] ; Diarrhea [ ] ; BRBPR [ ]   GU: Hematuria[ ] ; Dysuria [ ] ; Nocturia[ ]   Vascular: Pain in legs with walking [ ] ; Pain in feet with lying flat [ ] ; Non-healing sores [ ] ; Stroke [ ] ; TIA [ ] ; Slurred speech [ ] ;  Neuro: Headaches[Y ]; Vertigo[ ] ; Seizures[ ] ; Paresthesias[ ] ;Blurred vision [ ] ; Diplopia [ ] ; Vision changes [ ]   Ortho/Skin: Arthritis [ ] ; Joint pain [ Y]; Muscle pain [ ] ; Joint swelling [ ] ; Back Pain [ ] ; Rash [ ]   Psych: Depression[ ] ; Anxiety[ ]   Heme: Bleeding problems [ ] ; Clotting disorders [ ] ; Anemia [ ]   Endocrine:  Diabetes [ ] ; Thyroid dysfunction[ ]   Home Medications Prior to Admission medications   Medication Sig Start Date End Date Taking? Authorizing Provider  atorvastatin (LIPITOR) 40 MG tablet Take 40 mg by mouth at bedtime.  04/03/12  Yes Vesta Mixer, MD  carvedilol (COREG) 12.5 MG tablet Take 1.5 tablets (18.75 mg total) by mouth 2 (two) times daily with a meal. 10/08/12  Yes Aundria Rud, NP  colchicine 0.6 MG tablet Take 0.6 mg by mouth daily as needed (gout).   Yes Historical Provider, MD  digoxin (LANOXIN) 0.25 MG tablet Take 0.125 mg by mouth daily.  04/11/11  Yes Duke Salvia, MD  febuxostat (ULORIC) 40 MG tablet Take 40 mg by mouth daily.    Yes Historical Provider, MD  furosemide (LASIX) 40 MG tablet Take 40 mg by mouth 3 (three) times a week. Monday, Wednesday and Friday   Yes Historical Provider, MD  mupirocin ointment (BACTROBAN) 2 % Apply topically 2 (two) times daily. INSERT INTO EACH NOSTRIL TWICE DAILY AND MASSAGE 10/24/12  Yes Kerin Perna, MD  olmesartan (BENICAR) 20 MG tablet Take 20 mg by mouth daily.    Yes Historical Provider, MD  OVER THE COUNTER MEDICATION Place 1 drop into both eyes daily as needed (red eyes). Over the counter eye drops for redness   Yes Historical Provider, MD    Past Medical  History: Past Medical History  Diagnosis Date  . Gout 08/03/12    PT C/O OF RIGHT KNEE PAIN AND SWELLING -STATES GOUT FLARE UP - ONGOING SINCE APRIL - BUT SWELLING W/IN LAST WEEK  . Cardiomyopathy     Idiopathic dilated;   Marland Kitchen Ventricular tachycardia     s/p ICD  . Sleep apnea     CPAP  . Systolic CHF     EF 15%  . CKD (chronic kidney disease)     stage III baseline Crt 1.8-2.0  . Biventricular ICD (implantable cardiac defibrillator) [employer]onic ]     DOI 2008/ upgrade 2010/ Gen Change 2014  . Automatic implantable cardioverter-defibrillator in situ   . CHF (congestive heart failure)   . Bladder tumor     PT HOSP AT [facility] 4/24 TO 4/26 2014 WITH UTI--AND FOUND TO HAVE  BLADDER TUMOR-  . Arthritis     GOUT  . Hilar mass     Noted CT 2010  . HTN (hypertension)     Dr. Graciela Husbands cardiologist    Past Surgical History: Past Surgical History  Procedure Laterality Date  . Cholecystectomy    . Cardiac catheterization      he was found to have normal coronary arteries but with a globally dilated and hypocontractile heart  . US echocardiography  03-17-2008, 07-03-2006    EF 15-20%, EF 15-20%  . Transurethral resection of bladder tumor N/A 08/13/2012    Procedure: TRANSURETHRAL RESECTION OF BLADDER TUMOR (TURBT);  Surgeon: Garnett Farm, MD;  Location: [facility]S;  Service: Urology;  Laterality: N/A;  MITOMYCIN C  . Icd insertion  04/2012-new ICD  . Endobronchial ultrasound Bilateral 10/01/2012    Procedure: ENDOBRONCHIAL ULTRASOUND;  Surgeon: Leslye Peer, MD;  Location: [facility] ENDOSCOPY;  Service: Cardiopulmonary;  Laterality: Bilateral;  . Cornea replacement      Family History: History reviewed. No pertinent family history.  Social History: History   Social History  . Marital Status: Married    Spouse Name: N/A    Number of Children: 2  . Years of Education: N/A   Occupational History  . [employer]   Social History Main Topics  . Smoking status: Never Smoker   . Smokeless tobacco: Never Used  . Alcohol Use: No  . Drug Use: No  . Sexual Activity: No   Other Topics Concern  . None   Social History Narrative  . None    Allergies:  No Known Allergies  Objective:    Vital Signs:   Temp:  [96.8 F (36 C)-100.1 F (37.8 C)] 99.1 F (37.3 C) (08/22 0732) Pulse Rate:  [81-103] 88 (08/22 0411) Resp:  [14-33] 24 (08/22 0411) BP: (109-148)/(56-97) 109/56 mmHg (08/22 0411) SpO2:  [89 %-98 %] 93 % (08/22 0411) Last BM Date: 10/25/12  Weight change: There were no vitals filed for this visit.  Intake/Output:   Intake/Output Summary (Last 24 hours) at 10/26/12 0750 Last data filed at 10/26/12 0200  Gross per 24 hour  Intake    1120 ml  Output   1000 ml  Net    120 ml     Physical Exam: General:  Well appearing. No resp difficulty HEENT: normal Neck: supple. JVP hard to tell due thick neck . Carotids 2+ bilat; no bruits. No lymphadenopathy or thryomegaly appreciated. Incision approximated post medicstinospcopy Cor: PMI nondisplaced. Regular rate & rhythm. No rubs, gallops or murmurs. L chest scar -ICD Lungs: clear Abdomen: obese  soft, nontender, nondistended. No  hepatosplenomegaly. No bruits or masses. Good bowel sounds. Extremities: no cyanosis, clubbing, rash, edema Neuro: alert & orientedx3, cranial nerves grossly intact. moves all 4 extremities w/o difficulty. Affect pleasant  Telemetry:   Labs: Basic Metabolic Panel:  Recent Labs Lab 10/24/12 0840  NA 141  K 4.3  CL 104  CO2 28  GLUCOSE 113*  BUN 16  CREATININE 1.36*  CALCIUM 9.5    Liver Function Tests:  Recent Labs Lab 10/24/12 0840  AST 17  ALT 21  ALKPHOS 59  BILITOT 0.6  PROT 7.1  ALBUMIN 3.6   No results found for this basename: LIPASE, AMYLASE,  in the last 168 hours No results found for this basename: AMMONIA,  in the last 168 hours  CBC:  Recent Labs Lab 10/24/12 0840  WBC 9.4  HGB 16.6  HCT 47.2  MCV 87.1  PLT 155    Cardiac Enzymes: No results found for this basename: CKTOTAL, CKMB, CKMBINDEX, TROPONINI,  in the last 168 hours  BNP: BNP (last 3 results)  Recent Labs  01/25/12 1817  PROBNP 332.9*    CBG: No results found for this basename: GLUCAP,  in the last 168 hours  Coagulation Studies:  Recent Labs  10/24/12 0840  LABPROT 13.0  INR 1.00    Other results: EKG: {Dg Chest 2 View  10/26/2012   *RADIOLOGY REPORT*  Clinical Data: Mediastinoscopy  CHEST - 2 VIEW  Comparison: 10/25/2012  Findings: A defibrillator is again noted.  Cardiac shadow is enlarged.  A large right paratracheal mediastinal mass is again seen with compression upon the trachea.  The lungs are well-aerated bilaterally.   The changes seen previously have resolved in the interval.  IMPRESSION: Improved aeration bilaterally.  Persistent mediastinal mass with tracheal compression.   Original Report Authenticated By: Alcide Clever, M.D.   Dg Chest 2 View Within Previous 72 Hours.  Films Obtained On Friday Are Acceptable For Monday And Tuesday Cases  10/24/2012   CLINICAL DATA:  52 year old male preoperative study. Paratracheal mass.  EXAM: CHEST  2 VIEW  COMPARISON:  Chest CT 09/11/2012 and earlier. Chest radiographs dating back to 2008.  FINDINGS: Chronic right paratracheal mass, well-delineated margins, size has significantly progressed since 2008 as described on the recent CT, but is stable in the interim. Mass effect now on the right trachea. Other mediastinal contours are within normal limits. Stable lung volumes. Left chest cardiac AICD. No pneumothorax, pulmonary edema or pleural effusion. No acute osseous abnormality identified.  IMPRESSION: 1. Known right paratracheal mass without interval change. 2. Chronic cardiomegaly and AICD. No new cardiopulmonary abnormality.   Electronically Signed   By: Augusto Gamble   On: 10/24/2012 08:56   Ct Head W Wo Contrast  10/25/2012   CLINICAL DATA:  Headache.  Mediastinal mass.  EXAM: CT HEAD WITHOUT AND WITH CONTRAST  TECHNIQUE: Contiguous axial images were obtained from the base of the skull through the vertex without and with intravenous contrast  CONTRAST:  70mL OMNIPAQUE IOHEXOL 300 MG/ML  SOLN  COMPARISON:  Non  FINDINGS: An abnormal 2.0 x 1.9 cm region of hypodensity in the right frontal lobe on image 15 of series 2 has a very similar appearance without obvious enhancement on post-contrast images The brainstem, cerebellum, cerebral peduncles, thalamus, basal ganglia, basilar cisterns, and ventricular system appear within normal limits. No intracranial hemorrhage is observed.  IMPRESSION: 1. Abnormal 2 cm right frontal lobe hypodensity without obvious enhancement. Differential diagnostic  considerations include prior (subacute or chronic) infarct or low  grade neoplasm. The lack of enhancement argues against active infection. I note the presence of the AICD which probably precludes MRI imaging. Correlate with any history of stroke or brain injury ; careful imaging followup is suggested.   Electronically Signed   By: Herbie Baltimore   On: 10/25/2012 17:20   Portable Chest 1 View  10/25/2012   *RADIOLOGY REPORT*  Clinical Data: Postop mediastinoscopy.  PORTABLE CHEST - 1 VIEW  Comparison: 10/24/2012.  Findings: Sequential defibrillator in place with leads grossly similar position.  Large right paratracheal mass with compression of the trachea. Narrowing of the trachea more prominent than on the prior exam.  Cardiomegaly.  No gross pneumothorax.  Pulmonary edema.  Confluent opacification lower lung zones may be related to pulmonary edema.  Infection or aspiration not excluded proper clinical setting.  IMPRESSION: Large right paratracheal mass with compression of the trachea. Narrowing of the trachea more prominent than on the prior exam.  Cardiomegaly.  No gross pneumothorax.  Pulmonary edema.  Confluent opacification lower lung zones may be related to pulmonary edema.  Infection or aspiration not excluded proper clinical setting.  This has been made a PRA call report utilizing dashboard call feature.   Original Report Authenticated By: Lacy Duverney, M.D.      Medications:     Current Medications: . atorvastatin  40 mg Oral QHS  . budesonide (PULMICORT) nebulizer solution  0.5 mg Nebulization BID  . carvedilol  18.75 mg Oral BID WC  . digoxin  0.125 mg Oral Daily  . docusate sodium  100 mg Oral BID  . febuxostat  40 mg Oral Daily  . furosemide  40 mg Oral 3 times weekly  . irbesartan  150 mg Oral Daily  . mupirocin ointment   Topical BID  . senna  1 tablet Oral BID  . sodium chloride  3 mL Intravenous Q12H     Infusions: . 0.9 % NaCl with KCl 20 mEq / L 50 mL/hr at 10/26/12  0600      Assessment:  1. Paratracheal Mass. 10/25/12 S/P mediastinoscopy and biospy paratracheal mass  2. Chronic systolic heart failure NICM  EF 15%  3. S/P CRT-D 2010 4.Chronic renal failure stage 3 - baseline 1.8-2.0  5. OSA - CPAP nightly  6. Gout    Plan/Discussion:    Jamie Paar is well known HF team and followed closely in HF clinic.   Volume status stable. Continue lasix 40 mg daily. Renal function stable. Continue irbesartan 150 mg daily. Will need to watch potassium closely as he has been hyperkalemic and had HD in the past. No spironolatone due hsotory of hyperkalemia. Continue Carvedilol 18.75 mg twice a day. Continue digoxin 0.125 mg daily.    He will need follow up within 7 days of discharge. Follow up set 11/09/12 at 9:00 am.   Length of Stay: 1  CLEGG,AMY 10/26/2012, 7:50 AM  Advanced Heart Failure Team Pager [phone] (M-F; 7a - 4p)  Please contact [facility] for night-coverage after hours (4p -7a ) and weekends on [email or web address]  Patient discharged this am prior to me seeing him. Note above reviewed. We will continue to follow in HF Clinic. Await path results from biopsy.   Muneer Leider,MD 12:26 PM

## 2012-10-26 NOTE — Progress Notes (Signed)
Discharge instructions given to patient and wife, one vicodin given for head pain scale of 5/10. EKG intervals- PR-0.19, QRS-0.14, QT-0.35, QTc-0.43.

## 2012-10-26 NOTE — Progress Notes (Addendum)
      [address]       [place] [address]             [phone]       1 Day Post-Op Procedure(s) (LRB): VIDEO BRONCHOSCOPY (N/A) MEDIASTINOSCOPY (N/A)  Subjective: He has complaints of productive cough  Objective: Vital signs in last 24 hours: Temp:  [96.8 F (36 C)-100.1 F (37.8 C)] 99.1 F (37.3 C) (08/22 0732) Pulse Rate:  [81-103] 88 (08/22 0411) Cardiac Rhythm:  [-] Normal sinus rhythm;Bundle branch block (08/21 2000) Resp:  [14-33] 24 (08/22 0411) BP: (109-148)/(56-97) 109/56 mmHg (08/22 0411) SpO2:  [89 %-98 %] 93 % (08/22 0411)     Intake/Output from previous day: 08/21 0701 - 08/22 0700 In: 1120 [P.O.:320; I.V.:800] Out: 1000 [Urine:975; Blood:25]   Physical Exam:  Cardiovascular: RRR Pulmonary: Clear to auscultation bilaterally; no rales, wheezes, or rhonchi. Abdomen: Soft, non tender, bowel sounds present. Wounds: Clean and dry.  No erythema or signs of infection.   Lab Results: CBC: Recent Labs  10/24/12 0840  WBC 9.4  HGB 16.6  HCT 47.2  PLT 155   BMET:  Recent Labs  10/24/12 0840  NA 141  K 4.3  CL 104  CO2 28  GLUCOSE 113*  BUN 16  CREATININE 1.36*  CALCIUM 9.5    PT/INR:  Recent Labs  10/24/12 0840  LABPROT 13.0  INR 1.00   ABG:  INR: Will add last result for INR, ABG once components are confirmed Will add last 4 CBG results once components are confirmed  Assessment/Plan:  1. CV - History of non ischemic cardiomyopathy (s/p CRT-D). Management per heart failure team. 2.  Pulmonary - CXR this am shows cardiomegaly, large mediastinal paratracheal mass. Pathology of right paratracheal mass is pending. 3.Robitussin DM PRN cough 4.Discharge after seen by Dr. Donata Clay  ZIMMERMAN,DONIELLE MPA-C 10/26/2012,8:18 AM patient examined and medical record reviewed,agree with above note. [name],PETER 10/26/2012

## 2012-10-26 NOTE — Discharge Summary (Addendum)
Physician Discharge Summary       [address] [place]       [place] [address]             [phone]    Patient ID: RAHM MINIX MRN: [id number] DOB/AGE: Mar 02, 1961 52 y.o.  Admit date: 10/25/2012 Discharge date: 10/27/2012  Admission Diagnoses: 1. Right paratracheal mass 2.History of idiopathic cardiomyopathy 3.History of VT (s/p ICD) 4.History of hypertension 5.History of OSA 6.History of CKD (stage III) 7.History of bladder tumor  Discharge Diagnoses:  1. Right paratracheal mass 2.History of idiopathic cardiomyopathy 3.History of VT (s/p ICD) 4.History of hypertension 5.History of OSA 6.History of CKD (stage III) 7.History of bladder tumor  Procedure (s):  1. Video bronchoscopy.  2. Mediastinoscopy and biopsy of right paratracheal mass by Dr. Donata Clay.  Pathlogy: Results are pending  History of Presenting Illness: This is a 52 year old Afro-American male with idiopathic cardiomyopathy follow the heart failure clinic for several years with EF of 15% with maintained normal cardiac output and normal right heart pressures on medical therapy. AICD placed for ventricular arrhythmias has not shock or several months.  Patient presents today for evaluation of a large right paratracheal mass which was first seen on CT scan in 2008 with little change in 2010. However recent CT scan shows a significant change and tripled in size of the mass which is fairly solid and has some mild external compression of the trachea. E BUS perform a Dr. Delton Coombes showed atypical cells which were nondiagnostic. There is concern this represents a lymphoma and mediastinoscopy was recommended.  The patient denies any systemic symptoms, no chest pain no fever, no weight loss. Patient still able to work full-time for the [employer] division .  The patient is a nonsmoker. No family history lung cancer. No endobronchial lesions noted at the time of EBUS   Brief Hospital  Course:  Patient has remained a febrile and hemodynamically stable. His only complaint is cough. His chest ray this morning shows large, right paratracheal mediastinal mass and no pneumothorax. Pathology results are pending. He is tolerating a diet. He is surgically stable for discharge today.  Latest Vital Signs: Blood pressure 109/56, pulse 88, temperature 99.1 F (37.3 C), temperature source Oral, resp. rate 24, SpO2 93.00%.  Physical Exam: Cardiovascular: RRR  Pulmonary: Clear to auscultation bilaterally; no rales, wheezes, or rhonchi.  Abdomen: Soft, non tender, bowel sounds present.  Wounds: Clean and dry. No erythema or signs of infection.   Discharge Condition:Stable  Recent laboratory studies:  Lab Results  Component Value Date   WBC 9.4 10/24/2012   HGB 16.6 10/24/2012   HCT 47.2 10/24/2012   MCV 87.1 10/24/2012   PLT 155 10/24/2012   Lab Results  Component Value Date   NA 141 10/24/2012   K 4.3 10/24/2012   CL 104 10/24/2012   CO2 28 10/24/2012   CREATININE 1.36* 10/24/2012   GLUCOSE 113* 10/24/2012      Diagnostic Studies: Dg Chest 2 View  10/26/2012   *RADIOLOGY REPORT*  Clinical Data: Mediastinoscopy  CHEST - 2 VIEW  Comparison: 10/25/2012  Findings: A defibrillator is again noted.  Cardiac shadow is enlarged.  A large right paratracheal mediastinal mass is again seen with compression upon the trachea.  The lungs are well-aerated bilaterally.  The changes seen previously have resolved in the interval.  IMPRESSION: Improved aeration bilaterally.  Persistent mediastinal mass with tracheal compression.   Original Report Authenticated By: Alcide Clever, M.D.  Ct Head W Wo Contrast  10/25/2012   CLINICAL DATA:  Headache.  Mediastinal mass.  EXAM: CT HEAD WITHOUT AND WITH CONTRAST  TECHNIQUE: Contiguous axial images were obtained from the base of the skull through the vertex without and with intravenous contrast  CONTRAST:  70mL OMNIPAQUE IOHEXOL 300 MG/ML  SOLN  COMPARISON:   Non  FINDINGS: An abnormal 2.0 x 1.9 cm region of hypodensity in the right frontal lobe on image 15 of series 2 has a very similar appearance without obvious enhancement on post-contrast images The brainstem, cerebellum, cerebral peduncles, thalamus, basal ganglia, basilar cisterns, and ventricular system appear within normal limits. No intracranial hemorrhage is observed.  IMPRESSION: 1. Abnormal 2 cm right frontal lobe hypodensity without obvious enhancement. Differential diagnostic considerations include prior (subacute or chronic) infarct or low grade neoplasm. The lack of enhancement argues against active infection. I note the presence of the AICD which probably precludes MRI imaging. Correlate with any history of stroke or brain injury ; careful imaging followup is suggested.   Electronically Signed   By: Herbie Baltimore   On: 10/25/2012 17:20    Future Appointments Provider Department Dept Phone   10/31/2012 4:00 PM Kerin Perna, MD [facility] [place] [phone]   11/01/2012 9:15 AM Leslye Peer, MD [facility] [phone]   11/07/2012 3:00 PM Kerin Perna, MD [facility] [place] [phone]   12/17/2012 8:55 AM [facility] Main Office [place]) [phone]      Discharge Medications:   Medication List         atorvastatin 40 MG tablet  Commonly known as:  LIPITOR  Take 40 mg by mouth at bedtime.     carvedilol 12.5 MG tablet  Commonly known as:  COREG  Take 1.5 tablets (18.75 mg total) by mouth 2 (two) times daily with a meal.     colchicine 0.6 MG tablet  Take 0.6 mg by mouth daily as needed (gout).     digoxin 0.25 MG tablet  Commonly known as:  LANOXIN  Take 0.125 mg by mouth daily.     febuxostat 40 MG tablet  Commonly known as:  ULORIC  Take 40 mg by mouth daily.     furosemide 40 MG tablet  Commonly known as:  LASIX  Take 40 mg by mouth 3 (three)  times a week. Monday, Wednesday and Friday     guaiFENesin-dextromethorphan 100-10 MG/5ML syrup  Commonly known as:  ROBITUSSIN DM  Take 5 mLs by mouth every 4 (four) hours as needed for cough.     HYDROcodone-acetaminophen 5-325 MG per tablet  Commonly known as:  NORCO/VICODIN  Take 1-2 tablets by mouth every 4 (four) hours as needed for pain.     mupirocin ointment 2 %  Commonly known as:  BACTROBAN  Apply topically 2 (two) times daily. INSERT INTO EACH NOSTRIL TWICE DAILY AND MASSAGE     olmesartan 20 MG tablet  Commonly known as:  BENICAR  Take 20 mg by mouth daily.     OVER THE COUNTER MEDICATION  Place 1 drop into both eyes daily as needed (red eyes). Over the counter eye drops for redness        Follow Up Appointments:     Follow-up Information   Follow up with VAN Dinah Beers, MD. (PA/LAT CXR to be taken (at [facility] which is in the same building as Dr. Zenaida Niece Trigt's office) on 11/07/2012 at 2:00 pm;Appointment  with Dr. Donata Clay is on /05/2012 at 3:00 pm)    Specialty:  Cardiothoracic Surgery   Contact information:   [address] [employer] [address] [place] [address] [phone]       Follow up with [facility], NP. (Call for an appointment for the heart failure clinic in 1 week)    Specialty:  Nurse Practitioner   Contact information:   [address] 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 [name],PETER 10/26/2012

## 2012-10-29 ENCOUNTER — Telehealth: Payer: Self-pay | Admitting: Emergency Medicine

## 2012-10-29 ENCOUNTER — Other Ambulatory Visit: Payer: Self-pay | Admitting: *Deleted

## 2012-10-29 DIAGNOSIS — J398 Other specified diseases of upper respiratory tract: Secondary | ICD-10-CM

## 2012-10-29 NOTE — Telephone Encounter (Signed)
Dr. Delton Coombes I do not see where CPAP was addressed during pt visit. Please advise if okay to order new cpap for pt thanks

## 2012-10-30 ENCOUNTER — Other Ambulatory Visit: Payer: Self-pay | Admitting: *Deleted

## 2012-10-30 ENCOUNTER — Telehealth: Payer: Self-pay | Admitting: Emergency Medicine

## 2012-10-30 DIAGNOSIS — R222 Localized swelling, mass and lump, trunk: Secondary | ICD-10-CM

## 2012-10-30 NOTE — Telephone Encounter (Signed)
LMTCB

## 2012-10-30 NOTE — Telephone Encounter (Signed)
Last ov AVS states needs 1 mo rov  LMTCB

## 2012-10-30 NOTE — Telephone Encounter (Signed)
Returning call can be reached at [phone].[name]

## 2012-10-30 NOTE — Telephone Encounter (Signed)
I don;t have any of his CPAP or PSG info, don;t think we ordered it originally. We can help him, but since Im not the ordering MD we will need to get all the data, send to his DME. At the least we ned to see him for OV to coordinate. See if we can get the records and then have OV

## 2012-10-31 ENCOUNTER — Ambulatory Visit (INDEPENDENT_AMBULATORY_CARE_PROVIDER_SITE_OTHER): Payer: Self-pay | Admitting: Cardiothoracic Surgery

## 2012-10-31 ENCOUNTER — Encounter: Payer: Self-pay | Admitting: Cardiothoracic Surgery

## 2012-10-31 VITALS — BP 118/76 | HR 79 | Resp 16 | Ht 71.0 in | Wt 252.0 lb

## 2012-10-31 DIAGNOSIS — J398 Other specified diseases of upper respiratory tract: Secondary | ICD-10-CM

## 2012-10-31 DIAGNOSIS — R222 Localized swelling, mass and lump, trunk: Secondary | ICD-10-CM

## 2012-10-31 NOTE — Telephone Encounter (Signed)
Spoke with Melissa at [facility] and advised her of pts last ov with [facility] and last sleep study done in 2007.  She is going to check and see if pt will need new sleep study and if Dr Delton Coombes can even order new machine for pt.

## 2012-10-31 NOTE — Telephone Encounter (Signed)
Pt had mediastinoscopy by Dr Morton Peters last week and per pt results were negative.  Pt wants to know if he still needs to follow up with Dr Delton Coombes regarding this or just f/u with Dr Morton Peters. Pt also wants to know even though results were negative , how will he get rid of what is there.  Please advise

## 2012-10-31 NOTE — Telephone Encounter (Signed)
LM for Melissa with [facility] to call regarding CPAP issues

## 2012-10-31 NOTE — Telephone Encounter (Signed)
Spoke with pt and he states that he seen Dr Shelle Iron for sleep apnea over 5 years ago and that is who ordered his sleep study.  Chart reviewed and pt last seen Dr Shelle Iron  09/06/2007 and last sleep study was 05/02/2005.  Will await call from Melissa from [facility] to see if these results are to old  And what we need to do from here.

## 2012-10-31 NOTE — Progress Notes (Signed)
PCP is Mickie Hillier, MD Referring Provider is Catha Gosselin, MD  Chief Complaint  Patient presents with  . Routine Post Op    s/p BRONCHOSCOPY, MEDIASTINOSCOPY AND BX of MEDIASTINAL MASS    HPI: The patient had a mediastinoscopy and biopsy of a right paratracheal mass last week. The mass had grown slowly but significantly over a period of 5-7 years. There is low activity on PET scan. Biopsies returned normal thyroid tissue--consistent with a sub-sternal goiter. We'll confirm that the masses thyroid tissue with a thyroid scan and then if confirmed referred to internal medicine- endocrinology for possible medical therapy to reduce the size of the goiter. There is some tracheal pressure seen on CT scan the patient is asymptomatic and at bronchoscopy no significant tracheal narrowing was noted The patient has idiopathic cardiomyopathy with ejection fraction of 15% would be very high-risk for surgical extirpation of a sub-sternal goiter  Past Medical History  Diagnosis Date  . Gout 08/03/12    PT C/O OF RIGHT KNEE PAIN AND SWELLING -STATES GOUT FLARE UP - ONGOING SINCE APRIL - BUT SWELLING W/IN LAST WEEK  . Cardiomyopathy     Idiopathic dilated;   Marland Kitchen Ventricular tachycardia     s/p ICD  . Sleep apnea     CPAP  . Systolic CHF     EF 15%  . CKD (chronic kidney disease)     stage III baseline Crt 1.8-2.0  . Biventricular ICD (implantable cardiac defibrillator) Medtronic ]     DOI 2008/ upgrade 2010/ Gen Change 2014  . Automatic implantable cardioverter-defibrillator in situ   . CHF (congestive heart failure)   . Bladder tumor     PT HOSP AT [facility] 4/24 TO 4/26 2014 WITH UTI--AND FOUND TO HAVE BLADDER TUMOR-  . Arthritis     GOUT  . Hilar mass     Noted CT 2010  . HTN (hypertension)     Dr. Graciela Husbands cardiologist    Past Surgical History  Procedure Laterality Date  . Cholecystectomy    . Cardiac catheterization      he was found to have normal coronary arteries but with a globally  dilated and hypocontractile heart  . US echocardiography  03-17-2008, 07-03-2006    EF 15-20%, EF 15-20%  . Transurethral resection of bladder tumor N/A 08/13/2012    Procedure: TRANSURETHRAL RESECTION OF BLADDER TUMOR (TURBT);  Surgeon: Garnett Farm, MD;  Location: [facility] ORS;  Service: Urology;  Laterality: N/A;  MITOMYCIN C  . Icd insertion  04/2012-new ICD  . Endobronchial ultrasound Bilateral 10/01/2012    Procedure: ENDOBRONCHIAL ULTRASOUND;  Surgeon: Leslye Peer, MD;  Location: [facility] ENDOSCOPY;  Service: Cardiopulmonary;  Laterality: Bilateral;  . Cornea replacement    . Video bronchoscopy N/A 10/25/2012    Procedure: VIDEO BRONCHOSCOPY;  Surgeon: Kerin Perna, MD;  Location[facility] OR;  Service: Thoracic;  Laterality: N/A;  . Mediastinoscopy N/A 10/25/2012    Procedure: MEDIASTINOSCOPY;  Surgeon: Kerin Perna, MD;  Location: [facility] OR;  Service: Thoracic;  Laterality: N/A;    History reviewed. No pertinent family history.  Social History History  Substance Use Topics  . Smoking status: Never Smoker   . Smokeless tobacco: Never Used  . Alcohol Use: No    Current Outpatient Prescriptions  Medication Sig Dispense Refill  . atorvastatin (LIPITOR) 40 MG tablet Take 40 mg by mouth at bedtime.       . carvedilol (COREG) 12.5 MG tablet Take 1.5 tablets (18.75 mg total) by  mouth 2 (two) times daily with a meal.  90 tablet  3  . colchicine 0.6 MG tablet Take 0.6 mg by mouth daily as needed (gout).      Marland Kitchen digoxin (LANOXIN) 0.25 MG tablet Take 0.125 mg by mouth daily.       . febuxostat (ULORIC) 40 MG tablet Take 40 mg by mouth daily.       . furosemide (LASIX) 40 MG tablet Take 40 mg by mouth 3 (three) times a week. Monday, Wednesday and Friday      . guaiFENesin-dextromethorphan (ROBITUSSIN DM) 100-10 MG/5ML syrup Take 5 mLs by mouth every 4 (four) hours as needed for cough.  118 mL  0  . olmesartan (BENICAR) 20 MG tablet Take 20 mg by mouth daily.       Marland Kitchen OVER THE COUNTER MEDICATION Place 1  drop into both eyes daily as needed (red eyes). Over the counter eye drops for redness       No current facility-administered medications for this visit.    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 We'll follow up with a thyroid scan then medical therapy to reduce the size of the goiter  Plan: Return to review results of thyroid scan October 15

## 2012-10-31 NOTE — Telephone Encounter (Signed)
Pt aware and appt scheduled. Nothing further needed 

## 2012-10-31 NOTE — Telephone Encounter (Signed)
I think he should keep his follow up appointment with me so that we can discuss the results and any future plans. The biopsy results are good news I would predict that his swollen lymph nodes will resolve.

## 2012-10-31 NOTE — Telephone Encounter (Signed)
Melissa returned call.  She stated in order for pt to qualify for a replacement CPAP, he will need to be seen in the office by a sleep physician.  The note will need to state that pt is using and benefiting from his current CPAP therapy with a new order for replacement CPAP w/ pressure settings.  Called spoke with patient, informed him of the above.  Since it has been > 60yrs since last ov, will need to be sleep consult.  [facility] first available is not until 9.26.14 -- no sooner openings w/ any other of the sleep docs.  Pt stated that his CPAP machine is not working at all and is concerned with waiting another month to be seen.  Pt asked if seeing his PCP would be an option.  Advised pt that he will need to contact his PCP and/or insurance.  Appt has been scheduled with [facility] for 9.26.14 @ 11am and pt is aware that he may call to check for sooner appt or cancel if his PCP can see him sooner and order his CPAP.    Pt okay with this and verbalized his understanding.  Pt denied any other questions/concerns at this time.  Will sign off.

## 2012-11-01 ENCOUNTER — Ambulatory Visit: Payer: 59 | Admitting: Emergency Medicine

## 2012-11-02 ENCOUNTER — Ambulatory Visit: Payer: 59 | Admitting: Emergency Medicine

## 2012-11-07 ENCOUNTER — Ambulatory Visit: Payer: 59 | Admitting: Cardiothoracic Surgery

## 2012-11-09 ENCOUNTER — Ambulatory Visit (HOSPITAL_COMMUNITY)
Admit: 2012-11-09 | Discharge: 2012-11-09 | Disposition: A | Discharge status: Home / Self Care | Payer: 59 | Attending: Internal Medicine | Admitting: Internal Medicine

## 2012-11-09 ENCOUNTER — Other Ambulatory Visit: Payer: Self-pay | Admitting: Family Medicine

## 2012-11-09 ENCOUNTER — Encounter (HOSPITAL_COMMUNITY): Payer: Self-pay

## 2012-11-09 VITALS — BP 122/88 | HR 96 | Wt 257.8 lb

## 2012-11-09 DIAGNOSIS — N419 Inflammatory disease of prostate, unspecified: Secondary | ICD-10-CM | POA: Insufficient documentation

## 2012-11-09 DIAGNOSIS — C679 Malignant neoplasm of bladder, unspecified: Secondary | ICD-10-CM | POA: Insufficient documentation

## 2012-11-09 DIAGNOSIS — Z79899 Other long term (current) drug therapy: Secondary | ICD-10-CM | POA: Insufficient documentation

## 2012-11-09 DIAGNOSIS — Z95810 Presence of automatic (implantable) cardiac defibrillator: Secondary | ICD-10-CM | POA: Insufficient documentation

## 2012-11-09 DIAGNOSIS — I5022 Chronic systolic (congestive) heart failure: Secondary | ICD-10-CM | POA: Insufficient documentation

## 2012-11-09 DIAGNOSIS — I5042 Chronic combined systolic (congestive) and diastolic (congestive) heart failure: Secondary | ICD-10-CM

## 2012-11-09 DIAGNOSIS — I129 Hypertensive chronic kidney disease with stage 1 through stage 4 chronic kidney disease, or unspecified chronic kidney disease: Secondary | ICD-10-CM | POA: Insufficient documentation

## 2012-11-09 DIAGNOSIS — I428 Other cardiomyopathies: Secondary | ICD-10-CM | POA: Insufficient documentation

## 2012-11-09 DIAGNOSIS — M109 Gout, unspecified: Secondary | ICD-10-CM | POA: Insufficient documentation

## 2012-11-09 DIAGNOSIS — G4733 Obstructive sleep apnea (adult) (pediatric): Secondary | ICD-10-CM | POA: Insufficient documentation

## 2012-11-09 DIAGNOSIS — N183 Chronic kidney disease, stage 3 unspecified: Secondary | ICD-10-CM | POA: Insufficient documentation

## 2012-11-09 DIAGNOSIS — E049 Nontoxic goiter, unspecified: Secondary | ICD-10-CM | POA: Insufficient documentation

## 2012-11-09 DIAGNOSIS — I5020 Unspecified systolic (congestive) heart failure: Secondary | ICD-10-CM | POA: Insufficient documentation

## 2012-11-09 DIAGNOSIS — R930 Abnormal findings on diagnostic imaging of skull and head, not elsewhere classified: Secondary | ICD-10-CM

## 2012-11-09 DIAGNOSIS — I509 Heart failure, unspecified: Secondary | ICD-10-CM

## 2012-11-09 NOTE — Progress Notes (Signed)
Patient ID: TRISTEN LUCE, male   DOB: 12/08/1960, 52 y.o.   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 He also has prostatitis, and OSA (CPAP). LHC 2008: Normal cors.  He is intoelrant to numerous HF meds including spiro (severe hyperkalemia - requiring HD), hydralazine (fuzzy-headed) and imdur (severe HAs).   [facility] 01/28/12  RA = 1  RV = 28/2/4  PA = 32/17 (22)  PCW = 6  Fick cardiac output/index = 5.1/2.2  Thermo cardiac output/index = 5.0/2.1  PVR = 4.0 [place]  FA sat = 98%  PA sat = 64%, 68%   11/29/11 ECHO EF 15% Diffuse hypokinesis. Features are consistent with a pseudonormal left ventricular filling pattern, with concomitant abnormal relaxation and increased filling pressure (grade 2 diastolic dysfunction).  Admitted to [facility] 01/26/2012 after his ICD fired 5 times Potassium >7.5 Required urgent dialysis. Discharged form [facility] 01/29/12. Discharge weight 250 pounds.   CPX test 12/13  Resting HR: 90 Peak HR: 150 % age predicted max HR: 89% BP rest: 121/88 BP peak: 189/112 Peak VO2: 15.2 % predicted peak VO2: 58.3% (When adjusted to the patient's ideal body weight of 176 lb (79.8 kg) the peak VO2 is 22 ml/kg (ibw)/min (63% of the ibw-adjusted predicted) VE/VCO2 slope: 26.4 OUES: 2.29 Peak RER: 1.05 Ventilatory Threshold: 12.3 % predicted peak VO2: 47.2%  He returns for follow up. Last time day time carvedilol was increased to 18.75 mg.  Denies SOB/PND/Orthopnea. Using loaner CPAP. Had ECHO at [facility] one month ago. Dr [name] and [facility] unable to to get to bladder tumor. He is seeking a second opinion at [facility] 11/20/12. Weight at home 250-252 pounds.  Taking medications as prescribed. Not exercising. Working full time.    ROS: All systems negative except as  listed in HPI, PMH and Problem List.  Past Medical History  Diagnosis Date  . Gout 08/03/12    PT C/O OF RIGHT KNEE PAIN AND SWELLING -STATES GOUT FLARE UP - ONGOING SINCE APRIL - BUT SWELLING W/IN LAST WEEK  . Cardiomyopathy     Idiopathic dilated;   Marland Kitchen Ventricular tachycardia     s/p ICD  . Sleep apnea     CPAP  . Systolic CHF     EF 15%  . CKD (chronic kidney disease)     stage III baseline Crt 1.8-2.0  . Biventricular ICD (implantable cardiac defibrillator) Medtronic ]     DOI 2008/ upgrade 2010/ Gen Change 2014  . Automatic implantable cardioverter-defibrillator in situ   . CHF (congestive heart failure)   . Bladder tumor     PT HOSP AT [facility] 4/24 TO 4/26 2014 WITH UTI--AND FOUND TO HAVE BLADDER TUMOR-  . Arthritis     GOUT  . Hilar mass     Noted CT 2010  . HTN (hypertension)     Dr. Graciela Husbands cardiologist    Current Outpatient Prescriptions  Medication Sig Dispense Refill  . atorvastatin (LIPITOR) 40 MG tablet Take 40 mg by mouth at bedtime.       . carvedilol (COREG) 12.5 MG tablet Take 1.5 tablets (18.75 mg total) by mouth 2 (two) times daily with a meal.  90 tablet  3  . colchicine 0.6 MG tablet Take 0.6 mg by mouth daily as needed (gout).      Marland Kitchen  digoxin (LANOXIN) 0.25 MG tablet Take 0.125 mg by mouth daily.       . febuxostat (ULORIC) 40 MG tablet Take 40 mg by mouth daily.       . furosemide (LASIX) 40 MG tablet Take 40 mg by mouth 3 (three) times a week. Monday, Wednesday and Friday      . olmesartan (BENICAR) 20 MG tablet Take 20 mg by mouth daily.       Marland Kitchen OVER THE COUNTER MEDICATION Place 1 drop into both eyes daily as needed (red eyes). Over the counter eye drops for redness       No current facility-administered medications for this encounter.    PHYSICAL EXAM: Filed Vitals:   11/09/12 0907  BP: 122/88  Pulse: 96  Weight: 257 lb 12.8 oz (116.937 kg)  SpO2: 97%   General:  Well appearing. No resp difficulty HEENT: normal Neck: supple. JVP flat. Carotids  2+ bilaterally; no bruits. No lymphadenopathy or thryomegaly appreciated. R neck scar  Cor: PMI normal. Regular rate & rhythm. No rubs, gallops or murmurs.L upper chest scar ICD Lungs: clear Abdomen: obese, soft, nontender, nondistended. No hepatosplenomegaly. No bruits or masses. Good bowel sounds. Extremities: no cyanosis, clubbing, rash, edema Neuro: alert & orientedx3, cranial nerves grossly intact. Moves all 4 extremities w/o difficulty. Affect pleasant.   ASSESSMENT & PLAN:  1) Chronic systolic HF, NICM ECHO 11/2011 EF 15% (he had repeat ECHO at [facility] and will get results). S/P ICD  - NYHA II. Volume status good. Continue lasix M-W-F - Continue carvedilol to 18.75 mg bid.  Continue digoxin 0.125 mg daily  -Continue benicar 20 mg daily - Reinforced the need and importance of daily weights, a low sodium diet, and fluid restriction (less than 2 L a day). Instructed to call the HF clinic if weight increases more than 3 lbs overnight or 5 lbs in a week.   2) OSA -Continue CPAP.   3) Goiter Follow up encrinologist per Dr Clarene Duke   4) Bladder Cancer He is seeking a second optinion at [facility].   Follow up in 3 months  CLEGG,AMYNP-C 9:12 AM

## 2012-11-09 NOTE — Patient Instructions (Addendum)
Follow up in 3 months   Do the following things EVERYDAY: 1) Weigh yourself in the morning before breakfast. Write it down and keep it in a log. 2) Take your medicines as prescribed 3) Eat low salt foods-Limit salt (sodium) to 2000 mg per day.  4) Stay as active as you can everyday 5) Limit all fluids for the day to less than 2 liters  

## 2012-11-13 ENCOUNTER — Ambulatory Visit
Admission: RE | Admit: 2012-11-13 | Discharge: 2012-11-13 | Disposition: A | Discharge status: Home / Self Care | Payer: 59 | Source: Ambulatory Visit | Attending: Family Medicine | Admitting: Family Medicine

## 2012-11-13 ENCOUNTER — Other Ambulatory Visit: Payer: 59

## 2012-11-13 DIAGNOSIS — R930 Abnormal findings on diagnostic imaging of skull and head, not elsewhere classified: Secondary | ICD-10-CM

## 2012-11-19 ENCOUNTER — Telehealth: Payer: Self-pay | Admitting: Internal Medicine

## 2012-11-19 NOTE — Telephone Encounter (Signed)
Patient asking for letter stating it is ok for him to have procedures (in general) - he says that dr Graciela Husbands has given to this to him before. I asked him to contact his new physician's office and ask them to fax any procedural requests to our office and we can address them. Patient agreeable to plan

## 2012-11-19 NOTE — Telephone Encounter (Signed)
New problem   Pt need to speak to nurse concerning a procedure he is needing. Please call pt

## 2012-11-22 ENCOUNTER — Other Ambulatory Visit (HOSPITAL_COMMUNITY): Payer: Self-pay

## 2012-11-22 ENCOUNTER — Other Ambulatory Visit: Payer: Self-pay | Admitting: Internal Medicine

## 2012-11-22 DIAGNOSIS — E049 Nontoxic goiter, unspecified: Secondary | ICD-10-CM

## 2012-11-23 ENCOUNTER — Ambulatory Visit
Admission: RE | Admit: 2012-11-23 | Discharge: 2012-11-23 | Disposition: A | Discharge status: Home / Self Care | Payer: 59 | Source: Ambulatory Visit | Attending: Internal Medicine | Admitting: Internal Medicine

## 2012-11-23 DIAGNOSIS — E049 Nontoxic goiter, unspecified: Secondary | ICD-10-CM

## 2012-11-30 ENCOUNTER — Institutional Professional Consult (permissible substitution): Payer: 59 | Admitting: Pulmonary Disease

## 2012-12-10 ENCOUNTER — Encounter (HOSPITAL_COMMUNITY): Admission: RE | Admit: 2012-12-10 | Payer: 59 | Source: Ambulatory Visit

## 2012-12-10 ENCOUNTER — Institutional Professional Consult (permissible substitution): Payer: 59 | Admitting: Pulmonary Disease

## 2012-12-11 ENCOUNTER — Encounter (HOSPITAL_COMMUNITY): Payer: 59

## 2012-12-13 ENCOUNTER — Encounter: Payer: Self-pay | Admitting: Internal Medicine

## 2012-12-14 ENCOUNTER — Encounter: Payer: Self-pay | Admitting: Pulmonary Disease

## 2012-12-14 ENCOUNTER — Ambulatory Visit (INDEPENDENT_AMBULATORY_CARE_PROVIDER_SITE_OTHER): Payer: 59 | Admitting: Pulmonary Disease

## 2012-12-14 VITALS — BP 122/84 | HR 87 | Temp 98.9°F | Ht 70.0 in | Wt 263.0 lb

## 2012-12-14 DIAGNOSIS — G4733 Obstructive sleep apnea (adult) (pediatric): Secondary | ICD-10-CM

## 2012-12-14 NOTE — Progress Notes (Signed)
Subjective:    Patient ID: [name], male    DOB: November 19, 1960, 52 y.o.   MRN: [id number]  HPI The patient is a 52 year old male who comes in today to reestablish for management of obstructive sleep apnea.  He was diagnosed with moderate OSA in 2007, with an AHI of 20 events per hour.  He was started on CPAP, and did very well with the device.  The patient has been lost to followup, and I have not seen him since 2009.  He tells me that he has done well with CPAP, and uses a fullface mask.  Recently, his CPAP machine quit functioning, and he is currently using a loner CPAP device from his home care company.  He feels that he sleeps fairly well with the device, and feels rested in the mornings upon arising.  He has fairly good alertness during the day, but he can fall asleep in front of his computer at times.  The patient states that his weight is up 10 pounds over the last one or 2 years.   Sleep Questionnaire What time do you typically go to bed?( Between what hours) 11 pm 11 pm at 1414 on 12/14/12 by Christen Butter, CMA How long does it take you to fall asleep? 10-12min 10-15min at 1414 on 12/14/12 by Christen Butter, CMA How many times during the night do you wake up? 1 1 at 1414 on 12/14/12 by Christen Butter, CMA What time do you get out of bed to start your day? 0600 0600 at 1414 on 12/14/12 by Christen Butter, CMA Do you drive or operate heavy machinery in your occupation? No No at 1414 on 12/14/12 by Christen Butter, CMA How much has your weight changed (up or down) over the past two years? (In pounds) 10 lb (4.536 kg) 10 lb (4.536 kg) at 1414 on 12/14/12 by Christen Butter, CMA Have you ever had a sleep study before? Yes Yes at 1414 on 12/14/12 by Christen Butter, CMA If yes, location of study? [facility]  at 1414 on 12/14/12 by Christen Butter, CMA If yes, date of study? 2009 ? 2009 ? at 1414 on 12/14/12 by Christen Butter, CMA Do you currently use CPAP? Yes Yes at  1414 on 12/14/12 by Christen Butter, CMA If so, what pressure? ? ? at 1414 on 12/14/12 by Christen Butter, CMA Do you wear oxygen at any time? No No at 1414 on 12/14/12 by Christen Butter, CMA   Review of Systems  Constitutional: Negative for fever and unexpected weight change.  HENT: Negative for congestion, dental problem, ear pain, nosebleeds, postnasal drip, rhinorrhea, sinus pressure, sneezing, sore throat and trouble swallowing.   Eyes: Negative for redness and itching.  Respiratory: Negative for cough, chest tightness, shortness of breath and wheezing.   Cardiovascular: Negative for palpitations and leg swelling.  Gastrointestinal: Negative for nausea and vomiting.  Genitourinary: Negative for dysuria.  Musculoskeletal: Negative for joint swelling.  Skin: Negative for rash.  Neurological: Negative for headaches.  Hematological: Does not bruise/bleed easily.  Psychiatric/Behavioral: Negative for dysphoric mood. The patient is not nervous/anxious.        Objective:   Physical Exam Constitutional:  Obese male, no acute distress  HENT:  Nares patent without discharge, but narrowed.   Oropharynx without exudate, palate and uvula are thickened and elongated.   Eyes:  Perrla, eomi, no scleral icterus  Neck:  No JVD, mild prominence of thyroid.  Cardiovascular:  Normal rate, regular rhythm, no rubs or gallops.  No murmurs        Intact distal pulses  Pulmonary :  Normal breath sounds, no stridor or respiratory distress   No rales, rhonchi, or wheezing  Abdominal:  Soft, nondistended, bowel sounds present.  No tenderness noted.   Musculoskeletal:  mild lower extremity edema noted.  Lymph Nodes:  No cervical lymphadenopathy noted  Skin:  No cyanosis noted  Neurologic:  Alert, appropriate, moves all 4 extremities without obvious deficit.         Assessment & Plan:

## 2012-12-14 NOTE — Patient Instructions (Signed)
Will order a new machine for you, and will also use the auto setting to re-optimize your pressure.   Keep up with mask changes and supplies.  Work on weight loss followup with me in one year, but please call if having any issues.

## 2012-12-14 NOTE — Assessment & Plan Note (Signed)
The patient has done very well on CPAP until most recently, when his CPAP machine quit functioning.  I will order him a new device, and will also take this opportunity to optimize his pressure since he is having some breakthrough symptoms.  I stressed to him the importance of keeping up with his mask changes and supplies, as well as working aggressively on weight loss.  I also reminded him that he needs to follow up with me on a yearly basis.

## 2012-12-17 ENCOUNTER — Encounter: Payer: 59 | Admitting: *Deleted

## 2012-12-19 ENCOUNTER — Ambulatory Visit: Payer: 59 | Admitting: Cardiothoracic Surgery

## 2012-12-20 ENCOUNTER — Other Ambulatory Visit (HOSPITAL_COMMUNITY): Payer: Self-pay | Admitting: Internal Medicine

## 2012-12-20 DIAGNOSIS — E049 Nontoxic goiter, unspecified: Secondary | ICD-10-CM

## 2012-12-21 ENCOUNTER — Ambulatory Visit (INDEPENDENT_AMBULATORY_CARE_PROVIDER_SITE_OTHER): Payer: 59 | Admitting: *Deleted

## 2012-12-21 DIAGNOSIS — I428 Other cardiomyopathies: Secondary | ICD-10-CM

## 2012-12-21 DIAGNOSIS — I509 Heart failure, unspecified: Secondary | ICD-10-CM

## 2012-12-21 DIAGNOSIS — Z95810 Presence of automatic (implantable) cardiac defibrillator: Secondary | ICD-10-CM

## 2012-12-21 DIAGNOSIS — I472 Ventricular tachycardia: Secondary | ICD-10-CM

## 2012-12-21 DIAGNOSIS — I5042 Chronic combined systolic (congestive) and diastolic (congestive) heart failure: Secondary | ICD-10-CM

## 2012-12-21 DIAGNOSIS — I4729 Other ventricular tachycardia: Secondary | ICD-10-CM

## 2012-12-22 ENCOUNTER — Encounter: Payer: Self-pay | Admitting: Internal Medicine

## 2012-12-25 ENCOUNTER — Other Ambulatory Visit: Payer: Self-pay | Admitting: Dermatology

## 2012-12-31 ENCOUNTER — Encounter: Payer: Self-pay | Admitting: *Deleted

## 2012-12-31 LAB — REMOTE ICD DEVICE
AL THRESHOLD: 0.5 V
ATRIAL PACING ICD: 0.07 pct
BATTERY VOLTAGE: 3.0094 V
DEV-0020ICD: NEGATIVE
FVT: 0
LV LEAD THRESHOLD: 0.625 V
PACEART VT: 0
RV LEAD THRESHOLD: 0.875 V
TZAT-0001FASTVT: 1
TZAT-0001FASTVT: 2
TZAT-0001SLOWVT: 1
TZAT-0001SLOWVT: 2
TZAT-0002ATACH: NEGATIVE
TZAT-0002ATACH: NEGATIVE
TZAT-0002ATACH: NEGATIVE
TZAT-0004FASTVT: 8
TZAT-0004FASTVT: 8
TZAT-0005FASTVT: 88 pct
TZAT-0005FASTVT: 91 pct
TZAT-0005SLOWVT: 88 pct
TZAT-0005SLOWVT: 91 pct
TZAT-0011FASTVT: 10 ms
TZAT-0011FASTVT: 10 ms
TZAT-0013FASTVT: 2
TZAT-0013FASTVT: 2
TZAT-0013SLOWVT: 2
TZAT-0013SLOWVT: 2
TZAT-0018ATACH: NEGATIVE
TZAT-0018FASTVT: NEGATIVE
TZAT-0018FASTVT: NEGATIVE
TZAT-0018SLOWVT: NEGATIVE
TZAT-0018SLOWVT: NEGATIVE
TZAT-0019ATACH: 6 V
TZAT-0019ATACH: 6 V
TZAT-0019SLOWVT: 8 V
TZAT-0019SLOWVT: 8 V
TZAT-0020ATACH: 1.5 ms
TZAT-0020SLOWVT: 1.5 ms
TZON-0003FASTVT: 300 ms
TZON-0003VSLOWVT: 450 ms
TZON-0004SLOWVT: 40
TZON-0005SLOWVT: 12
TZST-0001ATACH: 5
TZST-0001FASTVT: 3
TZST-0001FASTVT: 4
TZST-0001FASTVT: 6
TZST-0001SLOWVT: 3
TZST-0001SLOWVT: 5
TZST-0002ATACH: NEGATIVE
TZST-0002ATACH: NEGATIVE
TZST-0002SLOWVT: NEGATIVE
TZST-0003FASTVT: 35 J
VENTRICULAR PACING ICD: 99.83 pct
VF: 0

## 2013-01-02 ENCOUNTER — Ambulatory Visit (HOSPITAL_COMMUNITY)
Admission: RE | Admit: 2013-01-02 | Discharge: 2013-01-02 | Disposition: A | Discharge status: Home / Self Care | Payer: 59 | Source: Ambulatory Visit | Attending: Internal Medicine | Admitting: Internal Medicine

## 2013-01-02 ENCOUNTER — Encounter (HOSPITAL_COMMUNITY)
Admission: RE | Admit: 2013-01-02 | Discharge: 2013-01-02 | Disposition: A | Discharge status: Home / Self Care | Payer: 59 | Source: Ambulatory Visit | Attending: Cardiothoracic Surgery | Admitting: Cardiothoracic Surgery

## 2013-01-02 ENCOUNTER — Other Ambulatory Visit: Payer: Self-pay

## 2013-01-02 ENCOUNTER — Encounter: Payer: Self-pay | Admitting: Cardiothoracic Surgery

## 2013-01-02 ENCOUNTER — Encounter (INDEPENDENT_AMBULATORY_CARE_PROVIDER_SITE_OTHER): Payer: 59 | Admitting: Cardiothoracic Surgery

## 2013-01-02 VITALS — BP 140/92 | HR 102 | Wt 259.2 lb

## 2013-01-02 DIAGNOSIS — I5042 Chronic combined systolic (congestive) and diastolic (congestive) heart failure: Secondary | ICD-10-CM | POA: Insufficient documentation

## 2013-01-02 DIAGNOSIS — E049 Nontoxic goiter, unspecified: Secondary | ICD-10-CM

## 2013-01-02 DIAGNOSIS — E041 Nontoxic single thyroid nodule: Secondary | ICD-10-CM

## 2013-01-02 DIAGNOSIS — M109 Gout, unspecified: Secondary | ICD-10-CM

## 2013-01-02 DIAGNOSIS — R222 Localized swelling, mass and lump, trunk: Secondary | ICD-10-CM

## 2013-01-02 DIAGNOSIS — R918 Other nonspecific abnormal finding of lung field: Secondary | ICD-10-CM

## 2013-01-02 DIAGNOSIS — I509 Heart failure, unspecified: Secondary | ICD-10-CM

## 2013-01-02 DIAGNOSIS — G4733 Obstructive sleep apnea (adult) (pediatric): Secondary | ICD-10-CM | POA: Insufficient documentation

## 2013-01-02 LAB — COMPREHENSIVE METABOLIC PANEL
ALT: 19 U/L (ref 0–53)
AST: 21 U/L (ref 0–37)
Calcium: 9 mg/dL (ref 8.4–10.5)
Sodium: 143 mEq/L (ref 135–145)
Total Protein: 7.6 g/dL (ref 6.0–8.3)

## 2013-01-02 MED ORDER — FUROSEMIDE 40 MG PO TABS: 40.0000 mg | ORAL_TABLET | Freq: Every day | ORAL | Status: DC

## 2013-01-02 NOTE — Progress Notes (Signed)
Patient ID: [name], male   DOB: 10-28-1960, 52 y.o.   MRN: [id number] PCP: [name]  Nephrologist: [name]  Cardiologist: [name]  HPI: [name] is a 52 year old with a PMH of gout, nonischemic cardiomyopathy (EF 15%) S/P CRT-D implantation 2010, VT with amiodarone which was discontinued because of lightheadedness. He also has prostatitis, and OSA (CPAP). LHC 2008: Normal cors.  He is intoelrant to numerous HF meds including spiro (severe hyperkalemia - requiring HD), hydralazine (fuzzy-headed) and imdur (severe HAs). S/P 12/17/12 bladder cancer removal.   RHC 01/28/12  RA = 1  RV = 28/2/4  PA = 32/17 (22)  PCW = 6  Fick cardiac output/index = 5.1/2.2  Thermo cardiac output/index = 5.0/2.1  PVR = 4.0 Woods  FA sat = 98%  PA sat = 64%, 68%   11/29/11 ECHO EF 15% Diffuse hypokinesis. Features are consistent with a pseudonormal left ventricular filling pattern, with concomitant abnormal relaxation and increased filling pressure (grade 2 diastolic dysfunction).  Admitted to [facility] 01/26/2012 after his ICD fired 5 times Potassium >7.5 Required urgent dialysis. Discharged form [facility] 01/29/12. Discharge weight 250 pounds.   CPX test 12/13  Resting HR: 90 Peak HR: 150 % age predicted max HR: 89% BP rest: 121/88 BP peak: 189/112 Peak VO2: 15.2 % predicted peak VO2: 58.3% (When adjusted to the patient's ideal body weight of 176 lb (79.8 kg) the peak VO2 is 22 ml/kg (ibw)/min (63% of the ibw-adjusted predicted) VE/VCO2 slope: 26.4 OUES: 2.29 Peak RER: 1.05 Ventilatory Threshold: 12.3 % predicted peak VO2: 47.2%  Labs  12/12/12 dig level 0.9 12/18/12 Magnesium 1.1 K 4.0 Creatinine 1.5  He returns for follow up. He is recovering from bladder cancer surgery at [name] on  12/17/12. Denies SOB/PND/Orthopnea. Does admit to dyspnea with exertion.   Denies SOB/PND/Orthopnea.  Weight at home trending up to 255 pounds.  Taking medications as prescribed. Not exercising. Working full  time.    ROS: All systems negative except as listed in HPI, PMH and Problem List.  Past Medical History  Diagnosis Date  . Gout 08/03/12    PT C/O OF RIGHT KNEE PAIN AND SWELLING -STATES GOUT FLARE UP - ONGOING SINCE APRIL - BUT SWELLING W/IN LAST WEEK  . Cardiomyopathy     Idiopathic dilated;   Marland Kitchen Ventricular tachycardia     s/p ICD  . Sleep apnea     CPAP  . Systolic CHF     EF 15%  . CKD (chronic kidney disease)     stage III baseline Crt 1.8-2.0  . Biventricular ICD (implantable cardiac defibrillator) Medtronic ]     DOI 2008/ upgrade 2010/ Gen Change 2014  . Automatic implantable cardioverter-defibrillator in situ   . CHF (congestive heart failure)   . Bladder tumor     PT HOSP AT [facility] 4/24 TO 4/26 2014 WITH UTI--AND FOUND TO HAVE BLADDER TUMOR-  . Arthritis     GOUT  . Hilar mass     Noted CT 2010  . HTN (hypertension)     Jamie. Graciela [name] cardiologist    Current Outpatient Prescriptions  Medication Sig Dispense Refill  . atorvastatin (LIPITOR) 40 MG tablet Take 40 mg by mouth at bedtime.       . carvedilol (COREG) 12.5 MG tablet Take 1.5 tablets (18.75 mg total) by mouth 2 (two) times daily with a meal.  90 tablet  3  . colchicine 0.6 MG tablet Take 0.6 mg by mouth daily as  needed (gout).      Marland Kitchen digoxin (LANOXIN) 0.25 MG tablet Take 0.125 mg by mouth daily.       . febuxostat (ULORIC) 40 MG tablet Take 40 mg by mouth daily.       . furosemide (LASIX) 40 MG tablet Take 40 mg by mouth 3 (three) times a week. Monday, Wednesday and Friday      . olmesartan (BENICAR) 20 MG tablet Take 20 mg by mouth daily.       Marland Kitchen OVER THE COUNTER MEDICATION Place 1 drop into both eyes daily as needed (red eyes). Over the counter eye drops for redness       No current facility-administered medications for this encounter.    PHYSICAL EXAM: Filed Vitals:   01/02/13 1347  BP: 140/92  Pulse: 102  Weight: 259 lb 4 oz (117.595 kg)  SpO2: 98%   General:  Well appearing. No resp  difficulty HEENT: normal Neck: supple. JVP flat. Carotids 2+ bilaterally; no bruits. No lymphadenopathy or thryomegaly appreciated. R neck scar  Cor: PMI normal. Regular rate & rhythm. No rubs, gallops or murmurs.L upper chest scar ICD Lungs: clear Abdomen: obese, soft, nontender, nondistended. No hepatosplenomegaly. No bruits or masses. Good bowel sounds. Extremities: no cyanosis, clubbing, rash, edema Neuro: alert & orientedx3, cranial nerves grossly intact. Moves all 4 extremities w/o difficulty. Affect pleasant.   ASSESSMENT & PLAN:  1) Chronic systolic HF, NICM ECHO 11/2011 EF 15% (he had repeat ECHO at [facility] and will get results). S/P Medtronic Optivol  ICD 04/2012 .  - NYHA II. Volume status elevated. Increase lasix to 40 mg daily.  - Continue carvedilol to 18.75 mg bid.  Continue digoxin 0.125 mg daily  -Continue benicar 20 mg daily - Reinforced the need and importance of daily weights, a low sodium diet, and fluid restriction (less than 2 L a day). Instructed to call the HF clinic if weight increases more than 3 lbs overnight or 5 lbs in a week.  Optivol- Fluid trending up to threshold    Activity ~2 hours Check CMET today. Check magnesium level. Mag level in [place] was 1.1.Repeat BMET in 10 days  2) OSA -Continue CPAP 3) Goiter Follow up encrinologist per [name] and [name] 4) Bladder Cancer. S/P bladder cancer resection 12/17/12  5) Gout- Uric Acid 12.9 he stopped taking allopurinol. Resume allopurinol  Follow up in 3 weeks   [name],[name] 1:52 PM

## 2013-01-02 NOTE — Patient Instructions (Signed)
Follow up in 3 weeks  Take lasix 40 mg daily  Do the following things EVERYDAY: 1) Weigh yourself in the morning before breakfast. Write it down and keep it in a log. 2) Take your medicines as prescribed 3) Eat low salt foods-Limit salt (sodium) to 2000 mg per day.  4) Stay as active as you can everyday 5) Limit all fluids for the day to less than 2 liters

## 2013-01-03 ENCOUNTER — Encounter (HOSPITAL_COMMUNITY)
Admission: RE | Admit: 2013-01-03 | Discharge: 2013-01-03 | Disposition: A | Discharge status: Home / Self Care | Payer: 59 | Source: Ambulatory Visit | Attending: Cardiothoracic Surgery | Admitting: Cardiothoracic Surgery

## 2013-01-03 ENCOUNTER — Telehealth (HOSPITAL_COMMUNITY): Payer: Self-pay | Admitting: Adult Health

## 2013-01-03 ENCOUNTER — Encounter (HOSPITAL_COMMUNITY): Payer: Self-pay

## 2013-01-03 DIAGNOSIS — I5022 Chronic systolic (congestive) heart failure: Secondary | ICD-10-CM

## 2013-01-03 LAB — MAGNESIUM: Magnesium: 1.3 mg/dL — ABNORMAL LOW (ref 1.5–2.5)

## 2013-01-03 MED ORDER — SODIUM IODIDE I 131 CAPSULE
13.6000 | Freq: Once | INTRAVENOUS | Status: AC | PRN
  Administered 2013-01-02: 13.6 via ORAL

## 2013-01-03 MED ORDER — SODIUM PERTECHNETATE TC 99M INJECTION
10.4000 | Freq: Once | INTRAVENOUS | Status: AC | PRN
  Administered 2013-01-03: 10 via INTRAVENOUS

## 2013-01-03 MED ORDER — MAGNESIUM OXIDE 400 (241.3 MG) MG PO TABS: 400.0000 mg | ORAL_TABLET | Freq: Every day | ORAL | Status: DC

## 2013-01-03 NOTE — Telephone Encounter (Signed)
Provided lab results.   Magnesium 1.3 Start Mag Oxide 400 mg daily  Uric Acid 12.9 - instructed to restart allopurinol   Check Magnesium 01/10/13   Mr Brickle verbalized understanding.   CLEGG,AMY 4:34 PM

## 2013-01-04 NOTE — Addendum Note (Signed)
Addended by: Noralee Space on: 01/04/2013 01:07 PM   Modules accepted: Orders

## 2013-01-04 NOTE — Addendum Note (Signed)
Addended by: Noralee Space on: 01/04/2013 01:15 PM   Modules accepted: Orders

## 2013-01-09 ENCOUNTER — Ambulatory Visit (INDEPENDENT_AMBULATORY_CARE_PROVIDER_SITE_OTHER): Payer: 59 | Admitting: Cardiothoracic Surgery

## 2013-01-09 ENCOUNTER — Encounter: Payer: Self-pay | Admitting: Cardiothoracic Surgery

## 2013-01-09 VITALS — BP 145/90 | HR 96 | Resp 20 | Ht 71.0 in | Wt 259.0 lb

## 2013-01-09 DIAGNOSIS — Z09 Encounter for follow-up examination after completed treatment for conditions other than malignant neoplasm: Secondary | ICD-10-CM

## 2013-01-09 DIAGNOSIS — E049 Nontoxic goiter, unspecified: Secondary | ICD-10-CM

## 2013-01-09 NOTE — Progress Notes (Signed)
PCP is Mickie Hillier, MD Referring Provider is Catha Gosselin, MD  Chief Complaint  Patient presents with  . Routine Post Op    f/u to discuss Thyroid Scan 01/03/13    HPI: Very nice gentleman with idiopathic cardiomyopathy EF 15%. He developed enlarging right paratracheal mass and a cystoscopy with biopsy demonstrated normal thyroid tissue. Followup thyroid scan shows no evidence of thyroid cancer. 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 The patient was started on Synthroid to suppress his thyroid gland and quarter by his primary physician. He is doing well on working full-time. He denies wheezing or difficulty swallowing. Even though the subxiphoid goiter is fairly large, would not recommend resection due to the absence of symptoms in this patient with EF of 15%  Past Medical History  Diagnosis Date  . Gout 08/03/12    PT C/O OF RIGHT KNEE PAIN AND SWELLING -STATES GOUT FLARE UP - ONGOING SINCE APRIL - BUT SWELLING W/IN LAST WEEK  . Cardiomyopathy     Idiopathic dilated;   Marland Kitchen Ventricular tachycardia     s/p ICD  . Sleep apnea     CPAP  . Systolic CHF     EF 15%  . CKD (chronic kidney disease)     stage III baseline Crt 1.8-2.0  . Biventricular ICD (implantable cardiac defibrillator) Medtronic ]     DOI 2008/ upgrade 2010/ Gen Change 2014  . Automatic implantable cardioverter-defibrillator in situ   . CHF (congestive heart failure)   . Bladder tumor     PT HOSP AT [facility] 4/24 TO 4/26 2014 WITH UTI--AND FOUND TO HAVE BLADDER TUMOR-  . Arthritis     GOUT  . Hilar mass     Noted CT 2010  . HTN (hypertension)     Dr. Graciela Husbands cardiologist    Past Surgical History  Procedure Laterality Date  . Cholecystectomy    . Cardiac catheterization      he was found to have normal coronary arteries but with a globally dilated and hypocontractile heart  . US echocardiography  03-17-2008, 07-03-2006    EF 15-20%, EF 15-20%  . Transurethral  resection of bladder tumor N/A 08/13/2012    Procedure: TRANSURETHRAL RESECTION OF BLADDER TUMOR (TURBT);  Surgeon: Garnett Farm, MD;  Location: [facility] ORS;  Service: Urology;  Laterality: N/A;  MITOMYCIN C  . Icd insertion  04/2012-new ICD  . Endobronchial ultrasound Bilateral 10/01/2012    Procedure: ENDOBRONCHIAL ULTRASOUND;  Surgeon: Leslye Peer, MD;  Location: [facility] ENDOSCOPY;  Service: Cardiopulmonary;  Laterality: Bilateral;  . Cornea replacement    . Video bronchoscopy N/A 10/25/2012    Procedure: VIDEO BRONCHOSCOPY;  Surgeon: Kerin Perna, MD;  Location: [facility] OR;  Service: Thoracic;  Laterality: N/A;  . Mediastinoscopy N/A 10/25/2012    Procedure: MEDIASTINOSCOPY;  Surgeon: Kerin Perna, MD;  Location: [facility] OR;  Service: Thoracic;  Laterality: N/A;    No family history on file.  Social History History  Substance Use Topics  . Smoking status: Never Smoker   . Smokeless tobacco: Never Used  . Alcohol Use: No    Current Outpatient Prescriptions  Medication Sig Dispense Refill  . atorvastatin (LIPITOR) 40 MG tablet Take 40 mg by mouth at bedtime.       . carvedilol (COREG) 12.5 MG tablet Take 1.5 tablets (18.75 mg total) by mouth 2 (two) times daily with a meal.  90 tablet  3  . colchicine 0.6 MG  tablet Take 0.6 mg by mouth daily as needed (gout).      Marland Kitchen digoxin (LANOXIN) 0.25 MG tablet Take 0.125 mg by mouth daily.       . febuxostat (ULORIC) 40 MG tablet Take 40 mg by mouth daily.       . furosemide (LASIX) 40 MG tablet Take 1 tablet (40 mg total) by mouth daily. Monday, Wednesday and Friday  30 tablet  6  . levothyroxine (SYNTHROID, LEVOTHROID) 50 MCG tablet Take 50 mcg by mouth daily before breakfast.       . magnesium oxide (MAG-OX) 400 (241.3 MG) MG tablet Take 1 tablet (400 mg total) by mouth daily.  120 tablet  4  . olmesartan (BENICAR) 20 MG tablet Take 20 mg by mouth daily.       Marland Kitchen OVER THE COUNTER MEDICATION Place 1 drop into both eyes daily as needed (red eyes). Over the  counter eye drops for redness       No current facility-administered medications for this visit.    No Known Allergies  Review of Systems recently had a ladder tumor removed at [facility] transurethrally  BP 145/90  Pulse 96  Resp 20  Ht 5\' 11"  (1.803 m)  Wt 259 lb (117.482 kg)  BMI 36.14 kg/m2  SpO2 98% Physical Exam  Alert and comfortable Neck incision well-healed No neck masses palpable Is clear Diagnostic T ests: I reviewed the thyroid scan with the radiologist Dr. 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

## 2013-01-09 NOTE — Progress Notes (Signed)
This encounter was created in error - please disregard.

## 2013-01-10 ENCOUNTER — Other Ambulatory Visit (INDEPENDENT_AMBULATORY_CARE_PROVIDER_SITE_OTHER): Payer: 59

## 2013-01-10 ENCOUNTER — Encounter: Payer: Self-pay | Admitting: Internal Medicine

## 2013-01-10 DIAGNOSIS — I5022 Chronic systolic (congestive) heart failure: Secondary | ICD-10-CM

## 2013-01-11 ENCOUNTER — Encounter: Payer: Self-pay | Admitting: *Deleted

## 2013-01-22 ENCOUNTER — Encounter (HOSPITAL_COMMUNITY): Payer: Self-pay

## 2013-01-22 ENCOUNTER — Ambulatory Visit (HOSPITAL_COMMUNITY)
Admission: RE | Admit: 2013-01-22 | Discharge: 2013-01-22 | Disposition: A | Discharge status: Home / Self Care | Payer: 59 | Source: Ambulatory Visit | Attending: Internal Medicine | Admitting: Internal Medicine

## 2013-01-22 VITALS — BP 126/98 | HR 96 | Wt 258.1 lb

## 2013-01-22 DIAGNOSIS — E049 Nontoxic goiter, unspecified: Secondary | ICD-10-CM | POA: Insufficient documentation

## 2013-01-22 DIAGNOSIS — R5381 Other malaise: Secondary | ICD-10-CM | POA: Insufficient documentation

## 2013-01-22 DIAGNOSIS — I428 Other cardiomyopathies: Secondary | ICD-10-CM | POA: Insufficient documentation

## 2013-01-22 DIAGNOSIS — I509 Heart failure, unspecified: Secondary | ICD-10-CM | POA: Insufficient documentation

## 2013-01-22 DIAGNOSIS — I5022 Chronic systolic (congestive) heart failure: Secondary | ICD-10-CM

## 2013-01-22 DIAGNOSIS — R42 Dizziness and giddiness: Secondary | ICD-10-CM | POA: Insufficient documentation

## 2013-01-22 DIAGNOSIS — G4733 Obstructive sleep apnea (adult) (pediatric): Secondary | ICD-10-CM | POA: Insufficient documentation

## 2013-01-22 DIAGNOSIS — M109 Gout, unspecified: Secondary | ICD-10-CM

## 2013-01-22 MED ORDER — MAGNESIUM OXIDE 400 (241.3 MG) MG PO TABS: 400.0000 mg | ORAL_TABLET | Freq: Two times a day (BID) | ORAL | Status: DC

## 2013-01-22 NOTE — Progress Notes (Signed)
Patient ID: [name], male   DOB: November 09, 1960, 52 y.o.   MRN: [id number]  PCP: Dr Clarene Duke  Nephrologist: Dr [name]  Cardiologist: Dr Graciela Husbands  HPI: Mr Ligman is a 43 year old with a PMH of gout, nonischemic cardiomyopathy (EF 15%) S/P CRT-D implantation 2010, VT with amiodarone which was discontinued because of lightheadedness. He also has prostatitis, and OSA (CPAP). LHC 2008: Normal cors.  He is intoelrant to numerous HF meds including spiro (severe hyperkalemia - requiring HD), hydralazine (fuzzy-headed) and imdur (severe HAs). S/P 12/17/12 bladder cancer removal.   RHC 01/28/12  RA = 1  RV = 28/2/4  PA = 32/17 (22)  PCW = 6  Fick cardiac output/index = 5.1/2.2  Thermo cardiac output/index = 5.0/2.1  PVR = 4.0 Woods  FA sat = 98%  PA sat = 64%, 68%   11/29/11 ECHO EF 15% Diffuse hypokinesis. Features are consistent with a pseudonormal left ventricular filling pattern, with concomitant abnormal relaxation and increased filling pressure (grade 2 diastolic dysfunction).  Admitted to [facility] 01/26/2012 after his ICD fired 5 times Potassium >7.5 Required urgent dialysis. Discharged form [facility] 01/29/12. Discharge weight 250 pounds.   CPX test 12/13  Resting HR: 90 Peak HR: 150 % age predicted max HR: 89% BP rest: 121/88 BP peak: 189/112 Peak VO2: 15.2 % predicted peak VO2: 58.3% (When adjusted to the patient's ideal body weight of 176 lb (79.8 kg) the peak VO2 is 22 ml/kg (ibw)/min (63% of the ibw-adjusted predicted) VE/VCO2 slope: 26.4 OUES: 2.29 Peak RER: 1.05 Ventilatory Threshold: 12.3 % predicted peak VO2: 47.2%  Labs  12/12/12 dig level 0.9 12/18/12 Magnesium 1.1 K 4.0 Creatinine 1.5 01/02/13: Uric acid 12.9, K+ 3.9, Cr 1.37, AST 21, ALT 19, Mag 1.3  Follow up: Since last visit started on mag oxide 400 mg daily. Doing ok. Feeling fatigued for about a week. Denies SOB, PND, Orthopnea or CP. +post nasal gtt that leads to coughing and sometimes vomiting. Weight at home 255 lbs.  Taking medications as prescribed. Still not exercising. Working full time in [employer]ing [employer].  +dizziness.    ROS: All systems negative except as listed in HPI, PMH and Problem List.  Past Medical History  Diagnosis Date  . Gout 08/03/12    PT C/O OF RIGHT KNEE PAIN AND SWELLING -STATES GOUT FLARE UP - ONGOING SINCE APRIL - BUT SWELLING W/IN LAST WEEK  . Cardiomyopathy     Idiopathic dilated;   Marland Kitchen Ventricular tachycardia     s/p ICD  . Sleep apnea     CPAP  . Systolic CHF     EF 15%  . CKD (chronic kidney disease)     stage III baseline Crt 1.8-2.0  . Biventricular ICD (implantable cardiac defibrillator) Medtronic ]     DOI 2008/ upgrade 2010/ Gen Change 2014  . Automatic implantable cardioverter-defibrillator in situ   . CHF (congestive heart failure)   . Bladder tumor     PT HOSP AT [facility] 4/24 TO 4/26 2014 WITH UTI--AND FOUND TO HAVE BLADDER TUMOR-  . Arthritis     GOUT  . Hilar mass     Noted CT 2010  . HTN (hypertension)     Dr. Graciela Husbands cardiologist    Current Outpatient Prescriptions  Medication Sig Dispense Refill  . atorvastatin (LIPITOR) 40 MG tablet Take 40 mg by mouth at bedtime.       . carvedilol (COREG) 12.5 MG tablet Take 1.5 tablets (18.75 mg total) by mouth 2 (two) times daily  with a meal.  90 tablet  3  . colchicine 0.6 MG tablet Take 0.6 mg by mouth daily as needed (gout).      Marland Kitchen digoxin (LANOXIN) 0.25 MG tablet Take 0.125 mg by mouth daily.       . febuxostat (ULORIC) 40 MG tablet Take 40 mg by mouth daily.       . furosemide (LASIX) 40 MG tablet Take 1 tablet (40 mg total) by mouth daily. Monday, Wednesday and Friday  30 tablet  6  . levothyroxine (SYNTHROID, LEVOTHROID) 50 MCG tablet Take 50 mcg by mouth daily before breakfast.       . magnesium oxide (MAG-OX) 400 (241.3 MG) MG tablet Take 1 tablet (400 mg total) by mouth daily.  120 tablet  4  . olmesartan (BENICAR) 20 MG tablet Take 20 mg by mouth daily.       Marland Kitchen OVER THE COUNTER MEDICATION Place 1 drop into both eyes  daily as needed (red eyes). Over the counter eye drops for redness       No current facility-administered medications for this encounter.     Filed Vitals:   01/22/13 1433  BP: 126/98  Pulse: 96  Weight: 258 lb 1.9 oz (117.082 kg)  SpO2: 99%    PHYSICAL EXAM: General:  Well appearing. No resp difficulty HEENT: normal Neck: supple. JVP flat. Carotids 2+ bilaterally; no bruits. No lymphadenopathy or thryomegaly appreciated. R neck scar  Cor: PMI normal. Regular rate & rhythm. No rubs, gallops or murmurs.L upper chest scar ICD Lungs: clear Abdomen: obese, soft, nontender, nondistended. No hepatosplenomegaly. No bruits or masses. Good bowel sounds. Extremities: no cyanosis, clubbing, rash, edema Neuro: alert & orientedx3, cranial nerves grossly intact. Moves all 4 extremities w/o difficulty. Affect pleasant.   ASSESSMENT & PLAN:  1) Chronic systolic HF, NICM ECHO 11/2011 EF 15% (he had repeat ECHO at [facility], but have not received results). S/P Medtronic Optivol  ICD 04/2012 .  - NYHA II. Volume status stable. Optivol interrogated and no crossings over threshold. Will continue lasix to 40 mg M/W/Friday.  - Continue carvedilol to 18.75 mg bid, digoxin 0.125 mg daily, and benicar 20 mg daily. Will not titrate anything at this time he reports feeling fatigued and dizzy. Next visit consider changing benicar to losartan 25 mg daily.  - Reinforced the need and importance of daily weights, a low sodium diet, and fluid restriction (less than 2 L a day). Instructed to call the HF clinic if weight increases more than 3 lbs overnight or 5 lbs in a week.  - Last Mag level 1.3 will increase to 400 mg BID.   2) OSA -Continue CPAP nightly  3) Goiter Follow up encrinologist per Dr Clarene Duke and Dr Sharl Ma   Follow up in 3 weeks with ECHO   Ulla Potash BNP-C 2:47 PM

## 2013-01-22 NOTE — Patient Instructions (Signed)
Will schedule an ECHO before the end of the year.  Continue taking medications as prescribed.   Increase magnesium oxide to 400 mg twice a day.  Can take plain mucinex, plain robitussin and coricidin for cold. Stay away from decongestants.  Do the following things EVERYDAY: 1) Weigh yourself in the morning before breakfast. Write it down and keep it in a log. 2) Take your medicines as prescribed 3) Eat low salt foods-Limit salt (sodium) to 2000 mg per day.  4) Stay as active as you can everyday 5) Limit all fluids for the day to less than 2 liters 6)

## 2013-01-28 NOTE — Addendum Note (Signed)
Encounter addended by: Theresia Bough, CMA on: 01/28/2013 11:24 AM<BR>     Documentation filed: Visit Diagnoses, Orders

## 2013-01-29 ENCOUNTER — Other Ambulatory Visit (INDEPENDENT_AMBULATORY_CARE_PROVIDER_SITE_OTHER): Payer: 59

## 2013-01-29 ENCOUNTER — Encounter: Payer: Self-pay | Admitting: Internal Medicine

## 2013-01-29 DIAGNOSIS — I428 Other cardiomyopathies: Secondary | ICD-10-CM

## 2013-01-29 DIAGNOSIS — M109 Gout, unspecified: Secondary | ICD-10-CM

## 2013-01-29 LAB — BASIC METABOLIC PANEL
BUN: 19 mg/dL (ref 6–23)
CO2: 28 mEq/L (ref 19–32)
Calcium: 8.9 mg/dL (ref 8.4–10.5)
Chloride: 104 mEq/L (ref 96–112)
Creatinine, Ser: 1.5 mg/dL (ref 0.4–1.5)
Glucose, Bld: 103 mg/dL — ABNORMAL HIGH (ref 70–99)
Potassium: 4.1 mEq/L (ref 3.5–5.1)

## 2013-01-29 LAB — MAGNESIUM: Magnesium: 1.5 mg/dL (ref 1.5–2.5)

## 2013-02-07 ENCOUNTER — Encounter: Payer: Self-pay | Admitting: Internal Medicine

## 2013-02-10 ENCOUNTER — Other Ambulatory Visit (HOSPITAL_COMMUNITY): Payer: Self-pay | Admitting: Adult Health

## 2013-02-11 ENCOUNTER — Other Ambulatory Visit (HOSPITAL_COMMUNITY): Payer: Self-pay | Admitting: *Deleted

## 2013-02-11 DIAGNOSIS — I4891 Unspecified atrial fibrillation: Secondary | ICD-10-CM

## 2013-02-11 MED ORDER — DIGOXIN 250 MCG PO TABS: 0.1250 mg | ORAL_TABLET | Freq: Every day | ORAL | Status: DC

## 2013-02-17 ENCOUNTER — Other Ambulatory Visit (HOSPITAL_COMMUNITY): Payer: Self-pay | Admitting: Adult Health

## 2013-02-19 ENCOUNTER — Ambulatory Visit (HOSPITAL_COMMUNITY)
Admission: RE | Admit: 2013-02-19 | Discharge: 2013-02-19 | Disposition: A | Discharge status: Home / Self Care | Payer: 59 | Source: Ambulatory Visit | Attending: Internal Medicine | Admitting: Internal Medicine

## 2013-02-19 DIAGNOSIS — I5022 Chronic systolic (congestive) heart failure: Secondary | ICD-10-CM

## 2013-02-19 DIAGNOSIS — I428 Other cardiomyopathies: Secondary | ICD-10-CM

## 2013-02-19 DIAGNOSIS — Z95810 Presence of automatic (implantable) cardiac defibrillator: Secondary | ICD-10-CM

## 2013-02-19 NOTE — Progress Notes (Signed)
Heart Failure Diagnostics Follow Up Form - Medtronic    Fluid Index        ___   Fluid index trending with baseline       _X__   Fluid index trending up but below threshold--SLIGHTLY ELEVATED       ___   Fluid index above threshold       ___   More than 3 threshold crossings this year        Current diuretic dose: .   Lasix 40 mg daily                                                                                             Baseline weight: .                                      Current Weight: .                                     Patient Activity        Current activity level: .      2 hours daily                                                    Baseline activity level: .    2 hours daily                                                      AT/AF Burden        ___  Increase in AT/AF burden       ___  New onset of AT/AF       _X__  No AT/AF         ICD Therapies        ___ 1 or more ICD shocks    Interventions       _X__  Adjust medications: .Pt aware, he denies increase wt, SOB or edema, he will take extra 40 mg Lasix                                                                                                   ___  Labs: .  ___  Need for follow up appointment: .                                                                                _X__  HF diagnostics follow up: .    1 month                                                                                      ___  Review with EP: Marland Kitchen                                                                                                         Patient notified: Date: .   02/19/13                          By: .  Juliann Pares     Phone   .       Letter

## 2013-02-25 ENCOUNTER — Ambulatory Visit (HOSPITAL_COMMUNITY)
Admission: RE | Admit: 2013-02-25 | Discharge: 2013-02-25 | Disposition: A | Discharge status: Home / Self Care | Payer: 59 | Source: Ambulatory Visit | Attending: Cardiothoracic Surgery | Admitting: Cardiothoracic Surgery

## 2013-02-25 ENCOUNTER — Encounter (HOSPITAL_COMMUNITY): Payer: Self-pay

## 2013-02-25 ENCOUNTER — Ambulatory Visit (HOSPITAL_BASED_OUTPATIENT_CLINIC_OR_DEPARTMENT_OTHER)
Admission: RE | Admit: 2013-02-25 | Discharge: 2013-02-25 | Disposition: A | Discharge status: Home / Self Care | Payer: 59 | Source: Ambulatory Visit | Attending: Internal Medicine | Admitting: Internal Medicine

## 2013-02-25 VITALS — BP 124/98 | HR 86 | Wt 262.0 lb

## 2013-02-25 DIAGNOSIS — N189 Chronic kidney disease, unspecified: Secondary | ICD-10-CM | POA: Insufficient documentation

## 2013-02-25 DIAGNOSIS — I4729 Other ventricular tachycardia: Secondary | ICD-10-CM

## 2013-02-25 DIAGNOSIS — I059 Rheumatic mitral valve disease, unspecified: Secondary | ICD-10-CM

## 2013-02-25 DIAGNOSIS — R0989 Other specified symptoms and signs involving the circulatory and respiratory systems: Secondary | ICD-10-CM | POA: Insufficient documentation

## 2013-02-25 DIAGNOSIS — I509 Heart failure, unspecified: Secondary | ICD-10-CM | POA: Insufficient documentation

## 2013-02-25 DIAGNOSIS — R0609 Other forms of dyspnea: Secondary | ICD-10-CM | POA: Insufficient documentation

## 2013-02-25 DIAGNOSIS — R222 Localized swelling, mass and lump, trunk: Secondary | ICD-10-CM | POA: Insufficient documentation

## 2013-02-25 DIAGNOSIS — Z79899 Other long term (current) drug therapy: Secondary | ICD-10-CM | POA: Insufficient documentation

## 2013-02-25 DIAGNOSIS — C679 Malignant neoplasm of bladder, unspecified: Secondary | ICD-10-CM | POA: Insufficient documentation

## 2013-02-25 DIAGNOSIS — Z95810 Presence of automatic (implantable) cardiac defibrillator: Secondary | ICD-10-CM | POA: Insufficient documentation

## 2013-02-25 DIAGNOSIS — I129 Hypertensive chronic kidney disease with stage 1 through stage 4 chronic kidney disease, or unspecified chronic kidney disease: Secondary | ICD-10-CM | POA: Insufficient documentation

## 2013-02-25 DIAGNOSIS — I472 Ventricular tachycardia: Secondary | ICD-10-CM

## 2013-02-25 DIAGNOSIS — I5022 Chronic systolic (congestive) heart failure: Secondary | ICD-10-CM

## 2013-02-25 DIAGNOSIS — G4733 Obstructive sleep apnea (adult) (pediatric): Secondary | ICD-10-CM | POA: Insufficient documentation

## 2013-02-25 DIAGNOSIS — I5042 Chronic combined systolic (congestive) and diastolic (congestive) heart failure: Secondary | ICD-10-CM

## 2013-02-25 DIAGNOSIS — I428 Other cardiomyopathies: Secondary | ICD-10-CM | POA: Insufficient documentation

## 2013-02-25 DIAGNOSIS — M109 Gout, unspecified: Secondary | ICD-10-CM | POA: Insufficient documentation

## 2013-02-25 MED ORDER — CARVEDILOL 25 MG PO TABS: 25.0000 mg | ORAL_TABLET | Freq: Two times a day (BID) | ORAL | Status: DC

## 2013-02-25 NOTE — Progress Notes (Signed)
  Echocardiogram 2D Echocardiogram has been performed.  [name] 02/25/2013, 11:53 AM

## 2013-02-25 NOTE — Progress Notes (Signed)
Patient ID: [name], male   DOB: 1960/09/16, 52 y.o.   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  He is intoelrant to numerous HF meds including spiro (severe hyperkalemia - requiring HD), hydralazine (fuzzy-headed) and imdur (severe HAs). S/P 12/17/12 bladder cancer removal.   RHC 01/28/12  RA = 1  RV = 28/2/4  PA = 32/17 (22)  PCW = 6  Fick cardiac output/index = 5.1/2.2  Thermo cardiac output/index = 5.0/2.1  PVR = 4.0 Woods  FA sat = 98%  PA sat = 64%, 68%   11/29/11 ECHO EF 15% Diffuse hypokinesis. Features are consistent with a pseudonormal left ventricular filling pattern, with concomitant abnormal relaxation and increased filling pressure (grade 2 diastolic dysfunction).   Admitted to [facility] 01/26/2012 after his ICD fired 5 times Potassium >7.5 Required urgent dialysis. Discharged form [facility] 01/29/12. Discharge weight 250 pounds.   CPX test 12/13  Resting HR: 90 Peak HR: 150 % age predicted max HR: 89% BP rest: 121/88 BP peak: 189/112 Peak VO2: 15.2 % predicted peak VO2: 58.3% (When adjusted to the patient's ideal body weight of 176 lb (79.8 kg) the peak VO2 is 22 ml/kg (ibw)/min (63% of the ibw-adjusted predicted) VE/VCO2 slope: 26.4 OUES: 2.29 Peak RER: 1.05 Ventilatory Threshold: 12.3 % predicted peak VO2: 47.2%   ECHO 02/25/13 EF <15%   Labs  12/12/12 dig level 0.9 12/18/12 Magnesium 1.1 K 4.0 Creatinine 1.5 01/02/13: Uric acid 12.9, K+ 3.9, Cr 1.37, AST 21, ALT 19, Mag 1.3 01/29/13 K 4.1 Creatinine 1.5 Magnesium 1.5  He returns for follow up. Still undergoing treatment for bladder CA - he has 2 more weeks of BCG. Overall feeling better. Can do all ADLs without problem. Complains  of fatigue. Denies SOB/PND/Orthopnea.+ Bendopnea.   Denies dizziness. Weight at home 255 pounds. Compliant with medications. Not exercising. Working full time in IT.  Optivol- Activity ~2 hours well below threshold. No VT or AF.   ROS: All systems negative except as listed in HPI, PMH and Problem List.  Past Medical History  Diagnosis Date  . Gout 08/03/12    PT C/O OF RIGHT KNEE PAIN AND SWELLING -STATES GOUT FLARE UP - ONGOING SINCE APRIL - BUT SWELLING W/IN LAST WEEK  . Cardiomyopathy     Idiopathic dilated;   Marland Kitchen Ventricular tachycardia     s/p ICD  . Sleep apnea     CPAP  . Systolic CHF     EF 15%  . CKD (chronic kidney disease)     stage III baseline Crt 1.8-2.0  . Biventricular ICD (implantable cardiac defibrillator) Medtronic ]     DOI 2008/ upgrade 2010/ Gen Change 2014  . Automatic implantable cardioverter-defibrillator in situ   . CHF (congestive heart failure)   . Bladder tumor     PT HOSP AT [facility] 4/24 TO 4/26 2014 WITH UTI--AND FOUND TO HAVE BLADDER TUMOR-  . Arthritis     GOUT  . Hilar mass     Noted CT 2010  . HTN (hypertension)     Dr. Graciela Husbands cardiologist    Current Outpatient Prescriptions  Medication Sig Dispense Refill  . atorvastatin (LIPITOR) 40 MG tablet Take 40 mg by mouth at bedtime.       Marland Kitchen  carvedilol (COREG) 12.5 MG tablet Take 1.5 tablets (18.75 mg total) by mouth 2 (two) times daily with a meal.  90 tablet  3  . colchicine 0.6 MG tablet Take 0.6 mg by mouth daily as needed (gout).      Marland Kitchen digoxin (LANOXIN) 0.25 MG tablet Take 0.5 tablets (0.125 mg total) by mouth daily.  15 tablet  6  . febuxostat (ULORIC) 40 MG tablet Take 40 mg by mouth daily.       . furosemide (LASIX) 40 MG tablet Take 1 tablet (40 mg total) by mouth daily.  30 tablet  6  . levothyroxine (SYNTHROID, LEVOTHROID) 50 MCG tablet Take 50 mcg by mouth daily before breakfast.       . magnesium oxide (MAG-OX) 400 (241.3 MG) MG tablet Take 1 tablet (400 mg total) by mouth 2 (two) times  daily.  60 tablet  6  . olmesartan (BENICAR) 20 MG tablet Take 20 mg by mouth daily.       Marland Kitchen OVER THE COUNTER MEDICATION Place 1 drop into both eyes daily as needed (red eyes). Over the counter eye drops for redness       No current facility-administered medications for this encounter.     Filed Vitals:   02/25/13 1226  BP: 124/98  Pulse: 86  Weight: 262 lb (118.842 kg)  SpO2: 98%    PHYSICAL EXAM: General:  Well appearing. No resp difficulty HEENT: normal Neck: supple. JVP flat. Carotids 2+ bilaterally; no bruits. No lymphadenopathy or thryomegaly appreciated. R neck scar  Cor: PMI normal. Regular rate & rhythm. No rubs, gallops or murmurs.L upper chest scar ICD Lungs: clear Abdomen: obese, soft, nontender, nondistended. No hepatosplenomegaly. No bruits or masses. Good bowel sounds. Extremities: no cyanosis, clubbing, rash, edema Neuro: alert & orientedx3, cranial nerves grossly intact. Moves all 4 extremities w/o difficulty. Affect pleasant.   ASSESSMENT & PLAN:  1) Chronic systolic HF, NICM ECHO 02/25/13 EF 15%. Dr Gala Romney discussed and reviewed ECHO.  S/P Medtronic Optivol  ICD 04/2012 .  - NYHA II-III. Volume status stable. Optivol interrogated. Fluid well below threshold.  Will continue lasix to 40 mg M/W/Friday.  - Increase carvedilol to 25 mg twice a day, digoxin 0.125 mg daily, and benicar 20 mg daily.  May need to consider advanced therapies after he has completed cancer treatment.  - Reinforced the need and importance of daily weights, a low sodium diet, and fluid restriction (less than 2 L a day). Instructed to call the HF clinic if weight increases more than 3 lbs overnight or 5 lbs in a week.   2) OSA -Continue CPAP nightly  3) Bladder Cancer- on BCG treatment through December 2014  4) VT - quiescent. Followed by Dr. Graciela Husbands  Follow up in 1 month   CLEGG,AMY NP-C 1:16 PM  Patient seen and examined with Tonye Becket, NP. We discussed all aspects of the  encounter. I agree with the assessment and plan as stated above.   I have reviewed echo images and ICD interrogation personally in clinic. Symptomatically much better but EF remains severely depressed. Suspect he may need advanced therapies at some point. Volume status looks good so will continue current diuretic regimen. Agree with titrating other meds. Watch closely for recurrent hyperkalemia.   Daniel Bensimhon,MD 12:28 PM

## 2013-02-25 NOTE — Patient Instructions (Signed)
Follow up in 1 month   Take carvedilol 25 mg twice a day  Do the following things EVERYDAY: 1) Weigh yourself in the morning before breakfast. Write it down and keep it in a log. 2) Take your medicines as prescribed 3) Eat low salt foods-Limit salt (sodium) to 2000 mg per day.  4) Stay as active as you can everyday 5) Limit all fluids for the day to less than 2 liters

## 2013-03-01 ENCOUNTER — Other Ambulatory Visit (HOSPITAL_COMMUNITY): Payer: Self-pay | Admitting: Cardiology

## 2013-03-10 ENCOUNTER — Other Ambulatory Visit: Payer: Self-pay | Admitting: Pulmonary Disease

## 2013-03-10 DIAGNOSIS — G4733 Obstructive sleep apnea (adult) (pediatric): Secondary | ICD-10-CM

## 2013-03-26 ENCOUNTER — Ambulatory Visit (HOSPITAL_COMMUNITY)
Admission: RE | Admit: 2013-03-26 | Discharge: 2013-03-26 | Disposition: A | Discharge status: Home / Self Care | Payer: 59 | Source: Ambulatory Visit | Attending: Internal Medicine | Admitting: Internal Medicine

## 2013-03-26 DIAGNOSIS — I5042 Chronic combined systolic (congestive) and diastolic (congestive) heart failure: Secondary | ICD-10-CM

## 2013-03-26 DIAGNOSIS — Z95810 Presence of automatic (implantable) cardiac defibrillator: Secondary | ICD-10-CM

## 2013-03-26 DIAGNOSIS — I5022 Chronic systolic (congestive) heart failure: Secondary | ICD-10-CM

## 2013-03-28 ENCOUNTER — Encounter (HOSPITAL_COMMUNITY): Payer: Self-pay | Admitting: *Deleted

## 2013-03-28 NOTE — Progress Notes (Signed)
Heart Failure Diagnostics Follow Up Form - Medtronic    Fluid Index        ___   Fluid index trending with baseline       _X__   Fluid index trending up but below threshold--OPTIVOL SLIGHTLY ELEVATED, BUT IMPEDANCE IS TRENDING BACK TO BASELINE       ___   Fluid index above threshold       ___   More than 3 threshold crossings this year        Current diuretic dose: Marland Kitchen                                                                                                  Baseline weight: .                                      Current Weight: .                                     Patient Activity        Current activity level: .     2 HOURS DAILY                                                     Baseline activity level: .    1 & 1/2 HOURS DAILY                                                      AT/AF Burden        ___  Increase in AT/AF burden       ___  New onset of AT/AF       ___  No AT/AF         ICD Therapies        ___ 1 or more ICD shocks    Interventions       ___  Adjust medications: .                                                                                                   ___  Labs: .  ___  Need for follow up appointment: .                                                                                _x__  HF diagnostics follow up: .    1 MONTH                                                                                      ___  Review with EP: Marland Kitchen                                                                                                         Patient notified: Date: .     03/28/13                           By: .       Phone   .  X     Letter

## 2013-04-01 ENCOUNTER — Ambulatory Visit (HOSPITAL_COMMUNITY)
Admission: RE | Admit: 2013-04-01 | Discharge: 2013-04-01 | Disposition: A | Discharge status: Home / Self Care | Payer: 59 | Source: Ambulatory Visit | Attending: Internal Medicine | Admitting: Internal Medicine

## 2013-04-01 ENCOUNTER — Encounter (HOSPITAL_COMMUNITY): Payer: Self-pay

## 2013-04-01 ENCOUNTER — Encounter: Payer: Self-pay | Admitting: Internal Medicine

## 2013-04-01 VITALS — BP 124/76 | HR 88 | Resp 16 | Wt 261.2 lb

## 2013-04-01 DIAGNOSIS — R42 Dizziness and giddiness: Secondary | ICD-10-CM

## 2013-04-01 DIAGNOSIS — R0989 Other specified symptoms and signs involving the circulatory and respiratory systems: Secondary | ICD-10-CM

## 2013-04-01 DIAGNOSIS — I5022 Chronic systolic (congestive) heart failure: Secondary | ICD-10-CM | POA: Insufficient documentation

## 2013-04-01 DIAGNOSIS — R0609 Other forms of dyspnea: Secondary | ICD-10-CM

## 2013-04-01 NOTE — Progress Notes (Signed)
Patient ID: TAKESHI PITMAN, male   DOB: 1960-08-28, 53 y.o.   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  He is intoelrant to numerous HF meds including spiro (severe hyperkalemia - requiring HD), hydralazine (fuzzy-headed) and imdur (severe HAs). S/P 12/17/12 bladder cancer removal.   RHC 01/28/12  RA = 1  RV = 28/2/4  PA = 32/17 (22)  PCW = 6  Fick cardiac output/index = 5.1/2.2  Thermo cardiac output/index = 5.0/2.1  PVR = 4.0 Woods  FA sat = 98%  PA sat = 64%, 68%   11/29/11 ECHO EF 15% Diffuse hypokinesis. Features are consistent with a pseudonormal left ventricular filling pattern, with concomitant abnormal relaxation and increased filling pressure (grade 2 diastolic dysfunction).   Admitted to [facility] 01/26/2012 after his ICD fired 5 times Potassium >7.5 Required urgent dialysis. Discharged form [facility] 01/29/12. Discharge weight 250 pounds.   CPX test 12/13  Resting HR: 90 Peak HR: 150 % age predicted max HR: 89% BP rest: 121/88 BP peak: 189/112 Peak VO2: 15.2 % predicted peak VO2: 58.3% (When adjusted to the patient's ideal body weight of 176 lb (79.8 kg) the peak VO2 is 22 ml/kg (ibw)/min (63% of the ibw-adjusted predicted) VE/VCO2 slope: 26.4 OUES: 2.29 Peak RER: 1.05 Ventilatory Threshold: 12.3 % predicted peak VO2: 47.2%   ECHO 02/25/13 EF <15%   Labs  12/12/12 dig level 0.9 12/18/12 Magnesium 1.1 K 4.0 Creatinine 1.5 01/02/13: Uric acid 12.9, K+ 3.9, Cr 1.37, AST 21, ALT 19, Mag 1.3 01/29/13 K 4.1 Creatinine 1.5 Magnesium 1.5 03/21/13 Magnesium 1.7 TSH 1.11--> synthorid increased to 100 mcg.   He returns for follow up. Last visit carvedilol was increased to 25 mg  twice a day. Complains of fatigue but synthroid being adjusted by Dr Buddy Duty. Mild dyspnea with exertion. Denies PND/Orthopnea. He has completed BCG treatment and has follow up with Dr Karsten Ro next month. Weight at home 255 pounds. Compliant with meds. Using CPAP nightly. Working full time in IT.   Optivol- Activity ~2 hours well below threshold. ? Afib .   ROS: All systems negative except as listed in HPI, PMH and Problem List.  Past Medical History  Diagnosis Date  . Gout 08/03/12    PT C/O OF RIGHT KNEE PAIN AND SWELLING -STATES GOUT FLARE UP - ONGOING SINCE APRIL - BUT SWELLING W/IN LAST WEEK  . Cardiomyopathy     Idiopathic dilated;   Marland Kitchen Ventricular tachycardia     s/p ICD  . Sleep apnea     CPAP  . Systolic CHF     EF 0000000  . CKD (chronic kidney disease)     stage III baseline Crt 1.8-2.0  . Biventricular ICD (implantable cardiac defibrillator) Medtronic ]     DOI 2008/ upgrade 2010/ Gen Change 2014  . Automatic implantable cardioverter-defibrillator in situ   . CHF (congestive heart failure)   . Bladder tumor     PT HOSP AT [facility] 4/24 TO 4/26 2014 WITH UTI--AND FOUND TO HAVE BLADDER TUMOR-  . Arthritis     GOUT  . Hilar mass     Noted CT 2010  . HTN (hypertension)     Dr. Caryl Comes cardiologist  Current Outpatient Prescriptions  Medication Sig Dispense Refill  . atorvastatin (LIPITOR) 40 MG tablet Take 40 mg by mouth at bedtime.       Marland Kitchen BENICAR 20 MG tablet TAKE 1 TABLET BY MOUTH TWICE A DAY  30 tablet  2  . carvedilol (COREG) 25 MG tablet Take 1 tablet (25 mg total) by mouth 2 (two) times daily with a meal.  60 tablet  6  . colchicine 0.6 MG tablet Take 0.6 mg by mouth daily as needed (gout).      Marland Kitchen digoxin (LANOXIN) 0.25 MG tablet Take 0.5 tablets (0.125 mg total) by mouth daily.  15 tablet  6  . febuxostat (ULORIC) 40 MG tablet Take 40 mg by mouth daily.       . furosemide (LASIX) 40 MG tablet Take 1 tablet (40 mg total) by mouth daily.  30 tablet  6  . levothyroxine  (SYNTHROID, LEVOTHROID) 50 MCG tablet Take 100 mcg by mouth daily before breakfast.       . magnesium oxide (MAG-OX) 400 (241.3 MG) MG tablet Take 1 tablet (400 mg total) by mouth 2 (two) times daily.  60 tablet  6  . OVER THE COUNTER MEDICATION Place 1 drop into both eyes daily as needed (red eyes). Over the counter eye drops for redness       No current facility-administered medications for this encounter.     Filed Vitals:   04/01/13 1113  BP: 124/76  Pulse: 88  Resp: 16  Weight: 261 lb 4 oz (118.502 kg)  SpO2: 98%    PHYSICAL EXAM: General:  Well appearing. No resp difficulty HEENT: normal Neck: supple. JVP flat. Carotids 2+ bilaterally; no bruits. No lymphadenopathy or thryomegaly appreciated. R neck scar  Cor: PMI normal. Regular rate & rhythm. No rubs, gallops or murmurs.L upper chest scar ICD Lungs: clear Abdomen: obese, soft, nontender, nondistended. No hepatosplenomegaly. No bruits or masses. Good bowel sounds. Extremities: no cyanosis, clubbing, rash, edema Neuro: alert & orientedx3, cranial nerves grossly intact. Moves all 4 extremities w/o difficulty. Affect pleasant.   ASSESSMENT & PLAN:  1) Chronic systolic HF, NICM ECHO 57/84/69 EF 15%. Dr Haroldine Laws discussed and reviewed ECHO.  S/P Medtronic Optivol  ICD 04/2012 .  - NYHA II-III. Volume status stable. Optivol interrogated. Fluid below threshold and activity ~1 hour. No A fib noted and verified Medtronic Representative.  Continue lasix at least 5 days a week.   -Continue carvedilol to 25 mg twice a day, digoxin 0.125 mg daily, and benicar 20 mg daily.  -He is intoelrant to numerous HF meds including spiro (severe hyperkalemia - requiring HD), hydralazine (fuzzy-headed) and imdur (severe HAs). May need to consider advanced therapies after he has completed bladder cancer treatment.  - Reinforced the need and importance of daily weights, a low sodium diet, and fluid restriction (less than 2 L a day). Instructed to call  the HF clinic if weight increases more than 3 lbs overnight or 5 lbs in a week.   2) OSA -Continue CPAP nightly  3) Bladder Cancer- Completed BCG December 2014. F/U with Dr Karsten Ro next month .   4) VT - quiescent. Followed by Dr. Caryl Comes  Follow up 6 weeks  CLEGG,AMY NP-C 11:26 AM  Patient seen and examined with Darrick Grinder, NP. We discussed all aspects of the encounter. I agree with the assessment and plan as stated above. I reviewed echo and interrogated ICD/optivol personally.  Clinically doing well but EF remains very depressed. Volume status looks  good on exam and Optivol. No AF or VT on ICD. Suspect we will need to consider advanced therapies in the future after treatment for bladder CA. Reinforced need for daily weights and reviewed use of sliding scale diuretics.   Dicky Boer,MD 5:24 PM

## 2013-04-01 NOTE — Patient Instructions (Addendum)
Follow up 6 weeks.  Do the following things EVERYDAY: 1) Weigh yourself in the morning before breakfast. Write it down and keep it in a log. 2) Take your medicines as prescribed 3) Eat low salt foods-Limit salt (sodium) to 2000 mg per day.  4) Stay as active as you can everyday 5) Limit all fluids for the day to less than 2 liters  

## 2013-04-02 ENCOUNTER — Encounter: Payer: Self-pay | Admitting: Internal Medicine

## 2013-04-03 ENCOUNTER — Ambulatory Visit (INDEPENDENT_AMBULATORY_CARE_PROVIDER_SITE_OTHER): Payer: 59 | Admitting: *Deleted

## 2013-04-03 DIAGNOSIS — I428 Other cardiomyopathies: Secondary | ICD-10-CM

## 2013-04-03 DIAGNOSIS — I5022 Chronic systolic (congestive) heart failure: Secondary | ICD-10-CM

## 2013-04-03 DIAGNOSIS — I472 Ventricular tachycardia: Secondary | ICD-10-CM

## 2013-04-03 DIAGNOSIS — I4729 Other ventricular tachycardia: Secondary | ICD-10-CM

## 2013-04-03 LAB — MDC_IDC_ENUM_SESS_TYPE_REMOTE
Battery Voltage: 2.99 V
Brady Statistic AP VP Percent: 0.02 %
Brady Statistic AP VS Percent: 0.01 %
Brady Statistic AS VP Percent: 99.74 %
HIGH POWER IMPEDANCE MEASURED VALUE: 61 Ohm
HighPow Impedance: 190 Ohm
HighPow Impedance: 48 Ohm
Lead Channel Impedance Value: 285 Ohm
Lead Channel Impedance Value: 456 Ohm
Lead Channel Impedance Value: 456 Ohm
Lead Channel Impedance Value: 608 Ohm
Lead Channel Pacing Threshold Amplitude: 0.625 V
Lead Channel Pacing Threshold Amplitude: 0.75 V
Lead Channel Pacing Threshold Pulse Width: 0.4 ms
Lead Channel Pacing Threshold Pulse Width: 0.4 ms
Lead Channel Pacing Threshold Pulse Width: 0.4 ms
Lead Channel Sensing Intrinsic Amplitude: 20 mV
Lead Channel Setting Pacing Amplitude: 2 V
Lead Channel Setting Pacing Amplitude: 2.5 V
Lead Channel Setting Pacing Pulse Width: 0.4 ms
Lead Channel Setting Pacing Pulse Width: 0.4 ms
Lead Channel Setting Sensing Sensitivity: 0.6 mV
MDC IDC MSMT BATTERY REMAINING LONGEVITY: 86 mo
MDC IDC MSMT LEADCHNL LV IMPEDANCE VALUE: 456 Ohm
MDC IDC MSMT LEADCHNL RA PACING THRESHOLD AMPLITUDE: 0.5 V
MDC IDC MSMT LEADCHNL RA SENSING INTR AMPL: 2.25 mV
MDC IDC SESS DTM: 20150128080053
MDC IDC SET LEADCHNL LV PACING AMPLITUDE: 2.25 V
MDC IDC SET ZONE DETECTION INTERVAL: 250 ms
MDC IDC STAT BRADY AS VS PERCENT: 0.23 %
MDC IDC STAT BRADY RA PERCENT PACED: 0.03 %
MDC IDC STAT BRADY RV PERCENT PACED: 99.62 %
Zone Setting Detection Interval: 300 ms
Zone Setting Detection Interval: 400 ms
Zone Setting Detection Interval: 410 ms
Zone Setting Detection Interval: 450 ms

## 2013-04-10 ENCOUNTER — Ambulatory Visit: Payer: 59 | Admitting: Cardiothoracic Surgery

## 2013-04-10 ENCOUNTER — Ambulatory Visit (INDEPENDENT_AMBULATORY_CARE_PROVIDER_SITE_OTHER): Payer: 59 | Admitting: Cardiothoracic Surgery

## 2013-04-10 ENCOUNTER — Encounter: Payer: Self-pay | Admitting: Cardiothoracic Surgery

## 2013-04-10 VITALS — BP 109/75 | HR 92 | Resp 20 | Ht 71.0 in | Wt 261.0 lb

## 2013-04-10 DIAGNOSIS — E049 Nontoxic goiter, unspecified: Secondary | ICD-10-CM

## 2013-04-10 NOTE — Progress Notes (Signed)
PCP is Gennette Pac, MD Referring Provider is Hulan Fess, MD  Chief Complaint  Patient presents with  . 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 Subsequent thyroid scan showed faint activity in the mediastinum. Patient is now being treated by endocrine with Synthroid. Fortunately the goiter is not symptomatic. Patient has severe advanced heart failure with EF of 15% secondary to idiopathic cardiomyopathy. He still quite functional works full-time.   Past Medical History  Diagnosis Date  . Gout 08/03/12    PT C/O OF RIGHT KNEE PAIN AND SWELLING -STATES GOUT FLARE UP - ONGOING SINCE APRIL - BUT SWELLING W/IN LAST WEEK  . Cardiomyopathy     Idiopathic dilated;   Marland Kitchen Ventricular tachycardia     s/p ICD  . Sleep apnea     CPAP  . Systolic CHF     EF 10%  . CKD (chronic kidney disease)     stage III baseline Crt 1.8-2.0  . Biventricular ICD (implantable cardiac defibrillator) Medtronic ]     DOI 2008/ upgrade 2010/ Gen Change 2014  . Automatic implantable cardioverter-defibrillator in situ   . CHF (congestive heart failure)   . Bladder tumor     PT HOSP AT [facility] 4/24 TO 4/26 2014 WITH UTI--AND FOUND TO HAVE BLADDER TUMOR-  . Arthritis     GOUT  . Hilar mass     Noted CT 2010  . HTN (hypertension)     Dr. Caryl Comes cardiologist    Past Surgical History  Procedure Laterality Date  . Cholecystectomy    . Cardiac catheterization      he was found to have normal coronary arteries but with a globally dilated and hypocontractile heart  . US echocardiography  03-17-2008, 07-03-2006    EF 15-20%, EF 15-20%  . Transurethral resection of bladder tumor N/A 08/13/2012    Procedure: TRANSURETHRAL RESECTION OF BLADDER TUMOR (TURBT);  Surgeon: Claybon Jabs, MD;  Location: [facility];  Service: Urology;  Laterality: N/A;  MITOMYCIN C  . Icd insertion   04/2012-new ICD  . Endobronchial ultrasound Bilateral 10/01/2012    Procedure: ENDOBRONCHIAL ULTRASOUND;  Surgeon: Collene Gobble, MD;  Location: [facility] ENDOSCOPY;  Service: Cardiopulmonary;  Laterality: Bilateral;  . Cornea replacement    . Video bronchoscopy N/A 10/25/2012    Procedure: VIDEO BRONCHOSCOPY;  Surgeon: [name], MD;  Location: [place];  Service: Thoracic;  Laterality: N/A;  . Mediastinoscopy N/A 10/25/2012    Procedure: MEDIASTINOSCOPY;  Surgeon: [name], MD;  Location: [place];  Service: Thoracic;  Laterality: N/A;    No family history on file.  Social History History  Substance Use Topics  . Smoking status: Never Smoker   . Smokeless tobacco: Never Used  . Alcohol Use: No    Current Outpatient Prescriptions  Medication Sig Dispense Refill  . atorvastatin (LIPITOR) 40 MG tablet Take 40 mg by mouth at bedtime.       Marland Kitchen BENICAR 20 MG tablet TAKE 1 TABLET BY MOUTH TWICE A DAY  30 tablet  2  . carvedilol (COREG) 25 MG tablet Take 1 tablet (25 mg total) by mouth 2 (two) times daily with a meal.  60 tablet  6  . colchicine 0.6 MG tablet Take 0.6 mg by mouth daily as needed (gout).      Marland Kitchen digoxin (LANOXIN) 0.25 MG tablet Take  0.5 tablets (0.125 mg total) by mouth daily.  15 tablet  6  . febuxostat (ULORIC) 40 MG tablet Take 40 mg by mouth daily.       . furosemide (LASIX) 40 MG tablet Take 1 tablet (40 mg total) by mouth daily.  30 tablet  6  . levothyroxine (SYNTHROID, LEVOTHROID) 50 MCG tablet Take 100 mcg by mouth daily before breakfast.       . magnesium oxide (MAG-OX) 400 (241.3 MG) MG tablet Take 1 tablet (400 mg total) by mouth 2 (two) times daily.  60 tablet  6  . OVER THE COUNTER MEDICATION Place 1 drop into both eyes daily as needed (red eyes). Over the counter eye drops for redness       No current facility-administered medications for this visit.    No Known Allergies  Review of Systems recently treated for submucosal bladder cancer with surgery now  receiving BCG irrigation therapy Clinical symptoms of heart failure fairly stable Patient currently recovering from a URI  BP 109/75  Pulse 92  Resp 20  Ht 5\' 11"  (1.803 m)  Wt 261 lb (118.389 kg)  BMI 36.42 kg/m2  SpO2 98% Physical Exam Alert and comfortable Breath sounds clear No palpable neck mass Cardiac rhythm regular  Diagnostic Tests: None  Impression: Substernal goiter being treated medically Advanced heart failure being treated to the heart failure clinic Patient to return here as needed  Plan: Return when necessary

## 2013-04-16 ENCOUNTER — Encounter: Payer: Self-pay | Admitting: *Deleted

## 2013-04-16 ENCOUNTER — Telehealth (HOSPITAL_COMMUNITY): Payer: Self-pay | Admitting: Anesthesiology

## 2013-04-16 MED ORDER — FUROSEMIDE 40 MG PO TABS: 40.0000 mg | ORAL_TABLET | ORAL | Status: DC

## 2013-04-16 NOTE — Telephone Encounter (Signed)
Received labs from PCP:  Cr slightly elevated from 1.5 to 1.68 and 3 months ago was 1.35. At last OV 04/01/13 Optivol interrogated and no crossings over threshold. Patient reports not urinating much with lasix. Will have hold for 2 days and then start taking 40 mg QOD. Instructed to call if weight starting to increase. New rx sent to the pharmacy.  Junie Bame B NP-C 3:58 PM

## 2013-04-25 ENCOUNTER — Encounter: Payer: Self-pay | Admitting: Internal Medicine

## 2013-04-29 ENCOUNTER — Encounter: Payer: Self-pay | Admitting: Internal Medicine

## 2013-04-30 ENCOUNTER — Ambulatory Visit (HOSPITAL_COMMUNITY)
Admission: RE | Admit: 2013-04-30 | Discharge: 2013-04-30 | Disposition: A | Discharge status: Home / Self Care | Payer: 59 | Source: Ambulatory Visit | Attending: Internal Medicine | Admitting: Internal Medicine

## 2013-04-30 DIAGNOSIS — I5022 Chronic systolic (congestive) heart failure: Secondary | ICD-10-CM

## 2013-05-01 NOTE — Progress Notes (Signed)
Heart Failure Diagnostics Follow Up Form - Medtronic    Fluid Index        ___   Fluid index trending with baseline       _X__   Fluid index trending up but below threshold       ___   Fluid index above threshold       ___   More than 3 threshold crossings this year        Current diuretic dose: . Lasix 40mg  QOD, however pt states he has not been taking lately                                                                                                 Baseline weight: .                                      Current Weight: .                                     Patient Activity        Current activity level: .       1 & 1/2 hours daily                                                  Baseline activity level: .     1-2 hours daily                                                    AT/AF Burden        ___  Increase in AT/AF burden       ___  New onset of AT/AF       _X__  No AT/AF         ICD Therapies        ___ 1 or more ICD shocks    Interventions       _X__  Adjust medications: . Pt will restart Lasix                                                                                                  ___  Labs: .  ___  Need for follow up appointment: .                                                                                ___  HF diagnostics follow up: .                                                                                          ___  Review with EP: Marland Kitchen                                                                                                         Patient notified: Date: .   05/01/13                             By: .  Rhunette Croft     Phone   .       Letter

## 2013-05-05 ENCOUNTER — Ambulatory Visit (INDEPENDENT_AMBULATORY_CARE_PROVIDER_SITE_OTHER): Payer: 59 | Admitting: Emergency Medicine

## 2013-05-05 VITALS — BP 110/84 | HR 86 | Temp 98.3°F | Resp 18 | Ht 71.0 in | Wt 255.0 lb

## 2013-05-05 DIAGNOSIS — J209 Acute bronchitis, unspecified: Secondary | ICD-10-CM

## 2013-05-05 DIAGNOSIS — J018 Other acute sinusitis: Secondary | ICD-10-CM

## 2013-05-05 MED ORDER — PROMETHAZINE-CODEINE 6.25-10 MG/5ML PO SYRP: 5.0000 mL | ORAL_SOLUTION | Freq: Four times a day (QID) | ORAL | Status: DC | PRN

## 2013-05-05 MED ORDER — AMOXICILLIN-POT CLAVULANATE 875-125 MG PO TABS: 1.0000 | ORAL_TABLET | Freq: Two times a day (BID) | ORAL | Status: DC

## 2013-05-05 MED ORDER — PSEUDOEPHEDRINE-GUAIFENESIN ER 60-600 MG PO TB12: 1.0000 | ORAL_TABLET | Freq: Two times a day (BID) | ORAL | Status: DC

## 2013-05-05 NOTE — Progress Notes (Signed)
[facility] [address], [place] [address] [id number]  Date:  05/05/2013   Name:  [name]   DOB:  1960/06/04   MRN:  [id number]  PCP:  Gennette Pac, MD    Chief Complaint: Sinusitis   History of Present Illness:  [name] is a 53 y.o. very pleasant male patient who presents with the following:  Ill for a month with nasal congestion, drainage of a purulent nature.  Has cough no wheezing or shortness of breath.  No fever or chills.  No nausea or vomiting. No stool change.  Has a headache.  No improvement with over the counter medications or other home remedies. Denies other complaint or health concern today.   Patient Active Problem List   Diagnosis Date Noted  . Bladder cancer 11/09/2012  . Mediastinal mass 09/28/2012  . Preoperative cardiovascular examination 09/11/2012  . Hilar mass   . Malignant tumor of urinary bladder 07/13/2012  . Chronic combined systolic and diastolic CHF (congestive heart failure) 06/28/2012  . Gout 05/17/2012  . Inappropriate shocks from ICD -secondary T wave oversensing in the context of hyperkalemia 04/12/2012  . Biventricular ICD-Medtronic 05/17/2011  . 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 ORTHOSTATIC DIZZINESS 11/03/2009  . VENTRICULAR TACHYCARDIA 10/20/2008  . SYSTOLIC HEART FAILURE, CHRONIC 10/20/2008  . DYSPNEA 03/25/2008  . ABNORMAL LUNG XRAY 03/25/2008  . OBSTRUCTIVE SLEEP APNEA 03/12/2007  . Essential hypertension, benign 03/12/2007  . CARDIOMYOPATHY 03/12/2007    Past Medical History  Diagnosis Date  . Gout 08/03/12    PT C/O OF RIGHT KNEE PAIN AND SWELLING -STATES GOUT FLARE UP - ONGOING SINCE APRIL - BUT SWELLING W/IN LAST WEEK  . Cardiomyopathy     Idiopathic dilated;   Marland Kitchen Ventricular tachycardia     s/p ICD  . Sleep apnea     CPAP  . Systolic CHF     EF 61%  . CKD (chronic kidney disease)     stage III baseline Crt 1.8-2.0  . Biventricular ICD (implantable  cardiac defibrillator) Medtronic ]     DOI 2008/ upgrade 2010/ Gen Change 2014  . Automatic implantable cardioverter-defibrillator in situ   . CHF (congestive heart failure)   . Bladder tumor     PT HOSP AT [facility] 4/24 TO 4/26 2014 WITH UTI--AND FOUND TO HAVE BLADDER TUMOR-  . Arthritis     GOUT  . Hilar mass     Noted CT 2010  . HTN (hypertension)     Dr. Caryl Comes cardiologist    Past Surgical History  Procedure Laterality Date  . Cholecystectomy    . Cardiac catheterization      he was found to have normal coronary arteries but with a globally dilated and hypocontractile heart  . US echocardiography  03-17-2008, 07-03-2006    EF 15-20%, EF 15-20%  . Transurethral resection of bladder tumor N/A 08/13/2012    Procedure: TRANSURETHRAL RESECTION OF BLADDER TUMOR (TURBT);  Surgeon: Claybon Jabs, MD;  Location: [facility];  Service: Urology;  Laterality: N/A;  MITOMYCIN C  . Icd insertion  04/2012-new ICD  . Endobronchial ultrasound Bilateral 10/01/2012    Procedure: ENDOBRONCHIAL ULTRASOUND;  Surgeon: Collene Gobble, MD;  Location: [facility];  Service: Cardiopulmonary;  Laterality: Bilateral;  . Cornea replacement    . Video bronchoscopy N/A 10/25/2012    Procedure: VIDEO BRONCHOSCOPY;  Surgeon: [name], MD;  Location: [place];  Service: Thoracic;  Laterality: N/A;  .  Mediastinoscopy N/A 10/25/2012    Procedure: MEDIASTINOSCOPY;  Surgeon: [name], MD;  Location: [place];  Service: Thoracic;  Laterality: N/A;    History  Substance Use Topics  . Smoking status: Never Smoker   . Smokeless tobacco: Never Used  . Alcohol Use: No    No family history on file.  No Known Allergies  Medication list has been reviewed and updated.  Current Outpatient Prescriptions on File Prior to Visit  Medication Sig Dispense Refill  . atorvastatin (LIPITOR) 40 MG tablet Take 40 mg by mouth at bedtime.       Marland Kitchen BENICAR 20 MG tablet TAKE 1 TABLET BY MOUTH TWICE A DAY  30 tablet  2  . carvedilol  (COREG) 25 MG tablet Take 1 tablet (25 mg total) by mouth 2 (two) times daily with a meal.  60 tablet  6  . colchicine 0.6 MG tablet Take 0.6 mg by mouth daily as needed (gout).      Marland Kitchen digoxin (LANOXIN) 0.25 MG tablet Take 0.5 tablets (0.125 mg total) by mouth daily.  15 tablet  6  . febuxostat (ULORIC) 40 MG tablet Take 40 mg by mouth daily.       . furosemide (LASIX) 40 MG tablet Take 1 tablet (40 mg total) by mouth every other day.  17 tablet  6  . levothyroxine (SYNTHROID, LEVOTHROID) 50 MCG tablet Take 100 mcg by mouth daily before breakfast.       . magnesium oxide (MAG-OX) 400 (241.3 MG) MG tablet Take 1 tablet (400 mg total) by mouth 2 (two) times daily.  60 tablet  6  . OVER THE COUNTER MEDICATION Place 1 drop into both eyes daily as needed (red eyes). Over the counter eye drops for redness       No current facility-administered medications on file prior to visit.    Review of Systems:  As per HPI, otherwise negative.    Physical Examination: Filed Vitals:   05/05/13 1235  BP: 110/84  Pulse: 86  Temp: 98.3 F (36.8 C)  Resp: 18   Filed Vitals:   05/05/13 1235  Height: 5\' 11"  (1.803 m)  Weight: 255 lb (115.667 kg)   Body mass index is 35.58 kg/(m^2). Ideal Body Weight: Weight in (lb) to have BMI = 25: 178.9  GEN: WDWN, NAD, Non-toxic, A & O x 3 HEENT: Atraumatic, Normocephalic. Neck supple. No masses, No LAD. Ears and Nose: No external deformity. CV: RRR, No M/G/R. No JVD. No thrill. No extra heart sounds. PULM: CTA B, no wheezes, crackles, rhonchi. No retractions. No resp. distress. No accessory muscle use. ABD: S, NT, ND, +BS. No rebound. No HSM. EXTR: No c/c/e NEURO Normal gait.  PSYCH: Normally interactive. Conversant. Not depressed or anxious appearing.  Calm demeanor.    Assessment and Plan: Sinusitis Bronchitis augmentin mucinex d Phen c cod   Signed,  Ellison Carwin, MD

## 2013-05-05 NOTE — Patient Instructions (Signed)

## 2013-05-09 ENCOUNTER — Encounter: Payer: Self-pay | Admitting: Internal Medicine

## 2013-05-13 ENCOUNTER — Encounter (HOSPITAL_COMMUNITY): Payer: 59

## 2013-05-16 ENCOUNTER — Encounter: Payer: Self-pay | Admitting: Internal Medicine

## 2013-05-28 ENCOUNTER — Ambulatory Visit (HOSPITAL_COMMUNITY)
Admission: RE | Admit: 2013-05-28 | Discharge: 2013-05-28 | Disposition: A | Discharge status: Home / Self Care | Payer: 59 | Source: Ambulatory Visit | Attending: Cardiology | Admitting: Cardiology

## 2013-05-28 DIAGNOSIS — I5042 Chronic combined systolic (congestive) and diastolic (congestive) heart failure: Secondary | ICD-10-CM

## 2013-05-28 DIAGNOSIS — I5022 Chronic systolic (congestive) heart failure: Secondary | ICD-10-CM

## 2013-05-28 NOTE — Progress Notes (Addendum)
Heart Failure Diagnostics Follow Up Form - Medtronic    Fluid Index        ___   Fluid index trending with baseline       _X__   Fluid index trending up but below threshold--ONLY SLIGHTLY       ___   Fluid index above threshold       ___   More than 3 threshold crossings this year        Current diuretic dose: .   Lasix 40 mg QOD, pt states he has been taking                                                                                               Baseline weight: .                                      Current Weight: .                                     Patient Activity        Current activity level: .     1 & 1/2 hours daily                                                     Baseline activity level: .     1 & 1/2 hours daily                                                     AT/AF Burden        ___  Increase in AT/AF burden       ___  New onset of AT/AF       _X__  No AT/AF         ICD Therapies        ___ 1 or more ICD shocks    Interventions       ___  Adjust medications: .Only slightly elevated, pt denies SOB or edema and states wt is down, will monitor and call back if develops symptoms or wt gain, will not adjust meds at this time since he is asymptomatic and concerned about kidney function  _X__  Labs: Marland Kitchen    At [facility] 06/12/13                                                                                                                      _X__  Need for follow up appointment: .   Pt missed last appt with [place], appt resch to 06/12/13 will check bmet that day                                                                              _X__  HF diagnostics follow up: . 07/01/13                                                                                         ___  Review with EP: Marland Kitchen                                                                                                          Patient notified: Date: . 05/28/13                             By: .  Rhunette Croft     Phone   .       Letter

## 2013-06-06 ENCOUNTER — Other Ambulatory Visit (HOSPITAL_COMMUNITY): Payer: Self-pay | Admitting: Internal Medicine

## 2013-06-06 NOTE — Telephone Encounter (Signed)
Kim, can you look at this? I am confused if this should be qd or bid. Thanks, MI

## 2013-06-09 ENCOUNTER — Other Ambulatory Visit: Payer: Self-pay | Admitting: Emergency Medicine

## 2013-06-12 ENCOUNTER — Encounter (HOSPITAL_COMMUNITY): Payer: Self-pay

## 2013-06-12 ENCOUNTER — Telehealth (HOSPITAL_COMMUNITY): Payer: Self-pay | Admitting: Anesthesiology

## 2013-06-12 ENCOUNTER — Ambulatory Visit (HOSPITAL_COMMUNITY)
Admission: RE | Admit: 2013-06-12 | Discharge: 2013-06-12 | Disposition: A | Discharge status: Home / Self Care | Payer: 59 | Source: Ambulatory Visit | Attending: Internal Medicine | Admitting: Internal Medicine

## 2013-06-12 VITALS — BP 116/84 | HR 86 | Resp 18 | Wt 251.0 lb

## 2013-06-12 DIAGNOSIS — G4733 Obstructive sleep apnea (adult) (pediatric): Secondary | ICD-10-CM | POA: Insufficient documentation

## 2013-06-12 DIAGNOSIS — I509 Heart failure, unspecified: Secondary | ICD-10-CM | POA: Insufficient documentation

## 2013-06-12 DIAGNOSIS — Z8551 Personal history of malignant neoplasm of bladder: Secondary | ICD-10-CM | POA: Insufficient documentation

## 2013-06-12 DIAGNOSIS — I472 Ventricular tachycardia, unspecified: Secondary | ICD-10-CM | POA: Insufficient documentation

## 2013-06-12 DIAGNOSIS — C679 Malignant neoplasm of bladder, unspecified: Secondary | ICD-10-CM

## 2013-06-12 DIAGNOSIS — I4729 Other ventricular tachycardia: Secondary | ICD-10-CM | POA: Insufficient documentation

## 2013-06-12 DIAGNOSIS — M109 Gout, unspecified: Secondary | ICD-10-CM | POA: Insufficient documentation

## 2013-06-12 DIAGNOSIS — I5042 Chronic combined systolic (congestive) and diastolic (congestive) heart failure: Secondary | ICD-10-CM | POA: Insufficient documentation

## 2013-06-12 DIAGNOSIS — I5022 Chronic systolic (congestive) heart failure: Secondary | ICD-10-CM

## 2013-06-12 LAB — BASIC METABOLIC PANEL
BUN: 26 mg/dL — ABNORMAL HIGH (ref 6–23)
CHLORIDE: 100 meq/L (ref 96–112)
CO2: 21 meq/L (ref 19–32)
Calcium: 8.9 mg/dL (ref 8.4–10.5)
Creatinine, Ser: 1.92 mg/dL — ABNORMAL HIGH (ref 0.50–1.35)
GFR calc non Af Amer: 38 mL/min — ABNORMAL LOW (ref >90)
GFR, EST AFRICAN AMERICAN: 44 mL/min — AB (ref >90)
Glucose, Bld: 105 mg/dL — ABNORMAL HIGH (ref 70–99)
POTASSIUM: 4.3 meq/L (ref 3.7–5.3)
SODIUM: 137 meq/L (ref 137–147)

## 2013-06-12 LAB — URIC ACID: URIC ACID, SERUM: 12.4 mg/dL — AB (ref 4.0–7.8)

## 2013-06-12 LAB — DIGOXIN LEVEL: DIGOXIN LVL: 0.3 ng/mL — AB (ref 0.8–2.0)

## 2013-06-12 LAB — MAGNESIUM: MAGNESIUM: 1.4 mg/dL — AB (ref 1.5–2.5)

## 2013-06-12 NOTE — Telephone Encounter (Signed)
Cr elevated to 1.9. Instructed to hold lasix for 2 days and then go back to 40 mg QOD. Mag low still already on 400 mg PO BID. Uric acid elevated. Informed patient. Will see patient back in 2 weeks for CPX and repeat BMET.  Rande Brunt NP-C 5:05 PM

## 2013-06-12 NOTE — Patient Instructions (Addendum)
Doing well.  Call any issues.  F/U 3 months  Will order CPX test.  Do the following things EVERYDAY: 1) Weigh yourself in the morning before breakfast. Write it down and keep it in a log. 2) Take your medicines as prescribed 3) Eat low salt foods-Limit salt (sodium) to 2000 mg per day.  4) Stay as active as you can everyday 5) Limit all fluids for the day to less than 2 liters

## 2013-06-12 NOTE — Progress Notes (Signed)
Patient ID: [name], male   DOB: 08/05/1960, 53 y.o.   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  He is intoelrant to numerous HF meds including spiro (severe hyperkalemia - requiring HD), hydralazine (fuzzy-headed) and imdur (severe HAs). S/P 12/17/12 bladder cancer removal.   RHC 01/28/12  RA = 1  RV = 28/2/4  PA = 32/17 (22)  PCW = 6  Fick cardiac output/index = 5.1/2.2  Thermo cardiac output/index = 5.0/2.1  PVR = 4.0 [place]  FA sat = 98%  PA sat = 64%, 68%   11/29/11 ECHO EF 15% Diffuse hypokinesis. Features are consistent with a pseudonormal left ventricular filling pattern, with concomitant abnormal relaxation and increased filling pressure (grade 2 diastolic dysfunction).   Admitted to [facility] 01/26/2012 after his ICD fired 5 times Potassium >7.5 Required urgent dialysis. Discharged form [facility] 01/29/12. Discharge weight 250 pounds.   CPX test 12/13  Resting HR: 90 Peak HR: 150 % age predicted max HR: 89% BP rest: 121/88 BP peak: 189/112 Peak VO2: 15.2 % predicted peak VO2: 58.3% (When adjusted to the patient's ideal body weight of 176 lb (79.8 kg) the peak VO2 is 22 ml/kg (ibw)/min (63% of the ibw-adjusted predicted) VE/VCO2 slope: 26.4 OUES: 2.29 Peak RER: 1.05 Ventilatory Threshold: 12.3 % predicted peak VO2: 47.2%  ECHO 02/25/13 EF <15%   Follow up: Doing well. Still doing BCG treatment for bladder. Denies SOB, PND, orthopnea or CP. Compliant with CPAP. Weight at home 250 lbs. Compliant with meds. Able to go upstairs. Working full time in IT. + hip pain.   Labs  12/12/12 dig level 0.9 12/18/12 Magnesium 1.1 K 4.0 Creatinine 1.5 01/02/13: Uric acid 12.9, K+ 3.9, Cr  1.37, AST 21, ALT 19, Mag 1.3 01/29/13 K 4.1 Creatinine 1.5 Magnesium 1.5 03/21/13 Magnesium 1.7 TSH 1.11--> synthorid increased to 100 mcg.    ROS: All systems negative except as listed in HPI, PMH and Problem List.  Past Medical History  Diagnosis Date  . Gout 08/03/12    PT C/O OF RIGHT KNEE PAIN AND SWELLING -STATES GOUT FLARE UP - ONGOING SINCE APRIL - BUT SWELLING W/IN LAST WEEK  . Cardiomyopathy     Idiopathic dilated;   Marland Kitchen Ventricular tachycardia     s/p ICD  . Sleep apnea     CPAP  . Systolic CHF     EF 81%  . CKD (chronic kidney disease)     stage III baseline Crt 1.8-2.0  . Biventricular ICD (implantable cardiac defibrillator) Medtronic ]     DOI 2008/ upgrade 2010/ Gen Change 2014  . Automatic implantable cardioverter-defibrillator in situ   . CHF (congestive heart failure)   . Bladder tumor     PT HOSP AT [facility] 4/24 TO 4/26 2014 WITH UTI--AND FOUND TO HAVE BLADDER TUMOR-  . Arthritis     GOUT  . Hilar mass     Noted CT 2010  . HTN (hypertension)     Dr. Caryl Comes cardiologist    Current Outpatient Prescriptions  Medication Sig Dispense Refill  . atorvastatin (LIPITOR) 40 MG tablet Take 40 mg by mouth at bedtime.       . carvedilol (COREG) 25 MG tablet Take 1 tablet (25 mg  total) by mouth 2 (two) times daily with a meal.  60 tablet  6  . colchicine 0.6 MG tablet Take 0.6 mg by mouth daily as needed (gout).      Marland Kitchen digoxin (LANOXIN) 0.25 MG tablet Take 0.5 tablets (0.125 mg total) by mouth daily.  15 tablet  6  . febuxostat (ULORIC) 40 MG tablet Take 40 mg by mouth daily.       . furosemide (LASIX) 40 MG tablet Take 1 tablet (40 mg total) by mouth every other day.  17 tablet  6  . levothyroxine (SYNTHROID, LEVOTHROID) 50 MCG tablet Take 100 mcg by mouth daily before breakfast.       . magnesium oxide (MAG-OX) 400 (241.3 MG) MG tablet Take 1 tablet (400 mg total) by mouth 2 (two) times daily.  60 tablet  6  . olmesartan (BENICAR) 20 MG tablet TAKE 1 TABLET BY MOUTH ONCE  A DAY      . OVER THE COUNTER MEDICATION Place 1 drop into both eyes daily as needed (red eyes). Over the counter eye drops for redness      . promethazine-codeine (PHENERGAN WITH CODEINE) 6.25-10 MG/5ML syrup Take 5-10 mLs by mouth every 6 (six) hours as needed.  120 mL  0  . pseudoephedrine-guaifenesin (MUCINEX D) 60-600 MG per tablet Take 1 tablet by mouth every 12 (twelve) hours.  18 tablet  0   No current facility-administered medications for this encounter.     Filed Vitals:   06/12/13 1415  BP: 116/84  Pulse: 86  Resp: 18  Weight: 251 lb (113.853 kg)  SpO2: 98%    PHYSICAL EXAM: General:  Well appearing. No resp difficulty HEENT: normal Neck: supple. JVP difficult to assess but appears flat. Carotids 2+ bilaterally; no bruits. No lymphadenopathy or thryomegaly appreciated. R neck scar  Cor: PMI normal. Regular rate & rhythm. No rubs, gallops or murmurs. Lungs: clear Abdomen: obese, soft, nontender, nondistended. No hepatosplenomegaly. No bruits or masses. Good bowel sounds. Extremities: no cyanosis, clubbing, rash, edema Neuro: alert & orientedx3, cranial nerves grossly intact. Moves all 4 extremities w/o difficulty. Affect pleasant.   ASSESSMENT & PLAN:  1) Chronic systolic HF: NICM, EF 29% (02/2013) s/p Medtronic Optivol  ICD 04/2012 .  - NYHA II symptoms and volume status stable will continue lasix 40 mg QOD.  - On goal dose coreg 25 mg BID. - Continue digoxin 0.125 mg daily and benicar 20 mg daily. Check BMET an dig level today. -He is intoelrant to numerous HF meds including spiro (severe hyperkalemia - requiring HD), hydralazine (fuzzy-headed) and imdur (severe HAs). - His EF has been 10-15% since at least 2008 and discussed with him concern that he may need advanced therapies at some point. He is Type A, however is being treated for bladder cancer currently and would have to be in remission for at least 5 years to consider heart tx. He could possibly need LVAD in the  future and discussed repeating CPX test to assess. Also discussed to call if at any point he thinks he is not doing well.  - Reinforced the need and importance of daily weights, a low sodium diet, and fluid restriction (less than 2 L a day). Instructed to call the HF clinic if weight increases more than 3 lbs overnight or 5 lbs in a week.  2) OSA -Continue CPAP nightly 3) Bladder Cancer - Continuing with BCG treatment 4) VT - quiescent. Followed by Dr. Caryl Comes 5) Hypomagnesemia - Will check Mag  level today. 6) Hip pain - Reports feeling like having gout flare in hip. Will check uric acid level today.    CPX, F/U 3 months Rande Brunt NP-C 2:18 PM

## 2013-06-26 ENCOUNTER — Encounter: Payer: Self-pay | Admitting: Internal Medicine

## 2013-06-27 ENCOUNTER — Other Ambulatory Visit: Payer: Self-pay | Admitting: Internal Medicine

## 2013-06-27 DIAGNOSIS — E049 Nontoxic goiter, unspecified: Secondary | ICD-10-CM

## 2013-06-28 ENCOUNTER — Ambulatory Visit
Admission: RE | Admit: 2013-06-28 | Discharge: 2013-06-28 | Disposition: A | Discharge status: Home / Self Care | Payer: 59 | Source: Ambulatory Visit | Attending: Internal Medicine | Admitting: Internal Medicine

## 2013-06-28 DIAGNOSIS — E049 Nontoxic goiter, unspecified: Secondary | ICD-10-CM

## 2013-07-01 ENCOUNTER — Encounter (HOSPITAL_COMMUNITY): Payer: 59

## 2013-07-02 ENCOUNTER — Ambulatory Visit (HOSPITAL_COMMUNITY)
Admission: RE | Admit: 2013-07-02 | Discharge: 2013-07-02 | Disposition: A | Discharge status: Home / Self Care | Payer: 59 | Source: Ambulatory Visit | Attending: Internal Medicine | Admitting: Internal Medicine

## 2013-07-02 DIAGNOSIS — I5042 Chronic combined systolic (congestive) and diastolic (congestive) heart failure: Secondary | ICD-10-CM

## 2013-07-05 ENCOUNTER — Other Ambulatory Visit: Payer: Self-pay

## 2013-07-05 DIAGNOSIS — E049 Nontoxic goiter, unspecified: Secondary | ICD-10-CM

## 2013-07-05 NOTE — Progress Notes (Signed)
Heart Failure Diagnostics Follow Up Form - Medtronic    Fluid Index        ___   Fluid index trending with baseline       _X__   Fluid index trending up but below threshold, increasing since about 4/18       ___   Fluid index above threshold       ___   More than 3 threshold crossings this year        Current diuretic dose: . Lasix 40 mg QOD                                                                                                 Baseline weight: .                                      Current Weight: .                                     Patient Activity        Current activity level: .      2 hours daily                                                    Baseline activity level: .    About 1 & 1/2 hours daily                                                      AT/AF Burden        ___  Increase in AT/AF burden       ___  New onset of AT/AF       _X__  No AT/AF         ICD Therapies        ___ 1 or more ICD shocks    Interventions       _X__  Adjust medications: .   Pt will take 1 extra dose of Lasix, he states he has not been weighing himself recently but will restart, he denies edema or SOB              ___  Labs: .  ___  Need for follow up appointment: .                                                                                _X__  HF diagnostics follow up: .  08/05/13                                                                                        ___  Review with EP: Marland Kitchen                                                                                                         Patient notified: Date: .      07/05/13                          By: .  Rhunette Croft     Phone   .       Letter

## 2013-07-08 ENCOUNTER — Ambulatory Visit (HOSPITAL_COMMUNITY)
Admission: RE | Admit: 2013-07-08 | Discharge: 2013-07-08 | Disposition: A | Discharge status: Home / Self Care | Payer: 59 | Source: Ambulatory Visit | Attending: Cardiothoracic Surgery | Admitting: Cardiothoracic Surgery

## 2013-07-08 ENCOUNTER — Encounter: Payer: 59 | Admitting: *Deleted

## 2013-07-08 DIAGNOSIS — E049 Nontoxic goiter, unspecified: Secondary | ICD-10-CM | POA: Insufficient documentation

## 2013-07-08 MED ORDER — ALBUTEROL SULFATE (2.5 MG/3ML) 0.083% IN NEBU
2.5000 mg | INHALATION_SOLUTION | Freq: Once | RESPIRATORY_TRACT | Status: AC
  Administered 2013-07-08: 2.5 mg via RESPIRATORY_TRACT

## 2013-07-09 ENCOUNTER — Ambulatory Visit (INDEPENDENT_AMBULATORY_CARE_PROVIDER_SITE_OTHER): Payer: 59 | Admitting: Cardiothoracic Surgery

## 2013-07-09 ENCOUNTER — Encounter: Payer: Self-pay | Admitting: Cardiothoracic Surgery

## 2013-07-09 VITALS — BP 132/87 | HR 87 | Resp 20 | Ht 71.0 in | Wt 250.0 lb

## 2013-07-09 DIAGNOSIS — E049 Nontoxic goiter, unspecified: Secondary | ICD-10-CM

## 2013-07-09 DIAGNOSIS — R222 Localized swelling, mass and lump, trunk: Secondary | ICD-10-CM

## 2013-07-09 DIAGNOSIS — I509 Heart failure, unspecified: Secondary | ICD-10-CM

## 2013-07-09 DIAGNOSIS — J9859 Other diseases of mediastinum, not elsewhere classified: Secondary | ICD-10-CM

## 2013-07-09 NOTE — Progress Notes (Signed)
PCP is Gennette Pac, MD Referring Provider is Hulan Fess, MD  Chief Complaint  Patient presents with  . 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 However the mass is continued to increase in size over the past year from 6 x 6 cm nodular 8 x 7 cm with extrinsic compression of both the esophagus and trachea. The patient minimizes his symptoms however. He still works full time. His endocrinologist feels after an appropriate attempt at medical therapy with Synthroid the mass is still growing and that surgical extirpation should be considered. The patient however is extremely high-risk for surgery with idiopathic cardiomyopathy and an ejection fraction of 10% followed in the advanced heart failure clinic. Last echocardiogram was reviewed personally with EF of 15% and a left ventricular end-diastolic diameter of 7 cm. The patient has an AICD and history of ventricular arrhythmias. The patient is scheduled for a CPX exercise physiology study in one week at the advanced heart failure clinic. The patient is also scheduled for a PET scan in preparation for probable sternotomy and resection of the peritracheal mass to assess its malignant potential.  Pulley function testing done recently indicates FVC of 1.7 FEV1 1.2-both approximate 40% of predicted. Diffusion capacity is 61% of predicted. He was felt to have severe obstructive and restrictive lung disease with moderate diffusion defect.  Past Medical History  Diagnosis Date  . Gout 08/03/12    PT C/O OF RIGHT KNEE PAIN AND SWELLING -STATES GOUT FLARE UP - ONGOING SINCE APRIL - BUT SWELLING W/IN LAST WEEK  . Cardiomyopathy     Idiopathic dilated;   Marland Kitchen Ventricular tachycardia     s/p ICD  . Sleep apnea     CPAP  . Systolic CHF     EF 67%  . CKD (chronic kidney disease)     stage III  baseline Crt 1.8-2.0  . Biventricular ICD (implantable cardiac defibrillator) Medtronic ]     DOI 2008/ upgrade 2010/ Gen Change 2014  . Automatic implantable cardioverter-defibrillator in situ   . CHF (congestive heart failure)   . Bladder tumor     PT HOSP AT [facility] 4/24 TO 4/26 2014 WITH UTI--AND FOUND TO HAVE BLADDER TUMOR-  . Arthritis     GOUT  . Hilar mass     Noted CT 2010  . HTN (hypertension)     Dr. Caryl Comes cardiologist    Past Surgical History  Procedure Laterality Date  . Cholecystectomy    . Cardiac catheterization      he was found to have normal coronary arteries but with a globally dilated and hypocontractile heart  . US echocardiography  03-17-2008, 07-03-2006    EF 15-20%, EF 15-20%  . Transurethral resection of bladder tumor N/A 08/13/2012    Procedure: TRANSURETHRAL RESECTION OF BLADDER TUMOR (TURBT);  Surgeon: Claybon Jabs, MD;  [facility]: [facility];  Service: Urology;  Laterality: N/A;  MITOMYCIN C  . Icd insertion  04/2012-new ICD  . Endobronchial ultrasound Bilateral 10/01/2012    Procedure: ENDOBRONCHIAL ULTRASOUND;  Surgeon: Collene Gobble, MD;  [facility]: [facility];  Service: Cardiopulmonary;  Laterality: Bilateral;  . Cornea replacement    . Video bronchoscopy N/A 10/25/2012    Procedure: VIDEO BRONCHOSCOPY;  Surgeon: [name], MD;  Location: [place];  Service: Thoracic;  Laterality: N/A;  . Mediastinoscopy N/A 10/25/2012  Procedure: MEDIASTINOSCOPY;  Surgeon: [name], MD;  Location: [facility];  Service: Thoracic;  Laterality: N/A;    No family history on file.  Social History History  Substance Use Topics  . Smoking status: Never Smoker   . Smokeless tobacco: Never Used  . Alcohol Use: No    Current Outpatient Prescriptions  Medication Sig Dispense Refill  . atorvastatin (LIPITOR) 40 MG tablet Take 40 mg by mouth at bedtime.       . carvedilol (COREG) 25 MG tablet Take 1 tablet (25 mg total) by mouth 2 (two) times daily with a meal.  60  tablet  6  . colchicine 0.6 MG tablet Take 0.6 mg by mouth daily as needed (gout).      Marland Kitchen digoxin (LANOXIN) 0.25 MG tablet Take 0.5 tablets (0.125 mg total) by mouth daily.  15 tablet  6  . febuxostat (ULORIC) 40 MG tablet Take 40 mg by mouth daily.       . furosemide (LASIX) 40 MG tablet Take 1 tablet (40 mg total) by mouth every other day.  17 tablet  6  . levothyroxine (SYNTHROID, LEVOTHROID) 50 MCG tablet Take 100 mcg by mouth daily before breakfast.       . magnesium oxide (MAG-OX) 400 (241.3 MG) MG tablet Take 1 tablet (400 mg total) by mouth 2 (two) times daily.  60 tablet  6  . olmesartan (BENICAR) 20 MG tablet TAKE 1 TABLET BY MOUTH ONCE A DAY      . OVER THE COUNTER MEDICATION Place 1 drop into both eyes daily as needed (red eyes). Over the counter eye drops for redness       No current facility-administered medications for this visit.    Allergies  Allergen Reactions  . Bidil [Isosorb Dinitrate-Hydralazine]     Headaches     Review of Systems No ankle swelling orthopnea No significant weight change consistent with fluid retention No symptoms of upper airway infection or  sinusitis No history DVT He is not a diabetic  The patient underwent previous mediastinoscopy and tolerated general anesthesia with well  The patient has a history of a submucosal bladder tumor treated with resection and now BCG irrigation at the urology clinic every 3 months. He just had a BCG irrigation within the past 2 weeks. There is no evidence recurrence according to the patient. 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  Patient was told that he will probably need surgical resection via sternotomy. Prior to surgery we will await the results of his PET scan and his CPX exercise physiology study to assess his cardiopulmonary reserve. He return in approximately 2 weeks.  Plan:

## 2013-07-10 ENCOUNTER — Ambulatory Visit: Payer: 59 | Admitting: Cardiothoracic Surgery

## 2013-07-11 ENCOUNTER — Other Ambulatory Visit: Payer: Self-pay

## 2013-07-11 DIAGNOSIS — R222 Localized swelling, mass and lump, trunk: Secondary | ICD-10-CM

## 2013-07-15 ENCOUNTER — Encounter (HOSPITAL_COMMUNITY): Payer: Self-pay

## 2013-07-16 ENCOUNTER — Ambulatory Visit (HOSPITAL_COMMUNITY): Payer: 59 | Attending: Internal Medicine

## 2013-07-16 DIAGNOSIS — I5042 Chronic combined systolic (congestive) and diastolic (congestive) heart failure: Secondary | ICD-10-CM

## 2013-07-18 LAB — PULMONARY FUNCTION TEST
DL/VA % pred: 162 %
DL/VA: 7.51 ml/min/mmHg/L
DLCO unc % pred: 61 %
DLCO unc: 19.78 ml/min/mmHg
FEF 25-75 Post: 1.33 L/sec
FEF 25-75 Pre: 0.7 L/sec
FEF2575-%Change-Post: 91 %
FEF2575-%Pred-Post: 42 %
FEF2575-%Pred-Pre: 22 %
FEV1-%Change-Post: 12 %
FEV1-%Pred-Post: 42 %
FEV1-%Pred-Pre: 37 %
FEV1-Post: 1.39 L
FEV1-Pre: 1.24 L
FEV1FVC-%Change-Post: 2 %
FEV1FVC-%Pred-Pre: 90 %
FEV6-%Change-Post: 9 %
FEV6-%Pred-Post: 47 %
FEV6-%Pred-Pre: 43 %
FEV6-Post: 1.91 L
FEV6-Pre: 1.74 L
FEV6FVC-%Pred-Post: 103 %
FEV6FVC-%Pred-Pre: 103 %
FVC-%Change-Post: 9 %
FVC-%Pred-Post: 46 %
FVC-%Pred-Pre: 42 %
FVC-Post: 1.91 L
FVC-Pre: 1.74 L
Post FEV1/FVC ratio: 73 %
Post FEV6/FVC ratio: 100 %
Pre FEV1/FVC ratio: 71 %
Pre FEV6/FVC Ratio: 100 %
RV % pred: 77 %
RV: 1.64 L
TLC % pred: 52 %
TLC: 3.67 L

## 2013-07-19 ENCOUNTER — Encounter: Payer: Self-pay | Admitting: Cardiology

## 2013-07-22 ENCOUNTER — Encounter: Payer: Self-pay | Admitting: Internal Medicine

## 2013-07-23 ENCOUNTER — Ambulatory Visit (HOSPITAL_COMMUNITY)
Admission: RE | Admit: 2013-07-23 | Discharge: 2013-07-23 | Disposition: A | Discharge status: Home / Self Care | Payer: 59 | Source: Ambulatory Visit | Attending: Cardiothoracic Surgery | Admitting: Cardiothoracic Surgery

## 2013-07-23 ENCOUNTER — Encounter (HOSPITAL_COMMUNITY): Payer: Self-pay

## 2013-07-23 DIAGNOSIS — I517 Cardiomegaly: Secondary | ICD-10-CM | POA: Insufficient documentation

## 2013-07-23 DIAGNOSIS — K5730 Diverticulosis of large intestine without perforation or abscess without bleeding: Secondary | ICD-10-CM | POA: Insufficient documentation

## 2013-07-23 DIAGNOSIS — Z950 Presence of cardiac pacemaker: Secondary | ICD-10-CM | POA: Insufficient documentation

## 2013-07-23 DIAGNOSIS — J8410 Pulmonary fibrosis, unspecified: Secondary | ICD-10-CM | POA: Insufficient documentation

## 2013-07-23 DIAGNOSIS — C679 Malignant neoplasm of bladder, unspecified: Secondary | ICD-10-CM | POA: Insufficient documentation

## 2013-07-23 DIAGNOSIS — E0789 Other specified disorders of thyroid: Secondary | ICD-10-CM | POA: Insufficient documentation

## 2013-07-23 DIAGNOSIS — R222 Localized swelling, mass and lump, trunk: Secondary | ICD-10-CM | POA: Insufficient documentation

## 2013-07-23 DIAGNOSIS — J90 Pleural effusion, not elsewhere classified: Secondary | ICD-10-CM | POA: Insufficient documentation

## 2013-07-23 LAB — GLUCOSE, CAPILLARY: Glucose-Capillary: 110 mg/dL — ABNORMAL HIGH (ref 70–99)

## 2013-07-23 MED ORDER — FLUDEOXYGLUCOSE F - 18 (FDG) INJECTION
13.3000 | Freq: Once | INTRAVENOUS | Status: AC | PRN
  Administered 2013-07-23: 13.3 via INTRAVENOUS

## 2013-07-24 ENCOUNTER — Other Ambulatory Visit: Payer: Self-pay

## 2013-07-24 DIAGNOSIS — J398 Other specified diseases of upper respiratory tract: Secondary | ICD-10-CM

## 2013-07-31 ENCOUNTER — Encounter: Payer: Self-pay | Admitting: Internal Medicine

## 2013-07-31 ENCOUNTER — Ambulatory Visit (INDEPENDENT_AMBULATORY_CARE_PROVIDER_SITE_OTHER): Payer: 59 | Admitting: *Deleted

## 2013-07-31 DIAGNOSIS — I4729 Other ventricular tachycardia: Secondary | ICD-10-CM

## 2013-07-31 DIAGNOSIS — I472 Ventricular tachycardia: Secondary | ICD-10-CM

## 2013-07-31 DIAGNOSIS — I5022 Chronic systolic (congestive) heart failure: Secondary | ICD-10-CM

## 2013-08-01 NOTE — Progress Notes (Signed)
Remote ICD transmission.   

## 2013-08-06 ENCOUNTER — Ambulatory Visit (HOSPITAL_COMMUNITY)
Admission: RE | Admit: 2013-08-06 | Discharge: 2013-08-06 | Disposition: A | Discharge status: Home / Self Care | Payer: 59 | Source: Ambulatory Visit | Attending: Internal Medicine | Admitting: Internal Medicine

## 2013-08-06 DIAGNOSIS — I5022 Chronic systolic (congestive) heart failure: Secondary | ICD-10-CM

## 2013-08-12 NOTE — Progress Notes (Signed)
Heart Failure Diagnostics Follow Up Form - Medtronic    Fluid Index        ___   Fluid index trending with baseline       _X__   Fluid index trending up but below threshold       ___   Fluid index above threshold       ___   More than 3 threshold crossings this year        Current diuretic dose: .  Lasix 40 mg every other day        Baseline weight: .                                      Current Weight: .                                     Patient Activity        Current activity level: .    2 hours daily                                                      Baseline activity level: .     1 & 1/2- 2 hours daily                                                     AT/AF Burden        ___  Increase in AT/AF burden       ___  New onset of AT/AF       _X__  No AT/AF         ICD Therapies        ___ 1 or more ICD shocks    Interventions       _X__  Adjust medications: .  Pt will take an extra dose of Lasix today                                                                                                 ___  Labs: .  ___  Need for follow up appointment: .                                                                                _X__  HF diagnostics follow up: .   09/09/13                                                                                       ___  Review with EP: Marland Kitchen                                                                                                         Patient notified: Date: .   08/12/13                             By: .  Rhunette Croft     Phone   .       Letter

## 2013-08-13 ENCOUNTER — Ambulatory Visit: Payer: 59 | Admitting: Cardiothoracic Surgery

## 2013-08-13 ENCOUNTER — Ambulatory Visit (INDEPENDENT_AMBULATORY_CARE_PROVIDER_SITE_OTHER): Payer: 59 | Admitting: Cardiothoracic Surgery

## 2013-08-13 ENCOUNTER — Other Ambulatory Visit: Payer: Self-pay

## 2013-08-13 ENCOUNTER — Encounter: Payer: Self-pay | Admitting: Cardiothoracic Surgery

## 2013-08-13 VITALS — BP 140/92 | HR 100 | Resp 20 | Ht 71.0 in | Wt 250.0 lb

## 2013-08-13 DIAGNOSIS — J398 Other specified diseases of upper respiratory tract: Secondary | ICD-10-CM

## 2013-08-13 DIAGNOSIS — J9859 Other diseases of mediastinum, not elsewhere classified: Secondary | ICD-10-CM

## 2013-08-13 DIAGNOSIS — R222 Localized swelling, mass and lump, trunk: Secondary | ICD-10-CM

## 2013-08-13 NOTE — Progress Notes (Signed)
PCP is Gennette Pac, MD Referring Provider is Bensimhon, Shaune Pascal, MD  Chief Complaint  Patient presents with  . Follow-up    discuss PET Scan and CPX study results    HPI: The patient returns for discussion of the right paratracheal mass which has been slowly growing over several years. Unfortunately the patient did not proceed with a IR CT-guided biopsy. 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  Unfortunately the patient has severe idiopathic cardiomyopathy. He recently underwent an exercise physiology test and his V02 max was was only 9.1 indicating very limited cardiac reserve.   Past Medical History  Diagnosis Date  . Gout 08/03/12    PT C/O OF RIGHT KNEE PAIN AND SWELLING -STATES GOUT FLARE UP - ONGOING SINCE APRIL - BUT SWELLING W/IN LAST WEEK  . Cardiomyopathy     Idiopathic dilated;   Marland Kitchen Ventricular tachycardia     s/p ICD  . Sleep apnea     CPAP  . Systolic CHF     EF 25%  . CKD (chronic kidney disease)     stage III baseline Crt 1.8-2.0  . Biventricular ICD (implantable cardiac defibrillator) Medtronic ]     DOI 2008/ upgrade 2010/ Gen Change 2014  . Automatic implantable cardioverter-defibrillator in situ   . CHF (congestive heart failure)   . Bladder tumor     PT HOSP AT [facility] 4/24 TO 4/26 2014 WITH UTI--AND FOUND TO HAVE BLADDER TUMOR-  . Arthritis     GOUT  . Hilar mass     Noted CT 2010  . HTN (hypertension)     Dr. Caryl Comes cardiologist    Past Surgical History  Procedure Laterality Date  . Cholecystectomy    . Cardiac catheterization      he was found to have normal coronary arteries but with a globally dilated and hypocontractile heart  . US echocardiography  03-17-2008, 07-03-2006    EF 15-20%, EF 15-20%  . Transurethral resection of bladder tumor  N/A 08/13/2012    Procedure: TRANSURETHRAL RESECTION OF BLADDER TUMOR (TURBT);  Surgeon: Claybon Jabs, MD;  Location: [facility];  Service: Urology;  Laterality: N/A;  MITOMYCIN C  . Icd insertion  04/2012-new ICD  . Endobronchial ultrasound Bilateral 10/01/2012    Procedure: ENDOBRONCHIAL ULTRASOUND;  Surgeon: Collene Gobble, MD;  Location: [facility];  Service: Cardiopulmonary;  Laterality: Bilateral;  . Cornea replacement    . Video bronchoscopy N/A 10/25/2012    Procedure: VIDEO BRONCHOSCOPY;  Surgeon: [name], MD;  Location: [place];  Service: Thoracic;  Laterality: N/A;  . Mediastinoscopy N/A 10/25/2012    Procedure: MEDIASTINOSCOPY;  Surgeon: [name], MD;  Location: [place];  Service: Thoracic;  Laterality: N/A;    No family history on file.  Social History History  Substance Use Topics  . Smoking status: Never Smoker   . Smokeless tobacco: Never Used  . Alcohol Use: No    Current Outpatient Prescriptions  Medication Sig Dispense Refill  . atorvastatin (LIPITOR) 40 MG tablet Take 40 mg by mouth at bedtime.       . carvedilol (COREG) 25 MG tablet Take 1 tablet (25 mg total) by mouth 2 (two) times daily with a meal.  60 tablet  6  . colchicine 0.6 MG tablet  Take 0.6 mg by mouth daily as needed (gout).      Marland Kitchen digoxin (LANOXIN) 0.25 MG tablet Take 0.5 tablets (0.125 mg total) by mouth daily.  15 tablet  6  . febuxostat (ULORIC) 40 MG tablet Take 40 mg by mouth daily.       . furosemide (LASIX) 40 MG tablet Take 1 tablet (40 mg total) by mouth every other day.  17 tablet  6  . levothyroxine (SYNTHROID, LEVOTHROID) 50 MCG tablet Take 100 mcg by mouth daily before breakfast.       . magnesium oxide (MAG-OX) 400 (241.3 MG) MG tablet Take 1 tablet (400 mg total) by mouth 2 (two) times daily.  60 tablet  6  . olmesartan (BENICAR) 20 MG tablet TAKE 1 TABLET BY MOUTH ONCE A DAY      . OVER THE COUNTER MEDICATION Place 1 drop into both eyes daily as needed (red eyes). Over the  counter eye drops for redness       No current facility-administered medications for this visit.    Allergies  Allergen Reactions  . Bidil [Isosorb Dinitrate-Hydralazine]     Headaches     Review of Systems heart failure symptoms fairly stable He has symptoms with difficulty swallowing secondary to the mediastinal mass, new onset No symptoms of hand swelling or SVC syndrome Patient has a ultrasound of the scrotum planned at [facility] for evaluation of his testicles-prior history of bladder cancer.  BP 140/92  Pulse 100  Resp 20  Ht 5\' 11"  (1.803 m)  Wt 250 lb (113.399 kg)  BMI 34.88 kg/m2  SpO2 97% Physical Exam Alert and comfortable Breath sounds clear Mild pedal edema No palpable neck mass but his neck is very short and thick  Diagnostic Tests:   Impression: Mediastinal mass with a previous biopsy showing thyroid tissue has not responded to suppression therapy. Prior to proceeding with high-risk resection which would require sternotomy we will rebiopsy the mass with interventional radiology to confirm the diagnosis or rule out lymphoma which would alter the therapeutic approach..   Patient will proceed with CT-guided biopsy. I spoke with the radiologist and the first target will be a cervical lymph node with hypermetabolic activity and PET scan. If more tissue as needed for lymphoma workup  a core biopsy of the mediastinal mass will performed.  Plan: Return after biopsy to discuss results and therapy

## 2013-08-15 ENCOUNTER — Encounter (HOSPITAL_COMMUNITY): Payer: Self-pay | Admitting: Pharmacy Technician

## 2013-08-16 LAB — MDC_IDC_ENUM_SESS_TYPE_REMOTE
Battery Remaining Longevity: 81 mo
Brady Statistic AP VP Percent: 0.04 %
Brady Statistic AP VS Percent: 0.01 %
Brady Statistic AS VP Percent: 99.28 %
Brady Statistic AS VS Percent: 0.67 %
HIGH POWER IMPEDANCE MEASURED VALUE: 47 Ohm
HIGH POWER IMPEDANCE MEASURED VALUE: 58 Ohm
HighPow Impedance: 190 Ohm
Lead Channel Impedance Value: 285 Ohm
Lead Channel Impedance Value: 456 Ohm
Lead Channel Impedance Value: 456 Ohm
Lead Channel Impedance Value: 589 Ohm
Lead Channel Pacing Threshold Amplitude: 0.5 V
Lead Channel Pacing Threshold Amplitude: 0.625 V
Lead Channel Pacing Threshold Amplitude: 0.875 V
Lead Channel Pacing Threshold Pulse Width: 0.4 ms
Lead Channel Pacing Threshold Pulse Width: 0.4 ms
Lead Channel Sensing Intrinsic Amplitude: 18.625 mV
Lead Channel Sensing Intrinsic Amplitude: 3.25 mV
Lead Channel Setting Pacing Amplitude: 2 V
Lead Channel Setting Pacing Amplitude: 2.25 V
Lead Channel Setting Pacing Pulse Width: 0.4 ms
Lead Channel Setting Sensing Sensitivity: 0.6 mV
MDC IDC MSMT BATTERY VOLTAGE: 2.99 V
MDC IDC MSMT LEADCHNL RA IMPEDANCE VALUE: 456 Ohm
MDC IDC MSMT LEADCHNL RV PACING THRESHOLD PULSEWIDTH: 0.4 ms
MDC IDC SESS DTM: 20150528032801
MDC IDC SET LEADCHNL LV PACING PULSEWIDTH: 0.4 ms
MDC IDC SET LEADCHNL RV PACING AMPLITUDE: 2.5 V
MDC IDC SET ZONE DETECTION INTERVAL: 400 ms
MDC IDC SET ZONE DETECTION INTERVAL: 450 ms
MDC IDC STAT BRADY RA PERCENT PACED: 0.05 %
MDC IDC STAT BRADY RV PERCENT PACED: 98.72 %
Zone Setting Detection Interval: 250 ms
Zone Setting Detection Interval: 300 ms
Zone Setting Detection Interval: 410 ms

## 2013-08-19 ENCOUNTER — Other Ambulatory Visit: Payer: Self-pay | Admitting: Radiology

## 2013-08-20 ENCOUNTER — Other Ambulatory Visit: Payer: Self-pay | Admitting: Cardiothoracic Surgery

## 2013-08-20 ENCOUNTER — Ambulatory Visit (HOSPITAL_COMMUNITY)
Admission: RE | Admit: 2013-08-20 | Discharge: 2013-08-20 | Disposition: A | Discharge status: Home / Self Care | Payer: 59 | Source: Ambulatory Visit | Attending: Cardiothoracic Surgery | Admitting: Cardiothoracic Surgery

## 2013-08-20 DIAGNOSIS — Z8551 Personal history of malignant neoplasm of bladder: Secondary | ICD-10-CM | POA: Insufficient documentation

## 2013-08-20 DIAGNOSIS — J398 Other specified diseases of upper respiratory tract: Secondary | ICD-10-CM

## 2013-08-20 DIAGNOSIS — R599 Enlarged lymph nodes, unspecified: Secondary | ICD-10-CM | POA: Insufficient documentation

## 2013-08-20 NOTE — Procedures (Signed)
Interventional Radiology Procedure Note  Procedure: US guided FNA and core bx of left cervical LNs Complications: None Recommendations: - Path pending  Signed,  Criselda Peaches, MD [facility]

## 2013-08-22 ENCOUNTER — Encounter: Payer: Self-pay | Admitting: Internal Medicine

## 2013-08-26 ENCOUNTER — Other Ambulatory Visit: Payer: Self-pay | Admitting: *Deleted

## 2013-08-26 ENCOUNTER — Ambulatory Visit: Payer: 59 | Admitting: Cardiothoracic Surgery

## 2013-08-26 DIAGNOSIS — R222 Localized swelling, mass and lump, trunk: Secondary | ICD-10-CM

## 2013-08-27 ENCOUNTER — Encounter: Payer: Self-pay | Admitting: Cardiology

## 2013-08-27 ENCOUNTER — Ambulatory Visit
Admission: RE | Admit: 2013-08-27 | Discharge: 2013-08-27 | Disposition: A | Discharge status: Home / Self Care | Payer: 59 | Source: Ambulatory Visit | Attending: Cardiothoracic Surgery | Admitting: Cardiothoracic Surgery

## 2013-08-27 ENCOUNTER — Ambulatory Visit (INDEPENDENT_AMBULATORY_CARE_PROVIDER_SITE_OTHER): Payer: 59 | Admitting: Cardiothoracic Surgery

## 2013-08-27 ENCOUNTER — Other Ambulatory Visit: Payer: Self-pay | Admitting: *Deleted

## 2013-08-27 VITALS — BP 139/94 | HR 90 | Resp 16 | Ht 71.0 in | Wt 250.0 lb

## 2013-08-27 DIAGNOSIS — J9859 Other diseases of mediastinum, not elsewhere classified: Secondary | ICD-10-CM

## 2013-08-27 DIAGNOSIS — Z9889 Other specified postprocedural states: Secondary | ICD-10-CM

## 2013-08-27 DIAGNOSIS — R222 Localized swelling, mass and lump, trunk: Secondary | ICD-10-CM

## 2013-08-27 DIAGNOSIS — J398 Other specified diseases of upper respiratory tract: Secondary | ICD-10-CM

## 2013-08-27 NOTE — Progress Notes (Signed)
PCP is Gennette Pac, MD Referring Provider is Collene Gobble, MD  Chief Complaint  Patient presents with  . Follow-up    after NB or right paratracheal mass/node...with CT CHEST    HPI: Mr. [name] returns for further evaluation of a slowly enlarging right mediastinal mass with compression of the trachea and esophagus. Last year a transbronchial EBUS biopsy performed by pulmonary was nondiagnostic. He septally had a mediastinoscopy by myself which returned thyroid tissue. A thyroid scan subtotally showed some activity in the mediastinum and he underwent thyroid suppressive therapy by Dr. Buddy Duty without success and in fact the mass is enlarging. Because of concern this could relieve be a lymphoma PET scan was performed showing hypermetabolic activity as well as hypermetabolic activity of a left cervical lymph node. The lymph node was biopsied by IR and this showed lymphoid tissue but scant sample size, not enough to perform flow cytometry. The patient presents today to discuss open biopsy via right anterior thoracotomy [name] procedure) under general anesthesia in order to obtain sufficient tissue for diagnosis and lymphoma markers. The patient has no history of night sweats or weight loss. He has mild dysphagia from esophageal compression.  The patient has prior history of bladder cancer treated with submucosal resection.  Patient has idiopathic cardiomyopathy filed at the advanced heart failure clinic-last echocardiogram last fall showed EF 10%. However the patient is well compensated and is able to work full-time for the [employer] He is not been admitted for heart failure in over a year. The patient will be evaluated by the advanced heart failure clinic prior to undergoing right anterior [place] procedure and biopsy.   Past Medical History  Diagnosis Date  . Gout 08/03/12    PT C/O OF RIGHT KNEE PAIN AND SWELLING -STATES GOUT FLARE UP - ONGOING SINCE APRIL - BUT SWELLING  W/IN LAST WEEK  . Cardiomyopathy     Idiopathic dilated;   Marland Kitchen Ventricular tachycardia     s/p ICD  . Sleep apnea     CPAP  . Systolic CHF     EF 15%  . CKD (chronic kidney disease)     stage III baseline Crt 1.8-2.0  . Biventricular ICD (implantable cardiac defibrillator) Medtronic ]     DOI 2008/ upgrade 2010/ Gen Change 2014  . Automatic implantable cardioverter-defibrillator in situ   . CHF (congestive heart failure)   . Bladder tumor     PT HOSP AT [facility] 4/24 TO 4/26 2014 WITH UTI--AND FOUND TO HAVE BLADDER TUMOR-  . Arthritis     GOUT  . Hilar mass     Noted CT 2010  . HTN (hypertension)     Dr. Caryl Comes cardiologist    Past Surgical History  Procedure Laterality Date  . Cholecystectomy    . Cardiac catheterization      he was found to have normal coronary arteries but with a globally dilated and hypocontractile heart  . US echocardiography  03-17-2008, 07-03-2006    EF 15-20%, EF 15-20%  . Transurethral resection of bladder tumor N/A 08/13/2012    Procedure: TRANSURETHRAL RESECTION OF BLADDER TUMOR (TURBT);  Surgeon: Claybon Jabs, MD;  [facility];  Service: Urology;  Laterality: N/A;  MITOMYCIN C  . Icd insertion  04/2012-new ICD  . Endobronchial ultrasound Bilateral 10/01/2012    Procedure: ENDOBRONCHIAL ULTRASOUND;  Surgeon: Collene Gobble, MD;  [facility];  Service: Cardiopulmonary;  Laterality: Bilateral;  . Cornea replacement    . Video bronchoscopy N/A 10/25/2012  Procedure: VIDEO BRONCHOSCOPY;  Surgeon: [name], MD;  Location: [place];  Service: Thoracic;  Laterality: N/A;  . Mediastinoscopy N/A 10/25/2012    Procedure: MEDIASTINOSCOPY;  Surgeon: [name], MD;  Location: [place];  Service: Thoracic;  Laterality: N/A;    No family history on file.  Social History History  Substance Use Topics  . Smoking status: Never Smoker   . Smokeless tobacco: Never Used  . Alcohol Use: No    Current Outpatient Prescriptions  Medication Sig  Dispense Refill  . atorvastatin (LIPITOR) 40 MG tablet Take 40 mg by mouth at bedtime.       . carvedilol (COREG) 25 MG tablet Take 1 tablet (25 mg total) by mouth 2 (two) times daily with a meal.  60 tablet  6  . colchicine 0.6 MG tablet Take 0.6 mg by mouth daily as needed (gout).      Marland Kitchen digoxin (LANOXIN) 0.25 MG tablet Take 0.5 tablets (0.125 mg total) by mouth daily.  15 tablet  6  . febuxostat (ULORIC) 40 MG tablet Take 40 mg by mouth daily.       . furosemide (LASIX) 40 MG tablet Take 1 tablet (40 mg total) by mouth every other day.  17 tablet  6  . levothyroxine (SYNTHROID, LEVOTHROID) 150 MCG tablet Take 150 mcg by mouth daily before breakfast.      . magnesium oxide (MAG-OX) 400 MG tablet Take 400 mg by mouth daily.      Marland Kitchen olmesartan (BENICAR) 20 MG tablet Take 20 mg by mouth daily.      Marland Kitchen OVER THE COUNTER MEDICATION Place 1 drop into both eyes daily as needed (red eyes). Eye drops       No current facility-administered medications for this visit.    Allergies  Allergen Reactions  . Bidil [Isosorb Dinitrate-Hydralazine]     Headaches     Review of Systems noted changes  BP 139/94  Pulse 90  Resp 16  Ht 5\' 11"  (1.803 m)  Wt 250 lb (113.399 kg)  BMI 34.88 kg/m2  SpO2 98% Physical Exam Alert and comfortable HEENT normocephalic, slightly tender in left neck biopsy site but no hematoma Breath sounds are clear Heart rhythm is regular without murmur Neuro intact No peripheral edema  Diagnostic Tests: Results of cervical node biopsy-lymphoid tissue but inadequate tissue for pathologic diagnosis-discussed with patient  Impression: Because of high suspicion of  the anterior mediastinal mass being lymphoma a surgical biopsy under general anesthesia through a small anterior thoracotomy is warranted. If this  demonstrates thyroid tissue then a later surgical resection would probably necessary at a later date , probably requiring sternotomy.  Plan: Repeat  echocardiogram Cardiology clearance for general anesthesia and right VATS procedure and biopsy Surgery scheduled July 6 at [place] [facility]-operation details and risks discussed with patient who understands and agrees.

## 2013-08-28 ENCOUNTER — Other Ambulatory Visit: Payer: Self-pay | Admitting: *Deleted

## 2013-09-02 ENCOUNTER — Encounter (HOSPITAL_COMMUNITY): Payer: Self-pay | Admitting: Pharmacy Technician

## 2013-09-04 ENCOUNTER — Encounter (HOSPITAL_COMMUNITY): Payer: Self-pay

## 2013-09-04 ENCOUNTER — Ambulatory Visit (HOSPITAL_COMMUNITY)
Admission: RE | Admit: 2013-09-04 | Discharge: 2013-09-04 | Disposition: A | Discharge status: Home / Self Care | Payer: 59 | Source: Ambulatory Visit | Attending: Internal Medicine | Admitting: Internal Medicine

## 2013-09-04 ENCOUNTER — Encounter (HOSPITAL_COMMUNITY): Payer: Self-pay | Admitting: *Deleted

## 2013-09-04 VITALS — BP 132/88 | HR 115 | Wt 251.1 lb

## 2013-09-04 DIAGNOSIS — I5022 Chronic systolic (congestive) heart failure: Secondary | ICD-10-CM | POA: Insufficient documentation

## 2013-09-04 DIAGNOSIS — Z01810 Encounter for preprocedural cardiovascular examination: Secondary | ICD-10-CM | POA: Insufficient documentation

## 2013-09-04 DIAGNOSIS — R222 Localized swelling, mass and lump, trunk: Secondary | ICD-10-CM | POA: Insufficient documentation

## 2013-09-04 DIAGNOSIS — J9859 Other diseases of mediastinum, not elsewhere classified: Secondary | ICD-10-CM

## 2013-09-04 NOTE — Patient Instructions (Signed)
Your physician recommends that you schedule a follow-up appointment in: 1 month  

## 2013-09-04 NOTE — Progress Notes (Signed)
Patient ID: JERICO GRISSO, male   DOB: May 02, 1960, 53 y.o.   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 He is intoelrant to numerous HF meds including spiro (severe hyperkalemia - requiring HD), hydralazine (fuzzy-headed) and imdur (severe HAs).   S/P 12/17/12 bladder cancer removal.   Recently found to have enlarging mediastinal mass now with compression of trachea and esophagus. Being followed by Dr. Prescott Gum. Last year a transbronchial EBUS biopsy performed by pulmonary was nondiagnostic. He then had a mediastinoscopy which returned thyroid tissue.  A thyroid scan subtotally showed some activity in the mediastinum and he underwent thyroid suppressive therapy by Dr. Buddy Duty without success and in fact the mass is enlarging. Because of concern this could relieve be a lymphoma PET scan was performed showing hypermetabolic activity as well as hypermetabolic activity of a left cervical lymph node. The lymph node was biopsied by IR and this showed lymphoid tissue but scant sample size, not enough to perform flow cytometry. He is now pending open biopsy via right anterior thoracotomy [name] procedure) under general anesthesia in order to obtain sufficient tissue for diagnosis and lymphoma markers.   [place] 01/28/12  RA = 1  RV = 28/2/4  PA = 32/17 (22)  PCW = 6  Fick cardiac output/index = 5.1/2.2  Thermo cardiac output/index = 5.0/2.1  PVR = 4.0 [place]  FA sat = 98%  PA sat = 64%, 68%   11/29/11 ECHO EF 15% Diffuse hypokinesis. Features are consistent with a pseudonormal left ventricular filling pattern, with concomitant abnormal relaxation and increased filling pressure (grade 2 diastolic  dysfunction).   Admitted to [facility] 01/26/2012 after his ICD fired 5 times Potassium >7.5 Required urgent dialysis. Discharged form [facility] 01/29/12. Discharge weight 250 pounds.   CPX test 12/13  Resting HR: 90 Peak HR: 150 % age predicted max HR: 89% BP rest: 121/88 BP peak: 189/112 Peak VO2: 15.2 ml/kg/min predicted peak VO2: 58.3% (When adjusted to the patient's ideal body weight of 176 lb (79.8 kg) the peak VO2 is 22 ml/kg (ibw)/min (63% of the ibw-adjusted predicted) VE/VCO2 slope: 26.4 OUES: 2.29 Peak RER: 1.05 Ventilatory Threshold: 12.3 ml/kg/min  % predicted peak VO2: 47.2%  CPX test 5/15 FVC 1.55 (37%)  FEV1 1.19 (36%)  FEV1/FVC 77%  MVV 53 (35%)  Exercise Time: 3:30 Speed (mph): 1.5 Grade (%): 0  RPE: 15 Reason stopped: Patient stopped due to dyspnea (5/10) and fatigue Additional symptoms: None reported Resting HR: 109 Peak HR: 141 (84% age predicted max HR) - lost BiV Pacing at 130bpm BP rest: 130/90 BP peak: 142/96 Peak VO2: 9.0 (34.7% predicted peak VO2) VE/VCO2 slope: 32.0 OUES: 1.90 Peak RER: 0.90 Ventilatory Threshold: Could not be detected Peak RR 40 Peak Ventilation: 30.8 VE/MVV: 58.5% PETCO2 at peak: 33 O2pulse: 8 (44% predicted O2pulse)      ECHO 02/25/13 EF <15%   Follow up: As above, he has been seeing Dr. Prescott Gum for work-up of mediastinal mass compressing esophagus and trachea. We are asked to provide pre-op cardiac clearance. He continues to do well. Working full time (works at a computer) but able to walk without limitation. No CP or dyspnea. No edema. Weight stable. No palpitations. No ICD firings. +dysphagia for solids.  Recent CPX as above showed marked chang in functional capacity from 2013.   Labs  12/12/12 dig level 0.9 12/18/12 Magnesium 1.1 K 4.0 Creatinine 1.5 01/02/13: Uric acid 12.9, K+ 3.9, Cr 1.37, AST 21, ALT 19, Mag 1.3 01/29/13 K 4.1 Creatinine 1.5 Magnesium 1.5 03/21/13 Magnesium 1.7 TSH 1.11--> synthorid increased to 100 mcg.    ICD interrogated today: Optivol flat. Activity ~2hrs per day. No VT or AF.   ROS: All systems negative except as listed in HPI, PMH and Problem List.  Past Medical History  Diagnosis Date  . Gout 08/03/12    PT C/O OF RIGHT KNEE PAIN AND SWELLING -STATES GOUT FLARE UP - ONGOING SINCE APRIL - BUT SWELLING W/IN LAST WEEK  . Cardiomyopathy     Idiopathic dilated;   Marland Kitchen Ventricular tachycardia     s/p ICD  . Sleep apnea     CPAP  . Systolic CHF     EF 84%  . CKD (chronic kidney disease)     stage III baseline Crt 1.8-2.0  . Biventricular ICD (implantable cardiac defibrillator) Medtronic ]     DOI 2008/ upgrade 2010/ Gen Change 2014  . Automatic implantable cardioverter-defibrillator in situ   . CHF (congestive heart failure)   . Bladder tumor     PT HOSP AT [facility] 4/24 TO 4/26 2014 WITH UTI--AND FOUND TO HAVE BLADDER TUMOR-  . Arthritis     GOUT  . Hilar mass     Noted CT 2010  . HTN (hypertension)     Dr. Caryl Comes cardiologist    Current Outpatient Prescriptions  Medication Sig Dispense Refill  . atorvastatin (LIPITOR) 40 MG tablet Take 40 mg by mouth at bedtime.       . carvedilol (COREG) 25 MG tablet Take 1 tablet (25 mg total) by mouth 2 (two) times daily with a meal.  60 tablet  6  . colchicine 0.6 MG tablet Take 0.6 mg by mouth daily as needed (gout).      Marland Kitchen digoxin (LANOXIN) 0.25 MG tablet Take 0.5 tablets (0.125 mg total) by mouth daily.  15 tablet  6  . febuxostat (ULORIC) 40 MG tablet Take 40 mg by mouth daily.       . furosemide (LASIX) 40 MG tablet Take 1 tablet (40 mg total) by mouth every other day.  17 tablet  6  . levothyroxine (SYNTHROID, LEVOTHROID) 150 MCG tablet Take 150 mcg by mouth daily before breakfast.      . magnesium oxide (MAG-OX) 400 MG tablet Take 400 mg by mouth daily.      Marland Kitchen olmesartan (BENICAR) 20 MG tablet Take 20 mg by mouth daily.      Marland Kitchen OVER THE COUNTER MEDICATION Place 1 drop into both eyes daily as needed (red eyes). Eye drops       No  current facility-administered medications for this encounter.     Filed Vitals:   09/04/13 1511  BP: 132/88  Pulse: 115  Weight: 251 lb 1.9 oz (113.907 kg)  SpO2: 95%    PHYSICAL EXAM: General:  Well appearing. No resp difficulty HEENT: normal Neck: supple. JVP difficult to assess but appears flat. Carotids 2+ bilaterally; no bruits. No lymphadenopathy or thryomegaly appreciated. R neck scar  Cor: PMI normal. Regular rate & rhythm. No rubs, gallops or murmurs. Lungs: clear Abdomen: obese, soft, nontender, nondistended. No hepatosplenomegaly. No bruits or masses. Good bowel sounds. Extremities: no cyanosis, clubbing, rash, edema Neuro: alert & orientedx3, cranial nerves grossly intact. Moves  all 4 extremities w/o difficulty. Affect pleasant.   ASSESSMENT & PLAN:  1) Chronic systolic HF: NICM, EF 29% (02/2013) s/p Medtronic Optivol  ICD 04/2012 .  - NYHA II symptoms and volume status stable will continue lasix 40 mg QOD.  - On goal dose coreg 25 mg BID. - Continue digoxin 0.125 mg daily and benicar 20 mg daily. Check BMET an dig level today. -He is intoelrant to numerous HF meds including spiro (severe hyperkalemia - requiring HD), hydralazine (fuzzy-headed) and imdur (severe HAs). - His EF has been 10-15% since at least 2008. CPX reassuring.  2) OSA -Continue CPAP nightly 3) Bladder Cancer - Continuing with BCG treatment 4) VT - quiescent. Followed by Dr. Caryl Comes 5) Hypomagnesemia - Will check Mag level today. 6) Enlarging mediastinal mass - pre-operative evaluation - He is having progressive symptoms and we need to find a definitive diagnosis. Despite his markedly depressed EF he has done very well from a HF perspective. However, recent CPX (which was submaximal) shows a marked decline in his FVC and functional capacity from previous CPX in 2013. I suspect this may be relate to compression of his R lung from the tumor. Overall, I think he is at moderate to high, but acceptable,  risk for surgery and given the increasing size of the mass in his chest I feel it is important to proceed with this in the near future. He is scheduled for next Tuesday. We will follow closely.   Total time spent 45 minutes. About half that time spent discussing above the other half of the time spent reviewing all his studies including echo, CT chest and CPX personally.   Glori Bickers MD  3:19 PM

## 2013-09-05 ENCOUNTER — Encounter (HOSPITAL_COMMUNITY): Payer: 59

## 2013-09-06 ENCOUNTER — Encounter (HOSPITAL_COMMUNITY): Payer: 59

## 2013-09-09 ENCOUNTER — Other Ambulatory Visit (HOSPITAL_COMMUNITY): Payer: 59

## 2013-09-09 ENCOUNTER — Ambulatory Visit (HOSPITAL_COMMUNITY)
Admission: RE | Admit: 2013-09-09 | Discharge: 2013-09-09 | Disposition: A | Discharge status: Home / Self Care | Payer: 59 | Source: Ambulatory Visit | Attending: Cardiothoracic Surgery | Admitting: Cardiothoracic Surgery

## 2013-09-09 ENCOUNTER — Encounter (HOSPITAL_COMMUNITY)
Admission: RE | Admit: 2013-09-09 | Discharge: 2013-09-09 | Disposition: A | Discharge status: Home / Self Care | Payer: 59 | Source: Ambulatory Visit | Attending: Cardiothoracic Surgery | Admitting: Cardiothoracic Surgery

## 2013-09-09 ENCOUNTER — Other Ambulatory Visit: Payer: Self-pay

## 2013-09-09 ENCOUNTER — Encounter (HOSPITAL_COMMUNITY): Payer: Self-pay

## 2013-09-09 VITALS — BP 139/86 | HR 93 | Temp 98.4°F | Resp 18 | Ht 70.0 in | Wt 255.0 lb

## 2013-09-09 DIAGNOSIS — J398 Other specified diseases of upper respiratory tract: Secondary | ICD-10-CM

## 2013-09-09 DIAGNOSIS — I379 Nonrheumatic pulmonary valve disorder, unspecified: Secondary | ICD-10-CM

## 2013-09-09 DIAGNOSIS — Z01818 Encounter for other preprocedural examination: Secondary | ICD-10-CM | POA: Insufficient documentation

## 2013-09-09 DIAGNOSIS — Z01812 Encounter for preprocedural laboratory examination: Secondary | ICD-10-CM | POA: Insufficient documentation

## 2013-09-09 HISTORY — DX: Hypothyroidism, unspecified: E03.9

## 2013-09-09 HISTORY — DX: Atherosclerotic heart disease of native coronary artery without angina pectoris: I25.10

## 2013-09-09 LAB — BLOOD GAS, ARTERIAL
Acid-base deficit: 3 mmol/L — ABNORMAL HIGH (ref 0.0–2.0)
Bicarbonate: 20.8 mEq/L (ref 20.0–24.0)
Drawn by: 344381
FIO2: 0.21 %
O2 Saturation: 97.2 %
Patient temperature: 98.6
TCO2: 21.8 mmol/L (ref 0–100)
pCO2 arterial: 33.1 mmHg — ABNORMAL LOW (ref 35.0–45.0)
pH, Arterial: 7.415 (ref 7.350–7.450)
pO2, Arterial: 89 mmHg (ref 80.0–100.0)

## 2013-09-09 LAB — COMPREHENSIVE METABOLIC PANEL
ALT: 28 U/L (ref 0–53)
AST: 29 U/L (ref 0–37)
Albumin: 3.7 g/dL (ref 3.5–5.2)
Alkaline Phosphatase: 80 U/L (ref 39–117)
Anion gap: 19 — ABNORMAL HIGH (ref 5–15)
BUN: 25 mg/dL — ABNORMAL HIGH (ref 6–23)
CO2: 16 mEq/L — ABNORMAL LOW (ref 19–32)
Calcium: 9.4 mg/dL (ref 8.4–10.5)
Chloride: 101 mEq/L (ref 96–112)
Creatinine, Ser: 1.59 mg/dL — ABNORMAL HIGH (ref 0.50–1.35)
GFR calc Af Amer: 55 mL/min — ABNORMAL LOW (ref >90)
GFR calc non Af Amer: 48 mL/min — ABNORMAL LOW (ref >90)
Glucose, Bld: 93 mg/dL (ref 70–99)
Potassium: 5.2 mEq/L (ref 3.7–5.3)
Sodium: 136 mEq/L — ABNORMAL LOW (ref 137–147)
Total Bilirubin: 1 mg/dL (ref 0.3–1.2)
Total Protein: 7.9 g/dL (ref 6.0–8.3)

## 2013-09-09 LAB — URINE MICROSCOPIC-ADD ON

## 2013-09-09 LAB — CBC
HCT: 52.8 % — ABNORMAL HIGH (ref 39.0–52.0)
Hemoglobin: 18.2 g/dL — ABNORMAL HIGH (ref 13.0–17.0)
MCH: 30.1 pg (ref 26.0–34.0)
MCHC: 34.5 g/dL (ref 30.0–36.0)
MCV: 87.3 fL (ref 78.0–100.0)
Platelets: 167 10*3/uL (ref 150–400)
RBC: 6.05 MIL/uL — ABNORMAL HIGH (ref 4.22–5.81)
RDW: 15.1 % (ref 11.5–15.5)
WBC: 10.2 10*3/uL (ref 4.0–10.5)

## 2013-09-09 LAB — URINALYSIS, ROUTINE W REFLEX MICROSCOPIC
Bilirubin Urine: NEGATIVE
Glucose, UA: NEGATIVE mg/dL
Ketones, ur: NEGATIVE mg/dL
Nitrite: NEGATIVE
Protein, ur: >300 mg/dL — AB
Specific Gravity, Urine: 1.018 (ref 1.005–1.030)
Urobilinogen, UA: 0.2 mg/dL (ref 0.0–1.0)
pH: 6 (ref 5.0–8.0)

## 2013-09-09 LAB — APTT: aPTT: 37 seconds (ref 24–37)

## 2013-09-09 LAB — PROTIME-INR
INR: 1.11 (ref 0.00–1.49)
Prothrombin Time: 14.3 seconds (ref 11.6–15.2)

## 2013-09-09 LAB — SURGICAL PCR SCREEN
MRSA, PCR: NEGATIVE
Staphylococcus aureus: NEGATIVE

## 2013-09-09 MED ORDER — DEXTROSE 5 % IV SOLN
1.5000 g | INTRAVENOUS | Status: AC
  Administered 2013-09-10: 1.5 g via INTRAVENOUS
  Filled 2013-09-09: qty 1.5

## 2013-09-09 NOTE — Pre-Procedure Instructions (Signed)
[name]  09/09/2013   Your procedure is scheduled on:  Tuesday July 7 th at 0730 AM  Report to [facility] Admitting at 0530 AM.  Call this number if you have problems the morning of surgery: [phone]   Remember:   Do not eat food or drink liquids after midnight.   Take these medicines the morning of surgery with A SIP OF WATER: Carvedilol, and levothyroxine   Do not wear jewelry .  Do not wear lotions, powders, or perfumes. You may wear deodorant.   Men may shave face and neck.  Do not bring valuables to the hospital.  [facility] is not responsible   for any belongings or valuables.               Contacts, dentures or bridgework may not be worn into surgery.  Leave suitcase in the car. After surgery it may be brought to your room.  For patients admitted to the hospital, discharge time is determined by your treatment team.               Patients discharged the day of surgery will not be allowed to drive home.    Special Instructions: [facility] - Preparing for Surgery  Before surgery, you can play an important role.  Because skin is not sterile, your skin needs to be as free of germs as possible.  You can reduce the number of germs on you skin by washing with CHG (chlorahexidine gluconate) soap before surgery.  CHG is an antiseptic cleaner which kills germs and bonds with the skin to continue killing germs even after washing.  Please DO NOT use if you have an allergy to CHG or antibacterial soaps.  If your skin becomes reddened/irritated stop using the CHG and inform your nurse when you arrive at Short Stay.  Do not shave (including legs and underarms) for at least 48 hours prior to the first CHG shower.  You may shave your face.  Please follow these instructions carefully:   1.  Shower with CHG Soap the night before surgery and the                                morning of Surgery.  2.  If you choose to wash your hair, wash your hair first as usual with  your       normal shampoo.  3.  After you shampoo, rinse your hair and body thoroughly to remove the                      Shampoo.  4.  Use CHG as you would any other liquid soap.  You can apply chg directly       to the skin and wash gently with scrungie or a clean washcloth.  5.  Apply the CHG Soap to your body ONLY FROM THE NECK DOWN.        Do not use on open wounds or open sores.  Avoid contact with your eyes,       ears, mouth and genitals (private parts).  Wash genitals (private parts)       with your normal soap.  6.  Wash thoroughly, paying special attention to the area where your surgery        will be performed.  7.  Thoroughly rinse your body with warm water from the neck down.  8.  DO NOT shower/wash with your normal soap after using and rinsing off       the CHG Soap.  9.  Pat yourself dry with a clean towel.            10.  Wear clean pajamas.            11.  Place clean sheets on your bed the night of your first shower and do not        sleep with pets.  Day of Surgery  Do not apply any lotions/deoderants the morning of surgery.  Please wear clean clothes to the hospital/surgery center.      Please read over the following fact sheets that you were given: Pain Booklet, Coughing and Deep Breathing, Blood Transfusion Information, MRSA Information and Surgical Site Infection Prevention

## 2013-09-10 ENCOUNTER — Encounter: Payer: Self-pay | Admitting: Cardiology

## 2013-09-10 ENCOUNTER — Inpatient Hospital Stay (HOSPITAL_COMMUNITY)
Admission: RE | Admit: 2013-09-10 | Discharge: 2013-09-18 | DRG: 166 | Disposition: A | Discharge status: Home / Self Care | Payer: 59 | Source: Ambulatory Visit | Attending: Cardiothoracic Surgery | Admitting: Cardiothoracic Surgery

## 2013-09-10 ENCOUNTER — Encounter (HOSPITAL_COMMUNITY): Payer: Self-pay | Admitting: *Deleted

## 2013-09-10 ENCOUNTER — Inpatient Hospital Stay (HOSPITAL_COMMUNITY): Payer: 59

## 2013-09-10 ENCOUNTER — Encounter (HOSPITAL_COMMUNITY)
Admission: RE | Disposition: A | Discharge status: Home / Self Care | Payer: Self-pay | Source: Ambulatory Visit | Attending: Cardiothoracic Surgery

## 2013-09-10 ENCOUNTER — Encounter (HOSPITAL_COMMUNITY): Payer: 59 | Admitting: Anesthesiology

## 2013-09-10 ENCOUNTER — Ambulatory Visit (HOSPITAL_COMMUNITY): Payer: 59 | Admitting: Anesthesiology

## 2013-09-10 DIAGNOSIS — E875 Hyperkalemia: Secondary | ICD-10-CM | POA: Diagnosis present

## 2013-09-10 DIAGNOSIS — I2510 Atherosclerotic heart disease of native coronary artery without angina pectoris: Secondary | ICD-10-CM | POA: Diagnosis present

## 2013-09-10 DIAGNOSIS — I2589 Other forms of chronic ischemic heart disease: Secondary | ICD-10-CM | POA: Diagnosis present

## 2013-09-10 DIAGNOSIS — I129 Hypertensive chronic kidney disease with stage 1 through stage 4 chronic kidney disease, or unspecified chronic kidney disease: Secondary | ICD-10-CM | POA: Diagnosis present

## 2013-09-10 DIAGNOSIS — I428 Other cardiomyopathies: Secondary | ICD-10-CM | POA: Diagnosis present

## 2013-09-10 DIAGNOSIS — N170 Acute kidney failure with tubular necrosis: Secondary | ICD-10-CM | POA: Diagnosis not present

## 2013-09-10 DIAGNOSIS — I472 Ventricular tachycardia, unspecified: Secondary | ICD-10-CM | POA: Diagnosis present

## 2013-09-10 DIAGNOSIS — I509 Heart failure, unspecified: Secondary | ICD-10-CM | POA: Diagnosis present

## 2013-09-10 DIAGNOSIS — R222 Localized swelling, mass and lump, trunk: Principal | ICD-10-CM | POA: Diagnosis present

## 2013-09-10 DIAGNOSIS — R918 Other nonspecific abnormal finding of lung field: Secondary | ICD-10-CM

## 2013-09-10 DIAGNOSIS — I5022 Chronic systolic (congestive) heart failure: Secondary | ICD-10-CM | POA: Diagnosis present

## 2013-09-10 DIAGNOSIS — G4733 Obstructive sleep apnea (adult) (pediatric): Secondary | ICD-10-CM | POA: Diagnosis present

## 2013-09-10 DIAGNOSIS — I4729 Other ventricular tachycardia: Secondary | ICD-10-CM | POA: Diagnosis present

## 2013-09-10 DIAGNOSIS — M109 Gout, unspecified: Secondary | ICD-10-CM | POA: Diagnosis present

## 2013-09-10 DIAGNOSIS — N184 Chronic kidney disease, stage 4 (severe): Secondary | ICD-10-CM | POA: Diagnosis present

## 2013-09-10 DIAGNOSIS — E039 Hypothyroidism, unspecified: Secondary | ICD-10-CM | POA: Diagnosis present

## 2013-09-10 DIAGNOSIS — Z95810 Presence of automatic (implantable) cardiac defibrillator: Secondary | ICD-10-CM | POA: Diagnosis not present

## 2013-09-10 DIAGNOSIS — R1310 Dysphagia, unspecified: Secondary | ICD-10-CM | POA: Diagnosis not present

## 2013-09-10 DIAGNOSIS — C679 Malignant neoplasm of bladder, unspecified: Secondary | ICD-10-CM | POA: Diagnosis present

## 2013-09-10 DIAGNOSIS — I5042 Chronic combined systolic (congestive) and diastolic (congestive) heart failure: Secondary | ICD-10-CM

## 2013-09-10 DIAGNOSIS — R0789 Other chest pain: Secondary | ICD-10-CM | POA: Diagnosis present

## 2013-09-10 DIAGNOSIS — J398 Other specified diseases of upper respiratory tract: Secondary | ICD-10-CM

## 2013-09-10 DIAGNOSIS — J9859 Other diseases of mediastinum, not elsewhere classified: Secondary | ICD-10-CM | POA: Diagnosis present

## 2013-09-10 HISTORY — PX: CHEST TUBE INSERTION: SHX231

## 2013-09-10 HISTORY — PX: MEDIASTINOTOMY: SHX5085

## 2013-09-10 HISTORY — PX: MEDIASTINOTOMY CHAMBERLAIN MCNEIL: SHX5966

## 2013-09-10 LAB — BASIC METABOLIC PANEL
Anion gap: 15 (ref 5–15)
BUN: 32 mg/dL — ABNORMAL HIGH (ref 6–23)
CO2: 23 mEq/L (ref 19–32)
Calcium: 8.1 mg/dL — ABNORMAL LOW (ref 8.4–10.5)
Chloride: 100 mEq/L (ref 96–112)
Creatinine, Ser: 2.31 mg/dL — ABNORMAL HIGH (ref 0.50–1.35)
GFR calc Af Amer: 35 mL/min — ABNORMAL LOW (ref >90)
GFR calc non Af Amer: 30 mL/min — ABNORMAL LOW (ref >90)
Glucose, Bld: 123 mg/dL — ABNORMAL HIGH (ref 70–99)
Potassium: 4.4 mEq/L (ref 3.7–5.3)
Sodium: 138 mEq/L (ref 137–147)

## 2013-09-10 LAB — CARBOXYHEMOGLOBIN
Carboxyhemoglobin: 1.5 % (ref 0.5–1.5)
Methemoglobin: 0.9 % (ref 0.0–1.5)
O2 Saturation: 78.6 %
Total hemoglobin: 15.1 g/dL (ref 13.5–18.0)

## 2013-09-10 LAB — BLOOD GAS, ARTERIAL
ACID-BASE DEFICIT: 2.5 mmol/L — AB (ref 0.0–2.0)
Bicarbonate: 22.2 mEq/L (ref 20.0–24.0)
Delivery systems: POSITIVE
Expiratory PAP: 5
FIO2: 40 %
Inspiratory PAP: 10
Mode: POSITIVE
O2 Saturation: 99.9 %
PCO2 ART: 40.7 mmHg (ref 35.0–45.0)
PO2 ART: 182 mmHg — AB (ref 80.0–100.0)
Patient temperature: 98.6
TCO2: 23.4 mmol/L (ref 0–100)
pH, Arterial: 7.355 (ref 7.350–7.450)

## 2013-09-10 LAB — POCT I-STAT 3, ART BLOOD GAS (G3+)
Acid-Base Excess: 1 mmol/L (ref 0.0–2.0)
Bicarbonate: 26.5 mEq/L — ABNORMAL HIGH (ref 20.0–24.0)
O2 Saturation: 99 %
TCO2: 28 mmol/L (ref 0–100)
pCO2 arterial: 42 mmHg (ref 35.0–45.0)
pH, Arterial: 7.407 (ref 7.350–7.450)
pO2, Arterial: 125 mmHg — ABNORMAL HIGH (ref 80.0–100.0)

## 2013-09-10 LAB — CBC
HCT: 48.6 % (ref 39.0–52.0)
HEMOGLOBIN: 16.7 g/dL (ref 13.0–17.0)
MCH: 29.2 pg (ref 26.0–34.0)
MCHC: 34.4 g/dL (ref 30.0–36.0)
MCV: 85 fL (ref 78.0–100.0)
PLATELETS: 158 10*3/uL (ref 150–400)
RBC: 5.72 MIL/uL (ref 4.22–5.81)
RDW: 14.9 % (ref 11.5–15.5)
WBC: 15.3 10*3/uL — ABNORMAL HIGH (ref 4.0–10.5)

## 2013-09-10 LAB — PROTIME-INR
INR: 1.21 (ref 0.00–1.49)
Prothrombin Time: 15.3 seconds — ABNORMAL HIGH (ref 11.6–15.2)

## 2013-09-10 LAB — APTT: aPTT: 38 seconds — ABNORMAL HIGH (ref 24–37)

## 2013-09-10 LAB — PREPARE RBC (CROSSMATCH)

## 2013-09-10 SURGERY — MEDIASTINOTOMY, CHAMBERLAIN
Anesthesia: General | Site: Chest | Laterality: Right

## 2013-09-10 MED ORDER — LIDOCAINE HCL (CARDIAC) 20 MG/ML IV SOLN
INTRAVENOUS | Status: AC
  Filled 2013-09-10: qty 5

## 2013-09-10 MED ORDER — SODIUM BICARBONATE 8.4 % IV SOLN
INTRAVENOUS | Status: DC | PRN
  Administered 2013-09-10: 50 meq via INTRAVENOUS

## 2013-09-10 MED ORDER — ATORVASTATIN CALCIUM 40 MG PO TABS
40.0000 mg | ORAL_TABLET | Freq: Every day | ORAL | Status: DC
  Administered 2013-09-11 – 2013-09-17 (×6): 40 mg via ORAL
  Filled 2013-09-10 (×8): qty 1

## 2013-09-10 MED ORDER — HEMOSTATIC AGENTS (NO CHARGE) OPTIME
TOPICAL | Status: DC | PRN
  Administered 2013-09-10: 1 via TOPICAL

## 2013-09-10 MED ORDER — PHENYLEPHRINE HCL 10 MG/ML IJ SOLN
INTRAMUSCULAR | Status: AC
  Filled 2013-09-10: qty 2

## 2013-09-10 MED ORDER — DOPAMINE-DEXTROSE 3.2-5 MG/ML-% IV SOLN
2.0000 ug/kg/min | INTRAVENOUS | Status: AC
  Administered 2013-09-10: 3 ug/kg/min via INTRAVENOUS
  Filled 2013-09-10: qty 250

## 2013-09-10 MED ORDER — ETOMIDATE 2 MG/ML IV SOLN
INTRAVENOUS | Status: AC
  Filled 2013-09-10: qty 10

## 2013-09-10 MED ORDER — CHLORHEXIDINE GLUCONATE 0.12 % MT SOLN
15.0000 mL | Freq: Two times a day (BID) | OROMUCOSAL | Status: DC
  Administered 2013-09-11 – 2013-09-18 (×14): 15 mL via OROMUCOSAL
  Filled 2013-09-10 (×17): qty 15

## 2013-09-10 MED ORDER — ONDANSETRON HCL 4 MG/2ML IJ SOLN
INTRAMUSCULAR | Status: AC
  Filled 2013-09-10: qty 2

## 2013-09-10 MED ORDER — FEBUXOSTAT 40 MG PO TABS
40.0000 mg | ORAL_TABLET | Freq: Every day | ORAL | Status: DC
  Administered 2013-09-11 – 2013-09-18 (×8): 40 mg via ORAL
  Filled 2013-09-10 (×8): qty 1

## 2013-09-10 MED ORDER — 0.9 % SODIUM CHLORIDE (POUR BTL) OPTIME
TOPICAL | Status: DC | PRN
  Administered 2013-09-10: 1000 mL

## 2013-09-10 MED ORDER — FENTANYL 10 MCG/ML IV SOLN
INTRAVENOUS | Status: DC
  Administered 2013-09-10: 13:00:00 via INTRAVENOUS
  Filled 2013-09-10: qty 50

## 2013-09-10 MED ORDER — ROCURONIUM BROMIDE 50 MG/5ML IV SOLN
INTRAVENOUS | Status: AC
  Filled 2013-09-10: qty 1

## 2013-09-10 MED ORDER — NALOXONE HCL 0.4 MG/ML IJ SOLN: 0.4000 mg | INTRAMUSCULAR | Status: DC | PRN

## 2013-09-10 MED ORDER — OXYCODONE HCL 5 MG/5ML PO SOLN: 5.0000 mg | Freq: Once | ORAL | Status: DC | PRN

## 2013-09-10 MED ORDER — DIPHENHYDRAMINE HCL 50 MG/ML IJ SOLN: 12.5000 mg | Freq: Four times a day (QID) | INTRAMUSCULAR | Status: DC | PRN

## 2013-09-10 MED ORDER — DEXTROSE 5 % IV SOLN
1.5000 g | Freq: Two times a day (BID) | INTRAVENOUS | Status: AC
  Administered 2013-09-10 – 2013-09-11 (×2): 1.5 g via INTRAVENOUS
  Filled 2013-09-10 (×2): qty 1.5

## 2013-09-10 MED ORDER — EPHEDRINE SULFATE 50 MG/ML IJ SOLN
INTRAMUSCULAR | Status: DC | PRN
  Administered 2013-09-10: 10 mg via INTRAVENOUS

## 2013-09-10 MED ORDER — GLYCOPYRROLATE 0.2 MG/ML IJ SOLN
INTRAMUSCULAR | Status: AC
  Filled 2013-09-10: qty 4

## 2013-09-10 MED ORDER — FUROSEMIDE 10 MG/ML IJ SOLN
INTRAMUSCULAR | Status: DC | PRN
  Administered 2013-09-10 (×2): 20 mg via INTRAMUSCULAR

## 2013-09-10 MED ORDER — SUCCINYLCHOLINE CHLORIDE 20 MG/ML IJ SOLN
INTRAMUSCULAR | Status: DC | PRN
  Administered 2013-09-10: 180 mg via INTRAVENOUS

## 2013-09-10 MED ORDER — MIDAZOLAM HCL 5 MG/5ML IJ SOLN
INTRAMUSCULAR | Status: DC | PRN
  Administered 2013-09-10 (×2): 1 mg via INTRAVENOUS

## 2013-09-10 MED ORDER — PHENYLEPHRINE HCL 10 MG/ML IJ SOLN
10.0000 mg | INTRAMUSCULAR | Status: DC | PRN
  Administered 2013-09-10: 10 ug/min via INTRAVENOUS

## 2013-09-10 MED ORDER — PROMETHAZINE HCL 25 MG/ML IJ SOLN: 6.2500 mg | INTRAMUSCULAR | Status: DC | PRN

## 2013-09-10 MED ORDER — TRAMADOL HCL 50 MG PO TABS: 50.0000 mg | ORAL_TABLET | Freq: Four times a day (QID) | ORAL | Status: DC | PRN

## 2013-09-10 MED ORDER — FENTANYL 10 MCG/ML IV SOLN
INTRAVENOUS | Status: DC
  Administered 2013-09-10: 18:00:00 via INTRAVENOUS
  Administered 2013-09-11: 15 ug via INTRAVENOUS
  Administered 2013-09-11: 6 ug via INTRAVENOUS
  Administered 2013-09-11: 45 ug/h via INTRAVENOUS
  Administered 2013-09-12: 15 ug via INTRAVENOUS
  Filled 2013-09-10: qty 50

## 2013-09-10 MED ORDER — ACETAMINOPHEN 500 MG PO TABS
1000.0000 mg | ORAL_TABLET | Freq: Four times a day (QID) | ORAL | Status: AC
  Administered 2013-09-10 – 2013-09-14 (×13): 1000 mg via ORAL
  Filled 2013-09-10 (×17): qty 2

## 2013-09-10 MED ORDER — FUROSEMIDE 10 MG/ML IJ SOLN
INTRAMUSCULAR | Status: AC
  Filled 2013-09-10: qty 2

## 2013-09-10 MED ORDER — DIPHENHYDRAMINE HCL 12.5 MG/5ML PO ELIX
12.5000 mg | ORAL_SOLUTION | Freq: Four times a day (QID) | ORAL | Status: DC | PRN
  Filled 2013-09-10: qty 5

## 2013-09-10 MED ORDER — ALBUTEROL SULFATE HFA 108 (90 BASE) MCG/ACT IN AERS
INHALATION_SPRAY | RESPIRATORY_TRACT | Status: DC | PRN
  Administered 2013-09-10: 10 via RESPIRATORY_TRACT

## 2013-09-10 MED ORDER — FENTANYL CITRATE 0.05 MG/ML IJ SOLN
INTRAMUSCULAR | Status: AC
  Filled 2013-09-10: qty 5

## 2013-09-10 MED ORDER — SENNOSIDES-DOCUSATE SODIUM 8.6-50 MG PO TABS
1.0000 | ORAL_TABLET | Freq: Every day | ORAL | Status: DC
  Administered 2013-09-12 – 2013-09-13 (×2): 1 via ORAL
  Filled 2013-09-10 (×9): qty 1

## 2013-09-10 MED ORDER — ETOMIDATE 2 MG/ML IV SOLN
INTRAVENOUS | Status: DC | PRN
  Administered 2013-09-10: 20 mg via INTRAVENOUS

## 2013-09-10 MED ORDER — COLCHICINE 0.6 MG PO TABS
0.6000 mg | ORAL_TABLET | Freq: Every day | ORAL | Status: DC | PRN
  Administered 2013-09-14 – 2013-09-18 (×5): 0.6 mg via ORAL
  Filled 2013-09-10 (×6): qty 1

## 2013-09-10 MED ORDER — GLYCOPYRROLATE 0.2 MG/ML IJ SOLN
INTRAMUSCULAR | Status: DC | PRN
  Administered 2013-09-10: .8 mg via INTRAVENOUS

## 2013-09-10 MED ORDER — HYDROMORPHONE HCL PF 1 MG/ML IJ SOLN: 0.2500 mg | INTRAMUSCULAR | Status: DC | PRN

## 2013-09-10 MED ORDER — EPHEDRINE SULFATE 50 MG/ML IJ SOLN
INTRAMUSCULAR | Status: AC
  Filled 2013-09-10: qty 1

## 2013-09-10 MED ORDER — LACTATED RINGERS IV SOLN
INTRAVENOUS | Status: DC | PRN
  Administered 2013-09-10 (×2): via INTRAVENOUS

## 2013-09-10 MED ORDER — MIDAZOLAM HCL 2 MG/2ML IJ SOLN
INTRAMUSCULAR | Status: AC
  Filled 2013-09-10: qty 2

## 2013-09-10 MED ORDER — ROCURONIUM BROMIDE 100 MG/10ML IV SOLN
INTRAVENOUS | Status: DC | PRN
  Administered 2013-09-10: 10 mg via INTRAVENOUS
  Administered 2013-09-10: 30 mg via INTRAVENOUS
  Administered 2013-09-10: 10 mg via INTRAVENOUS

## 2013-09-10 MED ORDER — VECURONIUM BROMIDE 10 MG IV SOLR
INTRAVENOUS | Status: DC | PRN
  Administered 2013-09-10 (×5): 2 mg via INTRAVENOUS

## 2013-09-10 MED ORDER — SUCCINYLCHOLINE CHLORIDE 20 MG/ML IJ SOLN
INTRAMUSCULAR | Status: AC
  Filled 2013-09-10: qty 1

## 2013-09-10 MED ORDER — CARVEDILOL 25 MG PO TABS
25.0000 mg | ORAL_TABLET | Freq: Two times a day (BID) | ORAL | Status: DC
  Administered 2013-09-11 – 2013-09-18 (×13): 25 mg via ORAL
  Filled 2013-09-10 (×17): qty 1

## 2013-09-10 MED ORDER — OXYCODONE HCL 5 MG PO TABS
5.0000 mg | ORAL_TABLET | ORAL | Status: DC | PRN
  Administered 2013-09-13 – 2013-09-15 (×2): 5 mg via ORAL
  Filled 2013-09-10: qty 2
  Filled 2013-09-10: qty 1

## 2013-09-10 MED ORDER — ONDANSETRON HCL 4 MG/2ML IJ SOLN: 4.0000 mg | Freq: Four times a day (QID) | INTRAMUSCULAR | Status: DC | PRN

## 2013-09-10 MED ORDER — PROPOFOL 10 MG/ML IV BOLUS
INTRAVENOUS | Status: AC
  Filled 2013-09-10: qty 20

## 2013-09-10 MED ORDER — SODIUM CHLORIDE 0.9 % IJ SOLN: 9.0000 mL | INTRAMUSCULAR | Status: DC | PRN

## 2013-09-10 MED ORDER — SODIUM CHLORIDE 0.9 % IV SOLN
INTRAVENOUS | Status: DC | PRN
  Administered 2013-09-10: 09:00:00 via INTRAVENOUS

## 2013-09-10 MED ORDER — BIOTENE DRY MOUTH MT LIQD
15.0000 mL | Freq: Two times a day (BID) | OROMUCOSAL | Status: DC
  Administered 2013-09-10 – 2013-09-18 (×16): 15 mL via OROMUCOSAL

## 2013-09-10 MED ORDER — FENTANYL CITRATE 0.05 MG/ML IJ SOLN
INTRAMUSCULAR | Status: DC | PRN
  Administered 2013-09-10: 150 ug via INTRAVENOUS
  Administered 2013-09-10: 50 ug via INTRAVENOUS

## 2013-09-10 MED ORDER — MIDAZOLAM HCL 2 MG/2ML IJ SOLN: 0.5000 mg | Freq: Once | INTRAMUSCULAR | Status: DC | PRN

## 2013-09-10 MED ORDER — SODIUM CHLORIDE 0.9 % IV SOLN
Freq: Once | INTRAVENOUS | Status: AC
  Administered 2013-09-10: 08:00:00 via INTRAVENOUS

## 2013-09-10 MED ORDER — ACETAMINOPHEN 160 MG/5ML PO SOLN
1000.0000 mg | Freq: Four times a day (QID) | ORAL | Status: AC
  Administered 2013-09-14 – 2013-09-15 (×2): 1000 mg via ORAL
  Filled 2013-09-10: qty 40

## 2013-09-10 MED ORDER — BISACODYL 5 MG PO TBEC
10.0000 mg | DELAYED_RELEASE_TABLET | Freq: Every day | ORAL | Status: DC
  Administered 2013-09-11 – 2013-09-14 (×3): 10 mg via ORAL
  Filled 2013-09-10 (×3): qty 2

## 2013-09-10 MED ORDER — SODIUM CHLORIDE 0.9 % IJ SOLN
INTRAMUSCULAR | Status: AC
  Filled 2013-09-10: qty 10

## 2013-09-10 MED ORDER — MEPERIDINE HCL 25 MG/ML IJ SOLN: 6.2500 mg | INTRAMUSCULAR | Status: DC | PRN

## 2013-09-10 MED ORDER — BUPIVACAINE 0.5 % ON-Q PUMP SINGLE CATH 400 ML
400.0000 mL | INJECTION | Status: DC
  Filled 2013-09-10: qty 400

## 2013-09-10 MED ORDER — DEXTROSE-NACL 5-0.9 % IV SOLN
INTRAVENOUS | Status: DC
  Administered 2013-09-10 – 2013-09-12 (×3): via INTRAVENOUS

## 2013-09-10 MED ORDER — FUROSEMIDE 10 MG/ML IJ SOLN
20.0000 mg | Freq: Once | INTRAMUSCULAR | Status: AC
  Administered 2013-09-10: 20 mg via INTRAVENOUS
  Filled 2013-09-10: qty 2

## 2013-09-10 MED ORDER — DIGOXIN 125 MCG PO TABS
0.1250 mg | ORAL_TABLET | Freq: Every day | ORAL | Status: DC
  Administered 2013-09-11 – 2013-09-18 (×8): 0.125 mg via ORAL
  Filled 2013-09-10 (×8): qty 1

## 2013-09-10 MED ORDER — LEVOTHYROXINE SODIUM 150 MCG PO TABS
150.0000 ug | ORAL_TABLET | Freq: Every day | ORAL | Status: DC
  Administered 2013-09-11 – 2013-09-18 (×8): 150 ug via ORAL
  Filled 2013-09-10 (×9): qty 1

## 2013-09-10 MED ORDER — ONDANSETRON HCL 4 MG/2ML IJ SOLN
INTRAMUSCULAR | Status: DC | PRN
  Administered 2013-09-10: 4 mg via INTRAVENOUS

## 2013-09-10 MED ORDER — SODIUM CHLORIDE 0.9 % IJ SOLN
9.0000 mL | INTRAMUSCULAR | Status: DC | PRN
Start: 2013-09-10 — End: 2013-09-12

## 2013-09-10 MED ORDER — OXYCODONE HCL 5 MG PO TABS: 5.0000 mg | ORAL_TABLET | Freq: Once | ORAL | Status: DC | PRN

## 2013-09-10 MED ORDER — MAGNESIUM OXIDE 400 MG PO TABS
400.0000 mg | ORAL_TABLET | Freq: Every day | ORAL | Status: DC
  Administered 2013-09-11 – 2013-09-18 (×7): 400 mg via ORAL
  Filled 2013-09-10 (×8): qty 1

## 2013-09-10 MED ORDER — NEOSTIGMINE METHYLSULFATE 10 MG/10ML IV SOLN
INTRAVENOUS | Status: AC
  Filled 2013-09-10: qty 1

## 2013-09-10 MED ORDER — POTASSIUM CHLORIDE 10 MEQ/50ML IV SOLN: 10.0000 meq | Freq: Every day | INTRAVENOUS | Status: DC | PRN

## 2013-09-10 MED ORDER — NEOSTIGMINE METHYLSULFATE 10 MG/10ML IV SOLN
INTRAVENOUS | Status: DC | PRN
  Administered 2013-09-10: 5 mg via INTRAVENOUS

## 2013-09-10 MED ORDER — BUPIVACAINE 0.5 % ON-Q PUMP SINGLE CATH 400 ML
INJECTION | Status: DC | PRN
  Administered 2013-09-10: 400 mL

## 2013-09-10 SURGICAL SUPPLY — 91 items
ADH SKN CLS APL DERMABOND .7 (GAUZE/BANDAGES/DRESSINGS)
BAG DECANTER FOR FLEXI CONT (MISCELLANEOUS) IMPLANT
BANDAGE HEMOSTAT MRDH 4X4 STRL (MISCELLANEOUS) ×1 IMPLANT
BLADE STERNUM SYSTEM 6 (BLADE) ×3 IMPLANT
BLADE SURG 11 STRL SS (BLADE) ×3 IMPLANT
BNDG HEMOSTAT MRDH 4X4 STRL (MISCELLANEOUS) ×3
CANISTER SUCTION 2500CC (MISCELLANEOUS) ×3 IMPLANT
CATH KIT ON Q 5IN SLV (PAIN MANAGEMENT) IMPLANT
CATH ROBINSON RED A/P 22FR (CATHETERS) IMPLANT
CATH THORACIC 28FR (CATHETERS) IMPLANT
CATH THORACIC 36FR (CATHETERS) ×3 IMPLANT
CATH THORACIC 36FR RT ANG (CATHETERS) IMPLANT
CLIP TI MEDIUM 24 (CLIP) ×6 IMPLANT
CLOSURE WOUND 1/2 X4 (GAUZE/BANDAGES/DRESSINGS) ×1
CONT SPEC 4OZ CLIKSEAL STRL BL (MISCELLANEOUS) ×9 IMPLANT
COVER SURGICAL LIGHT HANDLE (MISCELLANEOUS) ×6 IMPLANT
DERMABOND ADVANCED (GAUZE/BANDAGES/DRESSINGS)
DERMABOND ADVANCED .7 DNX12 (GAUZE/BANDAGES/DRESSINGS) IMPLANT
DRAPE CHEST BREAST 15X10 FENES (DRAPES) ×3 IMPLANT
DRAPE LAPAROSCOPIC ABDOMINAL (DRAPES) ×3 IMPLANT
DRAPE WARM FLUID 44X44 (DRAPE) ×3 IMPLANT
DRSG AQUACEL AG ADV 3.5X14 (GAUZE/BANDAGES/DRESSINGS) ×3 IMPLANT
ELECT REM PT RETURN 9FT ADLT (ELECTROSURGICAL) ×3
ELECTRODE REM PT RTRN 9FT ADLT (ELECTROSURGICAL) ×1 IMPLANT
GLOVE BIO SURGEON STRL SZ7.5 (GLOVE) ×6 IMPLANT
GLOVE BIOGEL M 6.5 STRL (GLOVE) ×18 IMPLANT
GLOVE BIOGEL M STRL SZ7.5 (GLOVE) ×9 IMPLANT
GLOVE BIOGEL PI IND STRL 7.0 (GLOVE) ×2 IMPLANT
GLOVE BIOGEL PI INDICATOR 7.0 (GLOVE) ×4
GLOVE ORTHO TXT STRL SZ7.5 (GLOVE) ×6 IMPLANT
GOWN STRL REUS W/ TWL LRG LVL3 (GOWN DISPOSABLE) ×3 IMPLANT
GOWN STRL REUS W/TWL LRG LVL3 (GOWN DISPOSABLE) ×9
HEMOSTAT SURGICEL 2X14 (HEMOSTASIS) ×3 IMPLANT
KIT BASIN OR (CUSTOM PROCEDURE TRAY) ×3 IMPLANT
KIT ROOM TURNOVER OR (KITS) ×3 IMPLANT
KIT SUCTION CATH 14FR (SUCTIONS) ×3 IMPLANT
NEEDLE 22X1 1/2 (OR ONLY) (NEEDLE) ×3 IMPLANT
NS IRRIG 1000ML POUR BTL (IV SOLUTION) ×6 IMPLANT
PACK CHEST (CUSTOM PROCEDURE TRAY) ×3 IMPLANT
PACK GENERAL/GYN (CUSTOM PROCEDURE TRAY) ×3 IMPLANT
PAD ARMBOARD 7.5X6 YLW CONV (MISCELLANEOUS) ×6 IMPLANT
PAIN PUMP ON-Q 400MLX5ML 5IN (MISCELLANEOUS) ×3 IMPLANT
SEALANT SURG COSEAL 4ML (VASCULAR PRODUCTS) IMPLANT
SOLUTION ANTI FOG 6CC (MISCELLANEOUS) ×3 IMPLANT
SPONGE GAUZE 4X4 12PLY (GAUZE/BANDAGES/DRESSINGS) ×3 IMPLANT
SPONGE GAUZE 4X4 12PLY STER LF (GAUZE/BANDAGES/DRESSINGS) ×3 IMPLANT
SPONGE INTESTINAL PEANUT (DISPOSABLE) ×3 IMPLANT
SPONGE LAP 4X18 X RAY DECT (DISPOSABLE) ×3 IMPLANT
SPONGE TONSIL 1.25 RF SGL STRG (GAUZE/BANDAGES/DRESSINGS) ×3 IMPLANT
STAPLER VISISTAT 35W (STAPLE) IMPLANT
STRIP CLOSURE SKIN 1/2X4 (GAUZE/BANDAGES/DRESSINGS) ×2 IMPLANT
SUT CHROMIC 3 0 SH 27 (SUTURE) IMPLANT
SUT ETHILON 3 0 PS 1 (SUTURE) IMPLANT
SUT PROLENE 3 0 SH DA (SUTURE) IMPLANT
SUT PROLENE 4 0 RB 1 (SUTURE) ×6
SUT PROLENE 4-0 RB1 .5 CRCL 36 (SUTURE) ×2 IMPLANT
SUT PROLENE 5 0 C 1 36 (SUTURE) ×12 IMPLANT
SUT SILK  1 MH (SUTURE) ×8
SUT SILK 1 MH (SUTURE) ×4 IMPLANT
SUT SILK 2 0 SH CR/8 (SUTURE) ×3 IMPLANT
SUT SILK 2 0SH CR/8 30 (SUTURE) IMPLANT
SUT SILK 3 0SH CR/8 30 (SUTURE) IMPLANT
SUT STEEL 6MS V (SUTURE) ×3 IMPLANT
SUT VIC AB 1 CTX 18 (SUTURE) ×6 IMPLANT
SUT VIC AB 1 CTX 27 (SUTURE) IMPLANT
SUT VIC AB 2 TP1 27 (SUTURE) IMPLANT
SUT VIC AB 2-0 CT1 27 (SUTURE) ×3
SUT VIC AB 2-0 CT1 TAPERPNT 27 (SUTURE) ×1 IMPLANT
SUT VIC AB 2-0 CT2 18 VCP726D (SUTURE) IMPLANT
SUT VIC AB 2-0 CTX 36 (SUTURE) IMPLANT
SUT VIC AB 3-0 SH 18 (SUTURE) IMPLANT
SUT VIC AB 3-0 SH 27 (SUTURE)
SUT VIC AB 3-0 SH 27XBRD (SUTURE) IMPLANT
SUT VIC AB 3-0 X1 27 (SUTURE) IMPLANT
SUT VICRYL 0 UR6 27IN ABS (SUTURE) IMPLANT
SUT VICRYL 2 TP 1 (SUTURE) ×3 IMPLANT
SWAB COLLECTION DEVICE MRSA (MISCELLANEOUS) IMPLANT
SYR CONTROL 10ML LL (SYRINGE) ×3 IMPLANT
SYRINGE 10CC LL (SYRINGE) ×3 IMPLANT
SYSTEM SAHARA CHEST DRAIN ATS (WOUND CARE) ×3 IMPLANT
SYSTEM SAHARA CHEST DRAIN RE-I (WOUND CARE) ×3 IMPLANT
TAPE CLOTH SURG 4X10 WHT LF (GAUZE/BANDAGES/DRESSINGS) ×3 IMPLANT
TIP APPLICATOR SPRAY EXTEND 16 (VASCULAR PRODUCTS) IMPLANT
TOWEL OR 17X24 6PK STRL BLUE (TOWEL DISPOSABLE) ×6 IMPLANT
TOWEL OR 17X26 10 PK STRL BLUE (TOWEL DISPOSABLE) ×6 IMPLANT
TRAP SPECIMEN MUCOUS 40CC (MISCELLANEOUS) ×3 IMPLANT
TRAY FOLEY CATH 16FRSI W/METER (SET/KITS/TRAYS/PACK) ×3 IMPLANT
TROCAR XCEL BLUNT TIP 100MML (ENDOMECHANICALS) ×3 IMPLANT
TUBE ANAEROBIC SPECIMEN COL (MISCELLANEOUS) IMPLANT
TUNNELER SHEATH ON-Q 11GX8 (MISCELLANEOUS) ×3 IMPLANT
WATER STERILE IRR 1000ML POUR (IV SOLUTION) ×6 IMPLANT

## 2013-09-10 NOTE — Progress Notes (Signed)
The patient was examined and preop studies reviewed. There has been no change from the prior exam and the patient is ready for surgery.  plan R chamberlain- mediastinotomy today on [address] for bxof paratracheal mass

## 2013-09-10 NOTE — Anesthesia Procedure Notes (Addendum)
Procedure Name: Intubation Date/Time: 09/10/2013 8:07 AM Performed by: Rush Farmer E Pre-anesthesia Checklist: Patient identified, Emergency Drugs available, Suction available, Patient being monitored and Timeout performed Patient Re-evaluated:Patient Re-evaluated prior to inductionOxygen Delivery Method: Circle system utilized Preoxygenation: Pre-oxygenation with 100% oxygen Intubation Type: IV induction Ventilation: Mask ventilation without difficulty and Oral airway inserted - appropriate to patient size Endobronchial tube: Left, EBT position confirmed by fiberoptic bronchoscope, Double lumen EBT and EBT position confirmed by auscultation and 37 Fr Number of attempts: 1 Airway Equipment and Method: Video-laryngoscopy and Stylet Placement Confirmation: ETT inserted through vocal cords under direct vision,  positive ETCO2 and breath sounds checked- equal and bilateral Tube secured with: Tape Dental Injury: Teeth and Oropharynx as per pre-operative assessment  Comments: DLT intubation per Jenness Corner, SRNA with Dr. Eduard Roux assistance. +ETCO & BBS=.

## 2013-09-10 NOTE — Anesthesia Postprocedure Evaluation (Signed)
  Anesthesia Post-op Note  Patient: [name]  Procedure(s) Performed: Procedure(s): MEDIASTINOTOMY CHAMBERLAIN MCNEIL (Right)  Patient Location: PACU  Anesthesia Type:General  Level of Consciousness: awake, alert , oriented and patient cooperative  Airway and Oxygen Therapy: Patient Spontanous Breathing and non-rebreather face mask  Post-op Pain: none  Post-op Assessment: Post-op Vital signs reviewed, Patient's Cardiovascular Status Stable, Patent Airway, No signs of Nausea or vomiting and Pain level controlled, stable on BiPAP  Post-op Vital Signs: Reviewed and stable  Last Vitals:  Filed Vitals:   09/10/13 1340  BP:   Pulse:   Temp: 37.5 C  Resp:     Complications: No apparent anesthesia complications

## 2013-09-10 NOTE — Progress Notes (Signed)
[employer] rep Tomi Bamberger called and informed of surgery this am at 0730. States that she was aware.

## 2013-09-10 NOTE — Brief Op Note (Signed)
09/10/2013  11:19 AM  PATIENT:  [name]  53 y.o. male  PRE-OPERATIVE DIAGNOSIS:  PARATRACHEAL MASS  POST-OPERATIVE DIAGNOSIS:  PARATRACHEAL MASS/ VASCULAR MALFORMATION  PROCEDURE:  Procedure(s): MEDIASTINOTOMY CHAMBERLAIN MCNEIL PLACEMENT OF ON-Q  SURGEON:  Surgeon(s): [name], MD Grace Isaac, MD  PHYSICIAN ASSISTANT: Jamare Thelmer Legler PA-C  ANESTHESIA:   general  SPECIMEN:  Source of Specimen:  TISSUE ASSOCIATED WITH MASS  DISPOSITION OF SPECIMEN:  Pathology  DRAINS: 1 Chest Tube(s) in the RIGHT CHEST   PATIENT CONDITION:  PACU - hemodynamically stable.  PRE-OPERATIVE WEIGHT: 115kg  FROZEN: NEG FOR MALIGNANCY

## 2013-09-10 NOTE — Anesthesia Preprocedure Evaluation (Addendum)
Anesthesia Evaluation  Patient identified by MRN, date of birth, ID band Patient awake    Reviewed: Allergy & Precautions, H&P , NPO status , Patient's Chart, lab work & pertinent test results  History of Anesthesia Complications Negative for: history of anesthetic complications  Airway Mallampati: IV TM Distance: <3 FB Neck ROM: Limited  Mouth opening: Limited Mouth Opening  Dental  (+) Teeth Intact, Dental Advisory Given   Pulmonary sleep apnea and Continuous Positive Airway Pressure Ventilation ,  breath sounds clear to auscultation        Cardiovascular hypertension, Pt. on medications and Pt. on home beta blockers + CAD and +CHF (EF 10-15%) + dysrhythmias Ventricular Tachycardia + pacemaker (BiV pacemaker) + Cardiac Defibrillator Rhythm:Regular Rate:Normal     Neuro/Psych negative neurological ROS     GI/Hepatic negative GI ROS, Neg liver ROS,   Endo/Other  Hypothyroidism Morbid obesity  Renal/GU Renal InsufficiencyRenal disease (creat 1.59)     Musculoskeletal   Abdominal (+) + obese,   Peds  Hematology negative hematology ROS (+)   Anesthesia Other Findings   Reproductive/Obstetrics                         Anesthesia Physical Anesthesia Plan  ASA: IV  Anesthesia Plan: General   Post-op Pain Management:    Induction: Intravenous  Airway Management Planned: Double Lumen EBT and Video Laryngoscope Planned  Additional Equipment: Arterial line  Intra-op Plan:   Post-operative Plan: Possible Post-op intubation/ventilation  Informed Consent: I have reviewed the patients History and Physical, chart, labs and discussed the procedure including the risks, benefits and alternatives for the proposed anesthesia with the patient or authorized representative who has indicated his/her understanding and acceptance.   Dental advisory given  Plan Discussed with: CRNA and Surgeon  Anesthesia  Plan Comments: (Plan routine monitors, A line, GETA with VideoGlide intubation with DLT)        Anesthesia Quick Evaluation

## 2013-09-10 NOTE — Progress Notes (Signed)
ABG ran at 1545, per RT patient was ok to come off of bipap and onto [place]. Per patient request, the bipap remains on for comfort. Will continue to monitor. Richardean Sale, RN

## 2013-09-10 NOTE — Progress Notes (Signed)
RT placed pt on bipap.  10/5, 40% rate 12

## 2013-09-10 NOTE — Transfer of Care (Signed)
Immediate Anesthesia Transfer of Care Note  Patient: [name]  Procedure(s) Performed: Procedure(s): MEDIASTINOTOMY CHAMBERLAIN MCNEIL (Right)  Patient Location: PACU  Anesthesia Type:General  Level of Consciousness: awake, alert  and oriented  Airway & Oxygen Therapy: Patient Spontanous Breathing and Patient connected to face mask  Post-op Assessment: Report given to PACU RN, Post -op Vital signs reviewed and stable and Patient moving all extremities X 4  Post vital signs: Reviewed and stable  Complications: No apparent anesthesia complications

## 2013-09-10 NOTE — Progress Notes (Signed)
Patient ID: [name], male   DOB: 1960-05-01, 53 y.o.   MRN: [id number]  SICU Evening Rounds:  Hemodynamically stable  No pain  sats 98% on 4L [place]  Urine output good CT output low.

## 2013-09-11 ENCOUNTER — Inpatient Hospital Stay (HOSPITAL_COMMUNITY): Payer: 59

## 2013-09-11 ENCOUNTER — Encounter (HOSPITAL_COMMUNITY): Payer: Self-pay | Admitting: Cardiothoracic Surgery

## 2013-09-11 DIAGNOSIS — R222 Localized swelling, mass and lump, trunk: Principal | ICD-10-CM

## 2013-09-11 DIAGNOSIS — I5042 Chronic combined systolic (congestive) and diastolic (congestive) heart failure: Secondary | ICD-10-CM

## 2013-09-11 LAB — POCT I-STAT 3, ART BLOOD GAS (G3+)
Acid-base deficit: 2 mmol/L (ref 0.0–2.0)
Bicarbonate: 23.3 mEq/L (ref 20.0–24.0)
O2 Saturation: 99 %
Patient temperature: 99.2
TCO2: 25 mmol/L (ref 0–100)
pCO2 arterial: 41 mmHg (ref 35.0–45.0)
pH, Arterial: 7.364 (ref 7.350–7.450)
pO2, Arterial: 121 mmHg — ABNORMAL HIGH (ref 80.0–100.0)

## 2013-09-11 LAB — TYPE AND SCREEN
ABO/RH(D): A POS
Antibody Screen: NEGATIVE
Unit division: 0
Unit division: 0

## 2013-09-11 LAB — CBC
HEMATOCRIT: 44.8 % (ref 39.0–52.0)
Hemoglobin: 15.1 g/dL (ref 13.0–17.0)
MCH: 28.8 pg (ref 26.0–34.0)
MCHC: 33.7 g/dL (ref 30.0–36.0)
MCV: 85.5 fL (ref 78.0–100.0)
Platelets: 155 10*3/uL (ref 150–400)
RBC: 5.24 MIL/uL (ref 4.22–5.81)
RDW: 14.9 % (ref 11.5–15.5)
WBC: 18.1 10*3/uL — ABNORMAL HIGH (ref 4.0–10.5)

## 2013-09-11 LAB — BASIC METABOLIC PANEL
Anion gap: 15 (ref 5–15)
Anion gap: 14 (ref 5–15)
BUN: 35 mg/dL — AB (ref 6–23)
BUN: 36 mg/dL — ABNORMAL HIGH (ref 6–23)
CALCIUM: 8.3 mg/dL — AB (ref 8.4–10.5)
CO2: 20 mEq/L (ref 19–32)
CO2: 24 mEq/L (ref 19–32)
CREATININE: 2.48 mg/dL — AB (ref 0.50–1.35)
Calcium: 8.2 mg/dL — ABNORMAL LOW (ref 8.4–10.5)
Chloride: 100 mEq/L (ref 96–112)
Chloride: 98 mEq/L (ref 96–112)
Creatinine, Ser: 2.66 mg/dL — ABNORMAL HIGH (ref 0.50–1.35)
GFR calc Af Amer: 32 mL/min — ABNORMAL LOW (ref >90)
GFR calc Af Amer: 30 mL/min — ABNORMAL LOW (ref >90)
GFR calc non Af Amer: 28 mL/min — ABNORMAL LOW (ref >90)
GFR calc non Af Amer: 26 mL/min — ABNORMAL LOW (ref >90)
GLUCOSE: 137 mg/dL — AB (ref 70–99)
Glucose, Bld: 134 mg/dL — ABNORMAL HIGH (ref 70–99)
Potassium: 6 mEq/L — ABNORMAL HIGH (ref 3.7–5.3)
Potassium: 4.4 mEq/L (ref 3.7–5.3)
Sodium: 135 mEq/L — ABNORMAL LOW (ref 137–147)
Sodium: 136 mEq/L — ABNORMAL LOW (ref 137–147)

## 2013-09-11 LAB — CARBOXYHEMOGLOBIN
Carboxyhemoglobin: 1.4 % (ref 0.5–1.5)
Methemoglobin: 0.7 % (ref 0.0–1.5)
O2 Saturation: 65.8 %
Total hemoglobin: 15.8 g/dL (ref 13.5–18.0)

## 2013-09-11 LAB — ANGIOTENSIN CONVERTING ENZYME: Angiotensin-Converting Enzyme: 26 U/L (ref 8–52)

## 2013-09-11 MED ORDER — FUROSEMIDE 10 MG/ML IJ SOLN
40.0000 mg | Freq: Every day | INTRAMUSCULAR | Status: DC
  Administered 2013-09-11: 40 mg via INTRAVENOUS
  Filled 2013-09-11 (×2): qty 4

## 2013-09-11 MED ORDER — ENOXAPARIN SODIUM 30 MG/0.3ML ~~LOC~~ SOLN
30.0000 mg | SUBCUTANEOUS | Status: DC
  Administered 2013-09-11 – 2013-09-18 (×8): 30 mg via SUBCUTANEOUS
  Filled 2013-09-11 (×8): qty 0.3

## 2013-09-11 MED ORDER — GABAPENTIN 100 MG PO CAPS
200.0000 mg | ORAL_CAPSULE | Freq: Two times a day (BID) | ORAL | Status: DC
  Administered 2013-09-11 – 2013-09-14 (×7): 200 mg via ORAL
  Filled 2013-09-11 (×16): qty 2

## 2013-09-11 MED ORDER — SODIUM POLYSTYRENE SULFONATE 15 GM/60ML PO SUSP
30.0000 g | Freq: Once | ORAL | Status: AC
  Administered 2013-09-11: 30 g via ORAL
  Filled 2013-09-11: qty 120

## 2013-09-11 NOTE — Op Note (Signed)
NAMEMarland [name]  [name], [name]:  [id number]  MEDICAL RECORD NO.:  [id number]  LOCATION:  2S02C                        FACILITY:  [facility]  PHYSICIAN:  [name], M.D.  DATE OF BIRTH:  04-19-1960  DATE OF PROCEDURE:  09/10/2013 DATE OF DISCHARGE:                              OPERATIVE REPORT   OPERATION:  Right anterior mediastinotomy [name] procedure) and biopsy of paratracheal mass.  PREOPERATIVE DIAGNOSIS:  Right paratracheal mass.  POSTOPERATIVE DIAGNOSIS:  Vascular malformation of the mediastinum.  SURGEON:  [name], M.D.  ASSISTANT:  [name], MD and [name], PA-C  ANESTHESIA:  General by [name], M.D.  CLINICAL NOTE:  The patient is a 53 year old male, who has had a slowly enlarging right paratracheal mass since 2008.  He also has had progressive idiopathic cardiomyopathy and advanced heart failure. Follow up in the [facility].  Previous CT scans since 2008, had documented a slow progression in size of this mass.  In 2014, he underwent a bronchoscopy and EBUS by Dr. [name].  A EBUS biopsy of the paratracheal mass was inconclusive.  He then underwent mediastinoscopy and biopsies returned tissue consistent with normal thyroid tissue.  A subsequent thyroid scan demonstrates some metabolic activity  or thyroid activity in the mediastinum and he was placed on suppressive thyroid medication.  However, on suppressive thyroid medication by his endocrinologist, the mass continued to increase in size.  At that point, a PET scan was performed and showed this to be hypermetabolic. Interventional Radiology was asked to perform a biopsy.  They felt that would be safer to biopsy a cervical lymph node, which showed up on PET scan and this was inconclusive.  There was suspicion this was a slowly growing lymphoma.  I recommended to the patient he undergo a right Chamberlain procedure for biopsy to obtain enough tissue for  adequate markers if this was a lymphoma.  The patient was seen by his cardiologist prior to the procedure and felt to be at moderate risk, but this felt the biopsy was needed in order to direct proper therapy.  Prior to surgery, I examined the patient in the office and reviewed the results of his previous PET and CT scans.  I discussed the details of surgery including the use of general anesthesia, the location of the surgical incision, and the expected postoperative recovery.  I reviewed the risks of bleeding, postoperative cardiac problems requiring inotropic or mechanical support, postoperative infection, postoperative renal failure, and death.  After reviewing these issues, he demonstrated his understanding and agreed to proceed with surgery under what I felt was an informed consent.  OPERATIVE PROCEDURE:  The patient was brought to the operating room and placed supine on the operating room table, where general anesthesia was induced.  A double-lumen endotracheal tube was placed by the anesthesiologist.  The patient's chest and abdomen were prepped and draped as a sterile field.  A proper time-out was performed.  An incision was made in the right third anterior interspace.  The pleural space was entered.  The internal thoracic vessels were identified and preserved.  The right lung was collapsed.  There was bright mediastinal fullness. The AICD  lead in the superior vena cava was visible and palpable.  The fullness extended up to the thoracic inlet and posterior to the hilum. Mediastinal fat was dissected off the fullness.  The surface of the mass had lots of blood vessels covering it.  Incisional biopsy with the 15 blade was made lateral to the SVC line.  Venous blood immediately drained through the incision.  This was repaired with a 5-0 Prolene pledgeted suture.  The incision was extended laterally.  Dr. [name] presented to assist with evaluation and completing the  procedure.  Using the thoracoscope and direct vision, photographs were taken of the hypervascular mass.  A second area was dissected and exposed more posterior and superior.  It was an area of nodularity and slightly different texture.  Before making an incisional biopsy in this area, a 22-gauge needle was used and immediately venous blood under pressure drained from the needlestick from the second.  This again was repaired with a 5-0 Prolene pledgeted suture.  The second biopsy was made several cm deep and also more superiorly than the first biopsy.  It was felt the peritracheal mass represented a hypervascular malformation, nonmalignant.  Some of the mediastinal tissue was sent for both frozen and permanent histology. The lung was inspected and found to be fairly normal.  Because of the patient's EF of 15% with advanced heart failure and low oxygen consumption by CPX testing, resection of this was not possible. A chest tube was placed and directed to the apex and secured to the skin.  A portion of the third rib had been removed to help with exposure.  The operative field was checked for hemostasis.  The lung was re-expanded under direct vision.  During the procedure on several occasions, the patient desaturated on single lung ventilation and the right lung had to be ventilated which prolonged the course of the operation and increased its difficulty by the subsequent problems with exposure.  The incision was then closed in layers.  A heavy gauge Vicryl for pericostal suture was attempted, but not possible due to the space constraints.  The pectoralis muscle was closed with interrupted #1 Vicryl.  The subcutaneous was closed in running 2-0 Vicryl and skin was closed with a subcuticular.  A sterile dressing was applied.  A ON-Q wound pain analgesia system was placed and secured to a reservoir of 0.5% Marcaine.  This was secured to the skin with a silk suture.  The chest tube was  connected to an underwater seal Pleur-Evac drainage system and secured.  The patient was then extubated in the operating room, observed for a significant period of time before returning to the ICU by the anesthesia team.     [name], M.D.     PV/MEDQ  D:  09/10/2013  T:  09/11/2013  Job:  [id number]  cc:   Shaune Pascal. Bensimhon, MD Katina Degree, M.D.

## 2013-09-11 NOTE — Progress Notes (Signed)
Pt. Was placed on his home CPAP via his FFM from home with 2L O2 bled in. Pt. Is tolerating CPAP well at this time without any complications.

## 2013-09-11 NOTE — Progress Notes (Signed)
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  He is intoelrant to numerous HF meds including spiro (severe hyperkalemia - requiring HD), hydralazine (fuzzy-headed) and imdur (severe HAs).   S/P 12/17/12 bladder cancer removal.   Recently found to have enlarging mediastinal mass now with compression of trachea and esophagus. Last year a transbronchial EBUS biopsy performed by pulmonary was nondiagnostic. He then had a mediastinoscopy which returned thyroid tissue. A thyroid scan subtotally showed some activity in the mediastinum and he underwent thyroid suppressive therapy by Dr. Buddy Duty without success and in fact the mass is enlarging. Because of concern this could relieve be a lymphoma PET scan was performed showing hypermetabolic activity as well as hypermetabolic activity of a left cervical lymph node. The lymph node was biopsied by IR and this showed lymphoid tissue but scant sample size, not enough to perform flow cytometry.   Admitted and underwent R chanberlain- bx mediastinal mass- c/w vascular malformation. Renal function up as well as K+ Co-ox 66%. Denies SOB or CP. + numbness in fingers.   Objective:   Weight Range:  Vital Signs:   Temp:  [98.6 F (37 C)-99.8 F (37.7 C)] 99.3 F (37.4 C) (07/08 1200) Pulse Rate:  [86-95] 90 (07/08 1400) Resp:  [0-31] 0 (07/08 1400) BP: (86-128)/(13-84) 86/13 mmHg (07/08 1400) SpO2:  [96 %-100 %] 98 % (07/08 1400) Arterial Line BP: (84-131)/(56-82) 104/74 mmHg (07/08 0900) FiO2 (%):  [40 %] 40 % (07/08 0000) Weight:  [254 lb 3.1 oz (115.3 kg)] 254 lb 3.1 oz (115.3 kg) (07/08 0600)    Weight change: Filed  Weights   09/10/13 0545 09/11/13 0600  Weight: 255 lb (115.667 kg) 254 lb 3.1 oz (115.3 kg)    Intake/Output:   Intake/Output Summary (Last 24 hours) at 09/11/13 1511 Last data filed at 09/11/13 1200  Gross per 24 hour  Intake   1100 ml  Output   1395 ml  Net   -295 ml     Physical Exam: General:  Well appearing. No resp difficulty HEENT: normal Neck: supple. JVP flat; RIJ 2L central line; Carotids 2+ bilat; no bruits. No lymphadenopathy or thryomegaly appreciated. Cor: PMI nondisplaced. Regular rate & rhythm. No rubs, gallops or murmurs. Lungs: clear Abdomen: soft, nontender, nondistended. No hepatosplenomegaly. No bruits or masses. Good bowel sounds. Extremities: no cyanosis, clubbing, rash, edema Neuro: alert & orientedx3, cranial nerves grossly intact. moves all 4 extremities w/o difficulty. Affect pleasant  Telemetry: SR  Labs: Basic Metabolic Panel:  Recent Labs Lab 09/09/13 1504 09/10/13 1749 09/11/13 0433  NA 136* 138 135*  K 5.2 4.4 6.0*  CL 101 100 100  CO2 16* 23 20  GLUCOSE 93 123* 137*  BUN 25* 32* 35*  CREATININE 1.59* 2.31* 2.48*  CALCIUM 9.4 8.1* 8.3*    Liver Function Tests:  Recent Labs Lab 09/09/13 1504  AST 29  ALT 28  ALKPHOS 80  BILITOT 1.0  PROT 7.9  ALBUMIN 3.7   No results found for this basename: LIPASE, AMYLASE,  in the last 168 hours No results found for this basename: AMMONIA,  in the last 168 hours  CBC:  Recent Labs Lab 09/09/13 1504 09/10/13 0542 09/11/13 0433  WBC 10.2 15.3* 18.1*  HGB 18.2* 16.7 15.1  HCT 52.8* 48.6 44.8  MCV 87.3 85.0 85.5  PLT 167 158 155    Cardiac Enzymes: No results found for this basename: CKTOTAL, CKMB, CKMBINDEX, TROPONINI,  in the last 168 hours  BNP: BNP (last 3 results) No results found for this basename: PROBNP,  in the last 8760 hours   Imaging: Chest 2 View  09/09/2013   CLINICAL DATA:  Preop evaluation  EXAM: CHEST  2 VIEW  COMPARISON:  Chest CT dated 08/27/2013,  two-view chest 10/26/2012  FINDINGS: Stable cardiomegaly. Left chest wall AICD. Stable persistent right paratracheal mass stable. Stable compression upon the trachea. Lungs are clear. No acute osseous abnormalities. Osteoarthritic changes in the shoulders.  IMPRESSION: Stable right peritracheal mass. No acute cardiopulmonary disease. Stable cardiomegaly.   Electronically Signed   By: Margaree Mackintosh M.D.   On: 09/09/2013 15:29   Dg Chest Port 1 View  09/11/2013   CLINICAL DATA:  Status post mediastinotomy ; peritracheal mass.  EXAM: PORTABLE CHEST - 1 VIEW  COMPARISON:  September 10, 2013.  FINDINGS: Stable cardiomegaly. Right-sided chest tube is unchanged in position without pneumothorax. Right peritracheal mass is again noted. Right internal jugular catheter line is unchanged with distal tip in expected position of the SVC. Left-sided pacemaker is unchanged.  IMPRESSION: No change in right-sided chest tube without evidence of pneumothorax. Right peritracheal mass is again noted and unchanged.   Electronically Signed   By: Sabino Dick M.D.   On: 09/11/2013 08:07   Dg Chest Port 1 View  09/10/2013   CLINICAL DATA:  Post median sternotomy, chest tube, his past history CHF, AICD, hypertension, coronary artery disease  EXAM: PORTABLE CHEST - 1 VIEW  COMPARISON:  Portable exam 1303 hr compared to 09/10/2013  FINDINGS: RIGHT jugular line stable tip projecting over SVC.  LEFT subclavian AICD leads project over RIGHT atrium and RIGHT ventricle.  RIGHT thoracostomy tube again noted.  Enlargement of cardiac silhouette.  Persistent RIGHT superior mediastinal mass.  Minimal patchy infiltrates in RIGHT perihilar region with questionable atelectasis versus consolidation in LEFT lower lobe.  No gross pleural effusion or pneumothorax.  IMPRESSION: Improved pulmonary infiltrates.  Resolution of pneumothorax seen on previous exam.   Electronically Signed   By: Lavonia Dana M.D.   On: 09/10/2013 13:17   Dg Chest Portable 1  View  09/10/2013   CLINICAL DATA:  OR instrument check.  EXAM: PORTABLE CHEST - 1 VIEW  COMPARISON:  09/09/2013.  FINDINGS: Post right lung surgery with metallic structures projecting over the right upper lung zone having an appearance of surgical clips without curvilinear structure to suggest the presence of retained needle or surgical instrument.  Right chest tube in place. Question tiny lateral and medial right-sided pneumothorax.  Hazy appearance of the right mid-lower lung zone may represent asymmetric pulmonary edema. Hemorrhage from recent lung surgery not entirely excluded contributing to right upper lobe changes.  Prominent cardiomegaly. Sequential pacemaker/AICD is in place. Prominent kink of 1 of the ventricular leads unchanged.  Right central line has been placed. The tip projects over the right lung apex. Exact position indeterminate. Correlation with blood return recommended.  Large right paratracheal mass appears slightly more prominent which may be related to AP portable magnification.  IMPRESSION: No evidence of retained surgical instrument.  Right chest tube in place. There may be a tiny medial/lateral right sided pneumothorax.  Hazy appearance of the right mid-lower lung zone may represent asymmetric pulmonary edema. Hemorrhage  from recent lung surgery not entirely excluded contributing to right upper lobe changes.  Prominent cardiomegaly. Sequential pacemaker/AICD is in place. Prominent kink of 1 of the ventricular leads unchanged.  Right central line has been placed. The tip projects over the right lung apex. Exact position indeterminate. Correlation with blood return recommended.  Large right paratracheal mass appears slightly more prominent which may be related to AP portable magnification.  These results will be called to the ordering clinician or representative by the Radiologist Assistant, and communication documented in the PACS or zVision Dashboard.   Electronically Signed   By: Chauncey Cruel M.D.   On: 09/10/2013 11:45      Medications:     Scheduled Medications: . acetaminophen  1,000 mg Oral 4 times per day   Or  . acetaminophen (TYLENOL) oral liquid 160 mg/5 mL  1,000 mg Oral 4 times per day  . antiseptic oral rinse  15 mL Mouth Rinse q12n4p  . atorvastatin  40 mg Oral QHS  . bisacodyl  10 mg Oral Daily  . carvedilol  25 mg Oral BID WC  . chlorhexidine  15 mL Mouth Rinse BID  . digoxin  0.125 mg Oral Daily  . enoxaparin (LOVENOX) injection  30 mg Subcutaneous Q24H  . febuxostat  40 mg Oral Daily  . fentaNYL   Intravenous 6 times per day  . furosemide  40 mg Intravenous Daily  . gabapentin  200 mg Oral BID  . levothyroxine  150 mcg Oral QAC breakfast  . magnesium oxide  400 mg Oral Daily  . senna-docusate  1 tablet Oral QHS     Infusions: . bupivacaine 0.5 % ON-Q pump SINGLE CATH 400 mL    . dextrose 5 % and 0.9% NaCl 50 mL/hr at 09/10/13 1700     PRN Medications:  colchicine, diphenhydrAMINE, diphenhydrAMINE, naloxone, ondansetron (ZOFRAN) IV, oxyCODONE, sodium chloride   Assessment:   1) Chronic systolic HF - EF 51% (0/2585) 2) NICM - s/p Medtronic ICD 3) Mediastinal mass c/w vascular malformation 4) Acute on chronic kidney disease, stage IV 5) Bladder Cancer  Plan/Discussion:    Mr. Kimball is well known to the HF team and was admitted to undergo biopsy of mediastinal mass. The biopsy showed benign fibroadipose tissue. The main issue now is what to do about the mass. Will need to discuss with PVT and IR to see if there is anyway we could coil, lyse etc the vascular malformation.  Kidney function is elevated. Co-ox stable will not start milrinone at this point. Will continue to follow.  Volume status stable, will continue to hold diuretics.   Length of Stay: 1 Rande Brunt NP-C 09/11/2013, 3:11 PM  Advanced Heart Failure Team Pager [phone] (M-F; 7a - 4p)  Please contact [facility] for night-coverage after hours (4p -7a )  and weekends on [email or web address]  Patient seen and examined with Junie Bame, NP. We discussed all aspects of the encounter. I agree with the assessment and plan as stated above.   Results of surgery d/w Dr. Prescott Gum and patient. Will need to discuss with IR and colleagues about approach to vascular mass.  Renal function is worse post-op likely due to ATN. Co-ox and volume status ok. Will continue supportive care. Would not start inotropes at this point.   We will follow.  Daniel Bensimhon,MD 9:05 AM

## 2013-09-11 NOTE — Progress Notes (Signed)
1 Day Post-Op Procedure(s) (LRB): MEDIASTINOTOMY CHAMBERLAIN MCNEIL (Right) Subjective: Postop R chanberlain- bx mediastinal mass- c/w vascular malformation Complains of numb painful fingers bilat w/o weakness Post op acute renal failure and hyperkalemia Nonischemic CM EF .15, V02 max 9.1 Bladder cancer Objective: Vital signs in last 24 hours: Temp:  [98.6 F (37 C)-99.8 F (37.7 C)] 98.8 F (37.1 C) (07/08 0743) Pulse Rate:  [80-95] 92 (07/08 0900) Cardiac Rhythm:  [-] A-V Sequential paced;Other (Comment) (07/08 0800) Resp:  [0-41] 0 (07/08 0900) BP: (92-128)/(51-84) 113/61 mmHg (07/08 0900) SpO2:  [96 %-100 %] 100 % (07/08 0900) Arterial Line BP: (84-131)/(51-82) 104/74 mmHg (07/08 0900) FiO2 (%):  [40 %] 40 % (07/08 0000) Weight:  [254 lb 3.1 oz (115.3 kg)] 254 lb 3.1 oz (115.3 kg) (07/08 0600)  Hemodynamic parameters for last 24 hours:   nsr c0-0x 60% Intake/Output from previous day: 07/07 0701 - 07/08 0700 In: 2750 [I.V.:2700; IV Piggyback:50] Out: 2075 [Urine:1225; Blood:500; Chest Tube:350] Intake/Output this shift: Total I/O In: 50 [I.V.:50] Out: 45 [Urine:45]   EXAM bs distal Mild edema  nsr   Lab Results:  Recent Labs  09/10/13 0542 09/11/13 0433  WBC 15.3* 18.1*  HGB 16.7 15.1  HCT 48.6 44.8  PLT 158 155   BMET:  Recent Labs  09/10/13 1749 09/11/13 0433  NA 138 135*  K 4.4 6.0*  CL 100 100  CO2 23 20  GLUCOSE 123* 137*  BUN 32* 35*  CREATININE 2.31* 2.48*  CALCIUM 8.1* 8.3*    PT/INR:  Recent Labs  09/10/13 0542  LABPROT 15.3*  INR 1.21   ABG    Component Value Date/Time   PHART 7.364 09/11/2013 0433   HCO3 23.3 09/11/2013 0433   TCO2 25 09/11/2013 0433   ACIDBASEDEF 2.0 09/11/2013 0433   O2SAT 65.8 09/11/2013 0440   CBG (last 3)  No results found for this basename: GLUCAP,  in the last 72 hours  Assessment/Plan: S/P Procedure(s) (LRB): MEDIASTINOTOMY CHAMBERLAIN MCNEIL (Right) ATN with hyperkalemia- kayexalate ordered  and repeat K this pm Keep in ICU Creat rising, follow BMP and monitor CVP  LOS: 1 day    [name],PETER 09/11/2013

## 2013-09-11 NOTE — Progress Notes (Signed)
[facility] BRIEF SICU PROGRESS NOTE  1 Day Post-Op  S/P Procedure(s) (LRB): MEDIASTINOTOMY CHAMBERLAIN MCNEIL (Right)   Stable day NSR w/ stable BP UOP adequate Creatinine up slightly 2.6  Plan: Continue current plan  OWEN,CLARENCE H 09/11/2013 4:52 PM

## 2013-09-12 ENCOUNTER — Encounter: Payer: Self-pay | Admitting: Cardiology

## 2013-09-12 ENCOUNTER — Inpatient Hospital Stay (HOSPITAL_COMMUNITY): Payer: 59

## 2013-09-12 DIAGNOSIS — I509 Heart failure, unspecified: Secondary | ICD-10-CM

## 2013-09-12 LAB — CARBOXYHEMOGLOBIN
Carboxyhemoglobin: 1.7 % — ABNORMAL HIGH (ref 0.5–1.5)
Methemoglobin: 0.7 % (ref 0.0–1.5)
O2 Saturation: 62.4 %
Total hemoglobin: 14.1 g/dL (ref 13.5–18.0)

## 2013-09-12 LAB — COMPREHENSIVE METABOLIC PANEL
ALT: 18 U/L (ref 0–53)
AST: 27 U/L (ref 0–37)
Albumin: 2.9 g/dL — ABNORMAL LOW (ref 3.5–5.2)
Alkaline Phosphatase: 66 U/L (ref 39–117)
Anion gap: 14 (ref 5–15)
BUN: 37 mg/dL — ABNORMAL HIGH (ref 6–23)
CO2: 25 mEq/L (ref 19–32)
Calcium: 8.2 mg/dL — ABNORMAL LOW (ref 8.4–10.5)
Chloride: 98 mEq/L (ref 96–112)
Creatinine, Ser: 2.33 mg/dL — ABNORMAL HIGH (ref 0.50–1.35)
GFR calc Af Amer: 35 mL/min — ABNORMAL LOW (ref >90)
GFR calc non Af Amer: 30 mL/min — ABNORMAL LOW (ref >90)
Glucose, Bld: 137 mg/dL — ABNORMAL HIGH (ref 70–99)
Potassium: 4.1 mEq/L (ref 3.7–5.3)
Sodium: 137 mEq/L (ref 137–147)
Total Bilirubin: 1.7 mg/dL — ABNORMAL HIGH (ref 0.3–1.2)
Total Protein: 6.8 g/dL (ref 6.0–8.3)

## 2013-09-12 LAB — CBC
HCT: 40.7 % (ref 39.0–52.0)
Hemoglobin: 13.9 g/dL (ref 13.0–17.0)
MCH: 29.4 pg (ref 26.0–34.0)
MCHC: 34.2 g/dL (ref 30.0–36.0)
MCV: 86 fL (ref 78.0–100.0)
Platelets: 164 10*3/uL (ref 150–400)
RBC: 4.73 MIL/uL (ref 4.22–5.81)
RDW: 14.7 % (ref 11.5–15.5)
WBC: 18.5 10*3/uL — ABNORMAL HIGH (ref 4.0–10.5)

## 2013-09-12 LAB — MAGNESIUM: Magnesium: 1.2 mg/dL — ABNORMAL LOW (ref 1.5–2.5)

## 2013-09-12 MED ORDER — DEXTROSE 5 % IV SOLN
1.0000 g | Freq: Two times a day (BID) | INTRAVENOUS | Status: DC
  Administered 2013-09-12 – 2013-09-17 (×12): 1 g via INTRAVENOUS
  Filled 2013-09-12 (×15): qty 1

## 2013-09-12 MED ORDER — CEFTAZIDIME 1 G IJ SOLR
1.0000 g | Freq: Two times a day (BID) | INTRAMUSCULAR | Status: DC
  Filled 2013-09-12 (×2): qty 1

## 2013-09-12 MED ORDER — MAGNESIUM SULFATE 4000MG/100ML IJ SOLN
4.0000 g | Freq: Once | INTRAMUSCULAR | Status: AC
  Administered 2013-09-12: 4 g via INTRAVENOUS
  Filled 2013-09-12: qty 100

## 2013-09-12 MED ORDER — FENTANYL CITRATE 0.05 MG/ML IJ SOLN
50.0000 ug | INTRAMUSCULAR | Status: DC | PRN
  Administered 2013-09-12: 50 ug via INTRAVENOUS
  Filled 2013-09-12: qty 2

## 2013-09-12 NOTE — Progress Notes (Signed)
Pt. Has his home CPAP with his FFM from [facility] set up at the bedside. Pt. States that he will place himself on CPAP before going to bed. Pt. Was made aware to call if he needed assistance with CPAP.

## 2013-09-12 NOTE — Progress Notes (Signed)
Patient ID: [name], male   DOB: 22-May-1960, 53 y.o.   MRN: [id number]  SICU Evening rounds:  Hemodynamically stable  Urine output ok  sats 97%

## 2013-09-12 NOTE — Progress Notes (Addendum)
[facility] DAILY ICU PROGRESS NOTE                   [address]            [place] [address]          [phone]   2 Days Post-Op Procedure(s) (LRB): MEDIASTINOTOMY CHAMBERLAIN MCNEIL (Right)  Total Length of Stay:  LOS: 2 days   Subjective: OOB in chair.  Sore from coughing.  No appetite.   Objective: Vital signs in last 24 hours: Temp:  [98.7 F (37.1 C)-99.3 F (37.4 C)] 98.7 F (37.1 C) (07/09 0400) Pulse Rate:  [85-104] 102 (07/09 0727) Cardiac Rhythm:  [-] A-V Sequential paced (07/09 0400) Resp:  [0-28] 22 (07/09 0727) BP: (86-134)/(13-96) 134/90 mmHg (07/09 0727) SpO2:  [96 %-100 %] 99 % (07/09 0727) Arterial Line BP: (104-108)/(74-79) 104/74 mmHg (07/08 0900) Weight:  [257 lb 9.6 oz (116.847 kg)] 257 lb 9.6 oz (116.847 kg) (07/09 0600)  Filed Weights   09/10/13 0545 09/11/13 0600 09/12/13 0600  Weight: 255 lb (115.667 kg) 254 lb 3.1 oz (115.3 kg) 257 lb 9.6 oz (116.847 kg)    Weight change: 3 lb 6.6 oz (1.546 kg)   Hemodynamic parameters for last 24 hours: CVP:  [3 mmHg-47 mmHg] 11 mmHg  Intake/Output from previous day: 07/08 0701 - 07/09 0700 In: 2280 [P.O.:1080; I.V.:1200] Out: 1385 [Urine:1185; Chest Tube:200]  Intake/Output this shift:    Current Meds: Scheduled Meds: . acetaminophen  1,000 mg Oral 4 times per day   Or  . acetaminophen (TYLENOL) oral liquid 160 mg/5 mL  1,000 mg Oral 4 times per day  . antiseptic oral rinse  15 mL Mouth Rinse q12n4p  . atorvastatin  40 mg Oral QHS  . bisacodyl  10 mg Oral Daily  . carvedilol  25 mg Oral BID WC  . chlorhexidine  15 mL Mouth Rinse BID  . digoxin  0.125 mg Oral Daily  . enoxaparin (LOVENOX) injection  30 mg Subcutaneous Q24H  . febuxostat  40 mg Oral Daily  . fentaNYL   Intravenous 6 times per day  . furosemide  40 mg Intravenous Daily  . gabapentin  200 mg Oral BID  . levothyroxine  150 mcg Oral QAC breakfast  . magnesium oxide  400 mg Oral Daily  . senna-docusate  1  tablet Oral QHS   Continuous Infusions: . bupivacaine 0.5 % ON-Q pump SINGLE CATH 400 mL    . dextrose 5 % and 0.9% NaCl 50 mL/hr at 09/11/13 1530   PRN Meds:.colchicine, diphenhydrAMINE, diphenhydrAMINE, naloxone, ondansetron (ZOFRAN) IV, oxyCODONE, sodium chloride   Physical Exam: General appearance: alert, cooperative and no distress Heart: regular rate and rhythm Lungs: Decreased BS in bases, no rales or rhonchi Wound: Clean and dry Chest tube: No air leak    Lab Results: CBC: Recent Labs  09/11/13 0433 09/12/13 0400  WBC 18.1* 18.5*  HGB 15.1 13.9  HCT 44.8 40.7  PLT 155 164   BMET:  Recent Labs  09/11/13 1532 09/12/13 0400  NA 136* 137  K 4.4 4.1  CL 98 98  CO2 24 25  GLUCOSE 134* 137*  BUN 36* 37*  CREATININE 2.66* 2.33*  CALCIUM 8.2* 8.2*    PT/INR:  Recent Labs  09/10/13 0542  LABPROT 15.3*  INR 1.21   Radiology: Dg Chest Port 1 View  09/12/2013   CLINICAL DATA:  R VATS, right paratracheal mass  EXAM: PORTABLE CHEST - 1 VIEW  COMPARISON:  09/11/2013  FINDINGS: Left-sided pacemaker overlies stable enlarged cardiac silhouette. Right chest tube in place without pneumothorax. Right central venous line is unchanged. Right paratracheal mass is again demonstrated. No pulmonary edema. No pneumothorax.  IMPRESSION: 1. Stable support apparatus. 2. Right Chest tube in place without pneumothorax.   Electronically Signed   By: Suzy Bouchard M.D.   On: 09/12/2013 07:32   Dg Chest Port 1 View  09/11/2013   CLINICAL DATA:  Status post mediastinotomy ; peritracheal mass.  EXAM: PORTABLE CHEST - 1 VIEW  COMPARISON:  September 10, 2013.  FINDINGS: Stable cardiomegaly. Right-sided chest tube is unchanged in position without pneumothorax. Right peritracheal mass is again noted. Right internal jugular catheter line is unchanged with distal tip in expected position of the SVC. Left-sided pacemaker is unchanged.  IMPRESSION: No change in right-sided chest tube without evidence of  pneumothorax. Right peritracheal mass is again noted and unchanged.   Electronically Signed   By: Sabino Dick M.D.   On: 09/11/2013 08:07   Dg Chest Port 1 View  09/10/2013   CLINICAL DATA:  Post median sternotomy, chest tube, his past history CHF, AICD, hypertension, coronary artery disease  EXAM: PORTABLE CHEST - 1 VIEW  COMPARISON:  Portable exam 1303 hr compared to 09/10/2013  FINDINGS: RIGHT jugular line stable tip projecting over SVC.  LEFT subclavian AICD leads project over RIGHT atrium and RIGHT ventricle.  RIGHT thoracostomy tube again noted.  Enlargement of cardiac silhouette.  Persistent RIGHT superior mediastinal mass.  Minimal patchy infiltrates in RIGHT perihilar region with questionable atelectasis versus consolidation in LEFT lower lobe.  No gross pleural effusion or pneumothorax.  IMPRESSION: Improved pulmonary infiltrates.  Resolution of pneumothorax seen on previous exam.   Electronically Signed   By: Lavonia Dana M.D.   On: 09/10/2013 13:17   Dg Chest Portable 1 View  09/10/2013   CLINICAL DATA:  OR instrument check.  EXAM: PORTABLE CHEST - 1 VIEW  COMPARISON:  09/09/2013.  FINDINGS: Post right lung surgery with metallic structures projecting over the right upper lung zone having an appearance of surgical clips without curvilinear structure to suggest the presence of retained needle or surgical instrument.  Right chest tube in place. Question tiny lateral and medial right-sided pneumothorax.  Hazy appearance of the right mid-lower lung zone may represent asymmetric pulmonary edema. Hemorrhage from recent lung surgery not entirely excluded contributing to right upper lobe changes.  Prominent cardiomegaly. Sequential pacemaker/AICD is in place. Prominent kink of 1 of the ventricular leads unchanged.  Right central line has been placed. The tip projects over the right lung apex. Exact position indeterminate. Correlation with blood return recommended.  Large right paratracheal mass appears  slightly more prominent which may be related to AP portable magnification.  IMPRESSION: No evidence of retained surgical instrument.  Right chest tube in place. There may be a tiny medial/lateral right sided pneumothorax.  Hazy appearance of the right mid-lower lung zone may represent asymmetric pulmonary edema. Hemorrhage from recent lung surgery not entirely excluded contributing to right upper lobe changes.  Prominent cardiomegaly. Sequential pacemaker/AICD is in place. Prominent kink of 1 of the ventricular leads unchanged.  Right central line has been placed. The tip projects over the right lung apex. Exact position indeterminate. Correlation with blood return recommended.  Large right paratracheal mass appears slightly more prominent which may be related to AP portable magnification.  These results will be called to the ordering clinician or representative by the Radiologist Assistant, and communication  documented in the PACS or zVision Dashboard.   Electronically Signed   By: Chauncey Cruel M.D.   On: 09/10/2013 11:45     Assessment/Plan: S/P Procedure(s) (LRB): MEDIASTINOTOMY CHAMBERLAIN MCNEIL (Right)  ARF/ATN- Cr down slightly this am, UOP has been acceptable. Continue to watch.  Pulm- increase pulm toilet/IS.  Will add flutter valve.  Leukocytosis, no fever- continue to monitor.  With productive cough, may need to start empiric antibiotics.  CT output around 200 ml/last 24 hrs.  Will leave CTs for now.  No air leak, may be able to decrease to water seal.   COLLINS,GINA H 09/12/2013 7:56 AM  Will leave chest tube for 200cc drainage but place to water seal Mobilize OOB to chair, ambulate Cont antibiotics for possible pneumonia- wet cough, elevated WBC, air space dz RLL and get besdside swallow eval

## 2013-09-12 NOTE — Progress Notes (Signed)
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  He is intoelrant to numerous HF meds including spiro (severe hyperkalemia - requiring HD), hydralazine (fuzzy-headed) and imdur (severe HAs).   S/P 12/17/12 bladder cancer removal.   Recently found to have enlarging mediastinal mass now with compression of trachea and esophagus. Last year a transbronchial EBUS biopsy performed by pulmonary was nondiagnostic. He then had a mediastinoscopy which returned thyroid tissue. A thyroid scan subtotally showed some activity in the mediastinum and he underwent thyroid suppressive therapy by Dr. Buddy Duty without success and in fact the mass is enlarging. Because of concern this could relieve be a lymphoma PET scan was performed showing hypermetabolic activity as well as hypermetabolic activity of a left cervical lymph node. The lymph node was biopsied by IR and this showed lymphoid tissue but scant sample size, not enough to perform flow cytometry.   Admitted and underwent R chanberlain- bx mediastinal mass- c/w vascular malformation.   Feels better this am. Renal function improving. Co-ox ok. + cough. Started on abx. + BM   Objective:   Weight Range:  Vital Signs:   Temp:  [98.7 F (37.1 C)-99.1 F (37.3 C)] 98.9 F (37.2 C) (07/09 1157) Pulse Rate:  [77-102] 94 (07/09 1800) Resp:  [14-30] 26 (07/09 1800) BP: (84-139)/(53-90) 113/82 mmHg (07/09 1800) SpO2:  [95 %-100 %] 98 % (07/09 1800) Weight:  [116.847 kg (257 lb 9.6 oz)] 116.847 kg (257 lb 9.6 oz) (07/09 0600) Last BM Date: 09/12/13  Weight change: Filed Weights   09/10/13 0545 09/11/13 0600 09/12/13 0600  Weight:  115.667 kg (255 lb) 115.3 kg (254 lb 3.1 oz) 116.847 kg (257 lb 9.6 oz)    Intake/Output:   Intake/Output Summary (Last 24 hours) at 09/12/13 1813 Last data filed at 09/12/13 1800  Gross per 24 hour  Intake 1353.6 ml  Output    970 ml  Net  383.6 ml     Physical Exam: General:  Sitting in chair No resp difficulty HEENT: normal Neck: supple. JVP 8; RIJ 2L central line; Carotids 2+ bilat; no bruits. No lymphadenopathy or thryomegaly appreciated. Cor: PMI nondisplaced. Regular rate & rhythm. No rubs, gallops or murmurs. Lungs: clear. Decreased at bases Abdomen: soft, nontender, + distended. Good bowel sounds. Extremities: no cyanosis, clubbing, rash, edema Neuro: alert & orientedx3, cranial nerves grossly intact. moves all 4 extremities w/o difficulty. Affect pleasant  Telemetry: SR  Labs: Basic Metabolic Panel:  Recent Labs Lab 09/09/13 1504 09/10/13 1749 09/11/13 0433 09/11/13 1532 09/12/13 0400 09/12/13 1016  NA 136* 138 135* 136* 137  --   K 5.2 4.4 6.0* 4.4 4.1  --   CL 101 100 100 98 98  --   CO2 16* 23 20 24 25   --   GLUCOSE 93 123* 137* 134* 137*  --   BUN 25* 32* 35* 36* 37*  --   CREATININE 1.59* 2.31* 2.48* 2.66* 2.33*  --   CALCIUM 9.4 8.1* 8.3* 8.2* 8.2*  --   MG  --   --   --   --   --  1.2*    Liver Function Tests:  Recent Labs Lab 09/09/13 1504 09/12/13 0400  AST  29 27  ALT 28 18  ALKPHOS 80 66  BILITOT 1.0 1.7*  PROT 7.9 6.8  ALBUMIN 3.7 2.9*   No results found for this basename: LIPASE, AMYLASE,  in the last 168 hours No results found for this basename: AMMONIA,  in the last 168 hours  CBC:  Recent Labs Lab 09/09/13 1504 09/10/13 0542 09/11/13 0433 09/12/13 0400  WBC 10.2 15.3* 18.1* 18.5*  HGB 18.2* 16.7 15.1 13.9  HCT 52.8* 48.6 44.8 40.7  MCV 87.3 85.0 85.5 86.0  PLT 167 158 155 164    Cardiac Enzymes: No results found for this basename: CKTOTAL, CKMB, CKMBINDEX, TROPONINI,  in the last 168 hours  BNP: BNP (last 3  results) No results found for this basename: PROBNP,  in the last 8760 hours   Imaging: Dg Chest Port 1 View  09/12/2013   CLINICAL DATA:  R VATS, right paratracheal mass  EXAM: PORTABLE CHEST - 1 VIEW  COMPARISON:  09/11/2013  FINDINGS: Left-sided pacemaker overlies stable enlarged cardiac silhouette. Right chest tube in place without pneumothorax. Right central venous line is unchanged. Right paratracheal mass is again demonstrated. No pulmonary edema. No pneumothorax.  IMPRESSION: 1. Stable support apparatus. 2. Right Chest tube in place without pneumothorax.   Electronically Signed   By: Suzy Bouchard M.D.   On: 09/12/2013 07:32   Dg Chest Port 1 View  09/11/2013   CLINICAL DATA:  Status post mediastinotomy ; peritracheal mass.  EXAM: PORTABLE CHEST - 1 VIEW  COMPARISON:  September 10, 2013.  FINDINGS: Stable cardiomegaly. Right-sided chest tube is unchanged in position without pneumothorax. Right peritracheal mass is again noted. Right internal jugular catheter line is unchanged with distal tip in expected position of the SVC. Left-sided pacemaker is unchanged.  IMPRESSION: No change in right-sided chest tube without evidence of pneumothorax. Right peritracheal mass is again noted and unchanged.   Electronically Signed   By: Sabino Dick M.D.   On: 09/11/2013 08:07     Medications:     Scheduled Medications: . acetaminophen  1,000 mg Oral 4 times per day   Or  . acetaminophen (TYLENOL) oral liquid 160 mg/5 mL  1,000 mg Oral 4 times per day  . antiseptic oral rinse  15 mL Mouth Rinse q12n4p  . atorvastatin  40 mg Oral QHS  . bisacodyl  10 mg Oral Daily  . carvedilol  25 mg Oral BID WC  . cefTAZidime (FORTAZ)  IV  1 g Intravenous Q12H  . chlorhexidine  15 mL Mouth Rinse BID  . digoxin  0.125 mg Oral Daily  . enoxaparin (LOVENOX) injection  30 mg Subcutaneous Q24H  . febuxostat  40 mg Oral Daily  . gabapentin  200 mg Oral BID  . levothyroxine  150 mcg Oral QAC breakfast  . magnesium oxide   400 mg Oral Daily  . senna-docusate  1 tablet Oral QHS    Infusions: . bupivacaine 0.5 % ON-Q pump SINGLE CATH 400 mL    . dextrose 5 % and 0.9% NaCl 50 mL/hr at 09/12/13 1418    PRN Medications: colchicine, fentaNYL, oxyCODONE   Assessment:   1) Chronic systolic HF - EF 16% (03/958) 2) NICM - s/p Medtronic ICD 3) Mediastinal mass c/w vascular malformation 4) Acute on chronic renal failure, stage IV 5) Bladder Cancer 6) Hypomagnesemia  Plan/Discussion:    Improving post-op. Renal function getting better. Will likely restart diuretics in am. Supp mag.   Likely has atelectasis. Encouraged IS.   Will  need to discuss with IR and Vascular colleagues about approach to vascular mass.   Length of Stay: 2 Glori Bickers MD 09/12/2013, 6:13 PM  Advanced Heart Failure Team Pager [phone] (M-F; 7a - 4p)  Please contact [facility] for night-coverage after hours (4p -7a ) and weekends on [email or web address]

## 2013-09-12 NOTE — Progress Notes (Signed)
Pt c/o feeling lightheaded "as if he would faint" while sitting in chair. Pt c/o pressure 4/10 in upper chest lasting approximately 10 minutes. Refused pain meds. 12 lead obtained, MD Prescott Gum paged, however MD Bensimhon on unit and aware. Pt at this time denies pain, feels much better, no more light headedness. Will continue to monitor pt.  [name]

## 2013-09-13 ENCOUNTER — Encounter: Payer: Self-pay | Admitting: *Deleted

## 2013-09-13 ENCOUNTER — Inpatient Hospital Stay (HOSPITAL_COMMUNITY): Payer: 59

## 2013-09-13 LAB — CBC
HCT: 39.8 % (ref 39.0–52.0)
Hemoglobin: 13.3 g/dL (ref 13.0–17.0)
MCH: 28.7 pg (ref 26.0–34.0)
MCHC: 33.4 g/dL (ref 30.0–36.0)
MCV: 86 fL (ref 78.0–100.0)
Platelets: 166 10*3/uL (ref 150–400)
RBC: 4.63 MIL/uL (ref 4.22–5.81)
RDW: 14.6 % (ref 11.5–15.5)
WBC: 17.2 10*3/uL — ABNORMAL HIGH (ref 4.0–10.5)

## 2013-09-13 LAB — COMPREHENSIVE METABOLIC PANEL
ALT: 18 U/L (ref 0–53)
AST: 28 U/L (ref 0–37)
Albumin: 2.9 g/dL — ABNORMAL LOW (ref 3.5–5.2)
Alkaline Phosphatase: 72 U/L (ref 39–117)
Anion gap: 13 (ref 5–15)
BUN: 38 mg/dL — ABNORMAL HIGH (ref 6–23)
CO2: 25 mEq/L (ref 19–32)
Calcium: 8.2 mg/dL — ABNORMAL LOW (ref 8.4–10.5)
Chloride: 97 mEq/L (ref 96–112)
Creatinine, Ser: 2.07 mg/dL — ABNORMAL HIGH (ref 0.50–1.35)
GFR calc Af Amer: 40 mL/min — ABNORMAL LOW (ref >90)
GFR calc non Af Amer: 35 mL/min — ABNORMAL LOW (ref >90)
Glucose, Bld: 122 mg/dL — ABNORMAL HIGH (ref 70–99)
Potassium: 4.1 mEq/L (ref 3.7–5.3)
Sodium: 135 mEq/L — ABNORMAL LOW (ref 137–147)
Total Bilirubin: 1.1 mg/dL (ref 0.3–1.2)
Total Protein: 6.9 g/dL (ref 6.0–8.3)

## 2013-09-13 LAB — CARBOXYHEMOGLOBIN
Carboxyhemoglobin: 1.4 % (ref 0.5–1.5)
Methemoglobin: 0.6 % (ref 0.0–1.5)
O2 Saturation: 56.8 %
Total hemoglobin: 13.8 g/dL (ref 13.5–18.0)

## 2013-09-13 MED ORDER — SODIUM CHLORIDE 0.9 % IJ SOLN
10.0000 mL | INTRAMUSCULAR | Status: DC | PRN
  Administered 2013-09-14 – 2013-09-17 (×5): 10 mL via INTRAVENOUS

## 2013-09-13 MED ORDER — FUROSEMIDE 40 MG PO TABS
40.0000 mg | ORAL_TABLET | Freq: Every day | ORAL | Status: DC
  Administered 2013-09-13 – 2013-09-14 (×2): 40 mg via ORAL
  Filled 2013-09-13 (×4): qty 1

## 2013-09-13 MED ORDER — SODIUM CHLORIDE 0.9 % IJ SOLN
10.0000 mL | Freq: Two times a day (BID) | INTRAMUSCULAR | Status: DC
  Administered 2013-09-13: 20 mL via INTRAVENOUS

## 2013-09-13 MED ORDER — SODIUM CHLORIDE 0.9 % IJ SOLN
3.0000 mL | INTRAMUSCULAR | Status: DC | PRN
Start: 2013-09-13 — End: 2013-09-18

## 2013-09-13 MED ORDER — MILRINONE IN DEXTROSE 20 MG/100ML IV SOLN
0.1250 ug/kg/min | INTRAVENOUS | Status: DC
  Administered 2013-09-13 – 2013-09-17 (×10): 0.25 ug/kg/min via INTRAVENOUS
  Filled 2013-09-13 (×11): qty 100

## 2013-09-13 MED ORDER — GUAIFENESIN ER 600 MG PO TB12
600.0000 mg | ORAL_TABLET | Freq: Two times a day (BID) | ORAL | Status: DC
  Administered 2013-09-13 – 2013-09-14 (×3): 600 mg via ORAL
  Filled 2013-09-13 (×13): qty 1

## 2013-09-13 MED ORDER — SODIUM CHLORIDE 0.9 % IJ SOLN
3.0000 mL | Freq: Two times a day (BID) | INTRAMUSCULAR | Status: DC
  Administered 2013-09-13 – 2013-09-17 (×6): 3 mL via INTRAVENOUS

## 2013-09-13 MED ORDER — TOBRAMYCIN-DEXAMETHASONE 0.3-0.1 % OP OINT
TOPICAL_OINTMENT | Freq: Three times a day (TID) | OPHTHALMIC | Status: DC
  Administered 2013-09-13 – 2013-09-18 (×16): via OPHTHALMIC
  Filled 2013-09-13: qty 3.5

## 2013-09-13 NOTE — Progress Notes (Signed)
[facility] DAILY ICU PROGRESS NOTE                   [address]            [place] [address]          [phone]   3 Days Post-Op Procedure(s) (LRB): MEDIASTINOTOMY CHAMBERLAIN MCNEIL (Right)  Total Length of Stay:  LOS: 3 days   Subjective: Comfortable, breathing stable.  Not coughing as much today.   Objective: Vital signs in last 24 hours: Temp:  [97.3 F (36.3 C)-99.6 F (37.6 C)] 97.3 F (36.3 C) (07/10 0400) Pulse Rate:  [77-98] 89 (07/10 0700) Cardiac Rhythm:  [-] A-V Sequential paced (07/10 0700) Resp:  [15-30] 23 (07/10 0700) BP: (84-139)/(61-95) 112/78 mmHg (07/10 0700) SpO2:  [93 %-100 %] 97 % (07/10 0700) Weight:  [261 lb 7.5 oz (118.6 kg)] 261 lb 7.5 oz (118.6 kg) (07/10 0600)  Filed Weights   09/11/13 0600 09/12/13 0600 09/13/13 0600  Weight: 254 lb 3.1 oz (115.3 kg) 257 lb 9.6 oz (116.847 kg) 261 lb 7.5 oz (118.6 kg)    Weight change: 3 lb 13.9 oz (1.753 kg)   Hemodynamic parameters for last 24 hours: CVP:  [0 mmHg-17 mmHg] 10 mmHg  Intake/Output from previous day: 07/09 0701 - 07/10 0700 In: 2003.6 [P.O.:600; I.V.:1203.6; IV Piggyback:200] Out: 850 [Urine:720; Chest Tube:130]  Intake/Output this shift:    Current Meds: Scheduled Meds: . acetaminophen  1,000 mg Oral 4 times per day   Or  . acetaminophen (TYLENOL) oral liquid 160 mg/5 mL  1,000 mg Oral 4 times per day  . antiseptic oral rinse  15 mL Mouth Rinse q12n4p  . atorvastatin  40 mg Oral QHS  . bisacodyl  10 mg Oral Daily  . carvedilol  25 mg Oral BID WC  . cefTAZidime (FORTAZ)  IV  1 g Intravenous Q12H  . chlorhexidine  15 mL Mouth Rinse BID  . digoxin  0.125 mg Oral Daily  . enoxaparin (LOVENOX) injection  30 mg Subcutaneous Q24H  . febuxostat  40 mg Oral Daily  . gabapentin  200 mg Oral BID  . levothyroxine  150 mcg Oral QAC breakfast  . magnesium oxide  400 mg Oral Daily  . senna-docusate  1 tablet Oral QHS   Continuous Infusions: . bupivacaine 0.5 % ON-Q  pump SINGLE CATH 400 mL    . dextrose 5 % and 0.9% NaCl 50 mL/hr at 09/13/13 0700   PRN Meds:.colchicine, fentaNYL, oxyCODONE   Physical Exam: General appearance: alert, cooperative and no distress Heart: regular rate and rhythm Lungs: Slightly decreased BS in bases  Wound: Clean and dry Chest tube: No air leak    Lab Results: CBC: Recent Labs  09/12/13 0400 09/13/13 0358  WBC 18.5* 17.2*  HGB 13.9 13.3  HCT 40.7 39.8  PLT 164 166   BMET:  Recent Labs  09/12/13 0400 09/13/13 0358  NA 137 135*  K 4.1 4.1  CL 98 97  CO2 25 25  GLUCOSE 137* 122*  BUN 37* 38*  CREATININE 2.33* 2.07*  CALCIUM 8.2* 8.2*    PT/INR: No results found for this basename: LABPROT, INR,  in the last 72 hours Radiology: Dg Chest Port 1 View  09/13/2013   CLINICAL DATA:  Status post biopsy of right peritracheal mass.  EXAM: PORTABLE CHEST - 1 VIEW  COMPARISON:  One-view chest 09/12/2013  FINDINGS: The heart is enlarged. Pacing and AICD wires are in place. A  right IJ line is stable. A right-sided chest tube is stable. There is no significant pneumothorax. The right peritracheal mass is again noted. The heart obscures the left lower lobe. No significant airspace disease is present. The lung volumes are low.  IMPRESSION: 1. Stable cardiomegaly without failure. 2. Right peritracheal mass. 3. Support apparatus stable.  There is no significant pneumothorax.   Electronically Signed   By: Lawrence Santiago M.D.   On: 09/13/2013 07:37   Dg Chest Port 1 View  09/12/2013   CLINICAL DATA:  R VATS, right paratracheal mass  EXAM: PORTABLE CHEST - 1 VIEW  COMPARISON:  09/11/2013  FINDINGS: Left-sided pacemaker overlies stable enlarged cardiac silhouette. Right chest tube in place without pneumothorax. Right central venous line is unchanged. Right paratracheal mass is again demonstrated. No pulmonary edema. No pneumothorax.  IMPRESSION: 1. Stable support apparatus. 2. Right Chest tube in place without pneumothorax.    Electronically Signed   By: Suzy Bouchard M.D.   On: 09/12/2013 07:32     Assessment/Plan: S/P Procedure(s) (LRB): MEDIASTINOTOMY CHAMBERLAIN MCNEIL (Right) ARF/ATN- Cr trending down. Continue to watch.  Pulm- increase pulm toilet/IS/FV.  Will add Mucinex to help with secretions. Leukocytosis, no fever- WBC down some today, continue to monitor. On empiric Fortaz D#2. CT output low, no air leak, CXR stable.  Hopefully can d/c CT soon. Will d/c On-Q, saline lock IVF, continue ambulation. Possibly tx to floor soon.   Weronika Birch H 09/13/2013 7:42 AM

## 2013-09-13 NOTE — Progress Notes (Signed)
edit

## 2013-09-13 NOTE — Progress Notes (Signed)
Patient ID: [name], male   DOB: 1961-02-06, 53 y.o.   MRN: [id number] Per Dr. Lucianne Lei Trigt's request, Dr. Elmarie Shiley, the nephrologist on call, was contacted to consult on [name] post op.

## 2013-09-13 NOTE — Plan of Care (Signed)
Problem: Phase II Progression Outcomes Goal: Discharge plan established Outcome: Progressing wife

## 2013-09-13 NOTE — Progress Notes (Addendum)
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  He is intoelrant to numerous HF meds including spiro (severe hyperkalemia - requiring HD), hydralazine (fuzzy-headed) and imdur (severe HAs).   S/P 12/17/12 bladder cancer removal.   Recently found to have enlarging mediastinal mass now with compression of trachea and esophagus. Last year a transbronchial EBUS biopsy performed by pulmonary was nondiagnostic. He then had a mediastinoscopy which returned thyroid tissue. A thyroid scan subtotally showed some activity in the mediastinum and he underwent thyroid suppressive therapy by Dr. Buddy Duty without success and in fact the mass is enlarging. Because of concern this could relieve be a lymphoma PET scan was performed showing hypermetabolic activity as well as hypermetabolic activity of a left cervical lymph node. The lymph node was biopsied by IR and this showed lymphoid tissue but scant sample size, not enough to perform flow cytometry.   Admitted and underwent R chanberlain- bx mediastinal mass- c/w vascular malformation.   Doing better. Renal function continues to improve. Co-ox 57%. Mag 1.2. Denies SOB or CP.    Objective:   Weight Range:  Vital Signs:   Temp:  [97.3 F (36.3 C)-99.6 F (37.6 C)] 98.3 F (36.8 C) (07/10 0840) Pulse Rate:  [80-99] 90 (07/10 1020) Resp:  [15-30] 25 (07/10 1000) BP: (93-139)/(63-96) 113/90 mmHg (07/10 1000) SpO2:  [93 %-100 %] 98 % (07/10 1000) Weight:  [261 lb 7.5 oz (118.6 kg)] 261 lb 7.5 oz (118.6 kg) (07/10 0600) Last BM Date: 09/12/13  Weight change: Filed Weights   09/11/13 0600 09/12/13 0600 09/13/13 0600  Weight: 254  lb 3.1 oz (115.3 kg) 257 lb 9.6 oz (116.847 kg) 261 lb 7.5 oz (118.6 kg)    Intake/Output:   Intake/Output Summary (Last 24 hours) at 09/13/13 1157 Last data filed at 09/13/13 1100  Gross per 24 hour  Intake   2000 ml  Output    830 ml  Net   1170 ml     Physical Exam: General:  Sitting in chair No resp difficulty HEENT: normal Neck: supple. JVP 8; RIJ 2L central line; Carotids 2+ bilat; no bruits. No lymphadenopathy or thryomegaly appreciated. Cor: PMI nondisplaced. Regular rate & rhythm. No rubs, gallops or murmurs. Lungs: clear. Decreased at bases Abdomen: soft, nontender, + distended. Good bowel sounds. Extremities: no cyanosis, clubbing, rash, edema Neuro: alert & orientedx3, cranial nerves grossly intact. moves all 4 extremities w/o difficulty. Affect pleasant  Telemetry: SR  Labs: Basic Metabolic Panel:  Recent Labs Lab 09/10/13 1749 09/11/13 0433 09/11/13 1532 09/12/13 0400 09/12/13 1016 09/13/13 0358  NA 138 135* 136* 137  --  135*  K 4.4 6.0* 4.4 4.1  --  4.1  CL 100 100 98 98  --  97  CO2 23 20 24 25   --  25  GLUCOSE 123* 137* 134* 137*  --  122*  BUN 32* 35* 36* 37*  --  38*  CREATININE 2.31* 2.48* 2.66* 2.33*  --  2.07*  CALCIUM 8.1* 8.3* 8.2* 8.2*  --  8.2*  MG  --   --   --   --  1.2*  --     Liver Function Tests:  Recent Labs Lab 09/09/13  1504 09/12/13 0400 09/13/13 0358  AST 29 27 28   ALT 28 18 18   ALKPHOS 80 66 72  BILITOT 1.0 1.7* 1.1  PROT 7.9 6.8 6.9  ALBUMIN 3.7 2.9* 2.9*   No results found for this basename: LIPASE, AMYLASE,  in the last 168 hours No results found for this basename: AMMONIA,  in the last 168 hours  CBC:  Recent Labs Lab 09/09/13 1504 09/10/13 0542 09/11/13 0433 09/12/13 0400 09/13/13 0358  WBC 10.2 15.3* 18.1* 18.5* 17.2*  HGB 18.2* 16.7 15.1 13.9 13.3  HCT 52.8* 48.6 44.8 40.7 39.8  MCV 87.3 85.0 85.5 86.0 86.0  PLT 167 158 155 164 166    Cardiac Enzymes: No results found for this basename:  CKTOTAL, CKMB, CKMBINDEX, TROPONINI,  in the last 168 hours  BNP: BNP (last 3 results) No results found for this basename: PROBNP,  in the last 8760 hours   Imaging: Dg Chest Port 1 View  09/13/2013   CLINICAL DATA:  Status post biopsy of right peritracheal mass.  EXAM: PORTABLE CHEST - 1 VIEW  COMPARISON:  One-view chest 09/12/2013  FINDINGS: The heart is enlarged. Pacing and AICD wires are in place. A right IJ line is stable. A right-sided chest tube is stable. There is no significant pneumothorax. The right peritracheal mass is again noted. The heart obscures the left lower lobe. No significant airspace disease is present. The lung volumes are low.  IMPRESSION: 1. Stable cardiomegaly without failure. 2. Right peritracheal mass. 3. Support apparatus stable.  There is no significant pneumothorax.   Electronically Signed   By: Lawrence Santiago M.D.   On: 09/13/2013 07:37   Dg Chest Port 1 View  09/12/2013   CLINICAL DATA:  R VATS, right paratracheal mass  EXAM: PORTABLE CHEST - 1 VIEW  COMPARISON:  09/11/2013  FINDINGS: Left-sided pacemaker overlies stable enlarged cardiac silhouette. Right chest tube in place without pneumothorax. Right central venous line is unchanged. Right paratracheal mass is again demonstrated. No pulmonary edema. No pneumothorax.  IMPRESSION: 1. Stable support apparatus. 2. Right Chest tube in place without pneumothorax.   Electronically Signed   By: Suzy Bouchard M.D.   On: 09/12/2013 07:32     Medications:     Scheduled Medications: . acetaminophen  1,000 mg Oral 4 times per day   Or  . acetaminophen (TYLENOL) oral liquid 160 mg/5 mL  1,000 mg Oral 4 times per day  . antiseptic oral rinse  15 mL Mouth Rinse q12n4p  . atorvastatin  40 mg Oral QHS  . bisacodyl  10 mg Oral Daily  . carvedilol  25 mg Oral BID WC  . cefTAZidime (FORTAZ)  IV  1 g Intravenous Q12H  . chlorhexidine  15 mL Mouth Rinse BID  . digoxin  0.125 mg Oral Daily  . enoxaparin (LOVENOX) injection   30 mg Subcutaneous Q24H  . febuxostat  40 mg Oral Daily  . gabapentin  200 mg Oral BID  . guaiFENesin  600 mg Oral BID  . levothyroxine  150 mcg Oral QAC breakfast  . magnesium oxide  400 mg Oral Daily  . senna-docusate  1 tablet Oral QHS  . sodium chloride  10-20 mL Intravenous Q12H  . sodium chloride  3 mL Intravenous Q12H  . tobramycin-dexamethasone   Right Eye TID    Infusions:    PRN Medications: colchicine, fentaNYL, oxyCODONE, sodium chloride, sodium chloride   Assessment:   1) Chronic systolic HF - EF 09% (05/8180) 2) NICM -  s/p Medtronic ICD 3) Mediastinal mass c/w vascular malformation 4) Acute on chronic renal failure, stage IV 5) Bladder Cancer 6) Hypomagnesemia  Plan/Discussion:    Stable overnight and transferred to the floor. Renal function improving, weight is starting to trend up and JVD up. Will restart lasix 40 mg QOD and continue to follow renal function closely.   Mag 1.2 yesterday and was supplemented. Will continue to follow.   Likely has atelectasis. Encouraged IS.   Will need to discuss with IR and Vascular colleagues about approach to vascular mass.  Length of Stay: 3 Junie Bame B MD 09/13/2013, 11:57 AM  Advanced Heart Failure Team Pager [phone] (M-F; [place])  Please contact [facility] for night-coverage after hours (4p -7a ) and weekends on [email or web address]  Patient seen and examined with Junie Bame, NP. We discussed all aspects of the encounter. I agree with the assessment and plan as stated above.   He is improving. Renal function better. Weight up slightly. We have reviewed case with IR in detail this am. The mass is very hot on PET. Suspect it may be related to tracer pooling and not actual malignancy based on intraoperative findings. Suspect it may be compression of azygous vein or other venous structure. They feel like it will require additional imaging with contrast. Given CKD. Will start milrinone to try to optimize renal  perfusion. Can restart hoem diuretics. Will d/w Dr. Prescott Gum.   Magnesium supplemented. Will recheck in am.   Benay Spice 1:03 PM  Addendum: I spoke with Dr. Pascal Lux again from IR. 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 We still feel CT with contrast is indicated to assess blood flow. Will start milrinone to try to improve renal function pre- CT.   Tammela Bales,MD 2:20 PM

## 2013-09-13 NOTE — Progress Notes (Signed)
Patient sets himself on CPAP. No issues

## 2013-09-13 NOTE — Consult Note (Signed)
Reason for Consult: CKD 4  Referring Physician: Dr. Tharon Aquas Trigt.  [name] is an 53 y.o. 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 Recs are that a contrast Ct scan might give more information . Pt has been doing well since the procedure, but requested to have nephrology consulted as he has CKD stage 3-4, and follows with Dr. Marval Regal. Pt has had dialysis in the past, doesnt want to go that route, and concerned about possible contrast needed to evaluate his mediastinal mass.  Pt Cr during this has been mildly elevated compared to 3 months ago, but appears to be trending down to his baseline. No nephrotoxics on admission,  No contrast exposure throughout admission, no feats of urinary obstruction, no diarrhea or vomiting.  Pt feels his abdomen is a little fuller than usual, has some leg swelling, not new for pt, usually takes Lasix- 40mg  q48h at home for this. No nausea or vomiting.  Trend in Creatinine: Creat  Date/Time Value Ref Range Status  06/28/2012 12:28 PM 1.76* 0.50 - 1.35 mg/dL Final     Creatinine, Ser  Date/Time Value Ref Range Status  09/13/2013  3:58 AM 2.07* 0.50 - 1.35 mg/dL Final  09/12/2013  4:00 AM 2.33* 0.50 - 1.35 mg/dL Final  09/11/2013  3:32 PM 2.66* 0.50 - 1.35 mg/dL Final  09/11/2013  4:33 AM 2.48* 0.50 - 1.35 mg/dL Final  09/10/2013  5:49 PM 2.31* 0.50 - 1.35 mg/dL Final  09/09/2013  3:04 PM 1.59* 0.50 - 1.35 mg/dL Final  06/12/2013  2:36 PM 1.92* 0.50 - 1.35 mg/dL Final  01/29/2013 12:43 PM 1.5  0.4 - 1.5 mg/dL Final  01/02/2013  2:07 PM 1.37* 0.50 - 1.35 mg/dL Final  10/24/2012  8:40 AM 1.36* 0.50 -  1.35 mg/dL Final  10/08/2012  1:45 PM 1.28  0.50 - 1.35 mg/dL Final  10/01/2012  9:10 AM 1.30  0.50 - 1.35 mg/dL Final  08/03/2012  1:50 PM 1.41* 0.50 - 1.35 mg/dL Final  06/30/2012  5:11 AM 1.54* 0.50 - 1.35 mg/dL Final  06/29/2012  5:40 AM 1.69* 0.50 - 1.35 mg/dL Final  06/28/2012  2:45 PM 1.75* 0.50 - 1.35 mg/dL Final  05/02/2012  9:15 AM 1.29  0.50 - 1.35 mg/dL Final  04/23/2012  3:30 PM 1.19  0.50 - 1.35 mg/dL Final  04/12/2012 11:34 AM 1.7* 0.4 - 1.5 mg/dL Final  03/26/2012  4:00 PM 1.15  0.50 - 1.35 mg/dL Final  02/21/2012 11:08 AM 1.35  0.50 - 1.35 mg/dL Final  01/31/2012  2:30 PM 1.66* 0.50 - 1.35 mg/dL Final  01/29/2012  6:43 AM 1.98* 0.50 - 1.35 mg/dL Final  01/28/2012  3:18 PM 2.15* 0.50 - 1.35 mg/dL Final  01/28/2012  5:40 AM 2.39* 0.50 - 1.35 mg/dL Final  01/27/2012  5:20 AM 2.03* 0.50 - 1.35 mg/dL Final  01/26/2012  7:35 PM 1.84* 0.50 - 1.35 mg/dL Final  01/26/2012  7:55 AM 2.48* 0.50 - 1.35 mg/dL Final  01/26/2012  2:00 AM 2.54* 0.50 - 1.35 mg/dL Final  01/25/2012 10:02 PM 2.23* 0.50 - 1.35 mg/dL Final  01/25/2012  5:53 PM 2.38* 0.50 - 1.35 mg/dL Final  01/25/2012  3:34 PM 2.47*  0.50 - 1.35 mg/dL Final  04/07/2011 12:16 PM 1.5  0.4 - 1.5 mg/dL Final  11/03/2009  1:41 PM 1.2  0.4-1.5 mg/dL Final  07/06/2009  4:20 AM 1.72* 0.4 - 1.5 mg/dL Final  07/05/2009  4:15 AM 1.68* 0.4 - 1.5 mg/dL Final  07/04/2009  4:35 AM 1.61* 0.4 - 1.5 mg/dL Final  06/10/2008  2:25 PM 1.50  0.4 - 1.5 mg/dL Final  04/12/2008  4:30 AM 1.65* 0.4 - 1.5 mg/dL Final  04/11/2008  4:55 AM 1.51* 0.4 - 1.5 mg/dL Final  04/10/2008  9:35 AM 1.35  0.4 - 1.5 mg/dL Final  04/03/2008 10:26 AM 1.6* 0.4-1.5 mg/dL Final  03/25/2008 11:08 AM 1.6* 0.4-1.5 mg/dL Final  03/09/2008  3:17 PM 1.7* 0.4 - 1.5 mg/dL Final  12/12/2006  4:15 AM 1.85*  Final  12/11/2006  4:45 AM 2.01*  Final  12/10/2006  4:50 AM 1.73*  Final  12/09/2006  5:15 AM 1.42   Final  12/08/2006  4:30 PM 1.29   Final  11/04/2006  4:07 AM 1.68*  Final  11/03/2006  3:50 AM 1.66*   Final  11/02/2006  5:30 AM 1.51*  Final    PMH:   Past Medical History  Diagnosis Date  . Gout 08/03/12    PT C/O OF RIGHT KNEE PAIN AND SWELLING -STATES GOUT FLARE UP - ONGOING SINCE APRIL - BUT SWELLING W/IN LAST WEEK  . Cardiomyopathy     Idiopathic dilated;   Marland Kitchen Ventricular tachycardia     s/p ICD  . Sleep apnea     CPAP  . Systolic CHF     EF 87%  . CKD (chronic kidney disease)     stage III baseline Crt 1.8-2.0  . Biventricular ICD (implantable cardiac defibrillator) Medtronic ]     DOI 2008/ upgrade 2010/ Gen Change 2014  . Automatic implantable cardioverter-defibrillator in situ   . CHF (congestive heart failure)   . Bladder tumor     PT HOSP AT [facility] 4/24 TO 4/26 2014 WITH UTI--AND FOUND TO HAVE BLADDER TUMOR-  . Arthritis     GOUT  . Hilar mass     Noted CT 2010  . HTN (hypertension)     Dr. Caryl Comes cardiologist  . Coronary artery disease   . Hypothyroidism     PSH:   Past Surgical History  Procedure Laterality Date  . Cholecystectomy    . Cardiac catheterization      he was found to have normal coronary arteries but with a globally dilated and hypocontractile heart  . US echocardiography  03-17-2008, 07-03-2006    EF 15-20%, EF 15-20%  . Transurethral resection of bladder tumor N/A 08/13/2012    Procedure: TRANSURETHRAL RESECTION OF BLADDER TUMOR (TURBT);  Surgeon: Claybon Jabs, MD;  Location: [facility];  Service: Urology;  Laterality: N/A;  MITOMYCIN C  . Icd insertion  04/2012-new ICD  . Endobronchial ultrasound Bilateral 10/01/2012    Procedure: ENDOBRONCHIAL ULTRASOUND;  Surgeon: Collene Gobble, MD;  Location: [facility];  Service: Cardiopulmonary;  Laterality: Bilateral;  . Cornea replacement    . Video bronchoscopy N/A 10/25/2012    Procedure: VIDEO BRONCHOSCOPY;  Surgeon: [name], MD;  Location: [place];  Service: Thoracic;  Laterality: N/A;  . Mediastinoscopy N/A 10/25/2012    Procedure: MEDIASTINOSCOPY;  Surgeon: [name], MD;  Location: [place];   Service: Thoracic;  Laterality: N/A;  . Tonsillectomy    . Mediastinotomy  09/10/2013    paratracheal mass  DR Darcey Nora  . Chest tube insertion  09/10/2013  . Mediastinotomy chamberlain mcneil Right 09/10/2013    Procedure: MEDIASTINOTOMY CHAMBERLAIN MCNEIL;  Surgeon: [name], MD;  Location: [place];  Service: Thoracic;  Laterality: Right;    Allergies:  Allergies  Allergen Reactions  . Bidil [Isosorb Dinitrate-Hydralazine]     Headaches   . Hydralazine Hcl     Fuzzy headed  . Spironolactone     Hyperkalemia     Medications:   Prior to Admission medications   Medication Sig Start Date End Date Taking? Authorizing Provider  atorvastatin (LIPITOR) 40 MG tablet Take 40 mg by mouth at bedtime.  04/03/12  Yes Thayer Headings, MD  carvedilol (COREG) 25 MG tablet Take 1 tablet (25 mg total) by mouth 2 (two) times daily with a meal. 02/25/13  Yes Amy D Clegg, NP  colchicine 0.6 MG tablet Take 0.6 mg by mouth daily as needed (gout).   Yes Historical Provider, MD  digoxin (LANOXIN) 0.25 MG tablet Take 0.5 tablets (0.125 mg total) by mouth daily. 02/11/13  Yes Jolaine Artist, MD  febuxostat (ULORIC) 40 MG tablet Take 40 mg by mouth daily.    Yes Historical Provider, MD  furosemide (LASIX) 40 MG tablet Take 1 tablet (40 mg total) by mouth every other day. 04/16/13  Yes Rande Brunt, NP  levothyroxine (SYNTHROID, LEVOTHROID) 150 MCG tablet Take 150 mcg by mouth daily before breakfast.   Yes Historical Provider, MD  magnesium oxide (MAG-OX) 400 MG tablet Take 400 mg by mouth daily.   Yes Historical Provider, MD  olmesartan (BENICAR) 20 MG tablet Take 20 mg by mouth daily.   Yes Historical Provider, MD  OVER THE COUNTER MEDICATION Place 1 drop into both eyes daily as needed (red eyes). Eye drops   Yes Historical Provider, MD    Inpatient medications: . acetaminophen  1,000 mg Oral 4 times per day   Or  . acetaminophen (TYLENOL) oral liquid 160 mg/5 mL  1,000 mg Oral 4 times per  day  . antiseptic oral rinse  15 mL Mouth Rinse q12n4p  . atorvastatin  40 mg Oral QHS  . bisacodyl  10 mg Oral Daily  . carvedilol  25 mg Oral BID WC  . cefTAZidime (FORTAZ)  IV  1 g Intravenous Q12H  . chlorhexidine  15 mL Mouth Rinse BID  . digoxin  0.125 mg Oral Daily  . enoxaparin (LOVENOX) injection  30 mg Subcutaneous Q24H  . febuxostat  40 mg Oral Daily  . furosemide  40 mg Oral Daily  . gabapentin  200 mg Oral BID  . guaiFENesin  600 mg Oral BID  . levothyroxine  150 mcg Oral QAC breakfast  . magnesium oxide  400 mg Oral Daily  . senna-docusate  1 tablet Oral QHS  . sodium chloride  10-20 mL Intravenous Q12H  . sodium chloride  3 mL Intravenous Q12H  . tobramycin-dexamethasone   Right Eye TID    Discontinued Meds:   Medications Discontinued During This Encounter  Medication Reason  . bupivacaine 0.5 % ON-Q pump SINGLE CATH 400 mL Patient Discharge  . hemostatic agents Patient Discharge  . hemostatic agents Patient Discharge  . 0.9 % irrigation (POUR BTL) Patient Discharge  . oxyCODONE (Oxy IR/ROXICODONE) immediate release tablet 5 mg Patient Transfer  . oxyCODONE (ROXICODONE) 5 MG/5ML solution 5 mg Patient Transfer  . HYDROmorphone (DILAUDID) injection 0.25-0.5 mg Patient Transfer  . meperidine (DEMEROL) injection 6.25-12.5 mg Patient Transfer  .  midazolam (VERSED) injection 0.5-2 mg Patient Transfer  . promethazine (PHENERGAN) injection 6.25-12.5 mg Patient Transfer  . ondansetron (ZOFRAN) injection 4 mg Duplicate  . fentaNYL 10 mcg/mL PCA injection Duplicate  . naloxone (NARCAN) injection 0.4 mg Duplicate  . sodium chloride 0.9 % injection 9 mL Duplicate  . diphenhydrAMINE (BENADRYL) injection 93.8 mg Duplicate  . diphenhydrAMINE (BENADRYL) 12.5 MG/5ML elixir 10.1 mg Duplicate  . ondansetron (ZOFRAN) injection 4 mg Duplicate  . potassium chloride 10 mEq in 50 mL *CENTRAL LINE* IVPB   . traMADol (ULTRAM) tablet 50-100 mg   . cefTAZidime (FORTAZ) injection 1 g  Entry Error  . furosemide (LASIX) injection 40 mg   . fentaNYL 10 mcg/mL PCA injection   . naloxone [facility]) injection 0.4 mg   . sodium chloride 0.9 % injection 9 mL   . ondansetron (ZOFRAN) injection 4 mg   . diphenhydrAMINE (BENADRYL) injection 12.5 mg   . diphenhydrAMINE (BENADRYL) 12.5 MG/5ML elixir 12.5 mg   . bupivacaine 0.5 % ON-Q pump SINGLE CATH 400 mL   . dextrose 5 %-0.9 % sodium chloride infusion     Social History:  reports that he has never smoked. He has never used smokeless tobacco. He reports that he does not drink alcohol or use illicit drugs.  Family History:  History reviewed. No pertinent family history.  CONSTITUTIONAL- No Fever, night sweat or complaints of poor appetite. SKIN- No Rash, colour changes or itching. HEAD- No Headache or dizziness. RESPIRATORY- No Cough or SOB. CARDIAC- No Palpitations, DOE, PND or chest pain. GI- No nausea, vomiting, diarrhoea, constipation, abd pain. URINARY- No Frequency, urgency, straining or dysuria. NEUROLOGIC- No Numbness, syncope, seizures or burning. [facility]- Denies depression or anxiety.  Weight change: 3 lb 13.9 oz (1.753 kg)  Intake/Output Summary (Last 24 hours) at 09/13/13 1657 Last data filed at 09/13/13 1600  Gross per 24 hour  Intake 1425.13 ml  Output    610 ml  Net 815.13 ml   BP 96/64  Pulse 81  Temp(Src) 97.4 F (36.3 C) (Oral)  Resp 22  Ht 5\' 10"  (1.778 m)  Wt 261 lb 7.5 oz (118.6 kg)  BMI 37.52 kg/m2  SpO2 99% Filed Vitals:   09/13/13 0911 09/13/13 1000 09/13/13 1020 09/13/13 1217  BP: 126/96 113/90  96/64  Pulse: 99 95 90 81  Temp:    97.4 F (36.3 C)  TempSrc:    Oral  Resp:  25  22  Height:      Weight:      SpO2:  98%  99%     GENERAL- alert, co-operative, appears as stated age, not in any distress. HEENT- Atraumatic, normocephalic, right eye surgery with well healed incisions, mildly dry oral mucosa- has not eaten lunch, ~10 cm horizontal incision on the right side of upper chest,   neck supple. CARDIAC- RRR, no murmurs, rubs or gallops. RESP- Moving equal volumes of air, and clear to auscultation bilaterally, no wheezes or crackles. ABDOMEN- Soft, full, nontender, no guarding or rebound, no palpable masses or organomegaly, bowel sounds present. BACK- Normal curvature of the spine, No tenderness along the vertebrae, no CVA tenderness. NEURO- No obvious Cr N abnormality, strenght upper and lower extremities- normal,  EXTREMITIES- Warm and well perfused, +1 pitting pedal edema up to mid leg. SKIN- Warm, dry, No rash or lesion. PSYCH- Normal mood and affect, appropriate thought content and speech.  Labs: Basic Metabolic Panel:  Recent Labs Lab 09/09/13 1504 09/10/13 1749 09/11/13 0433 09/11/13 1532 09/12/13 0400 09/13/13  0358  NA 136* 138 135* 136* 137 135*  K 5.2 4.4 6.0* 4.4 4.1 4.1  CL 101 100 100 98 98 97  CO2 16* 23 20 24 25 25   GLUCOSE 93 123* 137* 134* 137* 122*  BUN 25* 32* 35* 36* 37* 38*  CREATININE 1.59* 2.31* 2.48* 2.66* 2.33* 2.07*  ALBUMIN 3.7  --   --   --  2.9* 2.9*  CALCIUM 9.4 8.1* 8.3* 8.2* 8.2* 8.2*   Liver Function Tests:  Recent Labs Lab 09/09/13 1504 09/12/13 0400 09/13/13 0358  AST 29 27 28   ALT 28 18 18   ALKPHOS 80 66 72  BILITOT 1.0 1.7* 1.1  PROT 7.9 6.8 6.9  ALBUMIN 3.7 2.9* 2.9*   CBC:  Recent Labs Lab 09/10/13 0542 09/11/13 0433 09/12/13 0400 09/13/13 0358  WBC 15.3* 18.1* 18.5* 17.2*  HGB 16.7 15.1 13.9 13.3  HCT 48.6 44.8 40.7 39.8  MCV 85.0 85.5 86.0 86.0  PLT 158 155 164 166   Xrays/Other Studies: Dg Chest Port 1 View  09/13/2013   CLINICAL DATA:  Status post biopsy of right peritracheal mass.  EXAM: PORTABLE CHEST - 1 VIEW  COMPARISON:  One-view chest 09/12/2013  FINDINGS: The heart is enlarged. Pacing and AICD wires are in place. A right IJ line is stable. A right-sided chest tube is stable. There is no significant pneumothorax. The right peritracheal mass is again noted. The heart obscures the left  lower lobe. No significant airspace disease is present. The lung volumes are low.  IMPRESSION: 1. Stable cardiomegaly without failure. 2. Right peritracheal mass. 3. Support apparatus stable.  There is no significant pneumothorax.   Electronically Signed   By: Lawrence Santiago M.D.   On: 09/13/2013 07:37   Dg Chest Port 1 View  09/12/2013   CLINICAL DATA:  R VATS, right paratracheal mass  EXAM: PORTABLE CHEST - 1 VIEW  COMPARISON:  09/11/2013  FINDINGS: Left-sided pacemaker overlies stable enlarged cardiac silhouette. Right chest tube in place without pneumothorax. Right central venous line is unchanged. Right paratracheal mass is again demonstrated. No pulmonary edema. No pneumothorax.  IMPRESSION: 1. Stable support apparatus. 2. Right Chest tube in place without pneumothorax.   Electronically Signed   By: Suzy Bouchard M.D.   On: 09/12/2013 07:32     Assessment/Plan: 1. Chronic kidney disease 4- Might have had some acute component during this admission, but that appears to be resolving now, Cr today- 2.07, down form peak of 2.66 09/11/2013 . Consider pre-renal etiology due to hypoperfusion during surgery, also pt has a hx of HTN, but presently Bp Soft- systolic 45Y. UA- on the 08/11/2013- hgb and proteinuria, showed large leukocytes, few bacteria, many red blood cells and WBC, pt with hx of bladder mass, Cystoscopy results not on file, urinalysis therefore difficult to interpret. Pt with possible mild fluid overload- might benefit from his home dose of lasix- 40mg  daily. - Consider renal Uss, in the light of pts bladder mass, to rule out obstruction. - Daily Bmets - strict in and out - Daily weights - Renal diet.  2. Leukocytosis- 17.2 today, without sepsis. ? Source of infection. ? Stress reaction ?UTI  3. Hx of HTN, but Bp presently soft, on milrinone.  4. 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  5. CHF- EF- 15%- Management per cards.    6. Hypomagnesemia- 1.2 today. On daily suplimentation of Mag at home- continued on admission. Supplimented with IV Mag 4g yesterday.   Rayanne Padmanabhan,  Jamorris Ndiaye 09/13/2013, 4:57 PM  [facility], PGY-2.

## 2013-09-13 NOTE — Progress Notes (Signed)
3 Days Post-Op Procedure(s) (LRB): MEDIASTINOTOMY CHAMBERLAIN MCNEIL (Right) Subjective: Patient doing well following right Chamberlain procedure and biopsy of mediastinal mass which is consistent with vascular malformation and fortunately not malignancy. Chest tube is removed today. PCA was discontinued and patient is cannulating. Renal function is improved  Objective: Vital signs in last 24 hours: Temp:  [97.3 F (36.3 C)-99.6 F (37.6 C)] 97.4 F (36.3 C) (07/10 1217) Pulse Rate:  [80-99] 81 (07/10 1217) Cardiac Rhythm:  [-] A-V Sequential paced (07/10 1227) Resp:  [19-27] 22 (07/10 1217) BP: (96-127)/(63-96) 96/64 mmHg (07/10 1217) SpO2:  [93 %-100 %] 99 % (07/10 1217) Weight:  [261 lb 7.5 oz (118.6 kg)] 261 lb 7.5 oz (118.6 kg) (07/10 0600)  Hemodynamic parameters for last 24 hours: CVP:  [0 mmHg-19 mmHg] 19 mmHg  Intake/Output from previous day: 07/09 0701 - 07/10 0700 In: 2003.6 [P.O.:600; I.V.:1203.6; IV Piggyback:200] Out: 850 [Urine:720; Chest Tube:130] Intake/Output this shift:    Breath sounds clear Surgical incision clean and dry Tenderness Afebrile with a paced rhythm Some fluid retention-Lasix restarted  Lab Results:  Recent Labs  09/12/13 0400 09/13/13 0358  WBC 18.5* 17.2*  HGB 13.9 13.3  HCT 40.7 39.8  PLT 164 166   BMET:  Recent Labs  09/12/13 0400 09/13/13 0358  NA 137 135*  K 4.1 4.1  CL 98 97  CO2 25 25  GLUCOSE 137* 122*  BUN 37* 38*  CREATININE 2.33* 2.07*  CALCIUM 8.2* 8.2*    PT/INR: No results found for this basename: LABPROT, INR,  in the last 72 hours ABG    Component Value Date/Time   PHART 7.364 09/11/2013 0433   HCO3 23.3 09/11/2013 0433   TCO2 25 09/11/2013 0433   ACIDBASEDEF 2.0 09/11/2013 0433   O2SAT 56.8 09/13/2013 0357   CBG (last 3)  No results found for this basename: GLUCAP,  in the last 72 hours  Assessment/Plan: S/P Procedure(s) (LRB): MEDIASTINOTOMY CHAMBERLAIN MCNEIL (Right) Start diuresis Milrinone  per advanced heart failure team for nonischemic cardiomyopathy Check x-ray in a.m.   LOS: 3 days    [name],Kahla Risdon 09/13/2013

## 2013-09-14 ENCOUNTER — Inpatient Hospital Stay (HOSPITAL_COMMUNITY): Payer: 59

## 2013-09-14 LAB — COMPREHENSIVE METABOLIC PANEL
ALT: 18 U/L (ref 0–53)
AST: 29 U/L (ref 0–37)
Albumin: 2.7 g/dL — ABNORMAL LOW (ref 3.5–5.2)
Alkaline Phosphatase: 84 U/L (ref 39–117)
Anion gap: 15 (ref 5–15)
BUN: 40 mg/dL — ABNORMAL HIGH (ref 6–23)
CO2: 25 mEq/L (ref 19–32)
Calcium: 8.4 mg/dL (ref 8.4–10.5)
Chloride: 95 mEq/L — ABNORMAL LOW (ref 96–112)
Creatinine, Ser: 2.04 mg/dL — ABNORMAL HIGH (ref 0.50–1.35)
GFR calc Af Amer: 41 mL/min — ABNORMAL LOW (ref >90)
GFR calc non Af Amer: 35 mL/min — ABNORMAL LOW (ref >90)
Glucose, Bld: 103 mg/dL — ABNORMAL HIGH (ref 70–99)
Potassium: 3.8 mEq/L (ref 3.7–5.3)
Sodium: 135 mEq/L — ABNORMAL LOW (ref 137–147)
Total Bilirubin: 0.7 mg/dL (ref 0.3–1.2)
Total Protein: 6.8 g/dL (ref 6.0–8.3)

## 2013-09-14 LAB — CBC
HCT: 36.9 % — ABNORMAL LOW (ref 39.0–52.0)
Hemoglobin: 12.6 g/dL — ABNORMAL LOW (ref 13.0–17.0)
MCH: 29.1 pg (ref 26.0–34.0)
MCHC: 34.1 g/dL (ref 30.0–36.0)
MCV: 85.2 fL (ref 78.0–100.0)
Platelets: 174 10*3/uL (ref 150–400)
RBC: 4.33 MIL/uL (ref 4.22–5.81)
RDW: 14.1 % (ref 11.5–15.5)
WBC: 14.6 10*3/uL — ABNORMAL HIGH (ref 4.0–10.5)

## 2013-09-14 LAB — MAGNESIUM: Magnesium: 2 mg/dL (ref 1.5–2.5)

## 2013-09-14 LAB — CARBOXYHEMOGLOBIN
Carboxyhemoglobin: 1.7 % — ABNORMAL HIGH (ref 0.5–1.5)
Methemoglobin: 0.6 % (ref 0.0–1.5)
O2 Saturation: 82.4 %
Total hemoglobin: 11.1 g/dL — ABNORMAL LOW (ref 13.5–18.0)

## 2013-09-14 LAB — URIC ACID: Uric Acid, Serum: 8.1 mg/dL — ABNORMAL HIGH (ref 4.0–7.8)

## 2013-09-14 MED ORDER — SODIUM CHLORIDE 0.9 % IJ SOLN
10.0000 mL | INTRAMUSCULAR | Status: DC | PRN
  Administered 2013-09-14 – 2013-09-15 (×4): 10 mL

## 2013-09-14 NOTE — H&P (Signed)
I have personally seen and examined this patient and agree with the assessment/plan as outlined above by Denton Brick MD (PGY 2). Renal function unchanged at 2.0 overnight- continue milrinone per cardiology to maximize C.O. and improve renal perfusion with hopes of limiting CINP (20% AKI risk with 2% HD risk----assuming contrast load of <55mL). Hypervolemic on exam (EF 10-15%)- no role for IVFs  Petrina Melby K.,MD 09/14/2013 11:34 AM

## 2013-09-14 NOTE — Progress Notes (Signed)
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   He is intoelrant to numerous HF meds including spiro (severe hyperkalemia - requiring HD), hydralazine (fuzzy-headed) and imdur (severe HAs).   Recently found to have enlarging mediastinal mass now with compression of trachea and esophagus. Last year a transbronchial EBUS biopsy performed by pulmonary was nondiagnostic. He then had a mediastinoscopy which returned thyroid tissue. A thyroid scan subtotally showed some activity in the mediastinum and he underwent thyroid suppressive therapy by Dr. Buddy Duty without success and in fact the mass is enlarging. Because of concern this could relieve be a lymphoma PET scan was performed showing hypermetabolic activity as well as hypermetabolic activity of a left cervical lymph node. The lymph node was biopsied by IR and this showed lymphoid tissue but scant sample size, not enough to perform flow cytometry.   Admitted and underwent R chanberlain- bx mediastinal mass- c/w vascular malformation.   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 Recommended further imaging with contrast CT. Started on milrinone in an attempt to improve renal function. Cr stable 2.07->2.04. Diuretics restarted. Ambulating. + mild chest soreness.    Objective:   Weight Range:  Vital Signs:   Temp:  [97.4 F (36.3 C)-98.4 F (36.9 C)] 98.4 F (36.9 C) (07/11 0542) Pulse Rate:  [77-99] 82 (07/11 0542) Resp:  [18-25] 18 (07/11 0542) BP: (96-132)/(58-96) 132/73 mmHg (07/11 0542) SpO2:  [98 %-99 %] 99 % (07/11 0542) Weight:   [118.706 kg (261 lb 11.2 oz)] 118.706 kg (261 lb 11.2 oz) (07/11 0542) Last BM Date:  (prior to surgery)  Weight change: Filed Weights   09/12/13 0600 09/13/13 0600 09/14/13 0542  Weight: 116.847 kg (257 lb 9.6 oz) 118.6 kg (261 lb 7.5 oz) 118.706 kg (261 lb 11.2 oz)    Intake/Output:   Intake/Output Summary (Last 24 hours) at 09/14/13 0900 Last data filed at 09/14/13 0543  Gross per 24 hour  Intake 285.13 ml  Output   1025 ml  Net -739.87 ml     Physical Exam: General:  Sitting in chair No resp difficulty HEENT: normal Neck: supple. JVP 8; RIJ 2L central line; Carotids 2+ bilat; no bruits. No lymphadenopathy or thryomegaly appreciated. Cor: PMI nondisplaced. Regular rate & rhythm. No rubs, gallops or murmurs. Lungs: clear. Decreased at bases Abdomen: soft, nontender, + distended. Good bowel sounds. Extremities: no cyanosis, clubbing, rash, edema Neuro: alert & orientedx3, cranial nerves grossly intact. moves all 4 extremities w/o difficulty. Affect pleasant  Telemetry: SR  Labs: Basic Metabolic Panel:  Recent Labs Lab 09/11/13 0433 09/11/13 1532 09/12/13 0400 09/12/13 1016 09/13/13 0358 09/14/13 0428  NA 135* 136* 137  --  135* 135*  K 6.0* 4.4 4.1  --  4.1 3.8  CL 100 98 98  --  97 95*  CO2 20 24 25   --  25 25  GLUCOSE 137* 134* 137*  --  122* 103*  BUN 35* 36* 37*  --  38* 40*  CREATININE 2.48* 2.66* 2.33*  --  2.07* 2.04*  CALCIUM 8.3* 8.2* 8.2*  --  8.2* 8.4  MG  --   --   --  1.2*  --  2.0    Liver Function Tests:  Recent Labs Lab 09/09/13 1504 09/12/13 0400 09/13/13 0358 09/14/13 0428  AST 29 27 28 29   ALT 28 18 18 18   ALKPHOS 80 66 72 84  BILITOT 1.0 1.7* 1.1 0.7  PROT 7.9 6.8 6.9 6.8  ALBUMIN 3.7 2.9* 2.9* 2.7*   No results found for this basename: LIPASE, AMYLASE,  in the last 168 hours No results found for this basename: AMMONIA,  in the last 168 hours  CBC:  Recent Labs Lab 09/10/13 0542 09/11/13 0433 09/12/13 0400  09/13/13 0358 09/14/13 0428  WBC 15.3* 18.1* 18.5* 17.2* 14.6*  HGB 16.7 15.1 13.9 13.3 12.6*  HCT 48.6 44.8 40.7 39.8 36.9*  MCV 85.0 85.5 86.0 86.0 85.2  PLT 158 155 164 166 174    Cardiac Enzymes: No results found for this basename: CKTOTAL, CKMB, CKMBINDEX, TROPONINI,  in the last 168 hours  BNP: BNP (last 3 results) No results found for this basename: PROBNP,  in the last 8760 hours   Imaging: Dg Chest 2 View  09/14/2013   CLINICAL DATA:  Post right paratracheal mass biopsy  EXAM: CHEST  2 VIEW  COMPARISON:  09/13/2013; 09/12/2013; 09/11/2013; PET-CT - 07/23/2013; chest CT - 08/27/2013  FINDINGS: Grossly unchanged enlarged cardiac silhouette and mediastinal contours with grossly unchanged obscuration of the right paratracheal stripe secondary to known hypermetabolic right-sided middle / posterior mediastinal hypermetabolic mass with persistent mild leftward deviation of the tracheal air column. Interval removal of right-sided chest tube without development of a pneumothorax. Otherwise, stable positioning of support apparatus. Mild pulmonary venous congestion without frank evidence of edema. No new focal airspace opacities. Unchanged trace right-sided pleural effusion with minimal amount of fluid tracking within the right minor fissure. Unchanged bones. Post cholecystectomy.  IMPRESSION: 1. Interval removal of right-sided chest tube without development of a pneumothorax. Otherwise, stable position of remaining support apparatus. 2. Cardiomegaly and pulmonary venous congestion without frank evidence of edema. 3. Unchanged appearance of known right-sided middle / posterior mediastinal mass.   Electronically Signed   By: Sandi Mariscal M.D.   On: 09/14/2013 08:18   Dg Chest Port 1 View  09/13/2013   CLINICAL DATA:  Status post biopsy of right peritracheal mass.  EXAM: PORTABLE CHEST - 1 VIEW  COMPARISON:  One-view chest 09/12/2013  FINDINGS: The heart is enlarged. Pacing and AICD wires are in  place. A right IJ line is stable. A right-sided chest tube is stable. There is no significant pneumothorax. The right peritracheal mass is again noted. The heart obscures the left lower lobe. No significant airspace disease is present. The lung volumes are low.  IMPRESSION: 1. Stable cardiomegaly without failure. 2. Right peritracheal mass. 3. Support apparatus stable.  There is no significant pneumothorax.   Electronically Signed   By: Lawrence Santiago M.D.   On: 09/13/2013 07:37     Medications:     Scheduled Medications: . acetaminophen  1,000 mg Oral 4 times per day   Or  . acetaminophen (TYLENOL) oral liquid 160 mg/5 mL  1,000 mg Oral 4 times per day  . antiseptic oral rinse  15 mL Mouth Rinse q12n4p  . atorvastatin  40 mg Oral QHS  . bisacodyl  10 mg Oral Daily  . carvedilol  25 mg Oral BID WC  . cefTAZidime (FORTAZ)  IV  1 g Intravenous Q12H  . chlorhexidine  15 mL Mouth Rinse BID  . digoxin  0.125 mg Oral Daily  .  enoxaparin (LOVENOX) injection  30 mg Subcutaneous Q24H  . febuxostat  40 mg Oral Daily  . furosemide  40 mg Oral Daily  . gabapentin  200 mg Oral BID  . guaiFENesin  600 mg Oral BID  . levothyroxine  150 mcg Oral QAC breakfast  . magnesium oxide  400 mg Oral Daily  . senna-docusate  1 tablet Oral QHS  . sodium chloride  10-20 mL Intravenous Q12H  . sodium chloride  3 mL Intravenous Q12H  . tobramycin-dexamethasone   Right Eye TID    Infusions: . milrinone 0.25 mcg/kg/min (09/13/13 2243)    PRN Medications: colchicine, fentaNYL, oxyCODONE, sodium chloride, sodium chloride, sodium chloride   Assessment:   1) Chronic systolic HF - EF 16% (05/8464) 2) NICM - s/p Medtronic ICD 3) Mediastinal mass c/w vascular malformation 4) Acute on chronic renal failure, stage IV 5) Bladder Cancer 6) Hypomagnesemia  Plan/Discussion:    He is stable post-op. Weight up 6 pounds post-op. Now back on lasix. Started on milrinone in an attempt to maximize cardiac output and  renal function to permit further contrasted imaging of mediastinal mass (hibernoma vs obstructed azygous). Though I am not sure we will see much improvement. Cr was 1.9 in 4/15 but 1.6 on 7/6 (was 2.3  On 7/7) so he may be close to baseline. Will continue milrinone at least one more day to see what happens.   Magnesium supplemented. Now 2.0 Check dig level in am.   If Cr improves to 1.5 or better will need chest CT with contrast and IV in both arms with contrast injection bilaterally to assess venous flow to chest.   Quillian Quince Consuela Widener,MD 9:00 AM

## 2013-09-14 NOTE — H&P (Signed)
S: Pt coughing, slight tinge of blood. Pt requesting to have uric acid checked, feels his right knee is beginning to ache, like it does when he is having a gout flare. Still feels hsi abdomen is a little fuller than usual, no significant change since taking his home lasix yesterday.   O:BP 132/73  Pulse 82  Temp(Src) 98.4 F (36.9 C) (Oral)  Resp 18  Ht 5\' 10"  (1.778 m)  Wt 261 lb 11.2 oz (118.706 kg)  BMI 37.55 kg/m2  SpO2 99%  Intake/Output Summary (Last 24 hours) at 09/14/13 0837 Last data filed at 09/14/13 0543  Gross per 24 hour  Intake 455.13 ml  Output   1125 ml  Net -669.87 ml   Intake/Output: I/O last 3 completed shifts: In: 1155.1 [P.O.:340; I.V.:715.1; IV Piggyback:100] Out: 2229 [Urine:1405; Chest Tube:80]  Intake/Output this shift:    Weight change: 3.8 oz (0.106 kg)  Physical Exam Gen:NAD, sitting in recliner at bedside. Heent: AT. [place], moist oral mucosa. CVS: Regular rate and rhythm Resp:Clear to auscultation bilat Abd: Full, soft, non tender. NLG:XQJJH, yesterday has improved, minimal pitting pedal edema to mid leg. Neuro- Alert and oriented.   Recent Labs Lab 09/09/13 1504 09/10/13 1749 09/11/13 0433 09/11/13 1532 09/12/13 0400 09/13/13 0358 09/14/13 0428  NA 136* 138 135* 136* 137 135* 135*  K 5.2 4.4 6.0* 4.4 4.1 4.1 3.8  CL 101 100 100 98 98 97 95*  CO2 16* 23 20 24 25 25 25   GLUCOSE 93 123* 137* 134* 137* 122* 103*  BUN 25* 32* 35* 36* 37* 38* 40*  CREATININE 1.59* 2.31* 2.48* 2.66* 2.33* 2.07* 2.04*  ALBUMIN 3.7  --   --   --  2.9* 2.9* 2.7*  CALCIUM 9.4 8.1* 8.3* 8.2* 8.2* 8.2* 8.4  AST 29  --   --   --  27 28 29   ALT 28  --   --   --  18 18 18    Liver Function Tests:  Recent Labs Lab 09/12/13 0400 09/13/13 0358 09/14/13 0428  AST 27 28 29   ALT 18 18 18   ALKPHOS 66 72 84  BILITOT 1.7* 1.1 0.7  PROT 6.8 6.9 6.8  ALBUMIN 2.9* 2.9* 2.7*   No results found for this basename: LIPASE, AMYLASE,  in the last 168 hours No results  found for this basename: AMMONIA,  in the last 168 hours CBC:  Recent Labs Lab 09/10/13 0542 09/11/13 0433 09/12/13 0400 09/13/13 0358 09/14/13 0428  WBC 15.3* 18.1* 18.5* 17.2* 14.6*  HGB 16.7 15.1 13.9 13.3 12.6*  HCT 48.6 44.8 40.7 39.8 36.9*  MCV 85.0 85.5 86.0 86.0 85.2  PLT 158 155 164 166 174   Studies/Results: Dg Chest 2 View  09/14/2013   CLINICAL DATA:  Post right paratracheal mass biopsy  EXAM: CHEST  2 VIEW  COMPARISON:  09/13/2013; 09/12/2013; 09/11/2013; PET-CT - 07/23/2013; chest CT - 08/27/2013  FINDINGS: Grossly unchanged enlarged cardiac silhouette and mediastinal contours with grossly unchanged obscuration of the right paratracheal stripe secondary to known hypermetabolic right-sided middle / posterior mediastinal hypermetabolic mass with persistent mild leftward deviation of the tracheal air column. Interval removal of right-sided chest tube without development of a pneumothorax. Otherwise, stable positioning of support apparatus. Mild pulmonary venous congestion without frank evidence of edema. No new focal airspace opacities. Unchanged trace right-sided pleural effusion with minimal amount of fluid tracking within the right minor fissure. Unchanged bones. Post cholecystectomy.  IMPRESSION: 1. Interval removal of right-sided chest tube without development  of a pneumothorax. Otherwise, stable position of remaining support apparatus. 2. Cardiomegaly and pulmonary venous congestion without frank evidence of edema. 3. Unchanged appearance of known right-sided middle / posterior mediastinal mass.   Electronically Signed   By: Sandi Mariscal M.D.   On: 09/14/2013 08:18   Dg Chest Port 1 View  09/13/2013   CLINICAL DATA:  Status post biopsy of right peritracheal mass.  EXAM: PORTABLE CHEST - 1 VIEW  COMPARISON:  One-view chest 09/12/2013  FINDINGS: The heart is enlarged. Pacing and AICD wires are in place. A right IJ line is stable. A right-sided chest tube is stable. There is no  significant pneumothorax. The right peritracheal mass is again noted. The heart obscures the left lower lobe. No significant airspace disease is present. The lung volumes are low.  IMPRESSION: 1. Stable cardiomegaly without failure. 2. Right peritracheal mass. 3. Support apparatus stable.  There is no significant pneumothorax.   Electronically Signed   By: Lawrence Santiago M.D.   On: 09/13/2013 07:37   . acetaminophen  1,000 mg Oral 4 times per day   Or  . acetaminophen (TYLENOL) oral liquid 160 mg/5 mL  1,000 mg Oral 4 times per day  . antiseptic oral rinse  15 mL Mouth Rinse q12n4p  . atorvastatin  40 mg Oral QHS  . bisacodyl  10 mg Oral Daily  . carvedilol  25 mg Oral BID WC  . cefTAZidime (FORTAZ)  IV  1 g Intravenous Q12H  . chlorhexidine  15 mL Mouth Rinse BID  . digoxin  0.125 mg Oral Daily  . enoxaparin (LOVENOX) injection  30 mg Subcutaneous Q24H  . febuxostat  40 mg Oral Daily  . furosemide  40 mg Oral Daily  . gabapentin  200 mg Oral BID  . guaiFENesin  600 mg Oral BID  . levothyroxine  150 mcg Oral QAC breakfast  . magnesium oxide  400 mg Oral Daily  . senna-docusate  1 tablet Oral QHS  . sodium chloride  10-20 mL Intravenous Q12H  . sodium chloride  3 mL Intravenous Q12H  . tobramycin-dexamethasone   Right Eye TID    BMET    Component Value Date/Time   NA 135* 09/14/2013 0428   K 3.8 09/14/2013 0428   CL 95* 09/14/2013 0428   CO2 25 09/14/2013 0428   GLUCOSE 103* 09/14/2013 0428   BUN 40* 09/14/2013 0428   CREATININE 2.04* 09/14/2013 0428   CREATININE 1.76* 06/28/2012 1228   CALCIUM 8.4 09/14/2013 0428   GFRNONAA 35* 09/14/2013 0428   GFRAA 41* 09/14/2013 0428   CBC    Component Value Date/Time   WBC 14.6* 09/14/2013 0428   WBC 31.4* 06/28/2012 1259   RBC 4.33 09/14/2013 0428   RBC 5.74 06/28/2012 1259   HGB 12.6* 09/14/2013 0428   HGB 16.8 06/28/2012 1259   HCT 36.9* 09/14/2013 0428   HCT 51.4 06/28/2012 1259   PLT 174 09/14/2013 0428   MCV 85.2 09/14/2013 0428   MCV 89.6  06/28/2012 1259   MCH 29.1 09/14/2013 0428   MCH 29.3 06/28/2012 1259   MCHC 34.1 09/14/2013 0428   MCHC 32.7 06/28/2012 1259   RDW 14.1 09/14/2013 0428   LYMPHSABS 2.1 06/29/2012 0540   MONOABS 2.7* 06/29/2012 0540   EOSABS 0.0 06/29/2012 0540   BASOSABS 0.0 06/29/2012 0540     Assessment/Plan:  1. Acute on chronic kidney injury- Resolving acute component. Cr today- 2.04, trending down to baseline- 1.5- 1.9. Mild fluid overload, on lasix  40mg  daily. Urine out put yesterday- 1149mls, net neg- 619. Fluid overload, likely from CHF and CKD. Weight up, 251- 09/04/2013, and is 261 today. Planned for possible contrast Ct, on milrinone to increase renal perfusion, as pt might not tolerate prehydration due to low EF. - Continue lasix 40 daily and milrinone.   2. Leukocytosis- trending down- 14.6 today, without fever. ? Source of infection. ? Stress reaction ?UTI   3. Hx of HTN, but Bp presently soft, on milrinone.   4. 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   5. CHF- EF- 15%- Management per cards.   6. Hypomagnesemia- 1.2 today. On daily suplimentation of Mag at home- continued on admission. Supplimented with IV Mag 4g yesterday.   7. Hx of Gout- Home meds febuxostat and colchicine. Febuxostat while on admission. Pt request to have uric acid checked.  Kirkland Hun, PGY- 2

## 2013-09-14 NOTE — Progress Notes (Signed)
Pt can place on home mask when ready. PT encouraged to call RT if needing any assistance

## 2013-09-14 NOTE — Consult Note (Signed)
Late entry for patient's seen together with the resident yesterday at 4:45 PM (09/13/2013)  I have personally seen and examined this patient and agree with the assessment/plan as outlined above by Denton Brick MD (PGY 2).  Patient here for surgical management of a mediastinal mass and found to have a vascular malformation in his mediastinum. He suffered some acute renal failure on chronic kidney disease stage III renal function has improved and he appears to be close to his baseline. It is anticipated that he may need contrast exposure for CT angiogram of his chest/mediastinum. He is not on any RAS blocking agents and not getting any nonsteroidal anti-inflammatory drugs. From a hemodynamic standpoint, he has well controlled blood pressures and was started on milrinone for optimization of cardiac output (EF 77-11% with diastolic dysfunction as well) and attempts at improving renal perfusion. Clinically, he has mild volume excess and was started on alternate day furosemide.  He cannot get the usual "contrast prophylaxis" measures with intravenous fluids prior to CT angiogram due to high propensity for CHF exacerbation/respiratory failure. I agree with Dr. Clayborne Dana attempts at increasing renal perfusion with improved cardiac output-milrinone. Based on risk score, his risk for acute renal failure post contrast exposure is about 20% and risk for dialysis is 2%. I explained this in its entirety to the patient and he voices understanding regarding the risks and benefits of the procedure.   Asad Keeven K.,MD 09/14/2013 7:12 AM

## 2013-09-14 NOTE — Progress Notes (Addendum)
      [address]       [place] [address]             [phone]      4 Days Post-Op Procedure(s) (LRB): MEDIASTINOTOMY CHAMBERLAIN MCNEIL (Right)  Subjective:  Mr. Ohms complains of some incisional pain with cough.  He states he has been able to expectorate some sputum and he feels like he is able to breath better after that.  He is ambulating.  + BM  Objective: Vital signs in last 24 hours: Temp:  [97.4 F (36.3 C)-98.4 F (36.9 C)] 98.4 F (36.9 C) (07/11 0542) Pulse Rate:  [77-99] 82 (07/11 0542) Cardiac Rhythm:  [-] A-V Sequential paced (07/10 2056) Resp:  [18-25] 18 (07/11 0542) BP: (96-132)/(58-96) 132/73 mmHg (07/11 0542) SpO2:  [98 %-99 %] 99 % (07/11 0542) Weight:  [261 lb 11.2 oz (118.706 kg)] 261 lb 11.2 oz (118.706 kg) (07/11 0542)  Hemodynamic parameters for last 24 hours: CVP:  [19 mmHg] 19 mmHg  Intake/Output from previous day: 07/10 0701 - 07/11 0700 In: 505.1 [P.O.:340; I.V.:115.1; IV Piggyback:50] Out: 1125 [QJFHL:[id number]]  General appearance: alert, cooperative and no distress Heart: regular rate and rhythm Lungs: clear to auscultation bilaterally Abdomen: soft, non-tender; bowel sounds normal; no masses,  no organomegaly Wound: clean and dry  Lab Results:  Recent Labs  09/13/13 0358 09/14/13 0428  WBC 17.2* 14.6*  HGB 13.3 12.6*  HCT 39.8 36.9*  PLT 166 174   BMET:  Recent Labs  09/13/13 0358 09/14/13 0428  NA 135* 135*  K 4.1 3.8  CL 97 95*  CO2 25 25  GLUCOSE 122* 103*  BUN 38* 40*  CREATININE 2.07* 2.04*  CALCIUM 8.2* 8.4    PT/INR: No results found for this basename: LABPROT, INR,  in the last 72 hours ABG    Component Value Date/Time   PHART 7.364 09/11/2013 0433   HCO3 23.3 09/11/2013 0433   TCO2 25 09/11/2013 0433   ACIDBASEDEF 2.0 09/11/2013 0433   O2SAT 82.4 09/14/2013 0559   CBG (last 3)  No results found for this basename: GLUCAP,  in the last 72 hours  Assessment/Plan: S/P Procedure(s)  (LRB): MEDIASTINOTOMY CHAMBERLAIN MCNEIL (Right)  1. Chest tube- removed yesterday, CXR without pneumothorax, otherwise stable in appearance 2. CV- Ischemic Cardiomyopathy, advance HF team following, currently on Milrinone 3. Renal- CKD Stage 4, creatinine down to 2.04 continue to monitor 4. Mediastinal Mass- stable 5. Dispo- continue current care   LOS: 4 days    BARRETT, ERIN 09/14/2013  Chest x-ray clear Chest incision clean dry Cardiac function adequate-co-ox normal Chest CT scan with IV contrast is planned probably early next week

## 2013-09-15 LAB — POCT I-STAT 7, (LYTES, BLD GAS, ICA,H+H)
Acid-base deficit: 5 mmol/L — ABNORMAL HIGH (ref 0.0–2.0)
Acid-base deficit: 4 mmol/L — ABNORMAL HIGH (ref 0.0–2.0)
Bicarbonate: 23.4 mEq/L (ref 20.0–24.0)
Bicarbonate: 24.4 mEq/L — ABNORMAL HIGH (ref 20.0–24.0)
Calcium, Ion: 1.21 mmol/L (ref 1.12–1.23)
Calcium, Ion: 1.21 mmol/L (ref 1.12–1.23)
HCT: 53 % — ABNORMAL HIGH (ref 39.0–52.0)
HCT: 50 % (ref 39.0–52.0)
Hemoglobin: 18 g/dL — ABNORMAL HIGH (ref 13.0–17.0)
Hemoglobin: 17 g/dL (ref 13.0–17.0)
O2 Saturation: 95 %
O2 Saturation: 100 %
Patient temperature: 35.6
Patient temperature: 36.5
Potassium: 6.5 mEq/L (ref 3.7–5.3)
Potassium: 5.3 mEq/L (ref 3.7–5.3)
Sodium: 133 mEq/L — ABNORMAL LOW (ref 137–147)
Sodium: 135 mEq/L — ABNORMAL LOW (ref 137–147)
TCO2: 25 mmol/L (ref 0–100)
TCO2: 26 mmol/L (ref 0–100)
pCO2 arterial: 50.5 mmHg — ABNORMAL HIGH (ref 35.0–45.0)
pCO2 arterial: 55.1 mmHg — ABNORMAL HIGH (ref 35.0–45.0)
pH, Arterial: 7.266 — ABNORMAL LOW (ref 7.350–7.450)
pH, Arterial: 7.252 — ABNORMAL LOW (ref 7.350–7.450)
pO2, Arterial: 84 mmHg (ref 80.0–100.0)
pO2, Arterial: 245 mmHg — ABNORMAL HIGH (ref 80.0–100.0)

## 2013-09-15 LAB — COMPREHENSIVE METABOLIC PANEL
ALT: 18 U/L (ref 0–53)
AST: 28 U/L (ref 0–37)
Albumin: 2.5 g/dL — ABNORMAL LOW (ref 3.5–5.2)
Alkaline Phosphatase: 73 U/L (ref 39–117)
Anion gap: 13 (ref 5–15)
BUN: 36 mg/dL — ABNORMAL HIGH (ref 6–23)
CO2: 27 mEq/L (ref 19–32)
Calcium: 8.2 mg/dL — ABNORMAL LOW (ref 8.4–10.5)
Chloride: 95 mEq/L — ABNORMAL LOW (ref 96–112)
Creatinine, Ser: 1.79 mg/dL — ABNORMAL HIGH (ref 0.50–1.35)
GFR calc Af Amer: 48 mL/min — ABNORMAL LOW (ref >90)
GFR calc non Af Amer: 41 mL/min — ABNORMAL LOW (ref >90)
Glucose, Bld: 92 mg/dL (ref 70–99)
Potassium: 3.6 mEq/L — ABNORMAL LOW (ref 3.7–5.3)
Sodium: 135 mEq/L — ABNORMAL LOW (ref 137–147)
Total Bilirubin: 0.5 mg/dL (ref 0.3–1.2)
Total Protein: 6.3 g/dL (ref 6.0–8.3)

## 2013-09-15 LAB — CBC
HCT: 34.9 % — ABNORMAL LOW (ref 39.0–52.0)
Hemoglobin: 11.8 g/dL — ABNORMAL LOW (ref 13.0–17.0)
MCH: 28.5 pg (ref 26.0–34.0)
MCHC: 33.8 g/dL (ref 30.0–36.0)
MCV: 84.3 fL (ref 78.0–100.0)
Platelets: 176 10*3/uL (ref 150–400)
RBC: 4.14 MIL/uL — ABNORMAL LOW (ref 4.22–5.81)
RDW: 13.8 % (ref 11.5–15.5)
WBC: 12.3 10*3/uL — ABNORMAL HIGH (ref 4.0–10.5)

## 2013-09-15 LAB — CARBOXYHEMOGLOBIN
Carboxyhemoglobin: 1.6 % — ABNORMAL HIGH (ref 0.5–1.5)
Methemoglobin: 0.8 % (ref 0.0–1.5)
O2 Saturation: 60.4 %
Total hemoglobin: 11.3 g/dL — ABNORMAL LOW (ref 13.5–18.0)

## 2013-09-15 LAB — DIGOXIN LEVEL: Digoxin Level: 0.4 ng/mL — ABNORMAL LOW (ref 0.8–2.0)

## 2013-09-15 MED ORDER — ACETAMINOPHEN 160 MG/5ML PO SOLN
1000.0000 mg | Freq: Four times a day (QID) | ORAL | Status: DC
  Administered 2013-09-15 – 2013-09-18 (×8): 1000 mg via ORAL
  Filled 2013-09-15 (×3): qty 40
  Filled 2013-09-15 (×2): qty 40.6
  Filled 2013-09-15: qty 40
  Filled 2013-09-15 (×2): qty 40.6
  Filled 2013-09-15: qty 40

## 2013-09-15 MED ORDER — PHENOL 1.4 % MT LIQD
1.0000 | OROMUCOSAL | Status: DC | PRN
  Administered 2013-09-16: 1 via OROMUCOSAL
  Filled 2013-09-15: qty 177

## 2013-09-15 MED ORDER — OXYCODONE HCL 5 MG/5ML PO SOLN: 5.0000 mg | ORAL | Status: DC | PRN

## 2013-09-15 MED ORDER — PREDNISONE 20 MG PO TABS
40.0000 mg | ORAL_TABLET | Freq: Every day | ORAL | Status: DC
  Filled 2013-09-15 (×2): qty 2

## 2013-09-15 MED ORDER — ACETAMINOPHEN 500 MG PO TABS
1000.0000 mg | ORAL_TABLET | Freq: Four times a day (QID) | ORAL | Status: DC
  Administered 2013-09-16 – 2013-09-18 (×3): 1000 mg via ORAL
  Filled 2013-09-15 (×12): qty 2

## 2013-09-15 MED ORDER — METHYLPREDNISOLONE SODIUM SUCC 40 MG IJ SOLR
40.0000 mg | Freq: Every day | INTRAMUSCULAR | Status: DC
  Administered 2013-09-15 – 2013-09-16 (×2): 40 mg via INTRAVENOUS
  Filled 2013-09-15 (×3): qty 1

## 2013-09-15 MED ORDER — ACETAMINOPHEN 160 MG/5ML PO SOLN
325.0000 mg | ORAL | Status: DC | PRN
  Filled 2013-09-15: qty 10.2

## 2013-09-15 NOTE — Progress Notes (Signed)
Patient declined his walk this morning and stated that "I will be unable to walk today due to my gout flare". Will ask again after lunch. Glade Nurse, RN

## 2013-09-15 NOTE — Progress Notes (Signed)
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   He is intoelrant to numerous HF meds including spiro (severe hyperkalemia - requiring HD), hydralazine (fuzzy-headed) and imdur (severe HAs).   Recently found to have enlarging mediastinal mass now with compression of trachea and esophagus. Last year a transbronchial EBUS biopsy performed by pulmonary was nondiagnostic. He then had a mediastinoscopy which returned thyroid tissue. A thyroid scan subtotally showed some activity in the mediastinum and he underwent thyroid suppressive therapy by Dr. Buddy Duty without success and in fact the mass is enlarging. Because of concern this could relieve be a lymphoma PET scan was performed showing hypermetabolic activity as well as hypermetabolic activity of a left cervical lymph node. The lymph node was biopsied by IR and this showed lymphoid tissue but scant sample size, not enough to perform flow cytometry.   Admitted and underwent R chanberlain- bx mediastinal mass- c/w vascular malformation.   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 Recommended further imaging with contrast CT. Started on milrinone in an attempt to improve renal function.   Cr improving 2.07->2.04->1.79. Diuretics restarted. C/o severe gout in R knee. Feels like throat may be closing but can swallow and is not hoarse.    Objective:   Weight Range:  Vital Signs:   Temp:  [98.5 F (36.9 C)-99.8 F (37.7 C)] 99.1 F (37.3 C) (07/12 0559) Pulse Rate:  [80-87] 83 (07/12 0559) Resp:  [18-20] 20 (07/12 0559) BP:  (121-131)/(71-75) 131/71 mmHg (07/12 0559) SpO2:  [99 %-100 %] 99 % (07/12 0559) Weight:  [118.2 kg (260 lb 9.3 oz)] 118.2 kg (260 lb 9.3 oz) (07/12 0559) Last BM Date: 09/14/13  Weight change: Filed Weights   09/13/13 0600 09/14/13 0542 09/15/13 0559  Weight: 118.6 kg (261 lb 7.5 oz) 118.706 kg (261 lb 11.2 oz) 118.2 kg (260 lb 9.3 oz)    Intake/Output:   Intake/Output Summary (Last 24 hours) at 09/15/13 0705 Last data filed at 09/15/13 2725  Gross per 24 hour  Intake 582.47 ml  Output   1251 ml  Net -668.53 ml     Physical Exam: General:  Sitting in chair No resp difficulty HEENT: normal Neck: supple. JVP 8; RIJ 2L central line; Carotids 2+ bilat; no bruits. No lymphadenopathy or thryomegaly appreciated. Cor: PMI nondisplaced. Regular rate & rhythm. No rubs, gallops or murmurs. Lungs: clear. Decreased at bases Abdomen: soft, nontender, non distended. Good bowel sounds. Extremities: no cyanosis, clubbing, rash, tr edema R knee warm and tender to touch Neuro: alert & orientedx3, cranial nerves grossly intact. moves all 4 extremities w/o difficulty. Affect pleasant  Telemetry: SR  Labs: Basic Metabolic Panel:  Recent Labs Lab 09/11/13 1532 09/12/13 0400 09/12/13 1016 09/13/13 0358 09/14/13 0428 09/15/13 0500  NA 136* 137  --  135* 135* 135*  K 4.4 4.1  --  4.1 3.8 3.6*  CL 98 98  --  97 95* 95*  CO2 24 25  --  25 25 27   GLUCOSE 134* 137*  --  122* 103* 92  BUN 36* 37*  --  38* 40* 36*  CREATININE 2.66* 2.33*  --  2.07* 2.04* 1.79*  CALCIUM 8.2* 8.2*  --  8.2* 8.4 8.2*  MG  --   --  1.2*  --  2.0  --     Liver Function Tests:  Recent Labs Lab 09/09/13 1504 09/12/13 0400 09/13/13 0358 09/14/13 0428 09/15/13 0500  AST 29 27 28 29 28   ALT 28 18 18 18 18   ALKPHOS 80 66 72 84 73  BILITOT 1.0 1.7* 1.1 0.7 0.5  PROT 7.9 6.8 6.9 6.8 6.3  ALBUMIN 3.7 2.9* 2.9* 2.7* 2.5*   No results found for this basename: LIPASE, AMYLASE,  in the last 168 hours No  results found for this basename: AMMONIA,  in the last 168 hours  CBC:  Recent Labs Lab 09/11/13 0433 09/12/13 0400 09/13/13 0358 09/14/13 0428 09/15/13 0500  WBC 18.1* 18.5* 17.2* 14.6* 12.3*  HGB 15.1 13.9 13.3 12.6* 11.8*  HCT 44.8 40.7 39.8 36.9* 34.9*  MCV 85.5 86.0 86.0 85.2 84.3  PLT 155 164 166 174 176    Cardiac Enzymes: No results found for this basename: CKTOTAL, CKMB, CKMBINDEX, TROPONINI,  in the last 168 hours  BNP: BNP (last 3 results) No results found for this basename: PROBNP,  in the last 8760 hours   Imaging: Dg Chest 2 View  09/14/2013   CLINICAL DATA:  Post right paratracheal mass biopsy  EXAM: CHEST  2 VIEW  COMPARISON:  09/13/2013; 09/12/2013; 09/11/2013; PET-CT - 07/23/2013; chest CT - 08/27/2013  FINDINGS: Grossly unchanged enlarged cardiac silhouette and mediastinal contours with grossly unchanged obscuration of the right paratracheal stripe secondary to known hypermetabolic right-sided middle / posterior mediastinal hypermetabolic mass with persistent mild leftward deviation of the tracheal air column. Interval removal of right-sided chest tube without development of a pneumothorax. Otherwise, stable positioning of support apparatus. Mild pulmonary venous congestion without frank evidence of edema. No new focal airspace opacities. Unchanged trace right-sided pleural effusion with minimal amount of fluid tracking within the right minor fissure. Unchanged bones. Post cholecystectomy.  IMPRESSION: 1. Interval removal of right-sided chest tube without development of a pneumothorax. Otherwise, stable position of remaining support apparatus. 2. Cardiomegaly and pulmonary venous congestion without frank evidence of edema. 3. Unchanged appearance of known right-sided middle / posterior mediastinal mass.   Electronically Signed   By: Sandi Mariscal M.D.   On: 09/14/2013 08:18     Medications:     Scheduled Medications: . acetaminophen  1,000 mg Oral 4 times per day    Or  . acetaminophen (TYLENOL) oral liquid 160 mg/5 mL  1,000 mg Oral 4 times per day  . antiseptic oral rinse  15 mL Mouth Rinse q12n4p  . atorvastatin  40 mg Oral QHS  . bisacodyl  10 mg Oral Daily  . carvedilol  25 mg Oral BID WC  . cefTAZidime (FORTAZ)  IV  1 g Intravenous Q12H  . chlorhexidine  15 mL Mouth Rinse BID  . digoxin  0.125 mg Oral Daily  . enoxaparin (LOVENOX) injection  30 mg Subcutaneous Q24H  . febuxostat  40 mg Oral Daily  . furosemide  40 mg Oral Daily  . gabapentin  200 mg Oral BID  . guaiFENesin  600 mg Oral BID  . levothyroxine  150 mcg Oral QAC breakfast  . magnesium oxide  400 mg Oral Daily  . senna-docusate  1 tablet Oral QHS  . sodium chloride  10-20 mL Intravenous Q12H  . sodium chloride  3 mL Intravenous Q12H  . tobramycin-dexamethasone   Right Eye TID  Infusions: . milrinone 0.25 mcg/kg/min (09/14/13 2334)    PRN Medications: colchicine, fentaNYL, oxyCODONE, sodium chloride, sodium chloride, sodium chloride   Assessment:   1) Chronic systolic HF - EF 93% (10/1015) 2) NICM - s/p Medtronic ICD 3) Mediastinal mass c/w vascular malformation 4) Acute on chronic renal failure, stage IV 5) Bladder Cancer 6) Hypomagnesemia 7) Acute gout.   Plan/Discussion:    He is stable post-op. Weight startting to come back down. Now back on lasix. Started on milrinone in an attempt to maximize cardiac output and renal function to permit further contrasted imaging of mediastinal mass (hibernoma vs obstructed azygous). Renal function now improving. If gets to 1.5 tomorrow , I think we can scan with chest CT with contrast and IV in both arms with contrast injection bilaterally to assess venous flow to chest. (Discussed with Dr. Ronny Bacon in IR)   Magnesium supplemented. Now 2.0. Dig 0.4  Start prednisone for gout. Watch for swallowing difficulties post-op. Dr. Prescott Gum to see later this am.   Benay Spice 7:05 AM

## 2013-09-15 NOTE — Progress Notes (Signed)
Patient refused to walk tonight. He stated that due to his gout flare he was unable to walk all day and he would try again tomorrow.   Domingo Dimes RN

## 2013-09-15 NOTE — Progress Notes (Signed)
Patient ID: [name], male   DOB: May 08, 1960, 53 y.o.   MRN: [id number]  [facility] Progress Note    Assessment/ Plan:   1. Acute on chronic kidney injury- renal function improving on a combination of furosemide and milrinone (milrinone was started to help optimize cardiac output and improve renal perfusion). Anticipated contrast exposure with 20% risk of AKI and 2% risk of hemodialysis (cannot get aggressive intravenous fluids given 10-15% ejection fraction/diastolic dysfunction and current volume status). No acute electrolyte concerns and fair urine output noted. 2. Leukocytosis- does not appear to have any focus of infection, anticipate initiation of prednisone will cause further rise of WBCs 3. Hx of HTN, except for blood pressures at this point, continue to monitor (on furosemide and carvedilol)  4. 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 Expected to undergo CT angiogram to study mediastinal vasculature 5. CHF- EF- 15%- Management per cards.  6. Hypomagnesemia-  levels replete, recheck with labs tomorrow 7. Gout- on Uloric and colchicine, started on prednisone for acute gout flare.  Subjective:   Reports to be in discomfort from acute gout attack-otherwise unremarkable overnight. Reports a dry throat and difficulty swallowing this morning    Objective:   BP 131/71  Pulse 83  Temp(Src) 99.1 F (37.3 C) (Oral)  Resp 20  Ht 5\' 10"  (1.778 m)  Wt 118.2 kg (260 lb 9.3 oz)  BMI 37.39 kg/m2  SpO2 99%  Intake/Output Summary (Last 24 hours) at 09/15/13 0093 Last data filed at 09/15/13 8182  Gross per 24 hour  Intake 582.47 ml  Output   1251 ml  Net -668.53 ml   Weight change: -0.506 kg (-1 lb 1.9 oz)  Physical Exam: Gen: Uncomfortably resting in bed CVS: Pulse regular in rate and rhythm, S1 and S2 normal Resp: Decreased breath sounds over bases otherwise clear to auscultation, no rales Abd: Soft,  obese, nontender Ext: One plus lower extremity edema  Imaging: Dg Chest 2 View  09/14/2013   CLINICAL DATA:  Post right paratracheal mass biopsy  EXAM: CHEST  2 VIEW  COMPARISON:  09/13/2013; 09/12/2013; 09/11/2013; PET-CT - 07/23/2013; chest CT - 08/27/2013  FINDINGS: Grossly unchanged enlarged cardiac silhouette and mediastinal contours with grossly unchanged obscuration of the right paratracheal stripe secondary to known hypermetabolic right-sided middle / posterior mediastinal hypermetabolic mass with persistent mild leftward deviation of the tracheal air column. Interval removal of right-sided chest tube without development of a pneumothorax. Otherwise, stable positioning of support apparatus. Mild pulmonary venous congestion without frank evidence of edema. No new focal airspace opacities. Unchanged trace right-sided pleural effusion with minimal amount of fluid tracking within the right minor fissure. Unchanged bones. Post cholecystectomy.  IMPRESSION: 1. Interval removal of right-sided chest tube without development of a pneumothorax. Otherwise, stable position of remaining support apparatus. 2. Cardiomegaly and pulmonary venous congestion without frank evidence of edema. 3. Unchanged appearance of known right-sided middle / posterior mediastinal mass.   Electronically Signed   By: Sandi Mariscal M.D.   On: 09/14/2013 08:18    Labs: BMET  Recent Labs Lab 09/10/13 1749 09/11/13 0433 09/11/13 1532 09/12/13 0400 09/13/13 0358 09/14/13 0428 09/15/13 0500  NA 138 135* 136* 137 135* 135* 135*  K 4.4 6.0* 4.4 4.1 4.1 3.8 3.6*  CL 100 100 98 98 97 95* 95*  CO2 23 20 24 25 25 25 27   GLUCOSE 123* 137* 134* 137* 122* 103* 92  BUN 32* 35* 36* 37* 38* 40* 36*  CREATININE 2.31* 2.48* 2.66* 2.33* 2.07* 2.04* 1.79*  CALCIUM 8.1* 8.3* 8.2* 8.2* 8.2* 8.4 8.2*   CBC  Recent Labs Lab 09/12/13 0400 09/13/13 0358 09/14/13 0428 09/15/13 0500  WBC 18.5* 17.2* 14.6* 12.3*  HGB 13.9 13.3 12.6* 11.8*   HCT 40.7 39.8 36.9* 34.9*  MCV 86.0 86.0 85.2 84.3  PLT 164 166 174 176    Medications:    . acetaminophen  1,000 mg Oral 4 times per day   Or  . acetaminophen (TYLENOL) oral liquid 160 mg/5 mL  1,000 mg Oral 4 times per day  . acetaminophen  1,000 mg Oral 4 times per day   Or  . acetaminophen (TYLENOL) oral liquid 160 mg/5 mL  1,000 mg Oral 4 times per day  . antiseptic oral rinse  15 mL Mouth Rinse q12n4p  . atorvastatin  40 mg Oral QHS  . bisacodyl  10 mg Oral Daily  . carvedilol  25 mg Oral BID WC  . cefTAZidime (FORTAZ)  IV  1 g Intravenous Q12H  . chlorhexidine  15 mL Mouth Rinse BID  . digoxin  0.125 mg Oral Daily  . enoxaparin (LOVENOX) injection  30 mg Subcutaneous Q24H  . febuxostat  40 mg Oral Daily  . furosemide  40 mg Oral Daily  . gabapentin  200 mg Oral BID  . guaiFENesin  600 mg Oral BID  . levothyroxine  150 mcg Oral QAC breakfast  . magnesium oxide  400 mg Oral Daily  . predniSONE  40 mg Oral Q breakfast  . senna-docusate  1 tablet Oral QHS  . sodium chloride  10-20 mL Intravenous Q12H  . sodium chloride  3 mL Intravenous Q12H  . tobramycin-dexamethasone   Right Eye TID   Elmarie Shiley, MD 09/15/2013, 9:22 AM

## 2013-09-15 NOTE — Progress Notes (Addendum)
      [address]       ,[place] [address]             [phone]      5 Days Post-Op Procedure(s) (LRB): MEDIASTINOTOMY CHAMBERLAIN MCNEIL (Right)  Subjective:  [name] complains of gout pain in his right knee this morning.  He states that he is not able to walk on his leg due to this.  He also states that he is unable to swallow and is requesting medications be given IV.  Objective: Vital signs in last 24 hours: Temp:  [98.5 F (36.9 C)-99.8 F (37.7 C)] 99.1 F (37.3 C) (07/12 0559) Pulse Rate:  [80-87] 83 (07/12 0559) Cardiac Rhythm:  [-] A-V Sequential paced (07/12 0815) Resp:  [18-20] 20 (07/12 0559) BP: (121-131)/(71-75) 131/71 mmHg (07/12 0559) SpO2:  [99 %-100 %] 99 % (07/12 0559) Weight:  [260 lb 9.3 oz (118.2 kg)] 260 lb 9.3 oz (118.2 kg) (07/12 0559)  Intake/Output from previous day: 07/11 0701 - 07/12 0700 In: 582.5 [P.O.:360; I.V.:222.5] Out: 1251 [Urine:1250; Stool:1]  General appearance: alert, cooperative and no distress Heart: regular rate and rhythm Lungs: clear to auscultation bilaterally Abdomen: soft, non-tender; bowel sounds normal; no masses,  no organomegaly Extremities: no edema, right knee does not appear swollen, no erythema present Wound: clean and dry  Lab Results:  Recent Labs  09/14/13 0428 09/15/13 0500  WBC 14.6* 12.3*  HGB 12.6* 11.8*  HCT 36.9* 34.9*  PLT 174 176   BMET:  Recent Labs  09/14/13 0428 09/15/13 0500  NA 135* 135*  K 3.8 3.6*  CL 95* 95*  CO2 25 27  GLUCOSE 103* 92  BUN 40* 36*  CREATININE 2.04* 1.79*  CALCIUM 8.4 8.2*    PT/INR: No results found for this basename: LABPROT, INR,  in the last 72 hours ABG    Component Value Date/Time   PHART 7.364 09/11/2013 0433   HCO3 23.3 09/11/2013 0433   TCO2 25 09/11/2013 0433   ACIDBASEDEF 2.0 09/11/2013 0433   O2SAT 60.4 09/15/2013 0453   CBG (last 3)  No results found for this basename: GLUCAP,  in the last 72  hours  Assessment/Plan: S/P Procedure(s) (LRB): MEDIASTINOTOMY CHAMBERLAIN MCNEIL (Right)  1. CV- Ischemic Cardiomyopathy, advance HF team following- remains on Milrinone, Coreg 2. Renal- CKD stage IV, creatinine down to 1.79, will repeat BMET in AM, can hopefully get CT scan soon 3. Dysphagia- patient states unable to swallow, wants medications IV, soft diet 4. ?Gout- per patient flare in right knee, mildly febrile this morning, Uric Acid minimally elevated, steroids, colchicine, uloric ordered 5. Dispo- creatinine improving, patient with dysphagia this morning will adjust diet/medications, hopefully can get CT scan soon if creatinine lowers further   LOS: 5 days    BARRETT, ERIN 09/15/2013  Throat discomfort probably related to endotracheal tube and out the mediastinal mass We'll start Chloraseptic spray but check barium swallow as well Surgical incision healing well Doing well on milrinone

## 2013-09-15 NOTE — Progress Notes (Signed)
Pt is able to place on home mask when needed. Pt encouraged to call RT if needing any assistance.

## 2013-09-16 ENCOUNTER — Other Ambulatory Visit: Payer: Self-pay | Admitting: *Deleted

## 2013-09-16 ENCOUNTER — Inpatient Hospital Stay (HOSPITAL_COMMUNITY): Payer: 59

## 2013-09-16 LAB — COMPREHENSIVE METABOLIC PANEL
ALT: 20 U/L (ref 0–53)
AST: 28 U/L (ref 0–37)
Albumin: 2.6 g/dL — ABNORMAL LOW (ref 3.5–5.2)
Alkaline Phosphatase: 79 U/L (ref 39–117)
Anion gap: 13 (ref 5–15)
BUN: 34 mg/dL — ABNORMAL HIGH (ref 6–23)
CO2: 26 mEq/L (ref 19–32)
Calcium: 9 mg/dL (ref 8.4–10.5)
Chloride: 96 mEq/L (ref 96–112)
Creatinine, Ser: 1.47 mg/dL — ABNORMAL HIGH (ref 0.50–1.35)
GFR calc Af Amer: 61 mL/min — ABNORMAL LOW (ref >90)
GFR calc non Af Amer: 52 mL/min — ABNORMAL LOW (ref >90)
Glucose, Bld: 134 mg/dL — ABNORMAL HIGH (ref 70–99)
Potassium: 4.1 mEq/L (ref 3.7–5.3)
Sodium: 135 mEq/L — ABNORMAL LOW (ref 137–147)
Total Bilirubin: 0.4 mg/dL (ref 0.3–1.2)
Total Protein: 6.7 g/dL (ref 6.0–8.3)

## 2013-09-16 LAB — MAGNESIUM: MAGNESIUM: 1.7 mg/dL (ref 1.5–2.5)

## 2013-09-16 LAB — CARBOXYHEMOGLOBIN
Carboxyhemoglobin: 1.3 % (ref 0.5–1.5)
Methemoglobin: 0.7 % (ref 0.0–1.5)
O2 Saturation: 71.3 %
Total hemoglobin: 12.3 g/dL — ABNORMAL LOW (ref 13.5–18.0)

## 2013-09-16 LAB — CBC
HCT: 35.9 % — ABNORMAL LOW (ref 39.0–52.0)
Hemoglobin: 12.3 g/dL — ABNORMAL LOW (ref 13.0–17.0)
MCH: 28.6 pg (ref 26.0–34.0)
MCHC: 34.3 g/dL (ref 30.0–36.0)
MCV: 83.5 fL (ref 78.0–100.0)
Platelets: 221 10*3/uL (ref 150–400)
RBC: 4.3 MIL/uL (ref 4.22–5.81)
RDW: 13.7 % (ref 11.5–15.5)
WBC: 16.5 10*3/uL — ABNORMAL HIGH (ref 4.0–10.5)

## 2013-09-16 NOTE — H&P (Signed)
S: No complaints today. Having some knee pains, from his gout flare.   O:BP 138/75  Pulse 79  Temp(Src) 97.8 F (36.6 C) (Oral)  Resp 18  Ht 5\' 10"  (1.778 m)  Wt 258 lb 12.8 oz (117.391 kg)  BMI 37.13 kg/m2  SpO2 96%  Intake/Output Summary (Last 24 hours) at 09/16/13 0240 Last data filed at 09/16/13 0700  Gross per 24 hour  Intake    120 ml  Output    825 ml  Net   -705 ml   Intake/Output: I/O last 3 completed shifts: In: 240 [P.O.:240] Out: 1750 [Urine:1750]  Intake/Output this shift:    Weight change: -1 lb 12.5 oz (-0.809 kg)  Physical Exam Gen: NAD Heent: AT, [place], EOMI, neck supple CVS: RRR Resp: clear to auscultation. Abd: Soft, non tender Ext: +1 pedal edema Neuro: Alert and oriented, moves all extremities.  Recent Labs Lab 09/09/13 1504  09/11/13 0433 09/11/13 1532 09/12/13 0400 09/13/13 0358 09/14/13 0428 09/15/13 0500 09/16/13 0500  NA 136*  < > 135* 136* 137 135* 135* 135* 135*  K 5.2  < > 6.0* 4.4 4.1 4.1 3.8 3.6* 4.1  CL 101  < > 100 98 98 97 95* 95* 96  CO2 16*  < > 20 24 25 25 25 27 26   GLUCOSE 93  < > 137* 134* 137* 122* 103* 92 134*  BUN 25*  < > 35* 36* 37* 38* 40* 36* 34*  CREATININE 1.59*  < > 2.48* 2.66* 2.33* 2.07* 2.04* 1.79* 1.47*  ALBUMIN 3.7  --   --   --  2.9* 2.9* 2.7* 2.5* 2.6*  CALCIUM 9.4  < > 8.3* 8.2* 8.2* 8.2* 8.4 8.2* 9.0  AST 29  --   --   --  27 28 29 28 28   ALT 28  --   --   --  18 18 18 18 20   < > = values in this interval not displayed. Liver Function Tests:  Recent Labs Lab 09/14/13 0428 09/15/13 0500 09/16/13 0500  AST 29 28 28   ALT 18 18 20   ALKPHOS 84 73 79  BILITOT 0.7 0.5 0.4  PROT 6.8 6.3 6.7  ALBUMIN 2.7* 2.5* 2.6*   CBC:  Recent Labs Lab 09/12/13 0400 09/13/13 0358 09/14/13 0428 09/15/13 0500 09/16/13 0500  WBC 18.5* 17.2* 14.6* 12.3* 16.5*  HGB 13.9 13.3 12.6* 11.8* 12.3*  HCT 40.7 39.8 36.9* 34.9* 35.9*  MCV 86.0 86.0 85.2 84.3 83.5  PLT 164 166 174 176 221   Studies/Results: No  results found. Marland Kitchen acetaminophen  1,000 mg Oral 4 times per day   Or  . acetaminophen (TYLENOL) oral liquid 160 mg/5 mL  1,000 mg Oral 4 times per day  . antiseptic oral rinse  15 mL Mouth Rinse q12n4p  . atorvastatin  40 mg Oral QHS  . bisacodyl  10 mg Oral Daily  . carvedilol  25 mg Oral BID WC  . cefTAZidime (FORTAZ)  IV  1 g Intravenous Q12H  . chlorhexidine  15 mL Mouth Rinse BID  . digoxin  0.125 mg Oral Daily  . enoxaparin (LOVENOX) injection  30 mg Subcutaneous Q24H  . febuxostat  40 mg Oral Daily  . furosemide  40 mg Oral Daily  . gabapentin  200 mg Oral BID  . guaiFENesin  600 mg Oral BID  . levothyroxine  150 mcg Oral QAC breakfast  . magnesium oxide  400 mg Oral Daily  . methylPREDNISolone (SOLU-MEDROL) injection  40  mg Intravenous Daily  . senna-docusate  1 tablet Oral QHS  . sodium chloride  10-20 mL Intravenous Q12H  . sodium chloride  3 mL Intravenous Q12H  . tobramycin-dexamethasone   Right Eye TID    BMET    Component Value Date/Time   NA 135* 09/16/2013 0500   K 4.1 09/16/2013 0500   CL 96 09/16/2013 0500   CO2 26 09/16/2013 0500   GLUCOSE 134* 09/16/2013 0500   BUN 34* 09/16/2013 0500   CREATININE 1.47* 09/16/2013 0500   CREATININE 1.76* 06/28/2012 1228   CALCIUM 9.0 09/16/2013 0500   GFRNONAA 52* 09/16/2013 0500   GFRAA 61* 09/16/2013 0500   CBC    Component Value Date/Time   WBC 16.5* 09/16/2013 0500   WBC 31.4* 06/28/2012 1259   RBC 4.30 09/16/2013 0500   RBC 5.74 06/28/2012 1259   HGB 12.3* 09/16/2013 0500   HGB 16.8 06/28/2012 1259   HCT 35.9* 09/16/2013 0500   HCT 51.4 06/28/2012 1259   PLT 221 09/16/2013 0500   MCV 83.5 09/16/2013 0500   MCV 89.6 06/28/2012 1259   MCH 28.6 09/16/2013 0500   MCH 29.3 06/28/2012 1259   MCHC 34.3 09/16/2013 0500   MCHC 32.7 06/28/2012 1259   RDW 13.7 09/16/2013 0500   LYMPHSABS 2.1 06/29/2012 0540   MONOABS 2.7* 06/29/2012 0540   EOSABS 0.0 06/29/2012 0540   BASOSABS 0.0 06/29/2012 0540     Assessment/Plan:  1. Acute on  chronic kidney injury- Resolving acute component. Cr today- 1.47, appears to be about pts baseline.  Mild fluid overload, on lasix 40mg  daily. Urine out put yesterday- 1021mls, net neg- 785. Fluid overload, likely from CHF and CKD. Weight up, 251- 09/04/2013, and is 258 today. . Planned for possible contrast Ct, on milrinone to increase renal perfusion, as pt might not tolerate prehydration due to low EF. Consider stopping lasix for now.  2. Leukocytosis- trending down- 16.5 today, without fever. Increased likely from starting prednisone for gout flare.  3. Hx of HTN,Bp initially soft, now stable, on milrinone.   4. 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   5. CHF- EF- 15%- Management per cards.   6. Hypomagnesemia- 1.2 today. On daily suplimentation of Mag at home- continued on admission. Supplimented with IV Mag 4g yesterday.   7. Gout Flare-  Home meds febuxostat and colchicine. Febuxostat while on admission. IV prednisone 40mg  daily started yesterday for acute flare.   Emokpae, Ejiroghene IMTS, PGY- 2  I saw the patient and agree with the above assessment and plan.   Off diuretics and plan for CT contrast.  WOuld hold diuretics for 24h post contrast.  Stable GFR.    Pearson Grippe, MD

## 2013-09-16 NOTE — Progress Notes (Signed)
Pt will place on home machine when ready. Pt encouraged to call RT if needing any assistance.

## 2013-09-16 NOTE — Progress Notes (Signed)
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   He is intoelrant to numerous HF meds including spiro (severe hyperkalemia - requiring HD), hydralazine (fuzzy-headed) and imdur (severe HAs).   Recently found to have enlarging mediastinal mass now with compression of trachea and esophagus. Last year a transbronchial EBUS biopsy performed by pulmonary was nondiagnostic. He then had a mediastinoscopy which returned thyroid tissue. A thyroid scan subtotally showed some activity in the mediastinum and he underwent thyroid suppressive therapy by Dr. Buddy Duty without success and in fact the mass is enlarging. Because of concern this could relieve be a lymphoma PET scan was performed showing hypermetabolic activity as well as hypermetabolic activity of a left cervical lymph node. The lymph node was biopsied by IR and this showed lymphoid tissue but scant sample size, not enough to perform flow cytometry.   Admitted and underwent R mediastinoscopy- bx mediastinal mass- c/w vascular malformation.   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 Recommended further imaging with contrast CT. Started on milrinone in an attempt to improve renal function.   Cr improving 2.07->2.04->1.79>1.4.  Diuretics restarted. Ongoing RLE pain. Day 2 of prednisone. Says throat discomfort a little better. Had barium swallow today with no acute findings.   Denies SOB   Objective:   Weight Range:  Vital Signs:   Temp:  [97.8 F (36.6 C)-98.1 F (36.7 C)] 97.8 F (36.6 C) (07/13 0536) Pulse Rate:  [72-84] 79 (07/13  0749) Resp:  [18-19] 18 (07/13 0536) BP: (130-145)/(65-80) 138/75 mmHg (07/13 0749) SpO2:  [96 %] 96 % (07/13 0536) Weight:  [258 lb 12.8 oz (117.391 kg)] 258 lb 12.8 oz (117.391 kg) (07/13 0536) Last BM Date: 09/16/13  Weight change: Filed Weights   09/14/13 0542 09/15/13 0559 09/16/13 0536  Weight: 261 lb 11.2 oz (118.706 kg) 260 lb 9.3 oz (118.2 kg) 258 lb 12.8 oz (117.391 kg)    Intake/Output:   Intake/Output Summary (Last 24 hours) at 09/16/13 0932 Last data filed at 09/16/13 0700  Gross per 24 hour  Intake    120 ml  Output    825 ml  Net   -705 ml     Physical Exam: General:  Sitting in chair No resp difficulty HEENT: normal Neck: supple. JVP 8; RIJ 2L central line; Carotids 2+ bilat; no bruits. No lymphadenopathy or thryomegaly appreciated. Cor: PMI nondisplaced. Regular rate & rhythm. No rubs, gallops or murmurs. Lungs: clear.  Abdomen: soft, nontender, non distended. Good bowel sounds. Extremities: no cyanosis, clubbing, rash, tr edema R knee warm and tender to touch Neuro: alert & orientedx3, cranial nerves grossly intact. moves all 4 extremities w/o difficulty. Affect pleasant Skin: R upper chest incision approximated.   Telemetry: SR  Labs: Basic Metabolic Panel:  Recent Labs Lab 09/12/13 0400 09/12/13 1016 09/13/13 0358 09/14/13 0428 09/15/13 0500 09/16/13 0500  NA 137  --  135* 135* 135* 135*  K 4.1  --  4.1 3.8 3.6* 4.1  CL 98  --  97 95* 95* 96  CO2 25  --  25 25 27 26   GLUCOSE 137*  --  122* 103* 92  134*  BUN 37*  --  38* 40* 36* 34*  CREATININE 2.33*  --  2.07* 2.04* 1.79* 1.47*  CALCIUM 8.2*  --  8.2* 8.4 8.2* 9.0  MG  --  1.2*  --  2.0  --   --     Liver Function Tests:  Recent Labs Lab 09/12/13 0400 09/13/13 0358 09/14/13 0428 09/15/13 0500 09/16/13 0500  AST 27 28 29 28 28   ALT 18 18 18 18 20   ALKPHOS 66 72 84 73 79  BILITOT 1.7* 1.1 0.7 0.5 0.4  PROT 6.8 6.9 6.8 6.3 6.7  ALBUMIN 2.9* 2.9* 2.7* 2.5* 2.6*   No results  found for this basename: LIPASE, AMYLASE,  in the last 168 hours No results found for this basename: AMMONIA,  in the last 168 hours  CBC:  Recent Labs Lab 09/12/13 0400 09/13/13 0358 09/14/13 0428 09/15/13 0500 09/16/13 0500  WBC 18.5* 17.2* 14.6* 12.3* 16.5*  HGB 13.9 13.3 12.6* 11.8* 12.3*  HCT 40.7 39.8 36.9* 34.9* 35.9*  MCV 86.0 86.0 85.2 84.3 83.5  PLT 164 166 174 176 221    Cardiac Enzymes: No results found for this basename: CKTOTAL, CKMB, CKMBINDEX, TROPONINI,  in the last 168 hours  BNP: BNP (last 3 results) No results found for this basename: PROBNP,  in the last 8760 hours   Imaging: No results found.   Medications:     Scheduled Medications: . acetaminophen  1,000 mg Oral 4 times per day   Or  . acetaminophen (TYLENOL) oral liquid 160 mg/5 mL  1,000 mg Oral 4 times per day  . antiseptic oral rinse  15 mL Mouth Rinse q12n4p  . atorvastatin  40 mg Oral QHS  . bisacodyl  10 mg Oral Daily  . carvedilol  25 mg Oral BID WC  . cefTAZidime (FORTAZ)  IV  1 g Intravenous Q12H  . chlorhexidine  15 mL Mouth Rinse BID  . digoxin  0.125 mg Oral Daily  . enoxaparin (LOVENOX) injection  30 mg Subcutaneous Q24H  . febuxostat  40 mg Oral Daily  . furosemide  40 mg Oral Daily  . gabapentin  200 mg Oral BID  . guaiFENesin  600 mg Oral BID  . levothyroxine  150 mcg Oral QAC breakfast  . magnesium oxide  400 mg Oral Daily  . methylPREDNISolone (SOLU-MEDROL) injection  40 mg Intravenous Daily  . senna-docusate  1 tablet Oral QHS  . sodium chloride  10-20 mL Intravenous Q12H  . sodium chloride  3 mL Intravenous Q12H  . tobramycin-dexamethasone   Right Eye TID    Infusions: . milrinone 0.25 mcg/kg/min (09/16/13 0109)    PRN Medications: acetaminophen, colchicine, oxyCODONE, phenol, sodium chloride, sodium chloride, sodium chloride   Assessment:   1) Chronic systolic HF - EF 62% (04/2977) 2) NICM - s/p Medtronic ICD 3) S/P R anterior mediastinotomy.  Mediastinal mass c/w vascular malformation 4) Acute on chronic renal failure, stage IV 5) Bladder Cancer 6) Hypomagnesemia - 09/12/13 Magnesium 1.2 replaced  7) Acute gout.   Plan/Discussion:   POD #5 R anterior mediastinotomy   On Milrinone in an attempt to maximize cardiac output and renal function to permit further contrasted imaging of mediastinal mass (hibernoma vs obstructed azygous). CO-OX 71 %.  Renal function improving. Creatinine 1.4  Volume status ok. Hold diuretics for CT. Dr Prescott Gum to order CT with contrast and IV in both arms with contrast injection bilaterally to assess venous flow to chest. (Discussed with Dr. Ulice Dash  [place] in [employer] [employer]).   Continue carvedilol 25 mg bid and dig 0.125 mg daily. Dig level 0.4. Not on hydralazine due to dizziness or Imdur due to headaches. Also intolerant spironolactone due to hyperkalemia.   Magnesium 09/14/13 1.2  Supplemented. Check magnesium today to make sure it has trended up.    Day #2 Solumedrol for gout. Continue colchicine and uloric. Feeling better, can likely convert to prednisone tomorrow.   Barium Swallow today.  No anatomic problems noted   Check TSH tomorrow.   CLEGG,AMY,NP-C  9:32 AM  Patient seen with NP, agree with the above note.  Creatinine down to 1.4 with milrinone.  Hold diuretics for now and proceed with contrast CT today.  Will continue milrinone until tomorrow, then start weaning.  Convert to po steroids tomorrow.  Will recheck magnesium.   Loralie Champagne 09/16/2013 11:00 AM

## 2013-09-16 NOTE — Progress Notes (Signed)
[address]       [place] [address]             [phone]      6 Days Post-Op Procedure(s) (LRB): MEDIASTINOTOMY CHAMBERLAIN MCNEIL (Right) Subjective: Gout flare has improved some  Objective: Vital signs in last 24 hours: Temp:  [97.8 F (36.6 C)-98.1 F (36.7 C)] 97.8 F (36.6 C) (07/13 0536) Pulse Rate:  [72-84] 72 (07/13 0536) Cardiac Rhythm:  [-] A-V Sequential paced (07/12 2136) Resp:  [18-19] 18 (07/13 0536) BP: (130-145)/(65-80) 145/65 mmHg (07/13 0536) SpO2:  [96 %] 96 % (07/13 0536) Weight:  [258 lb 12.8 oz (117.391 kg)] 258 lb 12.8 oz (117.391 kg) (07/13 0536)  Hemodynamic parameters for last 24 hours:    Intake/Output from previous day: 07/12 0701 - 07/13 0700 In: 240 [P.O.:240] Out: 925 [Urine:925] Intake/Output this shift:    General appearance: alert, cooperative and no distress Heart: regular rate and rhythm Lungs: clear to auscultation bilaterally Abdomen: benign Extremities: no edema Wound: incis healing well  Lab Results:  Recent Labs  09/15/13 0500 09/16/13 0500  WBC 12.3* 16.5*  HGB 11.8* 12.3*  HCT 34.9* 35.9*  PLT 176 221   BMET:  Recent Labs  09/15/13 0500 09/16/13 0500  NA 135* 135*  K 3.6* 4.1  CL 95* 96  CO2 27 26  GLUCOSE 92 134*  BUN 36* 34*  CREATININE 1.79* 1.47*  CALCIUM 8.2* 9.0    PT/INR: No results found for this basename: LABPROT, INR,  in the last 72 hours ABG    Component Value Date/Time   PHART 7.364 09/11/2013 0433   HCO3 23.3 09/11/2013 0433   TCO2 25 09/11/2013 0433   ACIDBASEDEF 2.0 09/11/2013 0433   O2SAT 71.3 09/16/2013 0525   CBG (last 3)  No results found for this basename: GLUCAP,  in the last 72 hours Scheduled Meds: . acetaminophen  1,000 mg Oral 4 times per day   Or  . acetaminophen (TYLENOL) oral liquid 160 mg/5 mL  1,000 mg Oral 4 times per day  . antiseptic oral rinse  15 mL Mouth Rinse q12n4p  . atorvastatin  40 mg Oral QHS  . bisacodyl  10 mg Oral Daily    . carvedilol  25 mg Oral BID WC  . cefTAZidime (FORTAZ)  IV  1 g Intravenous Q12H  . chlorhexidine  15 mL Mouth Rinse BID  . digoxin  0.125 mg Oral Daily  . enoxaparin (LOVENOX) injection  30 mg Subcutaneous Q24H  . febuxostat  40 mg Oral Daily  . furosemide  40 mg Oral Daily  . gabapentin  200 mg Oral BID  . guaiFENesin  600 mg Oral BID  . levothyroxine  150 mcg Oral QAC breakfast  . magnesium oxide  400 mg Oral Daily  . methylPREDNISolone (SOLU-MEDROL) injection  40 mg Intravenous Daily  . senna-docusate  1 tablet Oral QHS  . sodium chloride  10-20 mL Intravenous Q12H  . sodium chloride  3 mL Intravenous Q12H  . tobramycin-dexamethasone   Right Eye TID   Continuous Infusions: . milrinone 0.25 mcg/kg/min (09/16/13 0109)   PRN Meds:.acetaminophen, colchicine, oxyCODONE, phenol, sodium chloride, sodium chloride, sodium chloride  No results found. Assessment/Plan: S/P Procedure(s) (LRB): MEDIASTINOTOMY CHAMBERLAIN MCNEIL (Right)  1 afebrile and hemodyn stable on milrinone/lasix. Paced rhythm. HF team is following 2 Creat has improved to 1.47 3 increased leukocytosis- on predisone . H/H stable 4 Gout flare- on prednisone 5 poss contrast CT  today  LOS: 6 days    GOLD,Simuel E 09/16/2013

## 2013-09-17 ENCOUNTER — Inpatient Hospital Stay (HOSPITAL_COMMUNITY): Payer: 59

## 2013-09-17 LAB — MAGNESIUM: Magnesium: 1.8 mg/dL (ref 1.5–2.5)

## 2013-09-17 LAB — COMPREHENSIVE METABOLIC PANEL
ALT: 30 U/L (ref 0–53)
AST: 40 U/L — ABNORMAL HIGH (ref 0–37)
Albumin: 2.7 g/dL — ABNORMAL LOW (ref 3.5–5.2)
Alkaline Phosphatase: 83 U/L (ref 39–117)
Anion gap: 15 (ref 5–15)
BUN: 38 mg/dL — ABNORMAL HIGH (ref 6–23)
CO2: 24 mEq/L (ref 19–32)
Calcium: 8.9 mg/dL (ref 8.4–10.5)
Chloride: 96 mEq/L (ref 96–112)
Creatinine, Ser: 1.51 mg/dL — ABNORMAL HIGH (ref 0.50–1.35)
GFR calc Af Amer: 59 mL/min — ABNORMAL LOW (ref >90)
GFR calc non Af Amer: 51 mL/min — ABNORMAL LOW (ref >90)
Glucose, Bld: 121 mg/dL — ABNORMAL HIGH (ref 70–99)
Potassium: 4.3 mEq/L (ref 3.7–5.3)
Sodium: 135 mEq/L — ABNORMAL LOW (ref 137–147)
Total Bilirubin: 0.3 mg/dL (ref 0.3–1.2)
Total Protein: 6.9 g/dL (ref 6.0–8.3)

## 2013-09-17 LAB — CBC
HCT: 36.5 % — ABNORMAL LOW (ref 39.0–52.0)
Hemoglobin: 13.1 g/dL (ref 13.0–17.0)
MCH: 30 pg (ref 26.0–34.0)
MCHC: 35.9 g/dL (ref 30.0–36.0)
MCV: 83.5 fL (ref 78.0–100.0)
Platelets: 273 10*3/uL (ref 150–400)
RBC: 4.37 MIL/uL (ref 4.22–5.81)
RDW: 13.8 % (ref 11.5–15.5)
WBC: 21.7 10*3/uL — ABNORMAL HIGH (ref 4.0–10.5)

## 2013-09-17 LAB — CARBOXYHEMOGLOBIN
Carboxyhemoglobin: 1.2 % (ref 0.5–1.5)
Methemoglobin: 0.7 % (ref 0.0–1.5)
O2 Saturation: 73.4 %
Total hemoglobin: 12.8 g/dL — ABNORMAL LOW (ref 13.5–18.0)

## 2013-09-17 LAB — TSH: TSH: 0.913 u[IU]/mL (ref 0.350–4.500)

## 2013-09-17 MED ORDER — PREDNISONE 20 MG PO TABS
20.0000 mg | ORAL_TABLET | Freq: Every day | ORAL | Status: DC
  Administered 2013-09-17 – 2013-09-18 (×2): 20 mg via ORAL
  Filled 2013-09-17 (×3): qty 1

## 2013-09-17 MED ORDER — IOHEXOL 300 MG/ML  SOLN
100.0000 mL | Freq: Once | INTRAMUSCULAR | Status: AC | PRN
  Administered 2013-09-17: 100 mL via INTRAVENOUS

## 2013-09-17 NOTE — Progress Notes (Signed)
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   He is intoelrant to numerous HF meds including spiro (severe hyperkalemia - requiring HD), hydralazine (fuzzy-headed) and imdur (severe HAs).   Recently found to have enlarging mediastinal mass now with compression of trachea and esophagus. Last year a transbronchial EBUS biopsy performed by pulmonary was nondiagnostic. He then had a mediastinoscopy which returned thyroid tissue. A thyroid scan subtotally showed some activity in the mediastinum and he underwent thyroid suppressive therapy by Dr. Buddy Duty without success and in fact the mass is enlarging. Because of concern this could relieve be a lymphoma PET scan was performed showing hypermetabolic activity as well as hypermetabolic activity of a left cervical lymph node. The lymph node was biopsied by IR and this showed lymphoid tissue but scant sample size, not enough to perform flow cytometry.   Admitted and underwent R mediastinoscopy- bx mediastinal mass- c/w vascular malformation.   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 Recommended further imaging with contrast CT. Started on milrinone in an attempt to improve renal function.   Cr improving 2.07->2.04->1.79>1.4>1.5 .  Diuretics held for CT.  Ongoing RLE pain. Day 3 of solumedrol. Throat discomfort a little better. Had barium swallow today with no acute findings.   Denies SOB   Objective:   Weight Range:  Vital Signs:   Temp:  [97.6 F (36.4 C)-98.2 F (36.8 C)] 97.6 F (36.4 C) (07/14 0452) Pulse Rate:  [74-83] 82 (07/14  0705) Resp:  [18] 18 (07/14 0452) BP: (131-147)/(65-96) 147/96 mmHg (07/14 0452) SpO2:  [92 %-95 %] 92 % (07/14 0452) Weight:  [259 lb 8 oz (117.708 kg)] 259 lb 8 oz (117.708 kg) (07/14 0452) Last BM Date: 09/15/13  Weight change: Filed Weights   09/15/13 0559 09/16/13 0536 09/17/13 0452  Weight: 260 lb 9.3 oz (118.2 kg) 258 lb 12.8 oz (117.391 kg) 259 lb 8 oz (117.708 kg)    Intake/Output:   Intake/Output Summary (Last 24 hours) at 09/17/13 0837 Last data filed at 09/17/13 0730  Gross per 24 hour  Intake    720 ml  Output    626 ml  Net     94 ml     Physical Exam: General:  Sitting in chair No resp difficulty HEENT: normal Neck: supple. JVP 6-7 RIJ 2L central line; Carotids 2+ bilat; no bruits. No lymphadenopathy or thryomegaly appreciated. Cor: PMI nondisplaced. Regular rate & rhythm. No rubs, gallops or murmurs. Lungs: clear.  Abdomen: soft, nontender, non distended. Good bowel sounds. Extremities: no cyanosis, clubbing, rash, tr edema R knee warm and tender to touch Neuro: alert & orientedx3, cranial nerves grossly intact. moves all 4 extremities w/o difficulty. Affect pleasant Skin: R upper chest incision approximated.   Telemetry: SR  Labs: Basic Metabolic Panel:  Recent Labs Lab 09/12/13 1016 09/13/13 0358 09/14/13 0428 09/15/13 0500 09/16/13 0500 09/17/13 0549  NA  --  135* 135* 135* 135* 135*  K  --  4.1 3.8 3.6* 4.1 4.3  CL  --  97 95* 95* 96 96  CO2  --  25 25 27 26 24   GLUCOSE  --  122* 103* 92 134* 121*  BUN  --  38* 40* 36* 34* 38*  CREATININE  --  2.07* 2.04* 1.79* 1.47* 1.51*  CALCIUM  --  8.2* 8.4 8.2* 9.0 8.9  MG 1.2*  --  2.0  --  1.7 1.8    Liver Function Tests:  Recent Labs Lab 09/13/13 0358 09/14/13 0428 09/15/13 0500 09/16/13 0500 09/17/13 0549  AST 28 29 28 28  40*  ALT 18 18 18 20 30   ALKPHOS 72 84 73 79 83  BILITOT 1.1 0.7 0.5 0.4 0.3  PROT 6.9 6.8 6.3 6.7 6.9  ALBUMIN 2.9* 2.7* 2.5* 2.6* 2.7*   No results found for  this basename: LIPASE, AMYLASE,  in the last 168 hours No results found for this basename: AMMONIA,  in the last 168 hours  CBC:  Recent Labs Lab 09/13/13 0358 09/14/13 0428 09/15/13 0500 09/16/13 0500 09/17/13 0549  WBC 17.2* 14.6* 12.3* 16.5* 21.7*  HGB 13.3 12.6* 11.8* 12.3* 13.1  HCT 39.8 36.9* 34.9* 35.9* 36.5*  MCV 86.0 85.2 84.3 83.5 83.5  PLT 166 174 176 221 273    Cardiac Enzymes: No results found for this basename: CKTOTAL, CKMB, CKMBINDEX, TROPONINI,  in the last 168 hours  BNP: BNP (last 3 results) No results found for this basename: PROBNP,  in the last 8760 hours   Imaging: Dg Esophagus  09/16/2013   CLINICAL DATA:  Dysphagia after recent resection of chest mass.  EXAM: ESOPHOGRAM/BARIUM SWALLOW  TECHNIQUE: Single contrast examination was performed using  thin barium.  FLUOROSCOPY TIME:  2 min and 11 seconds  COMPARISON:  Chest CT of 08/27/2013  FINDINGS: Focused exam in LPO position was performed, secondary to patient clinical status and mild immobility. Evaluation of individual swallows demonstrates normal esophageal motility.  Full column evaluation of the esophagus demonstrates no focal narrowing to suggest stricture. No gross mass effect or esophageal displacement.  Hypopharyngeal portion of the exam is within normal limits. Series 40.  IMPRESSION: 1. Focused, mildly degraded exam as detailed above. 2. No anatomic explanation for patient's symptoms.   Electronically Signed   By: Abigail Miyamoto M.D.   On: 09/16/2013 09:34     Medications:     Scheduled Medications: . acetaminophen  1,000 mg Oral 4 times per day   Or  . acetaminophen (TYLENOL) oral liquid 160 mg/5 mL  1,000 mg Oral 4 times per day  . antiseptic oral rinse  15 mL Mouth Rinse q12n4p  . atorvastatin  40 mg Oral QHS  . bisacodyl  10 mg Oral Daily  . carvedilol  25 mg Oral BID WC  . cefTAZidime (FORTAZ)  IV  1 g Intravenous Q12H  . chlorhexidine  15 mL Mouth Rinse BID  . digoxin  0.125 mg Oral  Daily  . enoxaparin (LOVENOX) injection  30 mg Subcutaneous Q24H  . febuxostat  40 mg Oral Daily  . gabapentin  200 mg Oral BID  . guaiFENesin  600 mg Oral BID  . levothyroxine  150 mcg Oral QAC breakfast  . magnesium oxide  400 mg Oral Daily  . methylPREDNISolone (SOLU-MEDROL) injection  40 mg Intravenous Daily  . senna-docusate  1 tablet Oral QHS  . sodium chloride  10-20 mL Intravenous Q12H  . sodium chloride  3 mL Intravenous Q12H  . tobramycin-dexamethasone   Right Eye TID    Infusions: . milrinone 0.25 mcg/kg/min (09/17/13 0808)    PRN Medications: acetaminophen, colchicine, oxyCODONE, phenol, sodium chloride, sodium chloride, sodium chloride  Assessment:   1) Chronic systolic HF - EF 67% (03/2456) 2) NICM - s/p Medtronic ICD 3) S/P R anterior mediastinotomy. Mediastinal mass c/w vascular malformation 4) Acute on chronic renal failure, stage IV 5) Bladder Cancer 6) Hypomagnesemia - 09/12/13 Magnesium 1.2 replaced  7) Acute gout.   Plan/Discussion:   POD #6 R anterior mediastinotomy   On Milrinone in an attempt to maximize cardiac output and renal function to permit further contrasted imaging of mediastinal mass (hibernoma vs obstructed azygous). CO-OX 73 %.  Renal function ok. Creatinine 1.5  . Restart diuretics hopefully tomorrow.   CT today with Dr Pascal Lux in IR.  CT with contrast and IV in both arms with contrast injection bilaterally to assess venous flow to chest. (Discussed with Dr. Ronny Bacon in IR).   Continue carvedilol 25 mg bid and dig 0.125 mg daily. Dig level 0.4. Not on hydralazine due to dizziness or Imdur due to headaches. Also intolerant spironolactone due to hyperkalemia.   Magnesium 09/14/13 1.2  Supplemented and rechecked levels have been ok.   Day #3 Solumedrol for gout. Continue colchicine and uloric.   Barium Swallow today.  No anatomic problems noted    WBC 21 ? Afebrile on steriods. On Fortaz day # 6   [name],AMY,NP-C  8:37 AM  Patient  seen with NP, agree with the above note.  Creatinine stable on milrinone, plan for CT today with IR, possible vascular malformation.  Will keep on milrinone overnight after CT with contrast, then stop tomorrow.  Can go back on home diuretics likely tomorrow and suspect he can go home at that time unless surgery thinks he needs any further tests/procedures.  I will stop IV solumedrol today, start prednisone taper.   Loralie Champagne 09/17/2013 8:59 AM

## 2013-09-17 NOTE — Discharge Summary (Signed)
Physician Discharge Summary  Patient ID: [name] MRN: [id number] DOB/AGE: 1960/08/31 53 y.o.  Admit date: 09/10/2013 Discharge date: 09/17/2013  Admission Diagnoses:  Patient Active Problem List   Diagnosis Date Noted  . Chest discomfort 09/10/2013  . Bladder cancer 11/09/2012  . Mediastinal mass 09/28/2012  . Preoperative cardiovascular examination 09/11/2012  . Hilar mass   . Malignant tumor of urinary bladder 07/13/2012  . Chronic combined systolic and diastolic CHF (congestive heart failure) 06/28/2012  . Gout 05/17/2012  . Inappropriate shocks from ICD -secondary T wave oversensing in the context of hyperkalemia 04/12/2012  . Biventricular ICD-Medtronic 05/17/2011  . 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 ORTHOSTATIC DIZZINESS 11/03/2009  . VENTRICULAR TACHYCARDIA 10/20/2008  . SYSTOLIC HEART FAILURE, CHRONIC 10/20/2008  . DYSPNEA 03/25/2008  . ABNORMAL LUNG XRAY 03/25/2008  . OBSTRUCTIVE SLEEP APNEA 03/12/2007  . Essential hypertension, benign 03/12/2007  . CARDIOMYOPATHY 03/12/2007   Discharge Diagnoses:   Patient Active Problem List   Diagnosis Date Noted  . Chest discomfort 09/10/2013  . Bladder cancer 11/09/2012  . Mediastinal mass 09/28/2012  . Preoperative cardiovascular examination 09/11/2012  . Hilar mass   . Malignant tumor of urinary bladder 07/13/2012  . Chronic combined systolic and diastolic CHF (congestive heart failure) 06/28/2012  . Gout 05/17/2012  . Inappropriate shocks from ICD -secondary T wave oversensing in the context of hyperkalemia 04/12/2012  . Biventricular ICD-Medtronic 05/17/2011  . 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 ORTHOSTATIC DIZZINESS 11/03/2009  . VENTRICULAR TACHYCARDIA 10/20/2008  . SYSTOLIC HEART FAILURE, CHRONIC 10/20/2008  . DYSPNEA 03/25/2008  . ABNORMAL LUNG XRAY 03/25/2008  . OBSTRUCTIVE SLEEP APNEA 03/12/2007  . Essential hypertension, benign 03/12/2007  . CARDIOMYOPATHY  03/12/2007   Discharged Condition: good  History of Present Illness:   Mr. [name] is a 53 yo African American Male referred to [facility] for a slowly enlarging right mediastinal mass with compression of trachea and esophagus.  The patient has undergone a transbronchial EBUS which was non-diagnostic.  He also underwent a septal mediastinoscopy done by Dr. Prescott Gum which revealed thyroid tissue.  Due to these results patient underwent a thyroid scan which showed some activity in the Mediastinum.  He underwent suppressive therapy by Dr. Buddy Duty which was unsuccessful and the mass continued to enlarge.  There was some further concern that this could be a Lymphoma and patient underwent PET CT scan which showed hypermetabolic activity present in the mass and a left cervical lymph node.  He underwent lymph node biopsy by IR which showed lymphoid tissue, but there was not enough sample to perform cytology.  Due to a formal diagnosis not being obtained the patient was again referred to [facility] to discuss a Chamberlain procedure to obtain tissue for diagnosis.  The risks and benefits of the procedure were explained to the patient and he was agreeable to proceed.  The patient does have a long standing history of heart failure with an EF of 10%.  He also has a ICD in place.  It was felt he should be evaluated by the heart failure clinic prior to proceeding with surgery.   Hospital Course:   The patient presented to [facility] hospital on 09/10/2013.  He was taken to the operating room and underwent Mediastinotomy Chamberlain procedure.   The specimen was sent for frozen pathology which reported the presence of a vascular malformation.  He tolerated the procedure well and was extubated and taken to the SICU in stable condition.  The  patient has a history of renal insufficiency.  His creatinine was elevated post operatively and Nephrology consult was obtained.  Due to the nature of his mass, it was felt CT scan with contrast  would be indicated to further assess vascular involvement.  However, this could not be done immediately due to elevated creatinine.  The patient was followed by the Heart Failure team who monitored patient's diuretic regimen.  He was eventually started on Milrinone to help optimize renal perfusion.  His chest tube output decreased and his chest tubes were later removed without difficulty.  He was medically stable and transferred to the step down unit in stable condition.  The patient's follow up CXR did not show evidence of pneumothorax and the mediastinal mass was stable in appearance.  The patient has chronic complaints of dysphagia.  However, he felt it had gotten slightly worse during hospitalization.  Barium swallow was obtained which did not show evidence of abnormality.  He was placed on a soft diet and is eating without difficulty.  He developed right knee pain which was felt to be related to gout.  Uric Acid level was minimally elevated.  He was treated with steroids, colchicine, and Uloric which provided relief of patient's discomfort.  He was routinely followed by Nephrology during his hospitalization.  His creatinine has trended down to 1.5.  It was felt he would be appropriate for a contrast CT scan of his chest.  The results are posted below. His creatinine has remained stable.  He has been weaned off Milrinone.  He is medically stable for discharge and will be discharged home today 09/18/13.    Consults: cardiology and nephrology  Significant Diagnostic Studies: radiology:   CT scan:   1. Unchanged appearance of mottled heterogeneously enhancing  approximately 8.1 cm middle mediastinal mass. There is no definitive  hypertrophy of adjacent arteries or veins to suggest this mass  represents a vascular malformation. There is no definitive  macroscopic fat demonstrated within this mass and Dr. Tammi Klippel  (pathology), reviewed the acquired surgical pathologic the slides  and does not feel  acquired tissue from the surgical biopsy is  compatible with a hibernoma. As such, this mass remains  indeterminate and further evaluation could be performed with CT  guided biopsy as clinically indicated.  2. Interval development of a trace amount of loculated air within  the right lung apex and associated minimal amount of subcutaneous  emphysema within the anterior aspect of the right chest wall - the  sequela of prior open mediastinal mass biopsy.  3. Interval increase in small layering right-sided pleural effusion.  Above findings discussed with at Dr. Prescott Gum at 16:31.   Pathology: 1. Soft tissue mass, simple excision, Right paratracheal mass - BENIGN FIBROADIPOSE TISSUE. - MICROSCOPIC FOCUS OF LYMPHOID TISSUE. - SEE COMMENT. 2. Mediastinum, biopsy, Mass - BENIGN FIBROADIPOSE TISSUE. 3. Rib, segment(s), Right 3rd rib GROSS DIAGNOSIS ONLY: BONE.  Treatments: surgery:   Right anterior mediastinotomy (Chamberlain procedure) and biopsy of paratracheal mass.  Disposition: 01-Home or Self Care  Discharge Medications:       Medication List    STOP taking these medications       olmesartan 20 MG tablet  Commonly known as:  BENICAR      TAKE these medications       atorvastatin 40 MG tablet  Commonly known as:  LIPITOR  Take 40 mg by mouth at bedtime.     carvedilol 25 MG tablet  Commonly known as:  COREG  Take 1 tablet (25 mg total) by mouth 2 (two) times daily with a meal.     colchicine 0.6 MG tablet  Take 0.6 mg by mouth daily as needed (gout).     digoxin 0.25 MG tablet  Commonly known as:  LANOXIN  Take 0.5 tablets (0.125 mg total) by mouth daily.     febuxostat 40 MG tablet  Commonly known as:  ULORIC  Take 40 mg by mouth daily.     furosemide 40 MG tablet  Commonly known as:  LASIX  Take 1 tablet (40 mg total) by mouth every other day.  Start taking on:  09/20/2013     gabapentin 100 MG capsule  Commonly known as:  NEURONTIN  Take 2 capsules  (200 mg total) by mouth 2 (two) times daily.     levothyroxine 150 MCG tablet  Commonly known as:  SYNTHROID, LEVOTHROID  Take 150 mcg by mouth daily before breakfast.     losartan 25 MG tablet  Commonly known as:  COZAAR  Take 1 tablet (25 mg total) by mouth daily.     magnesium oxide 400 MG tablet  Commonly known as:  MAG-OX  Take 400 mg by mouth daily.     OVER THE COUNTER MEDICATION  Place 1 drop into both eyes daily as needed (red eyes). Eye drops     oxyCODONE 5 MG immediate release tablet  Commonly known as:  Oxy IR/ROXICODONE  Take 1 tablet (5 mg total) by mouth every 4 (four) hours as needed for severe pain.     predniSONE 10 MG tablet  Commonly known as:  DELTASONE  Take 1 tablet (10 mg total) by mouth daily with breakfast. For 2 Days, then discontinue medication     tobramycin-dexamethasone ophthalmic solution  Commonly known as:  TOBRADEX  Place 1 drop into the right eye 2 (two) times daily.          SignedEllwood Handler 09/17/2013, 11:51 AM

## 2013-09-17 NOTE — Progress Notes (Signed)
I saw the patient and agree with the above assessment and plan.    SCr at baselien and stable.  Pt appears optimized for CT w/ contrast.  Will follow along post contrast.

## 2013-09-17 NOTE — Progress Notes (Signed)
S: Some pain in his right knee, feels it has improved. Able to walk around, with putting too much weight on his right knee. No SOB, feels abdomen is back to baseline. No nausea or vomiting.  O:BP 147/96  Pulse 82  Temp(Src) 97.6 F (36.4 C) (Oral)  Resp 18  Ht 5\' 10"  (1.778 m)  Wt 259 lb 8 oz (117.708 kg)  BMI 37.23 kg/m2  SpO2 92%  Intake/Output Summary (Last 24 hours) at 09/17/13 0921 Last data filed at 09/17/13 0730  Gross per 24 hour  Intake    480 ml  Output    626 ml  Net   -146 ml   Intake/Output: I/O last 3 completed shifts: In: 480 [P.O.:480] Out: 1451 [Urine:1450; Stool:1]  Intake/Output this shift:  Total I/O In: 240 [P.O.:240] Out: -  Weight change: 11.2 oz (0.318 kg)  Physical Exam Gen: NAD, walked from bathroom to bedside recliner. Heent: AT, [place], neck supple, healing horizontal incision on his right chest. CVS: RRR Resp: clear to auscultation. Abd: Soft, full, non tender Ext: No pedal edema. Neuro: Alert and oriented, moves all extremities.  Recent Labs Lab 09/11/13 1532 09/12/13 0400 09/13/13 0358 09/14/13 0428 09/15/13 0500 09/16/13 0500 09/17/13 0549  NA 136* 137 135* 135* 135* 135* 135*  K 4.4 4.1 4.1 3.8 3.6* 4.1 4.3  CL 98 98 97 95* 95* 96 96  CO2 24 25 25 25 27 26 24   GLUCOSE 134* 137* 122* 103* 92 134* 121*  BUN 36* 37* 38* 40* 36* 34* 38*  CREATININE 2.66* 2.33* 2.07* 2.04* 1.79* 1.47* 1.51*  ALBUMIN  --  2.9* 2.9* 2.7* 2.5* 2.6* 2.7*  CALCIUM 8.2* 8.2* 8.2* 8.4 8.2* 9.0 8.9  AST  --  27 28 29 28 28  40*  ALT  --  18 18 18 18 20 30    Liver Function Tests:  Recent Labs Lab 09/15/13 0500 09/16/13 0500 09/17/13 0549  AST 28 28 40*  ALT 18 20 30   ALKPHOS 73 79 83  BILITOT 0.5 0.4 0.3  PROT 6.3 6.7 6.9  ALBUMIN 2.5* 2.6* 2.7*   CBC:  Recent Labs Lab 09/13/13 0358 09/14/13 0428 09/15/13 0500 09/16/13 0500 09/17/13 0549  WBC 17.2* 14.6* 12.3* 16.5* 21.7*  HGB 13.3 12.6* 11.8* 12.3* 13.1  HCT 39.8 36.9* 34.9* 35.9*  36.5*  MCV 86.0 85.2 84.3 83.5 83.5  PLT 166 174 176 221 273   Studies/Results: Dg Esophagus  09/16/2013   CLINICAL DATA:  Dysphagia after recent resection of chest mass.  EXAM: ESOPHOGRAM/BARIUM SWALLOW  TECHNIQUE: Single contrast examination was performed using  thin barium.  FLUOROSCOPY TIME:  2 min and 11 seconds  COMPARISON:  Chest CT of 08/27/2013  FINDINGS: Focused exam in LPO position was performed, secondary to patient clinical status and mild immobility. Evaluation of individual swallows demonstrates normal esophageal motility.  Full column evaluation of the esophagus demonstrates no focal narrowing to suggest stricture. No gross mass effect or esophageal displacement.  Hypopharyngeal portion of the exam is within normal limits. Series 40.  IMPRESSION: 1. Focused, mildly degraded exam as detailed above. 2. No anatomic explanation for patient's symptoms.   Electronically Signed   By: Abigail Miyamoto M.D.   On: 09/16/2013 09:34   . acetaminophen  1,000 mg Oral 4 times per day   Or  . acetaminophen (TYLENOL) oral liquid 160 mg/5 mL  1,000 mg Oral 4 times per day  . antiseptic oral rinse  15 mL Mouth Rinse q12n4p  . atorvastatin  40 mg Oral QHS  . bisacodyl  10 mg Oral Daily  . carvedilol  25 mg Oral BID WC  . cefTAZidime (FORTAZ)  IV  1 g Intravenous Q12H  . chlorhexidine  15 mL Mouth Rinse BID  . digoxin  0.125 mg Oral Daily  . enoxaparin (LOVENOX) injection  30 mg Subcutaneous Q24H  . febuxostat  40 mg Oral Daily  . gabapentin  200 mg Oral BID  . guaiFENesin  600 mg Oral BID  . levothyroxine  150 mcg Oral QAC breakfast  . magnesium oxide  400 mg Oral Daily  . predniSONE  20 mg Oral Q breakfast  . senna-docusate  1 tablet Oral QHS  . sodium chloride  10-20 mL Intravenous Q12H  . sodium chloride  3 mL Intravenous Q12H  . tobramycin-dexamethasone   Right Eye TID    BMET    Component Value Date/Time   NA 135* 09/17/2013 0549   K 4.3 09/17/2013 0549   CL 96 09/17/2013 0549   CO2 24  09/17/2013 0549   GLUCOSE 121* 09/17/2013 0549   BUN 38* 09/17/2013 0549   CREATININE 1.51* 09/17/2013 0549   CREATININE 1.76* 06/28/2012 1228   CALCIUM 8.9 09/17/2013 0549   GFRNONAA 51* 09/17/2013 0549   GFRAA 59* 09/17/2013 0549   CBC    Component Value Date/Time   WBC 21.7* 09/17/2013 0549   WBC 31.4* 06/28/2012 1259   RBC 4.37 09/17/2013 0549   RBC 5.74 06/28/2012 1259   HGB 13.1 09/17/2013 0549   HGB 16.8 06/28/2012 1259   HCT 36.5* 09/17/2013 0549   HCT 51.4 06/28/2012 1259   PLT 273 09/17/2013 0549   MCV 83.5 09/17/2013 0549   MCV 89.6 06/28/2012 1259   MCH 30.0 09/17/2013 0549   MCH 29.3 06/28/2012 1259   MCHC 35.9 09/17/2013 0549   MCHC 32.7 06/28/2012 1259   RDW 13.8 09/17/2013 0549   LYMPHSABS 2.1 06/29/2012 0540   MONOABS 2.7* 06/29/2012 0540   EOSABS 0.0 06/29/2012 0540   BASOSABS 0.0 06/29/2012 0540     Assessment/Plan:  1. Acute on chronic kidney injury- Resolved. Kidney function appears to be at baseline. Cr today- 1.51. Urine out put yesterday- 678mls. Last dose of lasix- 09/14/2013. Planned for possible contrast Ct-? Today.  2. Leukocytosis- Increased 21.7 today, without fever. Increased likely from starting prednisone for gout flare.  3. Hx of HTN, stable, on milrinone.   4. 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 For Ct scan with contrast.  5. CHF- EF- 15%- Management per cards.   6. Hypomagnesemia- 1.8 today. On daily suplimentation of Mag at home- continued on admission.   7. Gout Flare-  Home meds febuxostat and colchicine, continued on admission. IV prednisone 40mg  daily started yesterday for acute flare.   Kirkland Hun, PGY- 2

## 2013-09-17 NOTE — Progress Notes (Signed)
Pt refused to ambulate tonight d/t knee pain.  Will continue to encourage. Education reinforced.  Pt in chair with call bell within reach.  Will continue to monitor closely.

## 2013-09-17 NOTE — Progress Notes (Signed)
Encouraged pt to walk, pt refuses to walk stating "my knee still hurts", will continue to encourage Rickard Rhymes, RN

## 2013-09-17 NOTE — Progress Notes (Addendum)
      [address]       [place], [address]             [phone]      7 Days Post-Op Procedure(s) (LRB): MEDIASTINOTOMY CHAMBERLAIN MCNEIL (Right)  Subjective:  [name] has no complaints.  He is anxious to get his CT scan done.  His Barium swallow was negative.  He states he has been able to swallow better if he uses Chloraseptic spray prior to eating.  + ambulation + BM  Objective: Vital signs in last 24 hours: Temp:  [97.6 F (36.4 C)-98.2 F (36.8 C)] 97.6 F (36.4 C) (07/14 0452) Pulse Rate:  [74-83] 82 (07/14 0705) Cardiac Rhythm:  [-] Ventricular paced (07/13 2045) Resp:  [18] 18 (07/14 0452) BP: (131-147)/(65-96) 147/96 mmHg (07/14 0452) SpO2:  [92 %-95 %] 92 % (07/14 0452) Weight:  [259 lb 8 oz (117.708 kg)] 259 lb 8 oz (117.708 kg) (07/14 0452)  Intake/Output from previous day: 07/13 0701 - 07/14 0700 In: 480 [P.O.:480] Out: 626 [Urine:625; Stool:1]  General appearance: alert, cooperative and no distress Heart: regular rate and rhythm Lungs: clear to auscultation bilaterally Abdomen: soft, non-tender; bowel sounds normal; no masses,  no organomegaly Wound: clean and dry  Lab Results:  Recent Labs  09/16/13 0500 09/17/13 0549  WBC 16.5* 21.7*  HGB 12.3* 13.1  HCT 35.9* 36.5*  PLT 221 273   BMET:  Recent Labs  09/16/13 0500 09/17/13 0549  NA 135* 135*  K 4.1 4.3  CL 96 96  CO2 26 24  GLUCOSE 134* 121*  BUN 34* 38*  CREATININE 1.47* 1.51*  CALCIUM 9.0 8.9    PT/INR: No results found for this basename: LABPROT, INR,  in the last 72 hours ABG    Component Value Date/Time   PHART 7.364 09/11/2013 0433   HCO3 23.3 09/11/2013 0433   TCO2 25 09/11/2013 0433   ACIDBASEDEF 2.0 09/11/2013 0433   O2SAT 73.4 09/17/2013 0552   CBG (last 3)  No results found for this basename: GLUCAP,  in the last 72 hours  Assessment/Plan: S/P Procedure(s) (LRB): MEDIASTINOTOMY CHAMBERLAIN MCNEIL (Right)  1. Mediastinal Mass- consistent  with vascular malformation 2. CV- remains on Milrinone, Coreg, Digoxin- advance HF team following 3. Renal- CKD Stage IV, creatinine down to 1.51, will plan for CT scan today 4. Gout- continue Solumedrol, Colchicine, Uloric- will transition to oral prednisone tomorrow 5. Dispo- patient stable, for CT scan today, continue current care   LOS: 7 days    BARRETT, ERIN 09/17/2013  Postop CXR stable, incision healing with mild surgical pain H will be ready to go home tomorrow after CT chest with contrast done  patient examined and medical record reviewed,agree with above note. [name],Breniyah Romm 09/17/2013

## 2013-09-17 NOTE — Progress Notes (Signed)
Pt has all equipment in room and states he will place himself on CPAP. RT available if needed.

## 2013-09-17 NOTE — Care Management Note (Unsigned)
    Page 1 of 1   09/17/2013     3:26:20 PM CARE MANAGEMENT NOTE 09/17/2013  Patient:  [name], [name]   Account Number:  [id number]  Date Initiated:  09/16/2013  Documentation initiated by:  Deolinda Frid  Subjective/Objective Assessment:   Pt s/p [name] procedure and biopsy of paratracheal mass on 09/10/13.  PTA, pt resides at home with spouse.     Action/Plan:   Will follow for dc needs as pt progresses.   Anticipated DC Date:  09/20/2013   Anticipated DC Plan:  [place]  CM consult      Choice offered to / List presented to:             Status of service:  In process, will continue to follow Medicare Important Message given?   (If response is "NO", the following Medicare IM given date fields will be blank) Date Medicare IM given:   Medicare IM given by:   Date Additional Medicare IM given:   Additional Medicare IM given by:    Discharge Disposition:    Per UR Regulation:  Reviewed for med. necessity/level of care/duration of stay  If discussed at [place] of Stay Meetings, dates discussed:    Comments:

## 2013-09-18 LAB — CARBOXYHEMOGLOBIN
Carboxyhemoglobin: 1.3 % (ref 0.5–1.5)
Methemoglobin: 0.6 % (ref 0.0–1.5)
O2 Saturation: 73.9 %
Total hemoglobin: 12.9 g/dL — ABNORMAL LOW (ref 13.5–18.0)

## 2013-09-18 LAB — CBC
HCT: 36.4 % — ABNORMAL LOW (ref 39.0–52.0)
Hemoglobin: 12.4 g/dL — ABNORMAL LOW (ref 13.0–17.0)
MCH: 28.7 pg (ref 26.0–34.0)
MCHC: 34.1 g/dL (ref 30.0–36.0)
MCV: 84.3 fL (ref 78.0–100.0)
Platelets: 277 10*3/uL (ref 150–400)
RBC: 4.32 MIL/uL (ref 4.22–5.81)
RDW: 13.9 % (ref 11.5–15.5)
WBC: 15.6 10*3/uL — ABNORMAL HIGH (ref 4.0–10.5)

## 2013-09-18 LAB — COMPREHENSIVE METABOLIC PANEL
ALT: 47 U/L (ref 0–53)
AST: 51 U/L — ABNORMAL HIGH (ref 0–37)
Albumin: 2.7 g/dL — ABNORMAL LOW (ref 3.5–5.2)
Alkaline Phosphatase: 76 U/L (ref 39–117)
Anion gap: 14 (ref 5–15)
BUN: 35 mg/dL — ABNORMAL HIGH (ref 6–23)
CO2: 25 mEq/L (ref 19–32)
Calcium: 8.7 mg/dL (ref 8.4–10.5)
Chloride: 101 mEq/L (ref 96–112)
Creatinine, Ser: 1.35 mg/dL (ref 0.50–1.35)
GFR calc Af Amer: 67 mL/min — ABNORMAL LOW (ref >90)
GFR calc non Af Amer: 58 mL/min — ABNORMAL LOW (ref >90)
Glucose, Bld: 94 mg/dL (ref 70–99)
Potassium: 4 mEq/L (ref 3.7–5.3)
Sodium: 140 mEq/L (ref 137–147)
Total Bilirubin: 0.2 mg/dL — ABNORMAL LOW (ref 0.3–1.2)
Total Protein: 6.8 g/dL (ref 6.0–8.3)

## 2013-09-18 MED ORDER — OXYCODONE HCL 5 MG PO TABS: 5.0000 mg | ORAL_TABLET | ORAL | Status: DC | PRN

## 2013-09-18 MED ORDER — PREDNISONE 10 MG PO TABS: 10.0000 mg | ORAL_TABLET | Freq: Every day | ORAL | Status: DC

## 2013-09-18 MED ORDER — FUROSEMIDE 40 MG PO TABS: 40.0000 mg | ORAL_TABLET | ORAL | Status: DC

## 2013-09-18 MED ORDER — LOSARTAN POTASSIUM 25 MG PO TABS: 25.0000 mg | ORAL_TABLET | Freq: Every day | ORAL | Status: DC

## 2013-09-18 MED ORDER — GABAPENTIN 100 MG PO CAPS: 200.0000 mg | ORAL_CAPSULE | Freq: Two times a day (BID) | ORAL | Status: DC

## 2013-09-18 NOTE — Discharge Instructions (Signed)
Please resume Lasix on Friday 09/20/2013    Incision Care  An incision is a surgical cut to open your skin. You need to take care of your incision. This helps you to not get an infection. HOME CARE  Only take medicine as told by your doctor.  Do not take off your bandage (dressing) or get your incision wet until your doctor approves. Change the bandage and call your doctor if the bandage gets wet, dirty, or starts to smell.  Take showers. Do not take baths, swim, or do anything that may soak your incision until it heals.  Return to your normal diet and activities as told or allowed by your doctor.  Avoid lifting any weight until your doctor approves.  Put medicine that helps lessen itching on your incision as told by your doctor. Do not pick or scratch at your incision.  Keep your doctor visit to have your stitches (sutures) or staples removed.  Drink enough fluids to keep your pee (urine) clear or pale yellow. GET HELP RIGHT AWAY IF:  You have a fever.  You have a rash.  You are dizzy, or you pass out (faint) while standing.  You have trouble breathing.  You have a reaction or side effects to medicine given to you.  You have redness, puffiness (swelling), or more pain in the incision and medicine does not help.  You have fluid, blood, or yellowish-white fluid (pus) coming from the incision lasting over 1 day.  You have muscle aches, chills, or you feel sick.  You have a bad smell coming from the incision or bandage.  Your incision opens up after stitches, staples, or sticky strips have been removed.  You keep feeling sick to your stomach (nauseous) or keep throwing up (vomiting). MAKE SURE YOU:   Understand these instructions.  Will watch your condition.  Will get help right away if you are not doing well or get worse. Document Released: 05/16/2011 Document Reviewed: 05/16/2011 [facility] Patient Information 2015 [place]. This information is not intended to  replace advice given to you by your health care provider. Make sure you discuss any questions you have with your health care provider.

## 2013-09-18 NOTE — Progress Notes (Signed)
Pt refusing to ambulate stating that his right knee is still too sore, will continue to monitor and encourage Rickard Rhymes, RN

## 2013-09-18 NOTE — Progress Notes (Addendum)
[address] BostonSuite [address]       [place] [address]             [phone]      8 Days Post-Op Procedure(s) (LRB): MEDIASTINOTOMY CHAMBERLAIN MCNEIL (Right) Subjective: Feels well  Objective: Vital signs in last 24 hours: Temp:  [97.9 F (36.6 C)-98.2 F (36.8 C)] 98.2 F (36.8 C) (07/15 0500) Pulse Rate:  [70-75] 74 (07/15 0753) Cardiac Rhythm:  [-] Ventricular paced (07/15 0749) Resp:  [18] 18 (07/15 0500) BP: (123-144)/(68-84) 123/68 mmHg (07/15 0753) SpO2:  [96 %-97 %] 97 % (07/15 0500) Weight:  [256 lb 13.4 oz (116.5 kg)] 256 lb 13.4 oz (116.5 kg) (07/15 0500)  Hemodynamic parameters for last 24 hours:    Intake/Output from previous day: 07/14 0701 - 07/15 0700 In: 480 [P.O.:480] Out: 1101 [Urine:1100; Stool:1] Intake/Output this shift:    General appearance: alert, cooperative and no distress Heart: regular rate and rhythm Lungs: clear to auscultation bilaterally Abdomen: benign Extremities: min edema Wound: incis healing well  Lab Results:  Recent Labs  09/17/13 0549 09/18/13 0500  WBC 21.7* 15.6*  HGB 13.1 12.4*  HCT 36.5* 36.4*  PLT 273 277   BMET:  Recent Labs  09/17/13 0549 09/18/13 0500  NA 135* 140  K 4.3 4.0  CL 96 101  CO2 24 25  GLUCOSE 121* 94  BUN 38* 35*  CREATININE 1.51* 1.35  CALCIUM 8.9 8.7    PT/INR: No results found for this basename: LABPROT, INR,  in the last 72 hours ABG    Component Value Date/Time   PHART 7.364 09/11/2013 0433   HCO3 23.3 09/11/2013 0433   TCO2 25 09/11/2013 0433   ACIDBASEDEF 2.0 09/11/2013 0433   O2SAT 73.9 09/18/2013 0710   CBG (last 3)  No results found for this basename: GLUCAP,  in the last 72 hours . milrinone 0.25 mcg/kg/min (09/17/13 1718)   Scheduled Meds: . acetaminophen  1,000 mg Oral 4 times per day   Or  . acetaminophen (TYLENOL) oral liquid 160 mg/5 mL  1,000 mg Oral 4 times per day  . antiseptic oral rinse  15 mL Mouth Rinse q12n4p  . atorvastatin  40 mg Oral QHS    . bisacodyl  10 mg Oral Daily  . carvedilol  25 mg Oral BID WC  . cefTAZidime (FORTAZ)  IV  1 g Intravenous Q12H  . chlorhexidine  15 mL Mouth Rinse BID  . digoxin  0.125 mg Oral Daily  . enoxaparin (LOVENOX) injection  30 mg Subcutaneous Q24H  . febuxostat  40 mg Oral Daily  . gabapentin  200 mg Oral BID  . guaiFENesin  600 mg Oral BID  . levothyroxine  150 mcg Oral QAC breakfast  . magnesium oxide  400 mg Oral Daily  . predniSONE  20 mg Oral Q breakfast  . senna-docusate  1 tablet Oral QHS  . sodium chloride  10-20 mL Intravenous Q12H  . sodium chloride  3 mL Intravenous Q12H  . tobramycin-dexamethasone   Right Eye TID   Continuous Infusions: . milrinone 0.25 mcg/kg/min (09/17/13 1718)   PRN Meds:.acetaminophen, colchicine, oxyCODONE, phenol, sodium chloride, sodium chloride, sodium chloride  Ct Chest W Wo Contrast  09/17/2013   CLINICAL DATA:  History of bladder cancer, post open biopsy of indeterminate hypermetabolic middle mediastinal mass with morphologic findings worrisome for a vascular malformation. Patient with chronic renal insufficiency, though renal function has improved and request made for a contrast-enhanced chest CT  for further evaluation.  EXAM: CT CHEST WITHOUT AND WITH CONTRAST  TECHNIQUE: Multidetector CT imaging of the chest was performed following the standard protocol before, during after the bolus administration of intravenous contrast.  CONTRAST:  153mL OMNIPAQUE IOHEXOL 300 MG/ML  SOLN  COMPARISON:  Chest CT- 03/09/2008; 06/28/2013; 08/27/2013; PET-CT - 07/23/2013.  FINDINGS: Vascular Findings:  The known mediastinal mass is unchanged in size measuring 8.1 x 6.4 x 7.0 cm (as measured in greatest oblique axial and craniocaudal dimensions respectively - axial image 14, series 401; coronal image 73, series 406). This mass demonstrates heterogeneous, slightly mottled enhancement (measuring 41 Hounsfield units on the precontrast images- image 14, series 21, an  approximately 94 Hounsfield units on the 70 second delayed images - image 14, series ). There is persistent mottled heterogeneous hypo attenuation within the central aspect of this mass which may be indicative of central necrosis. There is no definitive macroscopic fat demonstrated within this mass (representative coronal image 77, series fall 6) mass.  This mass appears separate from the adjacent vasculature, in particular, the fat plane between the cranial aspect of the azygos vein and the caudal aspect of the mass appears preserved (coronal image 75, series 406; sagittal image 60, series 407). There is no definitive hypertrophy of the azygos system or the adjacent intercostal artery or veins. There are no definitive hypertrophied arterial or venous collaterals supplying or draining this mass. There is grossly unchanged mass effect with mild leftward deviation of the tracheal air column adjacent to the medial border of the mass.  The central venous system of the bilateral upper extremities is widely patent to the level of the right atrium.  Cardiomegaly. Left anterior chest wall 3 lead pacemaker. Although this examination was not tailored for the evaluation of pulmonary arteries, there are no discrete filling defects within pulmonary arterial tree to suggest pulmonary embolism. Caliber the main pulmonary artery remains enlarged measuring 4.4 cm in diameter.  Normal caliber the thoracic aorta. No definite thoracic aortic dissection or periaortic stranding. Conventional configuration of the aortic arch. The branch vessels of the aortic arch widely patent throughout their imaged course.   ----------------------------------------------------------------------------------  Nonvascular Findings:  Interval increase in size of small layering right-sided pleural effusion. There has been interval development of a trace amount of loculated air within the right lung apex (axial image 82, series 202), the sequela of prior open  mediastinal mass biopsy. These findings are also associated with a minimal amount of subcutaneous emphysema about the right anterior chest wall.  Multiple punctate granulomas are seen scattered throughout the bilateral lungs, the sequela of prior granulomatous infection.  Limited evaluation of the upper abdomen demonstrates a subcentimeter (approximately 0.9 cm) hypo attenuating lesion within the superior pole the left kidney (image 62, series 41), incompletely imaged. Post cholecystectomy.  No acute or aggressive osseous abnormalities. Stigmata of DISH within the caudal aspect of the thoracic spine. Imaged portions of the thyroid gland are normal.  IMPRESSION: 1. Unchanged appearance of mottled heterogeneously enhancing approximately 8.1 cm middle mediastinal mass. There is no definitive hypertrophy of adjacent arteries or veins to suggest this mass represents a vascular malformation. There is no definitive macroscopic fat demonstrated within this mass and Dr. Tammi Klippel (pathology), reviewed the acquired surgical pathologic the slides and does not feel acquired tissue from the surgical biopsy is compatible with a hibernoma. As such, this mass remains indeterminate and further evaluation could be performed with CT guided biopsy as clinically indicated. 2. Interval development of a trace amount  of loculated air within the right lung apex and associated minimal amount of subcutaneous emphysema within the anterior aspect of the right chest wall - the sequela of prior open mediastinal mass biopsy. 3. Interval increase in small layering right-sided pleural effusion. Above findings discussed with at Dr. Prescott Gum at 16:31.   Electronically Signed   By: Sandi Mariscal M.D.   On: 09/17/2013 16:35      Assessment/Plan: S/P Procedure(s) (LRB): MEDIASTINOTOMY CHAMBERLAIN MCNEIL (Right)  1 clinically quite stable 2 milrinone to be weaned per heart failure team, holding on lasix for 48 hours, creat is 1.3 3 CT scan  results noted 4 leukocytosis trending down, can prob stop fortaz after today 5 push rehab/pulm toilet    LOS: 8 days    GOLD,Alver E 09/18/2013  Discharge home Results of CT scan of chest reviewed with patient Posterior superior mediastinal tumor enhances with IV contrast but is not a true vascular malformation or hemangioma with feeding vessels With progressive increase in size and tracheal deviation resection will be needed I feel the risk of transthoracic needle biopsy by interventional radiology to attempt a tissue diagnosis would have high-risk of bleeding based on the vascular nature of the tumor at biopsy from [facility] procedure  Patient will probably need a posterior lateral thoracotomy for resection He will need to recover from his [name] procedure-biopsy first Ready for discharge home and I will follow him up in the office.

## 2013-09-18 NOTE — Progress Notes (Signed)
I saw the patient and agree with the above assessment and plan.     Plans with cardiology and CVTS noted.  GFR stable, good UOP.  Will arrange f/u labs for Friday as outpt.  RS

## 2013-09-18 NOTE — Progress Notes (Signed)
Discharge medication, education, prescriptions, and exit care reviewed with pt, pt stated understanding and that he had no questions, new medication education provided, pt stated he had no questions Rickard Rhymes, RN

## 2013-09-18 NOTE — Progress Notes (Signed)
Chest tube sutures removed per order and protocol, steri-strips applied, pt tolerated well, incision care given to pt, peripheral IV removed, family made aware of pt discharge Rickard Rhymes, RN

## 2013-09-18 NOTE — Progress Notes (Signed)
Right central Line discontinue per protocol, pt lying flat for 30 minutes, will continue to monitor closely Rickard Rhymes, RN

## 2013-09-18 NOTE — Progress Notes (Signed)
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   He is intoelrant to numerous HF meds including spiro (severe hyperkalemia - requiring HD), hydralazine (fuzzy-headed) and imdur (severe HAs).   Recently found to have enlarging mediastinal mass now with compression of trachea and esophagus. Last year a transbronchial EBUS biopsy performed by pulmonary was nondiagnostic. He then had a mediastinoscopy which returned thyroid tissue. A thyroid scan subtotally showed some activity in the mediastinum and he underwent thyroid suppressive therapy by Dr. Buddy Duty without success and in fact the mass is enlarging. Because of concern this could relieve be a lymphoma PET scan was performed showing hypermetabolic activity as well as hypermetabolic activity of a left cervical lymph node. The lymph node was biopsied by IR and this showed lymphoid tissue but scant sample size, not enough to perform flow cytometry.   Admitted and underwent R mediastinoscopy- bx mediastinal mass- c/w vascular malformation.   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 Recommended further imaging with contrast CT. Started on milrinone in an attempt to improve renal function. Had CT yesterday with indeterminate findings.    Ongoing RLE pain but better. Day 4 of solumedrol.   Denies SOB   Objective:   Weight Range:  Vital Signs:   Temp:  [97.9 F (36.6 C)-98.2 F (36.8 C)] 98.2 F (36.8 C) (07/15 0500) Pulse Rate:  [70-75] 74 (07/15 0753) Resp:  [18] 18 (07/15 0500) BP: (123-144)/(68-84) 123/68 mmHg (07/15 0753) SpO2:   [96 %-97 %] 97 % (07/15 0500) Weight:  [256 lb 13.4 oz (116.5 kg)] 256 lb 13.4 oz (116.5 kg) (07/15 0500) Last BM Date: 09/17/13  Weight change: Filed Weights   09/16/13 0536 09/17/13 0452 09/18/13 0500  Weight: 258 lb 12.8 oz (117.391 kg) 259 lb 8 oz (117.708 kg) 256 lb 13.4 oz (116.5 kg)    Intake/Output:   Intake/Output Summary (Last 24 hours) at 09/18/13 0836 Last data filed at 09/18/13 0622  Gross per 24 hour  Intake    240 ml  Output   1101 ml  Net   -861 ml     Physical Exam: General:  Sitting in chair No resp difficulty HEENT: normal Neck: supple. JVP 6-7 RIJ 2L central line; Carotids 2+ bilat; no bruits. No lymphadenopathy or thryomegaly appreciated. Cor: PMI nondisplaced. Regular rate & rhythm. No rubs, gallops or murmurs. Lungs: clear.  Abdomen: soft, nontender, non distended. Good bowel sounds. Extremities: no cyanosis, clubbing, rash, tr edema R knee warm and tender to touch Neuro: alert & orientedx3, cranial nerves grossly intact. moves all 4 extremities w/o difficulty. Affect pleasant Skin: R upper chest incision approximated.   Telemetry: SR  Labs: Basic Metabolic Panel:  Recent Labs Lab 09/12/13 1016  09/14/13 0428 09/15/13 0500 09/16/13 0500 09/17/13 0549 09/18/13 0500  NA  --   < > 135* 135* 135* 135* 140  K  --   < > 3.8 3.6* 4.1 4.3 4.0  CL  --   < > 95* 95* 96 96 101  CO2  --   < > 25 27 26 24 25   GLUCOSE  --   < >  103* 92 134* 121* 94  BUN  --   < > 40* 36* 34* 38* 35*  CREATININE  --   < > 2.04* 1.79* 1.47* 1.51* 1.35  CALCIUM  --   < > 8.4 8.2* 9.0 8.9 8.7  MG 1.2*  --  2.0  --  1.7 1.8  --   < > = values in this interval not displayed.  Liver Function Tests:  Recent Labs Lab 09/14/13 0428 09/15/13 0500 09/16/13 0500 09/17/13 0549 09/18/13 0500  AST 29 28 28  40* 51*  ALT 18 18 20 30  47  ALKPHOS 84 73 79 83 76  BILITOT 0.7 0.5 0.4 0.3 0.2*  PROT 6.8 6.3 6.7 6.9 6.8  ALBUMIN 2.7* 2.5* 2.6* 2.7* 2.7*   No results found for  this basename: LIPASE, AMYLASE,  in the last 168 hours No results found for this basename: AMMONIA,  in the last 168 hours  CBC:  Recent Labs Lab 09/14/13 0428 09/15/13 0500 09/16/13 0500 09/17/13 0549 09/18/13 0500  WBC 14.6* 12.3* 16.5* 21.7* 15.6*  HGB 12.6* 11.8* 12.3* 13.1 12.4*  HCT 36.9* 34.9* 35.9* 36.5* 36.4*  MCV 85.2 84.3 83.5 83.5 84.3  PLT 174 176 221 273 277    Cardiac Enzymes: No results found for this basename: CKTOTAL, CKMB, CKMBINDEX, TROPONINI,  in the last 168 hours  BNP: BNP (last 3 results) No results found for this basename: PROBNP,  in the last 8760 hours   Imaging: Ct Chest W Wo Contrast  09/17/2013   CLINICAL DATA:  History of bladder cancer, post open biopsy of indeterminate hypermetabolic middle mediastinal mass with morphologic findings worrisome for a vascular malformation. Patient with chronic renal insufficiency, though renal function has improved and request made for a contrast-enhanced chest CT for further evaluation.  EXAM: CT CHEST WITHOUT AND WITH CONTRAST  TECHNIQUE: Multidetector CT imaging of the chest was performed following the standard protocol before, during after the bolus administration of intravenous contrast.  CONTRAST:  13mL OMNIPAQUE IOHEXOL 300 MG/ML  SOLN  COMPARISON:  Chest CT- 03/09/2008; 06/28/2013; 08/27/2013; PET-CT - 07/23/2013.  FINDINGS: Vascular Findings:  The known mediastinal mass is unchanged in size measuring 8.1 x 6.4 x 7.0 cm (as measured in greatest oblique axial and craniocaudal dimensions respectively - axial image 14, series 401; coronal image 73, series 406). This mass demonstrates heterogeneous, slightly mottled enhancement (measuring 41 Hounsfield units on the precontrast images- image 14, series 21, an approximately 94 Hounsfield units on the 70 second delayed images - image 14, series ). There is persistent mottled heterogeneous hypo attenuation within the central aspect of this mass which may be indicative of  central necrosis. There is no definitive macroscopic fat demonstrated within this mass (representative coronal image 77, series fall 6) mass.  This mass appears separate from the adjacent vasculature, in particular, the fat plane between the cranial aspect of the azygos vein and the caudal aspect of the mass appears preserved (coronal image 75, series 406; sagittal image 60, series 407). There is no definitive hypertrophy of the azygos system or the adjacent intercostal artery or veins. There are no definitive hypertrophied arterial or venous collaterals supplying or draining this mass. There is grossly unchanged mass effect with mild leftward deviation of the tracheal air column adjacent to the medial border of the mass.  The central venous system of the bilateral upper extremities is widely patent to the level of the right atrium.  Cardiomegaly. Left anterior chest wall 3 lead pacemaker. Although this  examination was not tailored for the evaluation of pulmonary arteries, there are no discrete filling defects within pulmonary arterial tree to suggest pulmonary embolism. Caliber the main pulmonary artery remains enlarged measuring 4.4 cm in diameter.  Normal caliber the thoracic aorta. No definite thoracic aortic dissection or periaortic stranding. Conventional configuration of the aortic arch. The branch vessels of the aortic arch widely patent throughout their imaged course.   ----------------------------------------------------------------------------------  Nonvascular Findings:  Interval increase in size of small layering right-sided pleural effusion. There has been interval development of a trace amount of loculated air within the right lung apex (axial image 82, series 202), the sequela of prior open mediastinal mass biopsy. These findings are also associated with a minimal amount of subcutaneous emphysema about the right anterior chest wall.  Multiple punctate granulomas are seen scattered throughout the  bilateral lungs, the sequela of prior granulomatous infection.  Limited evaluation of the upper abdomen demonstrates a subcentimeter (approximately 0.9 cm) hypo attenuating lesion within the superior pole the left kidney (image 62, series 41), incompletely imaged. Post cholecystectomy.  No acute or aggressive osseous abnormalities. Stigmata of DISH within the caudal aspect of the thoracic spine. Imaged portions of the thyroid gland are normal.  IMPRESSION: 1. Unchanged appearance of mottled heterogeneously enhancing approximately 8.1 cm middle mediastinal mass. There is no definitive hypertrophy of adjacent arteries or veins to suggest this mass represents a vascular malformation. There is no definitive macroscopic fat demonstrated within this mass and Dr. Tammi Klippel (pathology), reviewed the acquired surgical pathologic the slides and does not feel acquired tissue from the surgical biopsy is compatible with a hibernoma. As such, this mass remains indeterminate and further evaluation could be performed with CT guided biopsy as clinically indicated. 2. Interval development of a trace amount of loculated air within the right lung apex and associated minimal amount of subcutaneous emphysema within the anterior aspect of the right chest wall - the sequela of prior open mediastinal mass biopsy. 3. Interval increase in small layering right-sided pleural effusion. Above findings discussed with at Dr. Prescott Gum at 16:31.   Electronically Signed   By: Sandi Mariscal M.D.   On: 09/17/2013 16:35   Dg Esophagus  09/16/2013   CLINICAL DATA:  Dysphagia after recent resection of chest mass.  EXAM: ESOPHOGRAM/BARIUM SWALLOW  TECHNIQUE: Single contrast examination was performed using  thin barium.  FLUOROSCOPY TIME:  2 min and 11 seconds  COMPARISON:  Chest CT of 08/27/2013  FINDINGS: Focused exam in LPO position was performed, secondary to patient clinical status and mild immobility. Evaluation of individual swallows demonstrates  normal esophageal motility.  Full column evaluation of the esophagus demonstrates no focal narrowing to suggest stricture. No gross mass effect or esophageal displacement.  Hypopharyngeal portion of the exam is within normal limits. Series 40.  IMPRESSION: 1. Focused, mildly degraded exam as detailed above. 2. No anatomic explanation for patient's symptoms.   Electronically Signed   By: Abigail Miyamoto M.D.   On: 09/16/2013 09:34     Medications:     Scheduled Medications: . acetaminophen  1,000 mg Oral 4 times per day   Or  . acetaminophen (TYLENOL) oral liquid 160 mg/5 mL  1,000 mg Oral 4 times per day  . antiseptic oral rinse  15 mL Mouth Rinse q12n4p  . atorvastatin  40 mg Oral QHS  . bisacodyl  10 mg Oral Daily  . carvedilol  25 mg Oral BID WC  . cefTAZidime (FORTAZ)  IV  1 g Intravenous Q12H  .  chlorhexidine  15 mL Mouth Rinse BID  . digoxin  0.125 mg Oral Daily  . enoxaparin (LOVENOX) injection  30 mg Subcutaneous Q24H  . febuxostat  40 mg Oral Daily  . gabapentin  200 mg Oral BID  . guaiFENesin  600 mg Oral BID  . levothyroxine  150 mcg Oral QAC breakfast  . magnesium oxide  400 mg Oral Daily  . predniSONE  20 mg Oral Q breakfast  . senna-docusate  1 tablet Oral QHS  . sodium chloride  10-20 mL Intravenous Q12H  . sodium chloride  3 mL Intravenous Q12H  . tobramycin-dexamethasone   Right Eye TID    Infusions: . milrinone 0.25 mcg/kg/min (09/17/13 1718)    PRN Medications: acetaminophen, colchicine, oxyCODONE, phenol, sodium chloride, sodium chloride, sodium chloride   Assessment:   1) Chronic systolic HF - EF 78% (04/4233) 2) NICM - s/p Medtronic ICD 3) S/P R anterior mediastinotomy. Mediastinal mass c/w vascular malformation 4) Acute on chronic renal failure, stage IV 5) Bladder Cancer 6) Hypomagnesemia - 09/12/13 Magnesium 1.2 replaced  7) Acute gout.   Plan/Discussion:   POD #7 R anterior mediastinotomy   Had CT will wean Milrinone. CO-OX 73 %.  Renal  function ok. Creatinine 1.3  Hold off diuretics at least another 48 hours.   Had CT with Dr Pascal Lux in IR.  Indeterminate findings noted.   Continue carvedilol 25 mg bid and dig 0.125 mg daily. Dig level 0.4. Not on hydralazine due to dizziness or Imdur due to headaches. Also intolerant spironolactone due to hyperkalemia.   Magnesium 09/14/13 1.2  Supplemented and rechecked levels have been ok.   Day #4 Solumedrol for gout. Continue colchicine and uloric.   Barium Swallow.  No anatomic problems noted   WBC trending down 21>15 Afebrile on steriods. On Fortaz day # 7   CLEGG,AMY,NP-C  8:36 AM  Patient seen with NP, agree with the above note.  Renal function stable. May stop milrinone today.  Would continue home meds except send home on losartan 25 mg daily rather than Benicar.  From my standpoint, could go home today and resume prior home dose of Lasix on Friday.  Plan regarding mass per Dr. Prescott Gum.   Loralie Champagne 09/18/2013 9:24 AM

## 2013-09-18 NOTE — Progress Notes (Signed)
S: No complaints today. Wants to know what his blood work shows as regards to his kidneys. Will like to go home today.  O:BP 123/68  Pulse 74  Temp(Src) 98.2 F (36.8 C) (Oral)  Resp 18  Ht 5\' 10"  (1.778 m)  Wt 256 lb 13.4 oz (116.5 kg)  BMI 36.85 kg/m2  SpO2 97%  Intake/Output Summary (Last 24 hours) at 09/18/13 0858 Last data filed at 09/18/13 0622  Gross per 24 hour  Intake    240 ml  Output   1101 ml  Net   -861 ml   Intake/Output: I/O last 3 completed shifts: In: 480 [P.O.:480] Out: 1726 [Urine:1725; Stool:1]  Intake/Output this shift:    Weight change: -2 lb 10.6 oz (-1.209 kg)  Physical Exam Gen: Sitting in bedside recliner. Heent: AT, [place], neck supple, healing horizontal incision on his right chest. CVS: RRR Resp: No wheezes or crackles.  Abd: Soft, full, non tender Ext: Trace pedal edema. Neuro: Alert and oriented, moves all extremities.  Recent Labs Lab 09/12/13 0400 09/13/13 0358 09/14/13 0428 09/15/13 0500 09/16/13 0500 09/17/13 0549 09/18/13 0500  NA 137 135* 135* 135* 135* 135* 140  K 4.1 4.1 3.8 3.6* 4.1 4.3 4.0  CL 98 97 95* 95* 96 96 101  CO2 25 25 25 27 26 24 25   GLUCOSE 137* 122* 103* 92 134* 121* 94  BUN 37* 38* 40* 36* 34* 38* 35*  CREATININE 2.33* 2.07* 2.04* 1.79* 1.47* 1.51* 1.35  ALBUMIN 2.9* 2.9* 2.7* 2.5* 2.6* 2.7* 2.7*  CALCIUM 8.2* 8.2* 8.4 8.2* 9.0 8.9 8.7  AST 27 28 29 28 28  40* 51*  ALT 18 18 18 18 20 30  47   Liver Function Tests:  Recent Labs Lab 09/16/13 0500 09/17/13 0549 09/18/13 0500  AST 28 40* 51*  ALT 20 30 47  ALKPHOS 79 83 76  BILITOT 0.4 0.3 0.2*  PROT 6.7 6.9 6.8  ALBUMIN 2.6* 2.7* 2.7*   CBC:  Recent Labs Lab 09/14/13 0428 09/15/13 0500 09/16/13 0500 09/17/13 0549 09/18/13 0500  WBC 14.6* 12.3* 16.5* 21.7* 15.6*  HGB 12.6* 11.8* 12.3* 13.1 12.4*  HCT 36.9* 34.9* 35.9* 36.5* 36.4*  MCV 85.2 84.3 83.5 83.5 84.3  PLT 174 176 221 273 277   Studies/Results: Ct Chest W Wo Contrast  09/17/2013    CLINICAL DATA:    IMPRESSION: 1. Unchanged appearance of mottled heterogeneously enhancing approximately 8.1 cm middle mediastinal mass. There is no definitive hypertrophy of adjacent arteries or veins to suggest this mass represents a vascular malformation. There is no definitive macroscopic fat demonstrated within this mass and Dr. Tammi Klippel (pathology), reviewed the acquired surgical pathologic the slides and does not feel acquired tissue from the surgical biopsy is compatible with a hibernoma. As such, this mass remains indeterminate and further evaluation could be performed with CT guided biopsy as clinically indicated. 2. Interval development of a trace amount of loculated air within the right lung apex and associated minimal amount of subcutaneous emphysema within the anterior aspect of the right chest wall - the sequela of prior open mediastinal mass biopsy. 3. Interval increase in small layering right-sided pleural effusion. Above findings discussed with at Dr. Prescott Gum at 16:31.   Electronically Signed   By: Sandi Mariscal M.D.   On: 09/17/2013 16:35   . acetaminophen  1,000 mg Oral 4 times per day   Or  . acetaminophen (TYLENOL) oral liquid 160 mg/5 mL  1,000 mg Oral 4 times per day  .  antiseptic oral rinse  15 mL Mouth Rinse q12n4p  . atorvastatin  40 mg Oral QHS  . bisacodyl  10 mg Oral Daily  . carvedilol  25 mg Oral BID WC  . cefTAZidime (FORTAZ)  IV  1 g Intravenous Q12H  . chlorhexidine  15 mL Mouth Rinse BID  . digoxin  0.125 mg Oral Daily  . enoxaparin (LOVENOX) injection  30 mg Subcutaneous Q24H  . febuxostat  40 mg Oral Daily  . gabapentin  200 mg Oral BID  . guaiFENesin  600 mg Oral BID  . levothyroxine  150 mcg Oral QAC breakfast  . magnesium oxide  400 mg Oral Daily  . predniSONE  20 mg Oral Q breakfast  . senna-docusate  1 tablet Oral QHS  . sodium chloride  10-20 mL Intravenous Q12H  . sodium chloride  3 mL Intravenous Q12H  . tobramycin-dexamethasone   Right Eye TID     BMET    Component Value Date/Time   NA 140 09/18/2013 0500   K 4.0 09/18/2013 0500   CL 101 09/18/2013 0500   CO2 25 09/18/2013 0500   GLUCOSE 94 09/18/2013 0500   BUN 35* 09/18/2013 0500   CREATININE 1.35 09/18/2013 0500   CREATININE 1.76* 06/28/2012 1228   CALCIUM 8.7 09/18/2013 0500   GFRNONAA 58* 09/18/2013 0500   GFRAA 67* 09/18/2013 0500   CBC    Component Value Date/Time   WBC 15.6* 09/18/2013 0500   WBC 31.4* 06/28/2012 1259   RBC 4.32 09/18/2013 0500   RBC 5.74 06/28/2012 1259   HGB 12.4* 09/18/2013 0500   HGB 16.8 06/28/2012 1259   HCT 36.4* 09/18/2013 0500   HCT 51.4 06/28/2012 1259   PLT 277 09/18/2013 0500   MCV 84.3 09/18/2013 0500   MCV 89.6 06/28/2012 1259   MCH 28.7 09/18/2013 0500   MCH 29.3 06/28/2012 1259   MCHC 34.1 09/18/2013 0500   MCHC 32.7 06/28/2012 1259   RDW 13.9 09/18/2013 0500   LYMPHSABS 2.1 06/29/2012 0540   MONOABS 2.7* 06/29/2012 0540   EOSABS 0.0 06/29/2012 0540   BASOSABS 0.0 06/29/2012 0540     Assessment/Plan:  1. Acute on chronic kidney injury- Resolved. S/p Ct scan with contrast yesterday, Cr this morning- 1.35, will need to wait up to 48 hrs to ensure no injury caused by contrast  Urine out put yesterday- 1100.   2. Leukocytosis- 15.6 today. Worsened initaily with steroids started for gout flare. Without fever or focus of infection.   3. Hx of HTN, stable, on milrinone.   4. 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 Ct scan with contrast- Mass indeterminate, recs- Ct guided biopsy.  5. CHF- EF- 15%- Management per cards.   6. Hypomagnesemia- repleted. On daily suplimentation of Mag at home- continued on admission.   7. Gout Flare-  Home meds febuxostat and colchicine, continued on admission. On prednisone- 20mg  daily.  Kirkland Hun, PGY- 2

## 2013-09-22 ENCOUNTER — Other Ambulatory Visit (HOSPITAL_COMMUNITY): Payer: Self-pay | Admitting: Internal Medicine

## 2013-10-01 NOTE — Discharge Summary (Signed)
Medical record reviewed, agree with above note and plan  Tharon Aquas Trigt M.D.

## 2013-10-02 ENCOUNTER — Ambulatory Visit
Admission: RE | Admit: 2013-10-02 | Discharge: 2013-10-02 | Disposition: A | Discharge status: Home / Self Care | Payer: 59 | Source: Ambulatory Visit | Attending: Cardiothoracic Surgery | Admitting: Cardiothoracic Surgery

## 2013-10-02 ENCOUNTER — Encounter: Payer: Self-pay | Admitting: Cardiothoracic Surgery

## 2013-10-02 ENCOUNTER — Ambulatory Visit (INDEPENDENT_AMBULATORY_CARE_PROVIDER_SITE_OTHER): Payer: Self-pay | Admitting: Cardiothoracic Surgery

## 2013-10-02 VITALS — BP 137/86 | HR 96 | Ht 70.0 in | Wt 256.0 lb

## 2013-10-02 DIAGNOSIS — R222 Localized swelling, mass and lump, trunk: Secondary | ICD-10-CM

## 2013-10-02 DIAGNOSIS — J9859 Other diseases of mediastinum, not elsewhere classified: Secondary | ICD-10-CM

## 2013-10-02 NOTE — Progress Notes (Signed)
PCP is Gennette Pac, MD Referring Provider is Collene Gobble, MD  Chief Complaint  Patient presents with  . 2 WK /F/U    CARDIAC/GDC CXR has been performed    HPI: The patient returns for routine postop followup after right [place] procedure for biopsy of a right mid mediastinal mass. At the time of surgery the mass was quite vascular and and attempted wedge biopsy resulted in severe bleeding. Needle aspirate of the mass more posteriorly resulted in bleeding as well. Both sites were surgically closed. The mediastinal tissue on the surface of the mass was sent for pathology which was unremarkable. A postoperative CT scan with contrast showed no clear feeding blood vessel to suggest this was a hemangioma or AV malformation. Despite having nonischemic cardiomyopathy with ejection fraction of 10-15% the patient did well postoperatively. He did develop gout in his right knee which required a Depo-Medrol injection by his rheumatologist.  Patient is doing well now and is back to work. His right knee pain is resolving. His post thoracotomy pain is improved and he is not requiring narcotics. He feels weak. No increased symptoms of heart care. The incisions are healing well. Chest x-ray today shows persistence of the right mediastinal mass without pleural effusion or pneumothorax.  At this point it appears the patient will require a formal postero-lateral thoracotomy in order to remove this mass after he recovers from the [place] procedure and after he recovers from his gouty arthritis. Past Medical History  Diagnosis Date  . Gout 08/03/12    PT C/O OF RIGHT KNEE PAIN AND SWELLING -STATES GOUT FLARE UP - ONGOING SINCE APRIL - BUT SWELLING W/IN LAST WEEK  . Cardiomyopathy     Idiopathic dilated;   Marland Kitchen Ventricular tachycardia     s/p ICD  . Sleep apnea     CPAP  . Systolic CHF     EF 76%  . CKD (chronic kidney disease)     stage III baseline Crt 1.8-2.0  . Biventricular ICD (implantable  cardiac defibrillator) Medtronic ]     DOI 2008/ upgrade 2010/ Gen Change 2014  . Automatic implantable cardioverter-defibrillator in situ   . CHF (congestive heart failure)   . Bladder tumor     PT HOSP AT [facility] 4/24 TO 4/26 2014 WITH UTI--AND FOUND TO HAVE BLADDER TUMOR-  . Arthritis     GOUT  . Hilar mass     Noted CT 2010  . HTN (hypertension)     Dr. Caryl Comes cardiologist  . Coronary artery disease   . Hypothyroidism     Past Surgical History  Procedure Laterality Date  . Cholecystectomy    . Cardiac catheterization      he was found to have normal coronary arteries but with a globally dilated and hypocontractile heart  . US echocardiography  03-17-2008, 07-03-2006    EF 15-20%, EF 15-20%  . Transurethral resection of bladder tumor N/A 08/13/2012    Procedure: TRANSURETHRAL RESECTION OF BLADDER TUMOR (TURBT);  Surgeon: Claybon Jabs, MD;  Location: [facility];  Service: Urology;  Laterality: N/A;  MITOMYCIN C  . Icd insertion  04/2012-new ICD  . Endobronchial ultrasound Bilateral 10/01/2012    Procedure: ENDOBRONCHIAL ULTRASOUND;  Surgeon: Collene Gobble, MD;  Location: [facility];  Service: Cardiopulmonary;  Laterality: Bilateral;  . Cornea replacement    . Video bronchoscopy N/A 10/25/2012    Procedure: VIDEO BRONCHOSCOPY;  Surgeon: [name], MD;  Location: [place];  Service: Thoracic;  Laterality: N/A;  .  Mediastinoscopy N/A 10/25/2012    Procedure: MEDIASTINOSCOPY;  Surgeon: [name], MD;  Location: [place];  Service: Thoracic;  Laterality: N/A;  . Tonsillectomy    . Mediastinotomy  09/10/2013    paratracheal mass    DR VANTRIGT  . Chest tube insertion  09/10/2013  . Mediastinotomy chamberlain mcneil Right 09/10/2013    Procedure: MEDIASTINOTOMY CHAMBERLAIN MCNEIL;  Surgeon: [name], MD;  Location: [place];  Service: Thoracic;  Laterality: Right;    No family history on file.  Social History History  Substance Use Topics  . Smoking status: Never Smoker    . Smokeless tobacco: Never Used  . Alcohol Use: No    Current Outpatient Prescriptions  Medication Sig Dispense Refill  . atorvastatin (LIPITOR) 40 MG tablet Take 40 mg by mouth at bedtime.       . carvedilol (COREG) 25 MG tablet Take 1 tablet (25 mg total) by mouth 2 (two) times daily with a meal.  60 tablet  6  . colchicine 0.6 MG tablet Take 0.6 mg by mouth daily as needed (gout).      Marland Kitchen digoxin (LANOXIN) 0.25 MG tablet TAKE 0.5 TABLETS (0.125 MG TOTAL) BY MOUTH DAILY.  15 tablet  6  . febuxostat (ULORIC) 40 MG tablet Take 80 mg by mouth daily.       . furosemide (LASIX) 40 MG tablet Take 1 tablet (40 mg total) by mouth every other day.  17 tablet  6  . levothyroxine (SYNTHROID, LEVOTHROID) 150 MCG tablet Take 150 mcg by mouth daily before breakfast.      . losartan (COZAAR) 25 MG tablet Take 1 tablet (25 mg total) by mouth daily.  30 tablet  3  . magnesium oxide (MAG-OX) 400 MG tablet Take 400 mg by mouth daily.      Marland Kitchen OVER THE COUNTER MEDICATION Place 1 drop into both eyes daily as needed (red eyes). Eye drops      . predniSONE (DELTASONE) 10 MG tablet Take 1 tablet (10 mg total) by mouth daily with breakfast. For 2 Days, then discontinue medication  2 tablet  0   No current facility-administered medications for this visit.    Allergies  Allergen Reactions  . Bidil [Isosorb Dinitrate-Hydralazine]     Headaches   . Hydralazine Hcl     Fuzzy headed  . Spironolactone     Hyperkalemia     Review of Systems slowly improving  BP 137/86  Pulse 96  Ht 5\' 10"  (1.778 m)  Wt 256 lb (116.121 kg)  BMI 36.73 kg/m2  SpO2 98% Physical Exam Alert and comfortable Breath sounds clear Right parasternal incision dry and clean Heart rhythm regular  Diagnostic Tests: Chest x-ray with persistent right mediastinal mass, no pleural effusion or pneumothorax  Impression: Recovering from [place] procedure which unfortunately did not produce a diagnostic tissue sample because of  severe bleeding. Patient will need formal thoracotomy for resection later after he recovers.  Plan: Return in about 4 weeks to assess his clinical status, the status of his gouty arthritis, and his recovery from his right [place] procedure-mediastinotomy

## 2013-10-09 ENCOUNTER — Encounter (HOSPITAL_COMMUNITY): Payer: Self-pay

## 2013-10-09 ENCOUNTER — Ambulatory Visit (HOSPITAL_COMMUNITY)
Admission: RE | Admit: 2013-10-09 | Discharge: 2013-10-09 | Disposition: A | Discharge status: Home / Self Care | Payer: 59 | Source: Ambulatory Visit | Attending: Internal Medicine | Admitting: Internal Medicine

## 2013-10-09 VITALS — BP 102/76 | HR 94 | Wt 246.4 lb

## 2013-10-09 DIAGNOSIS — G4733 Obstructive sleep apnea (adult) (pediatric): Secondary | ICD-10-CM | POA: Diagnosis not present

## 2013-10-09 DIAGNOSIS — I509 Heart failure, unspecified: Secondary | ICD-10-CM | POA: Insufficient documentation

## 2013-10-09 DIAGNOSIS — I428 Other cardiomyopathies: Secondary | ICD-10-CM | POA: Diagnosis not present

## 2013-10-09 DIAGNOSIS — N183 Chronic kidney disease, stage 3 unspecified: Secondary | ICD-10-CM | POA: Insufficient documentation

## 2013-10-09 DIAGNOSIS — R222 Localized swelling, mass and lump, trunk: Secondary | ICD-10-CM | POA: Insufficient documentation

## 2013-10-09 DIAGNOSIS — C679 Malignant neoplasm of bladder, unspecified: Secondary | ICD-10-CM | POA: Insufficient documentation

## 2013-10-09 DIAGNOSIS — I4729 Other ventricular tachycardia: Secondary | ICD-10-CM | POA: Diagnosis not present

## 2013-10-09 DIAGNOSIS — I2510 Atherosclerotic heart disease of native coronary artery without angina pectoris: Secondary | ICD-10-CM | POA: Diagnosis not present

## 2013-10-09 DIAGNOSIS — M109 Gout, unspecified: Secondary | ICD-10-CM | POA: Diagnosis not present

## 2013-10-09 DIAGNOSIS — M25559 Pain in unspecified hip: Secondary | ICD-10-CM | POA: Insufficient documentation

## 2013-10-09 DIAGNOSIS — I472 Ventricular tachycardia, unspecified: Secondary | ICD-10-CM | POA: Insufficient documentation

## 2013-10-09 DIAGNOSIS — E669 Obesity, unspecified: Secondary | ICD-10-CM | POA: Insufficient documentation

## 2013-10-09 DIAGNOSIS — E039 Hypothyroidism, unspecified: Secondary | ICD-10-CM | POA: Diagnosis not present

## 2013-10-09 DIAGNOSIS — I5022 Chronic systolic (congestive) heart failure: Secondary | ICD-10-CM | POA: Diagnosis not present

## 2013-10-09 DIAGNOSIS — I129 Hypertensive chronic kidney disease with stage 1 through stage 4 chronic kidney disease, or unspecified chronic kidney disease: Secondary | ICD-10-CM | POA: Diagnosis not present

## 2013-10-09 DIAGNOSIS — Z95810 Presence of automatic (implantable) cardiac defibrillator: Secondary | ICD-10-CM | POA: Insufficient documentation

## 2013-10-09 DIAGNOSIS — J9859 Other diseases of mediastinum, not elsewhere classified: Secondary | ICD-10-CM

## 2013-10-09 NOTE — Patient Instructions (Signed)
Labs today.  Your physician recommends that you schedule a follow-up appointment in: 6 weeks with MD  Do the following things EVERYDAY: 1) Weigh yourself in the morning before breakfast. Write it down and keep it in a log. 2) Take your medicines as prescribed 3) Eat low salt foods-Limit salt (sodium) to 2000 mg per day.  4) Stay as active as you can everyday 5) Limit all fluids for the day to less than 2 liters 6)

## 2013-10-09 NOTE — Progress Notes (Signed)
Patient ID: [name], male   DOB: 06/21/1960, 53 y.o.   MRN: [id number]  PCP: Dr Rex Kras  Nephrologist: Dr Marval Regal  Cardiologist: Dr Caryl Comes Urologist: Dr Karsten Ro  HPI: [name] is a 53 year old with a PMH of obesity, gout, CHF due to nonischemic cardiomyopathy S/P Medtronic CRT-D implantation 2010, VT (intoelrant of amiodarone d/t lightheadedness.), hypothyroidism, bladder cancer and OSA (CPAP) and chest mass.   He is intolerant to numerous HF meds including spiro (severe hyperkalemia - requiring HD), hydralazine (fuzzy-headed) and imdur (severe HAs). S/P 12/17/12 bladder cancer removal.   RHC 01/28/12  RA = 1  RV = 28/2/4  PA = 32/17 (22)  PCW = 6  Fick cardiac output/index = 5.1/2.2  Thermo cardiac output/index = 5.0/2.1  PVR = 4.0 [place]  FA sat = 98%  PA sat = 64%, 68%   11/29/11 ECHO EF 15% Diffuse hypokinesis. Features are consistent with a pseudonormal left ventricular filling pattern, with concomitant abnormal relaxation and increased filling pressure (grade 2 diastolic dysfunction).   Admitted to [facility] 01/26/2012 after his ICD fired 5 times Potassium >7.5 Required urgent dialysis. Discharged form [facility] 01/29/12. Discharge weight 250 pounds.   CPX test 12/13  Resting HR: 90 Peak HR: 150 % age predicted max HR: 89% BP rest: 121/88 BP peak: 189/112 Peak VO2: 15.2 % predicted peak VO2: 58.3% (When adjusted to the patient's ideal body weight of 176 lb (79.8 kg) the peak VO2 is 22 ml/kg (ibw)/min (63% of the ibw-adjusted predicted) VE/VCO2 slope: 26.4 OUES: 2.29 Peak RER: 1.05 Ventilatory Threshold: 12.3 % predicted peak VO2: 47.2%  CPX 07/12/13 FVC 1.55 (37%)  FEV1 1.19 (36%)  FEV1/FVC 77%  MVV 53 (35%) Exercise Time: 3:30 Speed (mph): 1.5 Grade (%): 0  RPE: 15 Resting HR: 109 Peak HR: 141 (84% age predicted max HR) BP rest: 130/90 BP peak: 142/96 Peak VO2: 9.0 (34.7% predicted peak VO2)  VE/VCO2 slope: 32.0 OUES: 1.90 Peak RER: 0.90 Ventilatory Threshold: Could  not be detected VE/MVV: 58.5% PETCO2 at peak: 33 O2pulse: 8 (44% predicted O2pulse)  ECHO 02/25/13 EF <15%   Follow up: Admitted in July for [facility] procedure for biopsy of a right mid mediastinal mass. At the time of surgery the mass was quite vascular and and attempted wedge biopsy resulted in severe bleeding. Needle aspirate of the mass more posteriorly resulted in bleeding as well. Both sites were surgically closed. The mediastinal tissue on the surface of the mass was sent for pathology which was unremarkable. A postoperative CT scan with contrast showed no clear feeding blood vessel to suggest this was a hemangioma or AV malformation. While in hospital treated with milrinone and renal function improved.   Post-op He did develop gout in his right knee which required a Depo-Medrol injection by his rheumatologist and uloric increased.   Doing well. Still doing BCG treatment for bladder. Denies SOB, PND, orthopnea or CP. Compliant with CPAP. Weight now down to 242 (was 250). +Fatigue. Urinating well off lasix.   Optivol back down to baseline. Activity level 2hrs/day. No AF/VT  Labs  12/12/12 dig level 0.9 12/18/12 Magnesium 1.1 K 4.0 Creatinine 1.5 01/02/13: Uric acid 12.9, K+ 3.9, Cr 1.37, AST 21, ALT 19, Mag 1.3 01/29/13 K 4.1 Creatinine 1.5 Magnesium 1.5 03/21/13 Magnesium 1.7 TSH 1.11--> synthorid increased to 100 mcg.   ROS: All systems negative except as listed in HPI, PMH and Problem List.  Past Medical History  Diagnosis Date  . Gout 08/03/12  PT C/O OF RIGHT KNEE PAIN AND SWELLING -STATES GOUT FLARE UP - ONGOING SINCE APRIL - BUT SWELLING W/IN LAST WEEK  . Cardiomyopathy     Idiopathic dilated;   Marland Kitchen Ventricular tachycardia     s/p ICD  . Sleep apnea     CPAP  . Systolic CHF     EF 33%  . CKD (chronic kidney disease)     stage III baseline Crt 1.8-2.0  . Biventricular ICD (implantable cardiac defibrillator) Medtronic ]     DOI 2008/ upgrade 2010/ Gen Change 2014  .  Automatic implantable cardioverter-defibrillator in situ   . CHF (congestive heart failure)   . Bladder tumor     PT HOSP AT [facility] 4/24 TO 4/26 2014 WITH UTI--AND FOUND TO HAVE BLADDER TUMOR-  . Arthritis     GOUT  . Hilar mass     Noted CT 2010  . HTN (hypertension)     Dr. Caryl Comes cardiologist  . Coronary artery disease   . Hypothyroidism     Current Outpatient Prescriptions  Medication Sig Dispense Refill  . atorvastatin (LIPITOR) 40 MG tablet Take 40 mg by mouth at bedtime.       . carvedilol (COREG) 25 MG tablet Take 1 tablet (25 mg total) by mouth 2 (two) times daily with a meal.  60 tablet  6  . colchicine 0.6 MG tablet Take 0.6 mg by mouth daily as needed (gout).      Marland Kitchen digoxin (LANOXIN) 0.25 MG tablet TAKE 0.5 TABLETS (0.125 MG TOTAL) BY MOUTH DAILY.  15 tablet  6  . febuxostat (ULORIC) 40 MG tablet Take 80 mg by mouth daily.       . furosemide (LASIX) 40 MG tablet Take 1 tablet (40 mg total) by mouth every other day.  17 tablet  6  . levothyroxine (SYNTHROID, LEVOTHROID) 150 MCG tablet Take 150 mcg by mouth daily before breakfast.      . losartan (COZAAR) 25 MG tablet Take 1 tablet (25 mg total) by mouth daily.  30 tablet  3  . magnesium oxide (MAG-OX) 400 MG tablet Take 400 mg by mouth daily.      Marland Kitchen OVER THE COUNTER MEDICATION Place 1 drop into both eyes daily as needed (red eyes). Eye drops      . predniSONE (DELTASONE) 10 MG tablet Take 1 tablet (10 mg total) by mouth daily with breakfast. For 2 Days, then discontinue medication  2 tablet  0   No current facility-administered medications for this encounter.     Filed Vitals:   10/09/13 1401  BP: 102/76  Pulse: 94  Weight: 246 lb 6.4 oz (111.766 kg)  SpO2: 98%    PHYSICAL EXAM: General:  Well appearing. No resp difficulty HEENT: normal Neck: supple. JVP difficult to assess but appears flat. Carotids 2+ bilaterally; no bruits. No lymphadenopathy or thryomegaly appreciated. R neck scar  Cor: PMI normal. Regular rate  & rhythm. No rubs, gallops or murmurs. Lungs: clear Abdomen: obese, soft, nontender, nondistended. No hepatosplenomegaly. No bruits or masses. Good bowel sounds. Extremities: no cyanosis, clubbing, rash, edema Neuro: alert & orientedx3, cranial nerves grossly intact. Moves all 4 extremities w/o difficulty. Affect pleasant.   ASSESSMENT & PLAN:  1) Chronic systolic HF: NICM, EF 35% (02/2013) s/p Medtronic Optivol  ICD 04/2012 .  - NYHA II symptoms and volume status stable resume lasix 40 mg QOD.  - On goal dose coreg 25 mg BID. - Continue digoxin 0.125 mg daily and benicar  20 mg daily. Check BMET -He is intoelrant to numerous HF meds including spiro (severe hyperkalemia - requiring HD), hydralazine (fuzzy-headed) and imdur (severe HAs). - Overall he has been stable from HF standpoint thugh his renal function did improve significantly with milrinone while in hospital and I suspect his cardiac output is marginal His EF has been 10-15% since at least 2008 and discussed with him concern that he may need advanced therapies at some point in near future. Blood Type A, however is being treated for bladder cancer currently and would have to be in remission for at least 5 years to consider heart tx. He could possibly need LVAD in the not so distant future. CPX 5/15 concerning but RER was submaximal. If getting worse will need RHC.  - Reinforced the need and importance of daily weights, a low sodium diet, and fluid restriction (less than 2 L a day). Instructed to call the HF clinic if weight increases more than 3 lbs overnight or 5 lbs in a week.  2) Mediastinal mass. Dr. Darcey Nora planning removal with posterolateral thoracotomy in next several months 3) OSA -Continue CPAP nightly 4) Bladder Cancer - Continuing with BCG treatment 5) VT - quiescent. Followed by Dr. Caryl Comes 6) Hypomagnesemia - Will follow 7) Hip pain  Benay Spice 2:18 PM

## 2013-10-10 ENCOUNTER — Other Ambulatory Visit (HOSPITAL_COMMUNITY): Payer: Self-pay | Admitting: Internal Medicine

## 2013-10-15 ENCOUNTER — Encounter: Payer: Self-pay | Admitting: *Deleted

## 2013-10-16 LAB — DIGOXIN LEVEL: DIGOXIN LVL: 0.5 ng/mL — AB (ref 0.9–2.0)

## 2013-10-16 LAB — SPECIMEN STATUS REPORT

## 2013-10-25 ENCOUNTER — Other Ambulatory Visit: Payer: Self-pay | Admitting: Cardiothoracic Surgery

## 2013-10-25 DIAGNOSIS — R222 Localized swelling, mass and lump, trunk: Secondary | ICD-10-CM

## 2013-10-29 ENCOUNTER — Encounter: Payer: Self-pay | Admitting: Internal Medicine

## 2013-10-30 ENCOUNTER — Ambulatory Visit
Admission: RE | Admit: 2013-10-30 | Discharge: 2013-10-30 | Disposition: A | Discharge status: Home / Self Care | Payer: 59 | Source: Ambulatory Visit | Attending: Cardiothoracic Surgery | Admitting: Cardiothoracic Surgery

## 2013-10-30 ENCOUNTER — Encounter: Payer: Self-pay | Admitting: Cardiothoracic Surgery

## 2013-10-30 ENCOUNTER — Ambulatory Visit (INDEPENDENT_AMBULATORY_CARE_PROVIDER_SITE_OTHER): Payer: Self-pay | Admitting: Cardiothoracic Surgery

## 2013-10-30 VITALS — BP 133/90 | HR 93 | Resp 16 | Ht 70.0 in | Wt 242.0 lb

## 2013-10-30 DIAGNOSIS — Z9889 Other specified postprocedural states: Secondary | ICD-10-CM

## 2013-10-30 DIAGNOSIS — J9859 Other diseases of mediastinum, not elsewhere classified: Secondary | ICD-10-CM

## 2013-10-30 DIAGNOSIS — R222 Localized swelling, mass and lump, trunk: Secondary | ICD-10-CM

## 2013-10-30 DIAGNOSIS — Z09 Encounter for follow-up examination after completed treatment for conditions other than malignant neoplasm: Secondary | ICD-10-CM

## 2013-10-30 NOTE — Progress Notes (Signed)
PCP is Gennette Pac, MD Referring Provider is Collene Gobble, MD  Chief Complaint  Patient presents with  . Routine Post Op    4 wk f/u with cxr for reassessment    HPI: Patient returns for one month followup after right anterior mediastinotomy-Chamberlain procedure for biopsy of mid mediastinal mass. Because of bleeding encountered during the procedure non- adequate tissue was removed. A surgical biopsy was attempted to obtain significant tissue because of suspicion of lymphoma. The patient has recovered fairly well but still is not walking much because of a gout flare up postop. He was recently evaluated by his cardiologist in the heart failure clinic- the patient has nonischemic cardiomyopathy with EF 15%.   the patient's symptoms of tracheal compression and esophageal compression are stable and minimal. He has lost 8 pounds but still is obese with BMI greater than 30  Surgical resection would require a fairly large right posterior lateral thoracotomy approach which will be at significant risk with his cardiomyopathy.  Because the primary diagnosis is still uncertain I am recommending a CT directed core needle biopsy from a posterior approach which would avoid the overlying vascular structures encountered from the anterior approach. The patient return for session of the biopsy results. The patient would need to wait another 6-8 weeks to recover from his previous minithoracotomy before scheduling resection of this 8 cm mediastinal mass.   Past Medical History  Diagnosis Date  . Gout 08/03/12    PT C/O OF RIGHT KNEE PAIN AND SWELLING -STATES GOUT FLARE UP - ONGOING SINCE APRIL - BUT SWELLING W/IN LAST WEEK  . Cardiomyopathy     Idiopathic dilated;   Marland Kitchen Ventricular tachycardia     s/p ICD  . Sleep apnea     CPAP  . Systolic CHF     EF 09%  . CKD (chronic kidney disease)     stage III baseline Crt 1.8-2.0  . Biventricular ICD (implantable cardiac defibrillator) Medtronic ]      DOI 2008/ upgrade 2010/ Gen Change 2014  . Automatic implantable cardioverter-defibrillator in situ   . CHF (congestive heart failure)   . Bladder tumor     PT HOSP AT [facility] 4/24 TO 4/26 2014 WITH UTI--AND FOUND TO HAVE BLADDER TUMOR-  . Arthritis     GOUT  . Hilar mass     Noted CT 2010  . HTN (hypertension)     Dr. Caryl Comes cardiologist  . Coronary artery disease   . Hypothyroidism     Past Surgical History  Procedure Laterality Date  . Cholecystectomy    . Cardiac catheterization      he was found to have normal coronary arteries but with a globally dilated and hypocontractile heart  . US echocardiography  03-17-2008, 07-03-2006    EF 15-20%, EF 15-20%  . Transurethral resection of bladder tumor N/A 08/13/2012    Procedure: TRANSURETHRAL RESECTION OF BLADDER TUMOR (TURBT);  Surgeon: Claybon Jabs, MD;  Location: [facility];  Service: Urology;  Laterality: N/A;  MITOMYCIN C  . Icd insertion  04/2012-new ICD  . Endobronchial ultrasound Bilateral 10/01/2012    Procedure: ENDOBRONCHIAL ULTRASOUND;  Surgeon: Collene Gobble, MD;  Location: [facility];  Service: Cardiopulmonary;  Laterality: Bilateral;  . Cornea replacement    . Video bronchoscopy N/A 10/25/2012    Procedure: VIDEO BRONCHOSCOPY;  Surgeon: [name], MD;  Location: [place];  Service: Thoracic;  Laterality: N/A;  . Mediastinoscopy N/A 10/25/2012    Procedure: MEDIASTINOSCOPY;  Surgeon:  [name], MD;  Location: [facility];  Service: Thoracic;  Laterality: N/A;  . Tonsillectomy    . Mediastinotomy  09/10/2013    paratracheal mass    DR Eloni Darius  . Chest tube insertion  09/10/2013  . Mediastinotomy chamberlain mcneil Right 09/10/2013    Procedure: MEDIASTINOTOMY CHAMBERLAIN MCNEIL;  Surgeon: [name], MD;  Location: [place];  Service: Thoracic;  Laterality: Right;    No family history on file.  Social History History  Substance Use Topics  . Smoking status: Never Smoker   . Smokeless tobacco: Never Used  .  Alcohol Use: No    Current Outpatient Prescriptions  Medication Sig Dispense Refill  . atorvastatin (LIPITOR) 40 MG tablet Take 40 mg by mouth at bedtime.       . carvedilol (COREG) 25 MG tablet Take 1 tablet (25 mg total) by mouth 2 (two) times daily with a meal.  60 tablet  6  . colchicine 0.6 MG tablet Take 0.6 mg by mouth daily as needed (gout).      Marland Kitchen digoxin (LANOXIN) 0.25 MG tablet TAKE 0.5 TABLETS (0.125 MG TOTAL) BY MOUTH DAILY.  15 tablet  6  . febuxostat (ULORIC) 40 MG tablet Take 80 mg by mouth daily.       . furosemide (LASIX) 40 MG tablet Take 1 tablet (40 mg total) by mouth every other day.  17 tablet  6  . levothyroxine (SYNTHROID, LEVOTHROID) 150 MCG tablet Take 150 mcg by mouth daily before breakfast.      . losartan (COZAAR) 25 MG tablet Take 1 tablet (25 mg total) by mouth daily.  30 tablet  3  . magnesium oxide (MAG-OX) 400 MG tablet Take 400 mg by mouth daily.      Marland Kitchen OVER THE COUNTER MEDICATION Place 1 drop into both eyes daily as needed (red eyes). Eye drops       No current facility-administered medications for this visit.    Allergies  Allergen Reactions  . Bidil [Isosorb Dinitrate-Hydralazine]     Headaches   . Hydralazine Hcl     Fuzzy headed  . Spironolactone     Hyperkalemia     Review of SystemsPatient back to work   is not taking any blood thinners  BP 133/90  Pulse 93  Resp 16  Ht 5\' 10"  (1.778 m)  Wt 242 lb (109.77 kg)  BMI 34.72 kg/m2  SpO2 98% Physical Exam Alert and comfortable  Breath sounds clear and equal  Anterior thoracotomy incision well-healed  Heart rhythm regular   Diagnostic Tests: Chest x-ray clear with no change in the mid mediastinal mass   Impression: We'll proceed with CT directed needle biopsy from a posterior approach to establish diagnosis.   Surgical resection via right posterior lateral thoracotomy would be performed later if indicated and after patient has recovered fully from his operation last  month.  Plan  Return for results of biopsy and review of situation in 3-4 weeks

## 2013-10-31 ENCOUNTER — Other Ambulatory Visit: Payer: Self-pay

## 2013-10-31 DIAGNOSIS — J9859 Other diseases of mediastinum, not elsewhere classified: Secondary | ICD-10-CM

## 2013-11-01 ENCOUNTER — Other Ambulatory Visit: Payer: Self-pay | Admitting: Radiology

## 2013-11-04 ENCOUNTER — Encounter (HOSPITAL_COMMUNITY): Payer: Self-pay | Admitting: Pharmacy Technician

## 2013-11-04 ENCOUNTER — Other Ambulatory Visit: Payer: Self-pay | Admitting: Radiology

## 2013-11-05 ENCOUNTER — Encounter (HOSPITAL_COMMUNITY): Payer: Self-pay

## 2013-11-05 ENCOUNTER — Ambulatory Visit (HOSPITAL_COMMUNITY)
Admission: RE | Admit: 2013-11-05 | Discharge: 2013-11-05 | Disposition: A | Discharge status: Home / Self Care | Payer: 59 | Source: Ambulatory Visit | Attending: Interventional Radiology | Admitting: Interventional Radiology

## 2013-11-05 ENCOUNTER — Ambulatory Visit (HOSPITAL_COMMUNITY)
Admission: RE | Admit: 2013-11-05 | Discharge: 2013-11-05 | Disposition: A | Discharge status: Home / Self Care | Payer: 59 | Source: Ambulatory Visit | Attending: Cardiothoracic Surgery | Admitting: Cardiothoracic Surgery

## 2013-11-05 DIAGNOSIS — R222 Localized swelling, mass and lump, trunk: Secondary | ICD-10-CM | POA: Diagnosis present

## 2013-11-05 DIAGNOSIS — C679 Malignant neoplasm of bladder, unspecified: Secondary | ICD-10-CM | POA: Insufficient documentation

## 2013-11-05 DIAGNOSIS — J9859 Other diseases of mediastinum, not elsewhere classified: Secondary | ICD-10-CM

## 2013-11-05 LAB — PROTIME-INR
INR: 1.15 (ref 0.00–1.49)
Prothrombin Time: 14.7 seconds (ref 11.6–15.2)

## 2013-11-05 LAB — APTT: aPTT: 37 seconds (ref 24–37)

## 2013-11-05 LAB — CBC
HEMATOCRIT: 47.6 % (ref 39.0–52.0)
HEMOGLOBIN: 16.3 g/dL (ref 13.0–17.0)
MCH: 29.7 pg (ref 26.0–34.0)
MCHC: 34.2 g/dL (ref 30.0–36.0)
MCV: 86.7 fL (ref 78.0–100.0)
Platelets: 209 10*3/uL (ref 150–400)
RBC: 5.49 MIL/uL (ref 4.22–5.81)
RDW: 14.8 % (ref 11.5–15.5)
WBC: 9 10*3/uL (ref 4.0–10.5)

## 2013-11-05 MED ORDER — MIDAZOLAM HCL 2 MG/2ML IJ SOLN
INTRAMUSCULAR | Status: AC | PRN
  Administered 2013-11-05: 0.5 mg via INTRAVENOUS
  Administered 2013-11-05: 1 mg via INTRAVENOUS

## 2013-11-05 MED ORDER — FENTANYL CITRATE 0.05 MG/ML IJ SOLN
INTRAMUSCULAR | Status: AC | PRN
  Administered 2013-11-05 (×2): 25 ug via INTRAVENOUS

## 2013-11-05 MED ORDER — GELATIN ABSORBABLE 12-7 MM EX MISC
CUTANEOUS | Status: AC
  Filled 2013-11-05: qty 1

## 2013-11-05 MED ORDER — SODIUM CHLORIDE 0.9 % IV SOLN
INTRAVENOUS | Status: DC
  Administered 2013-11-05: 08:00:00 via INTRAVENOUS

## 2013-11-05 MED ORDER — MIDAZOLAM HCL 2 MG/2ML IJ SOLN
INTRAMUSCULAR | Status: AC
  Filled 2013-11-05: qty 4

## 2013-11-05 MED ORDER — FENTANYL CITRATE 0.05 MG/ML IJ SOLN
INTRAMUSCULAR | Status: AC
  Filled 2013-11-05: qty 4

## 2013-11-05 MED ORDER — LIDOCAINE-EPINEPHRINE 1 %-1:100000 IJ SOLN
INTRAMUSCULAR | Status: AC
  Filled 2013-11-05: qty 1

## 2013-11-05 NOTE — Procedures (Signed)
Technically successful CT guided biopsy of right sided middle mediastinal mass  No immediate post procedural complications.

## 2013-11-05 NOTE — Discharge Instructions (Signed)
Biopsy Care After These instructions give you information on caring for yourself after your procedure. Your doctor may also give you more specific instructions. Call your doctor if you have any problems or questions after your procedure. HOME CARE   Return to your normal diet and activities as told by your doctor.  Change your bandages (dressings) as told by your doctor. If skin glue (adhesive) was used, it will peel off in 7 days.  Only take medicines as told by your doctor.  Ask your doctor when you can bathe and get your wound wet. GET HELP RIGHT AWAY IF:  You see more than a small spot of blood coming from the wound.  You have redness, puffiness (swelling), or pain.  You see yellowish-white fluid (pus) coming from the wound.  You have a fever.  You notice a bad smell coming from the wound or bandage.  You have a rash, trouble breathing, or any allergy problems. MAKE SURE YOU:   Understand these instructions.  Will watch your condition.  Will get help right away if you are not doing well or get worse. Document Released: 10/26/2010 Document Revised: 05/16/2011 Document Reviewed: 10/26/2010 ExitCare Patient Information 2015 [employer]. This information is not intended to replace advice given to you by your health care provider. Make sure you discuss any questions you have with your health care provider.  

## 2013-11-05 NOTE — H&P (Signed)
[name] is an 53 y.o. male.   Chief Complaint: pt with known R mediastinal mass Has been seen and attempted mediatsinotomy with Dr [name] 09/2012 Procedure was aborted secondary bleeding issues Now scheduled for needle biopsy Worrisome for lymphoma  HPI: gout; cardiomyopathy/ventric tachy- defibrillator implant; CHF; bladder tumor; HTN; CAD; sleep apnea; hypothyroid  Past Medical History  Diagnosis Date  . Gout 08/03/12    PT C/O OF RIGHT KNEE PAIN AND SWELLING -STATES GOUT FLARE UP - ONGOING SINCE APRIL - BUT SWELLING W/IN LAST WEEK  . Cardiomyopathy     Idiopathic dilated;   Marland Kitchen Ventricular tachycardia     s/p ICD  . Sleep apnea     CPAP  . Systolic CHF     EF 47%  . CKD (chronic kidney disease)     stage III baseline Crt 1.8-2.0  . Biventricular ICD (implantable cardiac defibrillator) Medtronic ]     DOI 2008/ upgrade 2010/ Gen Change 2014  . Automatic implantable cardioverter-defibrillator in situ   . CHF (congestive heart failure)   . Bladder tumor     PT HOSP AT [facility] 4/24 TO 4/26 2014 WITH UTI--AND FOUND TO HAVE BLADDER TUMOR-  . Arthritis     GOUT  . Hilar mass     Noted CT 2010  . HTN (hypertension)     Dr. Caryl [name] cardiologist  . Coronary artery disease   . Hypothyroidism     Past Surgical History  Procedure Laterality Date  . Cholecystectomy    . Cardiac catheterization      he was found to have normal coronary arteries but with [name] globally dilated and hypocontractile heart  . US echocardiography  03-17-2008, 07-03-2006    EF 15-20%, EF 15-20%  . Transurethral resection of bladder tumor N/[name] 08/13/2012    Procedure: TRANSURETHRAL RESECTION OF BLADDER TUMOR (TURBT);  Surgeon: Jamie Jabs, MD;  Location: [facility];  Service: Urology;  Laterality: N/[name];  MITOMYCIN C  . Icd insertion  04/2012-new ICD  . Endobronchial ultrasound Bilateral 10/01/2012    Procedure: ENDOBRONCHIAL ULTRASOUND;  Surgeon: Jamie Gobble, MD;  [facility];  Service:  Cardiopulmonary;  Laterality: Bilateral;  . Cornea replacement    . Video bronchoscopy N/[name] 10/25/2012    Procedure: VIDEO BRONCHOSCOPY;  Surgeon: Jamie Poot, MD;  Location: [place];  Service: Thoracic;  Laterality: N/[name];  . Mediastinoscopy N/[name] 10/25/2012    Procedure: MEDIASTINOSCOPY;  Surgeon: Jamie Poot, MD;  Location: [place];  Service: Thoracic;  Laterality: N/[name];  . Tonsillectomy    . Mediastinotomy  09/10/2013    paratracheal mass    DR [name]  . Chest tube insertion  09/10/2013  . Mediastinotomy chamberlain mcneil Right 09/10/2013    Procedure: MEDIASTINOTOMY CHAMBERLAIN MCNEIL;  Surgeon: Jamie Poot, MD;  Location: [place];  Service: Thoracic;  Laterality: Right;    History reviewed. No pertinent family history. Social History:  reports that he has never smoked. He has never used smokeless tobacco. He reports that he does not drink alcohol or use illicit drugs.  Allergies:  Allergies  Allergen Reactions  . Bidil [Isosorb Dinitrate-Hydralazine]     Headaches   . Hydralazine Hcl     Fuzzy headed  . Spironolactone     Hyperkalemia      (Not in [name] hospital admission)  Results for orders placed during the hospital encounter of 11/05/13 (from the past 48 hour(s))  APTT     Status: None   Collection Time  11/05/13  7:44 AM      Result Value Ref Range   aPTT 37  24 - 37 seconds   Comment:            IF BASELINE aPTT IS ELEVATED,     SUGGEST PATIENT RISK ASSESSMENT     BE USED TO DETERMINE APPROPRIATE     ANTICOAGULANT THERAPY.  CBC     Status: None   Collection Time    11/05/13  7:44 AM      Result Value Ref Range   WBC 9.0  4.0 - 10.5 K/uL   RBC 5.49  4.22 - 5.81 MIL/uL   Hemoglobin 16.3  13.0 - 17.0 g/dL   HCT 47.6  39.0 - 52.0 %   MCV 86.7  78.0 - 100.0 fL   MCH 29.7  26.0 - 34.0 pg   MCHC 34.2  30.0 - 36.0 g/dL   RDW 14.8  11.5 - 15.5 %   Platelets 209  150 - 400 K/uL  PROTIME-INR     Status: None   Collection Time    11/05/13  7:44 AM       Result Value Ref Range   Prothrombin Time 14.7  11.6 - 15.2 seconds   INR 1.15  0.00 - 1.49   No results found.  Review of Systems  Constitutional: Positive for weight loss. Negative for fever.  Eyes: Positive for blurred vision.       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   Cardiovascular: Negative for chest pain.  Gastrointestinal: Negative for nausea and vomiting.  Musculoskeletal: Negative for neck pain.  Neurological: Negative for dizziness, weakness and headaches.  Psychiatric/Behavioral: Negative for substance abuse.    Blood pressure 132/87, pulse 78, temperature 98 F (36.7 C), temperature source Oral, resp. rate 20, height 5\' 10"  (1.778 m), weight 109.77 kg (242 lb), SpO2 98.00%. Physical Exam  Constitutional: He is oriented to person, place, and time. He appears well-developed and well-nourished.  Eyes: Right eye exhibits no discharge.  Rt eye redness  Cardiovascular: Normal rate and regular rhythm.   No murmur heard. Respiratory: Effort normal and breath sounds normal. He has no wheezes.  GI: Soft. Bowel sounds are normal. There is no tenderness.  Musculoskeletal: Normal range of motion.  Neurological: He is alert and oriented to person, place, and time.  Skin: Skin is warm and dry.  Psychiatric: He has [name] normal mood and affect. His behavior is normal. Judgment and thought content normal.     Assessment/Plan Wt loss; fatigue R mediatinal mass Attempted mediastinotomy 09/2013 with Dr [name] Bleeding issues during surgery and stopped Now scheduled for needle biopsy of same Worrisome for lymphoma Pt aware of procedure benefits and risks and agreeable to proceed Consent signed andin chart  [name] 11/05/2013, 8:46 AM

## 2013-11-20 ENCOUNTER — Encounter (HOSPITAL_COMMUNITY): Payer: 59

## 2013-11-27 ENCOUNTER — Ambulatory Visit (INDEPENDENT_AMBULATORY_CARE_PROVIDER_SITE_OTHER): Payer: Self-pay | Admitting: Cardiothoracic Surgery

## 2013-11-27 ENCOUNTER — Encounter: Payer: Self-pay | Admitting: Cardiothoracic Surgery

## 2013-11-27 VITALS — BP 138/93 | HR 89 | Resp 18 | Ht 70.0 in | Wt 245.0 lb

## 2013-11-27 DIAGNOSIS — J9859 Other diseases of mediastinum, not elsewhere classified: Secondary | ICD-10-CM

## 2013-11-27 DIAGNOSIS — D447 Neoplasm of uncertain behavior of aortic body and other paraganglia: Secondary | ICD-10-CM

## 2013-11-27 DIAGNOSIS — R222 Localized swelling, mass and lump, trunk: Secondary | ICD-10-CM

## 2013-11-27 DIAGNOSIS — Z9889 Other specified postprocedural states: Secondary | ICD-10-CM

## 2013-11-27 NOTE — Progress Notes (Signed)
PCP is Gennette Pac, MD Referring Provider is Collene Gobble, MD  Chief Complaint  Patient presents with  . Follow-up    after NB of mediastinal mass    HPI: Patient returns to review results of needle biopsy with tumor at right thoracic apex. Tissue consistent with neuroendocrine tumor-paraganglioma. Patient has symptoms related to a swallowing and some shortness of breath from tracheal compression. The patient is still recovering from an anterior mediastinotomy and attempted biopsy. His heart failure symptoms are also currently active with abdominal bloating and dyspnea on exertion.  We discussed the results of the biopsy and tumor type. He understands the tumor will continue to grow and that the only effective therapy is surgical resection. The patient wishes to hold off on surgery until after the first year, mainly for financial reasons and the fact that his symptoms are minimal.   Past Medical History  Diagnosis Date  . Gout 08/03/12    PT C/O OF RIGHT KNEE PAIN AND SWELLING -STATES GOUT FLARE UP - ONGOING SINCE APRIL - BUT SWELLING W/IN LAST WEEK  . Cardiomyopathy     Idiopathic dilated;   Marland Kitchen Ventricular tachycardia     s/p ICD  . Sleep apnea     CPAP  . Systolic CHF     EF 70%  . CKD (chronic kidney disease)     stage III baseline Crt 1.8-2.0  . Biventricular ICD (implantable cardiac defibrillator) Medtronic ]     DOI 2008/ upgrade 2010/ Gen Change 2014  . Automatic implantable cardioverter-defibrillator in situ   . CHF (congestive heart failure)   . Bladder tumor     PT HOSP AT [facility] 4/24 TO 4/26 2014 WITH UTI--AND FOUND TO HAVE BLADDER TUMOR-  . Arthritis     GOUT  . Hilar mass     Noted CT 2010  . HTN (hypertension)     Dr. Caryl Comes cardiologist  . Coronary artery disease   . Hypothyroidism     Past Surgical History  Procedure Laterality Date  . Cholecystectomy    . Cardiac catheterization      he was found to have normal coronary arteries but with a  globally dilated and hypocontractile heart  . US echocardiography  03-17-2008, 07-03-2006    EF 15-20%, EF 15-20%  . Transurethral resection of bladder tumor N/A 08/13/2012    Procedure: TRANSURETHRAL RESECTION OF BLADDER TUMOR (TURBT);  Surgeon: Claybon Jabs, MD;  Location: [facility];  Service: Urology;  Laterality: N/A;  MITOMYCIN C  . Icd insertion  04/2012-new ICD  . Endobronchial ultrasound Bilateral 10/01/2012    Procedure: ENDOBRONCHIAL ULTRASOUND;  Surgeon: Collene Gobble, MD;  [facility];  Service: Cardiopulmonary;  Laterality: Bilateral;  . Cornea replacement    . Video bronchoscopy N/A 10/25/2012    Procedure: VIDEO BRONCHOSCOPY;  Surgeon: [name], MD;  Location: [place];  Service: Thoracic;  Laterality: N/A;  . Mediastinoscopy N/A 10/25/2012    Procedure: MEDIASTINOSCOPY;  Surgeon: [name], MD;  Location: [place];  Service: Thoracic;  Laterality: N/A;  . Tonsillectomy    . Mediastinotomy  09/10/2013    paratracheal mass    DR Kaleesi Guyton  . Chest tube insertion  09/10/2013  . Mediastinotomy chamberlain mcneil Right 09/10/2013    Procedure: MEDIASTINOTOMY CHAMBERLAIN MCNEIL;  Surgeon: [name], MD;  Location: [place];  Service: Thoracic;  Laterality: Right;    No family history on file.  Social History History  Substance Use Topics  .  Smoking status: Never Smoker   . Smokeless tobacco: Never Used  . Alcohol Use: No    Current Outpatient Prescriptions  Medication Sig Dispense Refill  . atorvastatin (LIPITOR) 40 MG tablet Take 40 mg by mouth at bedtime.       . carvedilol (COREG) 25 MG tablet Take 1 tablet (25 mg total) by mouth 2 (two) times daily with a meal.  60 tablet  6  . colchicine 0.6 MG tablet Take 0.6 mg by mouth daily as needed (gout).      Marland Kitchen digoxin (LANOXIN) 0.25 MG tablet Take 0.125 mg by mouth daily.      . Febuxostat (ULORIC) 80 MG TABS Take 80 mg by mouth at bedtime.      . furosemide (LASIX) 40 MG tablet Take 1 tablet (40 mg total)  by mouth every other day.  17 tablet  6  . levothyroxine (SYNTHROID, LEVOTHROID) 150 MCG tablet Take 150 mcg by mouth daily before breakfast.      . losartan (COZAAR) 25 MG tablet Take 1 tablet (25 mg total) by mouth daily.  30 tablet  3  . magnesium oxide (MAG-OX) 400 MG tablet Take 400 mg by mouth 2 (two) times daily.       Marland Kitchen OVER THE COUNTER MEDICATION Place 1 drop into both eyes daily as needed (red eyes). Eye drops       No current facility-administered medications for this visit.    Allergies  Allergen Reactions  . Bidil [Isosorb Dinitrate-Hydralazine]     Headaches   . Hydralazine Hcl     Fuzzy headed  . Spironolactone     Hyperkalemia     Review of Systems Patient's gout is currently inactive The patient currently has right conjunctivitis treated with antibiotic eyedrops The patient complains of abdominal bloating and dyspnea exertion consistent with his chronic heart failure and EF 15% from nonischemic cardiomyopathy  BP 138/93  Pulse 89  Resp 18  Ht 5\' 10"  (1.778 m)  Wt 245 lb (111.131 kg)  BMI 35.15 kg/m2  SpO2 98% Physical Exam Comfortable at rest Ambulating the patient 250 feet resulted in increased heart rate from 92-105 and oxygen saturation remaining stable at 98% No JVD or neck mass Lungs clear  Diagnostic Tests: No x-rays today  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 The patient will need a CT scan of the thoracic spine to assess for potential nerve root involvement--the patient has an AICD so MRI is not available.  Plan: Return with CT of thoracic spine in 6 weeks to discuss timing of surgery and to review his clinical status. Patient understands that a posterior lateral thoracotomy would be required for the resection of the paraganglioma and this would be at increased risk due to the patient's cardiomyopathy

## 2013-12-03 ENCOUNTER — Encounter (HOSPITAL_COMMUNITY): Payer: 59

## 2013-12-03 ENCOUNTER — Other Ambulatory Visit: Payer: Self-pay | Admitting: *Deleted

## 2013-12-03 DIAGNOSIS — D447 Neoplasm of uncertain behavior of aortic body and other paraganglia: Secondary | ICD-10-CM

## 2013-12-05 ENCOUNTER — Telehealth (HOSPITAL_COMMUNITY): Payer: Self-pay | Admitting: Vascular Surgery

## 2013-12-05 NOTE — Telephone Encounter (Signed)
[facility] called they have changed manufacture for digxon.. They need a call back to get approval to refill this medication

## 2013-12-05 NOTE — Telephone Encounter (Signed)
Spoke w/pharmacy they are changing manufacturer and want to make sure it is ok and that we will be seeing pt soon to f/u with him, advised pt has appt 10/13 will check labs and that time and make sure pt is not having any side effects

## 2013-12-09 ENCOUNTER — Telehealth (HOSPITAL_COMMUNITY): Payer: Self-pay | Admitting: Vascular Surgery

## 2013-12-09 NOTE — Telephone Encounter (Signed)
Refill Atorvastatin 

## 2013-12-12 ENCOUNTER — Ambulatory Visit (INDEPENDENT_AMBULATORY_CARE_PROVIDER_SITE_OTHER): Payer: 59 | Admitting: Internal Medicine

## 2013-12-12 ENCOUNTER — Encounter: Payer: Self-pay | Admitting: Internal Medicine

## 2013-12-12 VITALS — BP 122/84 | HR 83 | Ht 70.0 in | Wt 251.8 lb

## 2013-12-12 DIAGNOSIS — I255 Ischemic cardiomyopathy: Secondary | ICD-10-CM

## 2013-12-12 DIAGNOSIS — Z95810 Presence of automatic (implantable) cardiac defibrillator: Secondary | ICD-10-CM

## 2013-12-12 DIAGNOSIS — I472 Ventricular tachycardia: Secondary | ICD-10-CM

## 2013-12-12 DIAGNOSIS — I5042 Chronic combined systolic (congestive) and diastolic (congestive) heart failure: Secondary | ICD-10-CM

## 2013-12-12 DIAGNOSIS — I4729 Other ventricular tachycardia: Secondary | ICD-10-CM

## 2013-12-12 DIAGNOSIS — I5022 Chronic systolic (congestive) heart failure: Secondary | ICD-10-CM

## 2013-12-12 LAB — MDC_IDC_ENUM_SESS_TYPE_INCLINIC
Battery Voltage: 2.99 V
Brady Statistic AP VS Percent: 0.01 %
Brady Statistic AS VP Percent: 99.4 %
Brady Statistic AS VS Percent: 0.55 %
HIGH POWER IMPEDANCE MEASURED VALUE: 190 Ohm
HIGH POWER IMPEDANCE MEASURED VALUE: 46 Ohm
HIGH POWER IMPEDANCE MEASURED VALUE: 55 Ohm
Lead Channel Impedance Value: 285 Ohm
Lead Channel Impedance Value: 418 Ohm
Lead Channel Impedance Value: 456 Ohm
Lead Channel Impedance Value: 456 Ohm
Lead Channel Impedance Value: 608 Ohm
Lead Channel Pacing Threshold Amplitude: 0.75 V
Lead Channel Pacing Threshold Pulse Width: 0.4 ms
Lead Channel Pacing Threshold Pulse Width: 0.4 ms
Lead Channel Pacing Threshold Pulse Width: 0.4 ms
Lead Channel Sensing Intrinsic Amplitude: 19.25 mV
Lead Channel Sensing Intrinsic Amplitude: 3 mV
Lead Channel Setting Pacing Amplitude: 1.75 V
Lead Channel Setting Pacing Amplitude: 2 V
Lead Channel Setting Pacing Pulse Width: 0.4 ms
Lead Channel Setting Pacing Pulse Width: 0.4 ms
MDC IDC MSMT BATTERY REMAINING LONGEVITY: 79 mo
MDC IDC MSMT LEADCHNL RA PACING THRESHOLD AMPLITUDE: 0.5 V
MDC IDC MSMT LEADCHNL RA SENSING INTR AMPL: 3.25 mV
MDC IDC MSMT LEADCHNL RV PACING THRESHOLD AMPLITUDE: 0.75 V
MDC IDC MSMT LEADCHNL RV SENSING INTR AMPL: 19.75 mV
MDC IDC SESS DTM: 20151008105017
MDC IDC SET LEADCHNL RV PACING AMPLITUDE: 2.5 V
MDC IDC SET LEADCHNL RV SENSING SENSITIVITY: 0.6 mV
MDC IDC SET ZONE DETECTION INTERVAL: 400 ms
MDC IDC SET ZONE DETECTION INTERVAL: 450 ms
MDC IDC STAT BRADY AP VP PERCENT: 0.04 %
MDC IDC STAT BRADY RA PERCENT PACED: 0.05 %
MDC IDC STAT BRADY RV PERCENT PACED: 98.85 %
Zone Setting Detection Interval: 250 ms
Zone Setting Detection Interval: 300 ms
Zone Setting Detection Interval: 410 ms

## 2013-12-12 NOTE — Progress Notes (Signed)
Patient Care Team: Hulan Fess, MD as PCP - General (Family Medicine) Deboraha Sprang, MD as PCP - Cardiology (Cardiology) Collene Gobble, MD as Consulting Physician (Pulmonary Disease) Beryle Lathe, MD (General Surgery)   HPI  [name] is a 53 y.o. male is seen in followup for congestive heart failure in the setting of nonischemic cardiomyopathy. He is status post CRT-D. implantation. He had been previously treated for VT with amiodarone which was discontinued because of  lightheadedness He was seen in jan 2103 with flurries of VT requiring therapy which I had hoped might be attributable to mechanical-electric issues aggravated by CHF   He was hospitalized again 11/13 for multiple inappropriate ICD shocks attributed to hypokalemia secondary acute renal insufficiency and T wave oversensing.  The device was reprogrammed to decrease sensitivity from 0.3-0.6 mV and the NID was increased.   He also has OSA on CPAP. 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  Echocardiogram July 15 EF 10-15%; there is severe LAE (52/2.2)   Dig level 8/15 0.5   his biggest complaint is now we'll early satiety and abdominal bloating. He also has significant fatigue and does not tolerate his diuretics very well in that he feels like it is more drained. Interestingly his response to his furosemide is quite protracted   Past Medical History  Diagnosis Date  . Gout 08/03/12    PT C/O OF RIGHT KNEE PAIN AND SWELLING -STATES GOUT FLARE UP - ONGOING SINCE APRIL - BUT SWELLING W/IN LAST WEEK  . Cardiomyopathy     Idiopathic dilated;   Marland Kitchen Ventricular tachycardia     s/p ICD  . Sleep apnea     CPAP  . Systolic CHF     EF 16%  . CKD (chronic kidney disease)     stage III baseline Crt 1.8-2.0  . Biventricular ICD (implantable cardiac defibrillator) Medtronic ]     DOI 2008/ upgrade 2010/ Gen Change 2014  . Automatic  implantable cardioverter-defibrillator in situ   . CHF (congestive heart failure)   . Bladder tumor     PT HOSP AT [facility] 4/24 TO 4/26 2014 WITH UTI--AND FOUND TO HAVE BLADDER TUMOR-  . Arthritis     GOUT  . Hilar mass     Noted CT 2010  . HTN (hypertension)     Dr. Caryl Comes cardiologist  . Coronary artery disease   . Hypothyroidism     Past Surgical History  Procedure Laterality Date  . Cholecystectomy    . Cardiac catheterization      he was found to have normal coronary arteries but with a globally dilated and hypocontractile heart  . US echocardiography  03-17-2008, 07-03-2006    EF 15-20%, EF 15-20%  . Transurethral resection of bladder tumor N/A 08/13/2012    Procedure: TRANSURETHRAL RESECTION OF BLADDER TUMOR (TURBT);  Surgeon: Claybon Jabs, MD;  Location: [facility];  Service: Urology;  Laterality: N/A;  MITOMYCIN C  . Icd insertion  04/2012-new ICD  . Endobronchial ultrasound Bilateral 10/01/2012    Procedure: ENDOBRONCHIAL ULTRASOUND;  Surgeon: Collene Gobble, MD;  [facility]: [facility];  Service: Cardiopulmonary;  Laterality: Bilateral;  . Cornea replacement    . Video bronchoscopy N/A 10/25/2012    Procedure: VIDEO BRONCHOSCOPY;  Surgeon: Jamie Poot, MD;  Location: [facility];  Service: Thoracic;  Laterality: N/A;  . Mediastinoscopy N/A 10/25/2012    Procedure: MEDIASTINOSCOPY;  Surgeon: Tharon Aquas  Kerby Less, MD;  Location: [facility];  Service: Thoracic;  Laterality: N/A;  . Tonsillectomy    . Mediastinotomy  09/10/2013    paratracheal mass    DR VANTRIGT  . Chest tube insertion  09/10/2013  . Mediastinotomy chamberlain mcneil Right 09/10/2013    Procedure: MEDIASTINOTOMY CHAMBERLAIN MCNEIL;  Surgeon: Jamie Poot, MD;  Location: [facility] OR;  Service: Thoracic;  Laterality: Right;    Current Outpatient Prescriptions  Medication Sig Dispense Refill  . atorvastatin (LIPITOR) 40 MG tablet Take 40 mg by mouth at bedtime.       . carvedilol (COREG) 25 MG tablet Take 1 tablet (25 mg total)  by mouth 2 (two) times daily with a meal.  60 tablet  6  . colchicine 0.6 MG tablet Take 0.6 mg by mouth daily as needed (gout).      Marland Kitchen digoxin (LANOXIN) 0.25 MG tablet Take 0.125 mg by mouth daily.      . Febuxostat (ULORIC) 80 MG TABS Take 80 mg by mouth at bedtime.      . furosemide (LASIX) 40 MG tablet Take 1 tablet (40 mg total) by mouth every other day.  17 tablet  6  . levothyroxine (SYNTHROID, LEVOTHROID) 150 MCG tablet Take 150 mcg by mouth daily before breakfast.      . losartan (COZAAR) 25 MG tablet Take 1 tablet (25 mg total) by mouth daily.  30 tablet  3  . magnesium oxide (MAG-OX) 400 MG tablet Take 400 mg by mouth 2 (two) times daily.       Marland Kitchen OVER THE COUNTER MEDICATION Place 1 drop into both eyes daily as needed (red eyes). Eye drops       No current facility-administered medications for this visit.    Allergies  Allergen Reactions  . Bidil [Isosorb Dinitrate-Hydralazine]     Headaches   . Spironolactone     Hyperkalemia   . Hydralazine Hcl     Fuzzy headed    Review of Systems negative except from HPI and PMH  Physical Exam BP 122/84  Pulse 83  Ht 5\' 10"  (1.778 m)  Wt 251 lb 12.8 oz (114.216 kg)  BMI 36.13 kg/m2 Well developed and well nourished in no acute distress HENT normal E scleral and icterus clear Neck Supple JVP 8-10 cm; carotids brisk and full Clear to ausculation  Regular rate and rhythm, no murmurs gallops or rub Soft with active bowel sounds; fall  No clubbing cyanosis none Edema Alert and oriented, grossly normal motor and sensory function Skin Warm and Dry   ECG P. synchronous biventricular pacing   Assessment and  Plan  Nonischemic cardiomyopathy   Ventricular tachycardia-paroxysmal  Congestive heart failure-chronic-systolic   Implantable defibrillator-CRT-Medtronic The patient's device was interrogated.  The information was reviewed. No changes were made in the programming.     Renal insufficiency-grade 3   We will have him  followup with renal this afternoon and I've asked them whether he can undergo a 4-to five-day diuretic trial to see if we can attenuate his loading and abdominal fullness. He is to see the arterial clinic next week and I would ask them to consider whether with his right-sided symptoms he might benefit more from torsemide.   is rhythm issues are stable

## 2013-12-12 NOTE — Patient Instructions (Signed)

## 2013-12-13 MED ORDER — ATORVASTATIN CALCIUM 40 MG PO TABS: 40.0000 mg | ORAL_TABLET | Freq: Every day | ORAL | Status: DC

## 2013-12-17 ENCOUNTER — Encounter (HOSPITAL_COMMUNITY): Payer: Self-pay

## 2013-12-17 ENCOUNTER — Ambulatory Visit (HOSPITAL_COMMUNITY)
Admission: RE | Admit: 2013-12-17 | Discharge: 2013-12-17 | Disposition: A | Discharge status: Home / Self Care | Payer: 59 | Source: Ambulatory Visit | Attending: Internal Medicine | Admitting: Internal Medicine

## 2013-12-17 VITALS — BP 120/78 | HR 96 | Wt 255.1 lb

## 2013-12-17 DIAGNOSIS — I5022 Chronic systolic (congestive) heart failure: Secondary | ICD-10-CM | POA: Diagnosis not present

## 2013-12-17 DIAGNOSIS — J9859 Other diseases of mediastinum, not elsewhere classified: Secondary | ICD-10-CM

## 2013-12-17 DIAGNOSIS — I472 Ventricular tachycardia: Secondary | ICD-10-CM | POA: Diagnosis not present

## 2013-12-17 DIAGNOSIS — I509 Heart failure, unspecified: Secondary | ICD-10-CM | POA: Diagnosis present

## 2013-12-17 DIAGNOSIS — R222 Localized swelling, mass and lump, trunk: Secondary | ICD-10-CM | POA: Diagnosis not present

## 2013-12-17 DIAGNOSIS — G4733 Obstructive sleep apnea (adult) (pediatric): Secondary | ICD-10-CM | POA: Diagnosis not present

## 2013-12-17 DIAGNOSIS — C679 Malignant neoplasm of bladder, unspecified: Secondary | ICD-10-CM | POA: Diagnosis not present

## 2013-12-17 DIAGNOSIS — I4729 Other ventricular tachycardia: Secondary | ICD-10-CM

## 2013-12-17 MED ORDER — TORSEMIDE 20 MG PO TABS: 20.0000 mg | ORAL_TABLET | Freq: Every day | ORAL | Status: DC

## 2013-12-17 NOTE — Patient Instructions (Signed)
Stop Furosemide  Start Torsemide 20 mg daily  Lab in 1 week (bmet)  Your physician recommends that you schedule a follow-up appointment in: 1 month

## 2013-12-18 NOTE — Progress Notes (Signed)
Patient ID: [name], male   DOB: 09-24-1960, 53 y.o.   MRN: [id number] PCP: Dr Rex Kras  Nephrologist: Dr Marval Regal  Cardiologist: Dr Caryl Comes Urologist: Dr Karsten Ro  HPI: Mr Rueb is a 53 year old with a PMH of obesity, gout, CHF due to nonischemic cardiomyopathy S/P Medtronic CRT-D implantation 2010, VT (intolerant of amiodarone due to lightheadedness.), hypothyroidism, bladder cancer and OSA (CPAP) and chest mass (paraganglioma).   He is intolerant to numerous HF meds including spiro (severe hyperkalemia - requiring HD), hydralazine (fuzzy-headed) and imdur (severe HAs). S/P 12/17/12 bladder cancer removal.   RHC 01/28/12  RA = 1  RV = 28/2/4  PA = 32/17 (22)  PCW = 6  Fick cardiac output/index = 5.1/2.2  Thermo cardiac output/index = 5.0/2.1  PVR = 4.0 Woods  FA sat = 98%  PA sat = 64%, 68%   11/29/11 ECHO EF 15% Diffuse hypokinesis. Features are consistent with a pseudonormal left ventricular filling pattern, with concomitant abnormal relaxation and increased filling pressure (grade 2 diastolic dysfunction).   Admitted to [facility] 01/26/2012 after his ICD fired 5 times Potassium >7.5 Required urgent dialysis. Discharged form [facility] 01/29/12. Discharge weight 250 pounds.   CPX test 12/13  Resting HR: 90 Peak HR: 150 % age predicted max HR: 89% BP rest: 121/88 BP peak: 189/112 Peak VO2: 15.2 % predicted peak VO2: 58.3% (When adjusted to the patient's ideal body weight of 176 lb (79.8 kg) the peak VO2 is 22 ml/kg (ibw)/min (63% of the ibw-adjusted predicted) VE/VCO2 slope: 26.4 OUES: 2.29 Peak RER: 1.05 Ventilatory Threshold: 12.3 % predicted peak VO2: 47.2%  CPX 07/12/13 FVC 1.55 (37%)  FEV1 1.19 (36%)  FEV1/FVC 77%  MVV 53 (35%) Exercise Time: 3:30 Speed (mph): 1.5 Grade (%): 0  RPE: 15 Resting HR: 109 Peak HR: 141 (84% age predicted max HR) BP rest: 130/90 BP peak: 142/96 Peak VO2: 9.0 (34.7% predicted peak VO2)  VE/VCO2 slope: 32.0 OUES: 1.90 Peak RER: 0.90 Ventilatory  Threshold: Could not be detected VE/MVV: 58.5% PETCO2 at peak: 33 O2pulse: 8 (44% predicted O2pulse)  ECHO 02/25/13 EF <15%   Admitted in July for [facility] procedure for biopsy of a right mid mediastinal mass. At the time of surgery the mass was quite vascular and and attempted wedge biopsy resulted in severe bleeding. Needle aspirate of the mass more posteriorly resulted in bleeding as well. Both sites were surgically closed. The mediastinal tissue on the surface of the mass was sent for pathology which was unremarkable. A postoperative CT scan with contrast showed no clear feeding blood vessel to suggest this was a hemangioma or AV malformation. While in hospital treated with milrinone and renal function improved. 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   Post-op He did develop gout in his right knee which required a Depo-Medrol injection by his rheumatologist and uloric increased.   Today, weight is up 9 lbs.  He is short of breath/fatigued after walking 75-100 yards or walking up 1 flight of steps.  He is currently taking Lasix 20 mg daily.  He feels like he cannot take Lasix 40 mg daily (too strong, "wipes me out.").  He has minimal appetite.  No orthopnea/PND.   Optivol was reviewed today: fluid index is below threshold and thoracic impedance is stable.  Labs  12/12/12 dig level 0.9 12/18/12 Magnesium 1.1 K 4.0 Creatinine 1.5 01/02/13: Uric acid 12.9, K+ 3.9, Cr 1.37, AST 21, ALT 19, Mag 1.3 01/29/13 K 4.1  Creatinine 1.5 Magnesium 1.5 03/21/13 Magnesium 1.7 TSH 1.11--> synthorid increased to 100 mcg.  7/15: K 4, creatinine 1.35 8/15: digoxin 0.5  ROS: All systems negative except as listed in HPI, PMH and Problem List.  Past Medical History  Diagnosis Date  . Gout 08/03/12    PT C/O OF RIGHT KNEE PAIN AND SWELLING -STATES GOUT FLARE UP - ONGOING SINCE APRIL - BUT SWELLING W/IN LAST WEEK  . Cardiomyopathy     Idiopathic  dilated;   Marland Kitchen Ventricular tachycardia     s/p ICD  . Sleep apnea     CPAP  . Systolic CHF     EF 85%  . CKD (chronic kidney disease)     stage III baseline Crt 1.8-2.0  . Biventricular ICD (implantable cardiac defibrillator) Medtronic ]     DOI 2008/ upgrade 2010/ Gen Change 2014  . Automatic implantable cardioverter-defibrillator in situ   . CHF (congestive heart failure)   . Bladder tumor     PT HOSP AT [facility] 4/24 TO 4/26 2014 WITH UTI--AND FOUND TO HAVE BLADDER TUMOR-  . Arthritis     GOUT  . Hilar mass     Noted CT 2010  . HTN (hypertension)     Dr. Caryl Comes cardiologist  . Coronary artery disease   . Hypothyroidism     Current Outpatient Prescriptions  Medication Sig Dispense Refill  . atorvastatin (LIPITOR) 40 MG tablet Take 1 tablet (40 mg total) by mouth at bedtime.  30 tablet  2  . carvedilol (COREG) 25 MG tablet Take 1 tablet (25 mg total) by mouth 2 (two) times daily with a meal.  60 tablet  6  . colchicine 0.6 MG tablet Take 0.6 mg by mouth daily as needed (gout).      Marland Kitchen digoxin (LANOXIN) 0.25 MG tablet Take 0.125 mg by mouth daily.      . Febuxostat (ULORIC) 80 MG TABS Take 80 mg by mouth at bedtime.      Marland Kitchen levothyroxine (SYNTHROID, LEVOTHROID) 150 MCG tablet Take 150 mcg by mouth daily before breakfast.      . losartan (COZAAR) 25 MG tablet Take 1 tablet (25 mg total) by mouth daily.  30 tablet  3  . magnesium oxide (MAG-OX) 400 MG tablet Take 400 mg by mouth 2 (two) times daily.       Marland Kitchen OVER THE COUNTER MEDICATION Place 1 drop into both eyes daily as needed (red eyes). Eye drops      . torsemide (DEMADEX) 20 MG tablet Take 1 tablet (20 mg total) by mouth daily.  30 tablet  3   No current facility-administered medications for this encounter.     Filed Vitals:   12/17/13 1521  BP: 120/78  Pulse: 96  Weight: 255 lb 1.9 oz (115.722 kg)  SpO2: 96%    PHYSICAL EXAM: General:  Well appearing. No resp difficulty HEENT: normal Neck: supple. JVP 8-9 cm with HJR.  Carotids 2+ bilaterally; no bruits. No lymphadenopathy or thryomegaly appreciated. R neck scar  Cor: PMI normal. Regular rate & rhythm. No rubs, gallops or murmurs. Lungs: clear Abdomen: obese, soft, nontender, nondistended. No hepatosplenomegaly. No bruits or masses. Good bowel sounds. Extremities: no cyanosis, clubbing, rash, edema Neuro: alert & orientedx3, cranial nerves grossly intact. Moves all 4 extremities w/o difficulty. Affect pleasant.   ASSESSMENT & PLAN:  1) Chronic systolic HF: NICM, EF 88% (02/2013) s/p Medtronic Optivol  ICD 04/2012 . NYHA II-III symptoms with mild volume overload.  - I  am going to stop Lasix and start torsemide 20 mg daily. This may give more even release throughout the day so that he does not feel as "wiped out" as he did with Lasix.  Check BMET/digoxin level in 7-10 days.  - On goal dose coreg 25 mg BID. - Continue digoxin 0.125 mg daily and losartan.  - He is intolerant to numerous HF meds including spiro (severe hyperkalemia - requiring HD), hydralazine (fuzzy-headed) and imdur (severe HAs). - Overall he has been stable from HF standpoint though his renal function did improve significantly with milrinone while in hospital and I suspect his cardiac output is marginal. His EF has been 10-15% since at least 2008 and in the past, we have discussed with him concern that he may need advanced therapies. He is being treated for bladder cancer currently and would have to be in remission for at least 5 years to consider heart transplant. He could possibly need LVAD in the not so distant future. CPX 5/15 concerning but RER was submaximal. If he significantly worsens, will need RBC.  2) Right thoracic apex mass:  Paraganglioma.  Dr. Darcey Nora planning removal with posterolateral thoracotomy in in 12/15.  3) OSA: Continue CPAP nightly 4) Bladder Cancer: Continuing with BCG treatment 5) VT:  Quiescent. Followed by Dr. Sharen Counter 12/18/2013

## 2013-12-23 ENCOUNTER — Encounter (HOSPITAL_COMMUNITY): Payer: 59

## 2013-12-24 ENCOUNTER — Ambulatory Visit (HOSPITAL_COMMUNITY)
Admission: RE | Admit: 2013-12-24 | Discharge: 2013-12-24 | Disposition: A | Discharge status: Home / Self Care | Payer: 59 | Source: Ambulatory Visit | Attending: Cardiology | Admitting: Cardiology

## 2013-12-24 ENCOUNTER — Telehealth: Payer: Self-pay | Admitting: *Deleted

## 2013-12-24 DIAGNOSIS — I5042 Chronic combined systolic (congestive) and diastolic (congestive) heart failure: Secondary | ICD-10-CM | POA: Insufficient documentation

## 2013-12-24 DIAGNOSIS — M109 Gout, unspecified: Secondary | ICD-10-CM

## 2013-12-24 DIAGNOSIS — I5022 Chronic systolic (congestive) heart failure: Secondary | ICD-10-CM

## 2013-12-24 LAB — BASIC METABOLIC PANEL
Anion gap: 14 (ref 5–15)
BUN: 24 mg/dL — AB (ref 6–23)
CHLORIDE: 101 meq/L (ref 96–112)
CO2: 23 mEq/L (ref 19–32)
CREATININE: 1.39 mg/dL — AB (ref 0.50–1.35)
Calcium: 9.1 mg/dL (ref 8.4–10.5)
GFR calc Af Amer: 65 mL/min — ABNORMAL LOW (ref >90)
GFR calc non Af Amer: 56 mL/min — ABNORMAL LOW (ref >90)
GLUCOSE: 101 mg/dL — AB (ref 70–99)
Potassium: 4.4 mEq/L (ref 3.7–5.3)
Sodium: 138 mEq/L (ref 137–147)

## 2013-12-24 LAB — URIC ACID: Uric Acid, Serum: 6.5 mg/dL (ref 4.0–7.8)

## 2013-12-24 NOTE — Addendum Note (Signed)
Encounter addended by: Kerry Dory, CMA on: 12/24/2013  3:53 PM<BR>     Documentation filed: Visit Diagnoses, Orders

## 2013-12-24 NOTE — Telephone Encounter (Signed)
Pt wanted f/u call to clarify OptiVol notification within his device. Per industry, his option to be notified if his transthoracic impedance portrays more saturation cannot be activated per FDA guidelines. It is a feature that is allowable in other countries but not yet here in the [place] Pt expressed understanding.

## 2014-01-10 ENCOUNTER — Ambulatory Visit (INDEPENDENT_AMBULATORY_CARE_PROVIDER_SITE_OTHER): Payer: 59 | Admitting: Pulmonary Disease

## 2014-01-10 ENCOUNTER — Encounter: Payer: Self-pay | Admitting: Pulmonary Disease

## 2014-01-10 ENCOUNTER — Ambulatory Visit: Payer: 59 | Admitting: Pulmonary Disease

## 2014-01-10 VITALS — BP 130/74 | HR 94 | Temp 97.1°F | Ht 70.0 in | Wt 254.2 lb

## 2014-01-10 DIAGNOSIS — G4733 Obstructive sleep apnea (adult) (pediatric): Secondary | ICD-10-CM

## 2014-01-10 NOTE — Assessment & Plan Note (Signed)
The patient is doing very well with C Pap by his current download, and I have asked him to continue with the device while working on further weight loss. He would like to try a different full face mask, and I will send an order for him. I have also encouraged him to keep up with his cushion changes and supplies as well. I'll see him back in one year if he is doing well.

## 2014-01-10 NOTE — Progress Notes (Signed)
   Subjective:    Patient ID: [name], male    DOB: Jul 09, 1960, 53 y.o.   MRN: [id number]  HPI The patient comes in today for follow-up of his obstructive sleep apnea. He is wearing C Pap compliantly by his download for about 5 hours a night, and has excellent control of his AHI. He is having no significant mask leak. The patient feels that he is resting well with the device, and is satisfied with his daytime alertness. He would like to look at different types of fullface masks.  Of note, he has lost a little bit of weight since the last visit.   Review of Systems  Constitutional: Negative for fever and unexpected weight change.  HENT: Negative for congestion, dental problem, ear pain, nosebleeds, postnasal drip, rhinorrhea, sinus pressure, sneezing, sore throat and trouble swallowing.   Eyes: Negative for redness and itching.  Respiratory: Negative for cough, chest tightness, shortness of breath and wheezing.   Cardiovascular: Negative for palpitations and leg swelling.  Gastrointestinal: Negative for nausea and vomiting.  Genitourinary: Negative for dysuria.  Musculoskeletal: Negative for joint swelling.  Skin: Negative for rash.  Neurological: Negative for headaches.  Hematological: Does not bruise/bleed easily.  Psychiatric/Behavioral: Negative for dysphoric mood. The patient is not nervous/anxious.        Objective:   Physical Exam Overweight male in no acute distress Nose without purulence or discharge noted No skin breakdown or pressure necrosis from the C Pap mask Neck without lymphadenopathy or thyromegaly Lower extremities with mild edema, no cyanosis Alert and oriented, does not appear to be sleepy, moves all 4 extremities.       Assessment & Plan:

## 2014-01-10 NOTE — Patient Instructions (Signed)
Your download shows that your sleep apnea is well controlled.  Keep up with mask changes and supplies. Keep working on weight loss Will send an order to your homecare company to show you different types of full face masks.  followup with me again in one year if doing well.

## 2014-01-15 ENCOUNTER — Ambulatory Visit
Admission: RE | Admit: 2014-01-15 | Discharge: 2014-01-15 | Disposition: A | Discharge status: Home / Self Care | Payer: 59 | Source: Ambulatory Visit | Attending: Cardiothoracic Surgery | Admitting: Cardiothoracic Surgery

## 2014-01-15 ENCOUNTER — Ambulatory Visit (INDEPENDENT_AMBULATORY_CARE_PROVIDER_SITE_OTHER): Payer: 59 | Admitting: Cardiothoracic Surgery

## 2014-01-15 ENCOUNTER — Other Ambulatory Visit: Payer: Self-pay | Admitting: Cardiothoracic Surgery

## 2014-01-15 ENCOUNTER — Encounter: Payer: Self-pay | Admitting: Cardiothoracic Surgery

## 2014-01-15 ENCOUNTER — Other Ambulatory Visit: Payer: Self-pay

## 2014-01-15 VITALS — BP 138/90 | HR 78 | Resp 20 | Ht 70.0 in | Wt 254.0 lb

## 2014-01-15 DIAGNOSIS — J9859 Other diseases of mediastinum, not elsewhere classified: Secondary | ICD-10-CM

## 2014-01-15 DIAGNOSIS — D447 Neoplasm of uncertain behavior of aortic body and other paraganglia: Secondary | ICD-10-CM

## 2014-01-15 DIAGNOSIS — R222 Localized swelling, mass and lump, trunk: Secondary | ICD-10-CM

## 2014-01-16 NOTE — Progress Notes (Signed)
PCP is Gennette Pac, MD Referring Provider is Collene Gobble, MD  Chief Complaint  Patient presents with  . 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 A follow-up CT scan today shows no change in size over the past several months. At the last office visit the patient wished to have the surgical resection performed after January 1. He has some mild difficulty swallowing due to extrinsic compression but no shortness of breath. He now wishes to have the surgery scheduled before the end of the year.  The patient has advanced stage heart failure , ejection fraction 15%,from nonischemic cardiomyopathy followed carefully by the heart failure service. After his mediastinal biopsy the patient developed acute renal failure and required intravenous milrinone for several days.the patient also has a history of gout, intramural bladder cancer status post resection but receiving BCG irrigation therapy, sleep apnea-COPD and hypothyroidism.  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 No bronchitis or influenza symptoms.   Past Medical History  Diagnosis Date  . Gout 08/03/12    PT C/O OF RIGHT KNEE PAIN AND SWELLING -STATES GOUT FLARE UP - ONGOING SINCE APRIL - BUT SWELLING W/IN LAST WEEK  . Cardiomyopathy     Idiopathic dilated;   Marland Kitchen Ventricular tachycardia     s/p ICD  . Sleep apnea     CPAP  . Systolic CHF     EF 17%  . CKD (chronic kidney disease)     stage III baseline Crt 1.8-2.0  . Biventricular ICD (implantable cardiac defibrillator) Medtronic ]     DOI 2008/ upgrade 2010/ Gen Change 2014  . Automatic implantable cardioverter-defibrillator in situ   . CHF (congestive heart failure)   . Bladder tumor     PT HOSP AT [facility] 4/24 TO 4/26 2014 WITH UTI--AND FOUND TO HAVE BLADDER TUMOR-  . Arthritis     GOUT  . Hilar mass     Noted CT 2010   . HTN (hypertension)     Dr. Caryl Comes cardiologist  . Coronary artery disease   . Hypothyroidism     Past Surgical History  Procedure Laterality Date  . Cholecystectomy    . Cardiac catheterization      he was found to have normal coronary arteries but with a globally dilated and hypocontractile heart  . US echocardiography  03-17-2008, 07-03-2006    EF 15-20%, EF 15-20%  . Transurethral resection of bladder tumor N/A 08/13/2012    Procedure: TRANSURETHRAL RESECTION OF BLADDER TUMOR (TURBT);  Surgeon: Claybon Jabs, MD;  Location: [facility];  Service: Urology;  Laterality: N/A;  MITOMYCIN C  . Icd insertion  04/2012-new ICD  . Endobronchial ultrasound Bilateral 10/01/2012    Procedure: ENDOBRONCHIAL ULTRASOUND;  Surgeon: Collene Gobble, MD;  Location: [facility];  Service: Cardiopulmonary;  Laterality: Bilateral;  . Cornea replacement    . Video bronchoscopy N/A 10/25/2012    Procedure: VIDEO BRONCHOSCOPY;  Surgeon: [name], MD;  Location: [place];  Service: Thoracic;  Laterality: N/A;  . Mediastinoscopy N/A 10/25/2012    Procedure: MEDIASTINOSCOPY;  Surgeon: [name], MD;  Location: [place];  Service: Thoracic;  Laterality: N/A;  . Tonsillectomy    . Mediastinotomy  09/10/2013    paratracheal mass    DR Ivan Maskell  . Chest tube insertion  09/10/2013  . Mediastinotomy chamberlain mcneil Right 09/10/2013  Procedure: MEDIASTINOTOMY CHAMBERLAIN MCNEIL;  Surgeon: [name], MD;  Location: [place];  Service: Thoracic;  Laterality: Right;    No family history on file.  Social History History  Substance Use Topics  . Smoking status: Never Smoker   . Smokeless tobacco: Never Used  . Alcohol Use: No    Current Outpatient Prescriptions  Medication Sig Dispense Refill  . atorvastatin (LIPITOR) 40 MG tablet Take 1 tablet (40 mg total) by mouth at bedtime. 30 tablet 2  . carvedilol (COREG) 25 MG tablet Take 1 tablet (25 mg total) by mouth 2 (two) times daily with a meal. 60  tablet 6  . colchicine 0.6 MG tablet Take 0.6 mg by mouth daily as needed (gout).    Marland Kitchen digoxin (LANOXIN) 0.25 MG tablet Take 0.125 mg by mouth daily.    . Febuxostat (ULORIC) 80 MG TABS Take 80 mg by mouth at bedtime.    Marland Kitchen levothyroxine (SYNTHROID, LEVOTHROID) 150 MCG tablet Take 150 mcg by mouth daily before breakfast.    . losartan (COZAAR) 25 MG tablet Take 1 tablet (25 mg total) by mouth daily. 30 tablet 3  . magnesium oxide (MAG-OX) 400 MG tablet Take 400 mg by mouth 2 (two) times daily.     Marland Kitchen OVER THE COUNTER MEDICATION Place 1 drop into both eyes daily as needed (red eyes). Eye drops    . torsemide (DEMADEX) 20 MG tablet Take 1 tablet (20 mg total) by mouth daily. 30 tablet 3   No current facility-administered medications for this visit.    Allergies  Allergen Reactions  . Bidil [Isosorb Dinitrate-Hydralazine]     Headaches   . Spironolactone     Hyperkalemia   . Hydralazine Hcl     Fuzzy headed    Review of Systemsheart failure symptoms slightly worse and patient is taking slightly more diuretic therapy  BP 138/90 mmHg  Pulse 78  Resp 20  Ht 5\' 10"  (1.778 m)  Wt 254 lb (115.214 kg)  BMI 36.45 kg/m2  SpO2 99% Physical Exam Alert and comfortable Breath sounds clear and equal Trace ankle edema Heart rate regular Neuro intact  Diagnostic Tests: CT scan shows the 8 cm paraganglioma tumor in the upper posterior mediastinum on the right side. This will require posterior lateral thoracotomy for resection. Risks of surgery are significant because of his underlying nonischemic cardiomyopathy-ejection fraction 15%. I've discussed the patient's upcoming surgery with his heart failure cardiologist and recommendation is to admit the patient preoperatively for intravenous milrinone, right heart cath and optimization of cardiac status prior to major thoracotomy and resection. 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

## 2014-01-20 ENCOUNTER — Ambulatory Visit (HOSPITAL_COMMUNITY)
Admission: RE | Admit: 2014-01-20 | Discharge: 2014-01-20 | Disposition: A | Discharge status: Home / Self Care | Payer: 59 | Source: Ambulatory Visit | Attending: Internal Medicine | Admitting: Internal Medicine

## 2014-01-20 ENCOUNTER — Encounter (HOSPITAL_COMMUNITY): Payer: Self-pay

## 2014-01-20 VITALS — BP 128/80 | HR 82 | Resp 18 | Wt 250.0 lb

## 2014-01-20 DIAGNOSIS — Z95810 Presence of automatic (implantable) cardiac defibrillator: Secondary | ICD-10-CM | POA: Diagnosis not present

## 2014-01-20 DIAGNOSIS — I2510 Atherosclerotic heart disease of native coronary artery without angina pectoris: Secondary | ICD-10-CM | POA: Insufficient documentation

## 2014-01-20 DIAGNOSIS — M109 Gout, unspecified: Secondary | ICD-10-CM | POA: Insufficient documentation

## 2014-01-20 DIAGNOSIS — I10 Essential (primary) hypertension: Secondary | ICD-10-CM

## 2014-01-20 DIAGNOSIS — I129 Hypertensive chronic kidney disease with stage 1 through stage 4 chronic kidney disease, or unspecified chronic kidney disease: Secondary | ICD-10-CM | POA: Insufficient documentation

## 2014-01-20 DIAGNOSIS — E039 Hypothyroidism, unspecified: Secondary | ICD-10-CM | POA: Insufficient documentation

## 2014-01-20 DIAGNOSIS — I472 Ventricular tachycardia: Secondary | ICD-10-CM | POA: Insufficient documentation

## 2014-01-20 DIAGNOSIS — C679 Malignant neoplasm of bladder, unspecified: Secondary | ICD-10-CM | POA: Insufficient documentation

## 2014-01-20 DIAGNOSIS — G4733 Obstructive sleep apnea (adult) (pediatric): Secondary | ICD-10-CM | POA: Diagnosis not present

## 2014-01-20 DIAGNOSIS — Z79899 Other long term (current) drug therapy: Secondary | ICD-10-CM | POA: Diagnosis not present

## 2014-01-20 DIAGNOSIS — R222 Localized swelling, mass and lump, trunk: Secondary | ICD-10-CM

## 2014-01-20 DIAGNOSIS — I429 Cardiomyopathy, unspecified: Secondary | ICD-10-CM | POA: Diagnosis not present

## 2014-01-20 DIAGNOSIS — D447 Neoplasm of uncertain behavior of aortic body and other paraganglia: Secondary | ICD-10-CM | POA: Insufficient documentation

## 2014-01-20 DIAGNOSIS — J9859 Other diseases of mediastinum, not elsewhere classified: Secondary | ICD-10-CM

## 2014-01-20 DIAGNOSIS — E875 Hyperkalemia: Secondary | ICD-10-CM | POA: Insufficient documentation

## 2014-01-20 DIAGNOSIS — E669 Obesity, unspecified: Secondary | ICD-10-CM | POA: Diagnosis not present

## 2014-01-20 DIAGNOSIS — I5022 Chronic systolic (congestive) heart failure: Secondary | ICD-10-CM

## 2014-01-20 DIAGNOSIS — M1000 Idiopathic gout, unspecified site: Secondary | ICD-10-CM

## 2014-01-20 DIAGNOSIS — N183 Chronic kidney disease, stage 3 (moderate): Secondary | ICD-10-CM | POA: Diagnosis not present

## 2014-01-20 LAB — PRO B NATRIURETIC PEPTIDE: PRO B NATRI PEPTIDE: 519.4 pg/mL — AB (ref 0–125)

## 2014-01-20 LAB — BASIC METABOLIC PANEL
Anion gap: 15 (ref 5–15)
BUN: 29 mg/dL — ABNORMAL HIGH (ref 6–23)
CHLORIDE: 102 meq/L (ref 96–112)
CO2: 22 meq/L (ref 19–32)
Calcium: 9.5 mg/dL (ref 8.4–10.5)
Creatinine, Ser: 1.51 mg/dL — ABNORMAL HIGH (ref 0.50–1.35)
GFR calc Af Amer: 59 mL/min — ABNORMAL LOW (ref >90)
GFR calc non Af Amer: 51 mL/min — ABNORMAL LOW (ref >90)
GLUCOSE: 111 mg/dL — AB (ref 70–99)
POTASSIUM: 4.7 meq/L (ref 3.7–5.3)
SODIUM: 139 meq/L (ref 137–147)

## 2014-01-20 LAB — MAGNESIUM: Magnesium: 1.6 mg/dL (ref 1.5–2.5)

## 2014-01-20 LAB — URIC ACID: URIC ACID, SERUM: 8.9 mg/dL — AB (ref 4.0–7.8)

## 2014-01-20 NOTE — Progress Notes (Signed)
Patient ID: [name], male   DOB: 1960/10/30, 53 y.o.   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   He is intolerant to numerous HF meds including spiro (severe hyperkalemia - requiring HD), hydralazine (fuzzy-headed) and imdur (severe HAs). S/P 12/17/12 bladder cancer removal.   11/29/11 ECHO EF 15% Diffuse hypokinesis. Features are consistent with a pseudonormal left ventricular filling pattern, with concomitant abnormal relaxation and increased filling pressure (grade 2 diastolic dysfunction).   Admitted to [facility] 01/26/2012 after his ICD fired 5 times Potassium >7.5 Required urgent dialysis. Discharged form [facility] 01/29/12. Discharge weight 250 pounds.   CPX 07/12/13 FVC 1.55 (37%)  FEV1 1.19 (36%)  FEV1/FVC 77%  MVV 53 (35%) Exercise Time: 3:30 Speed (mph): 1.5 Grade (%): 0  RPE: 15 Resting HR: 109 Peak HR: 141 (84% age predicted max HR) BP rest: 130/90 BP peak: 142/96 Peak VO2: 9.0 (34.7% predicted peak VO2)  VE/VCO2 slope: 32.0 OUES: 1.90 Peak RER: 0.90 Ventilatory Threshold: Could not be detected VE/MVV: 58.5% PETCO2 at peak: 33 O2pulse: 8 (44% predicted O2pulse)  ECHO 02/25/13 EF <15%   Admitted in July for [facility] procedure for biopsy of a right mid mediastinal mass. At the time of surgery the mass was quite vascular and and attempted wedge biopsy resulted in severe bleeding. Needle aspirate of the mass more posteriorly resulted in bleeding as well. Both sites were surgically closed. The mediastinal tissue on the surface of the mass was sent for pathology which was unremarkable. A postoperative CT scan with contrast showed no clear feeding  blood vessel to suggest this was a hemangioma or AV malformation. While in hospital treated with milrinone and renal function improved. 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   Post-op He did develop gout in his right knee which required a Depo-Medrol injection by his rheumatologist and uloric increased.   Follow up for Heart Failure: Doing well. Denies SOB, orthopnea, CP or PND. Wears CPAP at night. Able to go from the clinic to the garage with no issues. Can walk upstairs with minimal SOB. Weight at home 246-250 lbs. Appetite better. Following a low salt diet and drinking less than 2L a day.   Labs  12/12/12 dig level 0.9 12/18/12 Magnesium 1.1 K 4.0 Creatinine 1.5 01/02/13: Uric acid 12.9, K+ 3.9, Cr 1.37, AST 21, ALT 19, Mag 1.3 01/29/13 K 4.1 Creatinine 1.5 Magnesium 1.5 03/21/13 Magnesium 1.7 TSH 1.11--> synthorid increased to 100 mcg.  7/15: K 4, creatinine 1.35 8/15: digoxin 0.5 12/2013: K+ 4.4, creatinine 1.39  ROS: All systems negative except as listed in HPI, PMH and Problem List.  Past Medical History  Diagnosis Date  . Gout 08/03/12    PT C/O OF RIGHT KNEE PAIN AND SWELLING -STATES GOUT FLARE UP - ONGOING SINCE APRIL - BUT SWELLING W/IN LAST WEEK  . Cardiomyopathy     Idiopathic dilated;   Marland Kitchen Ventricular tachycardia     s/p ICD  . Sleep apnea     CPAP  . Systolic CHF     EF 67%  . CKD (chronic kidney disease)     stage III baseline Crt 1.8-2.0  . Biventricular ICD (implantable  cardiac defibrillator) Medtronic ]     DOI 2008/ upgrade 2010/ Gen Change 2014  . Automatic implantable cardioverter-defibrillator in situ   . CHF (congestive heart failure)   . Bladder tumor     PT HOSP AT [facility] 4/24 TO 4/26 2014 WITH UTI--AND FOUND TO HAVE BLADDER TUMOR-  . Arthritis     GOUT  . Hilar mass     Noted CT 2010  . HTN (hypertension)     Dr. Caryl [name] cardiologist  . Coronary artery disease   . Hypothyroidism      Current Outpatient Prescriptions  Medication Sig Dispense Refill  . atorvastatin (LIPITOR) 40 MG tablet Take 1 tablet (40 mg total) by mouth at bedtime. 30 tablet 2  . carvedilol (COREG) 25 MG tablet Take 1 tablet (25 mg total) by mouth 2 (two) times daily with a meal. 60 tablet 6  . colchicine 0.6 MG tablet Take 0.6 mg by mouth daily as needed (gout).    Marland Kitchen digoxin (LANOXIN) 0.25 MG tablet Take 0.125 mg by mouth daily.    . Febuxostat (ULORIC) 80 MG TABS Take 80 mg by mouth at bedtime.    Marland Kitchen levothyroxine (SYNTHROID, LEVOTHROID) 150 MCG tablet Take 150 mcg by mouth daily before breakfast.    . losartan (COZAAR) 25 MG tablet Take 1 tablet (25 mg total) by mouth daily. 30 tablet 3  . magnesium oxide (MAG-OX) 400 MG tablet Take 400 mg by mouth 2 (two) times daily.     Marland Kitchen OVER THE COUNTER MEDICATION Place 1 drop into both eyes daily as needed (red eyes). Eye drops    . torsemide (DEMADEX) 20 MG tablet Take 1 tablet (20 mg total) by mouth daily. 30 tablet 3   No current facility-administered medications for this encounter.     Filed Vitals:   01/20/14 1356  Pulse: 82  Resp: 18  Weight: 250 lb (113.399 kg)  SpO2: 100%    PHYSICAL EXAM: General:  Well appearing. No resp difficulty HEENT: normal Neck: supple. JVP 8 cm with HJR. Carotids 2+ bilaterally; no bruits. No lymphadenopathy or thryomegaly appreciated. R neck scar  Cor: PMI normal. Regular rate & rhythm. No rubs, gallops or murmurs. Lungs: clear Abdomen: obese, soft, nontender, nondistended. No hepatosplenomegaly. No bruits or masses. Good bowel sounds. Extremities: no cyanosis, clubbing, rash, edema Neuro: alert & orientedx3, cranial nerves grossly intact. Moves all 4 extremities w/o difficulty. Affect pleasant.   ASSESSMENT & PLAN:  1) Chronic systolic HF: NICM, EF 40% (02/2013) s/p Medtronic Optivol  ICD 04/2012 . - NYHA II-III symptoms and volume status stable. Will continue torsemide 20 mg daily. Discussed the use of  sliding scale diuretics. Check BMET and pro-BNP today.  - On goal dose coreg 25 mg BID.  - Discussed with the patient about starting corlanor 5 mg BID however he would like to hold off currently. He is willing to consider starting in the hospital when he [name] back for surgery if it is on formulary.  - Continue digoxin 0.125 mg daily and losartan 25 mg daily.  - He is intolerant to numerous HF meds including spiro (severe hyperkalemia - requiring HD), hydralazine (fuzzy-headed) and imdur (severe HAs). - Overall he has been stable from HF standpoint though his renal function did improve significantly with milrinone while in hospital and I suspect his cardiac output is marginal. His EF has been 10-15% since at least 2008 and in the past, we have discussed with him concern that he may need advanced therapies. He  is being treated for bladder cancer currently and would have to be in remission for at least 5 years to consider heart transplant. He could possibly need LVAD in the not so distant future. CPX 5/15 concerning but RER was submaximal. If he significantly worsens, will need RHC.  - Reinforced the need and importance of daily weights, a low sodium diet, and fluid restriction (less than 2 L a day). Instructed to call the HF clinic if weight increases more than 3 lbs overnight or 5 lbs in a week.  2) Right thoracic apex mass:  Paraganglioma.  Dr. Darcey Nora planning removal with posterolateral thoracotomy in in 12/15.  3) OSA: Continue CPAP nightly 4) Bladder Cancer: Continuing with BCG treatment 5) VT:  Quiescent. Followed by Dr. Caryl [name] 6) Gout: On chronic uloric and colchicine PRN. Complaining of some knee pain will check uric acid today.    F/U 2 months Rande Brunt NP-C 01/20/2014

## 2014-01-20 NOTE — Patient Instructions (Signed)
Doing well.  Will call about lab results.   Call any issues.  Follow up in 2 months  Have a wonderful Thanksgiving.   Do the following things EVERYDAY: 1) Weigh yourself in the morning before breakfast. Write it down and keep it in a log. 2) Take your medicines as prescribed 3) Eat low salt foods-Limit salt (sodium) to 2000 mg per day.  4) Stay as active as you can everyday 5) Limit all fluids for the day to less than 2 liters 6)

## 2014-01-22 ENCOUNTER — Ambulatory Visit: Payer: 59 | Admitting: Cardiothoracic Surgery

## 2014-01-24 ENCOUNTER — Telehealth (HOSPITAL_COMMUNITY): Payer: Self-pay | Admitting: Anesthesiology

## 2014-01-24 NOTE — Telephone Encounter (Signed)
Called to give results of labs. Patient appreciative.   Junie Bame B NP-C 12:27 PM

## 2014-01-25 ENCOUNTER — Other Ambulatory Visit: Payer: Self-pay | Admitting: Cardiothoracic Surgery

## 2014-01-29 ENCOUNTER — Other Ambulatory Visit (HOSPITAL_COMMUNITY): Payer: Self-pay | Admitting: *Deleted

## 2014-01-29 MED ORDER — LOSARTAN POTASSIUM 25 MG PO TABS: 25.0000 mg | ORAL_TABLET | Freq: Every day | ORAL | Status: DC

## 2014-02-05 ENCOUNTER — Encounter: Payer: Self-pay | Admitting: Cardiothoracic Surgery

## 2014-02-05 ENCOUNTER — Ambulatory Visit (INDEPENDENT_AMBULATORY_CARE_PROVIDER_SITE_OTHER): Payer: 59 | Admitting: Cardiothoracic Surgery

## 2014-02-05 VITALS — BP 136/90 | HR 100 | Ht 70.0 in | Wt 254.0 lb

## 2014-02-05 DIAGNOSIS — D447 Neoplasm of uncertain behavior of aortic body and other paraganglia: Secondary | ICD-10-CM

## 2014-02-05 DIAGNOSIS — R222 Localized swelling, mass and lump, trunk: Secondary | ICD-10-CM

## 2014-02-05 DIAGNOSIS — J9859 Other diseases of mediastinum, not elsewhere classified: Secondary | ICD-10-CM

## 2014-02-05 NOTE — Progress Notes (Signed)
PCP is Gennette Pac, MD Referring Provider is Collene Gobble, MD  Chief Complaint  Patient presents with  . Follow-up    Further discuss surgery scheduled for 02/10/14    [name] presents for further discussion regarding surgery to resect a large  8 cm paraganglioma from the apical portion of his right hemithorax. As proven by transthoracic biopsy. The tumor has gradually increased in size over the past 8 years and is now causing symptoms with swallowing difficulty. The  surgery has been scheduled for December 10 at [facility] hospital with preadmission for placement of a central line by IR  and milrinone because of his nonischemic cardiomyopathy with ejection fraction of 15%. Patient has fairly compensated heart failure but has class III  Symptoms with symptoms of bloating and shortness of breath. After a chamberlain  procedure to biopsy the mass this past summer  the patient developed acute renal failure because of his poor cardiac function. Only after milrinone was initiated for several days to his kidney function returned back to baseline.  The patient is a nonsmoker. Previous PFTs had been performed. FEV11.3, FVC 1.9, DLCO 54% He is currently not on any blood thinners. We'll plan on a preoperative epidural catheter placement by anesthesia for control of postoperative thoracotomy pain as the incision to resect the mass will be considerable.  The patient understands the reason to resect the mass because of extrinsic compression of the trachea and symptomatic problems with swallowing. He understands the risk of the surgery related to the actual procedure including bleeding and postoperative pleural effusion or other pulmonary problems related to his obesity and COPD. Also he understands the risks of surgery related to his poor cardiac function including risks of cardiogenic shock and death.  The patient was recently today by his renal physicians and his renal status is optimized. He was seen  at the end of November and the advanced heart care clinic in his cardiac function is optimized.  Past Medical History  Diagnosis Date  . Gout 08/03/12    PT C/O OF RIGHT KNEE PAIN AND SWELLING -STATES GOUT FLARE UP - ONGOING SINCE APRIL - BUT SWELLING W/IN LAST WEEK  . Cardiomyopathy     Idiopathic dilated;   Marland Kitchen Ventricular tachycardia     s/p ICD  . Sleep apnea     CPAP  . Systolic CHF     EF 71%  . CKD (chronic kidney disease)     stage III baseline Crt 1.8-2.0  . Biventricular ICD (implantable cardiac defibrillator) Medtronic ]     DOI 2008/ upgrade 2010/ Gen Change 2014  . Automatic implantable cardioverter-defibrillator in situ   . CHF (congestive heart failure)   . Bladder tumor     PT HOSP AT [facility] 4/24 TO 4/26 2014 WITH UTI--AND FOUND TO HAVE BLADDER TUMOR-  . Arthritis     GOUT  . Hilar mass     Noted CT 2010  . HTN (hypertension)     Dr. Caryl Comes cardiologist  . Coronary artery disease   . Hypothyroidism     Past Surgical History  Procedure Laterality Date  . Cholecystectomy    . Cardiac catheterization      he was found to have normal coronary arteries but with a globally dilated and hypocontractile heart  . US echocardiography  03-17-2008, 07-03-2006    EF 15-20%, EF 15-20%  . Transurethral resection of bladder tumor N/A 08/13/2012    Procedure: TRANSURETHRAL RESECTION OF BLADDER TUMOR (TURBT);  Surgeon: Thana Farr  Karsten Ro, MD;  Location: [facility];  Service: Urology;  Laterality: N/A;  MITOMYCIN C  . Icd insertion  04/2012-new ICD  . Endobronchial ultrasound Bilateral 10/01/2012    Procedure: ENDOBRONCHIAL ULTRASOUND;  Surgeon: Collene Gobble, MD;  Location: [facility] ENDOSCOPY;  Service: Cardiopulmonary;  Laterality: Bilateral;  . Cornea replacement    . Video bronchoscopy N/A 10/25/2012    Procedure: VIDEO BRONCHOSCOPY;  Surgeon: [name], MD;  Location: [place];  Service: Thoracic;  Laterality: N/A;  . Mediastinoscopy N/A 10/25/2012    Procedure: MEDIASTINOSCOPY;  Surgeon:  [name], MD;  Location: [place];  Service: Thoracic;  Laterality: N/A;  . Tonsillectomy    . Mediastinotomy  09/10/2013    paratracheal mass    DR Isaias Dowson  . Chest tube insertion  09/10/2013  . Mediastinotomy chamberlain [email or web address] Right 09/10/2013    Procedure: MEDIASTINOTOMY CHAMBERLAIN MCNEIL;  Surgeon: [name], MD;  Location: [place];  Service: Thoracic;  Laterality: Right;    No family history on file.  Social History History  Substance Use Topics  . Smoking status: Never Smoker   . Smokeless tobacco: Never Used  . Alcohol Use: No    Current Outpatient Prescriptions  Medication Sig Dispense Refill  . atorvastatin (LIPITOR) 40 MG tablet Take 1 tablet (40 mg total) by mouth at bedtime. 30 tablet 2  . carvedilol (COREG) 25 MG tablet Take 1 tablet (25 mg total) by mouth 2 (two) times daily with a meal. 60 tablet 6  . colchicine 0.6 MG tablet Take 0.6 mg by mouth daily as needed (gout).    Marland Kitchen digoxin (LANOXIN) 0.25 MG tablet Take 0.125 mg by mouth daily.    . Febuxostat (ULORIC) 80 MG TABS Take 80 mg by mouth at bedtime.    Marland Kitchen levothyroxine (SYNTHROID, LEVOTHROID) 150 MCG tablet Take 150 mcg by mouth daily before breakfast.    . losartan (COZAAR) 25 MG tablet Take 1 tablet (25 mg total) by mouth daily. 30 tablet 6  . magnesium oxide (MAG-OX) 400 MG tablet Take 400 mg by mouth 2 (two) times daily.     Marland Kitchen OVER THE COUNTER MEDICATION Place 1 drop into both eyes daily as needed (red eyes). Eye drops    . torsemide (DEMADEX) 20 MG tablet Take 1 tablet (20 mg total) by mouth daily. (Patient taking differently: Take 20 mg by mouth daily. 20 mg Mon/Wed/Fri's and 1/2 tab (10 mg) all the other days) 30 tablet 3   No current facility-administered medications for this visit.    Allergies  Allergen Reactions  . Bidil [Isosorb Dinitrate-Hydralazine]     Headaches   . Spironolactone     Hyperkalemia   . Hydralazine Hcl     Fuzzy headed    Review of Systemsstable weight no  fever no symptoms of upper respiratory infection, no recent symptoms of his gout  BP 136/90 mmHg  Pulse 100  Ht 5\' 10"  (1.778 m)  Wt 254 lb (115.214 kg)  BMI 36.45 kg/m2  SpO2 97% Physical Exam Alert and comfortable, obese middle-aged male HEENT right eye slightly injected with swollen eyelid-chronic Lungs with clear breath sounds, well-healed right anterior VATS incision Cardiac function regular rhythm withno murmur or gallop Abdomen soft obese-status post cholecystectomy Extremities-1+ pedal edema Neuro-intact   Diagnostic Tests: No chest x-ray today  Impression: 8 cm. Paragangliomaof right upper chest Plan: Admit for surgical resection plan for December 10 with preoperative preparation with IV milrinone to optimize cardiac function because of the ejection  fraction of 10-15%

## 2014-02-07 ENCOUNTER — Other Ambulatory Visit: Payer: Self-pay | Admitting: *Deleted

## 2014-02-10 ENCOUNTER — Encounter (HOSPITAL_COMMUNITY): Payer: Self-pay | Admitting: Cardiology

## 2014-02-10 ENCOUNTER — Inpatient Hospital Stay (HOSPITAL_COMMUNITY)
Admission: RE | Admit: 2014-02-10 | Discharge: 2014-02-23 | DRG: 163 | Disposition: A | Discharge status: Home Health | Payer: 59 | Source: Ambulatory Visit | Attending: Cardiothoracic Surgery | Admitting: Cardiothoracic Surgery

## 2014-02-10 ENCOUNTER — Inpatient Hospital Stay (HOSPITAL_COMMUNITY): Payer: 59

## 2014-02-10 DIAGNOSIS — I129 Hypertensive chronic kidney disease with stage 1 through stage 4 chronic kidney disease, or unspecified chronic kidney disease: Secondary | ICD-10-CM | POA: Diagnosis present

## 2014-02-10 DIAGNOSIS — Z95810 Presence of automatic (implantable) cardiac defibrillator: Secondary | ICD-10-CM

## 2014-02-10 DIAGNOSIS — I878 Other specified disorders of veins: Secondary | ICD-10-CM

## 2014-02-10 DIAGNOSIS — I509 Heart failure, unspecified: Secondary | ICD-10-CM

## 2014-02-10 DIAGNOSIS — R1310 Dysphagia, unspecified: Secondary | ICD-10-CM | POA: Diagnosis present

## 2014-02-10 DIAGNOSIS — I429 Cardiomyopathy, unspecified: Secondary | ICD-10-CM | POA: Diagnosis present

## 2014-02-10 DIAGNOSIS — Y658 Other specified misadventures during surgical and medical care: Secondary | ICD-10-CM | POA: Diagnosis not present

## 2014-02-10 DIAGNOSIS — I2510 Atherosclerotic heart disease of native coronary artery without angina pectoris: Secondary | ICD-10-CM | POA: Diagnosis present

## 2014-02-10 DIAGNOSIS — Z6837 Body mass index (BMI) 37.0-37.9, adult: Secondary | ICD-10-CM | POA: Diagnosis not present

## 2014-02-10 DIAGNOSIS — D751 Secondary polycythemia: Secondary | ICD-10-CM | POA: Diagnosis present

## 2014-02-10 DIAGNOSIS — D45 Polycythemia vera: Secondary | ICD-10-CM | POA: Diagnosis present

## 2014-02-10 DIAGNOSIS — D383 Neoplasm of uncertain behavior of mediastinum: Secondary | ICD-10-CM | POA: Diagnosis present

## 2014-02-10 DIAGNOSIS — J939 Pneumothorax, unspecified: Secondary | ICD-10-CM | POA: Insufficient documentation

## 2014-02-10 DIAGNOSIS — J9811 Atelectasis: Secondary | ICD-10-CM | POA: Diagnosis not present

## 2014-02-10 DIAGNOSIS — D3A8 Other benign neuroendocrine tumors: Secondary | ICD-10-CM | POA: Diagnosis present

## 2014-02-10 DIAGNOSIS — D447 Neoplasm of uncertain behavior of aortic body and other paraganglia: Secondary | ICD-10-CM | POA: Diagnosis present

## 2014-02-10 DIAGNOSIS — M109 Gout, unspecified: Secondary | ICD-10-CM | POA: Diagnosis present

## 2014-02-10 DIAGNOSIS — R0602 Shortness of breath: Secondary | ICD-10-CM

## 2014-02-10 DIAGNOSIS — Z9689 Presence of other specified functional implants: Secondary | ICD-10-CM | POA: Insufficient documentation

## 2014-02-10 DIAGNOSIS — D649 Anemia, unspecified: Secondary | ICD-10-CM | POA: Diagnosis not present

## 2014-02-10 DIAGNOSIS — E039 Hypothyroidism, unspecified: Secondary | ICD-10-CM | POA: Diagnosis present

## 2014-02-10 DIAGNOSIS — Z8551 Personal history of malignant neoplasm of bladder: Secondary | ICD-10-CM

## 2014-02-10 DIAGNOSIS — E669 Obesity, unspecified: Secondary | ICD-10-CM | POA: Diagnosis present

## 2014-02-10 DIAGNOSIS — N183 Chronic kidney disease, stage 3 unspecified: Secondary | ICD-10-CM

## 2014-02-10 DIAGNOSIS — I5023 Acute on chronic systolic (congestive) heart failure: Secondary | ICD-10-CM | POA: Diagnosis present

## 2014-02-10 DIAGNOSIS — J9571 Accidental puncture and laceration of a respiratory system organ or structure during a respiratory system procedure: Secondary | ICD-10-CM | POA: Diagnosis not present

## 2014-02-10 DIAGNOSIS — I428 Other cardiomyopathies: Secondary | ICD-10-CM | POA: Diagnosis present

## 2014-02-10 DIAGNOSIS — J449 Chronic obstructive pulmonary disease, unspecified: Secondary | ICD-10-CM | POA: Diagnosis present

## 2014-02-10 DIAGNOSIS — G4733 Obstructive sleep apnea (adult) (pediatric): Secondary | ICD-10-CM | POA: Diagnosis present

## 2014-02-10 DIAGNOSIS — Z01811 Encounter for preprocedural respiratory examination: Secondary | ICD-10-CM

## 2014-02-10 DIAGNOSIS — R51 Headache: Secondary | ICD-10-CM | POA: Diagnosis not present

## 2014-02-10 DIAGNOSIS — N179 Acute kidney failure, unspecified: Secondary | ICD-10-CM | POA: Diagnosis not present

## 2014-02-10 HISTORY — DX: Neoplasm of uncertain behavior of aortic body and other paraganglia: D44.7

## 2014-02-10 LAB — URINALYSIS, ROUTINE W REFLEX MICROSCOPIC
Bilirubin Urine: NEGATIVE
GLUCOSE, UA: NEGATIVE mg/dL
Ketones, ur: NEGATIVE mg/dL
Leukocytes, UA: NEGATIVE
Nitrite: NEGATIVE
Protein, ur: 100 mg/dL — AB
SPECIFIC GRAVITY, URINE: 1.013 (ref 1.005–1.030)
Urobilinogen, UA: 0.2 mg/dL (ref 0.0–1.0)
pH: 5.5 (ref 5.0–8.0)

## 2014-02-10 LAB — COMPREHENSIVE METABOLIC PANEL
ALK PHOS: 75 U/L (ref 39–117)
ALT: 22 U/L (ref 0–53)
ANION GAP: 15 (ref 5–15)
AST: 19 U/L (ref 0–37)
Albumin: 3.7 g/dL (ref 3.5–5.2)
BUN: 21 mg/dL (ref 6–23)
CO2: 24 meq/L (ref 19–32)
Calcium: 9.9 mg/dL (ref 8.4–10.5)
Chloride: 98 mEq/L (ref 96–112)
Creatinine, Ser: 1.33 mg/dL (ref 0.50–1.35)
GFR calc non Af Amer: 59 mL/min — ABNORMAL LOW (ref >90)
GFR, EST AFRICAN AMERICAN: 69 mL/min — AB (ref >90)
Glucose, Bld: 133 mg/dL — ABNORMAL HIGH (ref 70–99)
POTASSIUM: 4.5 meq/L (ref 3.7–5.3)
SODIUM: 137 meq/L (ref 137–147)
Total Bilirubin: 0.7 mg/dL (ref 0.3–1.2)
Total Protein: 8.1 g/dL (ref 6.0–8.3)

## 2014-02-10 LAB — CBC
HEMATOCRIT: 50.6 % (ref 39.0–52.0)
Hemoglobin: 17.4 g/dL — ABNORMAL HIGH (ref 13.0–17.0)
MCH: 28.8 pg (ref 26.0–34.0)
MCHC: 34.4 g/dL (ref 30.0–36.0)
MCV: 83.6 fL (ref 78.0–100.0)
Platelets: 160 10*3/uL (ref 150–400)
RBC: 6.05 MIL/uL — AB (ref 4.22–5.81)
RDW: 14.9 % (ref 11.5–15.5)
WBC: 9.6 10*3/uL (ref 4.0–10.5)

## 2014-02-10 LAB — PRO B NATRIURETIC PEPTIDE: Pro B Natriuretic peptide (BNP): 653.7 pg/mL — ABNORMAL HIGH (ref 0–125)

## 2014-02-10 LAB — PROTIME-INR
INR: 1.04 (ref 0.00–1.49)
Prothrombin Time: 13.7 seconds (ref 11.6–15.2)

## 2014-02-10 LAB — MAGNESIUM: MAGNESIUM: 1.6 mg/dL (ref 1.5–2.5)

## 2014-02-10 LAB — URINE MICROSCOPIC-ADD ON

## 2014-02-10 LAB — TSH: TSH: 1.68 u[IU]/mL (ref 0.350–4.500)

## 2014-02-10 MED ORDER — MILRINONE IN DEXTROSE 20 MG/100ML IV SOLN
0.3750 ug/kg/min | INTRAVENOUS | Status: DC
  Administered 2014-02-10 – 2014-02-11 (×2): 0.375 ug/kg/min via INTRAVENOUS
  Filled 2014-02-10 (×3): qty 100

## 2014-02-10 MED ORDER — ENOXAPARIN SODIUM 30 MG/0.3ML ~~LOC~~ SOLN
30.0000 mg | SUBCUTANEOUS | Status: DC
  Filled 2014-02-10: qty 0.3

## 2014-02-10 MED ORDER — MAGNESIUM OXIDE 400 (241.3 MG) MG PO TABS
400.0000 mg | ORAL_TABLET | Freq: Two times a day (BID) | ORAL | Status: DC
  Administered 2014-02-10 – 2014-02-12 (×5): 400 mg via ORAL
  Filled 2014-02-10 (×7): qty 1

## 2014-02-10 MED ORDER — SODIUM CHLORIDE 0.9 % IJ SOLN
3.0000 mL | Freq: Two times a day (BID) | INTRAMUSCULAR | Status: DC
  Administered 2014-02-12: 3 mL via INTRAVENOUS

## 2014-02-10 MED ORDER — CARVEDILOL 25 MG PO TABS
25.0000 mg | ORAL_TABLET | Freq: Two times a day (BID) | ORAL | Status: DC
  Administered 2014-02-10: 25 mg via ORAL
  Filled 2014-02-10 (×4): qty 1

## 2014-02-10 MED ORDER — ONDANSETRON HCL 4 MG PO TABS: 4.0000 mg | ORAL_TABLET | Freq: Four times a day (QID) | ORAL | Status: DC | PRN

## 2014-02-10 MED ORDER — TORSEMIDE 10 MG PO TABS
10.0000 mg | ORAL_TABLET | ORAL | Status: DC
  Administered 2014-02-11: 10 mg via ORAL
  Filled 2014-02-10: qty 1

## 2014-02-10 MED ORDER — ACETAMINOPHEN 325 MG PO TABS
650.0000 mg | ORAL_TABLET | Freq: Four times a day (QID) | ORAL | Status: DC | PRN
  Administered 2014-02-10: 650 mg via ORAL
  Filled 2014-02-10: qty 2

## 2014-02-10 MED ORDER — DOCUSATE SODIUM 100 MG PO CAPS
100.0000 mg | ORAL_CAPSULE | Freq: Every day | ORAL | Status: DC
  Administered 2014-02-10 – 2014-02-11 (×2): 100 mg via ORAL
  Filled 2014-02-10 (×4): qty 1

## 2014-02-10 MED ORDER — SODIUM CHLORIDE 0.9 % IJ SOLN: 3.0000 mL | INTRAMUSCULAR | Status: DC | PRN

## 2014-02-10 MED ORDER — TORSEMIDE 20 MG PO TABS
20.0000 mg | ORAL_TABLET | ORAL | Status: DC
  Administered 2014-02-10: 20 mg via ORAL
  Filled 2014-02-10: qty 1

## 2014-02-10 MED ORDER — FEBUXOSTAT 40 MG PO TABS
80.0000 mg | ORAL_TABLET | Freq: Every day | ORAL | Status: DC
  Administered 2014-02-10 – 2014-02-12 (×3): 80 mg via ORAL
  Filled 2014-02-10 (×4): qty 2

## 2014-02-10 MED ORDER — DIGOXIN 125 MCG PO TABS
0.1250 mg | ORAL_TABLET | Freq: Every day | ORAL | Status: DC
  Administered 2014-02-11 – 2014-02-15 (×4): 0.125 mg via ORAL
  Filled 2014-02-10 (×7): qty 1

## 2014-02-10 MED ORDER — TORSEMIDE 10 MG PO TABS: 10.0000 mg | ORAL_TABLET | Freq: Every day | ORAL | Status: DC

## 2014-02-10 MED ORDER — ATORVASTATIN CALCIUM 40 MG PO TABS
40.0000 mg | ORAL_TABLET | Freq: Every day | ORAL | Status: DC
  Administered 2014-02-10 – 2014-02-22 (×13): 40 mg via ORAL
  Filled 2014-02-10 (×15): qty 1

## 2014-02-10 MED ORDER — TORSEMIDE 20 MG PO TABS: 20.0000 mg | ORAL_TABLET | ORAL | Status: DC

## 2014-02-10 MED ORDER — HYDROCODONE-ACETAMINOPHEN 5-325 MG PO TABS: 1.0000 | ORAL_TABLET | ORAL | Status: DC | PRN

## 2014-02-10 MED ORDER — ACETAMINOPHEN 650 MG RE SUPP: 650.0000 mg | Freq: Four times a day (QID) | RECTAL | Status: DC | PRN

## 2014-02-10 MED ORDER — SODIUM CHLORIDE 0.9 % IV SOLN: 250.0000 mL | INTRAVENOUS | Status: DC | PRN

## 2014-02-10 MED ORDER — LEVOTHYROXINE SODIUM 150 MCG PO TABS
150.0000 ug | ORAL_TABLET | Freq: Every day | ORAL | Status: DC
  Administered 2014-02-11 – 2014-02-23 (×12): 150 ug via ORAL
  Filled 2014-02-10 (×16): qty 1

## 2014-02-10 MED ORDER — ONDANSETRON HCL 4 MG/2ML IJ SOLN: 4.0000 mg | Freq: Four times a day (QID) | INTRAMUSCULAR | Status: DC | PRN

## 2014-02-10 NOTE — H&P (Signed)
[address]       [place] [address]             [phone]        [name] [place] Medical Record #[id number] Date of Birth: 06/26/1960  Cardiologist: Dr. Caryl Comes Primary Care: Dr. Rex Kras Nephrologist: Dr. Marval Regal Urologist: Dr. 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 He had a mediastinal biopsy in September of this year which confirmed the neuroendocrine tumor. The tumor has gradually increased in size over the past 8 years and is now causing symptoms with swallowing difficulty. The surgery has been scheduled for December 10th  at [facility]. The patient is going to be preadmitted for placement of a central line by IRand milrinone because of his nonischemic cardiomyopathy (LVEF of 15%). The patient has fairly compensated heart failure, but has class IIIsymptoms ( bloating and shortness of breath). After a Chamberlainprocedure to biopsy the mass this past summer, the patient developed acute renal failure because of his poor cardiac function. Only after milrinone was initiated for several days did his kidney function returned back to baseline.  The patient is a nonsmoker. Previous PFTs had been performed. Results showed: FEV11.3, FVC 1.9, DLCO 54% He is currently not on any blood thinners. We'll plan on a preoperative epidural catheter placement by anesthesia for control of postoperative thoracotomy pain as the incision to resect the mass, which will be considerable.  The patient understands the reason to resect the mass because of extrinsic compression of the trachea and symptomatic problems with swallowing. He understands the risk of the surgery related to the actual procedure including bleeding and postoperative pleural effusion or other pulmonary problems  related to his obesity and COPD. Also, he understands the risks of surgery related to his poor cardiac function including risks of cardiogenic shock and death.  The patient was recently evaluated by his renal physicians and his renal status is optimized. He was seen at the end of November in the advanced heart care clinic and his cardiac function is optimized.  Current Activity/ Functional Status: Patient is independent with mobility/ambulation, transfers, ADL's, IADL's.   Zubrod Score: At the time of surgery this patient's most appropriate activity status/level should be described as: [x]     0    Normal activity, no symptoms []     1    Restricted in physical strenuous activity but ambulatory, able to do out light work []     2    Ambulatory and capable of self care, unable to do work activities, up and about                 more than 50%  Of the time                            []     3    Only limited self care, in bed greater than 50% of waking hours []     4    Completely disabled, no self care, confined to bed or chair []     5    Moribund  Past Medical History  Diagnosis Date  . Gout 08/03/12    PT C/O OF RIGHT KNEE PAIN AND SWELLING -STATES GOUT FLARE UP -  ONGOING SINCE APRIL - BUT SWELLING W/IN LAST WEEK  . Cardiomyopathy     Idiopathic dilated;   Marland Kitchen Ventricular tachycardia     s/p ICD  . Sleep apnea     CPAP  . Systolic CHF     EF 66%  . CKD (chronic kidney disease)     stage III baseline Crt 1.8-2.0  . Biventricular ICD (implantable cardiac defibrillator) Medtronic ]     DOI 2008/ upgrade 2010/ Gen Change 2014  . Automatic implantable cardioverter-defibrillator in situ   . CHF (congestive heart failure)   . Bladder tumor     PT HOSP AT [facility] 4/24 TO 4/26 2014 WITH UTI--AND FOUND TO HAVE BLADDER TUMOR-  . Arthritis     GOUT  . Hilar mass     Noted CT 2010  . HTN (hypertension)     Dr. Caryl Comes cardiologist  . Coronary artery disease   . Hypothyroidism     Past Surgical  History  Procedure Laterality Date  . Cholecystectomy    . Cardiac catheterization      he was found to have normal coronary arteries but with a globally dilated and hypocontractile heart  . US echocardiography  03-17-2008, 07-03-2006    EF 15-20%, EF 15-20%  . Transurethral resection of bladder tumor N/A 08/13/2012    Procedure: TRANSURETHRAL RESECTION OF BLADDER TUMOR (TURBT);  Surgeon: Claybon Jabs, MD;  Location: [facility];  Service: Urology;  Laterality: N/A;  MITOMYCIN C  . Icd insertion  04/2012-new ICD  . Endobronchial ultrasound Bilateral 10/01/2012    Procedure: ENDOBRONCHIAL ULTRASOUND;  Surgeon: Collene Gobble, MD;  Location: [facility];  Service: Cardiopulmonary;  Laterality: Bilateral;  . Cornea replacement    . Video bronchoscopy N/A 10/25/2012    Procedure: VIDEO BRONCHOSCOPY;  Surgeon: [name], MD;  Location: [place];  Service: Thoracic;  Laterality: N/A;  . Mediastinoscopy N/A 10/25/2012    Procedure: MEDIASTINOSCOPY;  Surgeon: [name], MD;  Location: [place];  Service: Thoracic;  Laterality: N/A;  . Tonsillectomy    . Mediastinotomy  09/10/2013    paratracheal mass    DR VANTRIGT  . Chest tube insertion  09/10/2013  . Mediastinotomy chamberlain mcneil Right 09/10/2013    Procedure: MEDIASTINOTOMY CHAMBERLAIN MCNEIL;  Surgeon: [name], MD;  Location: [place];  Service: Thoracic;  Laterality: Right;      History   Social History  . Marital Status: Married    Spouse Name: N/A    Number of Children: 2  . Years of Education: N/A   Occupational History  . [employer]   Social History Main Topics  . Smoking status: Never Smoker   . Smokeless tobacco: Never Used  . Alcohol Use: No  . Drug Use: No  . Sexual Activity: No   Family History: Patient was adopted. Biological mother is alive and has gout.  Allergies  Allergen Reactions  . Bidil [Isosorb Dinitrate-Hydralazine]     Headaches   . Spironolactone     Hyperkalemia   .  Hydralazine Hcl     Fuzzy headed    No current facility-administered medications for this encounter.   Current Outpatient Prescriptions  Medication Sig Dispense Refill  . atorvastatin (LIPITOR) 40 MG tablet Take 1 tablet (40 mg total) by mouth at bedtime. 30 tablet 2  . carvedilol (COREG) 25 MG tablet Take 1 tablet (25 mg total) by mouth 2 (two) times daily with a meal. 60 tablet 6  .  colchicine 0.6 MG tablet Take 0.6 mg by mouth daily as needed (gout).    Marland Kitchen digoxin (LANOXIN) 0.25 MG tablet Take 0.125 mg by mouth daily.    . Febuxostat (ULORIC) 80 MG TABS Take 80 mg by mouth at bedtime.    Marland Kitchen levothyroxine (SYNTHROID, LEVOTHROID) 150 MCG tablet Take 150 mcg by mouth daily before breakfast.    . losartan (COZAAR) 25 MG tablet Take 1 tablet (25 mg total) by mouth daily. 30 tablet 6  . magnesium oxide (MAG-OX) 400 MG tablet Take 400 mg by mouth 2 (two) times daily.     Marland Kitchen OVER THE COUNTER MEDICATION Place 1 drop into both eyes daily as needed (red eyes). Eye drops    . torsemide (DEMADEX) 20 MG tablet Take 1 tablet (20 mg total) by mouth daily. (Patient taking differently: Take 20 mg by mouth daily. 20 mg Mon/Wed/Fri's and 1/2 tab (10 mg) all the other days) 30 tablet 3    Review of Systems:     Cardiac Review of Systems: Y or N  Chest Pain [  N  ]  Resting SOB [ N  ] Exertional SOB  [ Y ]  Orthopnea [ N ]   Pedal Edema [ N  ]    Palpitations [ N ] Syncope  [ N ]   Presyncope [  N ]  General Review of Systems: [Y] = yes [  ]=no Constitional: recent weight change Aqua.Slicker  ]; anorexia [  N]; fatigue [Y  ]; nausea Aqua.Slicker  ]; night sweats Aqua.Slicker  ]; fever [  N]; or chills [ N ]                                               Eye : blurred vision [ N ]; diplopia [ N  ]; vision changes [ N ];  Amaurosis fugax[ N ]; Resp: cough [ N ];  wheezing[ N ];  hemoptysis[ N ]; shortness of breath[ Y ];   GI:  gallstones[ N ], vomiting[ N ];  dysphagia[ Y ]; melena[ N ];  hematochezia [ N ]; heartburn[N  ];    GU: kidney  stones [ N ];   history of tumor [Y]             Skin: rash, swelling[  N ];, hair loss[ N ]; or itching[ N ]; Musculosketetal: myalgias[N   ];  joint swelling[ N ];  joint erythema[N  ];  joint pain[N  ];  back pain[N  ];  Heme/Lymph: bruising[ N ];  bleeding[ N ];  anemia[ N ];  Neuro: TIA[  N];  headaches[  N];  stroke[ N ];  vertigo[ N ];  seizures[N  ];   paresthesias[ N ];  difficulty walking[ N ];  Psych:depression[ N ]; anxiety[N  ];  Endocrine: diabetes[ N ];  thyroid dysfunction[ Y]   Physical Exam: There were no vitals taken for this visit.  General appearance: alert, cooperative and no distress Heart: regular rate and rhythm, S1, S2 normal, no murmur, click, rub or gallop Lungs: clear to auscultation bilaterally. Well healed right VATS incision. Abdomen: soft, non-tender; bowel sounds normal; no masses,  no organomegaly Extremities: Trace lower extremity edema. Palpable DP/PT bilaterally HEENT: Head-atraumatic, normocephalic; Eyes- EOMI, sclera non icteric. Right eye is red, swollen (chronic) Neck- supple, no JVD Neurologic: intact  Recent Radiology Findings:  CLINICAL DATA: Thoracic paraganglioma/neuroendocrine tumor. History of bladder cancer. Mediastinal mass.  EXAM: CT CHEST WITHOUT CONTRAST  TECHNIQUE: Multidetector CT imaging of the chest was performed following the standard protocol without IV contrast.  COMPARISON: 09/17/2013  FINDINGS: The right paratracheal mass measures 8.4 by 7.1 by 7.3 cm (formerly 8.4 by 7.3 by 7.3 cm by my measurements). Faint noncontrast heterogeneity of the lesion. The trachea is slightly narrowed adjacent to the mass due to mass effect. Cross-sectional area of the trachea in this vicinity is approximately 1.8 Cm^2. No overt mass effect on the SVC.  There is moderate cardiomegaly. Dual lead pacer observed. The right IJ line has been removed.  Scattered calcified granulomas noted in the lungs. Prior pleural effusions  and prior gas along the subcutaneous tissues and pectoralis muscles resolved. Right second rib osteotomy noted.  Thoracic spondylosis.  IMPRESSION: 1. Stable size of the a right eccentric mediastinal tumor. 2. The right pleural effusion has resolved. 3. Stable cardiomegaly.   Electronically Signed  By: Sherryl Barters M.D.  On: 01/15/2014 11:24    Recent Lab Findings: Lab Results  Component Value Date   WBC 9.0 11/05/2013   HGB 16.3 11/05/2013   HCT 47.6 11/05/2013   PLT 209 11/05/2013   GLUCOSE 111* 01/20/2014   CHOL  12/11/2006    123        ATP III CLASSIFICATION:  <200     mg/dL   Desirable  200-239  mg/dL   Borderline High  >=240    mg/dL   High   TRIG 140 12/11/2006   HDL 20* 12/11/2006   LDLCALC  12/11/2006    75        Total Cholesterol/HDL:CHD Risk Coronary Heart Disease Risk Table                     Men   Women  1/2 Average Risk   3.4   3.3   ALT 47 09/18/2013   AST 51* 09/18/2013   NA 139 01/20/2014   K 4.7 01/20/2014   CL 102 01/20/2014   CREATININE 1.51* 01/20/2014   BUN 29* 01/20/2014   CO2 22 01/20/2014   TSH 0.913 09/17/2013   INR 1.15 11/05/2013   HGBA1C 6.3* 06/28/2012    Assessment / Plan:    1. Paraganglioma/neuroendocrine tumor of the right hemothorax. Will undergo right VATS, thoracotomy, resection of mediastinal mass with Dr. Prescott Gum on Thursday 09/13/2013. 2. History of non ischemic cardiomyopathy (LVEF 15%). He will be placed on a Milrinone drip and heart failure will evaluate him on the am. 3. Chronic systolic CHF 4. History of OSA (on CPAP) 5. History of CKD (stage III, baseline creatinine 1.8-2). I have contacted Dr. Joelyn Oms who will evaluate the patient in the am. 6. History of hypertension 7. History of hypothyroidism 8. History of CAD 9. History of paroxysmal VT (s/p AICD) 10. History of bladder tumor 11. History of gout  Lars Pinks PA-C  02/10/2014 2:50 PM   patient examined and medical record  reviewed,agree with above note. Plan resection of R chest tumor later this hospitalization after he is optimized w/r to renal fx and cardiac fx [name],PETER 02/12/2014

## 2014-02-10 NOTE — Progress Notes (Signed)
Patient states his magnesium would be low and that he wanted to get his magnesium 400mg  BID ordered; notified Lars Pinks, PA; ordered per verbal read back.  Rowe Pavy, RN

## 2014-02-11 ENCOUNTER — Encounter (HOSPITAL_COMMUNITY): Payer: Self-pay | Admitting: General Practice

## 2014-02-11 ENCOUNTER — Inpatient Hospital Stay (HOSPITAL_COMMUNITY): Payer: 59

## 2014-02-11 DIAGNOSIS — N183 Chronic kidney disease, stage 3 (moderate): Secondary | ICD-10-CM

## 2014-02-11 DIAGNOSIS — I5022 Chronic systolic (congestive) heart failure: Secondary | ICD-10-CM

## 2014-02-11 DIAGNOSIS — N179 Acute kidney failure, unspecified: Secondary | ICD-10-CM

## 2014-02-11 DIAGNOSIS — I517 Cardiomegaly: Secondary | ICD-10-CM

## 2014-02-11 DIAGNOSIS — M79609 Pain in unspecified limb: Secondary | ICD-10-CM

## 2014-02-11 LAB — BASIC METABOLIC PANEL
ANION GAP: 16 — AB (ref 5–15)
BUN: 26 mg/dL — ABNORMAL HIGH (ref 6–23)
CHLORIDE: 98 meq/L (ref 96–112)
CO2: 22 meq/L (ref 19–32)
Calcium: 9.7 mg/dL (ref 8.4–10.5)
Creatinine, Ser: 1.75 mg/dL — ABNORMAL HIGH (ref 0.50–1.35)
GFR calc Af Amer: 49 mL/min — ABNORMAL LOW (ref >90)
GFR calc non Af Amer: 42 mL/min — ABNORMAL LOW (ref >90)
Glucose, Bld: 135 mg/dL — ABNORMAL HIGH (ref 70–99)
POTASSIUM: 4.3 meq/L (ref 3.7–5.3)
SODIUM: 136 meq/L — AB (ref 137–147)

## 2014-02-11 MED ORDER — MIDAZOLAM HCL 2 MG/2ML IJ SOLN
INTRAMUSCULAR | Status: AC | PRN
  Administered 2014-02-11: 1 mg via INTRAVENOUS

## 2014-02-11 MED ORDER — MAGNESIUM SULFATE 4 GM/100ML IV SOLN
4.0000 g | Freq: Once | INTRAVENOUS | Status: AC
  Administered 2014-02-11: 4 g via INTRAVENOUS
  Filled 2014-02-11: qty 100

## 2014-02-11 MED ORDER — SODIUM CHLORIDE 0.9 % IJ SOLN
10.0000 mL | INTRAMUSCULAR | Status: DC | PRN
  Administered 2014-02-11 – 2014-02-12 (×2): 10 mL
  Filled 2014-02-11: qty 40

## 2014-02-11 MED ORDER — FENTANYL CITRATE 0.05 MG/ML IJ SOLN
INTRAMUSCULAR | Status: AC | PRN
  Administered 2014-02-11: 50 ug via INTRAVENOUS

## 2014-02-11 MED ORDER — MILRINONE IN DEXTROSE 20 MG/100ML IV SOLN
0.2500 ug/kg/min | INTRAVENOUS | Status: DC
Start: 2014-02-11 — End: 2014-02-19
  Administered 2014-02-11 – 2014-02-18 (×16): 0.25 ug/kg/min via INTRAVENOUS
  Filled 2014-02-11 (×18): qty 100

## 2014-02-11 MED ORDER — CARVEDILOL 6.25 MG PO TABS
6.2500 mg | ORAL_TABLET | Freq: Two times a day (BID) | ORAL | Status: DC
  Administered 2014-02-12 (×2): 6.25 mg via ORAL
  Filled 2014-02-11 (×5): qty 1

## 2014-02-11 MED ORDER — TOBRAMYCIN-DEXAMETHASONE 0.3-0.1 % OP SUSP
2.0000 [drp] | Freq: Two times a day (BID) | OPHTHALMIC | Status: DC
  Administered 2014-02-11 – 2014-02-23 (×24): 2 [drp] via OPHTHALMIC
  Filled 2014-02-11 (×2): qty 2.5

## 2014-02-11 MED ORDER — HEPARIN SODIUM (PORCINE) 5000 UNIT/ML IJ SOLN
5000.0000 [IU] | Freq: Three times a day (TID) | INTRAMUSCULAR | Status: DC
  Administered 2014-02-11 – 2014-02-12 (×3): 5000 [IU] via SUBCUTANEOUS
  Filled 2014-02-11 (×5): qty 1

## 2014-02-11 MED ORDER — SODIUM CHLORIDE 0.9 % IV BOLUS (SEPSIS)
150.0000 mL | Freq: Once | INTRAVENOUS | Status: AC
  Administered 2014-02-11: 150 mL via INTRAVENOUS

## 2014-02-11 MED ORDER — MIDAZOLAM HCL 2 MG/2ML IJ SOLN
INTRAMUSCULAR | Status: AC
  Filled 2014-02-11: qty 2

## 2014-02-11 MED ORDER — FENTANYL CITRATE 0.05 MG/ML IJ SOLN
INTRAMUSCULAR | Status: AC
  Filled 2014-02-11: qty 2

## 2014-02-11 MED ORDER — CARVEDILOL 12.5 MG PO TABS
12.5000 mg | ORAL_TABLET | Freq: Two times a day (BID) | ORAL | Status: DC
  Filled 2014-02-11: qty 1

## 2014-02-11 NOTE — Progress Notes (Addendum)
      [address]       [place] [address]             [phone]          Procedure(s) (LRB): VIDEO ASSISTED THORACOSCOPY (VATS)/THOROCOTOMY (Right) RESECTION OF MEDIASTINAL MASS (Right)  Subjective: Patient with headache  Objective: Vital signs in last 24 hours: Temp:  [97.6 F (36.4 C)-98.8 F (37.1 C)] 97.6 F (36.4 C) (12/08 0422) Pulse Rate:  [81-101] 81 (12/08 0648) Cardiac Rhythm:  [-] Ventricular paced (12/08 0000) Resp:  [18-22] 22 (12/08 0422) BP: (97-134)/(49-91) 97/49 mmHg (12/08 0648) SpO2:  [96 %-99 %] 96 % (12/08 0422) Weight:  [253 lb 1.4 oz (114.8 kg)-257 lb 6.4 oz (116.756 kg)] 253 lb 1.4 oz (114.8 kg) (12/08 0422)   Current Weight  02/11/14 253 lb 1.4 oz (114.8 kg)      Intake/Output from previous day: 12/07 0701 - 12/08 0700 In: 120 [P.O.:120] Out: -    Physical Exam:  Cardiovascular: Paced this am Pulmonary: Clear to auscultation bilaterally; no rales, wheezes, or rhonchi. Abdomen: Soft, non tender, bowel sounds present. Extremities: Trace bilateral lower extremity edema.   Lab Results: CBC: Recent Labs  02/10/14 1755  WBC 9.6  HGB 17.4*  HCT 50.6  PLT 160   BMET:  Recent Labs  02/10/14 1755  NA 137  K 4.5  CL 98  CO2 24  GLUCOSE 133*  BUN 21  CREATININE 1.33  CALCIUM 9.9    PT/INR:  Lab Results  Component Value Date   INR 1.04 02/10/2014   INR 1.15 11/05/2013   INR 1.21 09/10/2013   ABG:  INR: Will add last result for INR, ABG once components are confirmed Will add last 4 CBG results once components are confirmed  Assessment/Plan:  1. CV - Paced in the 80's this am. BP labile. On Digoxin, Coreg, and Milrinone drip. Will hold Coreg this am. 2. History of CHF- BNP 653.7. On Demadex as taken at home. Await heart failure evaluation. 3. To IR for tunnel catheter this am 4. Headache-tylenol PRN. Could be related to Milrinone  ZIMMERMAN,DONIELLE MPA-C 02/11/2014,7:32 AM  Heart Failure team  to see Complaining of head ache, ? Related to milrinone Surgery Thursday per Dr Darcey Nora  I have seen and examined [name] and agree with the above assessment  and plan.  Grace Isaac MD Beeper [phone] Office [phone] 02/11/2014 10:00 AM

## 2014-02-11 NOTE — Progress Notes (Signed)
UR Completed.  [phone]  

## 2014-02-11 NOTE — Sedation Documentation (Signed)
MD at bedside. Dr. Earleen Newport suturing the lines in place to the Rt IJ. Pt tolerating well.

## 2014-02-11 NOTE — Progress Notes (Signed)
VASCULAR LAB PRELIMINARY  PRELIMINARY  PRELIMINARY  PRELIMINARY  Bilateral lower extremity venous duplex completed.    Preliminary report:  Bilateral:  No evidence of DVT, superficial thrombosis, or Baker's Cyst.   Brinsley Wence, RVS 02/11/2014, 1:47 PM

## 2014-02-11 NOTE — Consult Note (Signed)
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   Has had stable NYHA III symptoms. Was admitted several months for a Chamberlainprocedure to biopsy the mass. Post-op he developed low-output HF a/b acute renal failure requiring milrinone support. He was admitted last night for pre-operative optimization prior to sternotomy for mass excision on Thursday.   Milrinone started overnight at 0.375. Became lightheaded and had a headache. Cr 1.2->1.7. Weight stable. No orthopnea or PND.   Review of Systems: [y] = yes, [ ]  = no   General: Weight gain [ ] ; Weight loss [ ] ; Anorexia [ ] ; Fatigue [ ] ; Fever [ ] ; Chills [ ] ; Weakness [ ]   Cardiac: Chest pain/pressure [ ] ; Resting SOB [ ] ; Exertional SOB [name] ]; Orthopnea [ ] ; Pedal Edema [ ] ; Palpitations [ ] ; Syncope [ ] ; Presyncope [ ] ; Paroxysmal nocturnal dyspnea[ ]   Pulmonary: Cough [ ] ; Wheezing[ ] ; Hemoptysis[ ] ; Sputum [ ] ; Snoring [ ]   GI: Vomiting[ ] ; Dysphagia[ ] ; Melena[ ] ; Hematochezia [ ] ; Heartburn[ ] ; Abdominal pain [ ] ; Constipation [ ] ; Diarrhea [ ] ; BRBPR [ ]   GU: Hematuria[ ] ; Dysuria [ ] ; Nocturia[ ]   Vascular: Pain in legs with walking [ ] ; Pain in feet with lying flat [ ] ; Non-healing sores [ ] ; Stroke [ ] ; TIA [ ] ; Slurred speech [ ] ;  Neuro: Headaches[ ] ; Vertigo[ ] ; Seizures[ ] ; Paresthesias[ ] ;Blurred vision [ ] ; Diplopia [ ] ; Vision changes [ ]   Ortho/Skin: Arthritis [name] ]; Joint pain [ ] ; Muscle pain [ ] ; Joint swelling [ ] ; Back Pain [ ] ; Rash [ ]   Psych: Depression[ ] ; Anxiety[ ]   Heme: Bleeding problems [ ] ; Clotting disorders [ ] ; Anemia [ ]   Endocrine: Diabetes [ ] ; Thyroid dysfunction[ ]   Home  Medications Prior to Admission medications   Medication Sig Start Date End Date Taking? Authorizing Provider  atorvastatin (LIPITOR) 40 MG tablet Take 1 tablet (40 mg total) by mouth at bedtime. 12/13/13  Yes Jolaine Artist, MD  carvedilol (COREG) 25 MG tablet Take 1 tablet (25 mg total) by mouth 2 (two) times daily with a meal. 02/25/13  Yes Amy D Clegg, NP  colchicine 0.6 MG tablet Take 0.6 mg by mouth daily as needed (gout).   Yes Historical Provider, MD  digoxin (LANOXIN) 0.25 MG tablet Take 0.125 mg by mouth daily.   Yes Historical Provider, MD  Febuxostat (ULORIC) 80 MG TABS Take 80 mg by mouth at bedtime.   Yes Historical Provider, MD  levothyroxine (SYNTHROID, LEVOTHROID) 150 MCG tablet Take 150 mcg by mouth daily before breakfast.   Yes Historical Provider, MD  losartan (COZAAR) 25 MG tablet Take 1 tablet (25 mg total) by mouth daily. 01/29/14  Yes Jolaine Artist, MD  magnesium oxide (MAG-OX) 400 MG tablet Take 400 mg by mouth 2 (two) times daily.    Yes Historical Provider, MD  OVER THE COUNTER MEDICATION Place 1 drop into both eyes daily as needed (red eyes). Eye drops   Yes Historical Provider, MD  tobramycin-dexamethasone [facility]) ophthalmic solution Place 2 drops into the right eye 2 (two) times daily. 12/09/13  Yes Historical Provider, MD  torsemide (DEMADEX) 20 MG tablet Take 1 tablet (20  mg total) by mouth daily. Patient taking differently: Take 20 mg by mouth daily. 20 mg Mon/Wed/Fri's and 1/2 tab (10 mg) all the other days 12/17/13  Yes Larey Dresser, MD    Past Medical History: Past Medical History  Diagnosis Date  . Gout 08/03/12    PT C/O OF RIGHT KNEE PAIN AND SWELLING -STATES GOUT FLARE UP - ONGOING SINCE APRIL - BUT SWELLING W/IN LAST WEEK  . Cardiomyopathy     Idiopathic dilated;   Marland Kitchen Ventricular tachycardia     s/p ICD  . Sleep apnea     CPAP  . Systolic CHF     EF 17%  . CKD (chronic kidney disease)     stage III baseline Crt 1.8-2.0  .  Biventricular ICD (implantable cardiac defibrillator) Medtronic ]     DOI 2008/ upgrade 2010/ Gen Change 2014  . Automatic implantable cardioverter-defibrillator in situ   . CHF (congestive heart failure)   . Bladder tumor     PT HOSP AT [facility] 4/24 TO 4/26 2014 WITH UTI--AND FOUND TO HAVE BLADDER TUMOR-  . Arthritis     GOUT  . Hilar mass     Noted CT 2010  . HTN (hypertension)     Dr. Caryl Comes cardiologist  . Coronary artery disease   . Hypothyroidism   . Paraganglioma     //neuroendocrine tumor of the right chest per notes 02/10/2014    Past Surgical History: Past Surgical History  Procedure Laterality Date  . Cholecystectomy    . Cardiac catheterization      he was found to have normal coronary arteries but with a globally dilated and hypocontractile heart  . US echocardiography  03-17-2008, 07-03-2006    EF 15-20%, EF 15-20%  . Transurethral resection of bladder tumor N/A 08/13/2012    Procedure: TRANSURETHRAL RESECTION OF BLADDER TUMOR (TURBT);  Surgeon: Claybon Jabs, MD;  Location: [facility];  Service: Urology;  Laterality: N/A;  MITOMYCIN C  . Cardiac defibrillator placement      DOI 2008/ upgrade 2010/ Gen Change 2014   . Endobronchial ultrasound Bilateral 10/01/2012    Procedure: ENDOBRONCHIAL ULTRASOUND;  Surgeon: Collene Gobble, MD;  [facility]: [facility];  Service: Cardiopulmonary;  Laterality: Bilateral;  . Cornea replacement    . Video bronchoscopy N/A 10/25/2012    Procedure: VIDEO BRONCHOSCOPY;  Surgeon: [name], MD;  Location: [place];  Service: Thoracic;  Laterality: N/A;  . Mediastinoscopy N/A 10/25/2012    Procedure: MEDIASTINOSCOPY;  Surgeon: [name], MD;  Location: [place];  Service: Thoracic;  Laterality: N/A;  . Tonsillectomy    . Mediastinotomy  09/10/2013    paratracheal mass    DR VANTRIGT  . Chest tube insertion  09/10/2013  . Mediastinotomy chamberlain mcneil Right 09/10/2013    Procedure: MEDIASTINOTOMY CHAMBERLAIN MCNEIL;  Surgeon: [name], MD;  Location: [place];  Service: Thoracic;  Laterality: Right;  . Biv icd genertaor change out      DOI 2008/ upgrade 2010/ Gen Change 2014     Family History: History reviewed. No pertinent family history.  Social History: History   Social History  . Marital Status: Married    Spouse Name: N/A    Number of Children: 2  . Years of Education: N/A   Occupational History  . [employer]   Social History Main Topics  . Smoking status: Never Smoker   . Smokeless tobacco: Never Used  . Alcohol Use: No  .  Drug Use: No  . Sexual Activity: No   Other Topics Concern  . None   Social History Narrative    Allergies:  Allergies  Allergen Reactions  . Bidil [Isosorb Dinitrate-Hydralazine]     Headaches   . Spironolactone     Hyperkalemia   . Hydralazine Hcl     Fuzzy headed    Objective:    Vital Signs:   Temp:  [97.6 F (36.4 C)-98.8 F (37.1 C)] 97.6 F (36.4 C) (12/08 0422) Pulse Rate:  [81-101] 88 (12/08 1025) Resp:  [18-22] 22 (12/08 0422) BP: (97-134)/(49-91) 110/68 mmHg (12/08 1045) SpO2:  [96 %-99 %] 96 % (12/08 0422) Weight:  [114.8 kg (253 lb 1.4 oz)-116.756 kg (257 lb 6.4 oz)] 114.8 kg (253 lb 1.4 oz) (12/08 0422) Last BM Date: 02/09/14  Weight change: Filed Weights   02/10/14 1554 02/11/14 0422  Weight: 116.756 kg (257 lb 6.4 oz) 114.8 kg (253 lb 1.4 oz)    Intake/Output:   Intake/Output Summary (Last 24 hours) at 02/11/14 1401 Last data filed at 02/11/14 0750  Gross per 24 hour  Intake    120 ml  Output      0 ml  Net    120 ml     Physical Exam: General:  Well appearing. No resp difficulty HEENT: normal Neck: supple. JVP flat. Carotids 2+ bilat; no bruits. No lymphadenopathy or thryomegaly appreciated. Cor: PMI laterally displaced. Regular rate & rhythm. No rubs, gallops or murmurs. Lungs: clear Abdomen: soft, nontender, nondistended. No hepatosplenomegaly. No bruits or masses. Good bowel sounds. Extremities: no  cyanosis, clubbing, rash, edema Neuro: alert & orientedx3, cranial nerves grossly intact. moves all 4 extremities w/o difficulty. Affect pleasant  Telemetry: SR  Labs: Basic Metabolic Panel:  Recent Labs Lab 02/10/14 1755 02/11/14 0749  NA 137 136*  K 4.5 4.3  CL 98 98  CO2 24 22  GLUCOSE 133* 135*  BUN 21 26*  CREATININE 1.33 1.75*  CALCIUM 9.9 9.7  MG 1.6  --     Liver Function Tests:  Recent Labs Lab 02/10/14 1755  AST 19  ALT 22  ALKPHOS 75  BILITOT 0.7  PROT 8.1  ALBUMIN 3.7   No results for input(s): LIPASE, AMYLASE in the last 168 hours. No results for input(s): AMMONIA in the last 168 hours.  CBC:  Recent Labs Lab 02/10/14 1755  WBC 9.6  HGB 17.4*  HCT 50.6  MCV 83.6  PLT 160    Cardiac Enzymes: No results for input(s): CKTOTAL, CKMB, CKMBINDEX, TROPONINI in the last 168 hours.  BNP: BNP (last 3 results)  Recent Labs  01/20/14 1414 02/10/14 1755  PROBNP 519.4* 653.7*    CBG: No results for input(s): GLUCAP in the last 168 hours.  Coagulation Studies:  Recent Labs  02/10/14 1755  LABPROT 13.7  INR 1.04    Other results:   Imaging: X-ray Chest Pa And Lateral  02/11/2014   CLINICAL DATA:  Preop for cyst removal.  EXAM: CHEST  2 VIEW  COMPARISON:  CT chest 01/15/2014.  Chest 11/05/2013.  FINDINGS: Right paratracheal mass again demonstrated, measuring 7 x 9.2 cm. Surgical clips demonstrated in the right upper lung region. Cardiac pacemaker. Normal heart size and pulmonary vascularity. No focal airspace infiltration in the lungs. No blunting of costophrenic angles. Degenerative changes in the spine.  IMPRESSION: Right paratracheal mass.  No acute infiltrates.   Electronically Signed   By: Lucienne Capers M.D.   On: 02/11/2014 00:14  Medications:     Current Medications: . atorvastatin  40 mg Oral q1800  . [START ON 02/12/2014] carvedilol  6.25 mg Oral BID WC  . digoxin  0.125 mg Oral Daily  . docusate sodium  100 mg  Oral Daily  . febuxostat  80 mg Oral QHS  . fentaNYL      . heparin subcutaneous  5,000 Units Subcutaneous 3 times per day  . levothyroxine  150 mcg Oral QAC breakfast  . magnesium oxide  400 mg Oral BID  . midazolam      . sodium chloride  3 mL Intravenous Q12H  . tobramycin-dexamethasone  2 drop Right Eye BID     Infusions: . milrinone 0.25 mcg/kg/min (02/11/14 1115)      Assessment:   1. Chronic systolic HF due to NICM    --with episode of low output HF requiring milrinone support this summer 2. Lage mediastinal neuroendocrine tumor 3. Acute on chronic renal failure, stage III 4. Hypomagenesemia 5. Polycythemia   Plan/Discussion:    I suspect he is a bit dry. Will hold demadex. And give 150cc NS. Drop milrinone to 0.25. Central line being placed to day so can follow co-ox and CVPs as needed. Will supp magnesium. Watch renal function closely.   For high-risk tumor excision on Thursday with Dr. Prescott Gum.    Length of Stay: 1  Glori Bickers MD 02/11/2014, 2:01 PM  Advanced Heart Failure Team Pager [phone] (M-F; 7a - 4p)  Please contact [facility] for night-coverage after hours (4p -7a ) and weekends on [email or web address]

## 2014-02-11 NOTE — Progress Notes (Signed)
  Echocardiogram 2D Echocardiogram has been performed.  Diamond Nickel 02/11/2014, 12:32 PM

## 2014-02-11 NOTE — Consult Note (Signed)
[name] Admit Date: 02/10/2014 02/11/2014 [place] Agent Requesting Physician:  [name]   Reason for Consult:  CKD3, anticipated thoracic surgery  HPI:  61M with CKD3 (BL SCr 1.4, CKA Coladonato, thought 2/2 chronic GN / IgAN and R chest paraganglioma/neuroendocrine tumor with causing esophageal and potential tracheal impingement.  Plan is for resection 02/13/14.  Has chronic sCHF/NICM and had AKI related to this at time of biopsy of tumor this summer, req milrinone.  Has ICD.   Recent renal functio nat baseline.  Has been given bolus IVFs, again today.   CREAT (mg/dL)  Date Value  06/28/2012 1.76*   CREATININE, SER (mg/dL)  Date Value  02/11/2014 1.75*  02/10/2014 1.33  01/20/2014 1.51*  12/24/2013 1.39*  09/18/2013 1.35  09/17/2013 1.51*  09/16/2013 1.47*  09/15/2013 1.79*  09/14/2013 2.04*  09/13/2013 2.07*  ] I/Os: I/O last 3 completed shifts: In: 20 [P.O.:480] Out: -   ROS  Balance of 12 systems is negative w/ exceptions as above  PMH  Past Medical History  Diagnosis Date  . Gout 08/03/12    PT C/O OF RIGHT KNEE PAIN AND SWELLING -STATES GOUT FLARE UP - ONGOING SINCE APRIL - BUT SWELLING W/IN LAST WEEK  . Cardiomyopathy     Idiopathic dilated;   Marland Kitchen Ventricular tachycardia     s/p ICD  . Sleep apnea     CPAP  . Systolic CHF     EF 93%  . CKD (chronic kidney disease)     stage III baseline Crt 1.8-2.0  . Biventricular ICD (implantable cardiac defibrillator) Medtronic ]     DOI 2008/ upgrade 2010/ Gen Change 2014  . Automatic implantable cardioverter-defibrillator in situ   . CHF (congestive heart failure)   . Bladder tumor     PT HOSP AT [facility] 4/24 TO 4/26 2014 WITH UTI--AND FOUND TO HAVE BLADDER TUMOR-  . Arthritis     GOUT  . Hilar mass     Noted CT 2010  . HTN (hypertension)     Dr. Caryl Comes cardiologist  . Coronary artery disease   . Hypothyroidism   . Paraganglioma     //neuroendocrine tumor of the right chest per notes 02/10/2014    PSH  Past Surgical History  Procedure Laterality Date  . Cholecystectomy    . Cardiac catheterization      he was found to have normal coronary arteries but with a globally dilated and hypocontractile heart  . US echocardiography  03-17-2008, 07-03-2006    EF 15-20%, EF 15-20%  . Transurethral resection of bladder tumor N/A 08/13/2012    Procedure: TRANSURETHRAL RESECTION OF BLADDER TUMOR (TURBT);  Surgeon: Claybon Jabs, MD;  [facility];  Service: Urology;  Laterality: N/A;  MITOMYCIN C  . Cardiac defibrillator placement      DOI 2008/ upgrade 2010/ Gen Change 2014   . Endobronchial ultrasound Bilateral 10/01/2012    Procedure: ENDOBRONCHIAL ULTRASOUND;  Surgeon: Collene Gobble, MD;  [facility];  Service: Cardiopulmonary;  Laterality: Bilateral;  . Cornea replacement    . Video bronchoscopy N/A 10/25/2012    Procedure: VIDEO BRONCHOSCOPY;  Surgeon: [name], MD;  Location: [place];  Service: Thoracic;  Laterality: N/A;  . Mediastinoscopy N/A 10/25/2012    Procedure: MEDIASTINOSCOPY;  Surgeon: [name], MD;  Location: [place];  Service: Thoracic;  Laterality: N/A;  . Tonsillectomy    . Mediastinotomy  09/10/2013    paratracheal mass    DR VANTRIGT  .  Chest tube insertion  09/10/2013  . Mediastinotomy chamberlain mcneil Right 09/10/2013    Procedure: MEDIASTINOTOMY CHAMBERLAIN MCNEIL;  Surgeon: [name], MD;  [facility];  Service: Thoracic;  Laterality: Right;  . Biv icd genertaor change out      DOI 2008/ upgrade 2010/ Gen Change 2014    [place] History reviewed. No pertinent family history. SH  reports that he has never smoked. He has never used smokeless tobacco. He reports that he does not drink alcohol or use illicit drugs. Allergies  Allergies  Allergen Reactions  . Bidil [Isosorb Dinitrate-Hydralazine]     Headaches   . Spironolactone     Hyperkalemia   . Hydralazine Hcl     Fuzzy headed   Home medications Prior to Admission  medications   Medication Sig Start Date End Date Taking? Authorizing Provider  atorvastatin (LIPITOR) 40 MG tablet Take 1 tablet (40 mg total) by mouth at bedtime. 12/13/13  Yes Jolaine Artist, MD  carvedilol (COREG) 25 MG tablet Take 1 tablet (25 mg total) by mouth 2 (two) times daily with a meal. 02/25/13  Yes Amy D Clegg, NP  colchicine 0.6 MG tablet Take 0.6 mg by mouth daily as needed (gout).   Yes Historical Provider, MD  digoxin (LANOXIN) 0.25 MG tablet Take 0.125 mg by mouth daily.   Yes Historical Provider, MD  Febuxostat (ULORIC) 80 MG TABS Take 80 mg by mouth at bedtime.   Yes Historical Provider, MD  levothyroxine (SYNTHROID, LEVOTHROID) 150 MCG tablet Take 150 mcg by mouth daily before breakfast.   Yes Historical Provider, MD  losartan (COZAAR) 25 MG tablet Take 1 tablet (25 mg total) by mouth daily. 01/29/14  Yes Jolaine Artist, MD  magnesium oxide (MAG-OX) 400 MG tablet Take 400 mg by mouth 2 (two) times daily.    Yes Historical Provider, MD  OVER THE COUNTER MEDICATION Place 1 drop into both eyes daily as needed (red eyes). Eye drops   Yes Historical Provider, MD  tobramycin-dexamethasone [facility]) ophthalmic solution Place 2 drops into the right eye 2 (two) times daily. 12/09/13  Yes Historical Provider, MD  torsemide (DEMADEX) 20 MG tablet Take 1 tablet (20 mg total) by mouth daily. Patient taking differently: Take 20 mg by mouth daily. 20 mg Mon/Wed/Fri's and 1/2 tab (10 mg) all the other days 12/17/13  Yes Larey Dresser, MD    Current Medications Scheduled Meds: . atorvastatin  40 mg Oral q1800  . [START ON 02/12/2014] carvedilol  6.25 mg Oral BID WC  . digoxin  0.125 mg Oral Daily  . docusate sodium  100 mg Oral Daily  . febuxostat  80 mg Oral QHS  . heparin subcutaneous  5,000 Units Subcutaneous 3 times per day  . levothyroxine  150 mcg Oral QAC breakfast  . magnesium oxide  400 mg Oral BID  . sodium chloride  3 mL Intravenous Q12H  . tobramycin-dexamethasone   2 drop Right Eye BID   Continuous Infusions: . milrinone 0.25 mcg/kg/min (02/11/14 1115)   PRN Meds:.sodium chloride, acetaminophen **OR** acetaminophen, HYDROcodone-acetaminophen, ondansetron **OR** ondansetron (ZOFRAN) IV, sodium chloride  CBC  Recent Labs Lab 02/10/14 1755  WBC 9.6  HGB 17.4*  HCT 50.6  MCV 83.6  PLT 174   Basic Metabolic Panel  Recent Labs Lab 02/10/14 1755 02/11/14 0749  NA 137 136*  K 4.5 4.3  CL 98 98  CO2 24 22  GLUCOSE 133* 135*  BUN 21 26*  CREATININE 1.33 1.75*  CALCIUM 9.9 9.7    Physical Exam  Blood pressure 110/68, pulse 88, temperature 97.6 F (36.4 C), temperature source Oral, resp. rate 22, height 5\' 10"  (1.778 m), weight 114.8 kg (253 lb 1.4 oz), SpO2 96 %. GEN: NAD ENT: NCAT EYES: EOMI CV: RRR PULM: CTAB, nl wob. 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    1. CKD3 1. 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 BL SCr 1.4; follows with Coladonato 3. Slight worsening in GFR since admission 4. Diuretics held, rec IVF 5. Follow for now, no clear explanation 6. On milrinone 2. CHF, systolic, chronic 1. AHF following 2. Milrinone gtt, other adjunctive meds 3. Paraganglioma of Chest, structural impingement 1. Tentative for resection 02/13/14   Pearson Grippe MD 02/11/2014, 12:14 PM

## 2014-02-11 NOTE — Sedation Documentation (Signed)
Jamie Franklin, PA at bedside to evaluate and s/w pt.  Questions answered.

## 2014-02-11 NOTE — Sedation Documentation (Signed)
Patient denies pain and is resting comfortably.  

## 2014-02-11 NOTE — Procedures (Signed)
Interventional Radiology Procedure Note  Procedure: Placement of a right IJ approach single lumen tunneled central catheter.  Tip at the cavoatrial junction Complications: No immediate Recommendations:  - Ok to shower tomorrow  - Routine line care   Signed,  Dulcy Fanny. Earleen Newport, DO

## 2014-02-11 NOTE — Sedation Documentation (Signed)
Pt requested facemask for procedure so will be unable to obtain CO2.  Nasal cannula removed. Simple mask applied at 2L. Sats 98%. Waiting for Dr. Earleen Newport to do timeout and begin procedure.

## 2014-02-12 ENCOUNTER — Encounter (HOSPITAL_COMMUNITY): Payer: Self-pay | Admitting: Certified Registered Nurse Anesthetist

## 2014-02-12 DIAGNOSIS — N183 Chronic kidney disease, stage 3 (moderate): Secondary | ICD-10-CM

## 2014-02-12 DIAGNOSIS — D447 Neoplasm of uncertain behavior of aortic body and other paraganglia: Secondary | ICD-10-CM

## 2014-02-12 DIAGNOSIS — I5022 Chronic systolic (congestive) heart failure: Secondary | ICD-10-CM

## 2014-02-12 DIAGNOSIS — N179 Acute kidney failure, unspecified: Secondary | ICD-10-CM

## 2014-02-12 LAB — PROTIME-INR
INR: 1.1 (ref 0.00–1.49)
Prothrombin Time: 14.4 seconds (ref 11.6–15.2)

## 2014-02-12 LAB — BASIC METABOLIC PANEL
ANION GAP: 14 (ref 5–15)
BUN: 29 mg/dL — ABNORMAL HIGH (ref 6–23)
CALCIUM: 9.2 mg/dL (ref 8.4–10.5)
CO2: 24 meq/L (ref 19–32)
Chloride: 100 mEq/L (ref 96–112)
Creatinine, Ser: 1.84 mg/dL — ABNORMAL HIGH (ref 0.50–1.35)
GFR calc non Af Amer: 40 mL/min — ABNORMAL LOW (ref >90)
GFR, EST AFRICAN AMERICAN: 46 mL/min — AB (ref >90)
Glucose, Bld: 122 mg/dL — ABNORMAL HIGH (ref 70–99)
Potassium: 4.5 mEq/L (ref 3.7–5.3)
Sodium: 138 mEq/L (ref 137–147)

## 2014-02-12 LAB — COMPREHENSIVE METABOLIC PANEL
ALT: 18 U/L (ref 0–53)
AST: 16 U/L (ref 0–37)
Albumin: 3.5 g/dL (ref 3.5–5.2)
Alkaline Phosphatase: 72 U/L (ref 39–117)
Anion gap: 14 (ref 5–15)
BUN: 27 mg/dL — ABNORMAL HIGH (ref 6–23)
CO2: 24 mEq/L (ref 19–32)
Calcium: 9.2 mg/dL (ref 8.4–10.5)
Chloride: 99 mEq/L (ref 96–112)
Creatinine, Ser: 1.57 mg/dL — ABNORMAL HIGH (ref 0.50–1.35)
GFR calc Af Amer: 56 mL/min — ABNORMAL LOW (ref >90)
GFR calc non Af Amer: 48 mL/min — ABNORMAL LOW (ref >90)
Glucose, Bld: 142 mg/dL — ABNORMAL HIGH (ref 70–99)
Potassium: 4.4 mEq/L (ref 3.7–5.3)
Sodium: 137 mEq/L (ref 137–147)
Total Bilirubin: 1.1 mg/dL (ref 0.3–1.2)
Total Protein: 7.7 g/dL (ref 6.0–8.3)

## 2014-02-12 LAB — CBC
HCT: 46 % (ref 39.0–52.0)
Hemoglobin: 15.7 g/dL (ref 13.0–17.0)
MCH: 28.4 pg (ref 26.0–34.0)
MCHC: 34.1 g/dL (ref 30.0–36.0)
MCV: 83.2 fL (ref 78.0–100.0)
Platelets: 153 10*3/uL (ref 150–400)
RBC: 5.53 MIL/uL (ref 4.22–5.81)
RDW: 14.6 % (ref 11.5–15.5)
WBC: 9.7 10*3/uL (ref 4.0–10.5)

## 2014-02-12 LAB — URINALYSIS, ROUTINE W REFLEX MICROSCOPIC
Bilirubin Urine: NEGATIVE
Glucose, UA: NEGATIVE mg/dL
Hgb urine dipstick: NEGATIVE
Ketones, ur: NEGATIVE mg/dL
Leukocytes, UA: NEGATIVE
Nitrite: NEGATIVE
Protein, ur: 30 mg/dL — AB
Specific Gravity, Urine: 1.018 (ref 1.005–1.030)
Urobilinogen, UA: 0.2 mg/dL (ref 0.0–1.0)
pH: 6 (ref 5.0–8.0)

## 2014-02-12 LAB — URINE MICROSCOPIC-ADD ON

## 2014-02-12 LAB — CARBOXYHEMOGLOBIN
Carboxyhemoglobin: 1.7 % — ABNORMAL HIGH (ref 0.5–1.5)
Methemoglobin: 0.9 % (ref 0.0–1.5)
O2 Saturation: 70.4 %
Total hemoglobin: 16.7 g/dL (ref 13.5–18.0)

## 2014-02-12 LAB — BLOOD GAS, ARTERIAL
Acid-base deficit: 0 mmol/L (ref 0.0–2.0)
Bicarbonate: 24.2 mEq/L — ABNORMAL HIGH (ref 20.0–24.0)
Drawn by: 36527
FIO2: 0.21 %
O2 Saturation: 96.9 %
Patient temperature: 98.6
TCO2: 25.4 mmol/L (ref 0–100)
pCO2 arterial: 39.9 mmHg (ref 35.0–45.0)
pH, Arterial: 7.399 (ref 7.350–7.450)
pO2, Arterial: 84.6 mmHg (ref 80.0–100.0)

## 2014-02-12 LAB — PREPARE RBC (CROSSMATCH)

## 2014-02-12 LAB — MAGNESIUM: Magnesium: 2 mg/dL (ref 1.5–2.5)

## 2014-02-12 LAB — MRSA PCR SCREENING: MRSA by PCR: NEGATIVE

## 2014-02-12 LAB — APTT: aPTT: 38 seconds — ABNORMAL HIGH (ref 24–37)

## 2014-02-12 MED ORDER — SODIUM CHLORIDE 0.9 % IV BOLUS (SEPSIS): 500.0000 mL | Freq: Once | INTRAVENOUS | Status: DC

## 2014-02-12 MED ORDER — SODIUM CHLORIDE 0.9 % IV BOLUS (SEPSIS)
250.0000 mL | Freq: Once | INTRAVENOUS | Status: AC
  Administered 2014-02-12: 250 mL via INTRAVENOUS

## 2014-02-12 MED ORDER — DEXTROSE 5 % IV SOLN
1.5000 g | INTRAVENOUS | Status: AC
  Administered 2014-02-13: 1.5 g via INTRAVENOUS
  Filled 2014-02-12 (×2): qty 1.5

## 2014-02-12 MED ORDER — DEXTROSE 5 % IV SOLN
1.5000 g | INTRAVENOUS | Status: DC
  Filled 2014-02-12: qty 1.5

## 2014-02-12 NOTE — Progress Notes (Addendum)
      [address]       [place] [address]             [phone]          Procedure(s) (LRB): VIDEO ASSISTED THORACOSCOPY (VATS)/THOROCOTOMY (Right) RESECTION OF MEDIASTINAL MASS (Right)  Subjective: Patient had tunnel cath placed in IR yesterday. Still with headache  Objective: Vital signs in last 24 hours: Temp:  [98.3 F (36.8 C)-99.8 F (37.7 C)] 98.3 F (36.8 C) (12/09 0622) Pulse Rate:  [88-109] 94 (12/09 0622) Cardiac Rhythm:  [-] Ventricular paced (12/08 2030) Resp:  [17-22] 17 (12/09 0622) BP: (110-150)/(57-77) 120/74 mmHg (12/09 0622) SpO2:  [94 %-97 %] 97 % (12/09 0622)   Current Weight  02/11/14 253 lb 1.4 oz (114.8 kg)      Intake/Output from previous day: 12/08 0701 - 12/09 0700 In: 480 [P.O.:480] Out: -    Physical Exam:  Cardiovascular: RRR Pulmonary: Clear to auscultation bilaterally; no rales, wheezes, or rhonchi. Abdomen: Soft, non tender, bowel sounds present. Extremities: Trace bilateral lower extremity edema.   Lab Results: CBC:  Recent Labs  02/10/14 1755  WBC 9.6  HGB 17.4*  HCT 50.6  PLT 160   BMET:   Recent Labs  02/11/14 0749 02/12/14 0600  NA 136* 138  K 4.3 4.5  CL 98 100  CO2 22 24  GLUCOSE 135* 122*  BUN 26* 29*  CREATININE 1.75* 1.84*  CALCIUM 9.7 9.2    PT/INR:  Lab Results  Component Value Date   INR 1.04 02/10/2014   INR 1.15 11/05/2013   INR 1.21 09/10/2013   ABG:  INR: Will add last result for INR, ABG once components are confirmed Will add last 4 CBG results once components are confirmed  Assessment/Plan:  1. CV - BP improved. On Digoxin 0.125 daily, Coreg 6.25 bid, and Milrinone drip 0.25 mcg/kg/min. Echo done yesterday showed LVEF 15-20%, NO AI/AS, NO MS and trivial MR. 2. History of CHF- BNP 653.7. On Demadex as taken at home. Heart failure team following. 3.Creatinine increased from 1.75 to 1.84. He has a history of CKD (baseline Cr 1.8-2) although, creatinine 1.33  upon admission.Not on ACE or ARB. 4. Duplex venous US NEGATIVE for DVT 5. Likely surgery in am with Dr. Prescott Gum  ZIMMERMAN,DONIELLE MPA-C 02/12/2014,7:27 AM   Patient seen and examined, agree with above His co-ox is 70. His creatinine was up this morning at 1.84. He is having a repeat done this evening For resection of paraganglioma in AM Questions answered

## 2014-02-12 NOTE — Progress Notes (Addendum)
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    He has had stable NYHA III symptoms. Was admitted several months for a [place] to biopsy the mass. Post-op he developed low-output HF a/b acute renal failure requiring milrinone support. He was admitted last night for pre-operative optimization prior to sternotomy for mass excision 12/10.  Remains on milrinone 0.375 mcg and continues to have a headache. Renal function up slightly, 1.84. Denies SOB, orthopnea or CP. Received 4 gm Magnesium.    Objective:   Weight Range:  Vital Signs:   Temp:  [98.3 F (36.8 C)-99.8 F (37.7 C)] 98.3 F (36.8 C) (12/09 0622) Pulse Rate:  [91-109] 92 (12/09 0951) Resp:  [17-22] 17 (12/09 0622) BP: (111-150)/(57-77) 124/68 mmHg (12/09 0951) SpO2:  [94 %-97 %] 97 % (12/09 0622) Last BM Date: 02/09/14  Weight change: Filed Weights   02/10/14 1554 02/11/14 0422  Weight: 257 lb 6.4 oz (116.756 kg) 253 lb 1.4 oz (114.8 kg)    Intake/Output:   Intake/Output Summary (Last 24 hours) at 02/12/14 1057 Last data filed at 02/12/14 0800  Gross per 24 hour  Intake    720 ml  Output      0 ml  Net    720 ml     Physical Exam: General: Well appearing. No resp difficulty HEENT: normal Neck: supple. JVP flat. Carotids 2+ bilat; no bruits. No lymphadenopathy or thryomegaly appreciated. Cor: PMI laterally displaced. Regular rate & rhythm. No rubs, gallops or murmurs. Lungs: clear Abdomen: soft, nontender, nondistended. No hepatosplenomegaly. No bruits or masses. Good bowel sounds. Extremities: no cyanosis, clubbing, rash, edema Neuro: alert & orientedx3, cranial nerves grossly intact. moves all 4 extremities w/o  difficulty. Affect pleasant  Telemetry: SR  Labs: Basic Metabolic Panel:  Recent Labs Lab 02/10/14 1755 02/11/14 0749 02/12/14 0600  NA 137 136* 138  K 4.5 4.3 4.5  CL 98 98 100  CO2 24 22 24   GLUCOSE 133* 135* 122*  BUN 21 26* 29*  CREATININE 1.33 1.75* 1.84*  CALCIUM 9.9 9.7 9.2  MG 1.6  --   --     Liver Function Tests:  Recent Labs Lab 02/10/14 1755  AST 19  ALT 22  ALKPHOS 75  BILITOT 0.7  PROT 8.1  ALBUMIN 3.7   No results for input(s): LIPASE, AMYLASE in the last 168 hours. No results for input(s): AMMONIA in the last 168 hours.  CBC:  Recent Labs Lab 02/10/14 1755  WBC 9.6  HGB 17.4*  HCT 50.6  MCV 83.6  PLT 160    Cardiac Enzymes: No results for input(s): CKTOTAL, CKMB, CKMBINDEX, TROPONINI in the last 168 hours.  BNP: BNP (last 3 results)  Recent Labs  01/20/14 1414 02/10/14 1755  PROBNP 519.4* 653.7*     Other results:  EKG:   Imaging: X-ray Chest Pa And Lateral  02/11/2014   CLINICAL DATA:  Preop for cyst removal.  EXAM: CHEST  2 VIEW  COMPARISON:  CT chest 01/15/2014.  Chest 11/05/2013.  FINDINGS: Right paratracheal mass again demonstrated, measuring 7 x 9.2 cm. Surgical clips demonstrated in the right upper lung region. Cardiac pacemaker. Normal heart size and pulmonary vascularity. No focal airspace infiltration in the lungs. No blunting of costophrenic angles.  Degenerative changes in the spine.  IMPRESSION: Right paratracheal mass.  No acute infiltrates.   Electronically Signed   By: Lucienne Capers M.D.   On: 02/11/2014 00:14   Ir Fluoro Guide Cv Line Right  02/11/2014   CLINICAL DATA:  53 year old male with a history of mediastinal tumor.  EXAM: IR RIGHT FLOURO GUIDE CV LINE; IR ULTRASOUND GUIDANCE VASC ACCESS RIGHT  Date: 02/11/2014  ANESTHESIA/SEDATION: Total sedation time: 20 minutes  Moderate (conscious) sedation was administered during this procedure. A total of 1.0 mg Versed and 50 Mg Fentanyl were administered  intravenously. The patient's vital signs were monitored continuously by radiology nursing throughout the course of the procedure.  FLUOROSCOPY TIME:  30 seconds  TECHNIQUE: The right neck and chest was prepped with chlorhexidine, and draped in the usual sterile fashion using maximum barrier technique (cap and mask, sterile gown, sterile gloves, large sterile sheet, hand hygiene and cutaneous antiseptic). Local anesthesia was attained by infiltration with 1% lidocaine with epinephrine.  Ultrasound demonstrated patency of the right internal jugular vein, and this was documented with an image. Under real-time ultrasound guidance, this vein was accessed with a 21 gauge micropuncture needle and image documentation was performed. A small dermatotomy was made at the access site with an 11 scalpel. A 0.018" wire was advanced into the SVC and the access needle exchanged for a 87F micropuncture vascular sheath. The 0.018" wire was then removed and a 0.035" wire advanced into the IVC.  The vascular peel-away sheath was then placed over the wire. The catheter was tunneled from the right chest wall to the dermatotomy at the right neck, and the catheter was cut to the pre measured length. The catheter was then advanced to the peel-away sheath with hers moved. A final image was stored.  Dermabond was used to seal the 2 small incision sites, and the catheter was sutured to the chest wall with 2 Prolene retention sutures.  The patient tolerated the procedure well and remained hemodynamically stable throughout.  No complications were encountered and no significant blood loss was encountered.  COMPLICATIONS: None.  The patient tolerated the procedure well.  FINDINGS: Final image demonstrates the tunneled catheter from right IJ access with the tip at the cavoatrial junction.  Silhouette of posterior mediastinal tumor is seen on the chest x-ray, documented and characterize better on prior cross-sectional imaging.  Cardiac pacing device  also in place.  IMPRESSION: Status post placement of single-lumen low profile tunneled central venous catheter from right IJ approach. Catheter ready for use.  Signed,  Dulcy Fanny. Earleen Newport, DO  [facility]  [facility]   Electronically Signed   By: Corrie Mckusick D.O.   On: 02/11/2014 16:47   Ir US Guide Vasc Access Right  02/11/2014   CLINICAL DATA:  54 year old male with a history of mediastinal tumor.  EXAM: IR RIGHT FLOURO GUIDE CV LINE; IR ULTRASOUND GUIDANCE VASC ACCESS RIGHT  Date: 02/11/2014  ANESTHESIA/SEDATION: Total sedation time: 20 minutes  Moderate (conscious) sedation was administered during this procedure. A total of 1.0 mg Versed and 50 Mg Fentanyl were administered intravenously. The patient's vital signs were monitored continuously by radiology nursing throughout the course of the procedure.  FLUOROSCOPY TIME:  30 seconds  TECHNIQUE: The right neck and chest was prepped with chlorhexidine, and draped in the usual sterile fashion using maximum barrier technique (cap and mask, sterile gown, sterile gloves, large sterile sheet, hand hygiene and cutaneous antiseptic). Local anesthesia was attained by infiltration with  1% lidocaine with epinephrine.  Ultrasound demonstrated patency of the right internal jugular vein, and this was documented with an image. Under real-time ultrasound guidance, this vein was accessed with a 21 gauge micropuncture needle and image documentation was performed. A small dermatotomy was made at the access site with an 11 scalpel. A 0.018" wire was advanced into the SVC and the access needle exchanged for a 63F micropuncture vascular sheath. The 0.018" wire was then removed and a 0.035" wire advanced into the IVC.  The vascular peel-away sheath was then placed over the wire. The catheter was tunneled from the right chest wall to the dermatotomy at the right neck, and the catheter was cut to the pre measured length. The catheter was  then advanced to the peel-away sheath with hers moved. A final image was stored.  Dermabond was used to seal the 2 small incision sites, and the catheter was sutured to the chest wall with 2 Prolene retention sutures.  The patient tolerated the procedure well and remained hemodynamically stable throughout.  No complications were encountered and no significant blood loss was encountered.  COMPLICATIONS: None.  The patient tolerated the procedure well.  FINDINGS: Final image demonstrates the tunneled catheter from right IJ access with the tip at the cavoatrial junction.  Silhouette of posterior mediastinal tumor is seen on the chest x-ray, documented and characterize better on prior cross-sectional imaging.  Cardiac pacing device also in place.  IMPRESSION: Status post placement of single-lumen low profile tunneled central venous catheter from right IJ approach. Catheter ready for use.  Signed,  Dulcy Fanny. Earleen Newport, DO  [facility]  [facility]   Electronically Signed   By: Corrie Mckusick D.O.   On: 02/11/2014 16:47      Medications:     Scheduled Medications: . atorvastatin  40 mg Oral q1800  . carvedilol  6.25 mg Oral BID WC  . [START ON 02/13/2014] cefUROXime (ZINACEF)  IV  1.5 g Intravenous On Call to OR  . digoxin  0.125 mg Oral Daily  . docusate sodium  100 mg Oral Daily  . febuxostat  80 mg Oral QHS  . heparin subcutaneous  5,000 Units Subcutaneous 3 times per day  . levothyroxine  150 mcg Oral QAC breakfast  . magnesium oxide  400 mg Oral BID  . sodium chloride  3 mL Intravenous Q12H  . tobramycin-dexamethasone  2 drop Right Eye BID     Infusions: . milrinone 0.25 mcg/kg/min (02/12/14 0454)     PRN Medications:  sodium chloride, acetaminophen **OR** acetaminophen, HYDROcodone-acetaminophen, ondansetron **OR** ondansetron (ZOFRAN) IV, sodium chloride, sodium chloride   Assessment:   1. Chronic systolic HF due to NICM  --with episode  of low output HF requiring milrinone support this summer 2. Lage mediastinal neuroendocrine tumor 3. Acute on chronic renal failure, stage III 4. Hypomagenesemia 5. Polycythemia  Plan/Discussion:    Stable overnight. Continue to hold demadex he appears dry and renal function elevated. Will discuss with Dr. Haroldine Laws about giving another 150 NS bolus.   Continue milrinone 0.25 mcg.   Will recheck Magnesium tomorrow and uric acid upon patient's request.  Continue to ambulate with CR.  Going for high risk tumor excision on Thursday and we will continue to follow.   Length of Stay: 2 Rande Brunt  NP-C  02/12/2014, 10:57 AM  Advanced Heart Failure Team Pager [phone] (M-F; 7a - 4p)  Please contact [facility] for night-coverage after hours (4p -7a ) and weekends  on [email or web address]  Patient seen and examined with Junie Bame, NP. We discussed all aspects of the encounter. I agree with the assessment and plan as stated above.   Renal function slightly worse despite milrinone. Will give 500cc bolus. Hold diuretics. Check co-ox. continue milrinone. Need to have renal function stabilized prior to OR.   Jaquesha Boroff,MD 11:05 AM  Addendum: Co-ox 70% suggestive of good cardiac output. Agree with gentle hydration. Repeat BMET this afternoon.   Benay Spice 12:41 PM '

## 2014-02-12 NOTE — Progress Notes (Signed)
Procedure(s) (LRB): VIDEO ASSISTED THORACOSCOPY (VATS)/THOROCOTOMY (Right) RESECTION OF MEDIASTINAL MASS (Right) Subjective: Patient examined, 2D echocardiogram, CXR ABG reviewed Renal function is at baseline and EF, mxed venous sats are optimal for his non- ischemic cardiomyopathy. Will proceed wit R thoracotomy and resection of biopsy proven paraganglioma which has shown gradual enlarg ment over  the past 5 yrs and now is 8cm with associated symptoms of dysphagia and definite tracheal compression on xray.   The patient needs resection to avoid compromised airway even though this will be high risk to his cardiac and renal disease  The pt understand the risk of death, dialysis, prolonged vent dependence and bleeding requiring transfusion  Objective: Vital signs in last 24 hours: Temp:  [98.3 F (36.8 C)-99.8 F (37.7 C)] 98.8 F (37.1 C) (12/09 2000) Pulse Rate:  [90-109] 93 (12/09 2000) Cardiac Rhythm:  [-] Ventricular paced (12/09 0745) Resp:  [17-18] 18 (12/09 2000) BP: (116-127)/(68-93) 121/77 mmHg (12/09 2000) SpO2:  [96 %-97 %] 97 % (12/09 2000)  Hemodynamic parameters for last 24 hours:  nsr  Intake/Output from previous day: 12/08 0701 - 12/09 0700 In: 480 [P.O.:480] Out: -  lungs clear, no murmur, mild edema   ntake/Output this shift:      Lab Results:  Recent Labs  02/10/14 1755 02/12/14 1750  WBC 9.6 9.7  HGB 17.4* 15.7  HCT 50.6 46.0  PLT 160 153   BMET:  Recent Labs  02/12/14 0600 02/12/14 1750  NA 138 137  K 4.5 4.4  CL 100 99  CO2 24 24  GLUCOSE 122* 142*  BUN 29* 27*  CREATININE 1.84* 1.57*  CALCIUM 9.2 9.2    PT/INR:  Recent Labs  02/12/14 1750  LABPROT 14.4  INR 1.10   ABG    Component Value Date/Time   PHART 7.399 02/12/2014 1730   HCO3 24.2* 02/12/2014 1730   TCO2 25.4 02/12/2014 1730   ACIDBASEDEF 0.0 02/12/2014 1730   O2SAT 96.9 02/12/2014 1730   CBG (last 3)  No results for input(s): GLUCAP in the last 72  hours.  Assessment/Plan: S/P Procedure(s) (LRB): VIDEO ASSISTED THORACOSCOPY (VATS)/THOROCOTOMY (Right) RESECTION OF MEDIASTINAL MASS (Right) Plan surgery in am    LOS: 2 days    [name],PETER 02/12/2014

## 2014-02-13 ENCOUNTER — Encounter (HOSPITAL_COMMUNITY): Payer: Self-pay | Admitting: Internal Medicine

## 2014-02-13 ENCOUNTER — Inpatient Hospital Stay (HOSPITAL_COMMUNITY): Payer: 59 | Admitting: Anesthesiology

## 2014-02-13 ENCOUNTER — Inpatient Hospital Stay (HOSPITAL_COMMUNITY): Payer: 59

## 2014-02-13 ENCOUNTER — Inpatient Hospital Stay (HOSPITAL_COMMUNITY): Admission: RE | Admit: 2014-02-13 | Payer: 59 | Source: Ambulatory Visit | Admitting: Cardiothoracic Surgery

## 2014-02-13 ENCOUNTER — Encounter (HOSPITAL_COMMUNITY)
Admission: RE | Disposition: A | Discharge status: Home Health | Payer: Self-pay | Source: Ambulatory Visit | Attending: Cardiothoracic Surgery

## 2014-02-13 DIAGNOSIS — D447 Neoplasm of uncertain behavior of aortic body and other paraganglia: Secondary | ICD-10-CM

## 2014-02-13 HISTORY — PX: RESECTION OF MEDIASTINAL MASS: SHX6497

## 2014-02-13 HISTORY — PX: VIDEO ASSISTED THORACOSCOPY (VATS)/THOROCOTOMY: SHX6173

## 2014-02-13 LAB — POCT I-STAT 7, (LYTES, BLD GAS, ICA,H+H)
Acid-base deficit: 4 mmol/L — ABNORMAL HIGH (ref 0.0–2.0)
Acid-base deficit: 2 mmol/L (ref 0.0–2.0)
Bicarbonate: 22.3 mEq/L (ref 20.0–24.0)
Bicarbonate: 23.7 mEq/L (ref 20.0–24.0)
Calcium, Ion: 1.16 mmol/L (ref 1.12–1.23)
Calcium, Ion: 1.28 mmol/L — ABNORMAL HIGH (ref 1.12–1.23)
HCT: 45 % (ref 39.0–52.0)
HCT: 43 % (ref 39.0–52.0)
Hemoglobin: 15.3 g/dL (ref 13.0–17.0)
Hemoglobin: 14.6 g/dL (ref 13.0–17.0)
O2 Saturation: 90 %
O2 Saturation: 92 %
Patient temperature: 35.7
Patient temperature: 35.9
Potassium: 5.2 mEq/L (ref 3.7–5.3)
Potassium: 6.1 mEq/L — ABNORMAL HIGH (ref 3.7–5.3)
Sodium: 134 mEq/L — ABNORMAL LOW (ref 137–147)
Sodium: 132 mEq/L — ABNORMAL LOW (ref 137–147)
TCO2: 24 mmol/L (ref 0–100)
TCO2: 25 mmol/L (ref 0–100)
pCO2 arterial: 39.7 mmHg (ref 35.0–45.0)
pCO2 arterial: 42.7 mmHg (ref 35.0–45.0)
pH, Arterial: 7.35 (ref 7.350–7.450)
pH, Arterial: 7.346 — ABNORMAL LOW (ref 7.350–7.450)
pO2, Arterial: 58 mmHg — ABNORMAL LOW (ref 80.0–100.0)
pO2, Arterial: 64 mmHg — ABNORMAL LOW (ref 80.0–100.0)

## 2014-02-13 LAB — POCT I-STAT 3, ART BLOOD GAS (G3+)
Acid-base deficit: 3 mmol/L — ABNORMAL HIGH (ref 0.0–2.0)
Bicarbonate: 23.7 mEq/L (ref 20.0–24.0)
O2 Saturation: 98 %
Patient temperature: 37.1
TCO2: 25 mmol/L (ref 0–100)
pCO2 arterial: 46 mmHg — ABNORMAL HIGH (ref 35.0–45.0)
pH, Arterial: 7.32 — ABNORMAL LOW (ref 7.350–7.450)
pO2, Arterial: 109 mmHg — ABNORMAL HIGH (ref 80.0–100.0)

## 2014-02-13 LAB — BASIC METABOLIC PANEL
Anion gap: 14 (ref 5–15)
Anion gap: 11 (ref 5–15)
BUN: 25 mg/dL — ABNORMAL HIGH (ref 6–23)
BUN: 26 mg/dL — ABNORMAL HIGH (ref 6–23)
CO2: 23 mEq/L (ref 19–32)
CO2: 22 mEq/L (ref 19–32)
Calcium: 9.4 mg/dL (ref 8.4–10.5)
Calcium: 9 mg/dL (ref 8.4–10.5)
Chloride: 97 mEq/L (ref 96–112)
Chloride: 102 mEq/L (ref 96–112)
Creatinine, Ser: 1.46 mg/dL — ABNORMAL HIGH (ref 0.50–1.35)
Creatinine, Ser: 1.43 mg/dL — ABNORMAL HIGH (ref 0.50–1.35)
GFR calc Af Amer: 63 mL/min — ABNORMAL LOW (ref >90)
GFR calc non Af Amer: 54 mL/min — ABNORMAL LOW (ref >90)
GFR, EST AFRICAN AMERICAN: 61 mL/min — AB (ref >90)
GFR, EST NON AFRICAN AMERICAN: 53 mL/min — AB (ref >90)
Glucose, Bld: 108 mg/dL — ABNORMAL HIGH (ref 70–99)
Glucose, Bld: 135 mg/dL — ABNORMAL HIGH (ref 70–99)
POTASSIUM: 4.3 meq/L (ref 3.7–5.3)
Potassium: 6 mEq/L — ABNORMAL HIGH (ref 3.7–5.3)
Sodium: 134 mEq/L — ABNORMAL LOW (ref 137–147)
Sodium: 135 mEq/L — ABNORMAL LOW (ref 137–147)

## 2014-02-13 LAB — GLUCOSE, CAPILLARY
Glucose-Capillary: 159 mg/dL — ABNORMAL HIGH (ref 70–99)
Glucose-Capillary: 167 mg/dL — ABNORMAL HIGH (ref 70–99)

## 2014-02-13 LAB — POCT I-STAT 4, (NA,K, GLUC, HGB,HCT)
Glucose, Bld: 138 mg/dL — ABNORMAL HIGH (ref 70–99)
HCT: 38 % — ABNORMAL LOW (ref 39.0–52.0)
Hemoglobin: 12.9 g/dL — ABNORMAL LOW (ref 13.0–17.0)
Potassium: 5.6 mEq/L — ABNORMAL HIGH (ref 3.7–5.3)
Sodium: 136 mEq/L — ABNORMAL LOW (ref 137–147)

## 2014-02-13 LAB — MAGNESIUM: MAGNESIUM: 1.9 mg/dL (ref 1.5–2.5)

## 2014-02-13 LAB — URIC ACID: URIC ACID, SERUM: 8.3 mg/dL — AB (ref 4.0–7.8)

## 2014-02-13 SURGERY — VIDEO ASSISTED THORACOSCOPY (VATS)/THOROCOTOMY
Anesthesia: Epidural | Site: Chest | Laterality: Right

## 2014-02-13 MED ORDER — EPHEDRINE SULFATE 50 MG/ML IJ SOLN
INTRAMUSCULAR | Status: AC
  Filled 2014-02-13: qty 1

## 2014-02-13 MED ORDER — DEXTROSE-NACL 5-0.9 % IV SOLN
INTRAVENOUS | Status: DC
  Administered 2014-02-13: 100 mL/h via INTRAVENOUS
  Administered 2014-02-19 (×2): via INTRAVENOUS

## 2014-02-13 MED ORDER — ALBUMIN HUMAN 5 % IV SOLN
12.5000 g | Freq: Once | INTRAVENOUS | Status: AC
  Administered 2014-02-13: 12.5 g via INTRAVENOUS

## 2014-02-13 MED ORDER — PHENYLEPHRINE HCL 10 MG/ML IJ SOLN
10.0000 mg | INTRAMUSCULAR | Status: DC | PRN
  Administered 2014-02-13: 20 ug/min via INTRAVENOUS

## 2014-02-13 MED ORDER — LACTATED RINGERS IV SOLN
INTRAVENOUS | Status: DC | PRN
  Administered 2014-02-13 (×2): via INTRAVENOUS

## 2014-02-13 MED ORDER — PROPOFOL 10 MG/ML IV BOLUS
INTRAVENOUS | Status: AC
  Filled 2014-02-13: qty 20

## 2014-02-13 MED ORDER — LIDOCAINE HCL (CARDIAC) 20 MG/ML IV SOLN
INTRAVENOUS | Status: AC
  Filled 2014-02-13: qty 5

## 2014-02-13 MED ORDER — CALCIUM CHLORIDE 10 % IV SOLN
INTRAVENOUS | Status: DC | PRN
  Administered 2014-02-13 (×5): 200 mg via INTRAVENOUS

## 2014-02-13 MED ORDER — FENTANYL CITRATE 0.05 MG/ML IJ SOLN
INTRAMUSCULAR | Status: AC
  Filled 2014-02-13: qty 5

## 2014-02-13 MED ORDER — 0.9 % SODIUM CHLORIDE (POUR BTL) OPTIME
TOPICAL | Status: DC | PRN
  Administered 2014-02-13 (×2): 1000 mL

## 2014-02-13 MED ORDER — ALBUMIN HUMAN 5 % IV SOLN
INTRAVENOUS | Status: AC
  Administered 2014-02-13: 12.5 g via INTRAVENOUS
  Filled 2014-02-13: qty 250

## 2014-02-13 MED ORDER — PROPOFOL 10 MG/ML IV BOLUS
INTRAVENOUS | Status: DC | PRN
  Administered 2014-02-13: 120 mg via INTRAVENOUS

## 2014-02-13 MED ORDER — FENTANYL CITRATE 0.05 MG/ML IJ SOLN
INTRAMUSCULAR | Status: DC | PRN
  Administered 2014-02-13 (×3): 50 ug via INTRAVENOUS

## 2014-02-13 MED ORDER — LIDOCAINE-EPINEPHRINE (PF) 1.5 %-1:200000 IJ SOLN
INTRAMUSCULAR | Status: DC | PRN
  Administered 2014-02-13: 3 mL
  Administered 2014-02-13: 4 mL

## 2014-02-13 MED ORDER — CETYLPYRIDINIUM CHLORIDE 0.05 % MT LIQD
7.0000 mL | Freq: Two times a day (BID) | OROMUCOSAL | Status: DC
Start: 2014-02-13 — End: 2014-02-23
  Administered 2014-02-13 – 2014-02-23 (×15): 7 mL via OROMUCOSAL

## 2014-02-13 MED ORDER — ROPIVACAINE HCL 2 MG/ML IJ SOLN
8.0000 mL/h | INTRAMUSCULAR | Status: DC
Start: 2014-02-13 — End: 2014-02-21
  Administered 2014-02-13 – 2014-02-19 (×8): 8 mL/h via EPIDURAL
  Filled 2014-02-13 (×7): qty 200

## 2014-02-13 MED ORDER — SENNOSIDES-DOCUSATE SODIUM 8.6-50 MG PO TABS
1.0000 | ORAL_TABLET | Freq: Every day | ORAL | Status: DC
  Administered 2014-02-13 – 2014-02-22 (×8): 1 via ORAL
  Filled 2014-02-13 (×11): qty 1

## 2014-02-13 MED ORDER — DOPAMINE-DEXTROSE 3.2-5 MG/ML-% IV SOLN
2.0000 ug/kg/min | INTRAVENOUS | Status: DC
Start: 2014-02-13 — End: 2014-02-17

## 2014-02-13 MED ORDER — ACETAMINOPHEN 500 MG PO TABS
1000.0000 mg | ORAL_TABLET | Freq: Four times a day (QID) | ORAL | Status: AC
  Administered 2014-02-13 – 2014-02-18 (×16): 1000 mg via ORAL
  Filled 2014-02-13 (×19): qty 2

## 2014-02-13 MED ORDER — HEPARIN SODIUM (PORCINE) 5000 UNIT/ML IJ SOLN
5000.0000 [IU] | Freq: Three times a day (TID) | INTRAMUSCULAR | Status: DC
  Administered 2014-02-14 – 2014-02-21 (×22): 5000 [IU] via SUBCUTANEOUS
  Filled 2014-02-13 (×25): qty 1

## 2014-02-13 MED ORDER — SUCCINYLCHOLINE CHLORIDE 20 MG/ML IJ SOLN
INTRAMUSCULAR | Status: AC
  Filled 2014-02-13: qty 1

## 2014-02-13 MED ORDER — ROPIVACAINE HCL 2 MG/ML IJ SOLN
8.0000 mL/h | INTRAMUSCULAR | Status: DC
  Filled 2014-02-13: qty 200

## 2014-02-13 MED ORDER — GLYCOPYRROLATE 0.2 MG/ML IJ SOLN
INTRAMUSCULAR | Status: DC | PRN
  Administered 2014-02-13: .8 mg via INTRAVENOUS

## 2014-02-13 MED ORDER — MAGNESIUM OXIDE 400 (241.3 MG) MG PO TABS
400.0000 mg | ORAL_TABLET | Freq: Two times a day (BID) | ORAL | Status: DC
  Administered 2014-02-14 – 2014-02-23 (×18): 400 mg via ORAL
  Filled 2014-02-13 (×20): qty 1

## 2014-02-13 MED ORDER — ROCURONIUM BROMIDE 100 MG/10ML IV SOLN
INTRAVENOUS | Status: DC | PRN
  Administered 2014-02-13 (×2): 10 mg via INTRAVENOUS
  Administered 2014-02-13: 50 mg via INTRAVENOUS
  Administered 2014-02-13: 20 mg via INTRAVENOUS

## 2014-02-13 MED ORDER — ONDANSETRON HCL 4 MG/2ML IJ SOLN
4.0000 mg | Freq: Four times a day (QID) | INTRAMUSCULAR | Status: DC | PRN
  Administered 2014-02-14 – 2014-02-16 (×3): 4 mg via INTRAVENOUS
  Filled 2014-02-13 (×3): qty 2

## 2014-02-13 MED ORDER — SODIUM CHLORIDE 0.9 % IV SOLN
INTRAVENOUS | Status: DC | PRN
  Administered 2014-02-13: 12:00:00 via INTRAVENOUS

## 2014-02-13 MED ORDER — ONDANSETRON HCL 4 MG/2ML IJ SOLN
INTRAMUSCULAR | Status: AC
  Filled 2014-02-13: qty 2

## 2014-02-13 MED ORDER — SODIUM CHLORIDE 0.9 % IJ SOLN
INTRAMUSCULAR | Status: AC
  Filled 2014-02-13: qty 10

## 2014-02-13 MED ORDER — FENTANYL CITRATE 0.05 MG/ML IJ SOLN
25.0000 ug | INTRAMUSCULAR | Status: DC | PRN
  Administered 2014-02-14 (×2): 50 ug via INTRAVENOUS
  Administered 2014-02-14: 25 ug via INTRAVENOUS
  Administered 2014-02-14 – 2014-02-15 (×5): 50 ug via INTRAVENOUS
  Filled 2014-02-13 (×6): qty 2

## 2014-02-13 MED ORDER — POTASSIUM CHLORIDE 10 MEQ/50ML IV SOLN: 10.0000 meq | Freq: Every day | INTRAVENOUS | Status: DC | PRN

## 2014-02-13 MED ORDER — PANTOPRAZOLE SODIUM 40 MG PO TBEC
40.0000 mg | DELAYED_RELEASE_TABLET | Freq: Every day | ORAL | Status: DC
  Administered 2014-02-13 – 2014-02-23 (×11): 40 mg via ORAL
  Filled 2014-02-13 (×11): qty 1

## 2014-02-13 MED ORDER — PHENYLEPHRINE HCL 10 MG/ML IJ SOLN
0.0000 ug/min | INTRAMUSCULAR | Status: DC
  Administered 2014-02-13: 10 ug/min via INTRAVENOUS
  Filled 2014-02-13: qty 1

## 2014-02-13 MED ORDER — ROCURONIUM BROMIDE 50 MG/5ML IV SOLN
INTRAVENOUS | Status: AC
  Filled 2014-02-13: qty 1

## 2014-02-13 MED ORDER — MILRINONE IN DEXTROSE 20 MG/100ML IV SOLN
INTRAVENOUS | Status: DC | PRN
  Administered 2014-02-13: .245 ug/kg/min via INTRAVENOUS

## 2014-02-13 MED ORDER — ACETAMINOPHEN 160 MG/5ML PO SOLN: 1000.0000 mg | Freq: Four times a day (QID) | ORAL | Status: AC

## 2014-02-13 MED ORDER — OXYCODONE HCL 5 MG PO TABS
5.0000 mg | ORAL_TABLET | ORAL | Status: DC | PRN
  Administered 2014-02-14: 10 mg via ORAL
  Filled 2014-02-13: qty 2

## 2014-02-13 MED ORDER — CEFUROXIME SODIUM 1.5 G IJ SOLR
1.5000 g | Freq: Two times a day (BID) | INTRAMUSCULAR | Status: AC
  Administered 2014-02-13 – 2014-02-14 (×2): 1.5 g via INTRAVENOUS
  Filled 2014-02-13 (×2): qty 1.5

## 2014-02-13 MED ORDER — BISACODYL 5 MG PO TBEC
10.0000 mg | DELAYED_RELEASE_TABLET | Freq: Every day | ORAL | Status: DC
  Administered 2014-02-14 – 2014-02-22 (×8): 10 mg via ORAL
  Filled 2014-02-13 (×8): qty 2

## 2014-02-13 MED ORDER — SODIUM CHLORIDE 0.9 % IV SOLN
INTRAVENOUS | Status: DC | PRN
  Administered 2014-02-13: 07:00:00 via INTRAVENOUS

## 2014-02-13 MED ORDER — MIDAZOLAM HCL 5 MG/5ML IJ SOLN
INTRAMUSCULAR | Status: DC | PRN
  Administered 2014-02-13: 2 mg via INTRAVENOUS

## 2014-02-13 MED ORDER — CARVEDILOL 6.25 MG PO TABS
6.2500 mg | ORAL_TABLET | Freq: Two times a day (BID) | ORAL | Status: DC
  Administered 2014-02-14 – 2014-02-19 (×11): 6.25 mg via ORAL
  Filled 2014-02-13 (×13): qty 1

## 2014-02-13 MED ORDER — FEBUXOSTAT 40 MG PO TABS
80.0000 mg | ORAL_TABLET | Freq: Every day | ORAL | Status: DC
  Administered 2014-02-14 – 2014-02-18 (×5): 80 mg via ORAL
  Filled 2014-02-13 (×6): qty 2

## 2014-02-13 MED ORDER — ROPIVACAINE HCL 2 MG/ML IJ SOLN
INTRAMUSCULAR | Status: DC | PRN
  Administered 2014-02-13: 8 mL/h via EPIDURAL

## 2014-02-13 MED ORDER — INSULIN ASPART 100 UNIT/ML ~~LOC~~ SOLN
0.0000 [IU] | Freq: Four times a day (QID) | SUBCUTANEOUS | Status: DC
  Administered 2014-02-13: 2 [IU] via SUBCUTANEOUS
  Administered 2014-02-14: 4 [IU] via SUBCUTANEOUS
  Administered 2014-02-14 (×2): 2 [IU] via SUBCUTANEOUS
  Administered 2014-02-14: 4 [IU] via SUBCUTANEOUS
  Administered 2014-02-15 – 2014-02-19 (×11): 2 [IU] via SUBCUTANEOUS
  Administered 2014-02-20: 4 [IU] via SUBCUTANEOUS
  Administered 2014-02-20 – 2014-02-22 (×7): 2 [IU] via SUBCUTANEOUS

## 2014-02-13 MED ORDER — PHENYLEPHRINE 40 MCG/ML (10ML) SYRINGE FOR IV PUSH (FOR BLOOD PRESSURE SUPPORT)
PREFILLED_SYRINGE | INTRAVENOUS | Status: AC
  Filled 2014-02-13: qty 10

## 2014-02-13 MED ORDER — PROPOFOL 10 MG/ML IV EMUL
5.0000 ug/kg/min | INTRAVENOUS | Status: DC
  Filled 2014-02-13: qty 100

## 2014-02-13 MED ORDER — HEMOSTATIC AGENTS (NO CHARGE) OPTIME
TOPICAL | Status: DC | PRN
  Administered 2014-02-13 (×3): 1 via TOPICAL

## 2014-02-13 MED ORDER — LACTATED RINGERS IV SOLN
INTRAVENOUS | Status: DC | PRN
  Administered 2014-02-13: 11:00:00 via INTRAVENOUS

## 2014-02-13 MED ORDER — CALCIUM CHLORIDE 10 % IV SOLN
INTRAVENOUS | Status: AC
  Filled 2014-02-13: qty 10

## 2014-02-13 MED ORDER — PHENYLEPHRINE HCL 10 MG/ML IJ SOLN
INTRAMUSCULAR | Status: DC | PRN
  Administered 2014-02-13: 120 ug via INTRAVENOUS

## 2014-02-13 MED ORDER — MIDAZOLAM HCL 2 MG/2ML IJ SOLN
INTRAMUSCULAR | Status: AC
Start: 2014-02-13 — End: 2014-02-13
  Filled 2014-02-13: qty 2

## 2014-02-13 MED ORDER — NEOSTIGMINE METHYLSULFATE 10 MG/10ML IV SOLN
INTRAVENOUS | Status: DC | PRN
  Administered 2014-02-13: 4 mg via INTRAVENOUS

## 2014-02-13 MED ORDER — TRAMADOL HCL 50 MG PO TABS: 50.0000 mg | ORAL_TABLET | Freq: Four times a day (QID) | ORAL | Status: DC | PRN

## 2014-02-13 MED ORDER — ROCURONIUM BROMIDE 50 MG/5ML IV SOLN
INTRAVENOUS | Status: AC
Start: 2014-02-13 — End: 2014-02-13
  Filled 2014-02-13: qty 1

## 2014-02-13 SURGICAL SUPPLY — 90 items
ADH SKN CLS APL DERMABOND .7 (GAUZE/BANDAGES/DRESSINGS)
APPLIER CLIP ROT 10 11.4 M/L (STAPLE) ×12
APR CLP MED LRG 11.4X10 (STAPLE) ×6
BLADE SURG 11 STRL SS (BLADE) IMPLANT
CANISTER SUCTION 2500CC (MISCELLANEOUS) ×8 IMPLANT
CATH KIT ON Q 5IN SLV (PAIN MANAGEMENT) IMPLANT
CATH ROBINSON RED A/P 22FR (CATHETERS) IMPLANT
CATH THORACIC 28FR (CATHETERS) IMPLANT
CATH THORACIC 36FR (CATHETERS) ×4 IMPLANT
CATH THORACIC 36FR RT ANG (CATHETERS) ×4 IMPLANT
CLIP APPLIE ROT 10 11.4 M/L (STAPLE) ×6 IMPLANT
CONN Y 3/8X3/8X3/8  BEN (MISCELLANEOUS) ×2
CONN Y 3/8X3/8X3/8 BEN (MISCELLANEOUS) ×2 IMPLANT
CONT SPEC 4OZ CLIKSEAL STRL BL (MISCELLANEOUS) ×16 IMPLANT
COUNTER NEEDLE 20 DBL MAG RED (NEEDLE) ×4 IMPLANT
COVER SURGICAL LIGHT HANDLE (MISCELLANEOUS) ×4 IMPLANT
DERMABOND ADVANCED (GAUZE/BANDAGES/DRESSINGS)
DERMABOND ADVANCED .7 DNX12 (GAUZE/BANDAGES/DRESSINGS) IMPLANT
DRAIN CHANNEL 32F RND 10.7 FF (WOUND CARE) ×4 IMPLANT
DRAPE LAPAROSCOPIC ABDOMINAL (DRAPES) ×4 IMPLANT
DRAPE WARM FLUID 44X44 (DRAPE) ×8 IMPLANT
ELECT BLADE 6.5 EXT (BLADE) ×4 IMPLANT
ELECT REM PT RETURN 9FT ADLT (ELECTROSURGICAL) ×4
ELECTRODE REM PT RTRN 9FT ADLT (ELECTROSURGICAL) ×2 IMPLANT
GAUZE SPONGE 4X4 12PLY STRL (GAUZE/BANDAGES/DRESSINGS) ×4 IMPLANT
GEL ULTRASOUND 20GR AQUASONIC (MISCELLANEOUS) IMPLANT
GLOVE BIOGEL PI IND STRL 6.5 (GLOVE) ×4 IMPLANT
GLOVE BIOGEL PI IND STRL 7.0 (GLOVE) ×4 IMPLANT
GLOVE BIOGEL PI INDICATOR 6.5 (GLOVE) ×4
GLOVE BIOGEL PI INDICATOR 7.0 (GLOVE) ×4
GLOVE SURG SIGNA 7.5 PF LTX (GLOVE) ×8 IMPLANT
GOWN STRL REUS W/ TWL LRG LVL3 (GOWN DISPOSABLE) ×4 IMPLANT
GOWN STRL REUS W/TWL LRG LVL3 (GOWN DISPOSABLE) ×8
HANDLE STAPLE ENDO GIA SHORT (STAPLE) ×2
HEMOSTAT SURGICEL 2X14 (HEMOSTASIS) ×4 IMPLANT
KIT BASIN OR (CUSTOM PROCEDURE TRAY) ×4 IMPLANT
KIT ROOM TURNOVER OR (KITS) ×4 IMPLANT
KIT SUCTION CATH 14FR (SUCTIONS) ×4 IMPLANT
NS IRRIG 1000ML POUR BTL (IV SOLUTION) ×16 IMPLANT
PACK CHEST (CUSTOM PROCEDURE TRAY) ×4 IMPLANT
PAD ARMBOARD 7.5X6 YLW CONV (MISCELLANEOUS) ×8 IMPLANT
RELOAD EGIA 45 TAN VASC (STAPLE) ×8 IMPLANT
RELOAD EGIA TRIS TAN 45 CVD (STAPLE) ×20 IMPLANT
SEALANT SURG COSEAL 4ML (VASCULAR PRODUCTS) IMPLANT
SOLUTION ANTI FOG 6CC (MISCELLANEOUS) ×4 IMPLANT
SPECIMEN JAR MEDIUM (MISCELLANEOUS) ×4 IMPLANT
SPONGE GAUZE 4X4 12PLY STER LF (GAUZE/BANDAGES/DRESSINGS) ×4 IMPLANT
SPONGE INTESTINAL PEANUT (DISPOSABLE) ×4 IMPLANT
SPONGE LAP 18X18 X RAY DECT (DISPOSABLE) ×16 IMPLANT
SPONGE TONSIL 1.25 RF SGL STRG (GAUZE/BANDAGES/DRESSINGS) ×20 IMPLANT
STAPLER ENDO GIA 12MM SHORT (STAPLE) ×2 IMPLANT
STAPLER VISISTAT 35W (STAPLE) ×8 IMPLANT
SUT BONE WAX W31G (SUTURE) ×4 IMPLANT
SUT CHROMIC 3 0 SH 27 (SUTURE) ×4 IMPLANT
SUT ETHILON 3 0 PS 1 (SUTURE) IMPLANT
SUT PROLENE 3 0 SH DA (SUTURE) ×8 IMPLANT
SUT PROLENE 4 0 RB 1 (SUTURE) ×4
SUT PROLENE 4-0 RB1 .5 CRCL 36 (SUTURE) ×2 IMPLANT
SUT PROLENE 5 0 C1 (SUTURE) ×4 IMPLANT
SUT PROLENE 6 0 C 1 30 (SUTURE) ×8 IMPLANT
SUT SILK  1 MH (SUTURE) ×6
SUT SILK 1 MH (SUTURE) ×6 IMPLANT
SUT SILK 1 TIES 10X30 (SUTURE) IMPLANT
SUT SILK 2 0SH CR/8 30 (SUTURE) ×4 IMPLANT
SUT SILK 3 0SH CR/8 30 (SUTURE) IMPLANT
SUT VIC AB 1 CTX 18 (SUTURE) ×20 IMPLANT
SUT VIC AB 2 TP1 27 (SUTURE) ×4 IMPLANT
SUT VIC AB 2-0 CT1 27 (SUTURE)
SUT VIC AB 2-0 CT1 TAPERPNT 27 (SUTURE) IMPLANT
SUT VIC AB 2-0 CT2 18 VCP726D (SUTURE) IMPLANT
SUT VIC AB 2-0 CTX 36 (SUTURE) ×8 IMPLANT
SUT VIC AB 3-0 MH 27 (SUTURE) IMPLANT
SUT VIC AB 3-0 SH 18 (SUTURE) IMPLANT
SUT VIC AB 3-0 SH 27 (SUTURE)
SUT VIC AB 3-0 SH 27X BRD (SUTURE) IMPLANT
SUT VIC AB 3-0 X1 27 (SUTURE) ×8 IMPLANT
SUT VICRYL 0 UR6 27IN ABS (SUTURE) ×4 IMPLANT
SUT VICRYL 4-0 PS2 18IN ABS (SUTURE) IMPLANT
SWAB COLLECTION DEVICE MRSA (MISCELLANEOUS) IMPLANT
SYSTEM SAHARA CHEST DRAIN ATS (WOUND CARE) ×4 IMPLANT
TAPE CLOTH 4X10 WHT NS (GAUZE/BANDAGES/DRESSINGS) ×4 IMPLANT
TAPE CLOTH SURG 4X10 WHT LF (GAUZE/BANDAGES/DRESSINGS) ×4 IMPLANT
TOWEL OR 17X24 6PK STRL BLUE (TOWEL DISPOSABLE) ×4 IMPLANT
TOWEL OR 17X26 10 PK STRL BLUE (TOWEL DISPOSABLE) ×4 IMPLANT
TRAP SPECIMEN MUCOUS 40CC (MISCELLANEOUS) IMPLANT
TRAY FOLEY CATH 16FRSI W/METER (SET/KITS/TRAYS/PACK) ×4 IMPLANT
TROCAR XCEL BLADELESS 5X75MML (TROCAR) ×4 IMPLANT
TUBE ANAEROBIC SPECIMEN COL (MISCELLANEOUS) IMPLANT
WATER STERILE IRR 1000ML POUR (IV SOLUTION) ×8 IMPLANT
YANKAUER SUCT BULB TIP NO VENT (SUCTIONS) ×8 IMPLANT

## 2014-02-13 NOTE — Brief Op Note (Signed)
02/10/2014 - 02/13/2014  1:12 PM  PATIENT:  [name]  53 y.o. male  PRE-OPERATIVE DIAGNOSIS:  PARAGANGLIOMA of RIGHT CHEST  POST-OPERATIVE DIAGNOSIS:  PARAGANGLIOMA of RIGHT CHEST   PROCEDURE: RIGHT VIDEO ASSISTED THORACOSCOPY (VATS), RIGHT THOROCOTOMY, RESECTION OF RIGHT MEDIASTINAL MASS   PLACEMENT of EPIDURAL CATHETER and SWAN GANZ by Dr. Ermalene Postin  SURGEON:  Surgeon(s) and Role:    * [name], MD - Primary    * Melrose Nakayama, MD - Assisting  PHYSICIAN ASSISTANT: Lars Pinks PA-C  ANESTHESIA: General  EBL:  Total I/O In: 5809 [I.V.:1415; Blood:1872] Out: 3350 [Urine:150; Blood:3200]  BLOOD ADMINISTERED:none  DRAINS: 2 64 French chest tubes and Bard Drain placed in the right pleural space    SPECIMEN:  Source of Specimen:  Right mediastinal mass  DISPOSITION OF SPECIMEN:  PATHOLOGY  COUNTS CORRECT:  YES  DICTATION: .Dragon Dictation  PLAN OF CARE: Admit to inpatient   PATIENT DISPOSITION:  PACU - hemodynamically stable.   Delay start of Pharmacological VTE agent (>24hrs) due to surgical blood loss or risk of bleeding: yes

## 2014-02-13 NOTE — OR Nursing (Signed)
1st call to SICU @ 1225.

## 2014-02-13 NOTE — Progress Notes (Signed)
Able to extubate patient so going to PACU instead of SICU.

## 2014-02-13 NOTE — Progress Notes (Signed)
The patient was examined and preop studies reviewed. There has been no change from the prior exam and the patient is ready for surgery.  Plan Right VATS, thoracotomy on [name] for resection of mediastinal tumor

## 2014-02-13 NOTE — Progress Notes (Signed)
S/p thoracotomy for resection of a paraganglioma  BP 94/55 mmHg  Pulse 75  Temp(Src) 98.3 F (36.8 C) (Oral)  Resp 18  Ht 5\' 10"  (1.778 m)  Wt 253 lb 1.6 oz (114.805 kg)  BMI 36.32 kg/m2  SpO2 99%   Intake/Output Summary (Last 24 hours) at 02/13/14 1724 Last data filed at 02/13/14 1500  Gross per 24 hour  Intake   4432 ml  Output   3565 ml  Net    867 ml    Resting quietly  PA 42/20 CI= 2.5

## 2014-02-13 NOTE — Anesthesia Procedure Notes (Addendum)
Epidural Patient location during procedure: pre-op  Staffing Anesthesiologist: MOSER, CHRIS Performed by: anesthesiologist   Preanesthetic Checklist Completed: patient identified, surgical consent, pre-op evaluation, timeout performed, IV checked, risks and benefits discussed and monitors and equipment checked  Epidural Patient position: sitting Prep: site prepped and draped and DuraPrep Patient monitoring: heart rate, cardiac monitor, continuous pulse ox and blood pressure Approach: midline Location: thoracic (1-12) Injection technique: LOR air  Needle:  Needle type: Tuohy  Needle gauge: 17 G Needle length: 9 cm Needle insertion depth: 9 cm Catheter type: closed end flexible Catheter size: 19 Gauge Catheter at skin depth: 18 cm Test dose: 1.5% lidocaine with Epi 1:200 K and negative  Assessment Events: blood not aspirated, injection not painful, no injection resistance, negative IV test and no paresthesia  Additional Notes H+P and labs checked, risks and benefits discussed with the patient, consent obtained, procedure tolerated well and without complications.  Reason for block:procedure for pain  Procedure Name: Intubation Date/Time: 02/13/2014 9:04 AM Performed by: Carney Living Pre-anesthesia Checklist: Patient identified, Emergency Drugs available, Suction available, Patient being monitored and Timeout performed Patient Re-evaluated:Patient Re-evaluated prior to inductionOxygen Delivery Method: Circle system utilized Preoxygenation: Pre-oxygenation with 100% oxygen Intubation Type: IV induction Ventilation: Mask ventilation without difficulty and Oral airway inserted - appropriate to patient size Laryngoscope Size: Mac and 4 Grade View: Grade II Tube type: Oral Endobronchial tube: Left and EBT position confirmed by fiberoptic bronchoscope and 39 Fr Number of attempts: 1 Airway Equipment and Method: Stylet Placement Confirmation: ETT inserted through vocal  cords under direct vision,  positive ETCO2 and breath sounds checked- equal and bilateral Secured at: 31 cm Tube secured with: Tape Dental Injury: Teeth and Oropharynx as per pre-operative assessment

## 2014-02-13 NOTE — Anesthesia Preprocedure Evaluation (Signed)
Anesthesia Evaluation  Patient identified by MRN, date of birth, ID band Patient awake    Reviewed: Allergy & Precautions, H&P , NPO status , Patient's Chart, lab work & pertinent test results  History of Anesthesia Complications Negative for: history of anesthetic complications  Airway Mallampati: IV  TM Distance: >3 FB Neck ROM: Full    Dental  (+) Teeth Intact   Pulmonary shortness of breath, sleep apnea and Continuous Positive Airway Pressure Ventilation ,  breath sounds clear to auscultation        Cardiovascular hypertension, Pt. on medications and Pt. on home beta blockers + CAD and +CHF - Past MI + Cardiac Defibrillator Rhythm:Regular     Neuro/Psych negative neurological ROS  negative psych ROS   GI/Hepatic negative GI ROS, Neg liver ROS,   Endo/Other  Hypothyroidism Morbid obesity  Renal/GU Renal InsufficiencyRenal disease     Musculoskeletal  (+) Arthritis -,   Abdominal   Peds  Hematology   Anesthesia Other Findings   Reproductive/Obstetrics                             Anesthesia Physical Anesthesia Plan  ASA: IV  Anesthesia Plan: General   Post-op Pain Management:    Induction: Intravenous  Airway Management Planned: Double Lumen EBT  Additional Equipment: Arterial line, CVP, PA Cath and TEE  Intra-op Plan:   Post-operative Plan: Extubation in OR and Possible Post-op intubation/ventilation  Informed Consent: I have reviewed the patients History and Physical, chart, labs and discussed the procedure including the risks, benefits and alternatives for the proposed anesthesia with the patient or authorized representative who has indicated his/her understanding and acceptance.   Dental advisory given  Plan Discussed with: CRNA and Surgeon  Anesthesia Plan Comments:         Anesthesia Quick Evaluation

## 2014-02-13 NOTE — Plan of Care (Signed)
Problem: Phase I Progression Outcomes Goal: Hemodynamically stable Outcome: Completed/Met Date Met:  02/13/14 Goal: Chest tube drainage < 200 cc/hr Outcome: Completed/Met Date Met:  02/13/14 Goal: O2 sats > or equal to 88% with O2 Outcome: Completed/Met Date Met:  02/13/14 Goal: Pain controlled with appropriate interventions Outcome: Completed/Met Date Met:  02/13/14 Goal: If Diabetic, blood sugar < 150 Outcome: Completed/Met Date Met:  02/13/14

## 2014-02-13 NOTE — Transfer of Care (Signed)
Immediate Anesthesia Transfer of Care Note  Patient: [name]  Procedure(s) Performed: Procedure(s) with comments: VIDEO ASSISTED THORACOSCOPY (VATS)/THOROCOTOMY (Right) - PATIENT NEEDS EPIDURAL CATHETER (DR. CHRIS MOSER AWARE PER PVT) RESECTION OF MEDIASTINAL MASS (Right) - PATIENT NEEDS EPIDURAL CATHETER AND A SWAN GANZ CATHETER (DR. CHRIS MOSER AWARE PER PVT)  Patient Location: PACU  Anesthesia Type:General  Level of Consciousness: awake, alert , oriented and patient cooperative  Airway & Oxygen Therapy: Patient Spontanous Breathing and Patient connected to face mask oxygen  Post-op Assessment: Report given to PACU RN, Post -op Vital signs reviewed and stable and Patient moving all extremities X 4  Post vital signs: Reviewed and stable  Complications: No apparent anesthesia complications

## 2014-02-14 ENCOUNTER — Inpatient Hospital Stay (HOSPITAL_COMMUNITY): Payer: 59

## 2014-02-14 ENCOUNTER — Encounter (HOSPITAL_COMMUNITY): Payer: Self-pay | Admitting: Cardiothoracic Surgery

## 2014-02-14 LAB — POCT I-STAT, CHEM 8
BUN: 18 mg/dL (ref 6–23)
BUN: 20 mg/dL (ref 6–23)
BUN: 24 mg/dL — ABNORMAL HIGH (ref 6–23)
Calcium, Ion: 1.11 mmol/L — ABNORMAL LOW (ref 1.12–1.23)
Calcium, Ion: 1.15 mmol/L (ref 1.12–1.23)
Calcium, Ion: 1.26 mmol/L — ABNORMAL HIGH (ref 1.12–1.23)
Chloride: 104 mEq/L (ref 96–112)
Chloride: 106 mEq/L (ref 96–112)
Chloride: 100 mEq/L (ref 96–112)
Creatinine, Ser: 1.2 mg/dL (ref 0.50–1.35)
Creatinine, Ser: 1.3 mg/dL (ref 0.50–1.35)
Creatinine, Ser: 1.5 mg/dL — ABNORMAL HIGH (ref 0.50–1.35)
Glucose, Bld: 683 mg/dL (ref 70–99)
Glucose, Bld: >700 mg/dL (ref 70–99)
Glucose, Bld: 282 mg/dL — ABNORMAL HIGH (ref 70–99)
HCT: 29 % — ABNORMAL LOW (ref 39.0–52.0)
HCT: 30 % — ABNORMAL LOW (ref 39.0–52.0)
HCT: 33 % — ABNORMAL LOW (ref 39.0–52.0)
Hemoglobin: 10.2 g/dL — ABNORMAL LOW (ref 13.0–17.0)
Hemoglobin: 9.9 g/dL — ABNORMAL LOW (ref 13.0–17.0)
Hemoglobin: 11.2 g/dL — ABNORMAL LOW (ref 13.0–17.0)
Potassium: 3.7 mEq/L (ref 3.7–5.3)
Potassium: 3.9 mEq/L (ref 3.7–5.3)
Potassium: 4.4 mEq/L (ref 3.7–5.3)
Sodium: 140 mEq/L (ref 137–147)
Sodium: 141 mEq/L (ref 137–147)
Sodium: 137 mEq/L (ref 137–147)
TCO2: 17 mmol/L (ref 0–100)
TCO2: 17 mmol/L (ref 0–100)
TCO2: 19 mmol/L (ref 0–100)

## 2014-02-14 LAB — POCT I-STAT 3, ART BLOOD GAS (G3+)
Acid-base deficit: 3 mmol/L — ABNORMAL HIGH (ref 0.0–2.0)
Bicarbonate: 21.8 mEq/L (ref 20.0–24.0)
O2 Saturation: 96 %
Patient temperature: 100.2
TCO2: 23 mmol/L (ref 0–100)
pCO2 arterial: 38.7 mmHg (ref 35.0–45.0)
pH, Arterial: 7.362 (ref 7.350–7.450)
pO2, Arterial: 89 mmHg (ref 80.0–100.0)

## 2014-02-14 LAB — CBC
HEMATOCRIT: 32.7 % — AB (ref 39.0–52.0)
Hemoglobin: 11.3 g/dL — ABNORMAL LOW (ref 13.0–17.0)
MCH: 29.4 pg (ref 26.0–34.0)
MCHC: 34.6 g/dL (ref 30.0–36.0)
MCV: 84.9 fL (ref 78.0–100.0)
Platelets: 105 10*3/uL — ABNORMAL LOW (ref 150–400)
RBC: 3.85 MIL/uL — ABNORMAL LOW (ref 4.22–5.81)
RDW: 14.1 % (ref 11.5–15.5)
WBC: 15.7 10*3/uL — AB (ref 4.0–10.5)

## 2014-02-14 LAB — BLOOD GAS, ARTERIAL
ACID-BASE DEFICIT: 1.7 mmol/L (ref 0.0–2.0)
Bicarbonate: 23.3 mEq/L (ref 20.0–24.0)
O2 Content: 6 L/min
O2 Saturation: 99.1 %
PCO2 ART: 43.2 mmHg (ref 35.0–45.0)
PO2 ART: 176 mmHg — AB (ref 80.0–100.0)
Patient temperature: 97.3
TCO2: 24.7 mmol/L (ref 0–100)
pH, Arterial: 7.347 — ABNORMAL LOW (ref 7.350–7.450)

## 2014-02-14 LAB — PREPARE FRESH FROZEN PLASMA
UNIT DIVISION: 0
UNIT DIVISION: 0

## 2014-02-14 LAB — CARBOXYHEMOGLOBIN
Carboxyhemoglobin: 1.8 % — ABNORMAL HIGH (ref 0.5–1.5)
Methemoglobin: 0.8 % (ref 0.0–1.5)
O2 Saturation: 62.4 %
Total hemoglobin: 10.9 g/dL — ABNORMAL LOW (ref 13.5–18.0)

## 2014-02-14 LAB — BASIC METABOLIC PANEL
Anion gap: 13 (ref 5–15)
BUN: 26 mg/dL — AB (ref 6–23)
CHLORIDE: 101 meq/L (ref 96–112)
CO2: 22 meq/L (ref 19–32)
Calcium: 8.5 mg/dL (ref 8.4–10.5)
Creatinine, Ser: 1.6 mg/dL — ABNORMAL HIGH (ref 0.50–1.35)
GFR calc Af Amer: 55 mL/min — ABNORMAL LOW (ref >90)
GFR calc non Af Amer: 47 mL/min — ABNORMAL LOW (ref >90)
Glucose, Bld: 155 mg/dL — ABNORMAL HIGH (ref 70–99)
POTASSIUM: 4.8 meq/L (ref 3.7–5.3)
Sodium: 136 mEq/L — ABNORMAL LOW (ref 137–147)

## 2014-02-14 LAB — GLUCOSE, CAPILLARY
Glucose-Capillary: 143 mg/dL — ABNORMAL HIGH (ref 70–99)
Glucose-Capillary: 171 mg/dL — ABNORMAL HIGH (ref 70–99)

## 2014-02-14 LAB — BLOOD PRODUCT ORDER (VERBAL) VERIFICATION

## 2014-02-14 MED ORDER — ALBUMIN HUMAN 5 % IV SOLN
12.5000 g | Freq: Once | INTRAVENOUS | Status: AC
  Administered 2014-02-14: 12.5 g via INTRAVENOUS
  Filled 2014-02-14: qty 250

## 2014-02-14 MED ORDER — DEXTROSE 5 % IV SOLN
1.0000 g | Freq: Two times a day (BID) | INTRAVENOUS | Status: DC
  Administered 2014-02-14 – 2014-02-16 (×4): 1 g via INTRAVENOUS
  Filled 2014-02-14 (×5): qty 1

## 2014-02-14 MED ORDER — FUROSEMIDE 10 MG/ML IJ SOLN
40.0000 mg | Freq: Once | INTRAMUSCULAR | Status: AC
  Administered 2014-02-14: 40 mg via INTRAVENOUS
  Filled 2014-02-14: qty 4

## 2014-02-14 MED ORDER — FUROSEMIDE 10 MG/ML IJ SOLN
5.0000 mg/h | INTRAVENOUS | Status: DC
  Administered 2014-02-14: 5 mg/h via INTRAVENOUS
  Filled 2014-02-14: qty 25

## 2014-02-14 NOTE — Progress Notes (Signed)
CT surgery p.m. Rounds  Patient out of bed to chair Maintaining good O2 sats ration Minimal serous drainage from chest tubes Hemodynamic stable on low-dose milrinone Urine output improving on Lasix drip-creatinine 1.5 this p.m., hematocrit 33%

## 2014-02-14 NOTE — Op Note (Signed)
NAMEMarland Kitchen  ADRIAN, DINOVO NO.:  [id number]  MEDICAL RECORD NO.:  [id number]  LOCATION:  2S16C                        FACILITY:  [facility]  PHYSICIAN:  [name], M.D.  DATE OF BIRTH:  1960/12/16  DATE OF PROCEDURE:  02/13/2014 DATE OF DISCHARGE:                              OPERATIVE REPORT   OPERATION: 1. Right VATS (video-assisted thoracoscopic surgery). 2  Right thoracotomy, resection of 9-cm paraganglioma from the central- superior mediastinum.  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  SURGEON:  [name], MD  ASSISTING SURGEON:  Revonda Standard. Roxan Hockey, MD  ASSISTANT:  Lars Pinks, PA-C  ANESTHESIA:  General by Dr. Lendon Ka.  INDICATIONS:  The patient is a 53 year old obese male with idiopathic cardiomyopathy, ejection fraction 15-20% followed by the Advanced Heart Failure Service who presents today for resection of a biopsy-proven paraganglioma which has been expanding in the central-superior mediastinum over the past several years and has caused significant tracheal compression.  The patient was prepared and evaluated by Advanced Heart Failure Cardiology Service, Renal Service, and was admitted for preoperative cardiac preparation with intravenous milrinone to optimize his cardiac status prior to surgery.  I have discussed the procedure in detail with the patient in the office setting including the indications, benefits, and alternatives to surgery.  The patient understands this is a high risk procedure due to his poor cardiac function and his chronic renal failure.  He understands the use of general anesthesia, the plan to use an epidural anesthetic, the plan for a thoracotomy incision to provide adequate  exposure, and the possibility of requiring blood product transfusion during surgery for bleeding.  The patient understands the other risks of this procedure to potentially include stroke, prolonged ventilator dependence, cardiogenic shock, acute on chronic renal failure requiring dialysis, infection, or death. After reviewing these issues, he demonstrated his understanding and agreed to proceed with surgery under what I felt was an informed consent.  OPERATIVE FINDINGS: 1. A standard posterolateral right thoracotomy incision was made in     order to provide adequate exposure. 2. The mass was firmly bound to the medial mediastinum including the     superior vena cava,  the azygos vein, and the right upper lobe.     The mass was firmly bound posteriorly to the esophagus and the     paravertebral column. 3. The mass was excised with a dissection margin around the tumor     laterally and posteriorly and it was dissected off the superior     vena cava in a lateral plane of dissection and then a separate     segment was dissected off with a more superior plane of dissection     off the vena cava. 4. Mediastinal lymph nodes were also dissected and submitted for final     pathology. 5. The mass was 9 cm, very firm, but with significant vascularity, the     large veins connected to the capsule of the tumor.  The patient     required  2 units of packed cells during the surgery.  OPERATIVE PROCEDURE:  The patient was prepared in the preoperative area with an epidural catheter for local anesthesia and a Swan-Ganz catheter for hemodynamic monitoring.  The patient was then brought to the operating room and placed supine on the operating table.  General anesthesia was induced without any difficulty.  The patient was intubated with a double-lumen endotracheal tube and then positioned right side up in the lateral position.  The patient was prepped and draped as a sterile field.  A proper time-out was  performed.  A small incision was made at the tip of the scapula and the VATS camera was inserted.  Due to the large size of the mass, there was very difficult visualization as the mass pushed the collapsed lung almost to the lateral extent of the chest wall.  The camera was removed and the incision was revised for standard posterolateral thoracotomy.  The chest was entered in the fourth interspace and the fifth rib was divided posteriorly.  Two retractors were required to retract the chest wall which was extremely thick due to the patient's obesity of over 250 pounds.  Assessment of the mass showed it to be fixed into the mediastinum.  This was a difficult and tedious dissection.  It was made more difficult by the vascularity of the tumor with several large veins extending from the capsule to the surrounding mediastinal structures.  First, a plane of dissection was developed inferiorly after the right upper lobe was dissected off the anterior surface and rotated medially. Large vein inferiorly was stapled and divided.  This was a chest wall vein.  Dissection was then taken up posteriorly and laterally.  The azygos vein was identified and was involved with the tumor and this was stapled and divided.  After tedious dissection, a plane was developed. The tumor was carefully dissected off the esophagus posteriorly.  We then turned attention more to the anterior aspect across the superior vena cava.  The right main pulmonary artery was protected at the hilum and a plane of dissection was developed posterolateral to the superior vena cava.  Some tumor overlapped the anterior aspect of the vena cava and this was resected at a separate time with a more anterior plane of dissection off the superior vena cava.  Just lateral to this plane, we developed a new plane of dissection along the surface of the trachea, and the tumor was dissected without injuring the trachea as it was removed from the  esophagus without injury to the esophagus.  This left a small amount of tumor going superiorly and posterior to the superior vena cava, and this was then dissected off the surrounding mediastinal fat and the tumor was removed en bloc.  A large collection of lymph nodes in the area of the paratracheal region was removed en bloc as well after a lymph node dissection.  The second portion of the tumor around the anterior aspect of the superior vena cava was then dissected off with an anterior dissection plane being developed without injury to the superior vena cava or to the innominate artery.  There was some superficial pleural laceration to the right upper lobe and this was repaired with some interrupted Vicryl sutures and covered with a biologic adhesive-CoSeal.  There was diffuse bleeding from the mediastinum from the paravertebral area above the hilum up towards the vena cava but no large vessel bleeding.  After considerable time with packing the area with electrocautery and suture translation of  some bleeding tissue as well as some ligature clips, adequate hemostasis of the operative field was achieved.  Three chest tubes were placed to drain the anterior middle and posterior right pleural space and brought out through separate incisions.  The right lung was then re-expanded under direct vision and expanded well without obvious air leak.  The chest wall was then closed with interrupted figure-of-eight #2 Vicryl pericostal sutures.  The muscular layers of the chest wall were closed with interrupted #1 Vicryl sutures.  The subcutaneous layer was closed with a running 2-0 Vicryl, and the skin was closed with skin staples.  The chest tubes were connected to an underwater seal Pleur-Evac suction and sterile dressings were applied.  The patient was then turned supine.  He was then extubated and observe by Anesthesia.  The epidural anesthesia appeared to be working very well.  The  patient was breathing comfortably and denied any significant pain.  The patient was then transferred to the recovery room for further careful monitoring.     [name], M.D.     PV/MEDQ  D:  02/13/2014  T:  02/14/2014  Job:  [id number]

## 2014-02-14 NOTE — Progress Notes (Signed)
  [name] is POD #1 s/p right thoracotomy with resection of a peritracheal mass managed post operatively with a T6/7 epidural. Patient endorses adequate pain control with epidural and denies back pain, head ache, numbness or weakness of his upper or lower extremities.  Blood pressure 117/69, pulse 83, temperature 37.1 C, temperature source Core (Comment), resp. rate 24, height 5\' 10"  (1.778 m), weight 265 lb 3.4 oz (120.3 kg), SpO2 97 %.  A+O RRR CTAB with dressing over right chest, chest tube Epidural site with scan old heme at insertion site, no erythema or tenderness to palpation  A/P: 53 yo M s/p right thoracotomy with resection of peritracheal mass. Continue epidural catheter with 0.2% Ropivacaine at 50ml/hr until chest tube removed. Consider scheduled tylenol and prn Oxycodone for breakthrough pain and anticipation of transition from epidural pain control.   Oleta Mouse, MD 02/14/2014 11:44 AM

## 2014-02-14 NOTE — Anesthesia Postprocedure Evaluation (Addendum)
  Anesthesia Post-op Note  Patient: [name]  Procedure(s) Performed: Procedure(s) with comments: VIDEO ASSISTED THORACOSCOPY (VATS)/THOROCOTOMY (Right) - PATIENT NEEDS EPIDURAL CATHETER (DR. CHRIS Glennis Montenegro AWARE PER PVT) RESECTION OF MEDIASTINAL MASS (Right) - PATIENT NEEDS EPIDURAL CATHETER AND A SWAN GANZ CATHETER (DR. CHRIS Mattalynn Crandle AWARE PER PVT)  Patient Location: ICU  Anesthesia Type:General and epidural  Level of Consciousness: awake and alert   Airway and Oxygen Therapy: Patient Spontanous Breathing and Patient connected to nasal cannula oxygen  Post-op Pain: mild, right back along chest tube insertion site  Post-op Assessment: Post-op Vital signs reviewed, Patient's Cardiovascular Status Stable, Respiratory Function Stable, Patent Airway, No signs of Nausea or vomiting and Pain level controlled  Post-op Vital Signs: Reviewed and stable  Last Vitals:  Filed Vitals:   02/14/14 1035  BP:   Pulse: 83  Temp:   Resp:     Complications: No apparent anesthesia complications

## 2014-02-14 NOTE — Progress Notes (Signed)
1 Day Post-Op Procedure(s) (LRB): VIDEO ASSISTED THORACOSCOPY (VATS)/THOROCOTOMY (Right) RESECTION OF MEDIASTINAL MASS (Right) Subjective: Breathing comfortably CXR clear Urine 30 cc/hr- creat sl up CT  Without air leak  Objective: Vital signs in last 24 hours: Temp:  [97.3 F (36.3 C)-101.5 F (38.6 C)] 98.6 F (37 C) (12/11 0715) Pulse Rate:  [75-97] 86 (12/11 0715) Cardiac Rhythm:  [-] A-V Sequential paced (12/11 0000) Resp:  [10-30] 20 (12/11 0715) BP: (74-133)/(28-79) 118/66 mmHg (12/11 0700) SpO2:  [95 %-100 %] 99 % (12/11 0715) Arterial Line BP: (84-161)/(51-88) 134/64 mmHg (12/11 0715) Weight:  [265 lb 3.4 oz (120.3 kg)] 265 lb 3.4 oz (120.3 kg) (12/11 0418)  Hemodynamic parameters for last 24 hours: PAP: (35-85)/(17-48) 43/21 mmHg CVP:  [0 mmHg-17 mmHg] 1 mmHg CO:  [3.3 L/min-8.7 L/min] 6.2 L/min CI:  [1.5 L/min/m2-3.9 L/min/m2] 2.7 L/min/m2  Intake/Output from previous day: 12/10 0701 - 12/11 0700 In: 6174.7 [I.V.:4002.7; Blood:1872; IV Piggyback:300] Out: 0092 [Urine:875; Blood:3200; Chest Tube:475] Intake/Output this shift:    Lungs clear  Lab Results:  Recent Labs  02/12/14 1750  02/13/14 1739 02/14/14 0420  WBC 9.7  --   --  15.7*  HGB 15.7  < > 12.9* 11.3*  HCT 46.0  < > 38.0* 32.7*  PLT 153  --   --  105*  < > = values in this interval not displayed. BMET:  Recent Labs  02/13/14 1600 02/13/14 1739 02/14/14 0420  NA 135* 136* 136*  K 6.0* 5.6* 4.8  CL 102  --  101  CO2 22  --  22  GLUCOSE 135* 138* 155*  BUN 26*  --  26*  CREATININE 1.43*  --  1.60*  CALCIUM 9.0  --  8.5    PT/INR:  Recent Labs  02/12/14 1750  LABPROT 14.4  INR 1.10   ABG    Component Value Date/Time   PHART 7.362 02/14/2014 0422   HCO3 21.8 02/14/2014 0422   TCO2 23 02/14/2014 0422   ACIDBASEDEF 3.0* 02/14/2014 0422   O2SAT 96.0 02/14/2014 0422   CBG (last 3)   Recent Labs  02/13/14 1457 02/13/14 1950 02/13/14 2337  GLUCAP 159* 167* 171*     Assessment/Plan: S/P Procedure(s) (LRB): VIDEO ASSISTED THORACOSCOPY (VATS)/THOROCOTOMY (Right) RESECTION OF MEDIASTINAL MASS (Right) Cont milrinone DC swan- OOB to chair Cont epidural for pain- workong very well   LOS: 4 days    [name],PETER 02/14/2014

## 2014-02-14 NOTE — Progress Notes (Signed)
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    He has had stable NYHA III symptoms. Was admitted several months for a [place]procedure to biopsy the mass. Post-op he developed low-output HF a/b acute renal failure requiring milrinone support. He was admitted 12/7 for pre-operative optimization prior to sternotomy for mass excision 12/10.  Underwent VATs with resection of mediastinal mass (12/11). Doing well. Remains on milrinone 0.25 mcg. Feels ok. Chest sore but feels pain is controlled well. No co-ox this am.  Renal function slightly up, 1.6. Weight up 10 pounds   Objective:   Weight Range:  Vital Signs:   Temp:  [98.2 F (36.8 C)-101.5 F (38.6 C)] 98.4 F (36.9 C) (12/11 1150) Pulse Rate:  [75-97] 83 (12/11 1035) Resp:  [10-30] 24 (12/11 0800) BP: (74-133)/(28-79) 117/69 mmHg (12/11 0841) SpO2:  [95 %-100 %] 97 % (12/11 0800) Arterial Line BP: (84-161)/(55-88) 117/64 mmHg (12/11 0800) Weight:  [265 lb 3.4 oz (120.3 kg)] 265 lb 3.4 oz (120.3 kg) (12/11 0418) Last BM Date: 02/12/14  Weight change: Filed Weights   02/11/14 0422 02/13/14 0601 02/14/14 0418  Weight: 253 lb 1.4 oz (114.8 kg) 253 lb 1.6 oz (114.805 kg) 265 lb 3.4 oz (120.3 kg)    Intake/Output:   Intake/Output Summary (Last 24 hours) at 02/14/14 1415 Last data filed at 02/14/14 1200  Gross per 24 hour  Intake 2828.42 ml  Output   1370 ml  Net 1458.42 ml     Physical Exam: General: Sitting in chair. No resp difficulty HEENT: normal Neck: supple. RIJ sheath  Cor: PMI laterally displaced. Regular rate & rhythm. No rubs, gallops or murmurs. Lungs: clear Abdomen: soft, nontender, + distended. Hypoactivebowel  sounds. Extremities: no cyanosis, clubbing, rash, mild edema Neuro: alert & orientedx3, cranial nerves grossly intact. moves all 4 extremities w/o difficulty. Affect pleasant  Telemetry: SR with v pacing  Labs: Basic Metabolic Panel:  Recent Labs Lab 02/10/14 1755  02/12/14 0600 02/12/14 1750 02/13/14 0500 02/13/14 1100 02/13/14 1156 02/13/14 1600 02/13/14 1739 02/14/14 0420  NA 137  < > 138 137 134* 134* 132* 135* 136* 136*  K 4.5  < > 4.5 4.4 4.3 5.2 6.1* 6.0* 5.6* 4.8  CL 98  < > 100 99 97  --   --  102  --  101  CO2 24  < > 24 24 23   --   --  22  --  22  GLUCOSE 133*  < > 122* 142* 108*  --   --  135* 138* 155*  BUN 21  < > 29* 27* 25*  --   --  26*  --  26*  CREATININE 1.33  < > 1.84* 1.57* 1.46*  --   --  1.43*  --  1.60*  CALCIUM 9.9  < > 9.2 9.2 9.4  --   --  9.0  --  8.5  MG 1.6  --   --  2.0 1.9  --   --   --   --   --   < > = values in this interval not displayed.  Liver Function Tests:  Recent Labs Lab 02/10/14 1755 02/12/14 1750  AST 19 16  ALT 22 18  ALKPHOS 75 72  BILITOT 0.7 1.1  PROT 8.1 7.7  ALBUMIN 3.7 3.5   No results for input(s): LIPASE, AMYLASE in the last 168 hours. No results for input(s): AMMONIA in the last 168 hours.  CBC:  Recent Labs Lab 02/10/14 1755 02/12/14 1750 02/13/14 1100 02/13/14 1156 02/13/14 1739 02/14/14 0420  WBC 9.6 9.7  --   --   --  15.7*  HGB 17.4* 15.7 15.3 14.6 12.9* 11.3*  HCT 50.6 46.0 45.0 43.0 38.0* 32.7*  MCV 83.6 83.2  --   --   --  84.9  PLT 160 153  --   --   --  105*    Cardiac Enzymes: No results for input(s): CKTOTAL, CKMB, CKMBINDEX, TROPONINI in the last 168 hours.  BNP: BNP (last 3 results)  Recent Labs  01/20/14 1414 02/10/14 1755  PROBNP 519.4* 653.7*     Other results:    Imaging: Dg Chest Port 1 View  02/14/2014   CLINICAL DATA:  Evaluate chest tubes.  EXAM: PORTABLE CHEST - 1 VIEW  COMPARISON:  02/13/2014  FINDINGS: Swan-Ganz catheter tip in the proximal right  pulmonary artery region. There are 3 right chest tubes which appear stable in position. No evidence for a large pneumothorax. Again noted is a left cardiac ICD. Densities at the left lung base. Patchy densities at the right lung base. These basilar lung densities are new or have progressed. There is also a right jugular central venous catheter buy the tip is difficult to evaluate due to other support apparatuses.  IMPRESSION: Increased basilar chest densities bilaterally. Findings could represent atelectasis. Difficult to exclude left pleural fluid.  Stable position of the right chest tubes. No evidence for a large pneumothorax.  Support apparatuses as described.   Electronically Signed   By: Markus Daft M.D.   On: 02/14/2014 08:03   Dg Chest Port 1 View  02/13/2014   CLINICAL DATA:  Postop  EXAM: PORTABLE CHEST - 1 VIEW  COMPARISON:  02/11/2014 and 02/10/2014  FINDINGS: Cardiomegaly again noted. The patient is status post resection of right paratracheal mass. Three leads cardiac pacemaker is unchanged in position. No infiltrate or pulmonary edema. Right IJ Swan-Ganz catheter in place. Three right chest tubes are noted. Lateral skin staples are noted right chest wall. There is no right pneumothorax. Surgical clips are noted in right suprahilar region.  IMPRESSION: The patient is status post resection of right paratracheal mass. Three leads cardiac pacemaker is unchanged in position. No infiltrate or pulmonary edema. Right IJ Swan-Ganz catheter in place. Three right chest tubes are noted. Lateral skin staples are noted right chest wall. There is no right pneumothorax.   Electronically Signed   By: Lahoma Crocker M.D.   On: 02/13/2014 14:16     Medications:     Scheduled Medications: . acetaminophen  1,000 mg Oral 4 times per day   Or  . acetaminophen (TYLENOL) oral liquid 160 mg/5 mL  1,000 mg Oral 4 times per day  . antiseptic oral rinse  7 mL Mouth Rinse BID  . atorvastatin  40 mg Oral q1800  .  bisacodyl  10 mg Oral Daily  . carvedilol  6.25 mg Oral BID WC  . digoxin  0.125 mg Oral Daily  . febuxostat  80 mg Oral QHS  . heparin  5,000 Units Subcutaneous 3 times per day  . insulin aspart  0-24 Units Subcutaneous 4 times per day  . levothyroxine  150 mcg Oral QAC breakfast  . magnesium oxide  400 mg Oral BID  . pantoprazole  40 mg Oral Daily  . senna-docusate  1 tablet Oral QHS  . tobramycin-dexamethasone  2 drop Right Eye BID    Infusions: . dextrose 5 % and 0.9% NaCl 50 mL/hr (02/14/14 0841)  . DOPamine Stopped (02/13/14 1705)  . milrinone 0.25 mcg/kg/min (02/14/14 0418)  . phenylephrine (NEO-SYNEPHRINE) Adult infusion Stopped (02/14/14 0200)  . ropivacaine (PF) 2 mg/ml (0.2%) 8 mL/hr (02/14/14 0417)    PRN Medications: fentaNYL, ondansetron (ZOFRAN) IV, oxyCODONE, potassium chloride, traMADol   Assessment:   1. Chronic systolic HF due to NICM  --with episode of low output HF requiring milrinone support this summer 2. 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 Acute on chronic renal failure, stage III 4. Hypomagenesemia 5. Polycythemia  Plan/Discussion:    Did well with surgery. Does have some mild post-op volume overload. Will continue milrinone. Start lasix gtt at 5. Will set up CVP and co-ox monitoring.   HF team will follow.   Vedika Dumlao,MD 2:41 PM

## 2014-02-14 NOTE — Progress Notes (Signed)
UR Completed.  [phone]  

## 2014-02-14 NOTE — Progress Notes (Signed)
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   Subjective:  S/p VATS and Thoracotomy w/ resection of paraganglioma yesterday Extubated Only on milrinone Good UOP  Low grade fever o/n, now defervesced  12/10 0701 - 12/11 0700 In: 6174.7 [I.V.:4002.7; Blood:1872; IV Piggyback:300] Out: 5573 [Urine:875; Blood:3200; Chest Tube:475]  Filed Weights   02/11/14 0422 02/13/14 0601 02/14/14 0418  Weight: 114.8 kg (253 lb 1.4 oz) 114.805 kg (253 lb 1.6 oz) 120.3 kg (265 lb 3.4 oz)    Scheduled Meds: . acetaminophen  1,000 mg Oral 4 times per day   Or  . acetaminophen (TYLENOL) oral liquid 160 mg/5 mL  1,000 mg Oral 4 times per day  . albumin human  12.5 g Intravenous Once  . antiseptic oral rinse  7 mL Mouth Rinse BID  . atorvastatin  40 mg Oral q1800  . bisacodyl  10 mg Oral Daily  . carvedilol  6.25 mg Oral BID WC  . cefUROXime (ZINACEF)  IV  1.5 g Intravenous Q12H  . digoxin  0.125 mg Oral Daily  . febuxostat  80 mg Oral QHS  . heparin  5,000 Units Subcutaneous 3 times per day  . insulin aspart  0-24 Units Subcutaneous 4 times per day  . levothyroxine  150 mcg Oral QAC breakfast  . magnesium oxide  400 mg Oral BID  . pantoprazole  40 mg Oral Daily  . senna-docusate  1 tablet Oral QHS  . tobramycin-dexamethasone  2 drop Right Eye BID   Continuous Infusions: . dextrose 5 % and 0.9% NaCl 100 mL/hr at 02/13/14 2200  . DOPamine Stopped (02/13/14 1705)  . milrinone 0.25 mcg/kg/min (02/14/14 0418)  . phenylephrine (NEO-SYNEPHRINE) Adult infusion Stopped (02/14/14 0200)  . ropivacaine (PF) 2 mg/ml (0.2%) 8 mL/hr (02/14/14 0417)   PRN Meds:.fentaNYL, ondansetron (ZOFRAN) IV, oxyCODONE, potassium chloride, traMADol  Current Labs: reviewed    Physical Exam:  Blood pressure 118/66, pulse 86, temperature 98.6 F (37 C), temperature source Oral, resp.  rate 20, height 5\' 10"  (1.778 m), weight 120.3 kg (265 lb 3.4 oz), SpO2 99 %. NAD RRR, nl s1s2 CTAB, ant S/nt/nd.  Nonfocal. AAOx3,  No LEE  A/P 1. CKD3 1. 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 BL SCr 1.4; follows with [facility] 3. Stable POD #1, adequate UOP 4. Cont to follow 5. On milrinone 2. CHF, systolic, chronic 1. AHF following 2. Milrinone gtt, other adjunctive meds 3. Paraganglioma of Chest, structural impingement 1. VATS/Thoracotomy 12/10 w/ resection  Pearson Grippe MD 02/14/2014, 8:39 AM   Recent Labs Lab 02/13/14 0500  02/13/14 1600 02/13/14 1739 02/14/14 0420  NA 134*  < > 135* 136* 136*  K 4.3  < > 6.0* 5.6* 4.8  CL 97  --  102  --  101  CO2 23  --  22  --  22  GLUCOSE 108*  --  135* 138* 155*  BUN 25*  --  26*  --  26*  CREATININE 1.46*  --  1.43*  --  1.60*  CALCIUM 9.4  --  9.0  --  8.5  < > = values in this interval not displayed.  Recent Labs Lab 02/10/14 1755 02/12/14 1750  02/13/14 1156 02/13/14 1739 02/14/14 0420  WBC 9.6 9.7  --   --   --  15.7*  HGB 17.4* 15.7  < > 14.6 12.9* 11.3*  HCT 50.6 46.0  < >  43.0 38.0* 32.7*  MCV 83.6 83.2  --   --   --  84.9  PLT 160 153  --   --   --  105*  < > = values in this interval not displayed.

## 2014-02-15 ENCOUNTER — Inpatient Hospital Stay (HOSPITAL_COMMUNITY): Payer: 59

## 2014-02-15 LAB — CBC
HCT: 30.6 % — ABNORMAL LOW (ref 39.0–52.0)
Hemoglobin: 10.5 g/dL — ABNORMAL LOW (ref 13.0–17.0)
MCH: 28.6 pg (ref 26.0–34.0)
MCHC: 34.3 g/dL (ref 30.0–36.0)
MCV: 83.4 fL (ref 78.0–100.0)
Platelets: 118 10*3/uL — ABNORMAL LOW (ref 150–400)
RBC: 3.67 MIL/uL — ABNORMAL LOW (ref 4.22–5.81)
RDW: 14 % (ref 11.5–15.5)
WBC: 20 10*3/uL — ABNORMAL HIGH (ref 4.0–10.5)

## 2014-02-15 LAB — GLUCOSE, CAPILLARY
Glucose-Capillary: 131 mg/dL — ABNORMAL HIGH (ref 70–99)
Glucose-Capillary: 131 mg/dL — ABNORMAL HIGH (ref 70–99)
Glucose-Capillary: 136 mg/dL — ABNORMAL HIGH (ref 70–99)
Glucose-Capillary: 139 mg/dL — ABNORMAL HIGH (ref 70–99)
Glucose-Capillary: 165 mg/dL — ABNORMAL HIGH (ref 70–99)

## 2014-02-15 LAB — COMPREHENSIVE METABOLIC PANEL
ALT: 13 U/L (ref 0–53)
AST: 36 U/L (ref 0–37)
Albumin: 3 g/dL — ABNORMAL LOW (ref 3.5–5.2)
Alkaline Phosphatase: 67 U/L (ref 39–117)
Anion gap: 15 (ref 5–15)
BUN: 24 mg/dL — ABNORMAL HIGH (ref 6–23)
CO2: 22 mEq/L (ref 19–32)
Calcium: 8.8 mg/dL (ref 8.4–10.5)
Chloride: 100 mEq/L (ref 96–112)
Creatinine, Ser: 1.97 mg/dL — ABNORMAL HIGH (ref 0.50–1.35)
GFR calc Af Amer: 43 mL/min — ABNORMAL LOW (ref >90)
GFR calc non Af Amer: 37 mL/min — ABNORMAL LOW (ref >90)
Glucose, Bld: 139 mg/dL — ABNORMAL HIGH (ref 70–99)
Potassium: 4.7 mEq/L (ref 3.7–5.3)
Sodium: 137 mEq/L (ref 137–147)
Total Bilirubin: 1.1 mg/dL (ref 0.3–1.2)
Total Protein: 6.2 g/dL (ref 6.0–8.3)

## 2014-02-15 LAB — CARBOXYHEMOGLOBIN
Carboxyhemoglobin: 1.7 % — ABNORMAL HIGH (ref 0.5–1.5)
Methemoglobin: 1.1 % (ref 0.0–1.5)
O2 Saturation: 64.6 %
Total hemoglobin: 10.9 g/dL — ABNORMAL LOW (ref 13.5–18.0)

## 2014-02-15 MED ORDER — FENTANYL 75 MCG/HR TD PT72
75.0000 ug | MEDICATED_PATCH | TRANSDERMAL | Status: DC
  Administered 2014-02-15: 75 ug via TRANSDERMAL
  Filled 2014-02-15: qty 1

## 2014-02-15 MED ORDER — METOCLOPRAMIDE HCL 5 MG/ML IJ SOLN
10.0000 mg | Freq: Four times a day (QID) | INTRAMUSCULAR | Status: AC
  Administered 2014-02-15 – 2014-02-16 (×7): 10 mg via INTRAVENOUS
  Filled 2014-02-15 (×9): qty 2

## 2014-02-15 MED ORDER — FUROSEMIDE 10 MG/ML IJ SOLN
40.0000 mg | Freq: Two times a day (BID) | INTRAMUSCULAR | Status: DC
  Administered 2014-02-15 – 2014-02-16 (×3): 40 mg via INTRAVENOUS
  Filled 2014-02-15 (×5): qty 4

## 2014-02-15 MED ORDER — FENTANYL CITRATE 0.05 MG/ML IJ SOLN
25.0000 ug | INTRAMUSCULAR | Status: DC | PRN
  Administered 2014-02-15 – 2014-02-19 (×2): 50 ug via INTRAVENOUS
  Filled 2014-02-15 (×2): qty 2

## 2014-02-15 NOTE — Progress Notes (Signed)
SUBJECTIVE: The patient is making slow but steady progress.  His SOB is stable.  At this time, he denies chest pain or any new concerns.  Marland Kitchen acetaminophen  1,000 mg Oral 4 times per day   Or  . acetaminophen (TYLENOL) oral liquid 160 mg/5 mL  1,000 mg Oral 4 times per day  . antiseptic oral rinse  7 mL Mouth Rinse BID  . atorvastatin  40 mg Oral q1800  . bisacodyl  10 mg Oral Daily  . carvedilol  6.25 mg Oral BID WC  . cefTAZidime (FORTAZ)  IV  1 g Intravenous Q12H  . digoxin  0.125 mg Oral Daily  . febuxostat  80 mg Oral QHS  . fentaNYL  75 mcg Transdermal Q72H  . furosemide  40 mg Intravenous BID  . heparin  5,000 Units Subcutaneous 3 times per day  . insulin aspart  0-24 Units Subcutaneous 4 times per day  . levothyroxine  150 mcg Oral QAC breakfast  . magnesium oxide  400 mg Oral BID  . metoCLOPramide (REGLAN) injection  10 mg Intravenous 4 times per day  . pantoprazole  40 mg Oral Daily  . senna-docusate  1 tablet Oral QHS  . tobramycin-dexamethasone  2 drop Right Eye BID   . dextrose 5 % and 0.9% NaCl 10 mL/hr at 02/14/14 2300  . DOPamine Stopped (02/13/14 1705)  . milrinone 0.25 mcg/kg/min (02/14/14 2300)  . ropivacaine (PF) 2 mg/ml (0.2%) 8 mL/hr (02/15/14 0212)    OBJECTIVE: Physical Exam: Filed Vitals:   02/15/14 0900 02/15/14 1000 02/15/14 1100 02/15/14 1200  BP: 134/69 121/54 119/68 116/76  Pulse: 102 98 96 97  Temp:      TempSrc:      Resp: 24 25 27 20   Height:      Weight:      SpO2: 96% 94% 94% 100%    Intake/Output Summary (Last 24 hours) at 02/15/14 1209 Last data filed at 02/15/14 1200  Gross per 24 hour  Intake 1547.23 ml  Output   2945 ml  Net -1397.77 ml    Telemetry reveals sinus rhythm with V pacing  GEN- The patient is ill appearing, alert and oriented x 3 today.   Head- normocephalic, atraumatic Eyes-  Sclera clear, conjunctiva pink Ears- hearing intact Oropharynx- clear Neck- supple, + elevated JVP Lungs- decreased BS  throughout Heart- Regular rate and rhythm  GI- soft, NT, ND, + BS Extremities- no clubbing, cyanosis, + dependant edema Skin- no rash or lesion Psych- euthymic mood, full affect Neuro- strength and sensation are intact  LABS: Basic Metabolic Panel:  Recent Labs  02/12/14 1750 02/13/14 0500  02/14/14 0420  02/14/14 1636 02/15/14 0508  NA 137 134*  < > 136*  < > 137 137  K 4.4 4.3  < > 4.8  < > 4.4 4.7  CL 99 97  < > 101  < > 100 100  CO2 24 23  < > 22  --   --  22  GLUCOSE 142* 108*  < > 155*  < > 282* 139*  BUN 27* 25*  < > 26*  < > 24* 24*  CREATININE 1.57* 1.46*  < > 1.60*  < > 1.50* 1.97*  CALCIUM 9.2 9.4  < > 8.5  --   --  8.8  MG 2.0 1.9  --   --   --   --   --   < > = values in this interval not displayed. Liver Function  Tests:  Recent Labs  02/12/14 1750 02/15/14 0508  AST 16 36  ALT 18 13  ALKPHOS 72 67  BILITOT 1.1 1.1  PROT 7.7 6.2  ALBUMIN 3.5 3.0*   No results for input(s): LIPASE, AMYLASE in the last 72 hours. CBC:  Recent Labs  02/14/14 0420  02/14/14 1636 02/15/14 0508  WBC 15.7*  --   --  20.0*  HGB 11.3*  < > 11.2* 10.5*  HCT 32.7*  < > 33.0* 30.6*  MCV 84.9  --   --  83.4  PLT 105*  --   --  118*  < > = values in this interval not displayed.  ASSESSMENT AND PLAN:  Active Problems:   SYSTOLIC HEART FAILURE, CHRONIC   Paraganglioma   Acute renal failure superimposed on stage 3 chronic kidney disease   Hypomagnesemia  1. Chronic systolic HF due to NICM  --stable with milrinone, CoX 64, CVP 8    -- lasix drip converted to IV lasix BID this am.  Will need to follow urine output closely 2. 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 Acute on chronic renal failure, stage III    -- nephrology is on board 4. Hypomagenesemia- replete 5. Polycythemia  No new recs Call with questions  Thompson Grayer, MD 02/15/2014 12:09 PM

## 2014-02-15 NOTE — Evaluation (Addendum)
Patient sitting up in chair. No complaints presently. Epidural site looks ok with good pain relief as discussed briefly with nurse.  Will continue epidural for today. Will folllow. Finis Bud, MD Anesthesia

## 2014-02-15 NOTE — Progress Notes (Signed)
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   Subjective:  Stable in past 24h Remains on milrinone gtt Placed on lasix gtt, then changed to BID dosing this AM Great UOP SCr slightly up  12/11 0701 - 12/12 0700 In: 1736.4 [P.O.:600; I.V.:1086.4; IV Piggyback:50] Out: 2275 [Urine:1865; Chest Tube:410]  Filed Weights   02/13/14 0601 02/14/14 0418 02/15/14 0600  Weight: 114.805 kg (253 lb 1.6 oz) 120.3 kg (265 lb 3.4 oz) 117.981 kg (260 lb 1.6 oz)    Scheduled Meds: . acetaminophen  1,000 mg Oral 4 times per day   Or  . acetaminophen (TYLENOL) oral liquid 160 mg/5 mL  1,000 mg Oral 4 times per day  . antiseptic oral rinse  7 mL Mouth Rinse BID  . atorvastatin  40 mg Oral q1800  . bisacodyl  10 mg Oral Daily  . carvedilol  6.25 mg Oral BID WC  . cefTAZidime (FORTAZ)  IV  1 g Intravenous Q12H  . digoxin  0.125 mg Oral Daily  . febuxostat  80 mg Oral QHS  . fentaNYL  75 mcg Transdermal Q72H  . furosemide  40 mg Intravenous BID  . heparin  5,000 Units Subcutaneous 3 times per day  . insulin aspart  0-24 Units Subcutaneous 4 times per day  . levothyroxine  150 mcg Oral QAC breakfast  . magnesium oxide  400 mg Oral BID  . metoCLOPramide (REGLAN) injection  10 mg Intravenous 4 times per day  . pantoprazole  40 mg Oral Daily  . senna-docusate  1 tablet Oral QHS  . tobramycin-dexamethasone  2 drop Right Eye BID   Continuous Infusions: . dextrose 5 % and 0.9% NaCl 10 mL/hr at 02/14/14 2300  . DOPamine Stopped (02/13/14 1705)  . milrinone 0.25 mcg/kg/min (02/14/14 2300)  . ropivacaine (PF) 2 mg/ml (0.2%) 8 mL/hr (02/15/14 0212)   PRN Meds:.fentaNYL, ondansetron (ZOFRAN) IV, potassium chloride  Current Labs: reviewed    Physical Exam:  Blood pressure 121/54, pulse 98, temperature 100.2 F (37.9 C), temperature source Axillary, resp. rate 25,  height 5\' 10"  (1.778 m), weight 117.981 kg (260 lb 1.6 oz), SpO2 94 %. NAD RRR, nl s1s2 CTAB, ant S/nt/nd.  Nonfocal. AAOx3,  No LEE  A/P 1. CKD3 1. 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 BL SCr 1.4; follows with [facility] 3. Stable POD #2, adequate UOP; Some uptick in SCr 4. Cont to follow 5. On milrinone and lasix BID 2. CHF, systolic, chronic 1. AHF following 2. Milrinone gtt, other adjunctive meds 3. Paraganglioma of Chest, structural impingement 1. VATS/Thoracotomy 12/10 w/ resection  Pearson Grippe MD 02/15/2014, 10:23 AM   Recent Labs Lab 02/13/14 1600  02/14/14 0420  02/14/14 1617 02/14/14 1636 02/15/14 0508  NA 135*  < > 136*  < > 140 137 137  K 6.0*  < > 4.8  < > 3.9 4.4 4.7  CL 102  --  101  < > 104 100 100  CO2 22  --  22  --   --   --  22  GLUCOSE 135*  < > 155*  < > 683* 282* 139*  BUN 26*  --  26*  < > 20 24* 24*  CREATININE 1.43*  --  1.60*  < > 1.30 1.50* 1.97*  CALCIUM 9.0  --  8.5  --   --   --  8.8  < > = values  in this interval not displayed.  Recent Labs Lab 02/12/14 1750  02/14/14 0420  02/14/14 1617 02/14/14 1636 02/15/14 0508  WBC 9.7  --  15.7*  --   --   --  20.0*  HGB 15.7  < > 11.3*  < > 10.2* 11.2* 10.5*  HCT 46.0  < > 32.7*  < > 30.0* 33.0* 30.6*  MCV 83.2  --  84.9  --   --   --  83.4  PLT 153  --  105*  --   --   --  118*  < > = values in this interval not displayed.

## 2014-02-15 NOTE — Progress Notes (Signed)
Fentanyl patch removed per pt request, Der, VanTrigt notified, witness by Rachel Moulds RN,  placed in locked bedside sharps

## 2014-02-15 NOTE — Progress Notes (Signed)
CT surgery p.m. Rounds  Out of bed in chair resting comfortably Urine output adequate 40-50 cc/h Hemodynamic stable on low-dose milrinone Physical therapy consult pending

## 2014-02-15 NOTE — Progress Notes (Signed)
2 Days Post-Op Procedure(s) (LRB): VIDEO ASSISTED THORACOSCOPY (VATS)/THOROCOTOMY (Right) RESECTION OF MEDIASTINAL MASS (Right) Subjective: OOB to chair Chest drainage low Creat cont to rise CO-ox great on Milrinone Objective: Vital signs in last 24 hours: Temp:  [98.4 F (36.9 C)-100.7 F (38.2 C)] 100.2 F (37.9 C) (12/12 0700) Pulse Rate:  [79-108] 96 (12/12 0800) Cardiac Rhythm:  [-] A-V Sequential paced (12/12 0700) Resp:  [17-33] 23 (12/12 0800) BP: (93-137)/(48-91) 118/76 mmHg (12/12 0800) SpO2:  [97 %-100 %] 98 % (12/12 0800) Weight:  [260 lb 1.6 oz (117.981 kg)] 260 lb 1.6 oz (117.981 kg) (12/12 0600)  Hemodynamic parameters for last 24 hours: CVP:  [2 mmHg-60 mmHg] 8 mmHg  Intake/Output from previous day: 12/11 0701 - 12/12 0700 In: 1736.4 [P.O.:600; I.V.:1086.4; IV Piggyback:50] Out: 2275 [Urine:1865; Chest Tube:410] Intake/Output this shift: Total I/O In: 33.6 [I.V.:33.6] Out: 30 [Urine:30]  No air leak  Lab Results:  Recent Labs  02/14/14 0420  02/14/14 1636 02/15/14 0508  WBC 15.7*  --   --  20.0*  HGB 11.3*  < > 11.2* 10.5*  HCT 32.7*  < > 33.0* 30.6*  PLT 105*  --   --  118*  < > = values in this interval not displayed. BMET:  Recent Labs  02/14/14 0420  02/14/14 1636 02/15/14 0508  NA 136*  < > 137 137  K 4.8  < > 4.4 4.7  CL 101  < > 100 100  CO2 22  --   --  22  GLUCOSE 155*  < > 282* 139*  BUN 26*  < > 24* 24*  CREATININE 1.60*  < > 1.50* 1.97*  CALCIUM 8.5  --   --  8.8  < > = values in this interval not displayed.  PT/INR:  Recent Labs  02/12/14 1750  LABPROT 14.4  INR 1.10   ABG    Component Value Date/Time   PHART 7.362 02/14/2014 0422   HCO3 21.8 02/14/2014 0422   TCO2 19 02/14/2014 1636   ACIDBASEDEF 3.0* 02/14/2014 0422   O2SAT 64.6 02/15/2014 0625   CBG (last 3)   Recent Labs  02/13/14 2337 02/14/14 1759 02/14/14 2355  GLUCAP 171* 143* 131*    Assessment/Plan: S/P Procedure(s) (LRB): VIDEO ASSISTED  THORACOSCOPY (VATS)/THOROCOTOMY (Right) RESECTION OF MEDIASTINAL MASS (Right) DC ant chest tube DC lasix drip- chg to IV BID   LOS: 5 days    [name],PETER 02/15/2014

## 2014-02-16 ENCOUNTER — Inpatient Hospital Stay (HOSPITAL_COMMUNITY): Payer: 59

## 2014-02-16 LAB — TYPE AND SCREEN
ABO/RH(D): A POS
Antibody Screen: NEGATIVE
Unit division: 0
Unit division: 0
Unit division: 0
Unit division: 0
Unit division: 0
Unit division: 0
Unit division: 0
Unit division: 0

## 2014-02-16 LAB — GLUCOSE, CAPILLARY
Glucose-Capillary: 122 mg/dL — ABNORMAL HIGH (ref 70–99)
Glucose-Capillary: 115 mg/dL — ABNORMAL HIGH (ref 70–99)
Glucose-Capillary: 126 mg/dL — ABNORMAL HIGH (ref 70–99)
Glucose-Capillary: 147 mg/dL — ABNORMAL HIGH (ref 70–99)

## 2014-02-16 LAB — COMPREHENSIVE METABOLIC PANEL
ALT: 14 U/L (ref 0–53)
AST: 30 U/L (ref 0–37)
Albumin: 2.7 g/dL — ABNORMAL LOW (ref 3.5–5.2)
Alkaline Phosphatase: 63 U/L (ref 39–117)
Anion gap: 13 (ref 5–15)
BUN: 29 mg/dL — ABNORMAL HIGH (ref 6–23)
CO2: 22 mEq/L (ref 19–32)
Calcium: 8.5 mg/dL (ref 8.4–10.5)
Chloride: 95 mEq/L — ABNORMAL LOW (ref 96–112)
Creatinine, Ser: 1.94 mg/dL — ABNORMAL HIGH (ref 0.50–1.35)
GFR calc Af Amer: 43 mL/min — ABNORMAL LOW (ref >90)
GFR calc non Af Amer: 37 mL/min — ABNORMAL LOW (ref >90)
Glucose, Bld: 94 mg/dL (ref 70–99)
Potassium: 4.6 mEq/L (ref 3.7–5.3)
Sodium: 130 mEq/L — ABNORMAL LOW (ref 137–147)
Total Bilirubin: 0.8 mg/dL (ref 0.3–1.2)
Total Protein: 6.1 g/dL (ref 6.0–8.3)

## 2014-02-16 LAB — CBC
HCT: 29.1 % — ABNORMAL LOW (ref 39.0–52.0)
Hemoglobin: 9.7 g/dL — ABNORMAL LOW (ref 13.0–17.0)
MCH: 27.9 pg (ref 26.0–34.0)
MCHC: 33.3 g/dL (ref 30.0–36.0)
MCV: 83.6 fL (ref 78.0–100.0)
Platelets: 132 10*3/uL — ABNORMAL LOW (ref 150–400)
RBC: 3.48 MIL/uL — ABNORMAL LOW (ref 4.22–5.81)
RDW: 14 % (ref 11.5–15.5)
WBC: 18.9 10*3/uL — ABNORMAL HIGH (ref 4.0–10.5)

## 2014-02-16 LAB — URIC ACID: Uric Acid, Serum: 7.6 mg/dL (ref 4.0–7.8)

## 2014-02-16 MED ORDER — DEXTROSE 5 % IV SOLN
1.0000 g | Freq: Three times a day (TID) | INTRAVENOUS | Status: DC
  Administered 2014-02-16 – 2014-02-23 (×21): 1 g via INTRAVENOUS
  Filled 2014-02-16 (×27): qty 1

## 2014-02-16 MED ORDER — HYDROMORPHONE HCL 2 MG PO TABS
2.0000 mg | ORAL_TABLET | ORAL | Status: DC | PRN
Start: 2014-02-16 — End: 2014-02-19

## 2014-02-16 MED ORDER — FUROSEMIDE 10 MG/ML IJ SOLN: 80.0000 mg | Freq: Two times a day (BID) | INTRAMUSCULAR | Status: DC

## 2014-02-16 MED ORDER — FUROSEMIDE 10 MG/ML IJ SOLN
40.0000 mg | Freq: Two times a day (BID) | INTRAMUSCULAR | Status: DC
  Administered 2014-02-16: 40 mg via INTRAVENOUS
  Filled 2014-02-16 (×2): qty 4

## 2014-02-16 MED ORDER — LACTULOSE 10 GM/15ML PO SOLN
30.0000 g | Freq: Every day | ORAL | Status: AC
  Administered 2014-02-16: 30 g via ORAL
  Filled 2014-02-16 (×2): qty 45

## 2014-02-16 MED ORDER — DIGOXIN 125 MCG PO TABS
0.1250 mg | ORAL_TABLET | ORAL | Status: DC
  Administered 2014-02-17 – 2014-02-23 (×4): 0.125 mg via ORAL
  Filled 2014-02-16 (×5): qty 1

## 2014-02-16 NOTE — Progress Notes (Signed)
SUBJECTIVE: The patient is making slow but steady progress.  His SOB is stable.  At this time, he denies chest pain or any new concerns today.  Marland Kitchen acetaminophen  1,000 mg Oral 4 times per day   Or  . acetaminophen (TYLENOL) oral liquid 160 mg/5 mL  1,000 mg Oral 4 times per day  . antiseptic oral rinse  7 mL Mouth Rinse BID  . atorvastatin  40 mg Oral q1800  . bisacodyl  10 mg Oral Daily  . carvedilol  6.25 mg Oral BID WC  . cefTAZidime (FORTAZ)  IV  1 g Intravenous Q12H  . [START ON 02/17/2014] digoxin  0.125 mg Oral Q48H  . febuxostat  80 mg Oral QHS  . furosemide  40 mg Intravenous BID  . heparin  5,000 Units Subcutaneous 3 times per day  . insulin aspart  0-24 Units Subcutaneous 4 times per day  . lactulose  30 g Oral Daily  . levothyroxine  150 mcg Oral QAC breakfast  . magnesium oxide  400 mg Oral BID  . metoCLOPramide (REGLAN) injection  10 mg Intravenous 4 times per day  . pantoprazole  40 mg Oral Daily  . senna-docusate  1 tablet Oral QHS  . tobramycin-dexamethasone  2 drop Right Eye BID   . dextrose 5 % and 0.9% NaCl 10 mL/hr at 02/16/14 1000  . DOPamine Stopped (02/13/14 1705)  . milrinone 0.25 mcg/kg/min (02/16/14 1000)  . ropivacaine (PF) 2 mg/ml (0.2%) 8 mL/hr (02/16/14 0351)    OBJECTIVE: Physical Exam: Filed Vitals:   02/16/14 0700 02/16/14 0800 02/16/14 0900 02/16/14 1000  BP: 130/76 125/79 119/80 120/84  Pulse: 90 87 86 86  Temp: 98.1 F (36.7 C)     TempSrc: Oral     Resp: 27 22 20 25   Height:      Weight:      SpO2: 92% 96% 95% 95%    Intake/Output Summary (Last 24 hours) at 02/16/14 1102 Last data filed at 02/16/14 1000  Gross per 24 hour  Intake 1157.8 ml  Output   1670 ml  Net -512.2 ml    Telemetry reveals sinus rhythm with V pacing  GEN- The patient is ill appearing, alert and oriented x 3 today.  Sitting in chair Head- normocephalic, atraumatic Eyes-  Sclera clear, conjunctiva pink Ears- hearing intact Oropharynx- clear Neck-  supple, + elevated JVP Lungs- decreased BS left upper (all other fields have improved) Heart- Regular rate and rhythm  GI- soft, NT, ND, + BS Extremities- no clubbing, cyanosis, + dependant edema Skin- no rash or lesion Psych- euthymic mood, full affect Neuro- strength and sensation are intact  LABS: Basic Metabolic Panel:  Recent Labs  02/15/14 0508 02/16/14 0545  NA 137 130*  K 4.7 4.6  CL 100 95*  CO2 22 22  GLUCOSE 139* 94  BUN 24* 29*  CREATININE 1.97* 1.94*  CALCIUM 8.8 8.5   Liver Function Tests:  Recent Labs  02/15/14 0508 02/16/14 0545  AST 36 30  ALT 13 14  ALKPHOS 67 63  BILITOT 1.1 0.8  PROT 6.2 6.1  ALBUMIN 3.0* 2.7*   No results for input(s): LIPASE, AMYLASE in the last 72 hours. CBC:  Recent Labs  02/15/14 0508 02/16/14 0545  WBC 20.0* 18.9*  HGB 10.5* 9.7*  HCT 30.6* 29.1*  MCV 83.4 83.6  PLT 118* 132*    ASSESSMENT AND PLAN:  Active Problems:   SYSTOLIC HEART FAILURE, CHRONIC   Paraganglioma   Acute  renal failure superimposed on stage 3 chronic kidney disease   Hypomagnesemia  1. Chronic systolic HF due to NICM  --stable with milrinone, cannot aspirate from CVL for coox    -- consider weaning milrinone 2. 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 Acute on chronic renal failure, stage III    -- nephrology is on board 4. Hypomagenesemia- replete 5. Polycythemia  No new recs Call with questions  Thompson Grayer, MD 02/16/2014 11:02 AM

## 2014-02-16 NOTE — Progress Notes (Addendum)
3 Days Post-Op Procedure(s) (LRB): VIDEO ASSISTED THORACOSCOPY (VATS)/THOROCOTOMY (Right) RESECTION OF MEDIASTINAL MASS (Right) Subjective: Continues to have excellent pain control Stable BP- cont mil Creat up tp 1.9, I/O about even Hb stable CT drainage  decreased-- will DC one drain today Objective: Vital signs in last 24 hours: Temp:  [97.2 F (36.2 C)-102.6 F (39.2 C)] 98.1 F (36.7 C) (12/13 0700) Pulse Rate:  [79-98] 87 (12/13 0800) Cardiac Rhythm:  [-] A-V Sequential paced (12/13 0758) Resp:  [15-33] 22 (12/13 0800) BP: (92-132)/(51-91) 125/79 mmHg (12/13 0800) SpO2:  [90 %-100 %] 96 % (12/13 0800) Weight:  [258 lb 2.5 oz (117.1 kg)] 258 lb 2.5 oz (117.1 kg) (12/13 0600)  Hemodynamic parameters for last 24 hours:  stable  Intake/Output from previous day: 12/12 0701 - 12/13 0700 In: 1151.4 [P.O.:480; I.V.:571.4; IV Piggyback:100] Out: 2130 [Urine:2070; Chest Tube:60] Intake/Output this shift: Total I/O In: 18.6 [I.V.:18.6] Out: 20 [Urine:20]  2+ edema Lungs clear  Lab Results:  Recent Labs  02/15/14 0508 02/16/14 0545  WBC 20.0* 18.9*  HGB 10.5* 9.7*  HCT 30.6* 29.1*  PLT 118* 132*   BMET:  Recent Labs  02/15/14 0508 02/16/14 0545  NA 137 130*  K 4.7 4.6  CL 100 95*  CO2 22 22  GLUCOSE 139* 94  BUN 24* 29*  CREATININE 1.97* 1.94*  CALCIUM 8.8 8.5    PT/INR: No results for input(s): LABPROT, INR in the last 72 hours. ABG    Component Value Date/Time   PHART 7.362 02/14/2014 0422   HCO3 21.8 02/14/2014 0422   TCO2 19 02/14/2014 1636   ACIDBASEDEF 3.0* 02/14/2014 0422   O2SAT 64.6 02/15/2014 0625   CBG (last 3)   Recent Labs  02/15/14 1220 02/15/14 1812 02/15/14 2359  GLUCAP 131* 136* 122*    Assessment/Plan: S/P Procedure(s) (LRB): VIDEO ASSISTED THORACOSCOPY (VATS)/THOROCOTOMY (Right) RESECTION OF MEDIASTINAL MASS (Right) Adv diet Cont lasix 40 bid, check uric acid Cont epidural   LOS: 6 days    VAN TRIGT  III,Loretha Ure 02/16/2014

## 2014-02-16 NOTE — Progress Notes (Signed)
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   Subjective:  Stable GFR Great UOP on BID lasix Weight improved No c/o, pain controlled  12/12 0701 - 12/13 0700 In: 1151.4 [P.O.:480; I.V.:571.4; IV Piggyback:100] Out: 2130 [Urine:2070; Chest Tube:60]  Filed Weights   02/14/14 0418 02/15/14 0600 02/16/14 0600  Weight: 120.3 kg (265 lb 3.4 oz) 117.981 kg (260 lb 1.6 oz) 117.1 kg (258 lb 2.5 oz)    Scheduled Meds: . acetaminophen  1,000 mg Oral 4 times per day   Or  . acetaminophen (TYLENOL) oral liquid 160 mg/5 mL  1,000 mg Oral 4 times per day  . antiseptic oral rinse  7 mL Mouth Rinse BID  . atorvastatin  40 mg Oral q1800  . bisacodyl  10 mg Oral Daily  . carvedilol  6.25 mg Oral BID WC  . cefTAZidime (FORTAZ)  IV  1 g Intravenous Q12H  . digoxin  0.125 mg Oral Daily  . febuxostat  80 mg Oral QHS  . fentaNYL  75 mcg Transdermal Q72H  . furosemide  40 mg Intravenous BID  . heparin  5,000 Units Subcutaneous 3 times per day  . insulin aspart  0-24 Units Subcutaneous 4 times per day  . levothyroxine  150 mcg Oral QAC breakfast  . magnesium oxide  400 mg Oral BID  . metoCLOPramide (REGLAN) injection  10 mg Intravenous 4 times per day  . pantoprazole  40 mg Oral Daily  . senna-docusate  1 tablet Oral QHS  . tobramycin-dexamethasone  2 drop Right Eye BID   Continuous Infusions: . dextrose 5 % and 0.9% NaCl 10 mL/hr at 02/16/14 0800  . DOPamine Stopped (02/13/14 1705)  . milrinone 0.25 mcg/kg/min (02/16/14 0800)  . ropivacaine (PF) 2 mg/ml (0.2%) 8 mL/hr (02/16/14 0351)   PRN Meds:.fentaNYL, ondansetron (ZOFRAN) IV, potassium chloride  Current Labs: reviewed    Physical Exam:  Blood pressure 130/76, pulse 87, temperature 98.1 F (36.7 C), temperature source Oral, resp. rate 22, height 5\' 10"  (1.778 m), weight 117.1 kg (258 lb 2.5 oz), SpO2 96  %. NAD, sitting in chair with CPAP on RRR, nl s1s2 CTAB, ant S/nt/nd.  Nonfocal. AAOx3,  No LEE  A/P 1. CKD3 1. 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 BL SCr 1.4; follows with [facility] 3. Stable POD #3, adequate UOP; Stable, but slightly inc SCr 4. Cont to follow 5. On milrinone and lasix BID 6. If any further uptick in SCr would hold lasix / reduce frequency  2. CHF, systolic, chronic 1. AHF following 2. Milrinone gtt, other adjunctive meds 3. Appears compensated 3. Paraganglioma of Chest, structural impingement 1. VATS/Thoracotomy 12/10 w/ resection  Pearson Grippe MD 02/16/2014, 8:12 AM   Recent Labs Lab 02/14/14 0420  02/14/14 1636 02/15/14 0508 02/16/14 0545  NA 136*  < > 137 137 130*  K 4.8  < > 4.4 4.7 4.6  CL 101  < > 100 100 95*  CO2 22  --   --  22 22  GLUCOSE 155*  < > 282* 139* 94  BUN 26*  < > 24* 24* 29*  CREATININE 1.60*  < > 1.50* 1.97* 1.94*  CALCIUM 8.5  --   --  8.8 8.5  < > = values in this interval not displayed.  Recent Labs Lab 02/14/14 0420  02/14/14 1636 02/15/14 0508 02/16/14 0545  WBC 15.7*  --   --  20.0* 18.9*  HGB 11.3*  < > 11.2* 10.5* 9.7*  HCT 32.7*  < > 33.0* 30.6* 29.1*  MCV 84.9  --   --  83.4 83.6  PLT 105*  --   --  118* 132*  < > = values in this interval not displayed.

## 2014-02-16 NOTE — Evaluation (Signed)
Physical Therapy Evaluation Patient Details Name: [name] MRN: [id number] DOB: [birth year] Today's Date: 02/16/2014   History of Present Illness  53 y.o. male s/p VATS prcedure with removal of mediasintal mass on Rt. History of non ischemic cardiomyopathy (LVEF 58%), chronic systolic CHF, CKD, and paroxysmal VT s/p AICD.  Clinical Impression  Patient is seen following the above procedure and presents with functional limitations due to the deficits listed below (see PT Problem List). Ambulates up to 150 feet today with Min guard assist while pushing a rolling walker. VSS throughout therapy session. Min assist for transfers. Pt very motivated to work with therapy and eager to return home when able. Patient will benefit from skilled PT to increase their independence and safety with mobility to allow discharge to the venue listed below.       Follow Up Recommendations Home health PT;Supervision/Assistance - 24 hour    Equipment Recommendations  Rolling walker with 5" wheels;3in1 (PT)    Recommendations for Other Services OT consult     Precautions / Restrictions Precautions Precautions: Fall;Other (comment);ICD/Pacemaker (chest tube) Restrictions Weight Bearing Restrictions: No      Mobility  Bed Mobility                  Transfers Overall transfer level: Needs assistance Equipment used: Pushed w/c Transfers: Sit to/from Stand Sit to Stand: Min assist;+2 safety/equipment         General transfer comment: Min assist for boost to stand from reclining chair. Second person available for safety. VC for hand placment. Good control with descent into chair.  Ambulation/Gait Ambulation/Gait assistance: Min guard;+2 safety/equipment Ambulation Distance (Feet): 150 Feet Assistive device:  (Pushed wheelchair) Gait Pattern/deviations: Step-through pattern;Decreased stride length;Trunk flexed Gait velocity: decreased   General Gait Details: Frequent VC for upright  posture with forward gaze. Pt did not lose balance during bout or require a standing rest break. Became very mildy dyspneic towards end of bout but SpO2 remained > 93% on room air during therapy sesion.  Stairs            Wheelchair Mobility    Modified Rankin (Stroke Patients Only)       Balance Overall balance assessment: Needs assistance Sitting-balance support: No upper extremity supported;Feet supported Sitting balance-Leahy Scale: Good     Standing balance support: No upper extremity supported Standing balance-Leahy Scale: Fair                               Pertinent Vitals/Pain Pain Assessment: No/denies pain  Start of therapy: - BP 116/64  -HR 84 -SpO2 94% on room air  End of therapy: - BP 128/77 -HR 87 -SpO2 95% on room air    Home Living Family/patient expects to be discharged to:: Private residence Living Arrangements: Spouse/significant other;Children Available Help at Discharge: Family;Available 24 hours/day Type of Home: House Home Access: Stairs to enter Entrance Stairs-Rails: None Entrance Stairs-Number of Steps: 2 Home Layout: Two level;1/2 bath on main level;Able to live on main level with bedroom/bathroom Home Equipment: Cane - quad;Shower seat - built in      Prior Function Level of Independence: Independent         Comments: used quad cane on rare occasion if he has gout flare-up     Hand Dominance   Dominant Hand: Right    Extremity/Trunk Assessment   Upper Extremity Assessment: Defer to OT evaluation  Lower Extremity Assessment: Generalized weakness         Communication   Communication: No difficulties  Cognition Arousal/Alertness: Awake/alert Behavior During Therapy: WFL for tasks assessed/performed Overall Cognitive Status: Within Functional Limits for tasks assessed                      General Comments General comments (skin integrity, edema, etc.): Discussed d/c planning and  pts expected goals in order to return home safely with wife.    Exercises General Exercises - Lower Extremity Ankle Circles/Pumps: AROM;Both;10 reps;Seated Quad Sets: Strengthening;Both;10 reps;Seated Gluteal Sets: Strengthening;Both;10 reps;Seated Long Arc Quad: Strengthening;Both;10 reps;Seated Hip Flexion/Marching: Right;Both;10 reps;Seated      Assessment/Plan    PT Assessment Patient needs continued PT services  PT Diagnosis Difficulty walking;Abnormality of gait;Generalized weakness   PT Problem List Decreased strength;Decreased activity tolerance;Decreased balance;Decreased mobility;Decreased range of motion;Decreased knowledge of use of DME;Cardiopulmonary status limiting activity;Obesity  PT Treatment Interventions DME instruction;Gait training;Stair training;Functional mobility training;Therapeutic activities;Therapeutic exercise;Balance training;Neuromuscular re-education;Patient/family education;Modalities   PT Goals (Current goals can be found in the Care Plan section) Acute Rehab PT Goals Patient Stated Goal: Go home PT Goal Formulation: With patient Time For Goal Achievement: 03/02/14 Potential to Achieve Goals: Fair    Frequency Min 3X/week   Barriers to discharge        Co-evaluation               End of Session Equipment Utilized During Treatment: Gait belt Activity Tolerance: Patient tolerated treatment well Patient left: in chair;with call bell/phone within reach;with nursing/sitter in room Nurse Communication: Mobility status         Time: 5643-3295 PT Time Calculation (min) (ACUTE ONLY): 27 min   Charges:   PT Evaluation $Initial PT Evaluation Tier I: 1 Procedure PT Treatments $Gait Training: 8-22 mins   PT G CodesEllouise Newer 02/16/2014, 4:42 PM Camille Bal [place], [place]

## 2014-02-16 NOTE — Progress Notes (Signed)
CT surgery p.m. Rounds  Patient examined and record reviewed.Hemodynamics stable,labs satisfactory.Patient had stable day.Continue current care. [name],PETER 02/16/2014

## 2014-02-16 NOTE — Progress Notes (Signed)
[name] is sitting in a chair and comfortable. Chest tube still in place. No problems with hypotension, itching, or N/V. Epidural site looks good. No erythema around the site. Will leave in place tonight. Will continue to follow.

## 2014-02-17 ENCOUNTER — Inpatient Hospital Stay (HOSPITAL_COMMUNITY): Payer: 59

## 2014-02-17 LAB — COMPREHENSIVE METABOLIC PANEL
ALT: 18 U/L (ref 0–53)
AST: 33 U/L (ref 0–37)
Albumin: 2.8 g/dL — ABNORMAL LOW (ref 3.5–5.2)
Alkaline Phosphatase: 73 U/L (ref 39–117)
Anion gap: 14 (ref 5–15)
BUN: 34 mg/dL — ABNORMAL HIGH (ref 6–23)
CO2: 23 mEq/L (ref 19–32)
Calcium: 9 mg/dL (ref 8.4–10.5)
Chloride: 93 mEq/L — ABNORMAL LOW (ref 96–112)
Creatinine, Ser: 1.85 mg/dL — ABNORMAL HIGH (ref 0.50–1.35)
GFR calc Af Amer: 46 mL/min — ABNORMAL LOW (ref >90)
GFR calc non Af Amer: 40 mL/min — ABNORMAL LOW (ref >90)
Glucose, Bld: 111 mg/dL — ABNORMAL HIGH (ref 70–99)
Potassium: 4.1 mEq/L (ref 3.7–5.3)
Sodium: 130 mEq/L — ABNORMAL LOW (ref 137–147)
Total Bilirubin: 0.6 mg/dL (ref 0.3–1.2)
Total Protein: 6.9 g/dL (ref 6.0–8.3)

## 2014-02-17 LAB — GLUCOSE, CAPILLARY
Glucose-Capillary: 120 mg/dL — ABNORMAL HIGH (ref 70–99)
Glucose-Capillary: 132 mg/dL — ABNORMAL HIGH (ref 70–99)
Glucose-Capillary: 138 mg/dL — ABNORMAL HIGH (ref 70–99)

## 2014-02-17 LAB — CBC
HCT: 31 % — ABNORMAL LOW (ref 39.0–52.0)
Hemoglobin: 10.8 g/dL — ABNORMAL LOW (ref 13.0–17.0)
MCH: 29.6 pg (ref 26.0–34.0)
MCHC: 34.8 g/dL (ref 30.0–36.0)
MCV: 84.9 fL (ref 78.0–100.0)
Platelets: 184 10*3/uL (ref 150–400)
RBC: 3.65 MIL/uL — ABNORMAL LOW (ref 4.22–5.81)
RDW: 13.9 % (ref 11.5–15.5)
WBC: 18.1 10*3/uL — ABNORMAL HIGH (ref 4.0–10.5)

## 2014-02-17 MED ORDER — METOLAZONE 5 MG PO TABS
5.0000 mg | ORAL_TABLET | Freq: Once | ORAL | Status: AC
  Administered 2014-02-17: 5 mg via ORAL
  Filled 2014-02-17 (×2): qty 1

## 2014-02-17 MED ORDER — FUROSEMIDE 40 MG PO TABS: 40.0000 mg | ORAL_TABLET | Freq: Two times a day (BID) | ORAL | Status: DC

## 2014-02-17 MED ORDER — FUROSEMIDE 80 MG PO TABS
80.0000 mg | ORAL_TABLET | Freq: Two times a day (BID) | ORAL | Status: DC
  Administered 2014-02-17 – 2014-02-18 (×3): 80 mg via ORAL
  Filled 2014-02-17 (×5): qty 1

## 2014-02-17 NOTE — Progress Notes (Signed)
Subjective: Interval History: has complaints concern about kidneys and gout.  Objective: Vital signs in last 24 hours: Temp:  [97.4 F (36.3 C)-99 F (37.2 C)] 98.5 F (36.9 C) (12/14 0700) Pulse Rate:  [77-97] 85 (12/14 0700) Resp:  [18-30] 24 (12/14 0700) BP: (94-142)/(59-104) 136/82 mmHg (12/14 0700) SpO2:  [89 %-98 %] 94 % (12/14 0700) Weight:  [116.9 kg (257 lb 11.5 oz)] 116.9 kg (257 lb 11.5 oz) (12/14 0600) Weight change: -0.2 kg (-7.1 oz)  Intake/Output from previous day: 12/13 0701 - 12/14 0700 In: 1906.4 [P.O.:1260; I.V.:446.4; IV Piggyback:200] Out: 2067 [Urine:1695; [place]; Chest Tube:370] Intake/Output this shift:    General appearance: alert, cooperative and moderately obese Resp: diminished breath sounds both bases but less bs on R.  and rhonchi bibasilar Chest wall: L [place] Pacer Cardio: S1, S2 normal and systolic murmur: holosystolic 2/6, blowing at lower left sternal border GI: obese, pos bs, liver down 5 cm Extremities: edema 2+  Lab Results:  Recent Labs  02/16/14 0545 02/17/14 0354  WBC 18.9* 18.1*  HGB 9.7* 10.8*  HCT 29.1* 31.0*  PLT 132* 184   BMET:  Recent Labs  02/16/14 0545 02/17/14 0354  NA 130* 130*  K 4.6 4.1  CL 95* 93*  CO2 22 23  GLUCOSE 94 111*  BUN 29* 34*  CREATININE 1.94* 1.85*  CALCIUM 8.5 9.0   No results for input(s): PTH in the last 72 hours. Iron Studies: No results for input(s): IRON, TIBC, TRANSFERRIN, FERRITIN in the last 72 hours.  Studies/Results: Dg Chest Port 1 View  02/17/2014   CLINICAL DATA:  Right VATS.  EXAM: PORTABLE CHEST - 1 VIEW  COMPARISON:  02/16/2014.  FINDINGS: Right IJ line in stable position. Interval removal right lower chest tube. Right upper chest tube in stable position. Postsurgical changes right lung. Mild bibasilar atelectasis. No pleural effusion or pneumothorax. Stable cardiomegaly with normal pulmonary vascularity. Cardiac pacer in stable position.  IMPRESSION: 1. Interim removal of  right lower chest tube . Right upper chest tube in stable position. No pneumothorax. Right IJ line in stable position. 2. Postsurgical changes right chest. 3. Bibasilar atelectasis. 4. Stable cardiomegaly.  No CHF.  Cardiac pacer in stable position.   Electronically Signed   By: Marcello Moores  Register   On: 02/17/2014 07:22   Dg Chest Port 1 View  02/16/2014   CLINICAL DATA:  Right-sided chest tube placement.  EXAM: PORTABLE CHEST - 1 VIEW  COMPARISON:  02/05/2014  FINDINGS: Left-sided transvenous AICD leads overlie the right ventricle. Right IJ central line overlies the superior vena cava. There are 2 right-sided chest tubes. No pneumothorax. There is right basilar atelectasis and volume loss. There has been improvement in aeration of the left lung base.  Heart is enlarged. Mild pulmonary vascular congestion without overt edema.  IMPRESSION: 1. Cardiomegaly and vascular congestion. 2. Improved aeration in the left lung base. 3. Persistent right lower lobe atelectasis. 4. No pneumothorax.   Electronically Signed   By: Shon Hale M.D.   On: 02/16/2014 10:10    I have reviewed the patient's current medications.  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 Stable GFR. Vol xs  ^ Lasix 2 Gout inactive, check UA 3 S/p resx of tumor per Thoracic 4 HTN controlled 5 CM 6 Anemia 7 HPTH P check PTH, ^ lasix and use po, check Fe, Uric acid.    LOS: 7 days   [name] L 02/17/2014,8:30 AM

## 2014-02-17 NOTE — Progress Notes (Signed)
Up in chair  BP 137/89 mmHg  Pulse 84  Temp(Src) 98.9 F (37.2 C) (Oral)  Resp 29  Ht 5\' 10"  (1.778 m)  Wt 257 lb 11.5 oz (116.9 kg)  BMI 36.98 kg/m2  SpO2 87%   Intake/Output Summary (Last 24 hours) at 02/17/14 2029 Last data filed at 02/17/14 1700  Gross per 24 hour  Intake 1426.2 ml  Output   2222 ml  Net -795.8 ml    Remains on milrinone

## 2014-02-17 NOTE — Progress Notes (Signed)
Physical Therapy Treatment Patient Details Name: [name] MRN: [id number] DOB: 1960-08-07 Today's Date: 02/17/2014    History of Present Illness 53 y.o. male s/p VATS procedure with removal of mediasintal mass on Rt. 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    PT Comments    Patient ambulated easily to stairs today with frequent cues for posture, which he reports to be having difficulty with secondary to his lines and leads, complains of feeling weighted down.  After completing ascent and descent of 4 steps, pt walked around the rest of the unit, requiring two rest breaks (one several minutes long, one brief) to complete this distance.  SpO2 dropped to 83% on room air while ambulating but was able to recover to 100% with rest and cueing for deep breathing.  Pt would continue to benefit from skilled therapy to address impairments in strength and endurance in order to maximize independence and return to PLOF.       Follow Up Recommendations  Home health PT;Supervision/Assistance - 24 hour     Equipment Recommendations       Recommendations for Other Services       Precautions / Restrictions Precautions Precautions: Fall    Mobility  Bed Mobility               General bed mobility comments: in recliner on arrival and end of session  Transfers Overall transfer level: Needs assistance   Transfers: Sit to/from Stand Sit to Stand: Min guard         General transfer comment: cues for safety and hand placement  Ambulation/Gait Ambulation/Gait assistance: Min guard Ambulation Distance (Feet): 400 Feet Assistive device: Rolling walker (2 wheeled) Gait Pattern/deviations: Step-through pattern;Decreased stride length;Trunk flexed   Gait velocity interpretation: Below normal speed for age/gender General Gait Details: cues for posture, position in RW. Pt maintaining downward gaze with flexion despite  cues   Stairs Stairs: Yes Stairs assistance: Min guard Stair Management: Step to pattern;One rail Left;Forwards Number of Stairs: 4 General stair comments: good control with stairs with rail without rail on 2 steps to enter home but wife can provide HHA  Wheelchair Mobility    Modified Rankin (Stroke Patients Only)       Balance Overall balance assessment: Needs assistance   Sitting balance-Leahy Scale: Good       Standing balance-Leahy Scale: Fair                      Cognition Arousal/Alertness: Awake/alert Behavior During Therapy: WFL for tasks assessed/performed Overall Cognitive Status: Within Functional Limits for tasks assessed                      Exercises General Exercises - Lower Extremity Long Arc Quad: Strengthening;Both;10 reps;Seated;AROM Hip Flexion/Marching: Both;10 reps;Seated;AROM    General Comments        Pertinent Vitals/Pain Pain Assessment: No/denies pain  SpO2 at rest: 96% (RA) SpO2 while ambulating: 83-100% (RA)    Home Living                      Prior Function            PT Goals (current goals can now be found in the care plan section) Progress towards PT goals: Progressing toward goals    Frequency       PT Plan Current plan remains appropriate    Co-evaluation  End of Session   Activity Tolerance: Patient tolerated treatment well Patient left: in chair;with call bell/phone within reach     Time: 0839-0904 PT Time Calculation (min) (ACUTE ONLY): 25 min  Charges:  $Gait Training: 8-22 mins $Therapeutic Exercise: 8-22 mins                    G Codes:      Journiee Feldkamp SPT 02/17/2014, 9:28 AM

## 2014-02-17 NOTE — Progress Notes (Signed)
  [name] is POD #4 s/p right thoracotomy with resection of a peritracheal mass managed post operatively with a T6/7 epidural. Patient endorses adequate pain control with epidural and denies back pain, head ache, numbness or weakness of his upper or lower extremities.  Blood pressure 150/82, pulse 94, temperature 36.9 C, temperature source Oral, resp. rate 21, height 5\' 10"  (1.778 m), weight 257 lb 11.5 oz (116.9 kg), SpO2 98 %.  A+O RRR CTAB with dressing over right chest, chest tube Epidural site with scant old heme at insertion site, no erythema or tenderness to palpation  A/P: 54 yo M s/p right thoracotomy with resection of peritracheal mass. Continue epidural catheter with 0.2% Ropivacaine at 75ml/hr until chest tubes removed.    Oleta Mouse, MD 02/17/2014 11:25 AM

## 2014-02-17 NOTE — Progress Notes (Signed)
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    He has had stable NYHA III symptoms. Was admitted several months for a [place] to biopsy the mass. Post-op he developed low-output HF a/b acute renal failure requiring milrinone support. He was admitted 12/7 for pre-operative optimization prior to sternotomy for mass excision 12/10.  Underwent VATs with resection of mediastinal mass (12/11). Doing well. Remains on milrinone 0.25 mcg. Required short term lasix drip but transitioned to lasix 40 mg IV bid. Weight down 1 pound.   Denies SOB/Orthopnea.       Objective:   Weight Range:  Vital Signs:   Temp:  [97.4 F (36.3 C)-99 F (37.2 C)] 98.5 F (36.9 C) (12/14 0700) Pulse Rate:  [77-102] 102 (12/14 0852) Resp:  [18-30] 24 (12/14 0700) BP: (94-142)/(59-104) 136/82 mmHg (12/14 0700) SpO2:  [89 %-98 %] 94 % (12/14 0852) Weight:  [257 lb 11.5 oz (116.9 kg)] 257 lb 11.5 oz (116.9 kg) (12/14 0600) Last BM Date: 02/16/14  Weight change: Filed Weights   02/15/14 0600 02/16/14 0600 02/17/14 0600  Weight: 260 lb 1.6 oz (117.981 kg) 258 lb 2.5 oz (117.1 kg) 257 lb 11.5 oz (116.9 kg)    Intake/Output:   Intake/Output Summary (Last 24 hours) at 02/17/14 0904 Last data filed at 02/17/14 0700  Gross per 24 hour  Intake 1449.2 ml  Output   2047 ml  Net -597.8 ml     Physical Exam: CVP 16  General: Sitting in chair. No resp difficulty HEENT: normal Neck: supple. RIJ sheath JVP to jaw Cor: PMI laterally displaced. Regular rate & rhythm. No rubs, gallops or murmurs. Lungs: clear Abdomen: soft, nontender, + distended. + bowel sounds. Extremities: no cyanosis, clubbing, rash, RUE PICC. R and LLE  2+ edema.  Neuro: alert & orientedx3, cranial nerves grossly intact. moves all 4 extremities w/o difficulty. Affect pleasant  Telemetry: SR with v pacing  Labs: Basic Metabolic Panel:  Recent Labs Lab 02/10/14 1755  02/12/14 1750 02/13/14 0500  02/13/14 1600  02/14/14 0420  02/14/14 1617 02/14/14 1636 02/15/14 0508 02/16/14 0545 02/17/14 0354  NA 137  < > 137 134*  < > 135*  < > 136*  < > 140 137 137 130* 130*  K 4.5  < > 4.4 4.3  < > 6.0*  < > 4.8  < > 3.9 4.4 4.7 4.6 4.1  CL 98  < > 99 97  --  102  --  101  < > 104 100 100 95* 93*  CO2 24  < > 24 23  --  22  --  22  --   --   --  22 22 23   GLUCOSE 133*  < > 142* 108*  --  135*  < > 155*  < > 683* 282* 139* 94 111*  BUN 21  < > 27* 25*  --  26*  --  26*  < > 20 24* 24* 29* 34*  CREATININE 1.33  < > 1.57* 1.46*  --  1.43*  --  1.60*  < > 1.30 1.50* 1.97* 1.94* 1.85*  CALCIUM 9.9  < > 9.2 9.4  --  9.0  --  8.5  --   --   --  8.8 8.5 9.0  MG 1.6  --  2.0 1.9  --   --   --   --   --   --   --   --   --   --   < > = values in this interval not displayed.  Liver Function Tests:  Recent Labs Lab 02/10/14 1755 02/12/14 1750 02/15/14 0508 02/16/14 0545 02/17/14 0354  AST 19 16 36 30 33  ALT 22 18 13 14 18   ALKPHOS 75 72 67 63 73  BILITOT 0.7 1.1 1.1 0.8 0.6  PROT 8.1 7.7 6.2 6.1 6.9  ALBUMIN 3.7 3.5 3.0* 2.7* 2.8*   No results for input(s): LIPASE, AMYLASE in the last 168 hours. No results for input(s): AMMONIA in the last 168 hours.  CBC:  Recent Labs Lab 02/12/14 1750  02/14/14 0420  02/14/14 1617 02/14/14 1636 02/15/14 0508 02/16/14 0545 02/17/14 0354  WBC 9.7  --  15.7*  --   --   --  20.0* 18.9* 18.1*  HGB 15.7  < > 11.3*  < > 10.2* 11.2* 10.5* 9.7* 10.8*  HCT 46.0  < > 32.7*  < > 30.0* 33.0* 30.6* 29.1* 31.0*  MCV 83.2  --  84.9  --   --   --  83.4 83.6 84.9  PLT 153  --  105*  --   --   --  118* 132* 184  < > = values in this interval not displayed.  Cardiac Enzymes: No results for input(s):  CKTOTAL, CKMB, CKMBINDEX, TROPONINI in the last 168 hours.  BNP: BNP (last 3 results)  Recent Labs  01/20/14 1414 02/10/14 1755  PROBNP 519.4* 653.7*     Other results:    Imaging: Dg Chest Port 1 View  02/17/2014   CLINICAL DATA:  Right VATS.  EXAM: PORTABLE CHEST - 1 VIEW  COMPARISON:  02/16/2014.  FINDINGS: Right IJ line in stable position. Interval removal right lower chest tube. Right upper chest tube in stable position. Postsurgical changes right lung. Mild bibasilar atelectasis. No pleural effusion or pneumothorax. Stable cardiomegaly with normal pulmonary vascularity. Cardiac pacer in stable position.  IMPRESSION: 1. Interim removal of right lower chest tube . Right upper chest tube in stable position. No pneumothorax. Right IJ line in stable position. 2. Postsurgical changes right chest. 3. Bibasilar atelectasis. 4. Stable cardiomegaly.  No CHF.  Cardiac pacer in stable position.   Electronically Signed   By: Marcello Moores  Register   On: 02/17/2014 07:22   Dg Chest Port 1 View  02/16/2014   CLINICAL DATA:  Right-sided chest tube placement.  EXAM: PORTABLE CHEST - 1 VIEW  COMPARISON:  02/05/2014  FINDINGS: Left-sided transvenous AICD leads overlie the right ventricle. Right IJ central line overlies the superior vena cava. There are 2 right-sided chest tubes. No pneumothorax. There is right basilar atelectasis and volume loss. There has been improvement in aeration of the left lung base.  Heart is enlarged. Mild pulmonary vascular congestion without overt edema.  IMPRESSION: 1. Cardiomegaly and vascular congestion. 2. Improved aeration in the left lung base. 3. Persistent right lower lobe atelectasis. 4. No pneumothorax.   Electronically Signed   By: Shon Hale M.D.   On: 02/16/2014 10:10     Medications:     Scheduled Medications: . acetaminophen  1,000 mg Oral 4 times per day   Or  . acetaminophen (TYLENOL) oral liquid 160 mg/5 mL  1,000 mg Oral 4 times per day  . antiseptic  oral  rinse  7 mL Mouth Rinse BID  . atorvastatin  40 mg Oral q1800  . bisacodyl  10 mg Oral Daily  . carvedilol  6.25 mg Oral BID WC  . cefTAZidime (FORTAZ)  IV  1 g Intravenous 3 times per day  . digoxin  0.125 mg Oral Q48H  . febuxostat  80 mg Oral QHS  . furosemide  80 mg Oral BID  . heparin  5,000 Units Subcutaneous 3 times per day  . insulin aspart  0-24 Units Subcutaneous 4 times per day  . lactulose  30 g Oral Daily  . levothyroxine  150 mcg Oral QAC breakfast  . magnesium oxide  400 mg Oral BID  . pantoprazole  40 mg Oral Daily  . senna-docusate  1 tablet Oral QHS  . tobramycin-dexamethasone  2 drop Right Eye BID    Infusions: . dextrose 5 % and 0.9% NaCl 10 mL/hr at 02/17/14 0700  . DOPamine Stopped (02/13/14 1705)  . milrinone 0.25 mcg/kg/min (02/17/14 0700)  . ropivacaine (PF) 2 mg/ml (0.2%) 8 mL/hr (02/17/14 0429)    PRN Medications: fentaNYL, HYDROmorphone, ondansetron (ZOFRAN) IV, potassium chloride   Assessment:   1. Chronic systolic HF due to NICM  --with episode of low output HF requiring milrinone support this summer 2. 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 Acute on chronic renal failure, stage III 4. Hypomagenesemia 5. Polycythemia  Plan/Discussion:    Much improved. Volume status stable. Continue lasix 40 mg IV twice a day and give one dose of 2.5 mg metolazone. Watch renal function closely. Continue milrinone 0.25 mcg. Unable to check CO-OX from PICC . Add ted hose.    CLEGG,AMY NP-C  9:04 AM  Patient seen and examined with Darrick Grinder, NP. We discussed all aspects of the encounter. I agree with the assessment and plan as stated above.   Improving but volume status remains elevated. CVP 15. Will add metolazone. Continue IV lasix and milrinone. Renal function improving.   Keimon Basaldua,MD 5:29 PM

## 2014-02-17 NOTE — Progress Notes (Signed)
4 Days Post-Op Procedure(s) (LRB): VIDEO ASSISTED THORACOSCOPY (VATS)/THOROCOTOMY (Right) RESECTION OF MEDIASTINAL MASS (Right) Subjective: Rt thoracotomy for resection of 9cm mediastinal tumor-paraganglioma Cardiomyopathy- EF 15-20 Chronic renal disease  Objective: Vital signs in last 24 hours: Temp:  [97.4 F (36.3 C)-99 F (37.2 C)] 98.7 F (37.1 C) (12/14 1300) Pulse Rate:  [79-102] 79 (12/14 1300) Cardiac Rhythm:  [-] A-V Sequential paced (12/14 0800) Resp:  [18-36] 26 (12/14 1300) BP: (95-150)/(64-104) 112/67 mmHg (12/14 1300) SpO2:  [89 %-98 %] 96 % (12/14 1300) Weight:  [257 lb 11.5 oz (116.9 kg)] 257 lb 11.5 oz (116.9 kg) (12/14 0600)  Hemodynamic parameters for last 24 hours:  nsr  Intake/Output from previous day: 12/13 0701 - 12/14 0700 In: 1906.4 [P.O.:1260; I.V.:446.4; IV Piggyback:200] Out: 2067 [Urine:1695; Stool:2; Chest Tube:370] Intake/Output this shift: Total I/O In: 453 [P.O.:360; I.V.:93] Out: 450 [Urine:450]  Chest tube draining 200cc thin serosanguinous fluid Evidence of fluid overload on exam  Lab Results:  Recent Labs  02/16/14 0545 02/17/14 0354  WBC 18.9* 18.1*  HGB 9.7* 10.8*  HCT 29.1* 31.0*  PLT 132* 184   BMET:  Recent Labs  02/16/14 0545 02/17/14 0354  NA 130* 130*  K 4.6 4.1  CL 95* 93*  CO2 22 23  GLUCOSE 94 111*  BUN 29* 34*  CREATININE 1.94* 1.85*  CALCIUM 8.5 9.0    PT/INR: No results for input(s): LABPROT, INR in the last 72 hours. ABG    Component Value Date/Time   PHART 7.362 02/14/2014 0422   HCO3 21.8 02/14/2014 0422   TCO2 19 02/14/2014 1636   ACIDBASEDEF 3.0* 02/14/2014 0422   O2SAT 64.6 02/15/2014 0625   CBG (last 3)   Recent Labs  02/16/14 2355 02/17/14 0640 02/17/14 1317  GLUCAP 120* 132* 138*    Assessment/Plan: S/P Procedure(s) (LRB): VIDEO ASSISTED THORACOSCOPY (VATS)/THOROCOTOMY (Right) RESECTION OF MEDIASTINAL MASS (Right) Cont epidural for pain,mirinone for CHF Leave chest tube  in place Increase diuresis per HF service   LOS: 7 days    [name],Chalene Treu 02/17/2014

## 2014-02-18 ENCOUNTER — Inpatient Hospital Stay (HOSPITAL_COMMUNITY): Payer: 59

## 2014-02-18 LAB — COMPREHENSIVE METABOLIC PANEL
ALT: 19 U/L (ref 0–53)
AST: 36 U/L (ref 0–37)
Albumin: 2.6 g/dL — ABNORMAL LOW (ref 3.5–5.2)
Alkaline Phosphatase: 80 U/L (ref 39–117)
Anion gap: 13 (ref 5–15)
BUN: 33 mg/dL — ABNORMAL HIGH (ref 6–23)
CO2: 25 mEq/L (ref 19–32)
Calcium: 8.4 mg/dL (ref 8.4–10.5)
Chloride: 89 mEq/L — ABNORMAL LOW (ref 96–112)
Creatinine, Ser: 1.47 mg/dL — ABNORMAL HIGH (ref 0.50–1.35)
GFR calc Af Amer: 61 mL/min — ABNORMAL LOW (ref >90)
GFR calc non Af Amer: 52 mL/min — ABNORMAL LOW (ref >90)
Glucose, Bld: 331 mg/dL — ABNORMAL HIGH (ref 70–99)
Potassium: 3.6 mEq/L — ABNORMAL LOW (ref 3.7–5.3)
Sodium: 127 mEq/L — ABNORMAL LOW (ref 137–147)
Total Bilirubin: 0.5 mg/dL (ref 0.3–1.2)
Total Protein: 6.6 g/dL (ref 6.0–8.3)

## 2014-02-18 LAB — GLUCOSE, CAPILLARY
Glucose-Capillary: 108 mg/dL — ABNORMAL HIGH (ref 70–99)
Glucose-Capillary: 123 mg/dL — ABNORMAL HIGH (ref 70–99)
Glucose-Capillary: 115 mg/dL — ABNORMAL HIGH (ref 70–99)
Glucose-Capillary: 122 mg/dL — ABNORMAL HIGH (ref 70–99)

## 2014-02-18 LAB — CBC
HCT: 27.7 % — ABNORMAL LOW (ref 39.0–52.0)
HCT: 29.1 % — ABNORMAL LOW (ref 39.0–52.0)
Hemoglobin: 9.6 g/dL — ABNORMAL LOW (ref 13.0–17.0)
Hemoglobin: 9.9 g/dL — ABNORMAL LOW (ref 13.0–17.0)
MCH: 28.7 pg (ref 26.0–34.0)
MCH: 28.3 pg (ref 26.0–34.0)
MCHC: 34.7 g/dL (ref 30.0–36.0)
MCHC: 34 g/dL (ref 30.0–36.0)
MCV: 82.9 fL (ref 78.0–100.0)
MCV: 83.1 fL (ref 78.0–100.0)
Platelets: 211 10*3/uL (ref 150–400)
Platelets: 242 10*3/uL (ref 150–400)
RBC: 3.34 MIL/uL — ABNORMAL LOW (ref 4.22–5.81)
RBC: 3.5 MIL/uL — ABNORMAL LOW (ref 4.22–5.81)
RDW: 13.8 % (ref 11.5–15.5)
RDW: 13.8 % (ref 11.5–15.5)
WBC: 11.7 10*3/uL — ABNORMAL HIGH (ref 4.0–10.5)
WBC: 12.4 10*3/uL — ABNORMAL HIGH (ref 4.0–10.5)

## 2014-02-18 LAB — RENAL FUNCTION PANEL
ANION GAP: 14 (ref 5–15)
Albumin: 2.6 g/dL — ABNORMAL LOW (ref 3.5–5.2)
Albumin: 2.8 g/dL — ABNORMAL LOW (ref 3.5–5.2)
Anion gap: 12 (ref 5–15)
BUN: 32 mg/dL — ABNORMAL HIGH (ref 6–23)
BUN: 34 mg/dL — ABNORMAL HIGH (ref 6–23)
CO2: 25 mEq/L (ref 19–32)
CO2: 26 mEq/L (ref 19–32)
Calcium: 8.4 mg/dL (ref 8.4–10.5)
Calcium: 9 mg/dL (ref 8.4–10.5)
Chloride: 88 mEq/L — ABNORMAL LOW (ref 96–112)
Chloride: 93 mEq/L — ABNORMAL LOW (ref 96–112)
Creatinine, Ser: 1.44 mg/dL — ABNORMAL HIGH (ref 0.50–1.35)
Creatinine, Ser: 1.47 mg/dL — ABNORMAL HIGH (ref 0.50–1.35)
GFR calc Af Amer: 61 mL/min — ABNORMAL LOW (ref >90)
GFR calc non Af Amer: 54 mL/min — ABNORMAL LOW (ref >90)
GFR calc non Af Amer: 52 mL/min — ABNORMAL LOW (ref >90)
GFR, EST AFRICAN AMERICAN: 62 mL/min — AB (ref >90)
GLUCOSE: 339 mg/dL — AB (ref 70–99)
Glucose, Bld: 101 mg/dL — ABNORMAL HIGH (ref 70–99)
PHOSPHORUS: 3.2 mg/dL (ref 2.3–4.6)
POTASSIUM: 3.5 meq/L — AB (ref 3.7–5.3)
Phosphorus: 3.5 mg/dL (ref 2.3–4.6)
Potassium: 4.3 mEq/L (ref 3.7–5.3)
SODIUM: 127 meq/L — AB (ref 137–147)
Sodium: 131 mEq/L — ABNORMAL LOW (ref 137–147)

## 2014-02-18 LAB — CARBOXYHEMOGLOBIN
Carboxyhemoglobin: 1.5 % (ref 0.5–1.5)
Methemoglobin: 0.9 % (ref 0.0–1.5)
O2 Saturation: 58.2 %
Total hemoglobin: 10.2 g/dL — ABNORMAL LOW (ref 13.5–18.0)

## 2014-02-18 LAB — IRON AND TIBC
Iron: 21 ug/dL — ABNORMAL LOW (ref 42–135)
Saturation Ratios: 12 % — ABNORMAL LOW (ref 20–55)
TIBC: 169 ug/dL — ABNORMAL LOW (ref 215–435)
UIBC: 148 ug/dL (ref 125–400)

## 2014-02-18 LAB — URIC ACID: Uric Acid, Serum: 6.6 mg/dL (ref 4.0–7.8)

## 2014-02-18 MED ORDER — POTASSIUM CHLORIDE CRYS ER 20 MEQ PO TBCR: 20.0000 meq | EXTENDED_RELEASE_TABLET | Freq: Every day | ORAL | Status: DC

## 2014-02-18 MED ORDER — FUROSEMIDE 10 MG/ML IJ SOLN
80.0000 mg | Freq: Two times a day (BID) | INTRAMUSCULAR | Status: DC
  Administered 2014-02-19: 80 mg via INTRAVENOUS
  Filled 2014-02-18: qty 8

## 2014-02-18 MED ORDER — POTASSIUM CHLORIDE CRYS ER 20 MEQ PO TBCR: 40.0000 meq | EXTENDED_RELEASE_TABLET | Freq: Once | ORAL | Status: DC

## 2014-02-18 NOTE — Progress Notes (Signed)
  [name] is POD #5 s/p right thoracotomy with resection of a peritracheal mass managed post operatively with a T6/7 epidural. Patient endorses adequate pain control with epidural and denies back pain, head ache, numbness or weakness of his upper or lower extremities.  Blood pressure 123/73, pulse 78, temperature 37 C, temperature source Oral, resp. rate 25, height 5\' 10"  (1.778 m), weight 256 lb 2.8 oz (116.2 kg), SpO2 98 %.   A+O RRR CTAB with dressing over right chest, chest tube Epidural site with scant old heme at insertion site, no erythema or tenderness to palpation, dressing reinforced   A/P: 53 yo M s/p right thoracotomy with resection of peritracheal mass. Continue epidural catheter with 0.2% Ropivacaine at 56ml/hr until chest tubes removed.  Plan is to evaluate removal tomorrow  Oleta Mouse, MD 02/18/2014 4:38 PM

## 2014-02-18 NOTE — Progress Notes (Signed)
Subjective: Interval History: has no complaint .  Objective: Vital signs in last 24 hours: Temp:  [97.6 F (36.4 C)-98.9 F (37.2 C)] 98.3 F (36.8 C) (12/15 0700) Pulse Rate:  [72-102] 73 (12/15 0700) Resp:  [19-36] 23 (12/15 0700) BP: (90-150)/(55-92) 121/77 mmHg (12/15 0700) SpO2:  [87 %-99 %] 90 % (12/15 0700) Weight:  [116.2 kg (256 lb 2.8 oz)] 116.2 kg (256 lb 2.8 oz) (12/15 0700) Weight change: -0.7 kg (-1 lb 8.7 oz)  Intake/Output from previous day: 12/14 0701 - 12/15 0700 In: 701.8 [P.O.:360; I.V.:241.8; IV Piggyback:100] Out: 1250 [Urine:1250] Intake/Output this shift:    General appearance: alert, cooperative and morbidly obese Resp: diminished breath sounds RLL and rales bibasilar Chest wall: L [place] Pacer Cardio: S1, S2 normal and systolic murmur: holosystolic 2/6, blowing at apex GI: obese, pos bs, liver down 7 cm. Extremities: edema 2+  Lab Results:  Recent Labs  02/17/14 0354 02/18/14 0500  WBC 18.1* 11.7*  HGB 10.8* 9.6*  HCT 31.0* 27.7*  PLT 184 211   BMET:  Recent Labs  02/17/14 0354 02/18/14 0500  NA 130* 127*  127*  K 4.1 3.6*  3.5*  CL 93* 89*  88*  CO2 23 25  25   GLUCOSE 111* 331*  339*  BUN 34* 33*  32*  CREATININE 1.85* 1.47*  1.44*  CALCIUM 9.0 8.4  8.4   No results for input(s): PTH in the last 72 hours. Iron Studies: No results for input(s): IRON, TIBC, TRANSFERRIN, FERRITIN in the last 72 hours.  Studies/Results: Dg Chest Port 1 View  02/18/2014   CLINICAL DATA:  VATS  EXAM: PORTABLE CHEST - 1 VIEW  COMPARISON:  02/17/2014.  FINDINGS: Right IJ line in stable position. Right chest tube in stable position. Surgical clips right chest. Cardiomegaly. Cardiac pacer noted with lead tips in right atrium and right ventricle. Pulmonary vascularity normal. Patchy bilateral pulmonary infiltrates. No pneumothorax. No acute bony abnormality  IMPRESSION: 1. Right IJ line and right chest tube in stable position. No pneumothorax. 2. Patchy  infiltrates in the right mid and lower lung as well as the left lower lung. 3. Stable cardiomegaly.  Cardiac pacer is stable. 4. Postsurgical changes right chest.   Electronically Signed   By: [place]   On: 02/18/2014 07:53   Dg Chest Port 1 View  02/17/2014   CLINICAL DATA:  Right VATS.  EXAM: PORTABLE CHEST - 1 VIEW  COMPARISON:  02/16/2014.  FINDINGS: Right IJ line in stable position. Interval removal right lower chest tube. Right upper chest tube in stable position. Postsurgical changes right lung. Mild bibasilar atelectasis. No pleural effusion or pneumothorax. Stable cardiomegaly with normal pulmonary vascularity. Cardiac pacer in stable position.  IMPRESSION: 1. Interim removal of right lower chest tube . Right upper chest tube in stable position. No pneumothorax. Right IJ line in stable position. 2. Postsurgical changes right chest. 3. Bibasilar atelectasis. 4. Stable cardiomegaly.  No CHF.  Cardiac pacer in stable position.   Electronically Signed   By: Marcello Moores  Register   On: 02/17/2014 07:22    I have reviewed the patient's current medications.  Assessment/Plan: 1 CKD3-4  Cr stable. Diuresing slowly , cont po lasix.   2 low K from [facility].  Should maximize loop prior to use 3 CM 4 Paraganglioma per [place] 5 ^ glu, f/u 6obesity 7 OSA 8 Gout P po lasix, check Fe, K replete.    LOS: 8 days   Heela Heishman L 02/18/2014,8:26 AM

## 2014-02-18 NOTE — Progress Notes (Signed)
Patient is refusing to take IV lasix at this time; he states "IV lasix triggers his gout" which he "is feeling coming on" and he states that he will not take any lasix until he is started on his gout medication. He is waiting to see Dr. Prescott Gum on evening rounds to discuss this as he states Dr. Prescott Gum is familiar with his history and this medication. Patient advised that Dr. Prescott Gum will be rounding later this evening when he is finished in OR, patient understands. The patient also refusing to ambulate this afternoon because of the oncoming gout pain and says he needs to limit exercise as much a possible with a gout flare.

## 2014-02-18 NOTE — Progress Notes (Signed)
Inpatient Diabetes Program Recommendations  AACE/ADA: New Consensus Statement on Inpatient Glycemic Control (2013)  Target Ranges:  Prepandial:   less than 140 mg/dL      Peak postprandial:   less than 180 mg/dL (1-2 hours)      Critically ill patients:  140 - 180 mg/dL   Results for TORRIAN, CANION (MRN [id number]) as of 02/18/2014 14:58  Ref. Range 02/16/2014 23:55 02/17/2014 06:40 02/17/2014 13:17 02/18/2014 07:13 02/18/2014 11:07  Glucose-Capillary Latest Range: 70-99 mg/dL 120 (H) 132 (H) 138 (H) 108 (H) 123 (H)   Reason for assessment: 1 elevated blood sugar from lab  Diabetes history: none Outpatient Diabetes medications: none Current orders for Inpatient glycemic control: Novolog correction 0-24 units q4h   Continue Novolog correction.  Noted a slightly elevated A1C in 4/ 24/2014- May want to consider ordering an A1C to evaluate outpatient blood sugar control.   Gentry Fitz, RN, BA, MHA, CDE Diabetes Coordinator Inpatient Diabetes Program  [phone] (Team Pager) [phone] [facility]) 02/18/2014 3:08 PM

## 2014-02-18 NOTE — Progress Notes (Signed)
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    He has had stable NYHA III symptoms. Was admitted several months for a [place] to biopsy the mass. Post-op he developed low-output HF a/b acute renal failure requiring milrinone support. He was admitted 12/7 for pre-operative optimization prior to sternotomy for mass excision 12/10.  Underwent VATs with resection of mediastinal mass (12/11). Doing well. Remains on milrinone 0.25 mcg. Ambulating. Back on po lasix. Yesterday he received one dose of metolazone. Weight down 1 pound. CVP still 10  Denies SOB/Orthopnea.    Creatinine 1.4>1.47  CO-OX 58%    Objective:   Weight Range:  Vital Signs:   Temp:  [97.6 F (36.4 C)-98.9 F (37.2 C)] 98.3 F (36.8 C) (12/15 0700) Pulse Rate:  [72-88] 73 (12/15 0700) Resp:  [19-36] 23 (12/15 0700) BP: (90-146)/(55-92) 121/77 mmHg (12/15 0700) SpO2:  [87 %-99 %] 90 % (12/15 0700) Weight:  [256 lb 2.8 oz (116.2 kg)] 256 lb 2.8 oz (116.2 kg) (12/15 0700) Last BM Date: 02/16/14  Weight change: Filed Weights   02/16/14 0600 02/17/14 0600 02/18/14 0700  Weight: 258 lb 2.5 oz (117.1 kg) 257 lb 11.5 oz (116.9 kg) 256 lb 2.8 oz (116.2 kg)    Intake/Output:   Intake/Output Summary (Last 24 hours) at 02/18/14 0912 Last data filed at 02/18/14 0200  Gross per 24 hour  Intake  424.6 ml  Output   1250 ml  Net -825.4 ml     Physical Exam: CVP  General: Sitting in chair. No resp difficulty HEENT: normal Neck: supple. RIJ sheath JVP 9-10 Cor: PMI laterally displaced. Regular rate & rhythm. No rubs, gallops or murmurs. Lungs: clear Abdomen: soft, nontender, + distended. + bowel sounds. Extremities: no  cyanosis, clubbing, rash, RUE PICC. R and LLE 2+ edema. Ted hose in place Neuro: alert & orientedx3, cranial nerves grossly intact. moves all 4 extremities w/o difficulty. Affect pleasant  Telemetry: SR with v pacing  Labs: Basic Metabolic Panel:  Recent Labs Lab 02/12/14 1750 02/13/14 0500  02/14/14 0420  02/14/14 1636 02/15/14 0508 02/16/14 0545 02/17/14 0354 02/18/14 0500  NA 137 134*  < > 136*  < > 137 137 130* 130* 127*  127*  K 4.4 4.3  < > 4.8  < > 4.4 4.7 4.6 4.1 3.6*  3.5*  CL 99 97  < > 101  < > 100 100 95* 93* 89*  88*  CO2 24 23  < > 22  --   --  22 22 23 25  25   GLUCOSE 142* 108*  < > 155*  < > 282* 139* 94 111* 331*  339*  BUN 27* 25*  < > 26*  < > 24* 24* 29* 34* 33*  32*  CREATININE 1.57* 1.46*  < > 1.60*  < > 1.50* 1.97* 1.94* 1.85* 1.47*  1.44*  CALCIUM 9.2 9.4  < > 8.5  --   --  8.8 8.5 9.0 8.4  8.4  MG 2.0 1.9  --   --   --   --   --   --   --   --   PHOS  --   --   --   --   --   --   --   --   --  3.2  < > = values in this interval not displayed.  Liver Function Tests:  Recent Labs Lab 02/12/14 1750 02/15/14 0508 02/16/14 0545 02/17/14 0354 02/18/14 0500  AST 16 36 30 33 36  ALT 18 13 14 18 19   ALKPHOS 72 67 63 73 80  BILITOT 1.1 1.1 0.8 0.6 0.5  PROT 7.7 6.2 6.1 6.9 6.6  ALBUMIN 3.5 3.0* 2.7* 2.8* 2.6*  2.6*   No results for input(s): LIPASE, AMYLASE in the last 168 hours. No results for input(s): AMMONIA in the last 168 hours.  CBC:  Recent Labs Lab 02/14/14 0420  02/14/14 1636 02/15/14 0508 02/16/14 0545 02/17/14 0354 02/18/14 0500  WBC 15.7*  --   --  20.0* 18.9* 18.1* 11.7*  HGB 11.3*  < > 11.2* 10.5* 9.7* 10.8* 9.6*  HCT 32.7*  < > 33.0* 30.6* 29.1* 31.0* 27.7*  MCV 84.9  --   --  83.4 83.6 84.9 82.9  PLT 105*  --   --  118* 132* 184 211  < > = values in this interval not displayed.  Cardiac Enzymes: No results for input(s): CKTOTAL, CKMB, CKMBINDEX, TROPONINI in the last 168 hours.  BNP: BNP (last 3  results)  Recent Labs  01/20/14 1414 02/10/14 1755  PROBNP 519.4* 653.7*     Other results:    Imaging: Dg Chest Port 1 View  02/18/2014   CLINICAL DATA:  VATS  EXAM: PORTABLE CHEST - 1 VIEW  COMPARISON:  02/17/2014.  FINDINGS: Right IJ line in stable position. Right chest tube in stable position. Surgical clips right chest. Cardiomegaly. Cardiac pacer noted with lead tips in right atrium and right ventricle. Pulmonary vascularity normal. Patchy bilateral pulmonary infiltrates. No pneumothorax. No acute bony abnormality  IMPRESSION: 1. Right IJ line and right chest tube in stable position. No pneumothorax. 2. Patchy infiltrates in the right mid and lower lung as well as the left lower lung. 3. Stable cardiomegaly.  Cardiac pacer is stable. 4. Postsurgical changes right chest.   Electronically Signed   By: [place]   On: 02/18/2014 07:53   Dg Chest Port 1 View  02/17/2014   CLINICAL DATA:  Right VATS.  EXAM: PORTABLE CHEST - 1 VIEW  COMPARISON:  02/16/2014.  FINDINGS: Right IJ line in stable position. Interval removal right lower chest tube. Right upper chest tube in stable position. Postsurgical changes right lung. Mild bibasilar atelectasis. No pleural effusion or pneumothorax. Stable cardiomegaly with normal pulmonary vascularity. Cardiac pacer in stable position.  IMPRESSION: 1. Interim removal of right lower chest tube . Right upper chest tube in stable position. No pneumothorax. Right IJ line in stable position. 2. Postsurgical changes right chest. 3. Bibasilar atelectasis. 4. Stable cardiomegaly.  No CHF.  Cardiac pacer in stable position.   Electronically Signed   By: Marcello Moores  Register   On: 02/17/2014 07:22     Medications:     Scheduled Medications: . acetaminophen  1,000 mg Oral 4 times per day   Or  . acetaminophen (TYLENOL) oral liquid 160 mg/5 mL  1,000 mg Oral 4 times per day  . antiseptic oral rinse  7 mL Mouth Rinse BID  . atorvastatin  40 mg Oral q1800  .  bisacodyl  10 mg Oral Daily  . carvedilol  6.25 mg Oral BID WC  . cefTAZidime (FORTAZ)  IV  1 g Intravenous 3 times per day  . digoxin  0.125 mg Oral Q48H  . febuxostat  80 mg Oral QHS  .  furosemide  80 mg Oral BID  . heparin  5,000 Units Subcutaneous 3 times per day  . insulin aspart  0-24 Units Subcutaneous 4 times per day  . lactulose  30 g Oral Daily  . levothyroxine  150 mcg Oral QAC breakfast  . magnesium oxide  400 mg Oral BID  . pantoprazole  40 mg Oral Daily  . potassium chloride  20 mEq Oral Daily  . potassium chloride  40 mEq Oral Once  . senna-docusate  1 tablet Oral QHS  . tobramycin-dexamethasone  2 drop Right Eye BID    Infusions: . dextrose 5 % and 0.9% NaCl 10 mL/hr at 02/18/14 0200  . milrinone 0.25 mcg/kg/min (02/18/14 0211)  . ropivacaine (PF) 2 mg/ml (0.2%) 8 mL/hr (02/18/14 0211)    PRN Medications: fentaNYL, HYDROmorphone, ondansetron (ZOFRAN) IV   Assessment:   1. Chronic systolic HF due to NICM  --with episode of low output HF requiring milrinone support this summer 2. 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 Acute on chronic renal failure, stage III 4. Hypomagenesemia 5. Polycythemia  Plan/Discussion:    Much improved. Volume status improved. Continue lasix 80 mg po bid. Renal funciton ok. Continue milrinone 0.25 mcg. CO-OX 58%.     CLEGG,AMY NP-C  9:12 AM   Patient seen and examined with Darrick Grinder, NP. We discussed all aspects of the encounter. I agree with the assessment and plan as stated above.   Improving but still with volume on board. Give lasix 80 IV x 2 doses. Continue milrinone. Watch renal function.    Becky Berberian,MD 1:07 PM

## 2014-02-18 NOTE — Progress Notes (Signed)
Nutrition Brief Note  Patient identified on the Malnutrition Screening Tool (MST) Report.  Wt Readings from Last 15 Encounters:  02/18/14 256 lb 2.8 oz (116.2 kg)  02/05/14 254 lb (115.214 kg)  01/20/14 250 lb (113.399 kg)  01/15/14 254 lb (115.214 kg)  01/10/14 254 lb 3.2 oz (115.304 kg)  12/17/13 255 lb 1.9 oz (115.722 kg)  12/12/13 251 lb 12.8 oz (114.216 kg)  11/27/13 245 lb (111.131 kg)  11/05/13 242 lb (109.77 kg)  10/30/13 242 lb (109.77 kg)  10/09/13 246 lb 6.4 oz (111.766 kg)  10/02/13 256 lb (116.121 kg)  09/18/13 256 lb 13.4 oz (116.5 kg)  09/09/13 255 lb (115.667 kg)  09/04/13 251 lb 1.9 oz (113.907 kg)    Body mass index is 36.76 kg/(m^2). Patient meets criteria for Obesity Class II based on current BMI.   Current diet order is Heart Healthy/Carbohydrate Modified, patient is consuming approximately 75% of meals at this time. Labs and medications reviewed.   No nutrition interventions warranted at this time. If nutrition issues arise, please consult RD.   Arthur Holms, RD, LDN Pager #: [phone] After-Hours Pager #: [phone]

## 2014-02-18 NOTE — Progress Notes (Signed)
5 Days Post-Op Procedure(s) (LRB): VIDEO ASSISTED THORACOSCOPY (VATS)/THOROCOTOMY (Right) RESECTION OF MEDIASTINAL MASS (Right) Subjective: OOB in chair Min CT ouput No incsional pain wiill  place CT to water seal and DC tomorrow Cont Milrinone for NICM EF 15% Renal status stable Objective: Vital signs in last 24 hours: Temp:  [97.6 F (36.4 C)-98.9 F (37.2 C)] 98.3 F (36.8 C) (12/15 0700) Pulse Rate:  [72-102] 73 (12/15 0700) Cardiac Rhythm:  [-] Ventricular paced (12/15 0000) Resp:  [19-36] 23 (12/15 0700) BP: (90-150)/(55-92) 121/77 mmHg (12/15 0700) SpO2:  [87 %-99 %] 90 % (12/15 0700) Weight:  [256 lb 2.8 oz (116.2 kg)] 256 lb 2.8 oz (116.2 kg) (12/15 0700)  Hemodynamic parameters for last 24 hours: CVP:  [12 mmHg] 12 mmHg  Intake/Output from previous day: 12/14 0701 - 12/15 0700 In: 701.8 [P.O.:360; I.V.:241.8; IV Piggyback:100] Out: 1250 [Urine:1250] Intake/Output this shift:    Lungs clear  Lab Results:  Recent Labs  02/17/14 0354 02/18/14 0500  WBC 18.1* 11.7*  HGB 10.8* 9.6*  HCT 31.0* 27.7*  PLT 184 211   BMET:  Recent Labs  02/17/14 0354 02/18/14 0500  NA 130* 127*  127*  K 4.1 3.6*  3.5*  CL 93* 89*  88*  CO2 23 25  25   GLUCOSE 111* 331*  339*  BUN 34* 33*  32*  CREATININE 1.85* 1.47*  1.44*  CALCIUM 9.0 8.4  8.4    PT/INR: No results for input(s): LABPROT, INR in the last 72 hours. ABG    Component Value Date/Time   PHART 7.362 02/14/2014 0422   HCO3 21.8 02/14/2014 0422   TCO2 19 02/14/2014 1636   ACIDBASEDEF 3.0* 02/14/2014 0422   O2SAT 58.2 02/18/2014 0509   CBG (last 3)   Recent Labs  02/17/14 0640 02/17/14 1317 02/18/14 0713  GLUCAP 132* 138* 108*    Assessment/Plan: S/P Procedure(s) (LRB): VIDEO ASSISTED THORACOSCOPY (VATS)/THOROCOTOMY (Right) RESECTION OF MEDIASTINAL MASS (Right) EPI dural one more day   LOS: 8 days    [name],[name] 02/18/2014

## 2014-02-19 ENCOUNTER — Inpatient Hospital Stay (HOSPITAL_COMMUNITY): Payer: 59

## 2014-02-19 LAB — CBC
HCT: 29.8 % — ABNORMAL LOW (ref 39.0–52.0)
HEMOGLOBIN: 10.2 g/dL — AB (ref 13.0–17.0)
MCH: 28.3 pg (ref 26.0–34.0)
MCHC: 34.2 g/dL (ref 30.0–36.0)
MCV: 82.5 fL (ref 78.0–100.0)
Platelets: 285 10*3/uL (ref 150–400)
RBC: 3.61 MIL/uL — AB (ref 4.22–5.81)
RDW: 13.7 % (ref 11.5–15.5)
WBC: 13 10*3/uL — ABNORMAL HIGH (ref 4.0–10.5)

## 2014-02-19 LAB — RENAL FUNCTION PANEL
Albumin: 2.7 g/dL — ABNORMAL LOW (ref 3.5–5.2)
Anion gap: 14 (ref 5–15)
BUN: 35 mg/dL — AB (ref 6–23)
CO2: 24 mEq/L (ref 19–32)
Calcium: 9.2 mg/dL (ref 8.4–10.5)
Chloride: 88 mEq/L — ABNORMAL LOW (ref 96–112)
Creatinine, Ser: 1.46 mg/dL — ABNORMAL HIGH (ref 0.50–1.35)
GFR calc Af Amer: 61 mL/min — ABNORMAL LOW (ref >90)
GFR calc non Af Amer: 53 mL/min — ABNORMAL LOW (ref >90)
GLUCOSE: 101 mg/dL — AB (ref 70–99)
PHOSPHORUS: 3.7 mg/dL (ref 2.3–4.6)
Potassium: 3.7 mEq/L (ref 3.7–5.3)
Sodium: 126 mEq/L — ABNORMAL LOW (ref 137–147)

## 2014-02-19 LAB — GLUCOSE, CAPILLARY
Glucose-Capillary: 123 mg/dL — ABNORMAL HIGH (ref 70–99)
Glucose-Capillary: 118 mg/dL — ABNORMAL HIGH (ref 70–99)
Glucose-Capillary: 125 mg/dL — ABNORMAL HIGH (ref 70–99)

## 2014-02-19 LAB — PARATHYROID HORMONE, INTACT (NO CA): PTH: 96 pg/mL — ABNORMAL HIGH (ref 14–64)

## 2014-02-19 MED ORDER — CARVEDILOL 3.125 MG PO TABS
3.1250 mg | ORAL_TABLET | Freq: Two times a day (BID) | ORAL | Status: DC
  Administered 2014-02-19 – 2014-02-23 (×8): 3.125 mg via ORAL
  Filled 2014-02-19 (×10): qty 1

## 2014-02-19 MED ORDER — MILRINONE IN DEXTROSE 20 MG/100ML IV SOLN
0.1250 ug/kg/min | INTRAVENOUS | Status: DC
  Administered 2014-02-19 – 2014-02-20 (×2): 0.125 ug/kg/min via INTRAVENOUS
  Filled 2014-02-19 (×2): qty 100

## 2014-02-19 MED ORDER — ALLOPURINOL 300 MG PO TABS
500.0000 mg | ORAL_TABLET | Freq: Every day | ORAL | Status: DC
  Administered 2014-02-19 – 2014-02-23 (×5): 500 mg via ORAL
  Filled 2014-02-19 (×5): qty 2

## 2014-02-19 MED ORDER — COLCHICINE 0.6 MG PO TABS
0.6000 mg | ORAL_TABLET | Freq: Every day | ORAL | Status: DC
  Filled 2014-02-19: qty 1

## 2014-02-19 MED ORDER — HYDROMORPHONE HCL 2 MG PO TABS
2.0000 mg | ORAL_TABLET | ORAL | Status: DC | PRN
  Administered 2014-02-19: 2 mg via ORAL
  Filled 2014-02-19: qty 1

## 2014-02-19 MED ORDER — COLCHICINE 0.6 MG PO TABS
0.6000 mg | ORAL_TABLET | Freq: Two times a day (BID) | ORAL | Status: DC
Start: 2014-02-19 — End: 2014-02-23
  Administered 2014-02-19 – 2014-02-23 (×9): 0.6 mg via ORAL
  Filled 2014-02-19 (×10): qty 1

## 2014-02-19 NOTE — Progress Notes (Signed)
Subjective: Interval History: has complaints gout flare. .  Objective: Vital signs in last 24 hours: Temp:  [98.3 F (36.8 C)-98.9 F (37.2 C)] 98.3 F (36.8 C) (12/16 0714) Pulse Rate:  [71-84] 73 (12/16 0700) Resp:  [13-30] 22 (12/16 0700) BP: (90-144)/(58-98) 107/69 mmHg (12/16 0700) SpO2:  [91 %-100 %] 95 % (12/16 0700) Weight:  [117.2 kg (258 lb 6.1 oz)] 117.2 kg (258 lb 6.1 oz) (12/16 0500) Weight change: 1 kg (2 lb 3.3 oz)  Intake/Output from previous day: 12/15 0701 - 12/16 0700 In: 999.2 [P.O.:400; I.V.:399.2; IV Piggyback:200] Out: 1540 [Urine:1500; Chest Tube:40] Intake/Output this shift:    General appearance: alert, cooperative and moderately obese Resp: diminished breath sounds RLL and rales bibasilar Chest wall: CT on R Cardio: S1, S2 normal and systolic murmur: holosystolic 2/6, blowing at apex GI: obese, pos bs, liver down 6 cm Extremities: edema 2+  Lab Results:  Recent Labs  02/18/14 0840 02/19/14 0806  WBC 12.4* 13.0*  HGB 9.9* 10.2*  HCT 29.1* 29.8*  PLT 242 285   BMET:  Recent Labs  02/18/14 0840 02/19/14 0806  NA 131* 126*  K 4.3 3.7  CL 93* 88*  CO2 26 24  GLUCOSE 101* 101*  BUN 34* 35*  CREATININE 1.47* 1.46*  CALCIUM 9.0 9.2   No results for input(s): PTH in the last 72 hours. Iron Studies:  Recent Labs  02/18/14 0500  IRON 21*  TIBC 169*    Studies/Results: Dg Chest Port 1 View  02/19/2014   CLINICAL DATA:  Shortness of breath, status post VATS on 02/15/2014.  EXAM: PORTABLE CHEST - 1 VIEW  COMPARISON:  02/18/2014 and CT chest 01/15/2014.  FINDINGS: Trachea is midline. Heart is enlarged. Pacemaker and ICD lead tips are stable in position. Right IJ central line tip projects over the SVC. Right chest tube remains in place with tip at the medial right apex. Postoperative changes and volume loss in the right hemi thorax with resection of a right paratracheal paraganglioma. Mild scattered atelectasis in the right lung. Difficult  to exclude a small right apical pneumothorax. Left lung is clear. Surgical skin staples project over the right chest, laterally.  IMPRESSION: 1. Postoperative changes of right paratracheal paraganglioma resection with scattered atelectasis in the right lung. 2. Difficult to exclude a small right apical pneumothorax with right chest tube in place.   Electronically Signed   By: Lorin Picket M.D.   On: 02/19/2014 07:52   Dg Chest Port 1 View  02/18/2014   CLINICAL DATA:  VATS  EXAM: PORTABLE CHEST - 1 VIEW  COMPARISON:  02/17/2014.  FINDINGS: Right IJ line in stable position. Right chest tube in stable position. Surgical clips right chest. Cardiomegaly. Cardiac pacer noted with lead tips in right atrium and right ventricle. Pulmonary vascularity normal. Patchy bilateral pulmonary infiltrates. No pneumothorax. No acute bony abnormality  IMPRESSION: 1. Right IJ line and right chest tube in stable position. No pneumothorax. 2. Patchy infiltrates in the right mid and lower lung as well as the left lower lung. 3. Stable cardiomegaly.  Cardiac pacer is stable. 4. Postsurgical changes right chest.   Electronically Signed   By: Marcello Moores  Register   On: 02/18/2014 07:53    I have reviewed the patient's current medications.  Assessment/Plan: 1 CKD 3-4  Cr stable.  SNa low with pain meds and not getting Furosemide.  Edema. 2 BP low lower Coreg 3 gout change to ^ dose Alllopurinol as UA still ^ 4 Paraganglioma 5  Obesity 6 CM P Allopurinol, colchicine, will s/o for now, and see again at your request.    LOS: 9 days   Arshan Jabs L 02/19/2014,9:33 AM

## 2014-02-19 NOTE — Progress Notes (Signed)
Physical Therapy Treatment Patient Details Name: TREVIN GARTRELL MRN: [id number] DOB: Feb 25, 1961 Today's Date: 02/19/2014    History of Present Illness 53 y.o. male s/p VATS procedure with removal of mediasintal mass on Rt. PMHx of non ischemic cardiomyopathy (LVEF 03%), chronic systolic CHF, CKD, and paroxysmal VT s/p AICD. Gout flare right knee    PT Comments    Pt progressing towards goals, despite right knee pain from gout. RW helped pt keep wt off right side to allow for ambulation. Pt also worked on seated and standing postural exercises. PT will continue to follow.   Follow Up Recommendations  Home health PT;Supervision/Assistance - 24 hour     Equipment Recommendations  Rolling walker with 5" wheels;3in1 (PT)    Recommendations for Other Services OT consult     Precautions / Restrictions Precautions Precautions: Fall Restrictions Weight Bearing Restrictions: No    Mobility  Bed Mobility               General bed mobility comments: pt received on BSC  Transfers Overall transfer level: Needs assistance Equipment used: Rolling walker (2 wheeled) Transfers: Sit to/from Stand Sit to Stand: Min assist         General transfer comment: min-guard from higher BSC, min A from recliner due to knee pain  Ambulation/Gait Ambulation/Gait assistance: Min guard Ambulation Distance (Feet): 150 Feet Assistive device: Rolling walker (2 wheeled) Gait Pattern/deviations: Step-through pattern;Decreased weight shift to right;Decreased stance time - right Gait velocity: decreased   General Gait Details: used RW to take wt off right LE making gait tolerable. VSS on RA, 1 standing rest break x2 mins.   Stairs            Wheelchair Mobility    Modified Rankin (Stroke Patients Only)       Balance Overall balance assessment: Needs assistance Sitting-balance support: No upper extremity supported;Feet supported Sitting balance-Leahy Scale: Good     Standing  balance support: No upper extremity supported;During functional activity Standing balance-Leahy Scale: Fair Standing balance comment: has difficulty standing without UE support due to right knee pain                    Cognition Arousal/Alertness: Awake/alert Behavior During Therapy: WFL for tasks assessed/performed Overall Cognitive Status: Within Functional Limits for tasks assessed                      Exercises General Exercises - Lower Extremity Mini-Sqauts: AROM;5 reps Other Exercises Other Exercises: discussed and practiced UE ROM exercises and standing postural exercises    General Comments        Pertinent Vitals/Pain Pain Assessment: Faces Faces Pain Scale: Hurts little more Pain Location: right knee Pain Intervention(s): Monitored during session;Limited activity within patient's tolerance  VSS on RA    Home Living                      Prior Function            PT Goals (current goals can now be found in the care plan section) Acute Rehab PT Goals Patient Stated Goal: Go home PT Goal Formulation: With patient Time For Goal Achievement: 03/02/14 Potential to Achieve Goals: Good Progress towards PT goals: Progressing toward goals    Frequency  Min 3X/week    PT Plan Current plan remains appropriate    Co-evaluation             End of Session Equipment  Utilized During Treatment: Gait belt Activity Tolerance: Patient tolerated treatment well Patient left: in chair;with call bell/phone within reach     Time: 8406-9861 PT Time Calculation (min) (ACUTE ONLY): 41 min  Charges:  $Gait Training: 8-22 mins $Therapeutic Exercise: 23-37 mins $Therapeutic Activity: 38-52 mins                    G Codes:     Leighton Roach, PT  Acute Rehab Services  [phone]  Leighton Roach 02/19/2014, 5:01 PM

## 2014-02-19 NOTE — Progress Notes (Signed)
  [name] is POD #6 s/p right thoracotomy with resection of a peritracheal mass managed post operatively with a T6/7 epidural. Patient endorses adequate pain control with epidural infusion held and transition to PO pain meds   Blood pressure 118/63, pulse 83, temperature 37.1 C, temperature source Oral, resp. rate 20, height 5\' 10"  (1.778 m), weight 258 lb 6.1 oz (117.2 kg), SpO2 97 %.  A+O RRR CTAB with dressing over right chest, chest tube Epidural site with scant old heme at insertion site, no erythema or tenderness to palpation, dressing intact  A/P: 53 yo M s/p right thoracotomy with resection of peritracheal mass. Epidural removed with tip intact. Heparin sq to be given 1 hour after removal (1536)   Oleta Mouse, MD 02/19/2014 2:34 PM

## 2014-02-19 NOTE — Progress Notes (Signed)
6 Days Post-Op Procedure(s) (LRB): VIDEO ASSISTED THORACOSCOPY (VATS)/THOROCOTOMY (Right) RESECTION OF MEDIASTINAL MASS (Right) Subjective: C/o gout in R knee CXR clear- will DC CT today and stop epidural, move to stepdown  Objective: Vital signs in last 24 hours: Temp:  [98.3 F (36.8 C)-98.9 F (37.2 C)] 98.3 F (36.8 C) (12/16 0714) Pulse Rate:  [71-93] 73 (12/16 0700) Cardiac Rhythm:  [-] Ventricular paced (12/16 0604) Resp:  [13-30] 22 (12/16 0700) BP: (90-144)/(53-98) 107/69 mmHg (12/16 0700) SpO2:  [85 %-100 %] 95 % (12/16 0700) Weight:  [258 lb 6.1 oz (117.2 kg)] 258 lb 6.1 oz (117.2 kg) (12/16 0500)  Hemodynamic parameters for last 24 hours: CVP:  [2 mmHg-10 mmHg] 2 mmHg  Intake/Output from previous day: 12/15 0701 - 12/16 0700 In: 999.2 [P.O.:400; I.V.:399.2; IV Piggyback:200] Out: 1540 [Urine:1500; Chest Tube:40] Intake/Output this shift:    Coarse breath sounds  Lab Results:  Recent Labs  02/18/14 0840 02/19/14 0806  WBC 12.4* 13.0*  HGB 9.9* 10.2*  HCT 29.1* 29.8*  PLT 242 285   BMET:  Recent Labs  02/18/14 0500 02/18/14 0840  NA 127*  127* 131*  K 3.6*  3.5* 4.3  CL 89*  88* 93*  CO2 25  25 26   GLUCOSE 331*  339* 101*  BUN 33*  32* 34*  CREATININE 1.47*  1.44* 1.47*  CALCIUM 8.4  8.4 9.0    PT/INR: No results for input(s): LABPROT, INR in the last 72 hours. ABG    Component Value Date/Time   PHART 7.362 02/14/2014 0422   HCO3 21.8 02/14/2014 0422   TCO2 19 02/14/2014 1636   ACIDBASEDEF 3.0* 02/14/2014 0422   O2SAT 58.2 02/18/2014 0509   CBG (last 3)   Recent Labs  02/18/14 1107 02/18/14 1833 02/18/14 2320  GLUCAP 123* 115* 122*    Assessment/Plan: S/P Procedure(s) (LRB): VIDEO ASSISTED THORACOSCOPY (VATS)/THOROCOTOMY (Right) RESECTION OF MEDIASTINAL MASS (Right) Plan for transfer to step-down: see transfer orders   LOS: 9 days    [name],[name] 02/19/2014

## 2014-02-19 NOTE — Progress Notes (Signed)
CT surgery p.m. Rounds  Patient has stable day Epidural and he is to provide excellent pain relief from right thoracotomy We'll plan on removing right chest tube in a.m. Patient developing symptoms of gout in his left knee We'll start colchicine and reduce Lasix dosing

## 2014-02-19 NOTE — Progress Notes (Signed)
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    He has had stable NYHA III symptoms. Was admitted several months for a Chamberlainprocedure to biopsy the mass. Post-op he developed low-output HF a/b acute renal failure requiring milrinone support. He was admitted 12/7 for pre-operative optimization prior to sternotomy for mass excision 12/10.  Underwent VATs with resection of mediastinal mass (12/11).   Remains on milrinone 0.25 mcg. Yesterday he continued to diurese with IV lasix. CVP down to 3-5.   Denies SOB/Orthopnea. + gout pain in R knee. Started on allopurinol and colchicine.    Creatinine 1.4     Objective:   Weight Range:  Vital Signs:   Temp:  [98.3 F (36.8 C)-98.9 F (37.2 C)] 98.3 F (36.8 C) (12/16 0714) Pulse Rate:  [71-84] 73 (12/16 0700) Resp:  [13-30] 22 (12/16 0700) BP: (90-144)/(58-98) 107/69 mmHg (12/16 0700) SpO2:  [91 %-100 %] 95 % (12/16 0700) Weight:  [258 lb 6.1 oz (117.2 kg)] 258 lb 6.1 oz (117.2 kg) (12/16 0500) Last BM Date: 02/16/14  Weight change: Filed Weights   02/17/14 0600 02/18/14 0700 02/19/14 0500  Weight: 257 lb 11.5 oz (116.9 kg) 256 lb 2.8 oz (116.2 kg) 258 lb 6.1 oz (117.2 kg)    Intake/Output:   Intake/Output Summary (Last 24 hours) at 02/19/14 0912 Last data filed at 02/19/14 0017  Gross per 24 hour  Intake    762 ml  Output   1510 ml  Net   -748 ml     Physical Exam: CVP 3 General: lying in bed No resp difficulty HEENT: normal Neck: supple. RIJ sheath  Cor: PMI laterally displaced. Regular rate & rhythm. No rubs, gallops or murmurs. Lungs: clear Abdomen: soft, nontender, good BS Extremities: no cyanosis, clubbing, rash, RUE PICC. R and  LLE no edema. Ted hose in place Neuro: alert & orientedx3, cranial nerves grossly intact. moves all 4 extremities w/o difficulty. Affect pleasant  Telemetry: SR with v pacing  Labs: Basic Metabolic Panel:  Recent Labs Lab 02/12/14 1750 02/13/14 0500  02/16/14 0545 02/17/14 0354 02/18/14 0500 02/18/14 0840 02/19/14 0806  NA 137 134*  < > 130* 130* 127*  127* 131* 126*  K 4.4 4.3  < > 4.6 4.1 3.6*  3.5* 4.3 3.7  CL 99 97  < > 95* 93* 89*  88* 93* 88*  CO2 24 23  < > 22 23 25  25 26 24   GLUCOSE 142* 108*  < > 94 111* 331*  339* 101* 101*  BUN 27* 25*  < > 29* 34* 33*  32* 34* 35*  CREATININE 1.57* 1.46*  < > 1.94* 1.85* 1.47*  1.44* 1.47* 1.46*  CALCIUM 9.2 9.4  < > 8.5 9.0 8.4  8.4 9.0 9.2  MG 2.0 1.9  --   --   --   --   --   --   PHOS  --   --   --   --   --  3.2 3.5 3.7  < > = values in this interval not displayed.  Liver Function Tests:  Recent Labs Lab 02/12/14 1750 02/15/14 0508 02/16/14 0545 02/17/14 0354 02/18/14 0500 02/18/14 0840 02/19/14 0806  AST 16 36 30 33 36  --   --  ALT 18 13 14 18 19   --   --   ALKPHOS 72 67 63 73 80  --   --   BILITOT 1.1 1.1 0.8 0.6 0.5  --   --   PROT 7.7 6.2 6.1 6.9 6.6  --   --   ALBUMIN 3.5 3.0* 2.7* 2.8* 2.6*  2.6* 2.8* 2.7*   No results for input(s): LIPASE, AMYLASE in the last 168 hours. No results for input(s): AMMONIA in the last 168 hours.  CBC:  Recent Labs Lab 02/16/14 0545 02/17/14 0354 02/18/14 0500 02/18/14 0840 02/19/14 0806  WBC 18.9* 18.1* 11.7* 12.4* 13.0*  HGB 9.7* 10.8* 9.6* 9.9* 10.2*  HCT 29.1* 31.0* 27.7* 29.1* 29.8*  MCV 83.6 84.9 82.9 83.1 82.5  PLT 132* 184 211 242 285    Cardiac Enzymes: No results for input(s): CKTOTAL, CKMB, CKMBINDEX, TROPONINI in the last 168 hours.  BNP: BNP (last 3 results)  Recent Labs  01/20/14 1414 02/10/14 1755  PROBNP 519.4* 653.7*     Other results:    Imaging: Dg Chest Port 1 View  02/19/2014   CLINICAL DATA:  Shortness of  breath, status post VATS on 02/15/2014.  EXAM: PORTABLE CHEST - 1 VIEW  COMPARISON:  02/18/2014 and CT chest 01/15/2014.  FINDINGS: Trachea is midline. Heart is enlarged. Pacemaker and ICD lead tips are stable in position. Right IJ central line tip projects over the SVC. Right chest tube remains in place with tip at the medial right apex. Postoperative changes and volume loss in the right hemi thorax with resection of a right paratracheal paraganglioma. Mild scattered atelectasis in the right lung. Difficult to exclude a small right apical pneumothorax. Left lung is clear. Surgical skin staples project over the right chest, laterally.  IMPRESSION: 1. Postoperative changes of right paratracheal paraganglioma resection with scattered atelectasis in the right lung. 2. Difficult to exclude a small right apical pneumothorax with right chest tube in place.   Electronically Signed   By: Lorin Picket M.D.   On: 02/19/2014 07:52   Dg Chest Port 1 View  02/18/2014   CLINICAL DATA:  VATS  EXAM: PORTABLE CHEST - 1 VIEW  COMPARISON:  02/17/2014.  FINDINGS: Right IJ line in stable position. Right chest tube in stable position. Surgical clips right chest. Cardiomegaly. Cardiac pacer noted with lead tips in right atrium and right ventricle. Pulmonary vascularity normal. Patchy bilateral pulmonary infiltrates. No pneumothorax. No acute bony abnormality  IMPRESSION: 1. Right IJ line and right chest tube in stable position. No pneumothorax. 2. Patchy infiltrates in the right mid and lower lung as well as the left lower lung. 3. Stable cardiomegaly.  Cardiac pacer is stable. 4. Postsurgical changes right chest.   Electronically Signed   By: Marcello Moores  Register   On: 02/18/2014 07:53     Medications:     Scheduled Medications: . antiseptic oral rinse  7 mL Mouth Rinse BID  . atorvastatin  40 mg Oral q1800  . bisacodyl  10 mg Oral Daily  . carvedilol  6.25 mg Oral BID WC  . cefTAZidime (FORTAZ)  IV  1 g Intravenous 3  times per day  . colchicine  0.6 mg Oral BID  . digoxin  0.125 mg Oral Q48H  . febuxostat  80 mg Oral QHS  . furosemide  80 mg Intravenous BID  . heparin  5,000 Units Subcutaneous 3 times per day  . insulin aspart  0-24 Units Subcutaneous 4 times per day  . levothyroxine  150 mcg Oral QAC breakfast  . magnesium oxide  400 mg Oral BID  . pantoprazole  40 mg Oral Daily  . senna-docusate  1 tablet Oral QHS  . tobramycin-dexamethasone  2 drop Right Eye BID    Infusions: . dextrose 5 % and 0.9% NaCl 10 mL/hr at 02/19/14 0000  . milrinone 0.25 mcg/kg/min (02/19/14 0000)  . ropivacaine (PF) 2 mg/ml (0.2%) 8 mL/hr (02/19/14 0445)    PRN Medications: fentaNYL, HYDROmorphone, ondansetron (ZOFRAN) IV   Assessment:   1. Chronic systolic HF due to NICM  --with episode of low output HF requiring milrinone support this summer 2. 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 Acute on chronic renal failure, stage III 4. Hypomagenesemia 5. Polycythemia 6. Acute gout  Plan/Discussion:    CVP down to 2. Hold diurectics today. Continue milrinone 0.25 mcg . Continue current dose of dig and coreg. Creatinine unchanged from yesterday 1.4   Transferring to stepdown today.   [name],AMY NP-C  9:12 AM   Patient seen and examined with Darrick Grinder, NP. We discussed all aspects of the encounter. I agree with the assessment and plan as stated above.   Volume status much improved. Can switch diuretics to po. Treat gout with colchicine. Can give a dose of steroids if absolutely needed.   Wean milrinone to 0.125.  Jamie Sarkis,MD 11:56 AM

## 2014-02-19 NOTE — Progress Notes (Signed)
Up in chair  Comfortable with PO pain meds  BP 143/81 mmHg  Pulse 98  Temp(Src) 98.7 F (37.1 C) (Oral)  Resp 24  Ht 5\' 10"  (1.778 m)  Wt 258 lb 6.1 oz (117.2 kg)  BMI 37.07 kg/m2  SpO2 95%   Intake/Output Summary (Last 24 hours) at 02/19/14 1750 Last data filed at 02/19/14 1700  Gross per 24 hour  Intake 1003.6 ml  Output   3080 ml  Net -2076.4 ml    Awaiting step down bed

## 2014-02-20 ENCOUNTER — Inpatient Hospital Stay (HOSPITAL_COMMUNITY): Payer: 59

## 2014-02-20 DIAGNOSIS — I5023 Acute on chronic systolic (congestive) heart failure: Secondary | ICD-10-CM

## 2014-02-20 LAB — CBC
HCT: 31.4 % — ABNORMAL LOW (ref 39.0–52.0)
Hemoglobin: 10.7 g/dL — ABNORMAL LOW (ref 13.0–17.0)
MCH: 28.2 pg (ref 26.0–34.0)
MCHC: 34.1 g/dL (ref 30.0–36.0)
MCV: 82.8 fL (ref 78.0–100.0)
PLATELETS: 318 10*3/uL (ref 150–400)
RBC: 3.79 MIL/uL — AB (ref 4.22–5.81)
RDW: 13.9 % (ref 11.5–15.5)
WBC: 14.7 10*3/uL — AB (ref 4.0–10.5)

## 2014-02-20 LAB — BASIC METABOLIC PANEL
Anion gap: 11 (ref 5–15)
BUN: 32 mg/dL — AB (ref 6–23)
CHLORIDE: 88 meq/L — AB (ref 96–112)
CO2: 29 mEq/L (ref 19–32)
Calcium: 9.3 mg/dL (ref 8.4–10.5)
Creatinine, Ser: 1.37 mg/dL — ABNORMAL HIGH (ref 0.50–1.35)
GFR calc Af Amer: 66 mL/min — ABNORMAL LOW (ref >90)
GFR, EST NON AFRICAN AMERICAN: 57 mL/min — AB (ref >90)
GLUCOSE: 130 mg/dL — AB (ref 70–99)
POTASSIUM: 3.8 meq/L (ref 3.7–5.3)
SODIUM: 128 meq/L — AB (ref 137–147)

## 2014-02-20 LAB — GLUCOSE, CAPILLARY
Glucose-Capillary: 110 mg/dL — ABNORMAL HIGH (ref 70–99)
Glucose-Capillary: 114 mg/dL — ABNORMAL HIGH (ref 70–99)
Glucose-Capillary: 115 mg/dL — ABNORMAL HIGH (ref 70–99)
Glucose-Capillary: 116 mg/dL — ABNORMAL HIGH (ref 70–99)
Glucose-Capillary: 143 mg/dL — ABNORMAL HIGH (ref 70–99)
Glucose-Capillary: 150 mg/dL — ABNORMAL HIGH (ref 70–99)
Glucose-Capillary: 166 mg/dL — ABNORMAL HIGH (ref 70–99)

## 2014-02-20 MED ORDER — METHYLPREDNISOLONE SODIUM SUCC 40 MG IJ SOLR
40.0000 mg | Freq: Once | INTRAMUSCULAR | Status: AC
  Administered 2014-02-20: 40 mg via INTRAVENOUS
  Filled 2014-02-20 (×2): qty 1

## 2014-02-20 NOTE — Addendum Note (Signed)
Addendum  created 02/20/14 1107 by Laurie Panda, MD   Modules edited: PRL Based Order Sets

## 2014-02-20 NOTE — Progress Notes (Signed)
   02/20/14 1400  Clinical Encounter Type  Visited With Patient  Visit Type Initial;Spiritual support;Social support   Chaplain visited with patient briefly today. Patient was in good spirits but hope he gets to return home before Christmas. Chaplain made patient aware of support that a chaplain can provided in the event that the patient wishes to utilize this support in the future. Patient will continue to provide emotional and spiritual support for patient as needed. Graham Hyun, Claudius Sis, Chaplain 2:02 PM

## 2014-02-20 NOTE — Progress Notes (Addendum)
      [address]       [place] [address]             [phone]       7 Days Post-Op Procedure(s) (LRB): VIDEO ASSISTED THORACOSCOPY (VATS)/THOROCOTOMY (Right) RESECTION OF MEDIASTINAL MASS (Right)  Subjective: Patient sitting in chair. Had a bowel movement earlier this am. Still with right knee pain (gout)  Objective: Vital signs in last 24 hours: Temp:  [98.3 F (36.8 C)-98.8 F (37.1 C)] 98.4 F (36.9 C) (12/17 0400) Pulse Rate:  [72-98] 92 (12/17 0600) Cardiac Rhythm:  [-] Ventricular paced (12/16 2000) Resp:  [17-29] 21 (12/17 0600) BP: (99-143)/(58-86) 135/74 mmHg (12/17 0600) SpO2:  [95 %-100 %] 100 % (12/17 0600) Weight:  [246 lb 4.1 oz (111.7 kg)] 246 lb 4.1 oz (111.7 kg) (12/17 0400)   Hemodynamic parameters for last 24 hours: CVP:  [2 mmHg-3 mmHg] 2 mmHg  Intake/Output from previous day: 12/16 0701 - 12/17 0700 In: 924.4 [P.O.:480; I.V.:344.4; IV Piggyback:100] Out: 3545 [Urine:3545]   Physical Exam:  Cardiovascular: RRR, no murmurs, gallops, or rubs. Pulmonary: Slightly diminished at bases; no rales, wheezes, or rhonchi. Abdomen: Soft, non tender, bowel sounds present. Extremities: Ted hose in place Wounds: Clean and dry.  No erythema or signs of infection.   Lab Results: CBC: Recent Labs  02/18/14 0840 02/19/14 0806  WBC 12.4* 13.0*  HGB 9.9* 10.2*  HCT 29.1* 29.8*  PLT 242 285   BMET:  Recent Labs  02/18/14 0840 02/19/14 0806  NA 131* 126*  K 4.3 3.7  CL 93* 88*  CO2 26 24  GLUCOSE 101* 101*  BUN 34* 35*  CREATININE 1.47* 1.46*  CALCIUM 9.0 9.2    PT/INR: No results for input(s): LABPROT, INR in the last 72 hours. ABG:  INR: Will add last result for INR, ABG once components are confirmed Will add last 4 CBG results once components are confirmed  Assessment/Plan:  1. CV - CHF due to non ischemic cardiomyopathy (LVEF 15%), history of CRT-D implantation. On Milrinone drip 0.125, Coreg 3.125 bid, Digoxin  0.125 daily. 2.  Pulmonary - CXR shows patient rotated to the right, cardiomegaly, right base atelectasis, and no pneumothorax. On Fortaz.Encourage incentive spirometer 3. Gout-continue allopurinol and colchicine. Consider steroid if no improvement. Per Dr. Prescott Gum, will give Solumedrol 40 mg IV once 4. Volume overload- Lasix per heart failure team 5. Anemia-H and H yesterday stable at 10.2 and 29.8 6. Acute on chronic renal failure (stage III)Creatinine stable at 1.46  ZIMMERMAN,DONIELLE MPA-C 02/20/2014,7:51 AM  Plan transfer to 2 west after milrinone is off CXR clear after final chest tube out patient examined and medical record reviewed,agree with above note. [name],PETER 02/21/2014

## 2014-02-20 NOTE — Progress Notes (Signed)
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   He has had stable NYHA III symptoms. Was admitted several months for a Chamberlainprocedure to biopsy the mass. Post-op he developed low-output HF a/b acute renal failure requiring milrinone support. He was admitted 12/7 for pre-operative optimization prior to sternotomy for mass excision 12/10.  Underwent VATs with resection of mediastinal mass (12/11).   Remains on milrinone 0.125 mcg. Switched to PO lasix yesterday, UOP has been increased and weight supposedly down about 12 lbs?  Objective:   Vital Signs:   Temp:  [98.2 F (36.8 C)-98.8 F (37.1 C)] 98.2 F (36.8 C) (12/17 0842) Pulse Rate:  [80-98] 90 (12/17 0842) Resp:  [17-29] 18 (12/17 0842) BP: (99-143)/(58-88) 131/88 mmHg (12/17 0842) SpO2:  [95 %-100 %] 96 % (12/17 0842) Weight:  [246 lb 4.1 oz (111.7 kg)] 246 lb 4.1 oz (111.7 kg) (12/17 0400) Last BM Date: 02/16/14  Weight change: Filed Weights   02/18/14 0700 02/19/14 0500 02/20/14 0400  Weight: 256 lb 2.8 oz (116.2 kg) 258 lb 6.1 oz (117.2 kg) 246 lb 4.1 oz (111.7 kg)    Intake/Output:   Intake/Output Summary (Last 24 hours) at 02/20/14 1028 Last data filed at 02/20/14 0844  Gross per 24 hour  Intake 389.15 ml  Output   2845 ml  Net -2455.85 ml     Physical Exam: CVP 3 General: Comfortable, NAD HEENT: normal Neck: supple. RIJ sheath  Cor: PMI laterally displaced. Regular rate & rhythm. No rubs, gallops or murmurs. Lungs: clear Abdomen: soft, nontender, good BS Extremities: no cyanosis, clubbing, rash, RUE PICC. R and LLE no edema. Ted hose in place Neuro: alert & orientedx3, cranial nerves grossly intact. moves all 4 extremities  w/o difficulty. Affect pleasant  Telemetry: NSR with v pacing  Labs: Basic Metabolic Panel:  Recent Labs Lab 02/16/14 0545 02/17/14 0354 02/18/14 0500 02/18/14 0840 02/19/14 0806  NA 130* 130* 127*  127* 131* 126*  K 4.6 4.1 3.6*  3.5* 4.3 3.7  CL 95* 93* 89*  88* 93* 88*  CO2 22 23 25  25 26 24   GLUCOSE 94 111* 331*  339* 101* 101*  BUN 29* 34* 33*  32* 34* 35*  CREATININE 1.94* 1.85* 1.47*  1.44* 1.47* 1.46*  CALCIUM 8.5 9.0 8.4  8.4 9.0 9.2  PHOS  --   --  3.2 3.5 3.7    Liver Function Tests:  Recent Labs Lab 02/15/14 0508 02/16/14 0545 02/17/14 0354 02/18/14 0500 02/18/14 0840 02/19/14 0806  AST 36 30 33 36  --   --   ALT 13 14 18 19   --   --   ALKPHOS 67 63 73 80  --   --   BILITOT 1.1 0.8 0.6 0.5  --   --   PROT 6.2 6.1 6.9 6.6  --   --   ALBUMIN 3.0* 2.7* 2.8* 2.6*  2.6* 2.8* 2.7*   No results for input(s): LIPASE, AMYLASE in the last 168 hours. No results for input(s): AMMONIA in the last 168 hours.  CBC:  Recent Labs Lab 02/16/14 0545 02/17/14 0354 02/18/14 0500 02/18/14 0840 02/19/14 0806  WBC 18.9* 18.1* 11.7* 12.4* 13.0*  HGB 9.7* 10.8* 9.6* 9.9* 10.2*  HCT 29.1*  31.0* 27.7* 29.1* 29.8*  MCV 83.6 84.9 82.9 83.1 82.5  PLT 132* 184 211 242 285    Cardiac Enzymes: No results for input(s): CKTOTAL, CKMB, CKMBINDEX, TROPONINI in the last 168 hours.  BNP: BNP (last 3 results)  Recent Labs  01/20/14 1414 02/10/14 1755  PROBNP 519.4* 653.7*     Other results:    Imaging: Dg Chest Port 1 View  02/20/2014   CLINICAL DATA:  Shortness of breath  EXAM: PORTABLE CHEST - 1 VIEW  COMPARISON:  02/19/2014  FINDINGS: Unchanged positioning of dual-chamber ICD/ pacer leads. Stable positioning of right IJ catheter. A right-sided thoracic drain has been removed. Chronic cardiomegaly and upper mediastinal widening. No evidence of pneumothorax or increasing pleural fluid. Atelectasis on the right is unchanged.  IMPRESSION: 1. No  pneumothorax after thoracic drain removal. 2. Unchanged subsegmental atelectasis on the right.   Electronically Signed   By: Jorje Guild M.D.   On: 02/20/2014 06:17   Dg Chest Port 1 View  02/19/2014   CLINICAL DATA:  Shortness of breath, status post VATS on 02/15/2014.  EXAM: PORTABLE CHEST - 1 VIEW  COMPARISON:  02/18/2014 and CT chest 01/15/2014.  FINDINGS: Trachea is midline. Heart is enlarged. Pacemaker and ICD lead tips are stable in position. Right IJ central line tip projects over the SVC. Right chest tube remains in place with tip at the medial right apex. Postoperative changes and volume loss in the right hemi thorax with resection of a right paratracheal paraganglioma. Mild scattered atelectasis in the right lung. Difficult to exclude a small right apical pneumothorax. Left lung is clear. Surgical skin staples project over the right chest, laterally.  IMPRESSION: 1. Postoperative changes of right paratracheal paraganglioma resection with scattered atelectasis in the right lung. 2. Difficult to exclude a small right apical pneumothorax with right chest tube in place.   Electronically Signed   By: Lorin Picket M.D.   On: 02/19/2014 07:52     Medications:     Scheduled Medications: . allopurinol  500 mg Oral Daily  . antiseptic oral rinse  7 mL Mouth Rinse BID  . atorvastatin  40 mg Oral q1800  . bisacodyl  10 mg Oral Daily  . carvedilol  3.125 mg Oral BID WC  . cefTAZidime (FORTAZ)  IV  1 g Intravenous 3 times per day  . colchicine  0.6 mg Oral BID  . digoxin  0.125 mg Oral Q48H  . heparin  5,000 Units Subcutaneous 3 times per day  . insulin aspart  0-24 Units Subcutaneous 4 times per day  . levothyroxine  150 mcg Oral QAC breakfast  . magnesium oxide  400 mg Oral BID  . pantoprazole  40 mg Oral Daily  . senna-docusate  1 tablet Oral QHS  . tobramycin-dexamethasone  2 drop Right Eye BID    Infusions: . dextrose 5 % and 0.9% NaCl 10 mL/hr at 02/19/14 1302  . milrinone  0.125 mcg/kg/min (02/20/14 0549)  . ropivacaine (PF) 2 mg/ml (0.2%) 8 mL/hr (02/19/14 0445)    PRN Medications: fentaNYL, HYDROmorphone, ondansetron (ZOFRAN) IV   Assessment:   1. Chronic systolic HF due to NICM  --with episode of low output HF requiring milrinone support this summer 2. 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 Acute on chronic renal failure, stage III 4. Hypomagenesemia 5. Polycythemia 6. Acute gout  Plan/Discussion:    Continue current Coreg/Dig.  Continue to wean milrinone and evaluate S/S along with fluid status weight  [name]  10:28 AM  Patient seen and examined with Dr. Awanda Mink. We discussed all aspects of the encounter. I agree with the assessment and plan as stated above. He continues to improve. Renal function and volume status are better. Will stop milrinone. Check co-ox in am.  Resume home diuretics tomorrow.   [name] 7:48 PM

## 2014-02-21 ENCOUNTER — Inpatient Hospital Stay (HOSPITAL_COMMUNITY): Payer: 59

## 2014-02-21 ENCOUNTER — Other Ambulatory Visit (HOSPITAL_COMMUNITY): Payer: Self-pay | Admitting: Adult Health

## 2014-02-21 LAB — GLUCOSE, CAPILLARY
Glucose-Capillary: 141 mg/dL — ABNORMAL HIGH (ref 70–99)
Glucose-Capillary: 130 mg/dL — ABNORMAL HIGH (ref 70–99)
Glucose-Capillary: 136 mg/dL — ABNORMAL HIGH (ref 70–99)

## 2014-02-21 LAB — CARBOXYHEMOGLOBIN
Carboxyhemoglobin: 1.3 % (ref 0.5–1.5)
Methemoglobin: 0.8 % (ref 0.0–1.5)
O2 Saturation: 58.1 %
Total hemoglobin: 12.7 g/dL — ABNORMAL LOW (ref 13.5–18.0)

## 2014-02-21 LAB — BASIC METABOLIC PANEL
ANION GAP: 13 (ref 5–15)
BUN: 30 mg/dL — ABNORMAL HIGH (ref 6–23)
CO2: 27 mEq/L (ref 19–32)
CREATININE: 1.33 mg/dL (ref 0.50–1.35)
Calcium: 9.7 mg/dL (ref 8.4–10.5)
Chloride: 92 mEq/L — ABNORMAL LOW (ref 96–112)
GFR, EST AFRICAN AMERICAN: 69 mL/min — AB (ref >90)
GFR, EST NON AFRICAN AMERICAN: 59 mL/min — AB (ref >90)
Glucose, Bld: 132 mg/dL — ABNORMAL HIGH (ref 70–99)
Potassium: 4.6 mEq/L (ref 3.7–5.3)
Sodium: 132 mEq/L — ABNORMAL LOW (ref 137–147)

## 2014-02-21 LAB — CBC
HCT: 31.8 % — ABNORMAL LOW (ref 39.0–52.0)
Hemoglobin: 10.9 g/dL — ABNORMAL LOW (ref 13.0–17.0)
MCH: 28.4 pg (ref 26.0–34.0)
MCHC: 34.3 g/dL (ref 30.0–36.0)
MCV: 82.8 fL (ref 78.0–100.0)
PLATELETS: 376 10*3/uL (ref 150–400)
RBC: 3.84 MIL/uL — ABNORMAL LOW (ref 4.22–5.81)
RDW: 14 % (ref 11.5–15.5)
WBC: 19.8 10*3/uL — ABNORMAL HIGH (ref 4.0–10.5)

## 2014-02-21 MED ORDER — ENOXAPARIN SODIUM 40 MG/0.4ML ~~LOC~~ SOLN
40.0000 mg | SUBCUTANEOUS | Status: DC
  Administered 2014-02-21 – 2014-02-22 (×2): 40 mg via SUBCUTANEOUS
  Filled 2014-02-21 (×3): qty 0.4

## 2014-02-21 MED ORDER — ALTEPLASE 2 MG IJ SOLR: 2.0000 mg | Freq: Once | INTRAMUSCULAR | Status: DC

## 2014-02-21 MED ORDER — METHYLPREDNISOLONE SODIUM SUCC 40 MG IJ SOLR
40.0000 mg | Freq: Once | INTRAMUSCULAR | Status: AC
  Administered 2014-02-21: 40 mg via INTRAVENOUS
  Filled 2014-02-21: qty 1

## 2014-02-21 MED ORDER — TORSEMIDE 10 MG PO TABS
10.0000 mg | ORAL_TABLET | ORAL | Status: DC
  Administered 2014-02-22: 10 mg via ORAL
  Filled 2014-02-21 (×2): qty 1

## 2014-02-21 MED ORDER — SODIUM CHLORIDE 0.9 % IJ SOLN
10.0000 mL | Freq: Two times a day (BID) | INTRAMUSCULAR | Status: DC
  Administered 2014-02-21 – 2014-02-22 (×3): 10 mL

## 2014-02-21 MED ORDER — TORSEMIDE 20 MG PO TABS: 20.0000 mg | ORAL_TABLET | ORAL | Status: DC

## 2014-02-21 MED ORDER — LOSARTAN POTASSIUM 25 MG PO TABS
25.0000 mg | ORAL_TABLET | Freq: Every day | ORAL | Status: DC
  Administered 2014-02-21 – 2014-02-23 (×3): 25 mg via ORAL
  Filled 2014-02-21 (×4): qty 1

## 2014-02-21 MED ORDER — SODIUM CHLORIDE 0.9 % IJ SOLN: 10.0000 mL | INTRAMUSCULAR | Status: DC | PRN

## 2014-02-21 NOTE — Progress Notes (Addendum)
      [address]       [place] [address]             [phone]       8 Days Post-Op Procedure(s) (LRB): VIDEO ASSISTED THORACOSCOPY (VATS)/THOROCOTOMY (Right) RESECTION OF MEDIASTINAL MASS (Right)  Subjective: Patient sitting in chair.   Objective: Vital signs in last 24 hours: Temp:  [97.4 F (36.3 C)-99.3 F (37.4 C)] 97.4 F (36.3 C) (12/18 0800) Pulse Rate:  [42-105] 86 (12/18 0800) Cardiac Rhythm:  [-] Ventricular paced (12/18 0800) Resp:  [17-30] 19 (12/18 0800) BP: (124-147)/(51-97) 131/82 mmHg (12/18 0800) SpO2:  [94 %-98 %] 96 % (12/18 0800) Weight:  [245 lb 6.4 oz (111.313 kg)] 245 lb 6.4 oz (111.313 kg) (12/18 0300)   Hemodynamic parameters for last 24 hours: CVP:  [2 mmHg-5 mmHg] 5 mmHg  Intake/Output from previous day: 12/17 0701 - 12/18 0700 In: 920 [P.O.:480; I.V.:240; IV Piggyback:200] Out: 1601 [Urine:1600; Stool:1]   Physical Exam:  Cardiovascular: RRR, no murmurs, gallops, or rubs. Pulmonary: Slightly diminished at bases; no rales, wheezes, or rhonchi. Abdomen: Soft, non tender, bowel sounds present. Extremities: Ted hose in place Wounds: Clean and dry.  No erythema or signs of infection.   Lab Results: CBC:  Recent Labs  02/20/14 1015 02/21/14 0315  WBC 14.7* 19.8*  HGB 10.7* 10.9*  HCT 31.4* 31.8*  PLT 318 376   BMET:   Recent Labs  02/20/14 1015 02/21/14 0315  NA 128* 132*  K 3.8 4.6  CL 88* 92*  CO2 29 27  GLUCOSE 130* 132*  BUN 32* 30*  CREATININE 1.37* 1.33  CALCIUM 9.3 9.7    PT/INR: No results for input(s): LABPROT, INR in the last 72 hours. ABG:  INR: Will add last result for INR, ABG once components are confirmed Will add last 4 CBG results once components are confirmed  Assessment/Plan:  1. CV - CHF due to non ischemic cardiomyopathy (LVEF 15%), history of CRT-D implantation. Milrinone drip stopped yesterday. On Coreg 3.125 bid, Digoxin 0.125 daily. 2.  Pulmonary - CXR being taken  this am. On Fortaz.Encourage incentive spirometer 3. Gout-continue allopurinol and colchicine. Patient requesting another dose of Solumedrol as helped. 4. Volume overload- Lasix per heart failure team 5. Anemia-H and H yesterday stable at 10.9 and 31.8 6. Acute on chronic renal failure (stage III)Creatinine stable at 1.33 7. Will stop heparin SQ and give daily Lovenox as long as epidural stopped. [place] to transfer to 2000 9. Hopefully, home soon  Teale Goodgame MPA-C 02/21/2014,9:06 AM

## 2014-02-21 NOTE — Progress Notes (Signed)
Pt. Has his home cpap. Pt. States he has not been wearing it lately. RT informed pt. To notify if he decides he wants to wear his cpap and needs assistance.

## 2014-02-21 NOTE — Progress Notes (Signed)
Physical Therapy Treatment Patient Details Name: [name] MRN: [id number] DOB: [birth year] Today's Date: 02/21/2014    History of Present Illness 53 y.o. male s/p VATS procedure with removal of mediasintal mass on Rt. PMHx of non ischemic cardiomyopathy (LVEF 99%), chronic systolic CHF, CKD, and paroxysmal VT s/p AICD. Gout flare right knee    PT Comments    Patient tolerated increased ambulation this session, further activity limited secondary to increased knee pain. Will continue to progress as tolerated.  Follow Up Recommendations  Home health PT;Supervision/Assistance - 24 hour     Equipment Recommendations  Rolling walker with 5" wheels;3in1 (PT)    Recommendations for Other Services       Precautions / Restrictions Precautions Precautions: Fall Restrictions Weight Bearing Restrictions: No    Mobility  Bed Mobility               General bed mobility comments: pt received on BSC  Transfers Overall transfer level: Needs assistance Equipment used: Rolling walker (2 wheeled) Transfers: Sit to/from Stand Sit to Stand: Supervision         General transfer comment: No physical assist required  Ambulation/Gait Ambulation/Gait assistance: Supervision Ambulation Distance (Feet): 240 Feet (2 standing rest breaks) Assistive device: Rolling walker (2 wheeled) Gait Pattern/deviations: Step-through pattern;Decreased stride length;Trunk flexed Gait velocity: decreased Gait velocity interpretation: Below normal speed for age/gender General Gait Details: used RW to take wt off right LE making gait tolerable. VSS on RA, 1 standing rest break x2 mins.   Stairs            Wheelchair Mobility    Modified Rankin (Stroke Patients Only)       Balance     Sitting balance-Leahy Scale: Good       Standing balance-Leahy Scale: Fair                      Cognition Arousal/Alertness: Awake/alert Behavior During Therapy: WFL for tasks  assessed/performed Overall Cognitive Status: Within Functional Limits for tasks assessed                      Exercises General Exercises - Lower Extremity Ankle Circles/Pumps: AROM;Both;10 reps;Seated    General Comments        Pertinent Vitals/Pain Pain Assessment: Faces Faces Pain Scale: Hurts little more Pain Location: knee (gout) Pain Intervention(s): Monitored during session;Limited activity within patient's tolerance    Home Living                      Prior Function            PT Goals (current goals can now be found in the care plan section) Acute Rehab PT Goals Patient Stated Goal: Go home PT Goal Formulation: With patient Time For Goal Achievement: 03/02/14 Potential to Achieve Goals: Good Progress towards PT goals: Progressing toward goals    Frequency  Min 3X/week    PT Plan Current plan remains appropriate    Co-evaluation             End of Session Equipment Utilized During Treatment: Gait belt Activity Tolerance: Patient tolerated treatment well Patient left: in chair;with call bell/phone within reach     Time: 1038-1056 PT Time Calculation (min) (ACUTE ONLY): 18 min  Charges:  $Gait Training: 8-22 mins                    G Codes:  Duncan Dull 02/21/2014, 2:03 PM Alben Deeds, [place] DPT  [phone]

## 2014-02-21 NOTE — Progress Notes (Signed)
Called by RN to check CVC. Upon arrival line flushed with GBR. Co-ox drawn and given to pt.'s RN. Line then again flushed and great blood return was noted. RN informed to cancel order for TPA.

## 2014-02-21 NOTE — Progress Notes (Addendum)
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   He has had stable NYHA III symptoms. Was admitted several months for a Chamberlainprocedure to biopsy the mass. Post-op he developed low-output HF a/b acute renal failure requiring milrinone support. He was admitted 12/7 for pre-operative optimization prior to sternotomy for mass excision 12/10.  Underwent VATs with resection of mediastinal mass (12/11).  Milrinone and IV lasix stopped yesterday (CVP was 2). Feels good. Unable to get co-ox this am due to unable to pull from PICC. Off diuretics. Weight down another pound. Renal function stable. CVP 4. SBP 130-140.  Objective:   Vital Signs:   Temp:  [97.4 F (36.3 C)-99.3 F (37.4 C)] 97.4 F (36.3 C) (12/18 0800) Pulse Rate:  [79-105] 86 (12/18 0800) Resp:  [17-30] 19 (12/18 0800) BP: (124-147)/(51-97) 131/82 mmHg (12/18 0800) SpO2:  [94 %-97 %] 96 % (12/18 0800) Weight:  [111.313 kg (245 lb 6.4 oz)] 111.313 kg (245 lb 6.4 oz) (12/18 0300) Last BM Date: 02/20/14  Weight change: Filed Weights   02/19/14 0500 02/20/14 0400 02/21/14 0300  Weight: 117.2 kg (258 lb 6.1 oz) 111.7 kg (246 lb 4.1 oz) 111.313 kg (245 lb 6.4 oz)    Intake/Output:   Intake/Output Summary (Last 24 hours) at 02/21/14 1101 Last data filed at 02/21/14 0600  Gross per 24 hour  Intake    920 ml  Output   1451 ml  Net   -531 ml     Physical Exam: CVP 3 General: Comfortable, NAD HEENT: normal Neck: supple. RIJ sheath  Cor: PMI laterally displaced. Regular rate & rhythm. No rubs, gallops or murmurs. Lungs: clear Abdomen: soft, nontender, good BS Extremities: no cyanosis, clubbing, rash, RUE PICC. R and LLE no edema. Ted hose in  place Neuro: alert & orientedx3, cranial nerves grossly intact. moves all 4 extremities w/o difficulty. Affect pleasant  Telemetry: NSR with v pacing  Labs: Basic Metabolic Panel:  Recent Labs Lab 02/18/14 0500 02/18/14 0840 02/19/14 0806 02/20/14 1015 02/21/14 0315  NA 127*  127* 131* 126* 128* 132*  K 3.6*  3.5* 4.3 3.7 3.8 4.6  CL 89*  88* 93* 88* 88* 92*  CO2 25  25 26 24 29 27   GLUCOSE 331*  339* 101* 101* 130* 132*  BUN 33*  32* 34* 35* 32* 30*  CREATININE 1.47*  1.44* 1.47* 1.46* 1.37* 1.33  CALCIUM 8.4  8.4 9.0 9.2 9.3 9.7  PHOS 3.2 3.5 3.7  --   --     Liver Function Tests:  Recent Labs Lab 02/15/14 0508 02/16/14 0545 02/17/14 0354 02/18/14 0500 02/18/14 0840 02/19/14 0806  AST 36 30 33 36  --   --   ALT 13 14 18 19   --   --   ALKPHOS 67 63 73 80  --   --   BILITOT 1.1 0.8 0.6 0.5  --   --   PROT 6.2 6.1 6.9 6.6  --   --   ALBUMIN 3.0* 2.7* 2.8* 2.6*  2.6* 2.8* 2.7*   No results for input(s): LIPASE, AMYLASE in the last 168 hours. No results for input(s): AMMONIA in the last 168 hours.  CBC:  Recent Labs Lab 02/18/14 0500 02/18/14 0840 02/19/14 0806 02/20/14  1015 02/21/14 0315  WBC 11.7* 12.4* 13.0* 14.7* 19.8*  HGB 9.6* 9.9* 10.2* 10.7* 10.9*  HCT 27.7* 29.1* 29.8* 31.4* 31.8*  MCV 82.9 83.1 82.5 82.8 82.8  PLT 211 242 285 318 376    Cardiac Enzymes: No results for input(s): CKTOTAL, CKMB, CKMBINDEX, TROPONINI in the last 168 hours.  BNP: BNP (last 3 results)  Recent Labs  01/20/14 1414 02/10/14 1755  PROBNP 519.4* 653.7*     Other results:    Imaging: Dg Chest Port 1 View  02/21/2014   CLINICAL DATA:  Shortness of breath.  EXAM: PORTABLE CHEST - 1 VIEW  COMPARISON:  02/20/2014  FINDINGS: Right PICC line and left pacer remain in place, unchanged. Cardiomegaly. Elevation of the right hemidiaphragm with right basilar atelectasis. Mild vascular congestion without overt edema. No acute bony abnormality. Postoperative  changes on the right.  IMPRESSION: Elevation of the right hemidiaphragm with postoperative changes and right base atelectasis. No pneumothorax.  Cardiomegaly, vascular congestion   Electronically Signed   By: Rolm Baptise M.D.   On: 02/21/2014 09:22   Dg Chest Port 1 View  02/20/2014   CLINICAL DATA:  Shortness of breath  EXAM: PORTABLE CHEST - 1 VIEW  COMPARISON:  02/19/2014  FINDINGS: Unchanged positioning of dual-chamber ICD/ pacer leads. Stable positioning of right IJ catheter. A right-sided thoracic drain has been removed. Chronic cardiomegaly and upper mediastinal widening. No evidence of pneumothorax or increasing pleural fluid. Atelectasis on the right is unchanged.  IMPRESSION: 1. No pneumothorax after thoracic drain removal. 2. Unchanged subsegmental atelectasis on the right.   Electronically Signed   By: Jorje Guild M.D.   On: 02/20/2014 06:17     Medications:     Scheduled Medications: . allopurinol  500 mg Oral Daily  . antiseptic oral rinse  7 mL Mouth Rinse BID  . atorvastatin  40 mg Oral q1800  . bisacodyl  10 mg Oral Daily  . carvedilol  3.125 mg Oral BID WC  . cefTAZidime (FORTAZ)  IV  1 g Intravenous 3 times per day  . colchicine  0.6 mg Oral BID  . digoxin  0.125 mg Oral Q48H  . enoxaparin (LOVENOX) injection  40 mg Subcutaneous Q24H  . insulin aspart  0-24 Units Subcutaneous 4 times per day  . levothyroxine  150 mcg Oral QAC breakfast  . magnesium oxide  400 mg Oral BID  . pantoprazole  40 mg Oral Daily  . senna-docusate  1 tablet Oral QHS  . tobramycin-dexamethasone  2 drop Right Eye BID    Infusions: . dextrose 5 % and 0.9% NaCl 10 mL/hr at 02/20/14 2000    PRN Medications: fentaNYL, HYDROmorphone, ondansetron (ZOFRAN) IV   Assessment:   1. Acute on Chronic systolic HF due to NICM  --with episode of low output HF requiring milrinone support this summer 2. 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 Acute on chronic renal failure,  stage III 4. Hypomagenesemia 5. Polycythemia 6. Acute gout  Plan/Discussion:    He looks great. CVP down Renal function stable off milrinone. Will restart home diuretics tomorrow. Restart losartan today. Can got to 2W. Should be ready for d/c from our standpoint tomorrow on previous home regimen.   We will see as needed.  Daniel Bensimhon,MD 11:01 AM

## 2014-02-21 NOTE — Discharge Summary (Signed)
Physician Discharge Summary       [address]       [place] [address]             [phone]    Patient ID: [name] MRN: [id number] DOB/AGE: Sep 05, 1960 53 y.o.  Admit date: 02/10/2014 Discharge date: 02/23/2014  Admission Diagnoses: 1. Enlarging paraganglioma of the right mediastinum 2. Acute renal failure superimposed on stage 3 chronic kidney disease 3. History of cardiomyopathy 4. History of OSA (on CPAP) 5. History of gout 6. History of VT (s/p AICD) 7. History of hypertension 8. History of hypothyroidism 9. History of bladder tumor  Discharge Diagnoses:  1. Enlarging paraganglioma of the right mediastinum 2. Acute renal failure superimposed on stage 3 chronic kidney disease 3. History of cardiomyopathy 4. History of OSA (on CPAP) 5. History of gout 6. History of VT (s/p AICD) 7. History of hypertension 8. History of hypothyroidism 9. History of bladder tumor  Consults: Heart failure (Dr. Haroldine Laws) and nephrology (Dr. Joelyn Oms)  Procedure (s):  1. Right VATS (video-assisted thoracoscopic surgery). 2 Right thoracotomy, resection of 9-cm paraganglioma from the central-superior mediastinum by Dr. Prescott Gum on 02/13/2014.  Pathology: 1. Mediastinum, mass resection - PARAGANGLIOMA (7.5 CM). SEE COMMENT. - SURGICAL MARGIN, NEGATIVE FOR TUMOR 2. Lymph node, biopsy, 4 R #1 - ONE LYMPH NODE, NEGATIVE FOR TUMOR (0/1). 3. Lymph node, biopsy, 4 R #2 - ONE LYMPH NODE, NEGATIVE FOR TUMOR (0/1). 4. Mediastinum, biopsy, Anterior aspect of mediastinal mass - BENIGN FIBROVASCULAR AND ADIPOSE SOFT TISSUE WITH SCAR, MINIMAL CHRONIC INFLAMMATION AND FOREIGN BODY GIANT CELL REACTION. - NEGATIVE FOR TUMOR.  History of Presenting Illness: This is a 53 year old male who was found to have a paraganglioma/neuroendocrine tumor of the right chest. He had a mediastinal biopsy in September of this year which confirmed the neuroendocrine tumor. The tumor  has gradually increased in size over the past 8 years and is now causing symptoms with swallowing difficulty. The surgery has been scheduled for December 10th at [facility]. The patient is going to be preadmitted for placement of a central line by IRand milrinone because of his nonischemic cardiomyopathy (LVEF of 15%). The patient has fairly compensated heart failure, but has class IIIsymptoms ( bloating and shortness of breath). After a Chamberlainprocedure to biopsy the mass this past summer, the patient developed acute renal failure because of his poor cardiac function. Only after milrinone was initiated for several days did his kidney function returned back to baseline.  The patient is a nonsmoker. Previous PFTs had been performed. Results showed: FEV11.3, FVC 1.9, DLCO 54% He is currently not on any blood thinners. We'll plan on a preoperative epidural catheter placement by anesthesia for control of postoperative thoracotomy pain as the incision to resect the mass, which will be considerable.  The patient understands the reason to resect the mass because of extrinsic compression of the trachea and symptomatic problems with swallowing. He understands the risk of the surgery related to the actual procedure including bleeding and postoperative pleural effusion or other pulmonary problems related to his obesity and COPD. Also, he understands the risks of surgery related to his poor cardiac function including risks of cardiogenic shock and death.  The patient was recently evaluated by his renal physicians and his renal status is optimized. He was seen at the end of November in the advanced heart care clinic and his cardiac function is optimized.  He was pre admitted for placement of a tunneled  catheter by IR and initiation of a Milrinone drip.  Brief Hospital Course:  Dr. Ermalene Postin placed an epidural pre op to help with pain control. The patient remained afebrile and hemodynamically stable.  He was started on a lasix drip post op. Milrinone and co ox were followed daily by heart failure team. Chest tube output gradually decreased. There was no air leak. Anterior chest tube was removed on 12/12. Remaining chest tube was removed 12/16. Chest x ray remained stable.His creatinine did continue to increase post op (he has a history of CKD and baseline creatinine is 1.8-2). Lasix drip was stopped. Eventually, his creatinine did decrease and he was started on oral Lasix. He later developed a flair up of his gout in his right knee. He was already on Colchicine and Allopurinol as he had taken pre op. He was also given a few doses of Solu medrol IV, which helped with the pain. His epidural was removed on 12/16. He continued to have adequate pain control. He has been tolerating a diet and has had multiple bowel movements. He is ambulating on room air. He was transferred from 2S to [facility] on 02/19/2014. Milrinone drip was stopped 12/17.  Heart falure has restarted low dose Cozaar for better BP control. Also, he was restarted on Demadex. He is felt surgically stable for transfer from [facility] to 2W and this was done on 12/19. His creatinine is slowly increasing (his baseline is 1.8-2). Today's creatinine is 1.56. Dr. Meda Coffee (cardiology) discussed with Dr. Haroldine Laws. Demadex is to be stopped for 48 hours and patient is to follow up this week in the heart failure clinic. Per heart failure, ok to discharge.  Latest Vital Signs: Blood pressure 129/73, pulse 84, temperature 98.6 F (37 C), temperature source Oral, resp. rate 18, height 5\' 10"  (1.778 m), weight 242 lb 15.2 oz (110.2 kg), SpO2 100 %.  Physical Exam: Cardiovascular: RRR, no murmurs, gallops, or rubs. Pulmonary: Slightly diminished at bases; no rales, wheezes, or rhonchi. Abdomen: Soft, non tender, bowel sounds present. Extremities: Ted hose in place Wounds: Clean and dry. No erythema or signs of infection.  Discharge Condition:Stable  Recent laboratory  studies:  Lab Results  Component Value Date   WBC 16.5* 02/23/2014   HGB 11.3* 02/23/2014   HCT 33.8* 02/23/2014   MCV 85.4 02/23/2014   PLT 497* 02/23/2014   Lab Results  Component Value Date   NA 135* 02/23/2014   K 4.2 02/23/2014   CL 95* 02/23/2014   CO2 27 02/23/2014   CREATININE 1.56* 02/23/2014   GLUCOSE 94 02/23/2014      Diagnostic Studies:Dg Chest Port 1 View  02/21/2014   CLINICAL DATA:  Shortness of breath.  EXAM: PORTABLE CHEST - 1 VIEW  COMPARISON:  02/20/2014  FINDINGS: Right PICC line and left pacer remain in place, unchanged. Cardiomegaly. Elevation of the right hemidiaphragm with right basilar atelectasis. Mild vascular congestion without overt edema. No acute bony abnormality. Postoperative changes on the right.  IMPRESSION: Elevation of the right hemidiaphragm with postoperative changes and right base atelectasis. No pneumothorax.  Cardiomegaly, vascular congestion   Electronically Signed   By: Rolm Baptise M.D.   On: 02/21/2014 09:22    Discharge Medications:   Medication List    TAKE these medications        atorvastatin 40 MG tablet  Commonly known as:  LIPITOR  Take 1 tablet (40 mg total) by mouth at bedtime.     carvedilol 3.125 MG tablet  Commonly known as:  COREG  Take 1 tablet (3.125 mg total) by mouth 2 (two) times daily with a meal.     colchicine 0.6 MG tablet  Take 0.6 mg by mouth daily as needed (gout).     digoxin 0.25 MG tablet  Commonly known as:  LANOXIN  Take 0.125 mg by mouth daily.     HYDROmorphone 2 MG tablet  Commonly known as:  DILAUDID  Take 1 tablet (2 mg total) by mouth every 4 (four) hours as needed for moderate pain or severe pain.     levothyroxine 150 MCG tablet  Commonly known as:  SYNTHROID, LEVOTHROID  Take 150 mcg by mouth daily before breakfast.     losartan 25 MG tablet  Commonly known as:  COZAAR  Take 1 tablet (25 mg total) by mouth daily.     magnesium oxide 400 MG tablet  Commonly known as:  MAG-OX   Take 400 mg by mouth 2 (two) times daily.     OVER THE COUNTER MEDICATION  Place 1 drop into both eyes daily as needed (red eyes). Eye drops     tobramycin-dexamethasone ophthalmic solution  Commonly known as:  TOBRADEX  Place 2 drops into the right eye 2 (two) times daily.     torsemide 20 MG tablet  Commonly known as:  DEMADEX  Take 1 tablet (20 mg total) by mouth daily.  Start taking on:  02/25/2014     ULORIC 80 MG Tabs  Generic drug:  Febuxostat  Take 80 mg by mouth at bedtime.        Follow Up Appointments: Follow-up Information    Follow up with VAN Wilber Oliphant, MD On 03/12/2014.   Specialty:  Cardiothoracic Surgery   Why:  PA/LAT CXR to be taken (at [facility] which is in the same building as Dr. Lucianne Lei Trigt's office) on 03/12/2014 at 2:30 pm;Appointment with Dr. Prescott Gum is at 3:30 pm   Contact information:   [place] [address] [phone]       Follow up with Glori Bickers, MD.   Specialty:  Cardiology   Why:  Call for a ollow up appointment for this week (week of December 21st)   Contact information:   [address] [place] [address] [phone]       Follow up with Donetta Potts, MD.   Specialty:  Nephrology   Why:  Call for a follow up appointment   Contact information:   [place] [address] [phone]       Follow up with Len Childs, MD On 02/26/2014.   Specialty:  Cardiothoracic Surgery   Why:  Appointment is with NURSE to have right posterior staples remvoed. Appointment time is at 10:00 am   Contact information:   [address] [address] [phone]       Signed: Lars Pinks MPA-C 02/23/2014, 11:02 AM

## 2014-02-22 ENCOUNTER — Inpatient Hospital Stay (HOSPITAL_COMMUNITY): Payer: 59

## 2014-02-22 DIAGNOSIS — Z9689 Presence of other specified functional implants: Secondary | ICD-10-CM | POA: Insufficient documentation

## 2014-02-22 DIAGNOSIS — J939 Pneumothorax, unspecified: Secondary | ICD-10-CM | POA: Insufficient documentation

## 2014-02-22 DIAGNOSIS — Z789 Other specified health status: Secondary | ICD-10-CM

## 2014-02-22 LAB — CBC
HCT: 32 % — ABNORMAL LOW (ref 39.0–52.0)
Hemoglobin: 10.8 g/dL — ABNORMAL LOW (ref 13.0–17.0)
MCH: 28.2 pg (ref 26.0–34.0)
MCHC: 33.8 g/dL (ref 30.0–36.0)
MCV: 83.6 fL (ref 78.0–100.0)
PLATELETS: 410 10*3/uL — AB (ref 150–400)
RBC: 3.83 MIL/uL — AB (ref 4.22–5.81)
RDW: 14.3 % (ref 11.5–15.5)
WBC: 21.4 10*3/uL — ABNORMAL HIGH (ref 4.0–10.5)

## 2014-02-22 LAB — BASIC METABOLIC PANEL
Anion gap: 12 (ref 5–15)
BUN: 36 mg/dL — ABNORMAL HIGH (ref 6–23)
CALCIUM: 9.7 mg/dL (ref 8.4–10.5)
CO2: 27 mEq/L (ref 19–32)
Chloride: 92 mEq/L — ABNORMAL LOW (ref 96–112)
Creatinine, Ser: 1.42 mg/dL — ABNORMAL HIGH (ref 0.50–1.35)
GFR calc non Af Amer: 55 mL/min — ABNORMAL LOW (ref >90)
GFR, EST AFRICAN AMERICAN: 63 mL/min — AB (ref >90)
Glucose, Bld: 135 mg/dL — ABNORMAL HIGH (ref 70–99)
POTASSIUM: 4.6 meq/L (ref 3.7–5.3)
SODIUM: 131 meq/L — AB (ref 137–147)

## 2014-02-22 LAB — GLUCOSE, CAPILLARY
Glucose-Capillary: 147 mg/dL — ABNORMAL HIGH (ref 70–99)
Glucose-Capillary: 125 mg/dL — ABNORMAL HIGH (ref 70–99)
Glucose-Capillary: 98 mg/dL (ref 70–99)

## 2014-02-22 NOTE — Progress Notes (Signed)
RT assisted pt. With plugging up his home cpap unit and putting sterile water into the water chamber. Pt. Able to operate his cpap on his own.

## 2014-02-22 NOTE — Progress Notes (Addendum)
      [address]       [place] [address]             [phone]       9 Days Post-Op Procedure(s) (LRB): VIDEO ASSISTED THORACOSCOPY (VATS)/THOROCOTOMY (Right) RESECTION OF MEDIASTINAL MASS (Right)  Subjective: Patient sitting in chair and has no complaints.  Objective: Vital signs in last 24 hours: Temp:  [97.7 F (36.5 C)-97.9 F (36.6 C)] 97.9 F (36.6 C) (12/19 0300) Pulse Rate:  [76-100] 82 (12/19 0600) Cardiac Rhythm:  [-] Ventricular paced (12/19 0815) Resp:  [15-20] 15 (12/19 0400) BP: (131-145)/(70-91) 145/80 mmHg (12/19 1016) SpO2:  [95 %-98 %] 98 % (12/19 0600) Weight:  [244 lb 8 oz (110.904 kg)] 244 lb 8 oz (110.904 kg) (12/19 0300)   Hemodynamic parameters for last 24 hours: CVP:  [1 mmHg-7 mmHg] 3 mmHg  Intake/Output from previous day: 12/18 0701 - 12/19 0700 In: 870 [P.O.:480; I.V.:240; IV Piggyback:150] Out: 650 [Urine:650]   Physical Exam:  Cardiovascular: RRR, no murmurs, gallops, or rubs. Pulmonary: Slightly diminished at bases; no rales, wheezes, or rhonchi. Abdomen: Soft, non tender, bowel sounds present. Extremities: Ted hose in place Wounds: Clean and dry.  No erythema or signs of infection.   Lab Results: CBC:  Recent Labs  02/21/14 0315 02/22/14 0230  WBC 19.8* 21.4*  HGB 10.9* 10.8*  HCT 31.8* 32.0*  PLT 376 410*   BMET:   Recent Labs  02/21/14 0315 02/22/14 0230  NA 132* 131*  K 4.6 4.6  CL 92* 92*  CO2 27 27  GLUCOSE 132* 135*  BUN 30* 36*  CREATININE 1.33 1.42*  CALCIUM 9.7 9.7    PT/INR: No results for input(s): LABPROT, INR in the last 72 hours. ABG:  INR: Will add last result for INR, ABG once components are confirmed Will add last 4 CBG results once components are confirmed  Assessment/Plan:  1. CV - CHF due to non ischemic cardiomyopathy (LVEF 15%), history of CRT-D implantation. Milrinone drip stopped yesterday. On Coreg 3.125 bid, Digoxin 0.125 daily, Cozaar 25 daily. 2.   Pulmonary - CXR shows no pneumothorax, persistent elevation right hemidiaphragm, and atelectasis on right. On Fortaz. Will not need antibiotic at discharge.Encourage incentive spirometer 3. Gout-continue allopurinol and colchicine. Was previously given IV Solu medrol twice. On Uloric prior to surgery, which will restart at discharge. 4. Volume overload- Demadex per heart failure team 5. Anemia-H and H yesterday stable at 10.8 and 32 6. Acute on chronic renal failure (stage III)Creatinine slightly increased from 1.33 to 1.42. Low dose Cozaar started yesterday. 7. Ok to transfer to 2000 8. Remove every other staple in am 9. Hopefully, home in am  ZIMMERMAN,DONIELLE MPA-C 02/22/2014,11:04 AM  Patient seen and examined Agree with above Home tomorrow if renal function OK

## 2014-02-23 LAB — BASIC METABOLIC PANEL
Anion gap: 13 (ref 5–15)
BUN: 42 mg/dL — AB (ref 6–23)
CHLORIDE: 95 meq/L — AB (ref 96–112)
CO2: 27 meq/L (ref 19–32)
Calcium: 9.5 mg/dL (ref 8.4–10.5)
Creatinine, Ser: 1.56 mg/dL — ABNORMAL HIGH (ref 0.50–1.35)
GFR calc Af Amer: 56 mL/min — ABNORMAL LOW (ref >90)
GFR calc non Af Amer: 49 mL/min — ABNORMAL LOW (ref >90)
GLUCOSE: 94 mg/dL (ref 70–99)
Potassium: 4.2 mEq/L (ref 3.7–5.3)
SODIUM: 135 meq/L — AB (ref 137–147)

## 2014-02-23 LAB — CBC
HEMATOCRIT: 33.8 % — AB (ref 39.0–52.0)
Hemoglobin: 11.3 g/dL — ABNORMAL LOW (ref 13.0–17.0)
MCH: 28.5 pg (ref 26.0–34.0)
MCHC: 33.4 g/dL (ref 30.0–36.0)
MCV: 85.4 fL (ref 78.0–100.0)
PLATELETS: 497 10*3/uL — AB (ref 150–400)
RBC: 3.96 MIL/uL — AB (ref 4.22–5.81)
RDW: 14.7 % (ref 11.5–15.5)
WBC: 16.5 10*3/uL — AB (ref 4.0–10.5)

## 2014-02-23 MED ORDER — TORSEMIDE 20 MG PO TABS: 20.0000 mg | ORAL_TABLET | Freq: Every day | ORAL | Status: DC

## 2014-02-23 MED ORDER — SODIUM CHLORIDE 0.9 % IJ SOLN
10.0000 mL | INTRAMUSCULAR | Status: DC | PRN
  Administered 2014-02-23: 10 mL
  Filled 2014-02-23: qty 40

## 2014-02-23 MED ORDER — HYDROMORPHONE HCL 2 MG PO TABS: 2.0000 mg | ORAL_TABLET | ORAL | Status: DC | PRN

## 2014-02-23 MED ORDER — CARVEDILOL 3.125 MG PO TABS: 3.1250 mg | ORAL_TABLET | Freq: Two times a day (BID) | ORAL | Status: DC

## 2014-02-23 NOTE — Progress Notes (Signed)
Physical Therapy Treatment Patient Details Name: [name] MRN: [id number] DOB: 01/30/1961 Today's Date: 02/23/2014    History of Present Illness 53 y.o. male s/p VATS procedure with removal of mediasintal mass on Rt. PMHx of non ischemic cardiomyopathy (LVEF 26%), chronic systolic CHF, CKD, and paroxysmal VT s/p AICD. Gout flare right knee    PT Comments    Pt presents with minimal pain complaints, improved mobility independence and readiness to go home from PT perspective.  Goals met and plan to d/c PT acutely.  May benefit from HHPT to assist with improving cardiorespiratory function and ability to sustain ADL, however pt essentially performing safely without physical assistance.  No other acute PT needs.  Thanks for allowing [place] to participate in this patient's care.   Follow Up Recommendations        Equipment Recommendations       Recommendations for Other Services       Precautions / Restrictions Restrictions Weight Bearing Restrictions: No    Mobility  Bed Mobility Overal bed mobility:  (not observed, up in room and back to chair)             General bed mobility comments: pt on commode upon arrival  Transfers Overall transfer level: Modified independent Equipment used:  (pt pushed IV pole) Transfers: Sit to/from Stand Sit to Stand: Modified independent (Device/Increase time)         General transfer comment: controlled descent observed into recliner.  up off commode unobserved with no reported issues  Ambulation/Gait Ambulation/Gait assistance: Supervision Ambulation Distance (Feet): 200 Feet Assistive device:  (pushed iv pole) Gait Pattern/deviations: Antalgic;Trunk flexed Gait velocity: decr Gait velocity interpretation: Below normal speed for age/gender General Gait Details: used IV pole for support but minimally dependent upon.  flexed to accommodate pole legs.  slow but relatively stead and symmetrical gait with minimal favoring of right  knee.  Fatigued noted by mild incr wob and pt reports 'tired' -- instructed on multiple short bouts of activity to rebuild baseline endurance   Stairs Stairs: Yes Stairs assistance: Supervision Stair Management: Two rails;Forwards;Alternating pattern Number of Stairs: 3 General stair comments: limited by central line, but good ability to asc/desc stairs with no apparent difficutly other than tired quickly.  no concerns by pt to access home and only 2 STE via garage  Wheelchair Mobility    Modified Rankin (Stroke Patients Only)       Balance Overall balance assessment: No apparent balance deficits (not formally assessed)                                  Cognition Arousal/Alertness: Awake/alert Behavior During Therapy: WFL for tasks assessed/performed Overall Cognitive Status: Within Functional Limits for tasks assessed                      Exercises Other Exercises Other Exercises: discussed progressive incr in sustained activity, self-monitoring strategies and use of ADL to gage improvemtn    General Comments General comments (skin integrity, edema, etc.): incision noted at right trunk to be clean and dry      Pertinent Vitals/Pain Pain Assessment: 0-10 Pain Score: 2  Pain Location: right knee Pain Descriptors / Indicators: Sore Pain Intervention(s): Limited activity within patient's tolerance;Monitored during session (pt has not needed pain med to [facility]  pain)    Home Living  Prior Function            PT Goals (current goals can now be found in the care plan section) Acute Rehab PT Goals Patient Stated Goal: Go home Progress towards PT goals: Goals met/education completed, patient discharged from PT    Frequency  Min 3X/week    PT Plan Current plan remains appropriate    Co-evaluation             End of Session   Activity Tolerance: Patient tolerated treatment well;Patient limited by  fatigue Patient left: in chair;with call bell/phone within reach     Time: 0910-0934 PT Time Calculation (min) (ACUTE ONLY): 24 min  Charges:  $Gait Training: 8-22 mins $Therapeutic Activity: 8-22 mins                    G Codes:      Herbie Drape 02/23/2014, 9:42 AM

## 2014-02-23 NOTE — Progress Notes (Signed)
Consulting cardiologist: Glori Bickers MD Primary Cardiologist: Glori Bickers MD   Cardiology Specific Problem List: 1.Chronic Systolic HF due NICM 2. Large Mediastinal Neuroendocrine Tumor S/P VATS  Subjective:   Feeling well. Hopes to go home.   Objective:   Temp:  [97.4 F (36.3 C)-98.6 F (37 C)] 98.6 F (37 C) (12/20 0519) Pulse Rate:  [84-87] 84 (12/20 0519) Resp:  [18] 18 (12/20 0519) BP: (124-145)/(73-88) 129/73 mmHg (12/20 0519) SpO2:  [97 %-100 %] 100 % (12/20 0519) Weight:  [242 lb 15.2 oz (110.2 kg)] 242 lb 15.2 oz (110.2 kg) (12/20 0519) Last BM Date: 02/23/14 (per pt report)  Filed Weights   02/21/14 0300 02/22/14 0300 02/23/14 0519  Weight: 245 lb 6.4 oz (111.313 kg) 244 lb 8 oz (110.904 kg) 242 lb 15.2 oz (110.2 kg)    Intake/Output Summary (Last 24 hours) at 02/23/14 0800 Last data filed at 02/23/14 0998  Gross per 24 hour  Intake    720 ml  Output   1000 ml  Net   -280 ml    Telemetry: NSR  Exam:  General: No acute distress.  HEENT: Conjunctiva and lids normal, oropharynx clear.  Lungs: Clear to auscultation, nonlabored. Staples to the right upper chest intact. Dressing to the right lateral chest.   Cardiac: No elevated JVP or bruits. RRR, no gallop or rub.   Abdomen: Normoactive bowel sounds, nontender, nondistended.  Extremities: No pitting edema, distal pulses full.  Neuropsychiatric: Alert and oriented x3, affect appropriate.   Lab Results:  Basic Metabolic Panel:  Recent Labs Lab 02/21/14 0315 02/22/14 0230 02/23/14 0522  NA 132* 131* 135*  K 4.6 4.6 4.2  CL 92* 92* 95*  CO2 27 27 27   GLUCOSE 132* 135* 94  BUN 30* 36* 42*  CREATININE 1.33 1.42* 1.56*  CALCIUM 9.7 9.7 9.5    Liver Function Tests:  Recent Labs Lab 02/17/14 0354 02/18/14 0500 02/18/14 0840 02/19/14 0806  AST 33 36  --   --   ALT 18 19  --   --   ALKPHOS 73 80  --   --   BILITOT 0.6 0.5  --   --   PROT 6.9 6.6  --   --   ALBUMIN  2.8* 2.6*  2.6* 2.8* 2.7*    CBC:  Recent Labs Lab 02/21/14 0315 02/22/14 0230 02/23/14 0522  WBC 19.8* 21.4* 16.5*  HGB 10.9* 10.8* 11.3*  HCT 31.8* 32.0* 33.8*  MCV 82.8 83.6 85.4  PLT 376 410* 497*   BNP:  Recent Labs  01/20/14 1414 02/10/14 1755  PROBNP 519.4* 653.7*     Medications:   Scheduled Medications: . allopurinol  500 mg Oral Daily  . alteplase  2 mg Intracatheter Once  . antiseptic oral rinse  7 mL Mouth Rinse BID  . atorvastatin  40 mg Oral q1800  . bisacodyl  10 mg Oral Daily  . carvedilol  3.125 mg Oral BID WC  . cefTAZidime (FORTAZ)  IV  1 g Intravenous 3 times per day  . colchicine  0.6 mg Oral BID  . digoxin  0.125 mg Oral Q48H  . enoxaparin (LOVENOX) injection  40 mg Subcutaneous Q24H  . levothyroxine  150 mcg Oral QAC breakfast  . losartan  25 mg Oral Daily  . magnesium oxide  400 mg Oral BID  . pantoprazole  40 mg Oral Daily  . senna-docusate  1 tablet Oral QHS  . sodium chloride  10-40 mL Intracatheter Q12H  .  tobramycin-dexamethasone  2 drop Right Eye BID  . torsemide  10 mg Oral Once per day on Sun Tue Thu Sat  . [START ON 02/24/2014] torsemide  20 mg Oral Once per day on Mon Wed Fri    Infusions: . dextrose 5 % and 0.9% NaCl 10 mL/hr at 02/21/14 2000    PRN Medications: fentaNYL, HYDROmorphone, ondansetron (ZOFRAN) IV, sodium chloride, sodium chloride   Assessment and Plan:   1. Chronic Systolic CHF due to NICM: Well compensated currently. Renal status is worse this am rising from 1.42 yesterday to 1.56 this am. Low output cardio-renal likely. Remains on milrinone 1.126 mcg. Continues on torsemide  Follow up appt with CHF clinic is arranged if plans to d/c today.  2. Acute on Chronic Renal failure Stage III  3. Large Mediastinal Neuroendocrine Tumor, S/P VATS   Phill Myron. Lawrence NP [facility]  02/23/2014, 8:00 AM   The patient was seen, examined and discussed with Jory [name], NP and I agree with the above.   The  patient looks well, denies SOB, weaned off Milrinone on Friday, Crea has slightly increased 1.33.--> 1.4--1.56, still within his baseline range, his weight has decreased 3 lbs in the last 2 days. We will hold Demadex today and tomorrow, restart on Tuesday. We will arrange for an early follow up with CHF clinic.   Dorothy Spark 02/23/2014

## 2014-02-23 NOTE — Progress Notes (Addendum)
      [address]       [place] [address]             [phone]       10 Days Post-Op Procedure(s) (LRB): VIDEO ASSISTED THORACOSCOPY (VATS)/THOROCOTOMY (Right) RESECTION OF MEDIASTINAL MASS (Right)  Subjective: Patient sitting in chair and has no complaints. He wants to go home.  Objective: Vital signs in last 24 hours: Temp:  [97.4 F (36.3 C)-98.6 F (37 C)] 98.6 F (37 C) (12/20 0519) Pulse Rate:  [84-87] 84 (12/20 0519) Cardiac Rhythm:  [-] Ventricular paced (12/19 1948) Resp:  [18] 18 (12/20 0519) BP: (124-145)/(73-88) 129/73 mmHg (12/20 0519) SpO2:  [97 %-100 %] 100 % (12/20 0519) Weight:  [242 lb 15.2 oz (110.2 kg)] 242 lb 15.2 oz (110.2 kg) (12/20 0519)     Intake/Output from previous day: 12/19 0701 - 12/20 0700 In: 720 [P.O.:720] Out: 1000 [Urine:1000]   Physical Exam:  Cardiovascular: RRR, no murmurs, gallops, or rubs. Pulmonary: Slightly diminished at bases; no rales, wheezes, or rhonchi. Abdomen: Soft, non tender, bowel sounds present. Extremities: Ted hose in place Wounds: Clean and dry.  No erythema or signs of infection.   Lab Results: CBC:  Recent Labs  02/22/14 0230 02/23/14 0522  WBC 21.4* 16.5*  HGB 10.8* 11.3*  HCT 32.0* 33.8*  PLT 410* 497*   BMET:   Recent Labs  02/22/14 0230 02/23/14 0522  NA 131* 135*  K 4.6 4.2  CL 92* 95*  CO2 27 27  GLUCOSE 135* 94  BUN 36* 42*  CREATININE 1.42* 1.56*  CALCIUM 9.7 9.5    PT/INR: No results for input(s): LABPROT, INR in the last 72 hours. ABG:  INR: Will add last result for INR, ABG once components are confirmed Will add last 4 CBG results once components are confirmed  Assessment/Plan:  1. 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 2.  Pulmonary -  On Fortaz. Will stop.Encourage incentive spirometer 3. Gout-on allopurinol and colchicine. Was previously  given IV Solu medrol twice. On Uloric prior to surgery, which will restart at discharge and continue colchicine PRN. 4. Volume overload- Demadex per heart failure team 5. Anemia-H and H  stable at 11.3 and 33.8 6. Acute on chronic renal failure (stage III)Creatinine slightly increased from 1.42 to 1.56. On low dose Cozaar and Demadex. Per Dr. Haroldine Laws, stop Demadex for 2 days 7. Per Dr. Haroldine Laws, ok to discharge and needs to follow up in heart failure clinic this week.  ZIMMERMAN,DONIELLE MPA-C 02/23/2014,8:05 AM

## 2014-02-26 ENCOUNTER — Ambulatory Visit (INDEPENDENT_AMBULATORY_CARE_PROVIDER_SITE_OTHER): Payer: Self-pay

## 2014-02-26 DIAGNOSIS — R222 Localized swelling, mass and lump, trunk: Secondary | ICD-10-CM

## 2014-02-26 DIAGNOSIS — Z4802 Encounter for removal of sutures: Secondary | ICD-10-CM

## 2014-02-26 NOTE — Progress Notes (Signed)
Removed 4 sutures from chest tube sites. Removed 15 staples from Right thoracotomy site. No signs of infection and patient tolerated well.

## 2014-03-03 ENCOUNTER — Telehealth: Payer: Self-pay | Admitting: Pulmonary Disease

## 2014-03-03 DIAGNOSIS — G4733 Obstructive sleep apnea (adult) (pediatric): Secondary | ICD-10-CM

## 2014-03-03 NOTE — Telephone Encounter (Signed)
Per the 11.6.15 ov w/ KC: Patient Instructions       Your download shows that your sleep apnea is well controlled.  Keep up with mask changes and supplies. Keep working on weight loss Will send an order to your homecare company to show you different types of full face masks.   followup with me again in one year if doing well.     LMOM TCB x1.

## 2014-03-04 NOTE — Telephone Encounter (Signed)
Order placed.  Pt aware.  Nothing further needed.  

## 2014-03-04 NOTE — Telephone Encounter (Signed)
Order DME change CPAP to [employer] 5-20 cwp     Dx OSA

## 2014-03-04 NOTE — Telephone Encounter (Signed)
Pt states that he has spoken with [facility] and they have helped him change his humidity level on machine. Pt states that he is needing to have his CPAP pressure adjusted d/t waking multiple times during the night gasping for air. Pt would like to go back to Auto setting rather than being at a fixed pressure. Please advise Dr Annamaria Boots as Dr Gwenette Greet is out of office and then patient requests this change be done asap. Thanks.

## 2014-03-10 ENCOUNTER — Other Ambulatory Visit: Payer: Self-pay | Admitting: Cardiothoracic Surgery

## 2014-03-10 DIAGNOSIS — D447 Neoplasm of uncertain behavior of aortic body and other paraganglia: Secondary | ICD-10-CM

## 2014-03-12 ENCOUNTER — Encounter: Payer: Self-pay | Admitting: Cardiothoracic Surgery

## 2014-03-12 ENCOUNTER — Ambulatory Visit
Admission: RE | Admit: 2014-03-12 | Discharge: 2014-03-12 | Disposition: A | Discharge status: Home / Self Care | Payer: 59 | Source: Ambulatory Visit | Attending: Cardiothoracic Surgery | Admitting: Cardiothoracic Surgery

## 2014-03-12 ENCOUNTER — Ambulatory Visit (INDEPENDENT_AMBULATORY_CARE_PROVIDER_SITE_OTHER): Payer: Self-pay | Admitting: Cardiothoracic Surgery

## 2014-03-12 VITALS — BP 130/85 | HR 100 | Resp 20 | Ht 70.0 in | Wt 236.0 lb

## 2014-03-12 DIAGNOSIS — Z9889 Other specified postprocedural states: Secondary | ICD-10-CM

## 2014-03-12 DIAGNOSIS — D447 Neoplasm of uncertain behavior of aortic body and other paraganglia: Secondary | ICD-10-CM

## 2014-03-12 NOTE — Progress Notes (Signed)
PCP is Gennette Pac, MD Referring Provider is Collene Gobble, MD  Chief Complaint  Patient presents with  . 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 Despite his low ejection fraction is done well without exacerbation of his chronic heart failure. Chronic renal failure has been stable. He has lost 20 pounds since surgery. He is having some postthoracotomy pain but no shortness of breath. He has had some episodes of dry paroxysmal coughing probably from dissection of the trachea. He is ready to return to work part time and was given a written release. He is still taking hydromorphone 1 by mouth q. Evening for pain   Past Medical History  Diagnosis Date  . Gout 08/03/12    PT C/O OF RIGHT KNEE PAIN AND SWELLING -STATES GOUT FLARE UP - ONGOING SINCE APRIL - BUT SWELLING W/IN LAST WEEK  . Cardiomyopathy     Idiopathic dilated;   Marland Kitchen Ventricular tachycardia     s/p ICD  . Sleep apnea     CPAP  . Systolic CHF     EF 67%  . CKD (chronic kidney disease)     stage III baseline Crt 1.8-2.0  . Biventricular ICD (implantable cardiac defibrillator) Medtronic ]     DOI 2008/ upgrade 2010/ Gen Change 2014  . Automatic implantable cardioverter-defibrillator in situ   . CHF (congestive heart failure)   . Bladder tumor     PT HOSP AT [facility] 4/24 TO 4/26 2014 WITH UTI--AND FOUND TO HAVE BLADDER TUMOR-  . Arthritis     GOUT  . Hilar mass     Noted CT 2010  . HTN (hypertension)     Dr. Caryl Comes cardiologist  . Coronary artery disease   . Hypothyroidism   . 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 Cholecystectomy    . Cardiac catheterization      he was found to have normal coronary arteries but with a globally dilated and hypocontractile heart  . US echocardiography  03-17-2008, 07-03-2006    EF 15-20%, EF 15-20%  . Transurethral resection of bladder tumor N/A 08/13/2012    Procedure: TRANSURETHRAL RESECTION OF BLADDER TUMOR (TURBT);  Surgeon: Claybon Jabs, MD;  Location: [facility];  Service: Urology;  Laterality: N/A;  MITOMYCIN C  . Cardiac defibrillator placement      DOI 2008/ upgrade 2010/ Gen Change 2014   . Endobronchial ultrasound Bilateral 10/01/2012    Procedure: ENDOBRONCHIAL ULTRASOUND;  Surgeon: Collene Gobble, MD;  Location: [facility];  Service: Cardiopulmonary;  Laterality: Bilateral;  . Cornea replacement    . Video bronchoscopy N/A 10/25/2012    Procedure: VIDEO BRONCHOSCOPY;  Surgeon: [name], MD;  Location: [place];  Service: Thoracic;  Laterality: N/A;  . Mediastinoscopy N/A 10/25/2012    Procedure: MEDIASTINOSCOPY;  Surgeon: [name], MD;  Location: [place];  Service: Thoracic;  Laterality: N/A;  . Tonsillectomy    . Mediastinotomy  09/10/2013    paratracheal mass    DR Mildreth Reek  . Chest tube insertion  09/10/2013  . Mediastinotomy chamberlain mcneil Right 09/10/2013  Procedure: MEDIASTINOTOMY CHAMBERLAIN MCNEIL;  Surgeon: [name], MD;  Location: [place];  Service: Thoracic;  Laterality: Right;  . Biv icd genertaor change out      DOI 2008/ upgrade 2010/ Gen Change 2014   . Right heart catheterization N/A 01/27/2012    Procedure: RIGHT HEART CATH;  Surgeon: Jolaine Artist, MD;  Location: [facility] CATH LAB;  Service: Cardiovascular;  Laterality: N/A;  . Implantable cardioverter defibrillator (icd) generator change N/A 05/02/2012    Procedure: ICD GENERATOR CHANGE;  Surgeon: Deboraha Sprang, MD;  Location: [facility] CATH LAB;  Service: Cardiovascular;  Laterality: N/A;  . Video assisted thoracoscopy (vats)/thorocotomy Right 02/13/2014     Procedure: VIDEO ASSISTED THORACOSCOPY (VATS)/THOROCOTOMY;  Surgeon: [name], MD;  [facility];  Service: Thoracic;  Laterality: Right;  PATIENT NEEDS EPIDURAL CATHETER (DR. CHRIS MOSER AWARE PER PVT)  . Resection of mediastinal mass Right 02/13/2014    Procedure: RESECTION OF MEDIASTINAL MASS;  Surgeon: [name], MD;  [facility];  Service: Thoracic;  Laterality: Right;  PATIENT NEEDS EPIDURAL CATHETER AND A SWAN GANZ CATHETER (DR. CHRIS MOSER AWARE PER PVT)    No family history on file.  Social History History  Substance Use Topics  . Smoking status: Never Smoker   . Smokeless tobacco: Never Used  . Alcohol Use: No    Current Outpatient Prescriptions  Medication Sig Dispense Refill  . atorvastatin (LIPITOR) 40 MG tablet Take 1 tablet (40 mg total) by mouth at bedtime. 30 tablet 2  . carvedilol (COREG) 3.125 MG tablet Take 1 tablet (3.125 mg total) by mouth 2 (two) times daily with a meal. 60 tablet 1  . colchicine 0.6 MG tablet Take 0.6 mg by mouth daily as needed (gout).    Marland Kitchen digoxin (LANOXIN) 0.25 MG tablet Take 0.125 mg by mouth daily.    . Febuxostat (ULORIC) 80 MG TABS Take 80 mg by mouth at bedtime.    Marland Kitchen HYDROmorphone (DILAUDID) 2 MG tablet Take 1 tablet (2 mg total) by mouth every 4 (four) hours as needed for moderate pain or severe pain. 30 tablet 0  . levothyroxine (SYNTHROID, LEVOTHROID) 150 MCG tablet Take 150 mcg by mouth daily before breakfast.    . losartan (COZAAR) 25 MG tablet Take 1 tablet (25 mg total) by mouth daily. 30 tablet 6  . magnesium oxide (MAG-OX) 400 MG tablet Take 400 mg by mouth 2 (two) times daily.     Marland Kitchen OVER THE COUNTER MEDICATION Place 1 drop into both eyes daily as needed (red eyes). Eye drops    . tobramycin-dexamethasone (TOBRADEX) ophthalmic solution Place 2 drops into the right eye 2 (two) times daily.  5  . torsemide (DEMADEX) 20 MG tablet Take 1 tablet (20 mg total) by mouth daily. 30 tablet 3   No current  facility-administered medications for this visit.    Allergies  Allergen Reactions  . Bidil [Isosorb Dinitrate-Hydralazine]     Headaches   . Spironolactone     Hyperkalemia   . Hydralazine Hcl     Fuzzy headed    Review of Systems   Gen. Improved but still having some right shoulder and back pain from the thoracotomy incision. Appetite internal strength in exercise tolerance are improved.  BP 130/85 mmHg  Pulse 100  Resp 20  Ht 5\' 10"  (1.778 m)  Wt 236 lb (107.049 kg)  BMI 33.86 kg/m2  SpO2 96% Physical Exam  Alert and comfortable Right thoracotomy incision well-healed Breath sounds  clear and equal Heart rhythm regular  Diagnostic Tests: PA and lateral chest x-ray reviewed showing postop changes, good aeration without postop effusion  Impression: Doing well one month after resection of large para- ganglioma  Plan:return with chest x-ray four-week Refill for his pain medication provided Release 4 return to work provided Patient may resume driving and lift up to 20 pounds

## 2014-03-18 ENCOUNTER — Encounter: Payer: 59 | Admitting: *Deleted

## 2014-04-15 ENCOUNTER — Other Ambulatory Visit: Payer: Self-pay | Admitting: Cardiothoracic Surgery

## 2014-04-15 DIAGNOSIS — D447 Neoplasm of uncertain behavior of aortic body and other paraganglia: Secondary | ICD-10-CM

## 2014-04-16 ENCOUNTER — Ambulatory Visit: Payer: Self-pay | Admitting: Cardiothoracic Surgery

## 2014-04-18 ENCOUNTER — Other Ambulatory Visit: Payer: Self-pay | Admitting: Cardiothoracic Surgery

## 2014-04-18 DIAGNOSIS — D447 Neoplasm of uncertain behavior of aortic body and other paraganglia: Secondary | ICD-10-CM

## 2014-04-23 ENCOUNTER — Encounter: Payer: Self-pay | Admitting: Cardiothoracic Surgery

## 2014-04-23 ENCOUNTER — Ambulatory Visit
Admission: RE | Admit: 2014-04-23 | Discharge: 2014-04-23 | Disposition: A | Discharge status: Home / Self Care | Payer: 59 | Source: Ambulatory Visit | Attending: Cardiothoracic Surgery | Admitting: Cardiothoracic Surgery

## 2014-04-23 ENCOUNTER — Ambulatory Visit (INDEPENDENT_AMBULATORY_CARE_PROVIDER_SITE_OTHER): Payer: Self-pay | Admitting: Cardiothoracic Surgery

## 2014-04-23 VITALS — BP 128/88 | HR 96 | Resp 20 | Ht 70.0 in | Wt 240.0 lb

## 2014-04-23 DIAGNOSIS — Z9889 Other specified postprocedural states: Secondary | ICD-10-CM

## 2014-04-23 DIAGNOSIS — D447 Neoplasm of uncertain behavior of aortic body and other paraganglia: Secondary | ICD-10-CM

## 2014-04-23 NOTE — Progress Notes (Signed)
PCP is Gennette Pac, MD Referring Provider is Collene Gobble, MD  Chief Complaint  Patient presents with  . Routine Post Op    4 week f/u with CXR     HPI:the patient returns for routine postop followup after right posterior lateral thoracotomy and resection of a 12 cm para ganglioma of the superior posterior mediastinum.surgical margins were clear. The patient has idiopathic cardiomyopathy followed by the advanced heart failure service. He also has history of bladder tumor followed by urology. His postthoracotomy pain is stable. 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  The patient has had recent onset of sinus drainage severe coughing wheezing and shortness of breath. He denies fever. He denies weight change ankle edema. He still is taking his heart failure meds including Demadex. He has not seen his primary care physician for these problems   Past Medical History  Diagnosis Date  . Gout 08/03/12    PT C/O OF RIGHT KNEE PAIN AND SWELLING -STATES GOUT FLARE UP - ONGOING SINCE APRIL - BUT SWELLING W/IN LAST WEEK  . Cardiomyopathy     Idiopathic dilated;   Marland Kitchen Ventricular tachycardia     s/p ICD  . Sleep apnea     CPAP  . Systolic CHF     EF 37%  . CKD (chronic kidney disease)     stage III baseline Crt 1.8-2.0  . Biventricular ICD (implantable cardiac defibrillator) Medtronic ]     DOI 2008/ upgrade 2010/ Gen Change 2014  . Automatic implantable cardioverter-defibrillator in situ   . CHF (congestive heart failure)   . Bladder tumor     PT HOSP AT [facility] 4/24 TO 4/26 2014 WITH UTI--AND FOUND TO HAVE BLADDER TUMOR-  . Arthritis     GOUT  . Hilar mass     Noted CT 2010  . HTN (hypertension)     Dr. Caryl Comes cardiologist  . Coronary artery disease   . Hypothyroidism   . 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 Cholecystectomy    . Cardiac catheterization      he was found  to have normal coronary arteries but with a globally dilated and hypocontractile heart  . US echocardiography  03-17-2008, 07-03-2006    EF 15-20%, EF 15-20%  . Transurethral resection of bladder tumor N/A 08/13/2012    Procedure: TRANSURETHRAL RESECTION OF BLADDER TUMOR (TURBT);  Surgeon: Claybon Jabs, MD;  [facility];  Service: Urology;  Laterality: N/A;  MITOMYCIN C  . Cardiac defibrillator placement      DOI 2008/ upgrade 2010/ Gen Change 2014   . Endobronchial ultrasound Bilateral 10/01/2012    Procedure: ENDOBRONCHIAL ULTRASOUND;  Surgeon: Collene Gobble, MD;  [facility];  Service: Cardiopulmonary;  Laterality: Bilateral;  . Cornea replacement    . Video bronchoscopy N/A 10/25/2012    Procedure: VIDEO BRONCHOSCOPY;  Surgeon: [name], MD;  Location: [place];  Service: Thoracic;  Laterality: N/A;  . Mediastinoscopy N/A 10/25/2012    Procedure: MEDIASTINOSCOPY;  Surgeon: [name], MD;  Location: [place];  Service: Thoracic;  Laterality: N/A;  . Tonsillectomy    . Mediastinotomy  09/10/2013    paratracheal mass    DR Coralynn Gaona  . Chest tube insertion  09/10/2013  . Mediastinotomy chamberlain mcneil Right 09/10/2013    Procedure: MEDIASTINOTOMY CHAMBERLAIN MCNEIL;  Surgeon: [name], MD;  Location: [place] OR;  Service: Thoracic;  Laterality: Right;  . Biv icd genertaor change out      DOI 2008/ upgrade 2010/ Gen Change 2014   . Right heart catheterization N/A 01/27/2012    Procedure: RIGHT HEART CATH;  Surgeon: Jolaine Artist, MD;  Location: [facility] CATH LAB;  Service: Cardiovascular;  Laterality: N/A;  . Implantable cardioverter defibrillator (icd) generator change N/A 05/02/2012    Procedure: ICD GENERATOR CHANGE;  Surgeon: Deboraha Sprang, MD;  Location: [facility] CATH LAB;  Service: Cardiovascular;  Laterality: N/A;  . Video assisted thoracoscopy (vats)/thorocotomy Right 02/13/2014    Procedure: VIDEO ASSISTED THORACOSCOPY (VATS)/THOROCOTOMY;  Surgeon: [name], MD;  [facility];  Service: Thoracic;  Laterality: Right;  PATIENT NEEDS EPIDURAL CATHETER (DR. CHRIS MOSER AWARE PER PVT)  . Resection of mediastinal mass Right 02/13/2014    Procedure: RESECTION OF MEDIASTINAL MASS;  Surgeon: [name], MD;  [facility];  Service: Thoracic;  Laterality: Right;  PATIENT NEEDS EPIDURAL CATHETER AND A SWAN GANZ CATHETER (DR. CHRIS MOSER AWARE PER PVT)    No family history on file.  Social History History  Substance Use Topics  . Smoking status: Never Smoker   . Smokeless tobacco: Never Used  . Alcohol Use: No    Current Outpatient Prescriptions  Medication Sig Dispense Refill  . atorvastatin (LIPITOR) 40 MG tablet Take 1 tablet (40 mg total) by mouth at bedtime. 30 tablet 2  . carvedilol (COREG) 3.125 MG tablet Take 1 tablet (3.125 mg total) by mouth 2 (two) times daily with a meal. 60 tablet 1  . colchicine 0.6 MG tablet Take 0.6 mg by mouth daily as needed (gout).    Marland Kitchen digoxin (LANOXIN) 0.25 MG tablet Take 0.125 mg by mouth daily.    . Febuxostat (ULORIC) 80 MG TABS Take 80 mg by mouth at bedtime.    Marland Kitchen HYDROmorphone (DILAUDID) 2 MG tablet Take 1 tablet (2 mg total) by mouth every 4 (four) hours as needed for moderate pain or severe pain. 30 tablet 0  . losartan (COZAAR) 25 MG tablet Take 1 tablet (25 mg total) by mouth daily. 30 tablet 6  . magnesium oxide (MAG-OX) 400 MG tablet Take 400 mg by mouth 2 (two) times daily.     Marland Kitchen OVER THE COUNTER MEDICATION Place 1 drop into both eyes daily as needed (red eyes). Eye drops    . tobramycin-dexamethasone (TOBRADEX) ophthalmic solution Place 2 drops into the right eye 2 (two) times daily.  5  . torsemide (DEMADEX) 20 MG tablet Take 1 tablet (20 mg total) by mouth daily. 30 tablet 3   No current facility-administered medications for this visit.    Allergies  Allergen Reactions  . Bidil [Isosorb Dinitrate-Hydralazine]     Headaches   . Spironolactone     Hyperkalemia   .  Hydralazine Hcl     Fuzzy headed    Review of Systems   No fever Positive productive cough Expected postthoracotomy pain Surgical incision well-healed  BP 128/88 mmHg  Pulse 96  Resp 20  Ht 5\' 10"  (1.778 m)  Wt 240 lb (108.863 kg)  BMI 34.44 kg/m2  SpO2 97% Physical Exam Patient is anxious regarding his symptoms of upper respiratory infection, bronchitis cough and sensation of  Wheezing. HEENT-injected the right eye , no JVD Chest-well-healed thoracotomy scar, scattered rhonchi right greater than left Cardiac-regular rhythm without murmur Extremities-trace pedal edema Neuro-intact  Diagnostic Tests: Chest x-ray shows postthoracotomy changes, no pleural  effusion or pneumothorax. No evidence recurrent tumor. No edema or worsening of CHF.No pulmonary infiltrate or consolidation  Impression: Probable sinus congestion with bronchitis and severe coughing.  Plan:patient was given prescriptions for Augmentin, Symbicort inhaler and a 5day  prednisone taper. The patient will get a CT scan of the chest 6 months following resection of the large mediastinal para- ganglioma

## 2014-04-27 ENCOUNTER — Other Ambulatory Visit: Payer: Self-pay | Admitting: Physician Assistant

## 2014-04-29 ENCOUNTER — Telehealth: Payer: Self-pay | Admitting: Pulmonary Disease

## 2014-04-29 DIAGNOSIS — G4733 Obstructive sleep apnea (adult) (pediatric): Secondary | ICD-10-CM

## 2014-04-29 NOTE — Telephone Encounter (Signed)
Order has been placed for CPAP mask. Nothing further was needed.

## 2014-05-01 ENCOUNTER — Telehealth: Payer: Self-pay | Admitting: Pulmonary Disease

## 2014-05-01 NOTE — Telephone Encounter (Signed)
Spoke with the pt  He states that he spoke with the [facility] today and was told that they did not receive our order for CPAP mask  Looks like we did send the order  Will forward to [facility] to handle  Thanks

## 2014-05-01 NOTE — Telephone Encounter (Signed)
Sent message to [email or web address]  to ck on this order was given to them a few days ago [name]

## 2014-05-01 NOTE — Telephone Encounter (Signed)
Spoke to [email or web address]  order was received and spoke to pt he has already been called and has an appt set up fir 05/02/14 to get download off cpap Joellen [place]

## 2014-05-05 ENCOUNTER — Ambulatory Visit: Payer: 59 | Admitting: Pulmonary Disease

## 2014-05-08 ENCOUNTER — Other Ambulatory Visit: Payer: Self-pay | Admitting: Physician Assistant

## 2014-05-11 ENCOUNTER — Other Ambulatory Visit: Payer: Self-pay | Admitting: Physician Assistant

## 2014-05-14 ENCOUNTER — Other Ambulatory Visit: Payer: Self-pay

## 2014-05-14 ENCOUNTER — Ambulatory Visit (HOSPITAL_COMMUNITY)
Admission: RE | Admit: 2014-05-14 | Discharge: 2014-05-14 | Disposition: A | Discharge status: Home / Self Care | Payer: 59 | Source: Ambulatory Visit | Attending: Internal Medicine | Admitting: Internal Medicine

## 2014-05-14 VITALS — BP 104/82 | HR 92 | Wt 236.4 lb

## 2014-05-14 DIAGNOSIS — M1000 Idiopathic gout, unspecified site: Secondary | ICD-10-CM

## 2014-05-14 DIAGNOSIS — Z95810 Presence of automatic (implantable) cardiac defibrillator: Secondary | ICD-10-CM | POA: Diagnosis not present

## 2014-05-14 DIAGNOSIS — G4733 Obstructive sleep apnea (adult) (pediatric): Secondary | ICD-10-CM

## 2014-05-14 DIAGNOSIS — I420 Dilated cardiomyopathy: Secondary | ICD-10-CM | POA: Insufficient documentation

## 2014-05-14 DIAGNOSIS — I5022 Chronic systolic (congestive) heart failure: Secondary | ICD-10-CM | POA: Insufficient documentation

## 2014-05-14 DIAGNOSIS — I2510 Atherosclerotic heart disease of native coronary artery without angina pectoris: Secondary | ICD-10-CM | POA: Diagnosis not present

## 2014-05-14 DIAGNOSIS — Z79891 Long term (current) use of opiate analgesic: Secondary | ICD-10-CM | POA: Diagnosis not present

## 2014-05-14 DIAGNOSIS — C679 Malignant neoplasm of bladder, unspecified: Secondary | ICD-10-CM | POA: Diagnosis not present

## 2014-05-14 DIAGNOSIS — I5042 Chronic combined systolic (congestive) and diastolic (congestive) heart failure: Secondary | ICD-10-CM

## 2014-05-14 DIAGNOSIS — I129 Hypertensive chronic kidney disease with stage 1 through stage 4 chronic kidney disease, or unspecified chronic kidney disease: Secondary | ICD-10-CM | POA: Insufficient documentation

## 2014-05-14 DIAGNOSIS — E669 Obesity, unspecified: Secondary | ICD-10-CM | POA: Insufficient documentation

## 2014-05-14 DIAGNOSIS — Z79899 Other long term (current) drug therapy: Secondary | ICD-10-CM | POA: Insufficient documentation

## 2014-05-14 DIAGNOSIS — E039 Hypothyroidism, unspecified: Secondary | ICD-10-CM | POA: Diagnosis not present

## 2014-05-14 DIAGNOSIS — I472 Ventricular tachycardia: Secondary | ICD-10-CM | POA: Insufficient documentation

## 2014-05-14 DIAGNOSIS — M109 Gout, unspecified: Secondary | ICD-10-CM | POA: Insufficient documentation

## 2014-05-14 MED ORDER — CARVEDILOL 3.125 MG PO TABS: 3.1250 mg | ORAL_TABLET | Freq: Two times a day (BID) | ORAL | Status: DC

## 2014-05-14 MED ORDER — TORSEMIDE 10 MG PO TABS: ORAL_TABLET | ORAL | Status: DC

## 2014-05-14 NOTE — Patient Instructions (Signed)
Torsemide:  Take 10mg  tablet once daily.                      On Wednesday and Saturdays, take extra tablet (20mg  total).  Follow up 8 weeks.  Do the following things EVERYDAY: 1) Weigh yourself in the morning before breakfast. Write it down and keep it in a log. 2) Take your medicines as prescribed 3) Eat low salt foods-Limit salt (sodium) to 2000 mg per day.  4) Stay as active as you can everyday 5) Limit all fluids for the day to less than 2 liters

## 2014-05-14 NOTE — Progress Notes (Signed)
Patient ID: [name], male   DOB: 09-30-1960, 54 y.o.   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   He is intolerant to numerous HF meds including spiro (severe hyperkalemia - requiring HD), hydralazine (fuzzy-headed) and imdur (severe HAs). S/P 12/17/12 bladder cancer removal.   11/29/11 ECHO EF 15% Diffuse hypokinesis. Features are consistent with a pseudonormal left ventricular filling pattern, with concomitant abnormal relaxation and increased filling pressure (grade 2 diastolic dysfunction).   Admitted to [facility] 01/26/2012 after his ICD fired 5 times Potassium >7.5 Required urgent dialysis. Discharged form [facility] 01/29/12. Discharge weight 250 pounds.   CPX 07/12/13 FVC 1.55 (37%)  FEV1 1.19 (36%)  FEV1/FVC 77%  MVV 53 (35%) Exercise Time: 3:30 Speed (mph): 1.5 Grade (%): 0  RPE: 15 Resting HR: 109 Peak HR: 141 (84% age predicted max HR) BP rest: 130/90 BP peak: 142/96 Peak VO2: 9.0 (34.7% predicted peak VO2)  VE/VCO2 slope: 32.0 OUES: 1.90 Peak RER: 0.90 Ventilatory Threshold: Could not be detected VE/MVV: 58.5% PETCO2 at peak: 33 O2pulse: 8 (44% predicted O2pulse)  ECHO 02/25/13 EF <15%   Admitted in July for [facility] procedure for biopsy of a right mid mediastinal mass. At the time of surgery the mass was quite vascular and and attempted wedge biopsy resulted in severe bleeding. Needle aspirate of the mass more posteriorly resulted in bleeding as well. Both sites were surgically closed. The mediastinal tissue on the surface of the mass was sent for pathology which was unremarkable. A postoperative CT scan with contrast showed no clear feeding  blood vessel to suggest this was a hemangioma or AV malformation. While in hospital treated with milrinone and renal function improved. 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   Post-op He did develop gout in his right knee which required a Depo-Medrol injection by his rheumatologist and uloric increased.    He is S/P 02/14/2015 RVATS and R thoracotomy  With resection paraganglioma.   Follow up for Heart Failure: Doing ok but stressed about his son. Son currently at [facility] due to tumors on spine. Having ongoing fatigue and pain from thoracotomy. Denies SOB, orthopnea, CP or PND. Constantly clearing his throat. Having difficulty with CPAP. Wears CPAP at night. Weight at home 232 pounds. Appetite better. Following a low salt diet and drinking less than 2L a day. Working full time.    Labs  12/12/12 dig level 0.9 12/18/12 Magnesium 1.1 K 4.0 Creatinine 1.5 01/02/13: Uric acid 12.9, K+ 3.9, Cr 1.37, AST 21, ALT 19, Mag 1.3 01/29/13 K 4.1 Creatinine 1.5 Magnesium 1.5 03/21/13 Magnesium 1.7 TSH 1.11--> synthorid increased to 100 mcg.  7/15: K 4, creatinine 1.35 8/15: digoxin 0.5 12/2013: K+ 4.4, creatinine 1.39  ROS: All systems negative except as listed in HPI, PMH and Problem List.  Past Medical History  Diagnosis Date  . Gout 08/03/12    PT C/O OF RIGHT KNEE PAIN AND SWELLING -STATES GOUT FLARE UP - ONGOING SINCE APRIL - BUT SWELLING W/IN LAST WEEK  . Cardiomyopathy     Idiopathic dilated;   Marland Kitchen Ventricular tachycardia     s/p ICD  . Sleep apnea     CPAP  .  Systolic CHF     EF 64%  . CKD (chronic kidney disease)     stage III baseline Crt 1.8-2.0  . Biventricular ICD (implantable cardiac defibrillator) Medtronic ]     DOI 2008/ upgrade 2010/ Gen Change 2014  . Automatic implantable cardioverter-defibrillator in situ   . CHF (congestive heart failure)   . Bladder tumor     PT HOSP AT [facility] 4/24 TO 4/26 2014 WITH  UTI--AND FOUND TO HAVE BLADDER TUMOR-  . Arthritis     GOUT  . Hilar mass     Noted CT 2010  . HTN (hypertension)     Dr. Caryl Comes cardiologist  . Coronary artery disease   . Hypothyroidism   . 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 atorvastatin (LIPITOR) 40 MG tablet Take 1 tablet (40 mg total) by mouth at bedtime. 30 tablet 2  . carvedilol (COREG) 3.125 MG tablet Take 1 tablet (3.125 mg total) by mouth 2 (two) times daily with a meal. 60 tablet 1  . colchicine 0.6 MG tablet Take 0.6 mg by mouth daily as needed (gout).    Marland Kitchen digoxin (LANOXIN) 0.25 MG tablet Take 0.125 mg by mouth daily.    . Febuxostat (ULORIC) 80 MG TABS Take 80 mg by mouth at bedtime.    . magnesium oxide (MAG-OX) 400 MG tablet Take 400 mg by mouth 2 (two) times daily.     Marland Kitchen OVER THE COUNTER MEDICATION Place 1 drop into both eyes daily as needed (red eyes). Eye drops    . tobramycin-dexamethasone (TOBRADEX) ophthalmic solution Place 2 drops into the right eye 2 (two) times daily.  5  . torsemide (DEMADEX) 20 MG tablet Take 1 tablet (20 mg total) by mouth daily. (Patient taking differently: Take 10 mg by mouth daily. ) 30 tablet 3  . HYDROmorphone (DILAUDID) 2 MG tablet Take 1 tablet (2 mg total) by mouth every 4 (four) hours as needed for moderate pain or severe pain. (Patient not taking: Reported on 05/14/2014) 30 tablet 0  . losartan (COZAAR) 25 MG tablet Take 1 tablet (25 mg total) by mouth daily. (Patient not taking: Reported on 05/14/2014) 30 tablet 6   No current facility-administered medications for this encounter.     Filed Vitals:   05/14/14 1148  BP: 104/82  Pulse: 92  Weight: 236 lb 6.4 oz (107.23 kg)  SpO2: 95%    PHYSICAL EXAM: General: Chronically ill appearing.  No resp difficulty HEENT: normal Neck: supple. JVP flat  Carotids 2+ bilaterally; no bruits. No lymphadenopathy or thryomegaly  appreciated. R neck scar  Cor: PMI normal. Regular rate & rhythm. No rubs, gallops or murmurs. Lungs: clear Abdomen: obese, soft, nontender, nondistended. No hepatosplenomegaly. No bruits or masses. Good bowel sounds. Extremities: no cyanosis, clubbing, rash, edema Neuro: alert & orientedx3, cranial nerves grossly intact. Moves all 4 extremities w/o difficulty. Affect pleasant.   ASSESSMENT & PLAN:  1) Chronic systolic HF: NICM, EF 40% (02/2013) s/p Medtronic Optivol  ICD 04/2012 . - NYHA II-III symptoms and volume status stable.  Today we fine tuned his diuretics regimen to help with his work schedule. Optivol reviewed. Volume has been elevated for some time but now back down as he was able to take 20 mg torsemide for a few days. Today he will take torsemide 10 mg daily except 20 mg on Wed and Saturday. Plan to review  Optivol in one month.  - Due to fatigue. I will continue carvedilol 3.125 mg twice a day. And hopefully increase next visit.   - Continue digoxin 0.125 mg daily and losartan 25 mg daily.  - He is intolerant to numerous HF meds including spiro (severe hyperkalemia - requiring HD), hydralazine (fuzzy-headed) and imdur (severe HAs). - Reinforced the need and importance of daily weights, a low sodium diet, and fluid restriction (less than 2 L a day). Instructed to call the HF clinic if weight increases more than 3 lbs overnight or 5 lbs in a week.  2) Right thoracic apex mass:  Paraganglioma.  S/P resection. 12/15. Followed by Dr Darcey Nora.  3) OSA: Has follow up with pulmonary to adjust CPAP.  4) Bladder Cancer: Continuing with BCG treatment 5) VT:  Quiescent. Followed by Dr. Caryl Comes 6) Gout: On chronic uloric and colchicine PRN.   Plan to check optivol reading 1 month.   F/U 2 months Ariadna Setter NP-C 05/14/2014

## 2014-05-22 ENCOUNTER — Encounter: Payer: Self-pay | Admitting: Pulmonary Disease

## 2014-05-22 ENCOUNTER — Ambulatory Visit (INDEPENDENT_AMBULATORY_CARE_PROVIDER_SITE_OTHER): Payer: 59 | Admitting: Pulmonary Disease

## 2014-05-22 ENCOUNTER — Ambulatory Visit (INDEPENDENT_AMBULATORY_CARE_PROVIDER_SITE_OTHER)
Admission: RE | Admit: 2014-05-22 | Discharge: 2014-05-22 | Disposition: A | Discharge status: Home / Self Care | Payer: 59 | Source: Ambulatory Visit | Attending: Pulmonary Disease | Admitting: Pulmonary Disease

## 2014-05-22 VITALS — BP 146/88 | HR 109 | Ht 70.0 in | Wt 238.0 lb

## 2014-05-22 DIAGNOSIS — R0600 Dyspnea, unspecified: Secondary | ICD-10-CM | POA: Insufficient documentation

## 2014-05-22 DIAGNOSIS — G4733 Obstructive sleep apnea (adult) (pediatric): Secondary | ICD-10-CM

## 2014-05-22 MED ORDER — OMEPRAZOLE 40 MG PO CPDR: 40.0000 mg | DELAYED_RELEASE_CAPSULE | Freq: Two times a day (BID) | ORAL | Status: DC

## 2014-05-22 NOTE — Addendum Note (Signed)
Addended by: Inge Rise on: 05/22/2014 12:09 PM   Modules accepted: Orders

## 2014-05-22 NOTE — Patient Instructions (Signed)
Start on zyrtec 10mg  one each day for postnasal drip Will start on omeprazole 40mg  one in am and pm for reflux Will schedule for xray of your diaphragm to see if it is moving.  Will call you with results.  If you have worsening of your breathing, let us know.

## 2014-05-22 NOTE — Assessment & Plan Note (Signed)
The patient is having difficulty wearing his C Pap since his surgery, and he blames this on a feeling of smothering whenever he lies down. We'll work on his pulmonary issue to see if we can improve things, and then I suspect he will do well with CPAP as he has done in the past.

## 2014-05-22 NOTE — Assessment & Plan Note (Signed)
The patient is describing an abnormal feeling in his chest when he tries to breathe, but it is not pleuritic in nature. This may simply be related to his recent thoracoscopic surgery, although on his chest x-ray today to diaphragm is a little more elevated than in the past. Because he had a paramediastinal mass, I wonder if he has a paralyzed right hemidiaphragm which is resulting in his symptoms. He is not really having a lot of shortness of breath, just an abnormal sensation of breathing. He does not have a lot of heart failure on his x-ray, nor significant lower extremity edema. He does have a known severe cardiomyopathy. A lot of his symptoms are in his throat, where he is having a tickle, hoarseness, and throat clearing. Will treat for postnasal drip and reflux and see if this improves. My only other thought is whether he could have thromboembolic disease after his recent surgery, however there is no history to support this.

## 2014-05-22 NOTE — Progress Notes (Signed)
   Subjective:    Patient ID: [name], male    DOB: 02-02-1961, 54 y.o.   MRN: [id number]  HPI The patient comes in today for follow-up of his obstructive sleep apnea. However, his main complaint is that of worsening hoarseness with cough and some increased shortness of breath. He has had recent thoracoscopic surgery on the right for resection of a right mediastinal ganglioneuroma. Since the surgery, he has had some fullness in his throat, hoarseness, and is continually clearing his throat. He also feels an abnormal sensation in his right chest when he coughs, although he is just had thoracic surgery. He admits to having postnasal drip and also reflux at times. Unfortunately, he has not been able to wear his C Pap at night compliantly because of his feeling of smothering.   Review of Systems  Constitutional: Negative for fever, chills, activity change, appetite change and unexpected weight change.  HENT: Negative for congestion, dental problem, postnasal drip, rhinorrhea, sneezing, sore throat, trouble swallowing and voice change.   Eyes: Negative for visual disturbance.  Respiratory: Negative for cough, choking and shortness of breath.   Cardiovascular: Negative for chest pain and leg swelling.  Gastrointestinal: Negative for nausea, vomiting and abdominal pain.  Genitourinary: Negative for difficulty urinating.  Musculoskeletal: Negative for arthralgias.  Skin: Negative for rash.  Psychiatric/Behavioral: Negative for behavioral problems and confusion.       Objective:   Physical Exam Obese male in nad Nose without purulence or d/c No skin breakdown or pressure necrosis from cpap mask Neck without lymphadenopathy or thyromegaly Chest with very diminished breath sounds on the right, but no wheezes or crackles. Left lung is clear Cardiac exam with mildly tachycardic but regular rhythm Lower extremities with mild edema noted, no cyanosis and no calf tenderness Neurological he is  alert and oriented, moves all 4 extremities.        Assessment & Plan:  sob

## 2014-05-27 ENCOUNTER — Other Ambulatory Visit (HOSPITAL_COMMUNITY): Payer: Self-pay | Admitting: *Deleted

## 2014-05-27 ENCOUNTER — Ambulatory Visit (HOSPITAL_COMMUNITY)
Admission: RE | Admit: 2014-05-27 | Discharge: 2014-05-27 | Disposition: A | Discharge status: Home / Self Care | Payer: 59 | Source: Ambulatory Visit | Attending: Pulmonary Disease | Admitting: Pulmonary Disease

## 2014-05-27 DIAGNOSIS — R0600 Dyspnea, unspecified: Secondary | ICD-10-CM

## 2014-05-27 DIAGNOSIS — J986 Disorders of diaphragm: Secondary | ICD-10-CM | POA: Insufficient documentation

## 2014-05-27 MED ORDER — DIGOXIN 250 MCG PO TABS
0.1250 mg | ORAL_TABLET | Freq: Every day | ORAL | Status: DC
Start: 2014-05-27 — End: 2014-09-17

## 2014-06-04 ENCOUNTER — Ambulatory Visit: Payer: 59 | Admitting: Pulmonary Disease

## 2014-06-05 ENCOUNTER — Encounter: Payer: Self-pay | Admitting: Internal Medicine

## 2014-06-15 ENCOUNTER — Other Ambulatory Visit: Payer: Self-pay | Admitting: Pulmonary Disease

## 2014-09-01 ENCOUNTER — Other Ambulatory Visit: Payer: Self-pay | Admitting: *Deleted

## 2014-09-01 DIAGNOSIS — D447 Neoplasm of uncertain behavior of aortic body and other paraganglia: Secondary | ICD-10-CM

## 2014-09-05 ENCOUNTER — Telehealth: Payer: Self-pay | Admitting: Internal Medicine

## 2014-09-05 ENCOUNTER — Ambulatory Visit (INDEPENDENT_AMBULATORY_CARE_PROVIDER_SITE_OTHER): Payer: 59 | Admitting: *Deleted

## 2014-09-05 DIAGNOSIS — I5042 Chronic combined systolic (congestive) and diastolic (congestive) heart failure: Secondary | ICD-10-CM

## 2014-09-05 DIAGNOSIS — I472 Ventricular tachycardia: Secondary | ICD-10-CM

## 2014-09-05 DIAGNOSIS — Z95810 Presence of automatic (implantable) cardiac defibrillator: Secondary | ICD-10-CM | POA: Diagnosis not present

## 2014-09-05 DIAGNOSIS — I4729 Other ventricular tachycardia: Secondary | ICD-10-CM

## 2014-09-05 NOTE — Telephone Encounter (Signed)
°  Device message 4. Are you calling to see if we received your device transmission? y  Comments; Pt would also like to receive a call back to disucss receiving another box//sr

## 2014-09-05 NOTE — Telephone Encounter (Signed)
Pt called [facility]. Tech svcs sent Staci Acosta but will charge pt $10/mo. Pt aware we can get him a free [place]. Pt will return [place] that was sent to him once we order one for him. If pt keeps [place] that was sent to him, he will be charged. Pt will be stopping his analog phone service soon. Manual remote sent.   During call, we were disconnected.   LMOVM stating: No alerts. 8 NSVT (I called them short "blips," nothing to cause him concern)---longest 11 beats. OptiVol stable.

## 2014-09-06 ENCOUNTER — Other Ambulatory Visit (HOSPITAL_COMMUNITY): Payer: Self-pay | Admitting: Internal Medicine

## 2014-09-06 ENCOUNTER — Other Ambulatory Visit: Payer: Self-pay | Admitting: Adult Health

## 2014-09-10 LAB — CUP PACEART REMOTE DEVICE CHECK
Brady Statistic AS VS Percent: 0.92 %
HIGH POWER IMPEDANCE MEASURED VALUE: 56 Ohm
HighPow Impedance: 46 Ohm
Lead Channel Impedance Value: 285 Ohm
Lead Channel Impedance Value: 342 Ohm
Lead Channel Impedance Value: 418 Ohm
Lead Channel Impedance Value: 456 Ohm
Lead Channel Impedance Value: 589 Ohm
Lead Channel Pacing Threshold Amplitude: 0.5 V
Lead Channel Pacing Threshold Amplitude: 0.75 V
Lead Channel Pacing Threshold Pulse Width: 0.4 ms
Lead Channel Pacing Threshold Pulse Width: 0.4 ms
Lead Channel Sensing Intrinsic Amplitude: 17.5 mV
Lead Channel Sensing Intrinsic Amplitude: 17.5 mV
Lead Channel Sensing Intrinsic Amplitude: 2 mV
Lead Channel Setting Pacing Amplitude: 2 V
Lead Channel Setting Pacing Amplitude: 2.5 V
Lead Channel Setting Pacing Pulse Width: 0.4 ms
Lead Channel Setting Sensing Sensitivity: 0.6 mV
MDC IDC MSMT BATTERY REMAINING LONGEVITY: 68 mo
MDC IDC MSMT BATTERY VOLTAGE: 2.98 V
MDC IDC MSMT LEADCHNL LV IMPEDANCE VALUE: 456 Ohm
MDC IDC MSMT LEADCHNL RA PACING THRESHOLD PULSEWIDTH: 0.4 ms
MDC IDC MSMT LEADCHNL RA SENSING INTR AMPL: 2 mV
MDC IDC MSMT LEADCHNL RV PACING THRESHOLD AMPLITUDE: 0.75 V
MDC IDC SESS DTM: 20160701222929
MDC IDC SET LEADCHNL LV PACING AMPLITUDE: 1.75 V
MDC IDC SET LEADCHNL LV PACING PULSEWIDTH: 0.4 ms
MDC IDC SET ZONE DETECTION INTERVAL: 250 ms
MDC IDC SET ZONE DETECTION INTERVAL: 400 ms
MDC IDC STAT BRADY AP VP PERCENT: 0.01 %
MDC IDC STAT BRADY AP VS PERCENT: 0 %
MDC IDC STAT BRADY AS VP PERCENT: 99.07 %
MDC IDC STAT BRADY RA PERCENT PACED: 0.01 %
MDC IDC STAT BRADY RV PERCENT PACED: 97.74 %
Zone Setting Detection Interval: 300 ms
Zone Setting Detection Interval: 410 ms
Zone Setting Detection Interval: 450 ms

## 2014-09-10 NOTE — Telephone Encounter (Signed)
Clarified to pt that he will not be charged for [employer] but would be charged if he upgraded to 24950 model. Pt aware to keep [facility] w/out fees he must use 2490 model w/ [place]. Pt aware he will be due to see Dr. Caryl Comes 12/2014 and aware recall will put him in charge to call clinic to make an appt. Pt aware I could not schedule his appt w/ Dr. Caryl Comes in Oct due to template not being released.

## 2014-09-10 NOTE — Progress Notes (Signed)
Remote CRT-D device check. Thresholds and sensing consistent with previous device measurements. Lead impedance trends stable over time. No mode switch episodes recorded. 8 NSVT---5 EGMs---longest 11 beats. Patient bi-ventricularly pacing 98.5% of the time. Device programmed with appropriate safety margins. Heart failure diagnostics reviewed and trends are stable for patient. Estimated longevity 5.49yrs. Due to see SK 10/16.

## 2014-09-17 ENCOUNTER — Other Ambulatory Visit (HOSPITAL_COMMUNITY): Payer: Self-pay | Admitting: Adult Health

## 2014-09-19 ENCOUNTER — Ambulatory Visit (INDEPENDENT_AMBULATORY_CARE_PROVIDER_SITE_OTHER): Payer: 59 | Admitting: Family Medicine

## 2014-09-19 ENCOUNTER — Encounter: Payer: Self-pay | Admitting: Family Medicine

## 2014-09-19 ENCOUNTER — Ambulatory Visit (INDEPENDENT_AMBULATORY_CARE_PROVIDER_SITE_OTHER): Payer: 59 | Admitting: Pulmonary Disease

## 2014-09-19 ENCOUNTER — Ambulatory Visit (INDEPENDENT_AMBULATORY_CARE_PROVIDER_SITE_OTHER): Payer: 59

## 2014-09-19 ENCOUNTER — Encounter: Payer: Self-pay | Admitting: Pulmonary Disease

## 2014-09-19 VITALS — BP 128/82 | HR 99 | Ht 70.0 in | Wt 234.0 lb

## 2014-09-19 VITALS — BP 106/84 | HR 104 | Temp 98.1°F | Resp 20 | Ht 69.5 in | Wt 234.0 lb

## 2014-09-19 DIAGNOSIS — Z8551 Personal history of malignant neoplasm of bladder: Secondary | ICD-10-CM | POA: Insufficient documentation

## 2014-09-19 DIAGNOSIS — G4733 Obstructive sleep apnea (adult) (pediatric): Secondary | ICD-10-CM | POA: Diagnosis not present

## 2014-09-19 DIAGNOSIS — R0600 Dyspnea, unspecified: Secondary | ICD-10-CM | POA: Diagnosis not present

## 2014-09-19 DIAGNOSIS — R0689 Other abnormalities of breathing: Secondary | ICD-10-CM | POA: Diagnosis not present

## 2014-09-19 DIAGNOSIS — M25559 Pain in unspecified hip: Secondary | ICD-10-CM | POA: Insufficient documentation

## 2014-09-19 DIAGNOSIS — R5383 Other fatigue: Secondary | ICD-10-CM

## 2014-09-19 DIAGNOSIS — M7061 Trochanteric bursitis, right hip: Secondary | ICD-10-CM

## 2014-09-19 DIAGNOSIS — N182 Chronic kidney disease, stage 2 (mild): Secondary | ICD-10-CM | POA: Diagnosis not present

## 2014-09-19 DIAGNOSIS — Z8679 Personal history of other diseases of the circulatory system: Secondary | ICD-10-CM | POA: Insufficient documentation

## 2014-09-19 DIAGNOSIS — D447 Neoplasm of uncertain behavior of aortic body and other paraganglia: Secondary | ICD-10-CM

## 2014-09-19 DIAGNOSIS — M25551 Pain in right hip: Secondary | ICD-10-CM

## 2014-09-19 DIAGNOSIS — Z8739 Personal history of other diseases of the musculoskeletal system and connective tissue: Secondary | ICD-10-CM | POA: Insufficient documentation

## 2014-09-19 LAB — POCT CBC
Granulocyte percent: 79.2 %G (ref 37–80)
HCT, POC: 47.8 % (ref 43.5–53.7)
Hemoglobin: 15.5 g/dL (ref 14.1–18.1)
Lymph, poc: 2 (ref 0.6–3.4)
MCH, POC: 27.3 pg (ref 27–31.2)
MCHC: 32.4 g/dL (ref 31.8–35.4)
MCV: 84.1 fL (ref 80–97)
MID (CBC): 0.3 (ref 0–0.9)
MPV: 7.7 fL (ref 0–99.8)
POC GRANULOCYTE: 8.6 — AB (ref 2–6.9)
POC LYMPH %: 18.4 % (ref 10–50)
POC MID %: 2.4 %M (ref 0–12)
Platelet Count, POC: 245 10*3/uL (ref 142–424)
RBC: 5.68 M/uL (ref 4.69–6.13)
RDW, POC: 17.5 %
WBC: 10.8 10*3/uL — AB (ref 4.6–10.2)

## 2014-09-19 LAB — URIC ACID: URIC ACID, SERUM: 7 mg/dL (ref 4.0–7.8)

## 2014-09-19 LAB — COMPREHENSIVE METABOLIC PANEL
ALT: 22 U/L (ref 0–53)
AST: 20 U/L (ref 0–37)
Albumin: 3.9 g/dL (ref 3.5–5.2)
Alkaline Phosphatase: 71 U/L (ref 39–117)
BUN: 35 mg/dL — AB (ref 6–23)
CHLORIDE: 102 meq/L (ref 96–112)
CO2: 26 meq/L (ref 19–32)
CREATININE: 1.58 mg/dL — AB (ref 0.50–1.35)
Calcium: 9.4 mg/dL (ref 8.4–10.5)
GLUCOSE: 118 mg/dL — AB (ref 70–99)
POTASSIUM: 4.9 meq/L (ref 3.5–5.3)
Sodium: 137 mEq/L (ref 135–145)
TOTAL PROTEIN: 7.3 g/dL (ref 6.0–8.3)
Total Bilirubin: 0.5 mg/dL (ref 0.2–1.2)

## 2014-09-19 LAB — TSH: TSH: 1.395 u[IU]/mL (ref 0.350–4.500)

## 2014-09-19 LAB — MAGNESIUM: Magnesium: 1.8 mg/dL (ref 1.5–2.5)

## 2014-09-19 MED ORDER — TRIAMCINOLONE ACETONIDE 40 MG/ML IJ SUSP
40.0000 mg | Freq: Once | INTRAMUSCULAR | Status: AC
Start: 2014-09-19 — End: 2014-09-19
  Administered 2014-09-19: 40 mg via INTRAMUSCULAR

## 2014-09-19 MED ORDER — HYDROCODONE-ACETAMINOPHEN 5-325 MG PO TABS: ORAL_TABLET | ORAL | Status: DC

## 2014-09-19 NOTE — Progress Notes (Signed)
   Subjective:    Patient ID: [name], male    DOB: September 25, 1960, 54 y.o.   MRN: [id number]  HPI 54 year old never smoker with chronic systolic heart failure status post AICD presents for follow-up of OSA and dyspnea. [place] pt He underwent  right thoracotomy  for resection of a right mediastinal ganglioneuroma. his main complaint is that of increasing dyspnea since the surgery.during his last visit in 05/2014 with Dr.Clance and was felt that he had postnasal drip and reflux symptoms causing some fullness in his throat and hoarseness    09/19/2014  Chief Complaint  Patient presents with  . Follow-up    Breathing not doing well since last visit. Still having SOB. CPAP machine not working right.    Treated for PND & reflux C/o persistent dyspnea No wheeze or pedal edema Seen at [facility] today for hip bursitis Unfortunately, he has not been able to wear his C Pap at night compliantly because of his feeling of smothering.  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 Gout 08/03/12    PT C/O OF RIGHT KNEE PAIN AND SWELLING -STATES GOUT FLARE UP - ONGOING SINCE APRIL - BUT SWELLING W/IN LAST WEEK  . Cardiomyopathy     Idiopathic dilated;   Marland Kitchen Ventricular tachycardia     s/p ICD  . Sleep apnea     CPAP  . Systolic CHF     EF 45%  . CKD (chronic kidney disease)     stage III baseline Crt 1.8-2.0  . Biventricular ICD (implantable cardiac defibrillator) Medtronic ]     DOI 2008/ upgrade 2010/ Gen Change 2014  . Automatic implantable cardioverter-defibrillator in situ   . CHF (congestive heart failure)   . Bladder tumor     PT HOSP AT [facility] 4/24 TO 4/26 2014 WITH UTI--AND FOUND TO HAVE BLADDER TUMOR-  . Arthritis     GOUT  . Hilar mass     Noted CT 2010  . HTN (hypertension)     Dr. Caryl Comes cardiologist  . Coronary artery disease   . Hypothyroidism   . Paraganglioma     //neuroendocrine tumor of  the right chest per notes 02/10/2014     Review of Systems  neg for any significant sore throat, dysphagia, itching, sneezing, nasal congestion or excess/ purulent secretions, fever, chills, sweats, unintended wt loss, pleuritic or exertional cp, hempoptysis, orthopnea pnd or change in chronic leg swelling. Also denies presyncope, palpitations, heartburn, abdominal pain, nausea, vomiting, diarrhea or change in bowel or urinary habits, dysuria,hematuria, rash, arthralgias, visual complaints, headache, numbness weakness or ataxia.     Objective:   Physical Exam  Gen. Pleasant, obese, in no distress ENT - no lesions, no post nasal drip Neck: No JVD, no thyromegaly, no carotid bruits Lungs: no use of accessory muscles, no dullness to percussion, decreased rt base without rales or rhonchi  Cardiovascular: Rhythm regular, heart sounds  normal, no murmurs or gallops, no peripheral edema Musculoskeletal: No deformities, no cyanosis or clubbing , no tremors        Assessment & Plan:

## 2014-09-19 NOTE — Progress Notes (Signed)
Subjective:  Patient ID: [name], male    DOB: 09-04-1960  Age: 54 y.o. MRN: [id number]  54 year old man here for a couple things. He has been having pain in his right hip over the weekend. Nose no specific injury. It just started hurting him. He does have a history of gout and has been on medications for that. The gout has been in the knees in the past. He hurts when he moves his right leg around. He has also had pain even when laying in bed. Does not have to be weightbearing.  He's had many health issues. He has history of chronic kidney disease and is followed by nephrologist for that, trying to keep well-hydrated keep it stable. He has had a benign tumor removed from his right chest, paraganglioma, late last year. He has had a bladder tumor which has been treated at [facility] and continues getting some BCG treatments for that. His energy level is been down. He has a pacemaker and see his cardiologist. He has a history of his magnesium level getting low and he takes magnesium supplementation. He is also on medicine for acid reflux. Most of his other medications related to his heart meds. He has been fatigued more lately. He would like labs checked.   Objective:   Overweight gentleman, pleasant alert. Throat clear. TMs normal. Neck supple without nodes. Chest is clear to auscultation. Heart regular without murmurs. Abdomen soft without masses or tenderness. He is tender in the lateral right hip, greater trochanteric area, but it is a little hard to pinpoint the pain as much as typical for trochanteric bursitis. He has extremities are unremarkable with trace of edema. Does have crepitance in his knees. Gait is satisfactory though it hurts in the right hip. He does not hurt on motion of the hip joint itself.  Assessment & Plan:   Assessment: Right hip pain, probably trochanteric bursitis Fatigue History of cardiac arrhythmia History of paraganglioma of chest History of bladder  tumor  Plan: X-ray right hip Due to the fatigue will check a CBC, C met, magnesium, and TSH. There are no Patient Instructions on file for this visit. Results for orders placed or performed in visit on 09/19/14  POCT CBC  Result Value Ref Range   WBC 10.8 (A) 4.6 - 10.2 K/uL   Lymph, poc 2.0 0.6 - 3.4   POC LYMPH PERCENT 18.4 10 - 50 %L   MID (cbc) 0.3 0 - 0.9   POC MID % 2.4 0 - 12 %M   POC Granulocyte 8.6 (A) 2 - 6.9   Granulocyte percent 79.2 37 - 80 %G   RBC 5.68 4.69 - 6.13 M/uL   Hemoglobin 15.5 14.1 - 18.1 g/dL   HCT, POC 47.8 43.5 - 53.7 %   MCV 84.1 80 - 97 fL   MCH, POC 27.3 27 - 31.2 pg   MCHC 32.4 31.8 - 35.4 g/dL   RDW, POC 17.5 %   Platelet Count, POC 245 142 - 424 K/uL   MPV 7.7 0 - 99.8 fL   [facility] reading (PRIMARY) by  Dr. Thamas [place] right femur.  Mild arthritic changes  Procedure note: Discuss the risks of infection and couple case with the patient who understands. Using sterile technique the area was prepped. Point tenderness have been localized. Ethyl chloride was used for an aesthetic. 1 mL Kenalog-40 and 2 mL of 2% lidocaine were injected without difficulty into the trigger point area of the bursitis. Patient tolerated  the procedure well. He was instructed in its care.Marland Kitchen    HOPPER,DAVID, MD 09/19/2014

## 2014-09-19 NOTE — Assessment & Plan Note (Addendum)
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 I have asked him to sleep upright with a wedge propped up on 3 pillows in the meantime

## 2014-09-19 NOTE — Patient Instructions (Signed)
Take the Norco 5 (hydrocodone/APAP) every 4-6 hours only if needed for severe pain.(contains acetaminophen)  Take Tylenol 1000 mg  (500 mg 2) every 8 hours if needed for pain.(contains acetaminophen)  Both the Norco and Tylenol containing acetaminophen, and between the 2 medications he should not exceed a total of 6 or 7 pills in 24 hours  Apply ice for about 15 or 20 minutes every couple of hours this evening and tomorrow if needed  If you are getting worse at anytime return  If not continuing to improve over the next month or 6 weeks return and we can do a repeat injection or we can refer you to an orthopedic doctor.

## 2014-09-19 NOTE — Patient Instructions (Addendum)
Your right side of diaphragm appears to be paralysed Bipap titration study CT chest without contrast

## 2014-09-20 NOTE — Assessment & Plan Note (Signed)
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 He does not seem to be in overt CHF. A repeat CT scan has been scheduled-and we will follow this, unfortunately due to poor renal function he cannot get contrast, if this is unrevealing we may have to proceed with a VQ scan

## 2014-09-22 ENCOUNTER — Telehealth: Payer: Self-pay | Admitting: Internal Medicine

## 2014-09-22 NOTE — Telephone Encounter (Signed)
Spoke with patient- clarified that wireless adapter is working and we are receiving transmissions. He reports that on the 14th his son said he had an episode of shaking and that he hit his head during the episode. Reviewed transmission sent after this episode with the patient- no episodes, optivol normal. He says he recently has missed doses of his diuretic- instructed to resume taking as directed. Reassured that the remote transmission doesn't show any episodes and encouraged him to follow up with his PCP. Patient is appreciative.

## 2014-09-22 NOTE — Telephone Encounter (Signed)
New message    Pt sent transmission on July 13 and 14th.  Pt has new device and pt wants to make sure we received transmission. Pt also has question regarding July 14th transmission Please call to discuss

## 2014-09-30 ENCOUNTER — Encounter: Payer: Self-pay | Admitting: Pulmonary Disease

## 2014-09-30 ENCOUNTER — Ambulatory Visit
Admission: RE | Admit: 2014-09-30 | Discharge: 2014-09-30 | Disposition: A | Discharge status: Home / Self Care | Payer: 59 | Source: Ambulatory Visit | Attending: Cardiothoracic Surgery | Admitting: Cardiothoracic Surgery

## 2014-09-30 DIAGNOSIS — D447 Neoplasm of uncertain behavior of aortic body and other paraganglia: Secondary | ICD-10-CM

## 2014-10-01 ENCOUNTER — Ambulatory Visit (HOSPITAL_COMMUNITY)
Admission: RE | Admit: 2014-10-01 | Discharge: 2014-10-01 | Disposition: A | Discharge status: Home / Self Care | Payer: 59 | Source: Ambulatory Visit | Attending: Internal Medicine | Admitting: Internal Medicine

## 2014-10-01 ENCOUNTER — Ambulatory Visit (INDEPENDENT_AMBULATORY_CARE_PROVIDER_SITE_OTHER): Payer: 59 | Admitting: Cardiothoracic Surgery

## 2014-10-01 ENCOUNTER — Encounter: Payer: Self-pay | Admitting: Cardiothoracic Surgery

## 2014-10-01 ENCOUNTER — Other Ambulatory Visit: Payer: 59

## 2014-10-01 VITALS — BP 125/91 | HR 98 | Resp 18 | Ht 69.5 in | Wt 230.0 lb

## 2014-10-01 VITALS — BP 152/98 | HR 109 | Wt 231.0 lb

## 2014-10-01 DIAGNOSIS — I2510 Atherosclerotic heart disease of native coronary artery without angina pectoris: Secondary | ICD-10-CM | POA: Insufficient documentation

## 2014-10-01 DIAGNOSIS — M109 Gout, unspecified: Secondary | ICD-10-CM | POA: Insufficient documentation

## 2014-10-01 DIAGNOSIS — I129 Hypertensive chronic kidney disease with stage 1 through stage 4 chronic kidney disease, or unspecified chronic kidney disease: Secondary | ICD-10-CM | POA: Insufficient documentation

## 2014-10-01 DIAGNOSIS — I429 Cardiomyopathy, unspecified: Secondary | ICD-10-CM | POA: Diagnosis not present

## 2014-10-01 DIAGNOSIS — E039 Hypothyroidism, unspecified: Secondary | ICD-10-CM | POA: Diagnosis not present

## 2014-10-01 DIAGNOSIS — I5042 Chronic combined systolic (congestive) and diastolic (congestive) heart failure: Secondary | ICD-10-CM | POA: Diagnosis not present

## 2014-10-01 DIAGNOSIS — Z95810 Presence of automatic (implantable) cardiac defibrillator: Secondary | ICD-10-CM | POA: Insufficient documentation

## 2014-10-01 DIAGNOSIS — D447 Neoplasm of uncertain behavior of aortic body and other paraganglia: Secondary | ICD-10-CM

## 2014-10-01 DIAGNOSIS — Z9889 Other specified postprocedural states: Secondary | ICD-10-CM

## 2014-10-01 DIAGNOSIS — G4733 Obstructive sleep apnea (adult) (pediatric): Secondary | ICD-10-CM | POA: Insufficient documentation

## 2014-10-01 DIAGNOSIS — C679 Malignant neoplasm of bladder, unspecified: Secondary | ICD-10-CM | POA: Diagnosis not present

## 2014-10-01 DIAGNOSIS — Z79899 Other long term (current) drug therapy: Secondary | ICD-10-CM | POA: Diagnosis not present

## 2014-10-01 DIAGNOSIS — N183 Chronic kidney disease, stage 3 (moderate): Secondary | ICD-10-CM | POA: Diagnosis not present

## 2014-10-01 DIAGNOSIS — I5022 Chronic systolic (congestive) heart failure: Secondary | ICD-10-CM | POA: Diagnosis not present

## 2014-10-01 NOTE — Progress Notes (Addendum)
PCP is Gennette Pac, MD Referring Provider is Collene Gobble, MD  Chief Complaint  Patient presents with  . 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 The patient has been followed by pulmonary for his sleep apnea and shortness of breath. A sniff test with fluoroscopy was performed which showed nonfunctional right hemidiaphragm. 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  The patient states he has severe orthopnea and his CPAP she is now working and makes him feel "smothered". He has decreased exercise tolerance and is completely exhausted by the afternoon. When he bends over to tie his shoes he gets extremely weak and diaphoretic. The patient has known idiopathic cardiomyopathy with ejection fraction of 15%.  Chest CT scan performed today shows no evidence recurrent paraganglioma. There is no significant right pleural effusion but there is some venous vascular congestion.  Past Medical History  Diagnosis Date  . Gout 08/03/12    PT C/O OF RIGHT KNEE PAIN AND SWELLING -STATES GOUT FLARE UP - ONGOING SINCE APRIL - BUT SWELLING W/IN LAST WEEK  . Cardiomyopathy     Idiopathic dilated;   Marland Kitchen Ventricular tachycardia     s/p ICD  . Sleep apnea     CPAP  . Systolic CHF     EF 45%  . CKD (chronic kidney disease)     stage III baseline Crt 1.8-2.0  . Biventricular ICD (implantable cardiac defibrillator) Medtronic ]     DOI 2008/ upgrade 2010/ Gen Change 2014  . Automatic implantable cardioverter-defibrillator in situ   . CHF (congestive heart failure)   . Bladder  tumor     PT HOSP AT [facility] 4/24 TO 4/26 2014 WITH UTI--AND FOUND TO HAVE BLADDER TUMOR-  . Arthritis     GOUT  . Hilar mass     Noted CT 2010  . HTN (hypertension)     Dr. Caryl Comes cardiologist  . Coronary artery disease   . Hypothyroidism   . 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 Cholecystectomy    . Cardiac catheterization      he was found to have normal coronary arteries but with a globally dilated and hypocontractile heart  . US echocardiography  03-17-2008, 07-03-2006    EF 15-20%, EF 15-20%  . Transurethral resection of bladder tumor N/A 08/13/2012    Procedure: TRANSURETHRAL RESECTION OF BLADDER TUMOR (TURBT);  Surgeon: Claybon Jabs, MD;  Location: [facility];  Service: Urology;  Laterality: N/A;  MITOMYCIN C  . Cardiac defibrillator placement      DOI 2008/ upgrade 2010/ Gen Change 2014   . Endobronchial ultrasound Bilateral 10/01/2012    Procedure: ENDOBRONCHIAL ULTRASOUND;  Surgeon: Collene Gobble, MD;  Location: [facility] ENDOS[facility];  Service: Cardiopulmonary;  Laterality: Bilateral;  . Cornea replacement    . Video bronchoscopy N/A 10/25/2012    Procedure: VIDEO BRONCHOSCOPY;  Surgeon: [name], MD;  Location: [place];  Service: Thoracic;  Laterality: N/A;  . Mediastinoscopy N/A 10/25/2012    Procedure:  MEDIASTINOSCOPY;  Surgeon: [name], MD;  Location: [facility] OR;  Service: Thoracic;  Laterality: N/A;  . Tonsillectomy    . Mediastinotomy  09/10/2013    paratracheal mass    DR VANTRIGT  . Chest tube insertion  09/10/2013  . Mediastinotomy chamberlain mcneil Right 09/10/2013    Procedure: MEDIASTINOTOMY CHAMBERLAIN MCNEIL;  Surgeon: [name], MD;  Location: [place];  Service: Thoracic;  Laterality: Right;  . Biv icd genertaor change out      DOI 2008/ upgrade 2010/ Gen Change 2014   . Right heart catheterization N/A 01/27/2012    Procedure: RIGHT HEART CATH;  Surgeon: Jolaine Artist, MD;  Location: [facility] CATH LAB;  Service: Cardiovascular;  Laterality: N/A;  . Implantable cardioverter defibrillator (icd) generator change N/A 05/02/2012    Procedure: ICD GENERATOR CHANGE;  Surgeon: Deboraha Sprang, MD;  Location: [facility] CATH LAB;  Service: Cardiovascular;  Laterality: N/A;  . Video assisted thoracoscopy (vats)/thorocotomy Right 02/13/2014    Procedure: VIDEO ASSISTED THORACOSCOPY (VATS)/THOROCOTOMY;  Surgeon: [name], MD;  Location: [facility] OR;  Service: Thoracic;  Laterality: Right;  PATIENT NEEDS EPIDURAL CATHETER (DR. CHRIS MOSER AWARE PER PVT)  . Resection of mediastinal mass Right 02/13/2014    Procedure: RESECTION OF MEDIASTINAL MASS;  Surgeon: [name], MD;  Location: [facility] OR;  Service: Thoracic;  Laterality: Right;  PATIENT NEEDS EPIDURAL CATHETER AND A SWAN GANZ CATHETER (DR. CHRIS MOSER AWARE PER PVT)    No family history on file.  Social History History  Substance Use Topics  . Smoking status: Never Smoker   . Smokeless tobacco: Never Used  . Alcohol Use: No    Current Outpatient Prescriptions  Medication Sig Dispense Refill  . atorvastatin (LIPITOR) 40 MG tablet Take 1 tablet (40 mg total) by mouth at bedtime. 30 tablet 2  . carvedilol (COREG) 3.125 MG tablet TAKE 1 TABLET (3.125 MG TOTAL) BY MOUTH 2 (TWO) TIMES DAILY WITH A MEAL. 60 tablet 3  . digoxin (LANOXIN) 0.25 MG tablet TAKE 1/2 TAB (0.125 MG TOTAL) BY MOUTH DAILY. 15 tablet 3  . Febuxostat (ULORIC) 80 MG TABS Take 80 mg by mouth at bedtime.    Marland Kitchen HYDROcodone-acetaminophen (NORCO) 5-325 MG per tablet Take one every 4-6 hours if needed for moderately severe pain 20 tablet 0  . losartan (COZAAR) 25 MG tablet TAKE 1 TABLET (25 MG TOTAL) BY MOUTH DAILY. 30 tablet 6  . magnesium oxide (MAG-OX) 400 MG tablet Take 400 mg by mouth 2 (two) times daily.     Marland Kitchen omeprazole (PRILOSEC) 40 MG capsule TAKE 1 CAPSULE (40 MG TOTAL) BY MOUTH 2 (TWO) TIMES DAILY. 60 capsule 2  . tobramycin-dexamethasone  (TOBRADEX) ophthalmic solution Place 2 drops into the right eye 2 (two) times daily.  5  . torsemide (DEMADEX) 10 MG tablet Take 10mg  tablet once daily.  On Wednesday and Saturdays, take extra tablet (20mg  total). 40 tablet 3   No current facility-administered medications for this visit.    Allergies  Allergen Reactions  . Bidil [Isosorb Dinitrate-Hydralazine]     Headaches   . Spironolactone     Hyperkalemia   . Hydralazine Hcl     Fuzzy headed    Review of Systems He denies fever. He has been losing weight. A meal he complains of pulmonary problems including dyspnea on exertion, orthopnea, decreased exercise tolerance. He denies angina or palpitations He denies abdominal bloating he is passing flatus He denies hematuria-he recently had BCG instillation of  his bladder for a known bladder tumor He denies focal weakness change in vision or presyncope BP 125/91 mmHg  Pulse 98  Resp 18  Ht 5' 9.5" (1.765 m)  Wt 230 lb (104.327 kg)  BMI 33.49 kg/m2  SpO2 97% Physical Exam Alert and comfortable some grunting with inspiration HEENT normocephalic pupils equal conjunctivae pink dentition good Neck without JVD or adenopathy Right chest with well-healed thoracotomy scar, breath sounds slightly diminished right base Cardiac rhythm regular with positive S4 gallop no murmur Abdomen obese slightly distended nontender Extremities cool but with out edema Neuro nonfocal  Diagnostic Tests: CT scan of chest reviewed showing no evidence of recurrent paraganglioma since surgery over 6 months ago. Noncontrast study so no assessment of possible pulmonary emboli  Impression:6 months status post right thoracotomy for resection of a 8 cm paraganglioma. Now with significant pulmonary problems which are suggestive of progressive heart failure. He'll be evaluated  At the advanced heart failure clinic later today.   Plan: Return in 6 months with CT scan of chest for surveillance following  resection of mediastinal tumor-paraganglioma  Len Childs, MD [facility] [phone]

## 2014-10-01 NOTE — Progress Notes (Signed)
Patient ID: [name], male   DOB: 03-Oct-1960, 54 y.o.   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   He is intolerant to numerous HF meds including spiro (severe hyperkalemia - requiring HD), hydralazine (fuzzy-headed) and imdur (severe HAs). S/P 12/17/12 bladder cancer removal.   11/29/11 ECHO EF 15% Diffuse hypokinesis. Features are consistent with a pseudonormal left ventricular filling pattern, with concomitant abnormal relaxation and increased filling pressure (grade 2 diastolic dysfunction).   Admitted to [facility] 01/26/2012 after his ICD fired 5 times Potassium >7.5 Required urgent dialysis. Discharged form [facility] 01/29/12. Discharge weight 250 pounds.   CPX 07/12/13 FVC 1.55 (37%)  FEV1 1.19 (36%)  FEV1/FVC 77%  MVV 53 (35%) Exercise Time: 3:30 Speed (mph): 1.5 Grade (%): 0  RPE: 15 Resting HR: 109 Peak HR: 141 (84% age predicted max HR) BP rest: 130/90 BP peak: 142/96 Peak VO2: 9.0 (34.7% predicted peak VO2)  VE/VCO2 slope: 32.0 OUES: 1.90 Peak RER: 0.90 Ventilatory Threshold: Could not be detected VE/MVV: 58.5% PETCO2 at peak: 33 O2pulse: 8 (44% predicted O2pulse)  ECHO 02/25/13 EF <15%   He is S/P 02/13/2014 RVATS and R thoracotomy  With resection of paraganglioma. Required milrinone support post-operatively.   Acute Work in for Dyspnea.  Saw Dr Charlsie Merles earlier today and had complaints of increase dyspnea. + Bendopnea. + Orthopnea.  Son currently at [facility] due to tumors on spine. Denies orthopnea, CP or PND. Constantly clearing his throat. Frequent cold sweats. Has to stop every few yards when walking. Having  difficulty with CPAP.  Weight at home down to 228 pounds.  Appetite better. Following a low salt diet and drinking less than 2L a day. Working full time.    Labs  12/12/12 dig level 0.9 12/18/12 Magnesium 1.1 K 4.0 Creatinine 1.5 01/02/13: Uric acid 12.9, K+ 3.9, Cr 1.37, AST 21, ALT 19, Mag 1.3 01/29/13 K 4.1 Creatinine 1.5 Magnesium 1.5 03/21/13 Magnesium 1.7 TSH 1.11--> synthorid increased to 100 mcg.  7/15: K 4, creatinine 1.35 8/15: digoxin 0.5 12/2013: K+ 4.4, creatinine 1.39  ROS: All systems negative except as listed in HPI, PMH and Problem List.  Past Medical History  Diagnosis Date  . Gout 08/03/12    PT C/O OF RIGHT KNEE PAIN AND SWELLING -STATES GOUT FLARE UP - ONGOING SINCE APRIL - BUT SWELLING W/IN LAST WEEK  . Cardiomyopathy     Idiopathic dilated;   Marland Kitchen Ventricular tachycardia     s/p ICD  . Sleep apnea     CPAP  . Systolic CHF     EF 15%  . CKD (chronic kidney disease)     stage III baseline Crt 1.8-2.0  . Biventricular ICD (implantable cardiac defibrillator) Medtronic ]     DOI 2008/ upgrade 2010/ Gen Change 2014  . Automatic implantable cardioverter-defibrillator in situ   . CHF (congestive heart failure)   . Bladder tumor     PT HOSP AT [facility] 4/24 TO 4/26 2014 WITH UTI--AND FOUND TO HAVE BLADDER TUMOR-  . Arthritis     GOUT  . Hilar mass     Noted CT 2010  . HTN (hypertension)     Dr. Caryl Comes cardiologist  .  Coronary artery disease   . Hypothyroidism   . 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 atorvastatin (LIPITOR) 40 MG tablet Take 1 tablet (40 mg total) by mouth at bedtime. 30 tablet 2  . carvedilol (COREG) 3.125 MG tablet TAKE 1 TABLET (3.125 MG TOTAL) BY MOUTH 2 (TWO) TIMES DAILY WITH A MEAL. 60 tablet 3  . digoxin (LANOXIN) 0.25 MG tablet TAKE 1/2 TAB (0.125 MG TOTAL) BY MOUTH DAILY. 15 tablet 3  . Febuxostat (ULORIC) 80 MG TABS Take 80  mg by mouth at bedtime.    Marland Kitchen HYDROcodone-acetaminophen (NORCO) 5-325 MG per tablet Take one every 4-6 hours if needed for moderately severe pain 20 tablet 0  . losartan (COZAAR) 25 MG tablet TAKE 1 TABLET (25 MG TOTAL) BY MOUTH DAILY. 30 tablet 6  . magnesium oxide (MAG-OX) 400 MG tablet Take 400 mg by mouth 2 (two) times daily.     Marland Kitchen tobramycin-dexamethasone (TOBRADEX) ophthalmic solution Place 2 drops into the right eye 2 (two) times daily.  5  . torsemide (DEMADEX) 10 MG tablet Take 10mg  tablet once daily.  On Wednesday and Saturdays, take extra tablet (20mg  total). 40 tablet 3  . omeprazole (PRILOSEC) 40 MG capsule TAKE 1 CAPSULE (40 MG TOTAL) BY MOUTH 2 (TWO) TIMES DAILY. (Patient not taking: Reported on 10/01/2014) 60 capsule 2   No current facility-administered medications for this encounter.     Filed Vitals:   10/01/14 1534  BP: 152/98  Pulse: 109  Weight: 231 lb (104.781 kg)  SpO2: 95%    PHYSICAL EXAM: General: Chronically ill appearing.  No resp difficulty. Skin damp. HEENT: normal Neck: supple. JVP 8  Carotids 2+ bilaterally; no bruits. No lymphadenopathy or thryomegaly appreciated. R neck scar  Cor: PMI laterally displaced . Regular rate & rhythm. +s3 Lungs: clear Abdomen: obese, soft, nontender, nondistended. No hepatosplenomegaly. No bruits or masses. Good bowel sounds. Extremities: no cyanosis, clubbing, rash, edema Neuro: alert & orientedx3, cranial nerves grossly intact. Moves all 4 extremities w/o difficulty. Affect pleasant.   ASSESSMENT & PLAN:  1) Chronic systolic HF: NICM, EF 26% (02/2013) s/p Medtronic Optivol  ICD 04/2012 . - NYHA III-IIIB symptoms and volume status stable.  ICD  interrogated. Fluid well below threshold. Activity about 1 hour per day. No AF/VT Continue 10 mg torsemide daily.  -Continue carvedilol 3.125 mg twice a day.  - Continue digoxin 0.125 mg daily and losartan 25 mg daily.  - Consider corlanor.  ECHO next visit.  - He is intolerant  to numerous HF meds including spiro (severe hyperkalemia - requiring HD), hydralazine (fuzzy-headed) and imdur (severe HAs). - Reinforced the need and importance of daily weights, a low sodium diet, and fluid restriction (less than 2 L a day). Instructed to call the HF clinic if weight increases more than 3 lbs overnight or 5 lbs in a week.  2) Right thoracic apex mass:  Paraganglioma.  S/P resection. 12/15. Followed by Dr Darcey Nora.  3) OSA: Has sleep study next month. Plan for BiPap 4) Bladder Cancer: Continuing with BCG treatment 5) VT:  Quiescent. Followed by Dr. Caryl Comes 6) Gout: On chronic uloric and colchicine PRN.  7) OSA- Intolerant CPAP. Has sleep study next week for BiPap.   F/U 4 weeks with ECHO CLEGG,AMY NP-C 10/01/2014  He continues with NYHA III-IIIB symptoms. Volume status looks ok on exam and ICD interrogation. His HF  therapy has been limited by numerous med intolerances. Today we discussed trying to restart hydralazine or starting Corlanor but he wanted to wait. I think he is nearing the point where he will need advanced therapies (i.e. Home milrinone) but he is not wanting to consider that yet. Will see him back soon with repeat echo. Very low threshold to proceed with RHC.   Bensimhon, Daniel,MD 11:13 PM

## 2014-10-01 NOTE — Patient Instructions (Signed)
Your physician has requested that you have an echocardiogram. Echocardiography is a painless test that uses sound waves to create images of your heart. It provides your doctor with information about the size and shape of your heart and how well your heart's chambers and valves are working. This procedure takes approximately one hour. There are no restrictions for this procedure.  Your physician recommends that you schedule a follow-up appointment in: 4 weeks with an echocardiogram  Do the following things EVERYDAY: 1) Weigh yourself in the morning before breakfast. Write it down and keep it in a log. 2) Take your medicines as prescribed 3) Eat low salt foods-Limit salt (sodium) to 2000 mg per day.  4) Stay as active as you can everyday 5) Limit all fluids for the day to less than 2 liters 6)

## 2014-10-10 ENCOUNTER — Encounter: Payer: Self-pay | Admitting: Cardiology

## 2014-10-14 ENCOUNTER — Encounter (HOSPITAL_COMMUNITY): Payer: 59

## 2014-10-14 ENCOUNTER — Encounter: Payer: Self-pay | Admitting: Internal Medicine

## 2014-10-16 ENCOUNTER — Ambulatory Visit (INDEPENDENT_AMBULATORY_CARE_PROVIDER_SITE_OTHER): Payer: 59

## 2014-10-16 ENCOUNTER — Ambulatory Visit (INDEPENDENT_AMBULATORY_CARE_PROVIDER_SITE_OTHER): Payer: 59 | Admitting: Family Medicine

## 2014-10-16 VITALS — BP 121/89 | HR 114 | Temp 99.5°F | Resp 18 | Ht 70.5 in | Wt 228.4 lb

## 2014-10-16 DIAGNOSIS — M25552 Pain in left hip: Secondary | ICD-10-CM

## 2014-10-16 DIAGNOSIS — M7062 Trochanteric bursitis, left hip: Secondary | ICD-10-CM

## 2014-10-16 NOTE — Patient Instructions (Signed)
The injection  Given today should help with inflammation in the bursa, but if there is some soreness in those other muscles around the bursa, that may take a few days to improve. If your symptoms are not improving into next week or any worsening sooner, return to clinic, ER, or we can have you be seen by orthopedics.   Watch for any signs of infection such as increasing pain or redness or discharge from the area that was injected. If you're having more pain with weightbearing, recommend recheck here or emergency room.  Return to the clinic or go to the nearest emergency room if any of your symptoms worsen or new symptoms occur.  Hip Bursitis Bursitis is a swelling and soreness (inflammation) of a fluid-filled sac (bursa). This sac overlies and protects the joints.  CAUSES   Injury.  Overuse of the muscles surrounding the joint.  Arthritis.  Gout.  Infection.  Cold weather.  Inadequate warm-up and conditioning prior to activities. The cause may not be known.  SYMPTOMS   Mild to severe irritation.  Tenderness and swelling over the outside of the hip.  Pain with motion of the hip.  If the bursa becomes infected, a fever may be present. Redness, tenderness, and warmth will develop over the hip. Symptoms usually lessen in 3 to 4 weeks with treatment, but can come back. TREATMENT If conservative treatment does not work, your caregiver may advise draining the bursa and injecting cortisone into the area. This may speed up the healing process. This may also be used as an initial treatment of choice. HOME CARE INSTRUCTIONS   Apply ice to the affected area for 15-20 minutes every 3 to 4 hours while awake for the first 2 days. Put the ice in a plastic bag and place a towel between the bag of ice and your skin.  Rest the painful joint as much as possible, but continue to put the joint through a normal range of motion at least 4 times per day. When the pain lessens, begin normal, slow  movements and usual activities to help prevent stiffness of the hip.  Only take over-the-counter or prescription medicines for pain, discomfort, or fever as directed by your caregiver.  Use crutches to limit weight bearing on the hip joint, if advised.  Elevate your painful hip to reduce swelling. Use pillows for propping and cushioning your legs and hips.  Gentle massage may provide comfort and decrease swelling. SEEK IMMEDIATE MEDICAL CARE IF:   Your pain increases even during treatment, or you are not improving.  You have a fever.  You have heat and inflammation over the involved bursa.  You have any other questions or concerns. MAKE SURE YOU:   Understand these instructions.  Will watch your condition.  Will get help right away if you are not doing well or get worse. Document Released: 08/13/2001 Document Revised: 05/16/2011 Document Reviewed: 03/12/2008 [facility] Patient Information 2015 [place], [place]. This information is not intended to replace advice given to you by your health care provider. Make sure you discuss any questions you have with your health care provider.

## 2014-10-16 NOTE — Progress Notes (Addendum)
Subjective:    Patient ID: [name], male    DOB: 01-14-1961, 54 y.o.   MRN: [id number] This chart was scribed for Merri Ray, MD by Marti Sleigh, Medical Scribe. This patient was seen in Room 12 and the patient's care was started a 9:49 AM.  Chief Complaint  Patient presents with  . Follow-up    bursitis, left hit, started 2 days ago     HPI HPI Comments: [name] is a 54 y.o. male who presents to [facility] complaining of left hip pain for the last four days. Pt states he woke up this morning and was not able to put weight on the hip. Pt had similar sx in July in the right hip, and was diagnosed with trochanteric bursitis. He was given a cortisone shot in right hip with complete relief since that time. Pt denies fever or chills. Pt denies loss feeling in legs or groin. Denies loss of bowel or bladder control. He has no weakness or pain into the groin, only pain in the lateral left hip. Pt has put icyhot on the hip, but has not taken any medications.   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    Patient Active Problem List   Diagnosis Date Noted  . History of cardiac arrhythmia 09/19/2014  . CKD (chronic kidney disease) stage 2, GFR 60-89 ml/min 09/19/2014  . Other fatigue 09/19/2014  . Hip pain 09/19/2014  . History of gout 09/19/2014  . History of bladder carcinoma 09/19/2014  . 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 Chest discomfort 09/10/2013  . Bladder cancer 11/09/2012  . Preoperative cardiovascular examination 09/11/2012  . Malignant tumor of urinary bladder 07/13/2012  . Chronic combined systolic and diastolic CHF (congestive heart failure) 06/28/2012  . Gout 05/17/2012  . Inappropriate shocks from ICD -secondary T wave oversensing in the context of hyperkalemia 04/12/2012  . Biventricular ICD-Medtronic 05/17/2011  . THYROTOX W/O  GOITER/OTH CAUSE W/O CRISIS 11/12/2009  . ORTHOSTATIC DIZZINESS 11/03/2009  . VENTRICULAR TACHYCARDIA 10/20/2008  . SYSTOLIC HEART FAILURE, CHRONIC 10/20/2008  . Dyspnea and respiratory abnormality 03/25/2008  . Obstructive sleep apnea 03/12/2007  . Essential hypertension, benign 03/12/2007  . CARDIOMYOPATHY 03/12/2007   Past Medical History  Diagnosis Date  . Gout 08/03/12    PT C/O OF RIGHT KNEE PAIN AND SWELLING -STATES GOUT FLARE UP - ONGOING SINCE APRIL - BUT SWELLING W/IN LAST WEEK  . Cardiomyopathy     Idiopathic dilated;   Marland Kitchen Ventricular tachycardia     s/p ICD  . Sleep apnea     CPAP  . Systolic CHF     EF 43%  . CKD (chronic kidney disease)     stage III baseline Crt 1.8-2.0  . Biventricular ICD (implantable cardiac defibrillator) Medtronic ]     DOI 2008/ upgrade 2010/ Gen Change 2014  . Automatic implantable cardioverter-defibrillator in situ   . CHF (congestive heart failure)   . Bladder tumor     PT HOSP AT [facility] 4/24 TO 4/26 2014 WITH UTI--AND FOUND TO HAVE BLADDER TUMOR-  . Arthritis     GOUT  . Hilar mass     Noted CT 2010  . HTN (hypertension)     Dr. Caryl Comes cardiologist  . Coronary artery disease   . Hypothyroidism   . 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 Cholecystectomy    . Cardiac catheterization      he was found to have normal coronary arteries but with a globally dilated and hypocontractile heart  . US echocardiography  03-17-2008, 07-03-2006    EF 15-20%, EF 15-20%  . Transurethral resection of bladder tumor N/A 08/13/2012    Procedure: TRANSURETHRAL RESECTION OF BLADDER TUMOR (TURBT);  Surgeon: Claybon Jabs, MD;  Location: [facility];  Service: Urology;  Laterality: N/A;  MITOMYCIN C  . Cardiac defibrillator placement      DOI 2008/ upgrade 2010/ Gen Change 2014   . Endobronchial ultrasound Bilateral 10/01/2012    Procedure: ENDOBRONCHIAL ULTRASOUND;   Surgeon: Collene Gobble, MD;  Location: [facility];  Service: Cardiopulmonary;  Laterality: Bilateral;  . Cornea replacement    . Video bronchoscopy N/A 10/25/2012    Procedure: VIDEO BRONCHOSCOPY;  Surgeon: [name], MD;  Location: [place];  Service: Thoracic;  Laterality: N/A;  . Mediastinoscopy N/A 10/25/2012    Procedure: MEDIASTINOSCOPY;  Surgeon: [name], MD;  Location: [place];  Service: Thoracic;  Laterality: N/A;  . Tonsillectomy    . Mediastinotomy  09/10/2013    paratracheal mass    DR VANTRIGT  . Chest tube insertion  09/10/2013  . Mediastinotomy chamberlain mcneil Right 09/10/2013    Procedure: MEDIASTINOTOMY CHAMBERLAIN MCNEIL;  Surgeon: [name], MD;  Location: [place];  Service: Thoracic;  Laterality: Right;  . Biv icd genertaor change out      DOI 2008/ upgrade 2010/ Gen Change 2014   . Right heart catheterization N/A 01/27/2012    Procedure: RIGHT HEART CATH;  Surgeon: Jolaine Artist, MD;  Location: [facility] CATH LAB;  Service: Cardiovascular;  Laterality: N/A;  . Implantable cardioverter defibrillator (icd) generator change N/A 05/02/2012    Procedure: ICD GENERATOR CHANGE;  Surgeon: Deboraha Sprang, MD;  Location: [facility] CATH LAB;  Service: Cardiovascular;  Laterality: N/A;  . Video assisted thoracoscopy (vats)/thorocotomy Right 02/13/2014    Procedure: VIDEO ASSISTED THORACOSCOPY (VATS)/THOROCOTOMY;  Surgeon: [name], MD;  Location: [facility] OR;  Service: Thoracic;  Laterality: Right;  PATIENT NEEDS EPIDURAL CATHETER (DR. CHRIS MOSER AWARE PER PVT)  . Resection of mediastinal mass Right 02/13/2014    Procedure: RESECTION OF MEDIASTINAL MASS;  Surgeon: [name], MD;  Location: [facility] OR;  Service: Thoracic;  Laterality: Right;  PATIENT NEEDS EPIDURAL CATHETER AND A SWAN GANZ CATHETER (DR. CHRIS MOSER AWARE PER PVT)   Allergies  Allergen Reactions  . Bidil [Isosorb Dinitrate-Hydralazine]     Headaches   . Spironolactone     Hyperkalemia   . Hydralazine  Hcl     Fuzzy headed   Prior to Admission medications   Medication Sig Start Date End Date Taking? Authorizing Provider  atorvastatin (LIPITOR) 40 MG tablet Take 1 tablet (40 mg total) by mouth at bedtime. 12/13/13  Yes Shaune Pascal Bensimhon, MD  carvedilol (COREG) 3.125 MG tablet TAKE 1 TABLET (3.125 MG TOTAL) BY MOUTH 2 (TWO) TIMES DAILY WITH A MEAL. 09/09/14  Yes Amy D Clegg, NP  digoxin (LANOXIN) 0.25 MG tablet TAKE 1/2 TAB (0.125 MG TOTAL) BY MOUTH DAILY. 09/17/14  Yes Amy D Clegg, NP  Febuxostat (ULORIC) 80 MG TABS Take 80 mg by mouth at bedtime.   Yes Historical Provider, MD  HYDROcodone-acetaminophen (NORCO) 5-325 MG per tablet Take one every 4-6 hours if needed for moderately severe pain 09/19/14  Yes Posey Boyer, MD  losartan (COZAAR) 25 MG tablet TAKE 1 TABLET (25 MG TOTAL) BY MOUTH DAILY. 09/09/14  Yes Jolaine Artist, MD  magnesium oxide (MAG-OX) 400 MG tablet Take 400 mg by mouth 2 (two) times daily.    Yes Historical Provider, MD  tobramycin-dexamethasone [facility]) ophthalmic solution Place 2 drops into the right eye 2 (two) times daily. 12/09/13  Yes Historical Provider, MD  torsemide (DEMADEX) 10 MG tablet Take 10mg  tablet once daily.  On Wednesday and Saturdays, take extra tablet (20mg  total). 05/14/14  Yes Amy D Clegg, NP  omeprazole (PRILOSEC) 40 MG capsule TAKE 1 CAPSULE (40 MG TOTAL) BY MOUTH 2 (TWO) TIMES DAILY. Patient not taking: Reported on 10/01/2014 06/16/14   Kathee Delton, MD   Social History   Social History  . Marital Status: Married    Spouse Name: N/A  . Number of Children: 2  . Years of Education: N/A   Occupational History  . [employer]   Social History Main Topics  . Smoking status: Never Smoker   . Smokeless tobacco: Never Used  . Alcohol Use: No  . Drug Use: No  . Sexual Activity: No   Other Topics Concern  . Not on file   Social History Narrative    Review of Systems  Constitutional: Negative for fever and chills.    Musculoskeletal: Positive for arthralgias (Hip pain) and gait problem.  Skin: Negative for color change and wound.  Neurological: Positive for weakness (Secondary to pain). Negative for numbness.       Objective:   Physical Exam  Constitutional: He is oriented to person, place, and time. He appears well-developed and well-nourished. No distress.  HENT:  Head: Normocephalic and atraumatic.  Eyes: Pupils are equal, round, and reactive to light.  Neck: Neck supple.  Cardiovascular: Normal rate.   Pulmonary/Chest: Effort normal. No respiratory distress.  Musculoskeletal: Normal range of motion.  Pain on lateral hip with internal and external rotation pain is lateral only. Negative straight leg raise.   Neurological: He is alert and oriented to person, place, and time. Coordination normal.  Skin: Skin is warm and dry. He is not diaphoretic.  Psychiatric: He has a normal mood and affect. His behavior is normal.  Nursing note and vitals reviewed.   Filed Vitals:   10/16/14 0923  BP: 121/89  Pulse: 114  Temp: 99.5 F (37.5 C)  TempSrc: Oral  Resp: 18  Height: 5' 10.5" (1.791 m)  Weight: 228 lb 6.4 oz (103.602 kg)  SpO2: 98%   [facility] reading (PRIMARY) by  Dr. Carlota Raspberry:  Left hip. Degenerative changes, no fracture identified.  Risks (including but not limited to bleeding and infection,  a well as damage to underlying/ surrounding tissue), benefits, and alternatives discussed for  Left trochanteric bursa injection.   Verbal consent obtained after any questions were answered. Landmarks noted  With point of maximal tenderness identified.  There was slight discomfort posterior to the bursa and to some of the gluteal muscles,  And iliotibial band, but primarily discomfort over trochanteric bursa. Area cleansed with Betadine x3, ethyl chloride spray for topical anesthesia, followed by alcohol swab.  Injected with  22-gauge, 1/2 inch needle with 1 mL of Kenalog 40 mg/mL, with 1 mL lidocaine 1%  plain.  No complications. Bandage applied.  RTC precautions discussed in regards to injection.  Minimal initial change in symptoms, but no worsening.    Assessment & Plan:   [name] is a 54 y.o. male Left hip  pain - Plan: DG HIP UNILAT W OR W/O PELVIS 2-3 VIEWS LEFT  Trochanteric bursitis of left hip  - suspected primarily trochanteric bursitis left hip, with some underlying degenerative changes and possible myalgias of surrounding hip musculature.  Options discussed, but he would like to at least try the trochanteric bursa injection first, as he had improvement with his previous injection the right hip with similar symptoms. Injection performed as above, RTC/ER precautions discussed, and if continued pain or any worsening, needs to return to clinic for further evaluation or emergency room.   No orders of the defined types were placed in this encounter.   Patient Instructions     The injection  Given today should help with inflammation in the bursa, but if there is some soreness in those other muscles around the bursa, that may take a few days to improve. If your symptoms are not improving into next week or any worsening sooner, return to clinic, ER, or we can have you be seen by orthopedics.   Watch for any signs of infection such as increasing pain or redness or discharge from the area that was injected. If you're having more pain with weightbearing, recommend recheck here or emergency room.  Return to the clinic or go to the nearest emergency room if any of your symptoms worsen or new symptoms occur.  Hip Bursitis Bursitis is a swelling and soreness (inflammation) of a fluid-filled sac (bursa). This sac overlies and protects the joints.  CAUSES   Injury.  Overuse of the muscles surrounding the joint.  Arthritis.  Gout.  Infection.  Cold weather.  Inadequate warm-up and conditioning prior to activities. The cause may not be known.  SYMPTOMS   Mild to severe  irritation.  Tenderness and swelling over the outside of the hip.  Pain with motion of the hip.  If the bursa becomes infected, a fever may be present. Redness, tenderness, and warmth will develop over the hip. Symptoms usually lessen in 3 to 4 weeks with treatment, but can come back. TREATMENT If conservative treatment does not work, your caregiver may advise draining the bursa and injecting cortisone into the area. This may speed up the healing process. This may also be used as an initial treatment of choice. HOME CARE INSTRUCTIONS   Apply ice to the affected area for 15-20 minutes every 3 to 4 hours while awake for the first 2 days. Put the ice in a plastic bag and place a towel between the bag of ice and your skin.  Rest the painful joint as much as possible, but continue to put the joint through a normal range of motion at least 4 times per day. When the pain lessens, begin normal, slow movements and usual activities to help prevent stiffness of the hip.  Only take over-the-counter or prescription medicines for pain, discomfort, or fever as directed by your caregiver.  Use crutches to limit weight bearing on the hip joint, if advised.  Elevate your painful hip to reduce swelling. Use pillows for propping and cushioning your legs and hips.  Gentle massage may provide comfort and decrease swelling. SEEK IMMEDIATE MEDICAL CARE IF:   Your pain increases even during treatment, or you are not improving.  You have a fever.  You have heat and inflammation over the involved bursa.  You have any other questions or concerns. MAKE SURE YOU:   Understand these instructions.  Will watch your condition.  Will get help right away if you are not doing well  or get worse. Document Released: 08/13/2001 Document Revised: 05/16/2011 Document Reviewed: 03/12/2008 [facility] Patient Information 2015 [place], [place]. This information is not intended to replace advice given to you by your health  care provider. Make sure you discuss any questions you have with your health care provider.     I personally performed the services described in this documentation, which was scribed in my presence. The recorded information has been reviewed and considered, and addended by me as needed.

## 2014-10-17 ENCOUNTER — Encounter: Payer: Self-pay | Admitting: Internal Medicine

## 2014-10-24 ENCOUNTER — Ambulatory Visit (HOSPITAL_BASED_OUTPATIENT_CLINIC_OR_DEPARTMENT_OTHER): Payer: 59 | Attending: Pulmonary Disease | Admitting: Radiology

## 2014-10-24 VITALS — Ht 70.0 in | Wt 230.0 lb

## 2014-10-24 DIAGNOSIS — I493 Ventricular premature depolarization: Secondary | ICD-10-CM | POA: Diagnosis not present

## 2014-10-24 DIAGNOSIS — G4733 Obstructive sleep apnea (adult) (pediatric): Secondary | ICD-10-CM | POA: Diagnosis not present

## 2014-10-24 DIAGNOSIS — G4730 Sleep apnea, unspecified: Secondary | ICD-10-CM | POA: Diagnosis present

## 2014-10-28 ENCOUNTER — Encounter: Payer: Self-pay | Admitting: Cardiology

## 2014-10-29 ENCOUNTER — Ambulatory Visit (HOSPITAL_COMMUNITY)
Admission: RE | Admit: 2014-10-29 | Discharge: 2014-10-29 | Disposition: A | Discharge status: Home / Self Care | Payer: 59 | Source: Ambulatory Visit | Attending: Family Medicine | Admitting: Family Medicine

## 2014-10-29 DIAGNOSIS — I313 Pericardial effusion (noninflammatory): Secondary | ICD-10-CM | POA: Diagnosis not present

## 2014-10-29 DIAGNOSIS — I129 Hypertensive chronic kidney disease with stage 1 through stage 4 chronic kidney disease, or unspecified chronic kidney disease: Secondary | ICD-10-CM | POA: Insufficient documentation

## 2014-10-29 DIAGNOSIS — I517 Cardiomegaly: Secondary | ICD-10-CM | POA: Diagnosis not present

## 2014-10-29 DIAGNOSIS — N189 Chronic kidney disease, unspecified: Secondary | ICD-10-CM | POA: Insufficient documentation

## 2014-10-29 DIAGNOSIS — I5022 Chronic systolic (congestive) heart failure: Secondary | ICD-10-CM | POA: Diagnosis not present

## 2014-10-29 DIAGNOSIS — I340 Nonrheumatic mitral (valve) insufficiency: Secondary | ICD-10-CM | POA: Insufficient documentation

## 2014-10-29 DIAGNOSIS — I509 Heart failure, unspecified: Secondary | ICD-10-CM | POA: Insufficient documentation

## 2014-10-29 DIAGNOSIS — I351 Nonrheumatic aortic (valve) insufficiency: Secondary | ICD-10-CM | POA: Diagnosis not present

## 2014-10-30 ENCOUNTER — Telehealth: Payer: Self-pay | Admitting: Pulmonary Disease

## 2014-10-30 DIAGNOSIS — G4733 Obstructive sleep apnea (adult) (pediatric): Secondary | ICD-10-CM

## 2014-10-30 NOTE — Telephone Encounter (Signed)
Dc CPAP Start Bipap 12/6 , med FF mask, download in 4 wks OV with me in 6 wks

## 2014-10-30 NOTE — Progress Notes (Signed)
Patient Name: [name], [name] Date: 10/24/2014 Gender: Male D.O.B: 03-26-1960 Age (years): 18 Referring Provider: Kara Mead MD, ABSM Height (inches): 70 Interpreting Physician: Kara Mead MD, ABSM Weight (lbs): 230 RPSGT: Zadie Rhine BMI: 33 MRN: [id number] Neck Size: 18.00  CLINICAL INFORMATION The patient is referred for a BiPAP titration to treat sleep apnea. 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  The channels recorded and monitored were frontal, central and occipital EEG, electrooculogram (EOG), submentalis EMG (chin), nasal and oral airflow, thoracic and abdominal wall motion, anterior tibialis EMG, snore microphone, electrocardiogram, and pulse oximetry. Bilevel positive airway pressure (BPAP) was initiated at the beginning of the study and titrated to treat sleep-disordered breathing.  MEDICATIONS Medications administered by patient during sleep study : Sleep medicine administered - None  RESPIRATORY PARAMETERS Optimal IPAP Pressure (cm): 12 AHI at Optimal Pressure (/hr) 0.0 Optimal EPAP Pressure (cm): 6   Overall Minimal O2 (%): 87.00 Minimal O2 at Optimal Pressure (%): 92.0 SLEEP ARCHITECTURE Start Time: 10:10:50 PM Stop Time: 5:21:44 AM Total Time (min): 430.9 Total Sleep Time (min): 284.0 Sleep Latency (min): 0.0 Sleep Efficiency (%): 65.9 REM Latency (min): 48.5 WASO (min): 146.9 Stage N1 (%): 5.46 Stage N2 (%): 61.62 Stage N3 (%): 0.00 Stage R (%): 32.92 Supine (%): 9.28 Arousal Index (/hr): 15.0       CARDIAC DATA The 2 lead EKG demonstrated pacemaker generated. The mean heart rate was 83.63 beats per minute. Other EKG findings include: PVCs.  LEG MOVEMENT DATA The total Periodic Limb Movements of Sleep (PLMS) were 0.  The PLMS index was 0.00. A PLMS index of <15 is considered normal in adults.  IMPRESSIONS An optimal PAP pressure was selected for this patient ( 12 /6 cm of water) Central sleep apnea was not noted during this titration (CAI = 0.0/h). Mild oxygen desaturations were observed during this titration (min O2 = 87.00%). No snoring was audible during this study. 2-lead EKG demonstrated: PVCs Clinically significant periodic limb movements were not noted during this study. Arousals associated with PLMs were rare.   DIAGNOSIS Obstructive Sleep Apnea (327.23 [G47.33 ICD-10])   RECOMMENDATIONS Trial of BiPAP therapy on 12/6 cm H2O with a Medium size Resmed Full Face Mask AirFit F10 mask and heated humidification. Avoid alcohol, sedatives and other CNS depressants that may worsen sleep apnea and disrupt normal sleep architecture. Sleep hygiene should be reviewed to assess factors that may improve sleep quality. Weight management and regular exercise should be initiated or continued. Return to Sleep Center for re-evaluation after 4 weeks of therapy  Kara Mead MD. [facility]. [facility]

## 2014-10-31 ENCOUNTER — Ambulatory Visit (HOSPITAL_COMMUNITY)
Admission: RE | Admit: 2014-10-31 | Discharge: 2014-10-31 | Disposition: A | Discharge status: Home / Self Care | Payer: 59 | Source: Ambulatory Visit | Attending: Cardiology | Admitting: Cardiology

## 2014-10-31 ENCOUNTER — Telehealth: Payer: Self-pay | Admitting: Pulmonary Disease

## 2014-10-31 VITALS — BP 126/104 | HR 110 | Wt 225.0 lb

## 2014-10-31 DIAGNOSIS — I5022 Chronic systolic (congestive) heart failure: Secondary | ICD-10-CM | POA: Insufficient documentation

## 2014-10-31 DIAGNOSIS — M109 Gout, unspecified: Secondary | ICD-10-CM | POA: Insufficient documentation

## 2014-10-31 DIAGNOSIS — I2510 Atherosclerotic heart disease of native coronary artery without angina pectoris: Secondary | ICD-10-CM | POA: Insufficient documentation

## 2014-10-31 DIAGNOSIS — Z95810 Presence of automatic (implantable) cardiac defibrillator: Secondary | ICD-10-CM | POA: Diagnosis not present

## 2014-10-31 DIAGNOSIS — R0789 Other chest pain: Secondary | ICD-10-CM | POA: Diagnosis not present

## 2014-10-31 DIAGNOSIS — I428 Other cardiomyopathies: Secondary | ICD-10-CM | POA: Diagnosis not present

## 2014-10-31 DIAGNOSIS — N183 Chronic kidney disease, stage 3 (moderate): Secondary | ICD-10-CM | POA: Diagnosis not present

## 2014-10-31 DIAGNOSIS — I129 Hypertensive chronic kidney disease with stage 1 through stage 4 chronic kidney disease, or unspecified chronic kidney disease: Secondary | ICD-10-CM | POA: Insufficient documentation

## 2014-10-31 DIAGNOSIS — Z79899 Other long term (current) drug therapy: Secondary | ICD-10-CM | POA: Insufficient documentation

## 2014-10-31 DIAGNOSIS — E039 Hypothyroidism, unspecified: Secondary | ICD-10-CM | POA: Diagnosis not present

## 2014-10-31 DIAGNOSIS — G4733 Obstructive sleep apnea (adult) (pediatric): Secondary | ICD-10-CM | POA: Diagnosis not present

## 2014-10-31 DIAGNOSIS — C679 Malignant neoplasm of bladder, unspecified: Secondary | ICD-10-CM | POA: Insufficient documentation

## 2014-10-31 DIAGNOSIS — I5042 Chronic combined systolic (congestive) and diastolic (congestive) heart failure: Secondary | ICD-10-CM | POA: Diagnosis not present

## 2014-10-31 MED ORDER — IVABRADINE HCL 5 MG PO TABS: 5.0000 mg | ORAL_TABLET | Freq: Two times a day (BID) | ORAL | Status: DC

## 2014-10-31 MED ORDER — PREGABALIN 150 MG PO CAPS: 150.0000 mg | ORAL_CAPSULE | Freq: Every day | ORAL | Status: DC

## 2014-10-31 NOTE — Patient Instructions (Signed)
START Corlanor 5 mg , one tab twice a day START Lyrica 150 mg, one tab nightly at bedtime  Your physician has recommended that you have a cardiopulmonary stress test (CPX). CPX testing is a non-invasive measurement of heart and lung function. It replaces a traditional treadmill stress test. This type of test provides a tremendous amount of information that relates not only to your present condition but also for future outcomes. This test combines measurements of you ventilation, respiratory gas exchange in the lungs, electrocardiogram (EKG), blood pressure and physical response before, during, and following an exercise protocol.  Your physician recommends that you schedule a follow-up appointment in: 1 month  Do the following things EVERYDAY: 1) Weigh yourself in the morning before breakfast. Write it down and keep it in a log. 2) Take your medicines as prescribed 3) Eat low salt foods-Limit salt (sodium) to 2000 mg per day.  4) Stay as active as you can everyday 5) Limit all fluids for the day to less than 2 liters 6)

## 2014-10-31 NOTE — Telephone Encounter (Signed)
lmtcb X1 for [name].  

## 2014-10-31 NOTE — Progress Notes (Addendum)
Advanced Heart Failure Medication Review by a Pharmacist  Does the patient  feel that his/her medications are working for him/her?  yes  Has the patient been experiencing any side effects to the medications prescribed?  no  Does the patient measure his/her own blood pressure or blood glucose at home?  no   Does the patient have any problems obtaining medications due to transportation or finances?   no  Understanding of regimen: good Understanding of indications: good Potential of compliance: fair    Pharmacist comments:  Mr. [name] is a 54 yo M presenting with a current medication list on his tablet. He seems to have a good understanding of his regimen but admits to missing evening doses of his medications about 1-2 times per week due to falling asleep early. He can take most of his medications in the morning and if he finds that he is forgetting more of his evening doses of his carvedilol, we can consider changing his carvedilol to the CR formulation to be taken once daily in the morning.   [name], PharmD, BCPS, CPP Clinical Pharmacist Pager: [phone] Phone: [phone] 10/31/2014 12:07 PM   ADDENDUM: [name] Corlanor PA form faxed in.   [name], PharmD, BCPS, CPP Clinical Pharmacist Pager: [phone] Phone: [phone] 10/31/2014 1:36 PM

## 2014-10-31 NOTE — Telephone Encounter (Signed)
Patient notified. Order entered. Nothing further needed.

## 2014-10-31 NOTE — Progress Notes (Signed)
Patient ID: [name], male   DOB: 25-Sep-1960, 54 y.o.   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   He is intolerant to numerous HF meds including spiro (severe hyperkalemia - requiring HD), hydralazine (fuzzy-headed) and imdur (severe HAs). S/P 12/17/12 bladder cancer removal.   11/29/11 ECHO EF 15% Diffuse hypokinesis. Features are consistent with a pseudonormal left ventricular filling pattern, with concomitant abnormal relaxation and increased filling pressure (grade 2 diastolic dysfunction).   Admitted to [facility] 01/26/2012 after his ICD fired 5 times Potassium >7.5 Required urgent dialysis. Discharged form [facility] 01/29/12. Discharge weight 250 pounds.   CPX 07/12/13 FVC 1.55 (37%)  FEV1 1.19 (36%)  FEV1/FVC 77%  MVV 53 (35%) Exercise Time: 3:30 Speed (mph): 1.5 Grade (%): 0  RPE: 15 Resting HR: 109 Peak HR: 141 (84% age predicted max HR) BP rest: 130/90 BP peak: 142/96 Peak VO2: 9.0 (34.7% predicted peak VO2)  VE/VCO2 slope: 32.0 OUES: 1.90 Peak RER: 0.90 Ventilatory Threshold: Could not be detected VE/MVV: 58.5% PETCO2 at peak: 33 O2pulse: 8 (44% predicted O2pulse)  ECHO 02/25/13 EF <15%   He is S/P 02/13/2014 RVATS and R thoracotomy  With resection of paraganglioma. Required milrinone support post-operatively.   He presents today for HF follow up. At last visit discussed trying to restart hydralazine vs starting on corlanor but he wanted to wait. The discussion was opened concerning advanced therapies, but he was also unwilling to consider them at this point.  He is down 6 lbs from this previous visit. Has SOB at rest and  DOE.  + bendopnea, orthopnea, and occasional PND.  Still has occasional cold sweats.  And still has to stop every few steps when walking.  Has significant R diaphragm pain s/p right apex mass removal in 12/15. Weight at home down to 224 pounds.  Appetite much better. Following a low salt diet and drinking less than 2L a day. Working full time in [employer].  Optivol: shows fluid status low and stable. Pt activity ~ 1 hr per day.  Labs  12/12/12 dig level 0.9 12/18/12 Magnesium 1.1 K 4.0 Creatinine 1.5 01/02/13: Uric acid 12.9, K+ 3.9, Cr 1.37, AST 21, ALT 19, Mag 1.3 01/29/13 K 4.1 Creatinine 1.5 Magnesium 1.5 03/21/13 Magnesium 1.7 TSH 1.11--> synthorid increased to 100 mcg.  7/15: K 4, creatinine 1.35 8/15: digoxin 0.5 12/2013: K+ 4.4, creatinine 1.39 7/16 K 4.9, creatinine 1.58  ROS: All systems negative except as listed in HPI, PMH and Problem List.  Past Medical History  Diagnosis Date  . Gout 08/03/12    PT C/O OF RIGHT KNEE PAIN AND SWELLING -STATES GOUT FLARE UP - ONGOING SINCE APRIL - BUT SWELLING W/IN LAST WEEK  . Cardiomyopathy     Idiopathic dilated;   Marland Kitchen Ventricular tachycardia     s/p ICD  . Sleep apnea     CPAP  . Systolic CHF     EF 63%  . CKD (chronic kidney disease)     stage III baseline Crt 1.8-2.0  . Biventricular ICD (implantable cardiac defibrillator) Medtronic ]     DOI 2008/ upgrade 2010/ Gen Change 2014  . Automatic implantable cardioverter-defibrillator in situ   .  CHF (congestive heart failure)   . Bladder tumor     PT HOSP AT [facility] 4/24 TO 4/26 2014 WITH UTI--AND FOUND TO HAVE BLADDER TUMOR-  . Arthritis     GOUT  . Hilar mass     Noted CT 2010  . HTN (hypertension)     Dr. Caryl Comes cardiologist  . Coronary artery disease   . Hypothyroidism   . 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 atorvastatin (LIPITOR) 40 MG tablet Take 1 tablet (40  mg total) by mouth at bedtime. 30 tablet 2  . carvedilol (COREG) 3.125 MG tablet TAKE 1 TABLET (3.125 MG TOTAL) BY MOUTH 2 (TWO) TIMES DAILY WITH A MEAL. 60 tablet 3  . digoxin (LANOXIN) 0.25 MG tablet TAKE 1/2 TAB (0.125 MG TOTAL) BY MOUTH DAILY. 15 tablet 3  . Febuxostat (ULORIC) 80 MG TABS Take 80 mg by mouth at bedtime.    Marland Kitchen HYDROcodone-acetaminophen (NORCO) 5-325 MG per tablet Take one every 4-6 hours if needed for moderately severe pain 20 tablet 0  . losartan (COZAAR) 25 MG tablet TAKE 1 TABLET (25 MG TOTAL) BY MOUTH DAILY. 30 tablet 6  . magnesium oxide (MAG-OX) 400 MG tablet Take 400 mg by mouth 2 (two) times daily.     Marland Kitchen omeprazole (PRILOSEC) 40 MG capsule TAKE 1 CAPSULE (40 MG TOTAL) BY MOUTH 2 (TWO) TIMES DAILY. (Patient not taking: Reported on 10/01/2014) 60 capsule 2  . tobramycin-dexamethasone (TOBRADEX) ophthalmic solution Place 2 drops into the right eye 2 (two) times daily.  5  . torsemide (DEMADEX) 10 MG tablet Take 10mg  tablet once daily.  On Wednesday and Saturdays, take extra tablet (20mg  total). 40 tablet 3   No current facility-administered medications for this encounter.     Filed Vitals:   10/31/14 1110  BP: 132/98  Pulse: 110  Weight: 225 lb (102.059 kg)  SpO2: 98%    PHYSICAL EXAM: General: Chronically ill appearing.  No resp difficulty. Skin damp. HEENT: normal Neck: supple. JVP 8  Carotids 2+ bilaterally; no bruits. No lymphadenopathy or thryomegaly appreciated. R neck scar  Cor: PMI laterally displaced . Regular rate & rhythm. +s3 Lungs: clear Abdomen: obese, soft, nontender, nondistended. No hepatosplenomegaly. No bruits or masses. Good bowel sounds. Extremities: no cyanosis, clubbing, rash, edema Neuro: alert & orientedx3, cranial nerves grossly intact. Moves all 4 extremities w/o difficulty. Affect pleasant.   ASSESSMENT & PLAN:  1) Chronic systolic HF: NICM, EF 25% (02/2013) s/p Medtronic Optivol  ICD 04/2012 . - NYHA III-IIIB symptoms and volume  status stable.  ICD  interrogated. Fluid well below threshold. Activity about 1 hour per day. No AF/VT - Continue 10 mg torsemide daily.  - Continue carvedilol 3.125 mg twice a day.  - Continue digoxin 0.125 mg daily and losartan 25 mg daily.  - Consider corlanor.  - ECHO 8/24 shows  - He is intolerant to numerous HF meds including spiro (severe hyperkalemia - requiring HD), hydralazine (fuzzy-headed) and imdur (severe HAs). - Reinforced the need and importance of daily weights, a low sodium diet, and fluid restriction (less than 2 L a day). Instructed to call the HF clinic if weight increases more than 3 lbs overnight or 5 lbs in a week.  2) Right thoracic apex mass:  Paraganglioma.  S/P resection. 12/15. Followed by Dr Prescott Gum. Still having pain from that. 3) OSA: Has sleep study  next month. Plan for BiPap 4) Bladder Cancer: Continuing with BCG treatment 5) VT:  Quiescent. Followed by Dr. Caryl Comes 6) Gout: On chronic uloric and colchicine PRN.  7) OSA- Intolerant CPAP. Has trial of BiPAP therapy scheduled.    [name] 10/31/2014    Patient seen and examined with Jamie Kilts, [name]. We discussed all aspects of the encounter. I agree with the assessment and plan as stated above.   He continue to be quite tenuous but insists he is feeling ok. I worry he may be low output. He has responded to milrinone in past. Will proceed with CPX testing to further assess. Continue current HF regimen. ICD interrogated personally.   Jamie Riding,[name] 2:35 PM

## 2014-11-03 NOTE — Telephone Encounter (Signed)
Order was cosigned by Dr. Elsworth Soho on 10/31/14. Melissa notified. Nothing further needed.

## 2014-11-06 ENCOUNTER — Telehealth: Payer: Self-pay | Admitting: Pulmonary Disease

## 2014-11-06 NOTE — Telephone Encounter (Signed)
lmomtcb x1 

## 2014-11-07 NOTE — Telephone Encounter (Signed)
Attempted to contact patient lmtcb

## 2014-11-07 NOTE — Telephone Encounter (Signed)
Does he have CPAP ? OK to continue CPAP for now then until OV

## 2014-11-07 NOTE — Telephone Encounter (Signed)
Spoke with Melissa at [facility], patient is refusing BiPAP.  Patient states he cannot afford the BiPAP.  FYI to Dr. Elsworth Soho

## 2014-11-11 ENCOUNTER — Ambulatory Visit: Payer: 59 | Admitting: Adult Health

## 2014-11-11 NOTE — Telephone Encounter (Signed)
Called and spoke with Melissa from [facility] Informed her of Alva's rec to pt to continue on CPAP until next OV  Called and spoke with pt and informed of Alva's rec Pt voiced understanding  Nothing further is needed at this time

## 2014-11-11 NOTE — Telephone Encounter (Signed)
Pt returning call and can be reached @[phone].[name]

## 2014-11-12 ENCOUNTER — Telehealth (HOSPITAL_COMMUNITY): Payer: Self-pay | Admitting: *Deleted

## 2014-11-12 NOTE — Telephone Encounter (Signed)
Completed PA on pt's Corlanor, med approved via [employer]

## 2014-12-01 ENCOUNTER — Encounter: Payer: Self-pay | Admitting: Internal Medicine

## 2014-12-02 ENCOUNTER — Ambulatory Visit: Payer: 59 | Admitting: Pulmonary Disease

## 2014-12-03 ENCOUNTER — Ambulatory Visit (HOSPITAL_BASED_OUTPATIENT_CLINIC_OR_DEPARTMENT_OTHER): Payer: 59

## 2014-12-03 ENCOUNTER — Ambulatory Visit: Payer: 59 | Admitting: Pulmonary Disease

## 2014-12-14 ENCOUNTER — Ambulatory Visit (INDEPENDENT_AMBULATORY_CARE_PROVIDER_SITE_OTHER): Payer: 59 | Admitting: Emergency Medicine

## 2014-12-14 VITALS — BP 112/78 | HR 116 | Temp 100.5°F | Resp 18 | Ht 70.5 in | Wt 225.6 lb

## 2014-12-14 DIAGNOSIS — M25469 Effusion, unspecified knee: Secondary | ICD-10-CM

## 2014-12-14 MED ORDER — PREDNISONE 10 MG PO TABS: ORAL_TABLET | ORAL | Status: DC

## 2014-12-14 NOTE — Progress Notes (Signed)
This chart was scribed for Jamie Queen, MD by Leandra Kern, Medical Scribe. This patient was seen in Room 9 and the patient's care was started at 3:53 PM.  Chief Complaint:  Chief Complaint  Patient presents with  . Gout    left knee, x2-3 days.   . Fever    x1 day    HPI: [name] is a 54 y.o. male with a history of gout who reports to [facility] today complaining of left knee swelling, onset 2 days ago.  Pt states that he walked for a long distance yesterday, and that exacerbated the pain. Pt is requesting to get the fluid in the area drained. He notes that he is compliant with taking his gout medications. Pt is also complaining of a fever.   Pt states that he was diagnosed with diaphragm paralysis, and he was advised to get an EMG done at [facility] on the area to stimulate the diaphragm to contract. Pt sees Dr. Camillo Flaming, pulmonary, at [facility].   Pt does computer work.  Pt's son was diagnosed with a tumor in his spine.    Past Medical History  Diagnosis Date  . Gout 08/03/12    PT C/O OF RIGHT KNEE PAIN AND SWELLING -STATES GOUT FLARE UP - ONGOING SINCE APRIL - BUT SWELLING W/IN LAST WEEK  . Cardiomyopathy     Idiopathic dilated;   Marland Kitchen Ventricular tachycardia ([place])     s/p ICD  . Sleep apnea     CPAP  . Systolic CHF ([place])     EF 15%  . CKD (chronic kidney disease)     stage III baseline Crt 1.8-2.0  . Biventricular ICD (implantable cardiac defibrillator) Medtronic ]     DOI 2008/ upgrade 2010/ Gen Change 2014  . Automatic implantable cardioverter-defibrillator in situ   . CHF (congestive heart failure) ([place])   . Bladder tumor     PT HOSP AT [facility] 4/24 TO 4/26 2014 WITH UTI--AND FOUND TO HAVE BLADDER TUMOR-  . Arthritis     GOUT  . Hilar mass     Noted CT 2010  . HTN (hypertension)     Dr. Caryl Comes cardiologist  . Coronary artery disease   . Hypothyroidism   . Paraganglioma [facility])     //neuroendocrine tumor of the right chest per notes 02/10/2014     Past Surgical History  Procedure Laterality Date  . Cholecystectomy    . Cardiac catheterization      he was found to have normal coronary arteries but with a globally dilated and hypocontractile heart  . US echocardiography  03-17-2008, 07-03-2006    EF 15-20%, EF 15-20%  . Transurethral resection of bladder tumor N/A 08/13/2012    Procedure: TRANSURETHRAL RESECTION OF BLADDER TUMOR (TURBT);  Surgeon: Claybon Jabs, MD;  Location: [facility] ORS;  Service: Urology;  Laterality: N/A;  MITOMYCIN C  . Cardiac defibrillator placement      DOI 2008/ upgrade 2010/ Gen Change 2014   . Endobronchial ultrasound Bilateral 10/01/2012    Procedure: ENDOBRONCHIAL ULTRASOUND;  Surgeon: Collene Gobble, MD;  Location: [facility] ENDOSCOPY;  Service: Cardiopulmonary;  Laterality: Bilateral;  . Cornea replacement    . Video bronchoscopy N/A 10/25/2012    Procedure: VIDEO BRONCHOSCOPY;  Surgeon: [name], MD;  Location: [place];  Service: Thoracic;  Laterality: N/A;  . Mediastinoscopy N/A 10/25/2012    Procedure: MEDIASTINOSCOPY;  Surgeon: [name], MD;  Location: [place];  Service: Thoracic;  Laterality: N/A;  . Tonsillectomy    . Mediastinotomy  09/10/2013    paratracheal mass    DR VANTRIGT  . Chest tube insertion  09/10/2013  . Mediastinotomy chamberlain mcneil Right 09/10/2013    Procedure: MEDIASTINOTOMY CHAMBERLAIN MCNEIL;  Surgeon: [name], MD;  Location: [place];  Service: Thoracic;  Laterality: Right;  . Biv icd genertaor change out      DOI 2008/ upgrade 2010/ Gen Change 2014   . Right heart catheterization N/A 01/27/2012    Procedure: RIGHT HEART CATH;  Surgeon: Jolaine Artist, MD;  Location[facility] C[facility] LAB;  Service: Cardiovascular;  Laterality: N/A;  . Implantable cardioverter defibrillator (icd) generator change N/A 05/02/2012    Procedure: ICD GENERATOR CHANGE;  Surgeon: Deboraha Sprang, MD;  Location: [facility] CATH LAB;  Service: Cardiovascular;  Laterality: N/A;  . Video assisted thoracoscopy  (vats)/thorocotomy Right 02/13/2014    Procedure: VIDEO ASSISTED THORACOSCOPY (VATS)/THOROCOTOMY;  Surgeon: [name], MD;  Location: [facility] OR;  Service: Thoracic;  Laterality: Right;  PATIENT NEEDS EPIDURAL CATHETER (DR. CHRIS MOSER AWARE PER PVT)  . Resection of mediastinal mass Right 02/13/2014    Procedure: RESECTION OF MEDIASTINAL MASS;  Surgeon: [name], MD;  Location: [facility] OR;  Service: Thoracic;  Laterality: Right;  PATIENT NEEDS EPIDURAL CATHETER AND A SWAN GANZ CATHETER (DR. CHRIS MOSER AWARE PER PVT)   Social History   Social History  . Marital Status: Married    Spouse Name: N/A  . Number of Children: 2  . Years of Education: N/A   Occupational History  . [place]   Social History Main Topics  . Smoking status: Never Smoker   . Smokeless tobacco: Never Used  . Alcohol Use: No  . Drug Use: No  . Sexual Activity: No   Other Topics Concern  . None   Social History Narrative   No family history on file. Allergies  Allergen Reactions  . Bidil [Isosorb Dinitrate-Hydralazine]     Headaches   . Spironolactone     Hyperkalemia   . Hydralazine Hcl     Fuzzy headed   Prior to Admission medications   Medication Sig Start Date End Date Taking? Authorizing Provider  atorvastatin (LIPITOR) 40 MG tablet Take 1 tablet (40 mg total) by mouth at bedtime. 12/13/13  Yes Shaune Pascal Bensimhon, MD  carvedilol (COREG) 3.125 MG tablet TAKE 1 TABLET (3.125 MG TOTAL) BY MOUTH 2 (TWO) TIMES DAILY WITH A MEAL. 09/09/14  Yes Amy D Clegg, NP  digoxin (LANOXIN) 0.25 MG tablet TAKE 1/2 TAB (0.125 MG TOTAL) BY MOUTH DAILY. 09/17/14  Yes Amy D Clegg, NP  Febuxostat (ULORIC) 80 MG TABS Take 80 mg by mouth at bedtime.   Yes Historical Provider, MD  ivabradine (CORLANOR) 5 MG TABS tablet Take 1 tablet (5 mg total) by mouth 2 (two) times daily with a meal. 10/31/14  Yes Larey Dresser, MD  losartan (COZAAR) 25 MG tablet TAKE 1 TABLET (25 MG TOTAL) BY MOUTH DAILY. 09/09/14  Yes  Jolaine Artist, MD  magnesium oxide (MAG-OX) 400 MG tablet Take 400 mg by mouth 2 (two) times daily.    Yes Historical Provider, MD  pregabalin (LYRICA) 150 MG capsule Take 1 capsule (150 mg total) by mouth at bedtime. 10/31/14  Yes Jolaine Artist, MD  tobramycin-dexamethasone [facility]) ophthalmic solution Place 2 drops into the right eye 2 (two) times daily. 12/09/13  Yes Historical Provider, MD  torsemide [facility])  10 MG tablet Take 10mg  tablet once daily.  On Wednesday and Saturdays, take extra tablet (20mg  total). 05/14/14  Yes Amy D Clegg, NP     ROS: The patient has joint swelling (knee), fever.   All other systems have been reviewed and were otherwise negative with the exception of those mentioned in the HPI and as above.    PHYSICAL EXAM: Filed Vitals:   12/14/14 1518  BP: 112/78  Pulse: 116  Temp: 100.5 F (38.1 C)  Resp: 18   Body mass index is 31.9 kg/(m^2).   General: Alert, no acute distress HEENT:  Normocephalic, atraumatic, oropharynx patent. Eye: Juliette Mangle [facility] Cardiovascular:  Regular rate and rhythm, no rubs murmurs or gallops.  No Carotid bruits, radial pulse intact. No pedal edema.  Respiratory: Clear to auscultation bilaterally.  No wheezes, rales, or rhonchi.  No cyanosis, no use of accessory musculature Abdominal: No organomegaly, abdomen is soft and non-tender, positive bowel sounds.  No masses. Musculoskeletal: Gait intact. Large left knee effusion with dec range of motion.  Skin: No rashes. Neurologic: Facial musculature symmetric. Psychiatric: Patient acts appropriately throughout our interaction. Lymphatic: No cervical or submandibular lymphadenopathy   Procedure notes: Superior lateral portion of the knee prepared with beta dime X3, swapped with alcohol, numbed with 1.5 cc 2% plain. 20 gauge needle used with removal of 60 cc of fluid, syringe changed, and another 20 cc of fluid was drained. Pt tolerated pain well, and fluid was sent for analysis.    LABS:    EKG/XRAY:   Primary read interpreted by Dr. Everlene Farrier at [facility].   ASSESSMENT/PLAN: I suspect this is gout. I put him on a prednisone taper 40 4040 3030 3020 2020 12/14/2008. Fluid was sent for analysis and culture. By signing my name below, I, Rawaa Al Rifaie, attest that this documentation has been prepared under the direction and in the presence of Jamie Queen, MD.  Leandra Kern, Medical Scribe. 12/14/2014.  4:13 PM.    Gross sideeffects, risk and benefits, and alternatives of medications d/w patient. Patient is aware that all medications have potential sideeffects and we are unable to predict every sideeffect or drug-drug interaction that may occur.  Jamie Queen MD 12/14/2014 3:53 PM

## 2014-12-14 NOTE — Patient Instructions (Signed)

## 2014-12-15 LAB — SYNOVIAL CELL COUNT + DIFF, W/ CRYSTALS
EOSINOPHILS-SYNOVIAL: 0 % (ref 0–1)
Lymphocytes-Synovial Fld: 4 % (ref 0–20)
Monocyte/Macrophage: 0 % — ABNORMAL LOW (ref 50–90)
Neutrophil, Synovial: 95 % — ABNORMAL HIGH (ref 0–25)
WBC, Synovial: 265 cu mm — ABNORMAL HIGH (ref 0–200)

## 2014-12-18 LAB — BODY FLUID CULTURE
GRAM STAIN: NONE SEEN
ORGANISM ID, BACTERIA: NO GROWTH

## 2014-12-29 ENCOUNTER — Telehealth: Payer: Self-pay | Admitting: Internal Medicine

## 2014-12-29 NOTE — Telephone Encounter (Signed)
Patient states that he wants to have an EMG nerve conduction study performed near his diaphragm.   Spoke with [employer] tech support about possible interference with device. [employer] tech support states that if they do a manual stimulation spaced at least 10 seconds apart there shouldn't be any interference, but if it's an automatic stimulation then a magnet may need to be applied.

## 2014-12-29 NOTE — Telephone Encounter (Signed)
Informed patient that rep needs to be present. Patient to call back w/ appt time so rep can be notified.sss

## 2014-12-29 NOTE — Telephone Encounter (Signed)
New problem   Pt want to know if he can have a nerve conduction study if he has a defib. Please call pt.

## 2015-01-07 ENCOUNTER — Other Ambulatory Visit: Payer: Self-pay | Admitting: Adult Health

## 2015-01-10 ENCOUNTER — Other Ambulatory Visit (HOSPITAL_COMMUNITY): Payer: Self-pay | Admitting: Internal Medicine

## 2015-01-12 ENCOUNTER — Ambulatory Visit: Payer: 59 | Admitting: Pulmonary Disease

## 2015-01-17 ENCOUNTER — Other Ambulatory Visit (HOSPITAL_COMMUNITY): Payer: Self-pay | Admitting: Adult Health

## 2015-01-20 ENCOUNTER — Other Ambulatory Visit (HOSPITAL_COMMUNITY): Payer: Self-pay | Admitting: Adult Health

## 2015-01-21 ENCOUNTER — Other Ambulatory Visit (HOSPITAL_COMMUNITY): Payer: Self-pay | Admitting: *Deleted

## 2015-01-21 MED ORDER — DIGOXIN 250 MCG PO TABS: ORAL_TABLET | ORAL | Status: DC

## 2015-01-24 IMAGING — CR DG CHEST 2V
2 series · 2 of 2 positions shown · non-contrast
Comparison: 09/17/2013

CLINICAL DATA: Biopsy of paratracheal mass.  Shortness of breath.

EXAM:
CHEST  2 VIEW

[w chest pa]
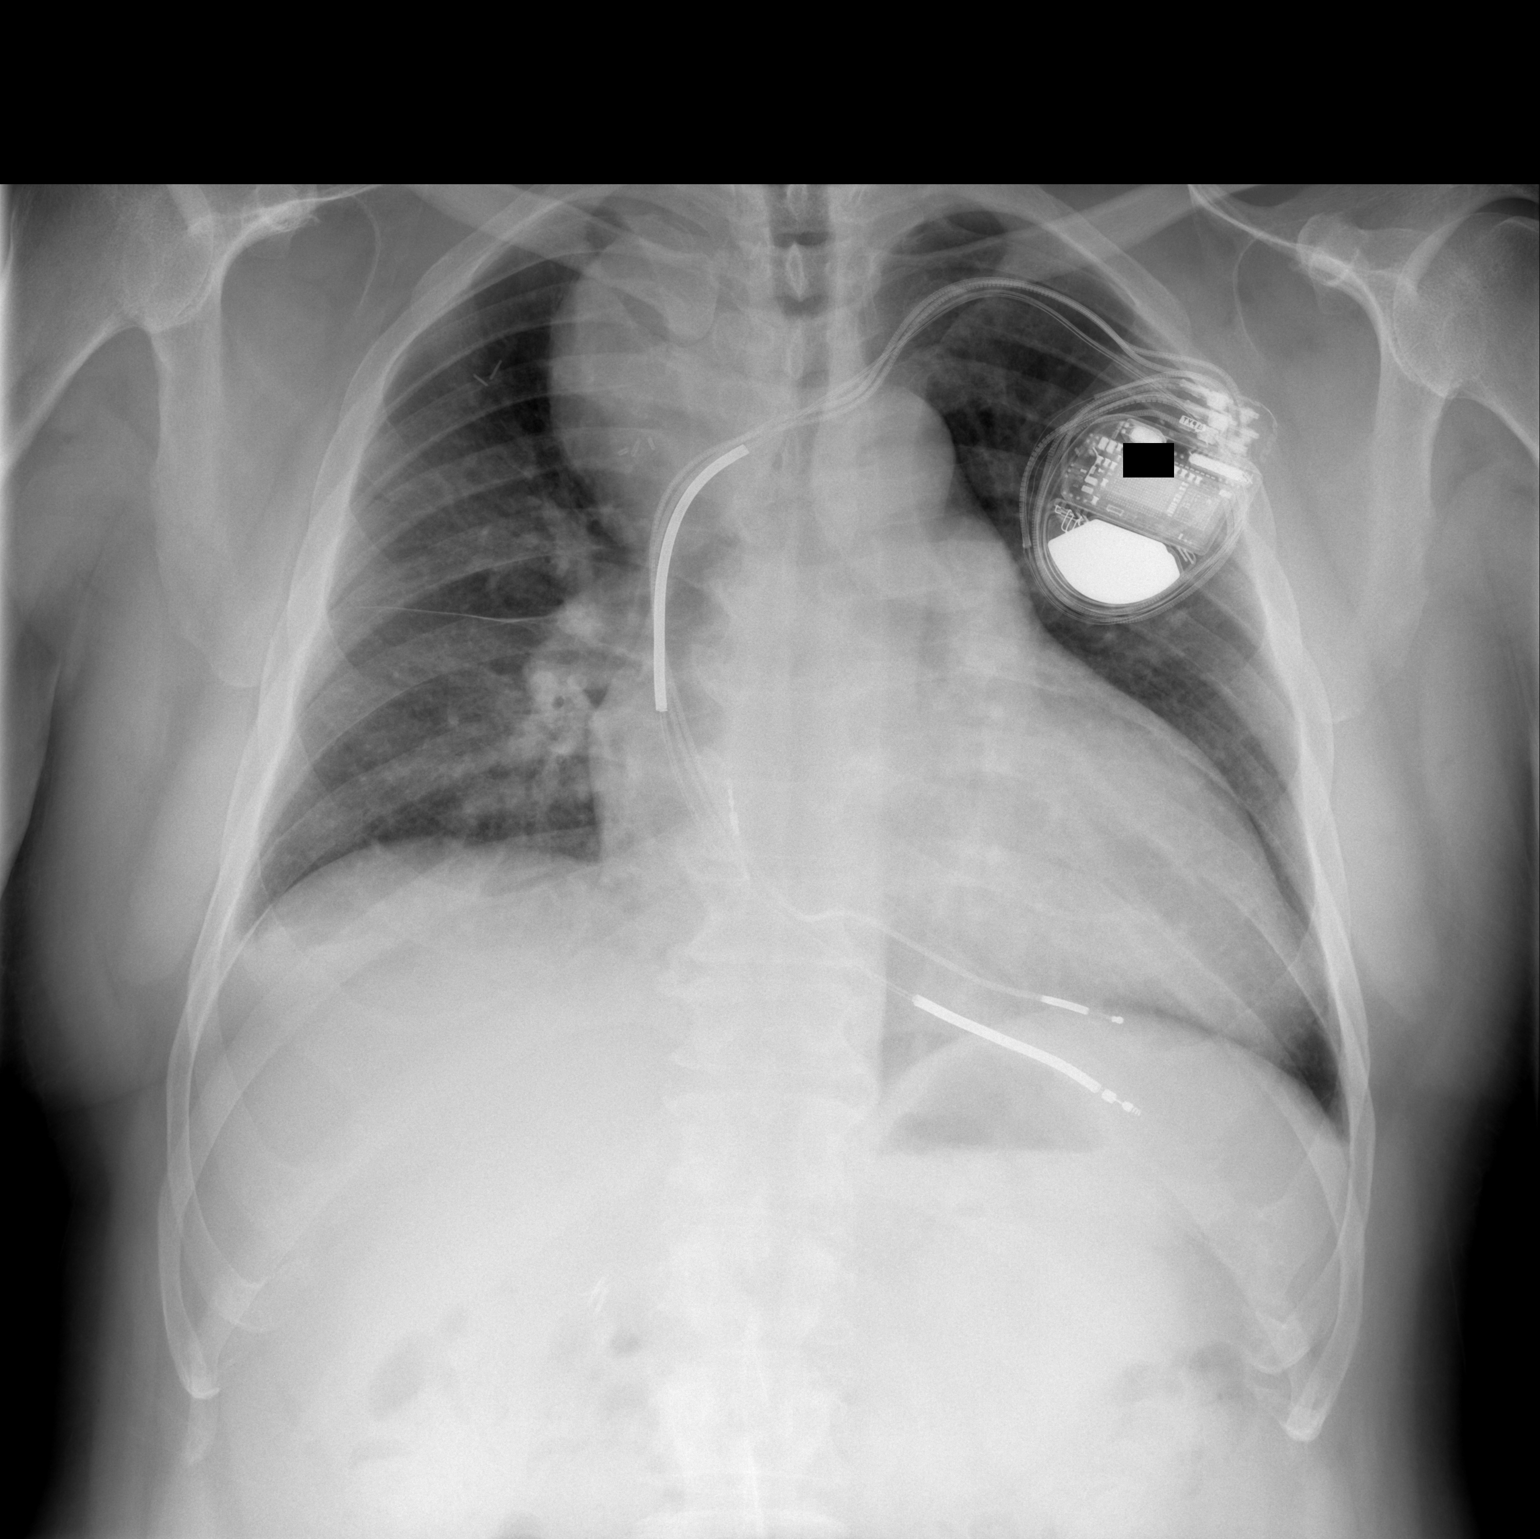

[w chest lat]
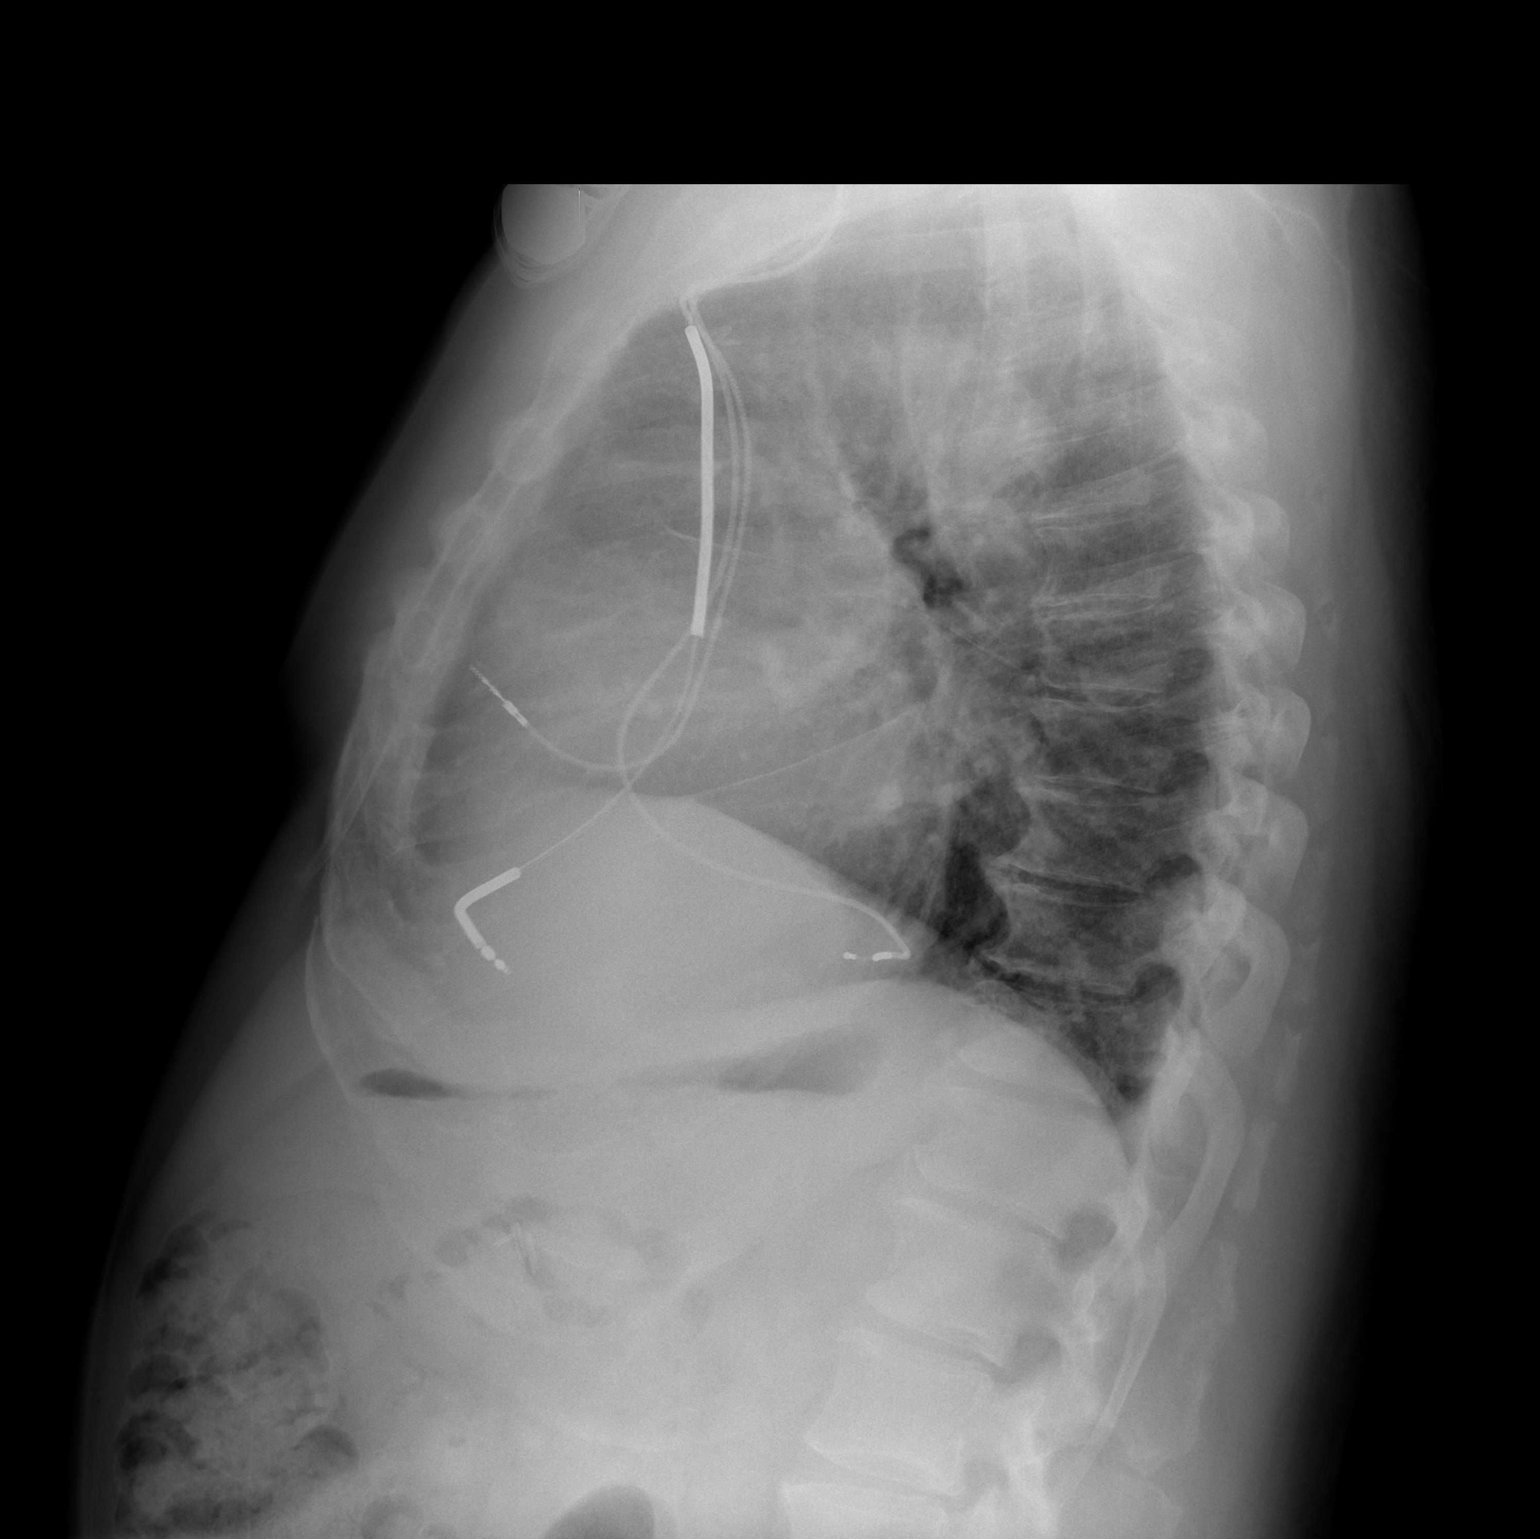

[2 of 2 positions shown; findings below may reference images not displayed]

FINDINGS: Right paratracheal mass 6.6 cm transverse with adjacent narrowing of
the thoracic trachea. Prior pneumothorax is no longer present.

Cardiomegaly noted.  AICD in place.  The lungs appear clear.

Thoracic spondylosis.
IMPRESSION: 1. No pneumothorax.  Right paratracheal mass noted.
2. Cardiomegaly.
3. Prior right pleural effusion has resolved.

## 2015-01-31 ENCOUNTER — Ambulatory Visit (INDEPENDENT_AMBULATORY_CARE_PROVIDER_SITE_OTHER): Payer: 59 | Admitting: Emergency Medicine

## 2015-01-31 ENCOUNTER — Observation Stay (HOSPITAL_COMMUNITY)
Admission: EM | Admit: 2015-01-31 | Discharge: 2015-02-01 | Disposition: A | Discharge status: Home / Self Care | Payer: 59 | Attending: Internal Medicine | Admitting: Internal Medicine

## 2015-01-31 ENCOUNTER — Emergency Department (HOSPITAL_COMMUNITY): Payer: 59

## 2015-01-31 VITALS — BP 134/80 | HR 100 | Temp 99.6°F | Resp 16 | Ht 70.5 in | Wt 233.0 lb

## 2015-01-31 DIAGNOSIS — R0602 Shortness of breath: Secondary | ICD-10-CM

## 2015-01-31 DIAGNOSIS — Z6832 Body mass index (BMI) 32.0-32.9, adult: Secondary | ICD-10-CM | POA: Diagnosis not present

## 2015-01-31 DIAGNOSIS — Z947 Corneal transplant status: Secondary | ICD-10-CM | POA: Insufficient documentation

## 2015-01-31 DIAGNOSIS — D72829 Elevated white blood cell count, unspecified: Secondary | ICD-10-CM | POA: Diagnosis not present

## 2015-01-31 DIAGNOSIS — M109 Gout, unspecified: Secondary | ICD-10-CM | POA: Diagnosis present

## 2015-01-31 DIAGNOSIS — I5042 Chronic combined systolic (congestive) and diastolic (congestive) heart failure: Secondary | ICD-10-CM | POA: Diagnosis not present

## 2015-01-31 DIAGNOSIS — Z9989 Dependence on other enabling machines and devices: Secondary | ICD-10-CM

## 2015-01-31 DIAGNOSIS — I10 Essential (primary) hypertension: Secondary | ICD-10-CM

## 2015-01-31 DIAGNOSIS — I429 Cardiomyopathy, unspecified: Secondary | ICD-10-CM | POA: Diagnosis not present

## 2015-01-31 DIAGNOSIS — I5043 Acute on chronic combined systolic (congestive) and diastolic (congestive) heart failure: Secondary | ICD-10-CM | POA: Diagnosis not present

## 2015-01-31 DIAGNOSIS — E669 Obesity, unspecified: Secondary | ICD-10-CM | POA: Insufficient documentation

## 2015-01-31 DIAGNOSIS — N183 Chronic kidney disease, stage 3 unspecified: Secondary | ICD-10-CM

## 2015-01-31 DIAGNOSIS — I472 Ventricular tachycardia: Secondary | ICD-10-CM | POA: Insufficient documentation

## 2015-01-31 DIAGNOSIS — Z79899 Other long term (current) drug therapy: Secondary | ICD-10-CM | POA: Insufficient documentation

## 2015-01-31 DIAGNOSIS — I130 Hypertensive heart and chronic kidney disease with heart failure and stage 1 through stage 4 chronic kidney disease, or unspecified chronic kidney disease: Secondary | ICD-10-CM | POA: Diagnosis not present

## 2015-01-31 DIAGNOSIS — Z95810 Presence of automatic (implantable) cardiac defibrillator: Secondary | ICD-10-CM | POA: Insufficient documentation

## 2015-01-31 DIAGNOSIS — E039 Hypothyroidism, unspecified: Secondary | ICD-10-CM | POA: Insufficient documentation

## 2015-01-31 DIAGNOSIS — I2510 Atherosclerotic heart disease of native coronary artery without angina pectoris: Secondary | ICD-10-CM | POA: Diagnosis not present

## 2015-01-31 DIAGNOSIS — M1000 Idiopathic gout, unspecified site: Secondary | ICD-10-CM | POA: Diagnosis not present

## 2015-01-31 DIAGNOSIS — E785 Hyperlipidemia, unspecified: Secondary | ICD-10-CM | POA: Insufficient documentation

## 2015-01-31 DIAGNOSIS — M10062 Idiopathic gout, left knee: Secondary | ICD-10-CM

## 2015-01-31 DIAGNOSIS — G4733 Obstructive sleep apnea (adult) (pediatric): Secondary | ICD-10-CM | POA: Diagnosis not present

## 2015-01-31 DIAGNOSIS — J986 Disorders of diaphragm: Secondary | ICD-10-CM | POA: Diagnosis not present

## 2015-01-31 DIAGNOSIS — I509 Heart failure, unspecified: Secondary | ICD-10-CM

## 2015-01-31 DIAGNOSIS — N179 Acute kidney failure, unspecified: Secondary | ICD-10-CM | POA: Insufficient documentation

## 2015-01-31 DIAGNOSIS — G4730 Sleep apnea, unspecified: Secondary | ICD-10-CM | POA: Insufficient documentation

## 2015-01-31 LAB — DIGOXIN LEVEL: DIGOXIN LVL: 0.2 ng/mL — AB (ref 0.8–2.0)

## 2015-01-31 LAB — SYNOVIAL CELL COUNT + DIFF, W/ CRYSTALS
Eosinophils-Synovial: 0 % (ref 0–1)
LYMPHOCYTES-SYNOVIAL FLD: 1 % (ref 0–20)
Monocyte/Macrophage: 4 % — ABNORMAL LOW (ref 50–90)
NEUTROPHIL, SYNOVIAL: 95 % — AB (ref 0–25)
WBC, SYNOVIAL: 64420 uL — AB (ref 0–200)

## 2015-01-31 LAB — BASIC METABOLIC PANEL
ANION GAP: 8 (ref 5–15)
BUN: 22 mg/dL — ABNORMAL HIGH (ref 6–20)
CHLORIDE: 102 mmol/L (ref 101–111)
CO2: 26 mmol/L (ref 22–32)
Calcium: 9.3 mg/dL (ref 8.9–10.3)
Creatinine, Ser: 1.6 mg/dL — ABNORMAL HIGH (ref 0.61–1.24)
GFR calc Af Amer: 54 mL/min — ABNORMAL LOW (ref >60)
GFR, EST NON AFRICAN AMERICAN: 47 mL/min — AB (ref >60)
GLUCOSE: 105 mg/dL — AB (ref 65–99)
POTASSIUM: 4.9 mmol/L (ref 3.5–5.1)
Sodium: 136 mmol/L (ref 135–145)

## 2015-01-31 LAB — CBC WITH DIFFERENTIAL/PLATELET
BASOS ABS: 0 10*3/uL (ref 0.0–0.1)
Basophils Relative: 0 %
Eosinophils Absolute: 0 10*3/uL (ref 0.0–0.7)
Eosinophils Relative: 0 %
HEMATOCRIT: 48.8 % (ref 39.0–52.0)
HEMOGLOBIN: 16.4 g/dL (ref 13.0–17.0)
LYMPHS PCT: 10 %
Lymphs Abs: 1.5 10*3/uL (ref 0.7–4.0)
MCH: 29.9 pg (ref 26.0–34.0)
MCHC: 33.6 g/dL (ref 30.0–36.0)
MCV: 89.1 fL (ref 78.0–100.0)
Monocytes Absolute: 2.5 10*3/uL — ABNORMAL HIGH (ref 0.1–1.0)
Monocytes Relative: 18 %
NEUTROS ABS: 10.2 10*3/uL — AB (ref 1.7–7.7)
Neutrophils Relative %: 72 %
Platelets: 203 10*3/uL (ref 150–400)
RBC: 5.48 MIL/uL (ref 4.22–5.81)
RDW: 15 % (ref 11.5–15.5)
WBC: 14.2 10*3/uL — AB (ref 4.0–10.5)

## 2015-01-31 LAB — BRAIN NATRIURETIC PEPTIDE: B Natriuretic Peptide: 612 pg/mL — ABNORMAL HIGH (ref 0.0–100.0)

## 2015-01-31 LAB — I-STAT TROPONIN, ED: Troponin i, poc: 0 ng/mL (ref 0.00–0.08)

## 2015-01-31 MED ORDER — IVABRADINE HCL 5 MG PO TABS: 5.0000 mg | ORAL_TABLET | Freq: Two times a day (BID) | ORAL | Status: DC

## 2015-01-31 MED ORDER — CARVEDILOL 3.125 MG PO TABS
3.1250 mg | ORAL_TABLET | Freq: Two times a day (BID) | ORAL | Status: DC
  Administered 2015-02-01: 3.125 mg via ORAL
  Filled 2015-01-31: qty 1

## 2015-01-31 MED ORDER — SODIUM CHLORIDE 0.9 % IJ SOLN
3.0000 mL | Freq: Two times a day (BID) | INTRAMUSCULAR | Status: DC
Start: 2015-01-31 — End: 2015-02-01
  Administered 2015-01-31 – 2015-02-01 (×2): 3 mL via INTRAVENOUS

## 2015-01-31 MED ORDER — ACETAMINOPHEN 325 MG PO TABS: 650.0000 mg | ORAL_TABLET | ORAL | Status: DC | PRN

## 2015-01-31 MED ORDER — SODIUM CHLORIDE 0.9 % IJ SOLN: 3.0000 mL | INTRAMUSCULAR | Status: DC | PRN

## 2015-01-31 MED ORDER — METHYLPREDNISOLONE SODIUM SUCC 125 MG IJ SOLR
60.0000 mg | Freq: Once | INTRAMUSCULAR | Status: AC
  Administered 2015-01-31: 60 mg via INTRAVENOUS
  Filled 2015-01-31: qty 2

## 2015-01-31 MED ORDER — FLUTICASONE FUROATE-VILANTEROL 100-25 MCG/INH IN AEPB: 1.0000 | INHALATION_SPRAY | Freq: Every day | RESPIRATORY_TRACT | Status: DC

## 2015-01-31 MED ORDER — ONDANSETRON HCL 4 MG/2ML IJ SOLN: 4.0000 mg | Freq: Four times a day (QID) | INTRAMUSCULAR | Status: DC | PRN

## 2015-01-31 MED ORDER — TOBRAMYCIN-DEXAMETHASONE 0.3-0.1 % OP SUSP
2.0000 [drp] | Freq: Two times a day (BID) | OPHTHALMIC | Status: DC
  Administered 2015-01-31 – 2015-02-01 (×2): 2 [drp] via OPHTHALMIC
  Filled 2015-01-31: qty 2.5

## 2015-01-31 MED ORDER — ATORVASTATIN CALCIUM 40 MG PO TABS
40.0000 mg | ORAL_TABLET | Freq: Every day | ORAL | Status: DC
  Administered 2015-01-31: 40 mg via ORAL
  Filled 2015-01-31: qty 1

## 2015-01-31 MED ORDER — HYDROCODONE-ACETAMINOPHEN 5-325 MG PO TABS
1.0000 | ORAL_TABLET | Freq: Four times a day (QID) | ORAL | Status: DC | PRN
  Administered 2015-01-31 – 2015-02-01 (×2): 1 via ORAL
  Filled 2015-01-31 (×2): qty 1

## 2015-01-31 MED ORDER — LOSARTAN POTASSIUM 25 MG PO TABS
25.0000 mg | ORAL_TABLET | Freq: Every day | ORAL | Status: DC
  Administered 2015-02-01: 25 mg via ORAL
  Filled 2015-01-31: qty 1

## 2015-01-31 MED ORDER — FEBUXOSTAT 40 MG PO TABS
80.0000 mg | ORAL_TABLET | Freq: Every day | ORAL | Status: DC
  Administered 2015-01-31: 80 mg via ORAL
  Filled 2015-01-31 (×2): qty 2

## 2015-01-31 MED ORDER — DIGOXIN 250 MCG PO TABS
0.2500 mg | ORAL_TABLET | Freq: Every day | ORAL | Status: DC
  Administered 2015-02-01: 0.25 mg via ORAL
  Filled 2015-01-31: qty 1

## 2015-01-31 MED ORDER — SODIUM CHLORIDE 0.9 % IV SOLN: 250.0000 mL | INTRAVENOUS | Status: DC | PRN

## 2015-01-31 MED ORDER — ENOXAPARIN SODIUM 40 MG/0.4ML ~~LOC~~ SOLN
40.0000 mg | Freq: Every day | SUBCUTANEOUS | Status: DC
  Administered 2015-01-31: 40 mg via SUBCUTANEOUS
  Filled 2015-01-31: qty 0.4

## 2015-01-31 MED ORDER — MAGNESIUM OXIDE 400 (241.3 MG) MG PO TABS
400.0000 mg | ORAL_TABLET | Freq: Two times a day (BID) | ORAL | Status: DC
  Administered 2015-01-31 – 2015-02-01 (×2): 400 mg via ORAL
  Filled 2015-01-31 (×2): qty 1

## 2015-01-31 MED ORDER — COLCHICINE 0.6 MG PO TABS
0.6000 mg | ORAL_TABLET | Freq: Every day | ORAL | Status: DC
  Administered 2015-01-31 – 2015-02-01 (×2): 0.6 mg via ORAL
  Filled 2015-01-31 (×2): qty 1

## 2015-01-31 MED ORDER — FUROSEMIDE 10 MG/ML IJ SOLN
40.0000 mg | Freq: Once | INTRAMUSCULAR | Status: AC
  Administered 2015-02-01: 40 mg via INTRAVENOUS
  Filled 2015-01-31: qty 4

## 2015-01-31 MED ORDER — MORPHINE SULFATE (PF) 2 MG/ML IV SOLN: 1.0000 mg | INTRAVENOUS | Status: DC | PRN

## 2015-01-31 MED ORDER — FUROSEMIDE 10 MG/ML IJ SOLN
40.0000 mg | Freq: Once | INTRAMUSCULAR | Status: AC
  Administered 2015-01-31: 40 mg via INTRAVENOUS
  Filled 2015-01-31: qty 4

## 2015-01-31 NOTE — Progress Notes (Signed)
Patient ID: [name], male   DOB: [birth year], 54 y.o.   MRN: [id number]    This chart was scribed for Jamie Jordan, MD by [facility], medical scribe at [facility].The patient was seen in exam room 11 and the patient's care was started at 3:05 PM.  Chief Complaint:  Chief Complaint  Patient presents with  . Gout    Left knee    HPI: [name] is a 54 y.o. male with a history of gout who reports to [facility] today complaining of a gout flare up in his left knee. He was fine until after Thanksgiving. Left knee was worse this morning.   Claims breathing has not worsened. He has always been SOB with activity. Feels a pain when breathing on the right side of his back. 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 Scheduled surgery at [facility] to adjust the diaphragm. He has had constant sinus drainage. No sore throat, or cough. Lives with wife and son. He has not been taking his diuretic pill due to his recent gout flare.  Working for [employer] the past 14 years.  Past Medical History  Diagnosis Date  . Gout 08/03/12    PT C/O OF RIGHT KNEE PAIN AND SWELLING -STATES GOUT FLARE UP - ONGOING SINCE APRIL - BUT SWELLING W/IN LAST WEEK  . Cardiomyopathy     Idiopathic dilated;   Marland Kitchen Ventricular tachycardia ([place])     s/p ICD  . Sleep apnea     CPAP  . Systolic CHF ([place])     EF 15%  . CKD (chronic kidney disease)     stage III baseline Crt 1.8-2.0  . Biventricular ICD (implantable cardiac defibrillator) Medtronic ]     DOI 2008/ upgrade 2010/ Gen Change 2014  . Automatic implantable cardioverter-defibrillator in situ   . CHF (congestive heart failure) ([place])   . Bladder tumor     PT HOSP AT [facility] 4/24 TO 4/26 2014 WITH UTI--AND FOUND TO HAVE BLADDER TUMOR-  . Arthritis     GOUT  . Hilar mass     Noted CT 2010  . HTN (hypertension)     Dr. Caryl Comes cardiologist  . Coronary artery disease   . Hypothyroidism   . 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 Cholecystectomy    . Cardiac catheterization      he was found to have normal coronary arteries but with a globally dilated and hypocontractile heart  . US echocardiography  03-17-2008, 07-03-2006    EF 15-20%, EF 15-20%  . Transurethral resection of bladder tumor N/A 08/13/2012    Procedure: TRANSURETHRAL RESECTION OF BLADDER TUMOR (TURBT);  Surgeon: Claybon Jabs, MD;  [facility];  Service: Urology;  Laterality: N/A;  MITOMYCIN C  . Cardiac defibrillator placement      DOI 2008/ upgrade 2010/ Gen Change 2014   . Endobronchial ultrasound Bilateral 10/01/2012    Procedure: ENDOBRONCHIAL ULTRASOUND;  Surgeon: Collene Gobble, MD;  [facility];  Service: Cardiopulmonary;  Laterality: Bilateral;  . Cornea replacement    . Video bronchoscopy N/A 10/25/2012    Procedure: VIDEO BRONCHOSCOPY;  Surgeon: [name], MD;  Location: [place];  Service: Thoracic;  Laterality: N/A;  . Mediastinoscopy N/A 10/25/2012    Procedure: MEDIASTINOSCOPY;  Surgeon: [name], MD;  Location: [place];  Service:  Thoracic;  Laterality: N/A;  . Tonsillectomy    . Mediastinotomy  09/10/2013    paratracheal mass    DR VANTRIGT  . Chest tube insertion  09/10/2013  . Mediastinotomy chamberlain mcneil Right 09/10/2013    Procedure: MEDIASTINOTOMY CHAMBERLAIN MCNEIL;  Surgeon: [name], MD;  Location: [place];  Service: Thoracic;  Laterality: Right;  . Biv icd genertaor change out      DOI 2008/ upgrade 2010/ Gen Change 2014   . Right heart catheterization N/A 01/27/2012    Procedure: RIGHT HEART CATH;  Surgeon: Jolaine Artist, MD;  Location: [place] CATH LAB;  Service: Cardiovascular;  Laterality: N/A;  . Implantable cardioverter defibrillator (icd) generator change N/A 05/02/2012    Procedure: ICD GENERATOR CHANGE;  Surgeon: Deboraha Sprang, MD;  Location: [facility] CATH LAB;  Service:  Cardiovascular;  Laterality: N/A;  . Video assisted thoracoscopy (vats)/thorocotomy Right 02/13/2014    Procedure: VIDEO ASSISTED THORACOSCOPY (VATS)/THOROCOTOMY;  Surgeon: [name], MD;  [facility];  Service: Thoracic;  Laterality: Right;  PATIENT NEEDS EPIDURAL CATHETER (DR. CHRIS MOSER AWARE PER PVT)  . Resection of mediastinal mass Right 02/13/2014    Procedure: RESECTION OF MEDIASTINAL MASS;  Surgeon: [name], MD;  Location: [facility] OR;  Service: Thoracic;  Laterality: Right;  PATIENT NEEDS EPIDURAL CATHETER AND A SWAN GANZ CATHETER (DR. CHRIS MOSER AWARE PER PVT)   Social History   Social History  . Marital Status: Married    Spouse Name: N/A  . Number of Children: 2  . Years of Education: N/A   Occupational History  . [employer]   Social History Main Topics  . Smoking status: Never Smoker   . Smokeless tobacco: Never Used  . Alcohol Use: No  . Drug Use: No  . Sexual Activity: No   Other Topics Concern  . None   Social History Narrative   No family history on file. Allergies  Allergen Reactions  . Bidil [Isosorb Dinitrate-Hydralazine]     Headaches   . Spironolactone     Hyperkalemia   . Hydralazine Hcl     Fuzzy headed   Prior to Admission medications   Medication Sig Start Date End Date Taking? Authorizing Provider  atorvastatin (LIPITOR) 40 MG tablet TAKE 1 TABLET (40 MG TOTAL) BY MOUTH AT BEDTIME. 01/12/15  Yes Shaune Pascal Bensimhon, MD  carvedilol (COREG) 3.125 MG tablet TAKE 1 TABLET (3.125 MG TOTAL) BY MOUTH 2 (TWO) TIMES DAILY WITH A MEAL. 01/07/15  Yes Amy D Clegg, NP  digoxin (LANOXIN) 0.25 MG tablet TAKE 1/2 TAB (0.125 MG TOTAL) BY MOUTH DAILY. 01/21/15  Yes Amy D Clegg, NP  Febuxostat (ULORIC) 80 MG TABS Take 80 mg by mouth at bedtime.   Yes Historical Provider, MD  ivabradine (CORLANOR) 5 MG TABS tablet Take 1 tablet (5 mg total) by mouth 2 (two) times daily with a meal. 10/31/14  Yes Larey Dresser, MD  losartan (COZAAR) 25  MG tablet TAKE 1 TABLET (25 MG TOTAL) BY MOUTH DAILY. 09/09/14  Yes Jolaine Artist, MD  magnesium oxide (MAG-OX) 400 MG tablet Take 400 mg by mouth 2 (two) times daily.    Yes Historical Provider, MD  predniSONE (DELTASONE) 10 MG tablet Take 4 a  day for 3 days 3 a day for 3 days 2 a day for 3 days one a day for 3 days 12/14/14  Yes Darlyne Russian, MD  pregabalin (LYRICA) 150 MG capsule Take 1 capsule (150  mg total) by mouth at bedtime. 10/31/14  Yes Jolaine Artist, MD  tobramycin-dexamethasone [facility]) ophthalmic solution Place 2 drops into the right eye 2 (two) times daily. 12/09/13  Yes Historical Provider, MD  torsemide (DEMADEX) 10 MG tablet TAKE 10MG  TABLET ONCE DAILY. ON WEDNESDAY AND SATURDAYS, TAKE EXTRA TABLET (20MG  TOTAL). 01/19/15  Yes Amy D Clegg, NP   ROS: The patient denies fevers, chills, night sweats, unintentional weight loss, chest pain, palpitations, wheezing, dyspnea on exertion, nausea, vomiting, abdominal pain, dysuria, hematuria, melena, numbness, weakness, or tingling.  All other systems have been reviewed and were otherwise negative with the exception of those mentioned in the HPI and as above.    PHYSICAL EXAM: Filed Vitals:   01/31/15 1501  BP: 134/80  Pulse: 100  Temp: 99.6 F (37.6 C)  Resp: 16   Body mass index is 32.95 kg/(m^2).   Wt Readings from Last 3 Encounters:  01/31/15 233 lb (105.688 kg)  12/14/14 225 lb 9.6 oz (102.331 kg)  10/31/14 225 lb (102.059 kg)    General: Alert, no acute distress, obvious tachypnea and forehead diaphoreis HEENT:  Normocephalic, atraumatic, oropharynx patent. Eye: Juliette Mangle [facility] Cardiovascular:  Regular rate, no rubs murmurs or gallops.  No Carotid bruits, radial pulse intact. No pedal edema. Irregular rhythm, questionable gallop Respiratory: Clear to auscultation bilaterally.  No wheezes, rales, or rhonchi.  No cyanosis, no use of accessory musculature Abdominal: No organomegaly, abdomen is soft and non-tender,  positive bowel sounds.  No masses. Decreased breath sounds in the right base but no definite wheezes Musculoskeletal: Gait intact. No ankle edema, tenderness. Sig effusion of the left knee.  Skin: No rashes. Neurologic: Facial musculature symmetric. Psychiatric: Patient acts appropriately throughout our interaction. Lymphatic: No cervical or submandibular lymphadenopathy Genitourinary/Anorectal: No acute findings  Procedure note: Lateral upper left knee prepped with betadine 2, swapped with alcohol numbed with 2 CC plain, 20 gauge needle used with drainage of 55 cc cloudy yellow fluid. Pt tolerated procedure well. Fluid sent for analysis.    EKG/XRAY:   Primary read interpreted by Dr. [facility]lene Farrier at [facility].  ASSESSMENT/PLAN: Left knee fluid was aspirated sent for analysis. pt was extremely tachypneic. There is a documented 8 pound weight gain since last visit. He has not taken is diuretic because of his gout flare. SOB and dyspnea probably  secondary to his cardiomyopathy. He has an implanted defib and hx V-tach. sent to the hospital to rule out worsened heart failure.  Gross sideeffects, risk and benefits, and alternatives of medications d/w patient. Patient is aware that all medications have potential sideeffects and we are unable to predict every sideeffect or drug-drug interaction that may occur.  By signing my name below, I, Nadim Abuhashem, attest that this documentation has been prepared under the direction and in the presence of Jamie Jordan, MD.  Electronically Signed: Lora Havens, medical scribe. 01/31/2015, 3:05 PM.  Arlyss Queen MD 01/31/2015 3:05 PM

## 2015-01-31 NOTE — ED Notes (Signed)
Patient transported to X-ray 

## 2015-01-31 NOTE — ED Notes (Signed)
Per EMS pt from urgent care; pt went for co gout and left knee pain; Dr pulled 50ccs fluid from knee after which pt became tachypneic; pt hx CHF, V-tach; high cholesterol; and defribrillator Ems reports hearing diminished lung sounds in lower right lobe; pt presents with O2 nasal  Cannula 2 liters for comfort; pt AXOX4

## 2015-01-31 NOTE — ED Provider Notes (Signed)
CSN: [id number]     Arrival date & time 01/31/15  1608 History   First MD Initiated Contact with Patient 01/31/15 1619     Chief Complaint  Patient presents with  . Shortness of Breath     (Consider location/radiation/quality/duration/timing/severity/associated sxs/prior Treatment) Patient is a 54 y.o. male presenting with shortness of breath. The history is provided by the patient and medical records.  Shortness of Breath   54 year old male with history of cardiomyopathy, chronic kidney disease, congestive heart failure, gouty arthritis, hypertension, hypothyroidism, presenting to the ED for shortness of breath. Patient states he went to urgent care to see Dr. Everlene Farrier for left knee pain secondary to gout.  He had an arthrocentesis done and sent for culture but became tachypneic after procedure. He was sent here for further evaluation of his SOB.  Patient states he usually takes Demadex, however has not taken this for the past 4 days. He states when he began having increased gout pain he stopped his Lasix as he notes in the past this has made his gout worse. He states he does have issues with chronic nasal congestion and postnasal drip. He denies any cough. Denies chest pain. Patient does not require home oxygen, he does use CPAP nightly due to sleep apnea. Patient is followed by cardiology, Dr. Haroldine Laws.  Weight at urgent care was noted to be up 8lbs from patient's baseline.  VSS.  Past Medical History  Diagnosis Date  . Gout 08/03/12    PT C/O OF RIGHT KNEE PAIN AND SWELLING -STATES GOUT FLARE UP - ONGOING SINCE APRIL - BUT SWELLING W/IN LAST WEEK  . Cardiomyopathy     Idiopathic dilated;   Marland Kitchen Ventricular tachycardia ([place])     s/p ICD  . Sleep apnea     CPAP  . Systolic CHF ([place])     EF 15%  . CKD (chronic kidney disease)     stage III baseline Crt 1.8-2.0  . Biventricular ICD (implantable cardiac defibrillator) Medtronic ]     DOI 2008/ upgrade 2010/ Gen Change 2014  . Automatic  implantable cardioverter-defibrillator in situ   . CHF (congestive heart failure) ([place])   . Bladder tumor     PT HOSP AT [facility] 4/24 TO 4/26 2014 WITH UTI--AND FOUND TO HAVE BLADDER TUMOR-  . Arthritis     GOUT  . Hilar mass     Noted CT 2010  . HTN (hypertension)     Dr. Caryl Comes cardiologist  . Coronary artery disease   . Hypothyroidism   . 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 Cholecystectomy    . Cardiac catheterization      he was found to have normal coronary arteries but with a globally dilated and hypocontractile heart  . US echocardiography  03-17-2008, 07-03-2006    EF 15-20%, EF 15-20%  . Transurethral resection of bladder tumor N/A 08/13/2012    Procedure: TRANSURETHRAL RESECTION OF BLADDER TUMOR (TURBT);  Surgeon: Claybon Jabs, MD;  [facility];  Service: Urology;  Laterality: N/A;  MITOMYCIN C  . Cardiac defibrillator placement      DOI 2008/ upgrade 2010/ Gen Change 2014   . Endobronchial ultrasound Bilateral 10/01/2012    Procedure: ENDOBRONCHIAL ULTRASOUND;  Surgeon: Collene Gobble, MD;  [facility];  Service: Cardiopulmonary;  Laterality: Bilateral;  . Cornea replacement    . Video bronchoscopy N/A 10/25/2012  Procedure: VIDEO BRONCHOSCOPY;  Surgeon: [name], MD;  Location: [place];  Service: Thoracic;  Laterality: N/A;  . Mediastinoscopy N/A 10/25/2012    Procedure: MEDIASTINOSCOPY;  Surgeon: [name], MD;  Location: [place];  Service: Thoracic;  Laterality: N/A;  . Tonsillectomy    . Mediastinotomy  09/10/2013    paratracheal mass    DR VANTRIGT  . Chest tube insertion  09/10/2013  . Mediastinotomy chamberlain mcneil Right 09/10/2013    Procedure: MEDIASTINOTOMY CHAMBERLAIN MCNEIL;  Surgeon: [name], MD;  Location: [place];  Service: Thoracic;  Laterality: Right;  . Biv icd genertaor change out      DOI 2008/ upgrade 2010/ Gen  Change 2014   . Right heart catheterization N/A 01/27/2012    Procedure: RIGHT HEART CATH;  Surgeon: Jolaine Artist, MD;  Location: [facility] CATH LAB;  Service: Cardiovascular;  Laterality: N/A;  . Implantable cardioverter defibrillator (icd) generator change N/A 05/02/2012    Procedure: ICD GENERATOR CHANGE;  Surgeon: Deboraha Sprang, MD;  Location: [facility] CATH LAB;  Service: Cardiovascular;  Laterality: N/A;  . Video assisted thoracoscopy (vats)/thorocotomy Right 02/13/2014    Procedure: VIDEO ASSISTED THORACOSCOPY (VATS)/THOROCOTOMY;  Surgeon: [name], MD;  [facility];  Service: Thoracic;  Laterality: Right;  PATIENT NEEDS EPIDURAL CATHETER (DR. CHRIS MOSER AWARE PER PVT)  . Resection of mediastinal mass Right 02/13/2014    Procedure: RESECTION OF MEDIASTINAL MASS;  Surgeon: [name], MD;  [facility];  Service: Thoracic;  Laterality: Right;  PATIENT NEEDS EPIDURAL CATHETER AND A SWAN GANZ CATHETER (DR. CHRIS MOSER AWARE PER PVT)   No family history on file. Social History  Substance Use Topics  . Smoking status: Never Smoker   . Smokeless tobacco: Never Used  . Alcohol Use: No    Review of Systems  HENT: Positive for congestion.   Respiratory: Positive for shortness of breath.   All other systems reviewed and are negative.     Allergies  Bidil; Spironolactone; and Hydralazine hcl  Home Medications   Prior to Admission medications   Medication Sig Start Date End Date Taking? Authorizing Provider  atorvastatin (LIPITOR) 40 MG tablet TAKE 1 TABLET (40 MG TOTAL) BY MOUTH AT BEDTIME. 01/12/15   Jolaine Artist, MD  carvedilol (COREG) 3.125 MG tablet TAKE 1 TABLET (3.125 MG TOTAL) BY MOUTH 2 (TWO) TIMES DAILY WITH A MEAL. 01/07/15   Amy D Ninfa Meeker, NP  digoxin (LANOXIN) 0.25 MG tablet TAKE 1/2 TAB (0.125 MG TOTAL) BY MOUTH DAILY. 01/21/15   Amy D Ninfa Meeker, NP  Febuxostat (ULORIC) 80 MG TABS Take 80 mg by mouth at bedtime.    Historical Provider, MD  ivabradine (CORLANOR)  5 MG TABS tablet Take 1 tablet (5 mg total) by mouth 2 (two) times daily with a meal. 10/31/14   Larey Dresser, MD  losartan (COZAAR) 25 MG tablet TAKE 1 TABLET (25 MG TOTAL) BY MOUTH DAILY. 09/09/14   Jolaine Artist, MD  magnesium oxide (MAG-OX) 400 MG tablet Take 400 mg by mouth 2 (two) times daily.     Historical Provider, MD  predniSONE (DELTASONE) 10 MG tablet Take 4 a  day for 3 days 3 a day for 3 days 2 a day for 3 days one a day for 3 days 12/14/14   Darlyne Russian, MD  pregabalin (LYRICA) 150 MG capsule Take 1 capsule (150 mg total) by mouth at bedtime. 10/31/14   Jolaine Artist, MD  tobramycin-dexamethasone (  TOBRADEX) ophthalmic solution Place 2 drops into the right eye 2 (two) times daily. 12/09/13   Historical Provider, MD  torsemide (DEMADEX) 10 MG tablet TAKE 10MG  TABLET ONCE DAILY. ON WEDNESDAY AND SATURDAYS, TAKE EXTRA TABLET (20MG  TOTAL). 01/19/15   Amy D Clegg, NP   BP 114/76 mmHg  Pulse 93  Temp(Src) 99.8 F (37.7 C) (Oral)  Resp 18  SpO2 98%   Physical Exam  Constitutional: He is oriented to person, place, and time. He appears well-developed and well-nourished. No distress.  HENT:  Head: Normocephalic and atraumatic.  Right Ear: Tympanic membrane and ear canal normal.  Left Ear: Tympanic membrane and ear canal normal.  Nose: Mucosal edema and rhinorrhea (clear) present.  Mouth/Throat: Uvula is midline, oropharynx is clear and moist and mucous membranes are normal. No oropharyngeal exudate, posterior oropharyngeal edema, posterior oropharyngeal erythema or tonsillar abscesses.  Clear rhinorrhea and PND noted  Eyes: Conjunctivae and EOM are normal. Pupils are equal, round, and reactive to light.  Neck: Normal range of motion. Neck supple.  Cardiovascular: Normal rate, regular rhythm and normal heart sounds.   Pulmonary/Chest: Effort normal and breath sounds normal. No respiratory distress. He has no wheezes.  Abdominal: Soft. Bowel sounds are normal. There is no  tenderness. There is no guarding.  Musculoskeletal: Normal range of motion.  No significant pitting edema noted  Neurological: He is alert and oriented to person, place, and time.  Skin: Skin is warm and dry. He is not diaphoretic.  Psychiatric: He has a normal mood and affect.  Nursing note and vitals reviewed.   ED Course  Procedures (including critical care time) Labs Review Labs Reviewed  CBC WITH DIFFERENTIAL/PLATELET - Abnormal; Notable for the following:    WBC 14.2 (*)    Neutro Abs 10.2 (*)    Monocytes Absolute 2.5 (*)    All other components within normal limits  BASIC METABOLIC PANEL - Abnormal; Notable for the following:    Glucose, Bld 105 (*)    BUN 22 (*)    Creatinine, Ser 1.60 (*)    GFR calc non Af Amer 47 (*)    GFR calc Af Amer 54 (*)    All other components within normal limits  BRAIN NATRIURETIC PEPTIDE - Abnormal; Notable for the following:    B Natriuretic Peptide 612.0 (*)    All other components within normal limits  DIGOXIN LEVEL - Abnormal; Notable for the following:    Digoxin Level 0.2 (*)    All other components within normal limits  I-STAT TROPOININ, ED    Imaging Review Dg Chest 2 View  01/31/2015  CLINICAL DATA:  Shortness of breath for 2-3 weeks. Initial encounter. EXAM: CHEST  2 VIEW COMPARISON:  CT chest 09/30/2014. PA and lateral chest 02/23/2015 and 05/22/2014. FINDINGS: There is cardiomegaly. Postoperative change in the right chest is identified with associated scar, stable in appearance. The left lung is clear. No pneumothorax or pleural effusion. AICD is unchanged in appearance. IMPRESSION: Cardiomegaly without acute disease. Electronically Signed   By: Inge Rise M.D.   On: 01/31/2015 17:03   I have personally reviewed and evaluated these images and lab results as part of my medical decision-making.   EKG Interpretation None      MDM   Final diagnoses:  Acute on chronic congestive heart failure, unspecified congestive  heart failure type (HCC)  Shortness of breath   54 year old male here with shortness of breath. He has been off of his Demadex for several days  as he states this makes his gout flare worse. Patient is afebrile, nontoxic in appearance. He is tachypneic without acute respiratory distress. He is able to speak in full sentences without difficulty. He is not requiring supplemental oxygen. Baseline weight is up 8 lbs.  Leukocytosis noted which appears chronic for patient when compared with prior values.  Renal function also at baseline for patient.  BNP is elevated at 612.  No frank pulmonary edema noted on chest x-ray.   7:36 PM Patient got out of bed to ambulate to the restroom, got very short of breath, increasingly tachypnea, and lightheaded. Upon returning to the room, he still remains tachypneic but is not requiring supplemental oxygen at this time.  VS remain stable.  Given patient's cardiac hx and ongoing symptoms, feel he would benefit from admission and diuresis.  This plan discussed with patient and wife, they acknowleged understanding and agreed.  Patient given dose of 40mg  IV lasix, admit to hospitalist service for further management.    Larene Pickett, PA-C 01/31/15 2127  Lacretia Leigh, MD 01/31/15 [phone]

## 2015-01-31 NOTE — ED Notes (Signed)
Bed being changed per Tiffany RN charge to med surg

## 2015-01-31 NOTE — ED Notes (Signed)
Bed: [id number] Expected date:  Expected time:  Means of arrival:  Comments: EMS- CHF

## 2015-01-31 NOTE — ED Notes (Signed)
Admitting MD at bedside,  Pt in NAD

## 2015-01-31 NOTE — H&P (Signed)
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 Patient notes that he had went in to urgent care for a acute gout flare of his left knee. During that time they drain fluid off of the knee. He reports that he stop his Lasix approximately 3 days ago prior to Thanksgiving because he felt his gout flaring up and his knee. At the visit he was noted to be rapidly breathing and they felt that he should be evaluated at the emergency department. Patient notes that he's had a paralyzed right hemi-diaphragm from the Paraganglioma removal surgery in12/2015. Patient has a appointment with the specialist on Monday for which he is trying to have this corrected. He reports that is likely the reason for his tachypnea. He is easily short of breath with any exertion since the procedure. He states that he is somewhat close to his baseline of breathing may be slightly worsened since not taking Lasix.  Upon arrival to the emergency department he was seen have elevated BNP of 612 with elevated white blood cell count 14.2. It was suggested that the patient come in for observation and diuresis.  Review of Systems  Constitutional: Positive for malaise/fatigue. Negative for weight loss.  HENT: Negative for ear pain and hearing loss.   Eyes: Positive for discharge and redness.  Respiratory: Positive for shortness of breath. Negative for cough.   Cardiovascular: Positive for leg swelling.  Gastrointestinal: Negative for abdominal pain  and blood in stool.  Genitourinary: Positive for hematuria.  Skin: Negative for itching and rash.  Neurological: Negative for tremors and speech change.  Endo/Heme/Allergies: Negative for polydipsia.  Psychiatric/Behavioral: Negative for hallucinations. The patient is not nervous/anxious.          Past Medical History  Diagnosis Date  . Gout 08/03/12    PT C/O OF RIGHT KNEE PAIN AND SWELLING -STATES GOUT FLARE UP - ONGOING SINCE APRIL - BUT SWELLING W/IN LAST WEEK  . Cardiomyopathy     Idiopathic dilated;   Marland Kitchen Ventricular tachycardia ([place])     s/p ICD  . Sleep apnea     CPAP  . Systolic CHF ([place])     EF 15%  . CKD (chronic kidney disease)     stage III baseline Crt 1.8-2.0  . Biventricular ICD (implantable cardiac defibrillator) [employer] ]     DOI 2008/ upgrade 2010/ Gen Change 2014  . Automatic implantable cardioverter-defibrillator in situ   . CHF (congestive heart failure) ([place])   . Bladder tumor     PT HOSP AT [facility] 4/24 TO 4/26 2014 WITH UTI--AND FOUND TO HAVE BLADDER TUMOR-  . Arthritis     GOUT  . Hilar mass     Noted CT 2010  . HTN (hypertension)     Dr. Caryl Comes cardiologist  . Coronary artery disease   . Hypothyroidism   . 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 Cholecystectomy    . Cardiac catheterization  he was found to have normal coronary arteries but with a globally dilated and hypocontractile heart  . US echocardiography  03-17-2008, 07-03-2006    EF 15-20%, EF 15-20%  . Transurethral resection of bladder tumor N/A 08/13/2012    Procedure: TRANSURETHRAL RESECTION OF BLADDER TUMOR (TURBT);  Surgeon: Claybon Jabs, MD;  Location: [facility] ORS;  Service: Urology;  Laterality: N/A;  MITOMYCIN C  . Cardiac defibrillator placement      DOI 2008/ upgrade 2010/ Gen Change 2014   . Endobronchial ultrasound Bilateral 10/01/2012    Procedure: ENDOBRONCHIAL  ULTRASOUND;  Surgeon: Collene Gobble, MD;  Location: [facility] ENDOSCOPY;  Service: Cardiopulmonary;  Laterality: Bilateral;  . Cornea replacement    . Video bronchoscopy N/A 10/25/2012    Procedure: VIDEO BRONCHOSCOPY;  Surgeon: [name], MD;  Location: [place];  Service: Thoracic;  Laterality: N/A;  . Mediastinoscopy N/A 10/25/2012    Procedure: MEDIASTINOSCOPY;  Surgeon: [name], MD;  Location: [place];  Service: Thoracic;  Laterality: N/A;  . Tonsillectomy    . Mediastinotomy  09/10/2013    paratracheal mass    DR VANTRIGT  . Chest tube insertion  09/10/2013  . Mediastinotomy chamberlain mcneil Right 09/10/2013    Procedure: MEDIASTINOTOMY CHAMBERLAIN MCNEIL;  Surgeon: [name], MD;  Location: [place];  Service: Thoracic;  Laterality: Right;  . Biv icd genertaor change out      DOI 2008/ upgrade 2010/ Gen Change 2014   . Right heart catheterization N/A 01/27/2012    Procedure: RIGHT HEART CATH;  Surgeon: Jolaine Artist, MD;  Location: [facility] CATH LAB;  Service: Cardiovascular;  Laterality: N/A;  . Implantable cardioverter defibrillator (icd) generator change N/A 05/02/2012    Procedure: ICD GENERATOR CHANGE;  Surgeon: Deboraha Sprang, MD;  Location: [facility] CATH LAB;  Service: Cardiovascular;  Laterality: N/A;  . Video assisted thoracoscopy (vats)/thorocotomy Right 02/13/2014    Procedure: VIDEO ASSISTED THORACOSCOPY (VATS)/THOROCOTOMY;  Surgeon: [name], MD;  Location: [facility] OR;  Service: Thoracic;  Laterality: Right;  PATIENT NEEDS EPIDURAL CATHETER (DR. CHRIS MOSER AWARE PER PVT)  . Resection of mediastinal mass Right 02/13/2014    Procedure: RESECTION OF MEDIASTINAL MASS;  Surgeon: [name], MD;  Location: [facility] OR;  Service: Thoracic;  Laterality: Right;  PATIENT NEEDS EPIDURAL CATHETER AND A SWAN GANZ CATHETER (DR. CHRIS MOSER AWARE PER PVT)      Social History:  reports that he has never smoked. He has never used smokeless tobacco. He reports that he does not drink  alcohol or use illicit drugs. Where does patient live--home  Can patient participate in ADLs?Yes  Allergies  Allergen Reactions  . Bidil [Isosorb Dinitrate-Hydralazine]     Headaches   . Spironolactone     Hyperkalemia   . Hydralazine Hcl     Fuzzy headed    No family history on file.      Prior to Admission medications   Medication Sig Start Date End Date Taking? Authorizing Provider  atorvastatin (LIPITOR) 40 MG tablet TAKE 1 TABLET (40 MG TOTAL) BY MOUTH AT BEDTIME. 01/12/15  Yes Shaune Pascal Bensimhon, MD  BREO ELLIPTA 100-25 MCG/INH AEPB Inhale 1 puff into the lungs daily. 12/11/14  Yes Historical Provider, MD  carvedilol (COREG) 3.125 MG tablet TAKE 1 TABLET (3.125 MG TOTAL) BY MOUTH 2 (TWO) TIMES DAILY WITH A MEAL. 01/07/15  Yes Amy D Clegg, NP  digoxin (LANOXIN) 0.25 MG tablet TAKE 1/2 TAB (0.125 MG TOTAL) BY MOUTH DAILY. 01/21/15  Yes Amy D Clegg, NP  Febuxostat (ULORIC) 80 MG TABS Take 80 mg by mouth at bedtime.   Yes Historical Provider, MD  ivabradine (CORLANOR) 5 MG TABS tablet Take 1 tablet (5 mg total) by mouth 2 (two) times daily with a meal. 10/31/14  Yes Larey Dresser, MD  losartan (COZAAR) 25 MG tablet TAKE 1 TABLET (25 MG TOTAL) BY MOUTH DAILY. 09/09/14  Yes Jolaine Artist, MD  magnesium oxide (MAG-OX) 400 MG tablet Take 400 mg by mouth 2 (two) times daily.    Yes Historical Provider, MD  MITIGARE 0.6 MG CAPS Take 0.6 mg by mouth daily. 01/17/15  Yes Historical Provider, MD  tobramycin-dexamethasone [facility]) ophthalmic solution Place 2 drops into the right eye 2 (two) times daily. 12/09/13  Yes Historical Provider, MD  torsemide (DEMADEX) 10 MG tablet TAKE 10MG  TABLET ONCE DAILY. ON WEDNESDAY AND SATURDAYS, TAKE EXTRA TABLET (20MG  TOTAL). Patient taking differently: TAKE 10MG  TABLET ONCE DAILY. 01/19/15  Yes Amy D Clegg, NP  predniSONE (DELTASONE) 10 MG tablet Take 4 a  day for 3 days 3 a day for 3 days 2 a day for 3 days one a day for 3 days Patient not taking:  Reported on 01/31/2015 12/14/14   Darlyne Russian, MD  pregabalin (LYRICA) 150 MG capsule Take 1 capsule (150 mg total) by mouth at bedtime. Patient not taking: Reported on 01/31/2015 10/31/14   Jolaine Artist, MD     Physical Exam: Filed Vitals:   01/31/15 1833  BP: 114/76  Pulse: 93  Temp: 99.8 F (37.7 C)  TempSrc: Oral  Resp: 18  SpO2: 98%     Constitutional: Vital signs reviewed. Patient is a well-developed and well-nourished in no acute distress and cooperative with exam. Alert and oriented x3.  Head: Normocephalic and atraumatic  Nares:pale boggy turbinates with rhinorrhea Ear: TM normal bilaterally  Mouth: no erythema or exudates, MMM postnasal drip present Eyes: PERRL, EOMI, conjunctival injection of the right eye with mucus discharge.  Neck: Supple, Trachea midline normal ROM, No JVD, mass, thyromegaly, or carotid bruit present.  Cardiovascular: Mildly tachycardic, S1 normal, S2 normal, positive systolic ejection murmur, pulses symmetric and intact bilaterally  Pulmonary/Chest: Very diminished air movement on the right lung field. Left lung field sounds clear to auscultation Abdominal: Soft. Non-tender, non-distended, bowel sounds are normal, no masses, organomegaly, or guarding present.  GU: no CVA tenderness Musculoskeletal: Left knee is warm with mild swelling and erythema. No joint deformities, or stiffness, ROM full and no nontender Ext: +1 pitting edema and no cyanosis, pulses palpable bilaterally (DP and PT)  Hematology: no cervical, inginal, or axillary adenopathy.  Neurological: A&O x3, Strenght is normal and symmetric bilaterally, cranial nerve II-XII are grossly intact, no focal motor deficit, sensory intact to light touch bilaterally.  Skin: Warm, dry and intact. No rash, cyanosis, or clubbing.  Psychiatric: Normal mood and affect. speech and behavior is normal. Judgment and thought content normal. Cognition and memory are normal.      Data Review    Micro Results Recent Results (from the past 240 hour(s))  Body fluid culture     Status: None (Preliminary result)   Collection Time: 01/31/15  4:15 PM  Result Value Ref Range Status   Gram Stain Few  Preliminary   Gram Stain WBC present-predominately PMN  Preliminary   Gram Stain No Organisms Seen  Preliminary    Radiology Reports Dg Chest 2 View  01/31/2015  CLINICAL DATA:  Shortness of breath for  2-3 weeks. Initial encounter. EXAM: CHEST  2 VIEW COMPARISON:  CT chest 09/30/2014. PA and lateral chest 02/23/2015 and 05/22/2014. FINDINGS: There is cardiomegaly. Postoperative change in the right chest is identified with associated scar, stable in appearance. The left lung is clear. No pneumothorax or pleural effusion. AICD is unchanged in appearance. IMPRESSION: Cardiomegaly without acute disease. Electronically Signed   By: Inge Rise M.D.   On: 01/31/2015 17:03     CBC  Recent Labs Lab 01/31/15 1652  WBC 14.2*  HGB 16.4  HCT 48.8  PLT 203  MCV 89.1  MCH 29.9  MCHC 33.6  RDW 15.0  LYMPHSABS 1.5  MONOABS 2.5*  EOSABS 0.0  BASOSABS 0.0    Chemistries   Recent Labs Lab 01/31/15 1652  NA 136  K 4.9  CL 102  CO2 26  GLUCOSE 105*  BUN 22*  CREATININE 1.60*  CALCIUM 9.3   ------------------------------------------------------------------------------------------------------------------ estimated creatinine clearance is 64 mL/min (by C-G formula based on Cr of 1.6). ------------------------------------------------------------------------------------------------------------------ No results for input(s): HGBA1C in the last 72 hours. ------------------------------------------------------------------------------------------------------------------ No results for input(s): CHOL, HDL, LDLCALC, TRIG, CHOLHDL, LDLDIRECT in the last 72 hours. ------------------------------------------------------------------------------------------------------------------ No results  for input(s): TSH, T4TOTAL, T3FREE, THYROIDAB in the last 72 hours.  Invalid input(s): FREET3 ------------------------------------------------------------------------------------------------------------------ No results for input(s): VITAMINB12, FOLATE, FERRITIN, TIBC, IRON, RETICCTPCT in the last 72 hours.  Coagulation profile No results for input(s): INR, PROTIME in the last 168 hours.  No results for input(s): DDIMER in the last 72 hours.  Cardiac Enzymes No results for input(s): CKMB, TROPONINI, MYOGLOBIN in the last 168 hours.  Invalid input(s): CK ------------------------------------------------------------------------------------------------------------------ Invalid input(s): POCBNP   CBG: No results for input(s): GLUCAP in the last 168 hours.     EKG: Independently reviewed.    Assessment/Plan Principal Problem:  Acute on chronic combined systolic and diastolic congestive heart failure : Patient reports not taking his Demadex at least 3-4 days ago due to a gout flare. Likely reason for elevated BNP of 614 -Lasix 40 mg IV 2 total doses -Strict In's and out's -Continue Coreg, digoxin, ivarbrdine, Cozaar -Possible DC in a.m.  Right hemi-diaphragm paralysis: The patient notes this happened following his surgery to remove a Paraganglioma in 02/2014. Patient has a appointment with the specialist on Monday for which he is trying to have this corrected. This is likely the reason for his tachypnea.  Gout flare of left knee: Acute. flare for which the patient had a arthrocentesis of fluid today. He denies receiving a steroid injection. Physical exam reveals a warm and mildly swollen knee. Wide to avoid NSAIDs due to the elevation in his creatinine. Desmadex is associated with increased risk of gout flares. Advised patient to discuss this with his PCP.  -Encourage patient regarding dietary modifications to decrease gout flares -Continue Uloric -steriods 60 mg 1 dose also  discussed about possible steroid injection of the knee  Essential hypertension, benign. Well controlled -Continue medications as seen above  Automatic implantable cardioverter-defibrillator in situ: Stable. Remotely checked last in July 2016.   Chronic kidney disease stage III: Patient baseline creatinine is around 1.56 and upon admission today his creatinine was 1.6 only slightly elevated from baseline -f/u bmp in am  Status post corneal transplant right eye. -Continue TobraDex    OSA on BiPAP: Chronic -Continue BiPAP settings of 12 and 6 per last titration note on 10/27/2014  Hyperlipidemia -Continue atorvastatin  Code Status:   full Family Communication: bedside Disposition Plan: admit   Total time spent 55  minutes.Greater than 50% of this time was spent in counseling, explanation of diagnosis, planning of further management, and coordination of care  [facility] Pager [phone]  If 7PM-7AM, please contact night-coverage [email or web address] Password [facility] 01/31/2015, 8:23 PM

## 2015-02-01 ENCOUNTER — Encounter (HOSPITAL_COMMUNITY): Payer: Self-pay | Admitting: *Deleted

## 2015-02-01 DIAGNOSIS — N183 Chronic kidney disease, stage 3 unspecified: Secondary | ICD-10-CM

## 2015-02-01 DIAGNOSIS — I5023 Acute on chronic systolic (congestive) heart failure: Secondary | ICD-10-CM

## 2015-02-01 DIAGNOSIS — I509 Heart failure, unspecified: Secondary | ICD-10-CM | POA: Diagnosis not present

## 2015-02-01 DIAGNOSIS — J986 Disorders of diaphragm: Secondary | ICD-10-CM

## 2015-02-01 LAB — BASIC METABOLIC PANEL
ANION GAP: 9 (ref 5–15)
BUN: 30 mg/dL — ABNORMAL HIGH (ref 6–20)
CALCIUM: 8.8 mg/dL — AB (ref 8.9–10.3)
CO2: 25 mmol/L (ref 22–32)
Chloride: 98 mmol/L — ABNORMAL LOW (ref 101–111)
Creatinine, Ser: 1.75 mg/dL — ABNORMAL HIGH (ref 0.61–1.24)
GFR calc Af Amer: 49 mL/min — ABNORMAL LOW (ref >60)
GFR, EST NON AFRICAN AMERICAN: 42 mL/min — AB (ref >60)
GLUCOSE: 139 mg/dL — AB (ref 65–99)
POTASSIUM: 4.7 mmol/L (ref 3.5–5.1)
SODIUM: 132 mmol/L — AB (ref 135–145)

## 2015-02-01 LAB — URIC ACID: Uric Acid, Serum: 8.8 mg/dL — ABNORMAL HIGH (ref 4.4–7.6)

## 2015-02-01 LAB — MAGNESIUM: MAGNESIUM: 1.6 mg/dL — AB (ref 1.7–2.4)

## 2015-02-01 MED ORDER — TORSEMIDE 10 MG PO TABS
ORAL_TABLET | ORAL | Status: DC
Start: 2015-02-01 — End: 2015-03-10

## 2015-02-01 MED ORDER — HYDROCODONE-ACETAMINOPHEN 5-325 MG PO TABS: 1.0000 | ORAL_TABLET | Freq: Four times a day (QID) | ORAL | Status: DC | PRN

## 2015-02-01 MED ORDER — PREDNISONE 10 MG PO TABS
ORAL_TABLET | ORAL | Status: DC
Start: 2015-02-01 — End: 2015-03-10

## 2015-02-01 MED ORDER — INFLUENZA VAC SPLIT QUAD 0.5 ML IM SUSY: 0.5000 mL | PREFILLED_SYRINGE | INTRAMUSCULAR | Status: DC

## 2015-02-01 MED ORDER — COLCHICINE 0.6 MG PO TABS: 0.6000 mg | ORAL_TABLET | Freq: Every day | ORAL | Status: DC | PRN

## 2015-02-01 MED ORDER — PREDNISONE 50 MG PO TABS
50.0000 mg | ORAL_TABLET | Freq: Every day | ORAL | Status: DC
  Administered 2015-02-01: 50 mg via ORAL
  Filled 2015-02-01: qty 1

## 2015-02-01 MED ORDER — MAGNESIUM SULFATE 2 GM/50ML IV SOLN
2.0000 g | Freq: Once | INTRAVENOUS | Status: AC
  Administered 2015-02-01: 2 g via INTRAVENOUS
  Filled 2015-02-01: qty 50

## 2015-02-01 NOTE — Discharge Instructions (Signed)

## 2015-02-01 NOTE — Discharge Summary (Addendum)
Physician Discharge Summary  [name] [id number] DOB: Aug 25, 1960 DOA: 01/31/2015  PCP: Gennette Pac, MD  Admit date: 01/31/2015 Discharge date: 02/01/2015  Recommendations for Outpatient Follow-up:  1. Pt will need to follow up with PCP in 1-2 weeks post discharge 2. Please obtain BMP to evaluate electrolytes and kidney function, K and Mg level 3. Please note that Mg was supplemented prior to discharge  4. Please also check CBC to evaluate Hg and Hct levels  Discharge Diagnoses:  Principal Problem:   Acute on chronic heart failure (HCC) Active Problems:   Essential hypertension, benign   Automatic implantable cardioverter-defibrillator in situ   Gout flare   OSA on CPAP   Leukocytosis   Status post corneal transplant   CKD (chronic kidney disease) stage 3, GFR 30-59 ml/min   Diaphragm paralysis   Acute on chronic congestive heart failure ([place])  Discharge Condition: Stable  Diet recommendation: Heart healthy diet discussed in details   History of present illness:  54 year old-male with known ystolic congestive heart failure last echo EF 10% as of 10/29/2014, gout, chronic kidney disease stage II, s/p pacemaker defibrillator, OSA on CPAP, 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 Patient noted that he had went in to urgent care for a acute gout flare of his left knee. During that time they drain fluid off of the knee. He reports that he stopped his Lasix approximately 3 days ago prior to Thanksgiving because he felt his gout flaring up and his knee.   Hospital Course: Acute on chronic combined systolic and diastolic congestive heart failure - Patient reports not taking his Demadex at least 3-4 days ago due to a gout flare. Likely reason for elevated BNP of 614 - Lasix 40 mg IV 2 total doses given - Continue Coreg, digoxin, ivarbrdine, Cozaar - pt wants to go home   Right  hemi-diaphragm paralysis - The patient notes this happened following his surgery to remove a Paraganglioma in 02/2014.  - Patient has a appointment with the specialist on Monday for which he is trying to have this corrected.   Gout flare of left knee - Acute. flare for which the patient had a arthrocentesis of fluid today - started on Colchicine as needed  - placed on steroid taper   Essential hypertension, benign -Continue medications as per home regimen   Automatic implantable cardioverter -defibrillator in situ: Stable. Remotely checked last in July 2016.   Chronic kidney disease stage III - Patient baseline creatinine is around 1.56  - upon admission today his creatinine was 1.6 only slightly elevated from baseline  Status post corneal transplant right eye - Continue TobraDex   OSA on BiPAP - Chronic - Continue BiPAP settings of 12 and 6 per last titration note on 10/27/2014  Hyperlipidemia - Continue atorvastatin  Obesity - Body mass index is 32.17 kg/(m^2).  Procedures/Studies: Dg Chest 2 View  01/31/2015 Cardiomegaly without acute disease.   Discharge Exam: Filed Vitals:   02/01/15 0121 02/01/15 0637  BP:  121/72  Pulse: 80 70  Temp:  97.4 F (36.3 C)  Resp: 18 20   Filed Vitals:   01/31/15 1900 01/31/15 2157 02/01/15 0121 02/01/15 0637  BP: 102/67 124/67  121/72  Pulse:  96 80 70  Temp:  97.9 F (36.6 C)  97.4 F (36.3 C)  TempSrc:  Oral  Axillary  Resp:  18 18 20   Height:  5\' 10"  (1.778 m)  Weight:  102.3 kg (225 lb 8.5 oz)  101.7 kg (224 lb 3.3 oz)  SpO2:  96% 98% 97%    General: Pt is alert, follows commands appropriately, not in acute distress Cardiovascular: Regular rate and rhythm, no rubs, no gallops Respiratory: Clear to auscultation bilaterally, no wheezing, no crackles, no rhonchi Abdominal: Soft, non tender, non distended, bowel sounds +, no guarding  Discharge Instructions  Discharge Instructions    Diet - low sodium heart  healthy    Complete by:  As directed      Increase activity slowly    Complete by:  As directed             Medication List    STOP taking these medications        pregabalin 150 MG capsule  Commonly known as:  LYRICA      TAKE these medications        atorvastatin 40 MG tablet  Commonly known as:  LIPITOR  TAKE 1 TABLET (40 MG TOTAL) BY MOUTH AT BEDTIME.     BREO ELLIPTA 100-25 MCG/INH Aepb  Generic drug:  Fluticasone Furoate-Vilanterol  Inhale 1 puff into the lungs daily.     carvedilol 3.125 MG tablet  Commonly known as:  COREG  TAKE 1 TABLET (3.125 MG TOTAL) BY MOUTH 2 (TWO) TIMES DAILY WITH A MEAL.     digoxin 0.25 MG tablet  Commonly known as:  LANOXIN  TAKE 1/2 TAB (0.125 MG TOTAL) BY MOUTH DAILY.     HYDROcodone-acetaminophen 5-325 MG tablet  Commonly known as:  NORCO/VICODIN  Take 1 tablet by mouth every 6 (six) hours as needed for moderate pain.     ivabradine 5 MG Tabs tablet  Commonly known as:  CORLANOR  Take 1 tablet (5 mg total) by mouth 2 (two) times daily with a meal.     losartan 25 MG tablet  Commonly known as:  COZAAR  TAKE 1 TABLET (25 MG TOTAL) BY MOUTH DAILY.     magnesium oxide 400 MG tablet  Commonly known as:  MAG-OX  Take 400 mg by mouth 2 (two) times daily.     MITIGARE 0.6 MG Caps  Generic drug:  Colchicine  Take 0.6 mg by mouth daily.     colchicine 0.6 MG tablet  Take 1 tablet (0.6 mg total) by mouth daily as needed.     predniSONE 10 MG tablet  Commonly known as:  DELTASONE  Take 50 mg tablet 11/28 and taper down by 10 mg daily until completed     tobramycin-dexamethasone ophthalmic solution  Commonly known as:  TOBRADEX  Place 2 drops into the right eye 2 (two) times daily.     torsemide 10 MG tablet  Commonly known as:  DEMADEX  TAKE 10MG  TABLET ONCE DAILY.     ULORIC 80 MG Tabs  Generic drug:  Febuxostat  Take 80 mg by mouth at bedtime.            Follow-up Information    Follow up with Gennette Pac, MD.   Specialty:  Family Medicine   Contact information:   [place] [address] [phone]       Call Faye Ramsay, MD.   Specialty:  Internal Medicine   Why:  As needed call my cell phone [phone]   Contact information:   [address] [place] [address] [phone]        The results of significant diagnostics from  this hospitalization (including imaging, microbiology, ancillary and laboratory) are listed below for reference.     Microbiology: Recent Results (from the past 240 hour(s))  Body fluid culture     Status: None (Preliminary result)   Collection Time: 01/31/15  4:15 PM  Result Value Ref Range Status   Gram Stain Few  Preliminary   Gram Stain WBC present-predominately PMN  Preliminary   Gram Stain No Organisms Seen  Preliminary   Preliminary Report NO GROWTH  Preliminary     Labs: Basic Metabolic Panel:  Recent Labs Lab 01/31/15 1652 02/01/15 0448  NA 136 132*  K 4.9 4.7  CL 102 98*  CO2 26 25  GLUCOSE 105* 139*  BUN 22* 30*  CREATININE 1.60* 1.75*  CALCIUM 9.3 8.8*  MG  --  1.6*   CBC:  Recent Labs Lab 01/31/15 1652  WBC 14.2*  NEUTROABS 10.2*  HGB 16.4  HCT 48.8  MCV 89.1  PLT 203   BNP (last 3 results)  Recent Labs  01/31/15 1652  BNP 612.0*    ProBNP (last 3 results)  Recent Labs  02/10/14 1755  PROBNP 653.7*   SIGNED: Time coordinating discharge: 30 minutes  Faye Ramsay, MD  [facility] 02/01/2015, 10:45 AM Pager [phone]  If 7PM-7AM, please contact night-coverage [email or web address] Password [email or web address]

## 2015-02-04 ENCOUNTER — Other Ambulatory Visit: Payer: Self-pay | Admitting: *Deleted

## 2015-02-04 DIAGNOSIS — R222 Localized swelling, mass and lump, trunk: Secondary | ICD-10-CM

## 2015-02-04 LAB — BODY FLUID CULTURE
GRAM STAIN: NONE SEEN
Organism ID, Bacteria: NO GROWTH

## 2015-03-04 ENCOUNTER — Ambulatory Visit: Payer: 59 | Admitting: Cardiothoracic Surgery

## 2015-03-04 ENCOUNTER — Other Ambulatory Visit: Payer: 59

## 2015-03-10 ENCOUNTER — Encounter: Payer: Self-pay | Admitting: Internal Medicine

## 2015-03-10 ENCOUNTER — Other Ambulatory Visit: Payer: Self-pay

## 2015-03-10 ENCOUNTER — Ambulatory Visit (INDEPENDENT_AMBULATORY_CARE_PROVIDER_SITE_OTHER): Payer: 59 | Admitting: Internal Medicine

## 2015-03-10 VITALS — BP 122/90 | HR 100 | Ht 70.0 in | Wt 229.8 lb

## 2015-03-10 DIAGNOSIS — Z95810 Presence of automatic (implantable) cardiac defibrillator: Secondary | ICD-10-CM

## 2015-03-10 DIAGNOSIS — M1000 Idiopathic gout, unspecified site: Secondary | ICD-10-CM | POA: Diagnosis not present

## 2015-03-10 DIAGNOSIS — I5022 Chronic systolic (congestive) heart failure: Secondary | ICD-10-CM | POA: Diagnosis not present

## 2015-03-10 LAB — CUP PACEART INCLINIC DEVICE CHECK
Battery Voltage: 2.98 V
Brady Statistic AP VP Percent: 0.06 %
Brady Statistic AP VS Percent: 0 %
Brady Statistic AS VP Percent: 99.13 %
Brady Statistic AS VS Percent: 0.81 %
Brady Statistic RA Percent Paced: 0.06 %
HIGH POWER IMPEDANCE MEASURED VALUE: 51 Ohm
HighPow Impedance: 41 Ohm
Implantable Lead Implant Date: 20100205
Implantable Lead Implant Date: 20100205
Implantable Lead Location: 753859
Implantable Lead Model: 5076
Implantable Lead Model: 6947
Lead Channel Impedance Value: 342 Ohm
Lead Channel Impedance Value: 418 Ohm
Lead Channel Impedance Value: 551 Ohm
Lead Channel Pacing Threshold Amplitude: 0.75 V
Lead Channel Pacing Threshold Pulse Width: 0.4 ms
Lead Channel Pacing Threshold Pulse Width: 0.4 ms
Lead Channel Sensing Intrinsic Amplitude: 17.5 mV
Lead Channel Setting Pacing Amplitude: 2 V
Lead Channel Setting Pacing Amplitude: 2 V
Lead Channel Setting Pacing Amplitude: 2.5 V
Lead Channel Setting Pacing Pulse Width: 0.4 ms
Lead Channel Setting Pacing Pulse Width: 0.4 ms
MDC IDC LEAD IMPLANT DT: 20080829
MDC IDC LEAD LOCATION: 753858
MDC IDC LEAD LOCATION: 753860
MDC IDC LEAD MODEL: 4194
MDC IDC MSMT BATTERY REMAINING LONGEVITY: 57 mo
MDC IDC MSMT LEADCHNL LV IMPEDANCE VALUE: 285 Ohm
MDC IDC MSMT LEADCHNL LV IMPEDANCE VALUE: 418 Ohm
MDC IDC MSMT LEADCHNL RA IMPEDANCE VALUE: 418 Ohm
MDC IDC MSMT LEADCHNL RA PACING THRESHOLD AMPLITUDE: 0.5 V
MDC IDC MSMT LEADCHNL RA PACING THRESHOLD PULSEWIDTH: 0.4 ms
MDC IDC MSMT LEADCHNL RA SENSING INTR AMPL: 1.8 mV
MDC IDC MSMT LEADCHNL RV PACING THRESHOLD AMPLITUDE: 0.75 V
MDC IDC SESS DTM: 20170103101804
MDC IDC SET LEADCHNL RV SENSING SENSITIVITY: 0.6 mV
MDC IDC STAT BRADY RV PERCENT PACED: 98.26 %

## 2015-03-10 LAB — BASIC METABOLIC PANEL
BUN: 21 mg/dL (ref 7–25)
CHLORIDE: 102 mmol/L (ref 98–110)
CO2: 27 mmol/L (ref 20–31)
Calcium: 9.2 mg/dL (ref 8.6–10.3)
Creat: 1.71 mg/dL — ABNORMAL HIGH (ref 0.70–1.33)
Glucose, Bld: 112 mg/dL — ABNORMAL HIGH (ref 65–99)
POTASSIUM: 4.2 mmol/L (ref 3.5–5.3)
Sodium: 139 mmol/L (ref 135–146)

## 2015-03-10 LAB — MAGNESIUM: Magnesium: 1.3 mg/dL — ABNORMAL LOW (ref 1.5–2.5)

## 2015-03-10 LAB — URIC ACID: URIC ACID, SERUM: 6 mg/dL (ref 4.0–7.8)

## 2015-03-10 NOTE — Progress Notes (Signed)
Patient referred to [facility] clinic by Dr Caryl Comes.  Met patient at office appointment and provided ICM intro and he agreed to monthly follow up.  Dr Caryl Comes requested ICM transmission in 2 weeks and scheduled on 03/24/2015.  Patient aware of scheduled transmission date.Marland Kitchen

## 2015-03-10 NOTE — Addendum Note (Signed)
Addended by: Eulis Foster on: 03/10/2015 09:05 AM   Modules accepted: Orders

## 2015-03-10 NOTE — Patient Instructions (Signed)
Medication Instructions: - no changes  Labwork: - Your physician recommends that you have lab work today: BMET/ Magenesium  Procedures/Testing: - none  Follow-Up: - Remote monitoring is used to monitor your Pacemaker of ICD from home. This monitoring reduces the number of office visits required to check your device to one time per year. It allows [place] to keep an eye on the functioning of your device to ensure it is working properly. You are scheduled for a device check from home on Tuesday 03/24/15 ( fluid check only). You may send your transmission at any time that day. If you have a wireless device, the transmission will be sent automatically. After your physician reviews your transmission, you will receive a postcard with your next transmission date.  - Remote monitoring is used to monitor your Pacemaker of ICD from home. This monitoring reduces the number of office visits required to check your device to one time per year. It allows [place] to keep an eye on the functioning of your device to ensure it is working properly. You are scheduled for a device check from home on 06/09/15 (full transmission). You may send your transmission at any time that day. If you have a wireless device, the transmission will be sent automatically. After your physician reviews your transmission, you will receive a postcard with your next transmission date.  - Your physician wants you to follow-up in: 1 year with Dr. Caryl Comes. You will receive a reminder letter in the mail two months in advance. If you don't receive a letter, please call our office to schedule the follow-up appointment.   Any Additional Special Instructions Will Be Listed Below (If Applicable).

## 2015-03-10 NOTE — Progress Notes (Signed)
Patient Care Team: Hulan Fess, MD as PCP - General (Family Medicine) Deboraha Sprang, MD as PCP - Cardiology (Cardiology) Collene Gobble, MD as Consulting Physician (Pulmonary Disease) Beryle Lathe, MD (General Surgery)   HPI  [name] is a 55 y.o. male is seen in followup for congestive heart failure in the setting of nonischemic cardiomyopathy. He is status post CRT-D. implantation. He had been previously treated for VT with amiodarone which was discontinued because of  lightheadedness He was seen in jan 2103 with flurries of VT requiring therapy which I had hoped might be attributable to mechanical-electric issues aggravated by CHF   He was hospitalized again 11/13 for multiple inappropriate ICD shocks attributed to hypokalemia secondary acute renal insufficiency and T wave oversensing.  The device was reprogrammed to decrease sensitivity from 0.3-0.6 mV and the NID was increased.   He also has OSA on CPAP. 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 Gout 08/03/12    PT C/O OF RIGHT KNEE PAIN AND SWELLING -STATES GOUT FLARE UP - ONGOING SINCE APRIL - BUT SWELLING W/IN LAST WEEK  . Cardiomyopathy     Idiopathic dilated;   Marland Kitchen Ventricular tachycardia ([place])     s/p ICD  . Sleep apnea     CPAP  . Systolic CHF ([place])     EF 15%  . CKD (chronic kidney disease)     stage III baseline Crt 1.8-2.0  . Biventricular ICD (implantable cardiac defibrillator) Medtronic ]     DOI 2008/ upgrade 2010/ Gen Change 2014  . Automatic implantable cardioverter-defibrillator in situ   . CHF (congestive heart failure) ([place])   . Bladder tumor     PT  HOSP AT [facility] 4/24 TO 4/26 2014 WITH UTI--AND FOUND TO HAVE BLADDER TUMOR-  . Arthritis     GOUT  . Hilar mass     Noted CT 2010  . HTN (hypertension)     Dr. Caryl Comes cardiologist  . Coronary artery disease   . Hypothyroidism   . 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 Cholecystectomy    . Cardiac catheterization      he was found to have normal coronary arteries but with a globally dilated and hypocontractile heart  . US echocardiography  03-17-2008, 07-03-2006    EF 15-20%, EF 15-20%  . Transurethral resection of bladder tumor N/A 08/13/2012    Procedure: TRANSURETHRAL RESECTION OF BLADDER TUMOR (TURBT);  Surgeon: Claybon Jabs, MD;  Location: [facility] ORS;  Service: Urology;  Laterality: N/A;  MITOMYCIN C  . Cardiac defibrillator placement      DOI 2008/ upgrade 2010/ Gen Change 2014   . Endobronchial ultrasound Bilateral 10/01/2012    Procedure: ENDOBRONCHIAL ULTRASOUND;  Surgeon: Collene Gobble, MD;  Location: [facility] ENDOSCOPY;  Service: Cardiopulmonary;  Laterality: Bilateral;  . Cornea replacement    . Video bronchoscopy N/A 10/25/2012    Procedure: VIDEO BRONCHOSCOPY;  Surgeon: [name], MD;  Location: [place];  Service: Thoracic;  Laterality: N/A;  . Mediastinoscopy N/A 10/25/2012  Procedure: MEDIASTINOSCOPY;  Surgeon: [name], MD;  Location: [facility] OR;  Service: Thoracic;  Laterality: N/A;  . Tonsillectomy    . Mediastinotomy  09/10/2013    paratracheal mass    DR VANTRIGT  . Chest tube insertion  09/10/2013  . Mediastinotomy chamberlain mcneil Right 09/10/2013    Procedure: MEDIASTINOTOMY CHAMBERLAIN MCNEIL;  Surgeon: [name], MD;  Location: [place];  Service: Thoracic;  Laterality: Right;  . Biv icd genertaor change out      DOI 2008/ upgrade 2010/ Gen Change 2014   . Right heart catheterization N/A 01/27/2012    Procedure: RIGHT HEART CATH;  Surgeon: Jolaine Artist, MD;  Location: [facility] CATH LAB;  Service: Cardiovascular;  Laterality: N/A;  . Implantable cardioverter defibrillator (icd) generator change N/A 05/02/2012    Procedure: ICD GENERATOR CHANGE;  Surgeon: Deboraha Sprang, MD;  Location: [facility] CATH LAB;  Service: Cardiovascular;  Laterality: N/A;  . Video assisted thoracoscopy (vats)/thorocotomy Right 02/13/2014    Procedure: VIDEO ASSISTED THORACOSCOPY (VATS)/THOROCOTOMY;  Surgeon: [name], MD;  Location: [facility] OR;  Service: Thoracic;  Laterality: Right;  PATIENT NEEDS EPIDURAL CATHETER (DR. CHRIS MOSER AWARE PER PVT)  . Resection of mediastinal mass Right 02/13/2014    Procedure: RESECTION OF MEDIASTINAL MASS;  Surgeon: [name], MD;  Location: [facility] OR;  Service: Thoracic;  Laterality: Right;  PATIENT NEEDS EPIDURAL CATHETER AND A SWAN GANZ CATHETER (DR. CHRIS MOSER AWARE PER PVT)    Current Outpatient Prescriptions  Medication Sig Dispense Refill  . atorvastatin (LIPITOR) 40 MG tablet TAKE 1 TABLET (40 MG TOTAL) BY MOUTH AT BEDTIME. 30 tablet 1  . BREO ELLIPTA 100-25 MCG/INH AEPB Inhale 1 puff into the lungs daily.  11  . carvedilol (COREG) 3.125 MG tablet TAKE 1 TABLET (3.125 MG TOTAL) BY MOUTH 2 (TWO) TIMES DAILY WITH A MEAL. 60 tablet 3  . colchicine 0.6 MG tablet Take 0.6 mg by mouth daily as needed (Gout).    Marland Kitchen digoxin (LANOXIN) 0.25 MG tablet TAKE 1/2 TAB (0.125 MG TOTAL) BY MOUTH DAILY. 15 tablet 3  . Febuxostat (ULORIC) 80 MG TABS Take 80 mg by mouth at bedtime.    Marland Kitchen HYDROcodone-acetaminophen (NORCO/VICODIN) 5-325 MG tablet Take 1 tablet by mouth every 6 (six) hours as needed for moderate pain. 30 tablet 0  . ivabradine (CORLANOR) 5 MG TABS tablet Take 1 tablet (5 mg total) by mouth 2 (two) times daily with a meal. 60 tablet 3  . losartan (COZAAR) 25 MG tablet TAKE 1 TABLET (25 MG TOTAL) BY MOUTH DAILY. 30 tablet 6  . magnesium oxide (MAG-OX) 400 MG tablet Take 400 mg by mouth 2 (two) times daily.     Marland Kitchen MITIGARE 0.6 MG CAPS  Take 0.6 mg by mouth daily.    . predniSONE (DELTASONE) 10 MG tablet Take 50 mg tablet 11/28 and taper down by 10 mg daily until completed 15 tablet 0  . tobramycin-dexamethasone (TOBRADEX) ophthalmic solution Place 2 drops into the right eye 2 (two) times daily.  5  . torsemide (DEMADEX) 10 MG tablet Take 10 mg by mouth daily.     No current facility-administered medications for this visit.    Allergies  Allergen Reactions  . Bidil [Isosorb Dinitrate-Hydralazine]     Headaches   . Spironolactone     Hyperkalemia   . Hydralazine Hcl     Fuzzy headed    Review of Systems negative except from HPI and PMH  Physical Exam There  were no vitals taken for this visit. Well developed and well nourished in no acute distress HENT normal E scleral and icterus clear Neck Supple JVP 8-10 cm; carotids brisk and full Clear to ausculation  Regular rate and rhythm, no murmurs gallops or rub Soft with active bowel sounds; fall  No clubbing cyanosis none Edema Alert and oriented, grossly normal motor and sensory function Skin Warm and Dry   ECG P. synchronous biventricular pacing   Assessment and  Plan  Nonischemic cardiomyopathy   Ventricular tachycardia-paroxysmal  Sinus tachycardia  Congestive heart failure-chronic-systolic   Implantable defibrillator-CRT-Medtronic The patient's device was interrogated.  The information was reviewed. No changes were made in the programming.     Renal insufficiency-grade 3   Diaphragmatic paralysis   The patient is relatively stable with persistent shortness of breath which is described at least in part to diaphragmatic paralysis. [facility] from [facility] were reviewed. He expresses gently his frustration with the dismissing of his concerns over the last year by his physicians here which prompted him to go to [facility] for pulmonary who then referred him to [facility] for the issue of his diaphragm. He is currently being considered for  diaphragmatic pacing. I told him that I would look into this.  He hopes this will help not only with breathing but the significant pain.   He is euvolemic; his optivol is elevated but seems to be leveling off. We will not adjust his diuretics; this hospitalization for gout in the context of his diuretics is appreciated.  He has had intercurrent ventricular tachycardia treated with ATP. There is also a 1-1 tachycardia with a QRS morphology similar to his VT. The VA time is very short, and probably too short for [place] conduction. The tachycardia terminates with A, excluding atrial tachycardia with a long AV conduction;  probably also includes a ventricular tachycardia given the very short VA time and thus would represent a SVT with a morphology similar to VT seen in 5-10% of patients

## 2015-03-11 ENCOUNTER — Other Ambulatory Visit: Payer: Self-pay | Admitting: *Deleted

## 2015-03-24 ENCOUNTER — Ambulatory Visit (INDEPENDENT_AMBULATORY_CARE_PROVIDER_SITE_OTHER): Payer: 59

## 2015-03-24 DIAGNOSIS — I5022 Chronic systolic (congestive) heart failure: Secondary | ICD-10-CM

## 2015-03-24 DIAGNOSIS — Z95810 Presence of automatic (implantable) cardiac defibrillator: Secondary | ICD-10-CM

## 2015-03-25 NOTE — Progress Notes (Signed)
EPIC Encounter for ICM Monitoring  Patient Name: [name] is a 55 y.o. male Date: 03/25/2015 Primary Care Physican: Gennette Pac, MD Primary Cardiologist: [place] Electrophysiologist: Faustino Congress Weight: 233 lbs  Bi-V Pacing 97.8%       In the past month, have you:  1. Gained more than 2 pounds in a day or more than 5 pounds in a week? no  2. Had changes in your medications (with verification of current medications)? no  3. Had more shortness of breath than is usual for you? no  4. Limited your activity because of shortness of breath? no  5. Not been able to sleep because of shortness of breath? no  6. Had increased swelling in your feet or ankles? no  7. Had symptoms of dehydration (dizziness, dry mouth, increased thirst, decreased urine output) no  8. Had changes in sodium restriction? no  9. Been compliant with medication? Yes   ICM trend: 3 month view 03/24/2015  ICM trend: 1 year view 03/24/2015   Follow-up plan: ICM clinic phone appointment 04/09/2015.  Optivol thoracic impedance below reference line since ~02/03/2015 with a couple of days at baseline.  He denied any HF symptoms. He reported he is having a lot of pain from gout and stopped taking Torsemide x 4 days due to physician told him it would make the gout worse. He had cortisone shot in the foot with gout on 03/23/2015.   He stated he resumed Torsemide 10 mg daily today.    Advised would send to Dr Caryl Comes for reivew.  Copy of note sent to patient's primary care physician, primary cardiologist, and device following physician.  Rosalene Billings, RN, CCM 03/25/2015 2:03 PM

## 2015-03-26 NOTE — Progress Notes (Signed)
Call to patient and notified Dr Caryl Comes would like a repeat transmission on 04/02/2015.  Explained Dr Caryl Comes said he can take the Torsemide every other day if he would like to determine how much it will effect the gout.  He stated he will continue to take the Torsemide every day as prescribed until next repeat transmission and depending on the fluid results, if he can cut back to every other day.    He verbalized understanding   Attempted patient call and left message for return call.    Reviewed remote ICM transmission with Dr Caryl Comes in the office.  He recommended to repeat remote ICM transmission in a week and patient can take Torsemide 10 mg every other day instead of everyday since the Torsemide has caused him gout flare ups.

## 2015-04-02 ENCOUNTER — Ambulatory Visit (INDEPENDENT_AMBULATORY_CARE_PROVIDER_SITE_OTHER): Payer: 59

## 2015-04-02 DIAGNOSIS — Z95810 Presence of automatic (implantable) cardiac defibrillator: Secondary | ICD-10-CM

## 2015-04-02 DIAGNOSIS — I5022 Chronic systolic (congestive) heart failure: Secondary | ICD-10-CM

## 2015-04-02 NOTE — Progress Notes (Signed)
EPIC Encounter for ICM Monitoring  Patient Name: [name] is a 55 y.o. male Date: 04/02/2015 Primary Care Physican: Gennette Pac, MD Primary Cardiologist: [place] Electrophysiologist: Faustino Congress Weight: 224 lbs  Bi-V Pacing 97.9%       In the past month, have you:  1. Gained more than 2 pounds in a day or more than 5 pounds in a week? No. Patient reported weight on 03/14/2015 as 233 lbs but that was with shoes and clothes.  Todays weight is early morning weight before dressing.  Education given to weigh daily at same time with same amount of clothes so that he can monitor for fluid weight of 2-3 lbs overnight or 5 pounds in a week.    2. Had changes in your medications (with verification of current medications)? no  3. Had more shortness of breath than is usual for you? no  4. Limited your activity because of shortness of breath? no  5. Not been able to sleep because of shortness of breath? no  6. Had increased swelling in your feet or ankles? no  7. Had symptoms of dehydration (dizziness, dry mouth, increased thirst, decreased urine output) no  8. Had changes in sodium restriction? no  9. Been compliant with medication? Yes   ICM trend: 3 month view for 04/02/2015   ICM trend: 1 year view for 04/02/2015   Follow-up plan: ICM clinic phone appointment 04/14/2015.  Optivol thoracic impedance returned to baseline today after taking Torsemide 10 mg every day except Saturday since last ICM transmission on 03/24/2015.  He said at this time he is doing fine and no gout symptoms but would like to take Torsemide 10 mg every other day to decrease the risk of another gout exacerbation (Dr Caryl Comes advised patient may take Torsemide every other day).  Will repeat transmission on 04/14/2015.  No changes today.    Copy of note sent to patient's primary care physician, primary cardiologist, and device following physician.  Rosalene Billings, RN, CCM 04/02/2015 12:19 PM

## 2015-04-08 ENCOUNTER — Ambulatory Visit
Admission: RE | Admit: 2015-04-08 | Discharge: 2015-04-08 | Disposition: A | Discharge status: Home / Self Care | Payer: 59 | Source: Ambulatory Visit | Attending: Cardiothoracic Surgery | Admitting: Cardiothoracic Surgery

## 2015-04-08 DIAGNOSIS — D447 Neoplasm of uncertain behavior of aortic body and other paraganglia: Secondary | ICD-10-CM

## 2015-04-08 DIAGNOSIS — R222 Localized swelling, mass and lump, trunk: Secondary | ICD-10-CM

## 2015-04-11 ENCOUNTER — Other Ambulatory Visit (HOSPITAL_COMMUNITY): Payer: Self-pay | Admitting: Internal Medicine

## 2015-04-14 ENCOUNTER — Ambulatory Visit (INDEPENDENT_AMBULATORY_CARE_PROVIDER_SITE_OTHER): Payer: 59

## 2015-04-14 DIAGNOSIS — I5022 Chronic systolic (congestive) heart failure: Secondary | ICD-10-CM | POA: Diagnosis not present

## 2015-04-14 DIAGNOSIS — Z95810 Presence of automatic (implantable) cardiac defibrillator: Secondary | ICD-10-CM

## 2015-04-14 NOTE — Progress Notes (Signed)
EPIC Encounter for ICM Monitoring  Patient Name: [name] is a 55 y.o. male Date: 04/14/2015 Primary Care Physican: Gennette Pac, MD Primary Cardiologist: Caryl Comes Electrophysiologist: Caryl Comes Dry Weight: 233 lb   Bi-V Pacing 98.2%      In the past month, have you:  1. Gained more than 2 pounds in a day or more than 5 pounds in a week? no  2. Had changes in your medications (with verification of current medications)? no  3. Had more shortness of breath than is usual for you? no  4. Limited your activity because of shortness of breath? no  5. Not been able to sleep because of shortness of breath? no  6. Had increased swelling in your feet or ankles? no  7. Had symptoms of dehydration (dizziness, dry mouth, increased thirst, decreased urine output) no  8. Had changes in sodium restriction? no  9. Been compliant with medication? Yes, Patient reported he is taking Torsemide in the last week as recommended by Dr Caryl Comes. He stated his gout has improved since taking the Torsemide every other day.     ICM trend: 3 month view for 04/14/2015   ICM trend: 1 year view for 04/14/2015   Follow-up plan: ICM clinic phone appointment 05/18/2015.  Optivol thoracic impedance slight above reference line.  Gout improved since taking Torsemide every other day and will continue with this dosage for now.  Encouraged to follow low salt diet.  No changes today.    Copy of note sent to patient's primary care physician, primary cardiologist, and device following physician.  Rosalene Billings, RN, CCM 04/14/2015 12:08 PM

## 2015-04-22 ENCOUNTER — Encounter: Payer: Self-pay | Admitting: Cardiothoracic Surgery

## 2015-04-22 ENCOUNTER — Ambulatory Visit (INDEPENDENT_AMBULATORY_CARE_PROVIDER_SITE_OTHER): Payer: 59 | Admitting: Cardiothoracic Surgery

## 2015-04-22 VITALS — BP 140/90 | HR 84 | Resp 20 | Ht 70.0 in | Wt 229.0 lb

## 2015-04-22 DIAGNOSIS — D447 Neoplasm of uncertain behavior of aortic body and other paraganglia: Secondary | ICD-10-CM | POA: Diagnosis not present

## 2015-04-22 NOTE — Progress Notes (Signed)
PCP is Gennette Pac, MD Referring Provider is Collene Gobble, MD  Chief Complaint  Patient presents with  . 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 Surgical margins were clean.the CT scan performed recently shows no evidence recurrent disease. The patient has persistent postthoracotomy pain. He takes hydrocodone every night to help him sleep. We will start him on Lyrica every hs  to see if that will help his postthoracotomy pain and refer the patient to the pain clinic.  The patient has persistent shortness of breath after the thoracotomy and resection of a large mediastinal tumor. The patient had preoperative restrictive lung disease with FEV1 1.2, 35% of predicted. The patient's perfusion capacity before surgery was 50% of predicted.. The patient has  been told that his breathing problems are due to a paralyzed right hemidiaphragm however the diaphragm was elevated prior to surgery from probable tumor invasion.  The patient has chronic heart failure with ejection fraction of 15%, nonischemic cardiomyopathy. He is followed regularly at the cancer care clinic.  The patient states his bladder tumor has been treated and has been cleared.   Past Medical History  Diagnosis Date  . Gout 08/03/12    PT C/O OF RIGHT KNEE PAIN AND SWELLING -STATES GOUT FLARE UP - ONGOING SINCE APRIL - BUT SWELLING W/IN LAST WEEK  . Cardiomyopathy     Idiopathic dilated;   Marland Kitchen Ventricular tachycardia ([place])     s/p ICD  . Sleep apnea     CPAP  . Systolic CHF ([place])     EF 15%  . CKD (chronic kidney disease)     stage III baseline Crt 1.8-2.0  . Biventricular ICD (implantable cardiac defibrillator) [employer]onic ]     DOI 2008/ upgrade 2010/ Gen Change 2014  . Automatic implantable cardioverter-defibrillator in situ   . CHF (congestive heart  failure) ([place])   . Bladder tumor     PT HOSP AT [facility] 4/24 TO 4/26 2014 WITH UTI--AND FOUND TO HAVE BLADDER TUMOR-  . Arthritis     GOUT  . Hilar mass     Noted CT 2010  . HTN (hypertension)     Dr. Caryl Comes cardiologist  . Coronary artery disease   . Hypothyroidism   . 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 Cholecystectomy    . Cardiac catheterization      he was found to have normal coronary arteries but with a globally dilated and hypocontractile heart  . US echocardiography  03-17-2008, 07-03-2006    EF 15-20%, EF 15-20%  . Transurethral resection of bladder tumor N/A 08/13/2012    Procedure: TRANSURETHRAL RESECTION OF BLADDER TUMOR (TURBT);  Surgeon: Claybon Jabs, MD;  Location: [facility] ORS;  Service: Urology;  Laterality: N/A;  MITOMYCIN C  . Cardiac defibrillator placement      DOI 2008/ upgrade 2010/ Gen Change 2014   . Endobronchial ultrasound Bilateral 10/01/2012    Procedure: ENDOBRONCHIAL ULTRASOUND;  Surgeon: Collene Gobble, MD;  Location: [facility] ENDOSCOPY;  Service: Cardiopulmonary;  Laterality: Bilateral;  . Cornea replacement    . Video bronchoscopy N/A 10/25/2012    Procedure: VIDEO BRONCHOSCOPY;  Surgeon: [name], MD;  Location: [place];  Service: Thoracic;  Laterality: N/A;  . Mediastinoscopy N/A 10/25/2012    Procedure: MEDIASTINOSCOPY;  Surgeon: [name], MD;  Location: [place];  Service: Thoracic;  Laterality: N/A;  . Tonsillectomy    . Mediastinotomy  09/10/2013    paratracheal mass    DR Huckleberry Martinson  . Chest tube insertion  09/10/2013  . Mediastinotomy chamberlain mcneil Right 09/10/2013    Procedure: MEDIASTINOTOMY CHAMBERLAIN MCNEIL;  Surgeon: [name], MD;  Location: [place];  Service: Thoracic;  Laterality: Right;  . Biv icd genertaor change out      DOI 2008/ upgrade 2010/ Gen Change 2014   . Right heart catheterization N/A 01/27/2012    Procedure:  RIGHT HEART CATH;  Surgeon: Jolaine Artist, MD;  Location: [facility] CATH LAB;  Service: Cardiovascular;  Laterality: N/A;  . Implantable cardioverter defibrillator (icd) generator change N/A 05/02/2012    Procedure: ICD GENERATOR CHANGE;  Surgeon: Deboraha Sprang, MD;  Location: [facility] CATH LAB;  Service: Cardiovascular;  Laterality: N/A;  . Video assisted thoracoscopy (vats)/thorocotomy Right 02/13/2014    Procedure: VIDEO ASSISTED THORACOSCOPY (VATS)/THOROCOTOMY;  Surgeon: [name], MD;  Location: [facility] OR;  Service: Thoracic;  Laterality: Right;  PATIENT NEEDS EPIDURAL CATHETER (DR. CHRIS MOSER AWARE PER PVT)  . Resection of mediastinal mass Right 02/13/2014    Procedure: RESECTION OF MEDIASTINAL MASS;  Surgeon: [name], MD;  Location: [facility] OR;  Service: Thoracic;  Laterality: Right;  PATIENT NEEDS EPIDURAL CATHETER AND A SWAN GANZ CATHETER (DR. CHRIS MOSER AWARE PER PVT)    No family history on file.  Social History Social History  Substance Use Topics  . Smoking status: Never Smoker   . Smokeless tobacco: Never Used  . Alcohol Use: No    Current Outpatient Prescriptions  Medication Sig Dispense Refill  . atorvastatin (LIPITOR) 40 MG tablet TAKE 1 TABLET (40 MG TOTAL) BY MOUTH AT BEDTIME. 30 tablet 1  . BREO ELLIPTA 100-25 MCG/INH AEPB Inhale 1 puff into the lungs daily.  11  . carvedilol (COREG) 3.125 MG tablet TAKE 1 TABLET (3.125 MG TOTAL) BY MOUTH 2 (TWO) TIMES DAILY WITH A MEAL. 60 tablet 3  . digoxin (LANOXIN) 0.25 MG tablet TAKE 1/2 TAB (0.125 MG TOTAL) BY MOUTH DAILY. 15 tablet 3  . Febuxostat (ULORIC) 80 MG TABS Take 80 mg by mouth at bedtime.    Marland Kitchen HYDROcodone-acetaminophen (NORCO/VICODIN) 5-325 MG tablet Take 1 tablet by mouth every 6 (six) hours as needed for moderate pain. 30 tablet 0  . ivabradine (CORLANOR) 5 MG TABS tablet Take 5 mg by mouth 2 (two) times daily with a meal. Pt taking about one tablet twice a week (03/10/15)    . losartan (COZAAR) 25 MG tablet TAKE 1  TABLET (25 MG TOTAL) BY MOUTH DAILY. 30 tablet 6  . magnesium oxide (MAG-OX) 400 MG tablet Take two tablets (800 mg) by mouth twice daily    . MITIGARE 0.6 MG CAPS Take 0.6 mg by mouth daily.    Marland Kitchen tobramycin-dexamethasone (TOBRADEX) ophthalmic solution Place 2 drops into the right eye 2 (two) times daily.  5  . torsemide (DEMADEX) 10 MG tablet Take 10 mg by mouth daily. Patient taking 10 mg every other day     No current facility-administered medications for this visit.    Allergies  Allergen Reactions  . Bidil [Isosorb Dinitrate-Hydralazine]     Headaches   . Spironolactone     Hyperkalemia   . Hydralazine Hcl     Fuzzy headed  Review of Systems        Review of Systems :  [ y ] = yes, [  ] = no        General :  Weight gain [   ]    Weight loss  [   ]  Fatigue [ yes ]  Fever [  ]  Chills  [  ]                                Weakness  [  ]           Cardiac :  Chest pain/ pressure [ right post thoracotomy pain ]  Resting SOB [  ] exertional SOB [place]  ]                        Orthopnea [  ]  Pedal edema  [mild from CHF  ]  Palpitations [  ] Syncope/presyncope [ ]                         Paroxysmal nocturnal dyspnea [  ]        Pulmonary : cough [  ]  wheezing [  ]  Hemoptysis [  ] Sputum [  ] Snoring [  ]                              Pneumothorax [  ]  Sleep apnea [ had sleep study per pulmonary medicine ]       GI : Vomiting [  ]  Dysphagia [  ]  Melena  [  ]  Abdominal pain [  ] BRBPR [  ]              Heart burn [  ]  Constipation [  ] Diarrhea  [  ] Colonoscopy [  ]       GU : Hematuria [  ]  Dysuria [  ]  Nocturia [  ] UTI's [  ] bladder cancer has been treated successfully       Vascular : Claudication [  ]  Rest pain [  ]  DVT [  ] Vein stripping [  ] leg ulcers [  ]                          TIA [  ] Stroke [  ]  Varicose veins [  ]       NEURO :  Headaches  [  ] Seizures [  ] Vision changes [  ] Paresthesias [  ]       Musculoskeletal :  Arthritis [  ] Gout  [  ]  Back  pain [  ]  Joint pain [  ]       Skin :  Rash [  ]  Melanoma [  ]        Heme : Bleeding problems [  ]Clotting Disorders [  ] Anemia [  ]Blood Transfusion [ ]        Endocrine : Diabetes [  ] Thyroid Disorder  [  ]       Psych : Depression [  ]  Anxiety [  ]  Psych hospitalizations [  ] Difficulty sleeping due to  2 postthoracotomy pain                                               BP 140/90 mmHg  Pulse 84  Resp 20  Ht 5\' 10"  (1.778 m)  Wt 229 lb (103.874 kg)  BMI 32.86 kg/m2  SpO2 97% Physical Exam       Physical Exam  General:  Overweight middle-aged AA male no acute distress. Frustration  over his chronic pain and shortness of breath with exertion. HEENT: Normocephalic pupils equal , dentition adequate Neck: Supple without JVD, adenopathy, or bruit Chest: Clear to auscultation, symmetrical breath sounds, no rhonchi, no tenderness             or deformity Cardiovascular: Regular rate and rhythm, no murmur, no gallop, peripheral pulses             palpable in all extremities Abdomen:  Soft, nontender, no palpable mass or organomegaly Extremities: Warm, well-perfused, no clubbing cyanosis edema or tenderness,              no venous stasis changes of the legs Rectal/GU: Deferred Neuro: Grossly non--focal and symmetrical throughout Skin: Clean and dry without rash or ulceration   Diagnostic Tests: CT scan of chest pressure reviewed and counseled with patient. There is no evidence of recurrent mediastinal tumor-paraganglioma There are postoperative chest wall changes-the patient did have a rib resected. The right hemidiaphragm is mildly elevated as it was preoperatively.  Impression: No evidence of recurrent mediastinal tumor-paraganglioma Chronic postthoracotomy pain. The patient will be referred to the pain clinic. Instead of using narcotic every night we will try a course of Lyrica  Plan: Return with CT scan of chest for surveillance  In one year-  Jamie Childs, MD [facility] [phone]

## 2015-05-08 ENCOUNTER — Other Ambulatory Visit: Payer: Self-pay | Admitting: Internal Medicine

## 2015-05-08 DIAGNOSIS — D447 Neoplasm of uncertain behavior of aortic body and other paraganglia: Secondary | ICD-10-CM

## 2015-05-15 ENCOUNTER — Inpatient Hospital Stay: Admission: RE | Admit: 2015-05-15 | Payer: 59 | Source: Ambulatory Visit

## 2015-05-18 ENCOUNTER — Ambulatory Visit (INDEPENDENT_AMBULATORY_CARE_PROVIDER_SITE_OTHER): Payer: 59

## 2015-05-18 DIAGNOSIS — Z95810 Presence of automatic (implantable) cardiac defibrillator: Secondary | ICD-10-CM | POA: Diagnosis not present

## 2015-05-18 DIAGNOSIS — I5022 Chronic systolic (congestive) heart failure: Secondary | ICD-10-CM

## 2015-05-18 NOTE — Progress Notes (Signed)
EPIC Encounter for ICM Monitoring  Patient Name: LINELL RUDDOCK is a 55 y.o. male Date: 05/18/2015 Primary Care Physican: Gennette Pac, MD Primary Cardiologist: Caryl Comes Electrophysiologist: Caryl Comes Dry Weight: 231 lbs   Bi-V Pacing 97.9%      In the past month, have you:  1. Gained more than 2 pounds in a day or more than 5 pounds in a week? no  2. Had changes in your medications (with verification of current medications)? no  3. Had more shortness of breath than is usual for you? no  4. Limited your activity because of shortness of breath? no  5. Not been able to sleep because of shortness of breath? no  6. Had increased swelling in your feet or ankles? no  7. Had symptoms of dehydration (dizziness, dry mouth, increased thirst, decreased urine output) no  8. Had changes in sodium restriction? no  9. Been compliant with medication? Yes   ICM trend: 3 month view for 05/18/2015   ICM trend: 1 year view for 05/18/2015   Follow-up plan: ICM clinic phone appointment on 06/18/2015.  Thoracic impedance trending along reference line suggesting stable fluid levels.  Patient denied any symptoms.   Education given to limit sodium intake to < 2000 mg and fluid intake to 64 oz daily.  Reviewed sodium levels of some foods he has been eating such as [employer] Baked Potato (35 mg) and Fish Sandwich (1015 mg).   Encouraged to call for any fluid symptoms.  No changes today.    Rosalene Billings, RN, CCM 05/18/2015 10:35 AM

## 2015-05-26 ENCOUNTER — Ambulatory Visit
Admission: RE | Admit: 2015-05-26 | Discharge: 2015-05-26 | Disposition: A | Discharge status: Home / Self Care | Payer: 59 | Source: Ambulatory Visit | Attending: Internal Medicine | Admitting: Internal Medicine

## 2015-05-26 DIAGNOSIS — D447 Neoplasm of uncertain behavior of aortic body and other paraganglia: Secondary | ICD-10-CM

## 2015-05-28 ENCOUNTER — Other Ambulatory Visit: Payer: 59

## 2015-06-03 ENCOUNTER — Other Ambulatory Visit (HOSPITAL_COMMUNITY): Payer: Self-pay | Admitting: Adult Health

## 2015-06-07 ENCOUNTER — Other Ambulatory Visit: Payer: Self-pay

## 2015-06-09 ENCOUNTER — Ambulatory Visit (INDEPENDENT_AMBULATORY_CARE_PROVIDER_SITE_OTHER): Payer: 59 | Admitting: *Deleted

## 2015-06-09 DIAGNOSIS — Z95810 Presence of automatic (implantable) cardiac defibrillator: Secondary | ICD-10-CM

## 2015-06-09 DIAGNOSIS — I472 Ventricular tachycardia: Secondary | ICD-10-CM | POA: Diagnosis not present

## 2015-06-09 DIAGNOSIS — I4729 Other ventricular tachycardia: Secondary | ICD-10-CM

## 2015-06-09 NOTE — Addendum Note (Signed)
Addended by: Rosalene Billings on: 06/09/2015 03:45 PM   Modules accepted: Level of Service

## 2015-06-09 NOTE — Progress Notes (Signed)
EPIC Encounter for ICM Monitoring  Patient Name: [name] is a 55 y.o. male Date: 06/09/2015 Primary Care Physican: Gennette Pac, MD Primary Cardiologist: Caryl Comes Electrophysiologist: Caryl Comes Dry Weight: 231 lb   Bi-V Pacing 99%      In the past month, have you:  1. Gained more than 2 pounds in a day or more than 5 pounds in a week? no  2. Had changes in your medications (with verification of current medications)? no  3. Had more shortness of breath than is usual for you? no  4. Limited your activity because of shortness of breath? no  5. Not been able to sleep because of shortness of breath? no  6. Had increased swelling in your feet or ankles? no  7. Had symptoms of dehydration (dizziness, dry mouth, increased thirst, decreased urine output) no  8. Had changes in sodium restriction? no  9. Been compliant with medication? Yes   ICM trend: 3 month view for 06/09/2015    ICM trend: 1 year view for 06/09/2015   Follow-up plan: ICM clinic phone appointment on 07/10/2015.  Thoracic impedance trending along reference line suggesting stable fluid levels.  Education given to limit sodium intake to < 2000 mg and fluid intake to 64 oz daily.  Encouraged to call for any fluid symptoms.  No changes today.    Rosalene Billings, RN, CCM 06/09/2015 3:32 PM

## 2015-06-09 NOTE — Progress Notes (Signed)
Remote ICD transmission.   

## 2015-06-11 ENCOUNTER — Encounter: Payer: Self-pay | Admitting: *Deleted

## 2015-06-11 ENCOUNTER — Other Ambulatory Visit: Payer: Self-pay | Admitting: *Deleted

## 2015-06-11 DIAGNOSIS — I5022 Chronic systolic (congestive) heart failure: Secondary | ICD-10-CM

## 2015-06-13 ENCOUNTER — Other Ambulatory Visit: Payer: Self-pay

## 2015-07-04 IMAGING — CR DG CHEST 2V
2 series · 2 of 2 positions shown · non-contrast
Comparison: 02/22/2014.

CLINICAL DATA: Chest pain.  Surgery for paraganglioma.

EXAM:
CHEST  2 VIEW

[w chest pa]
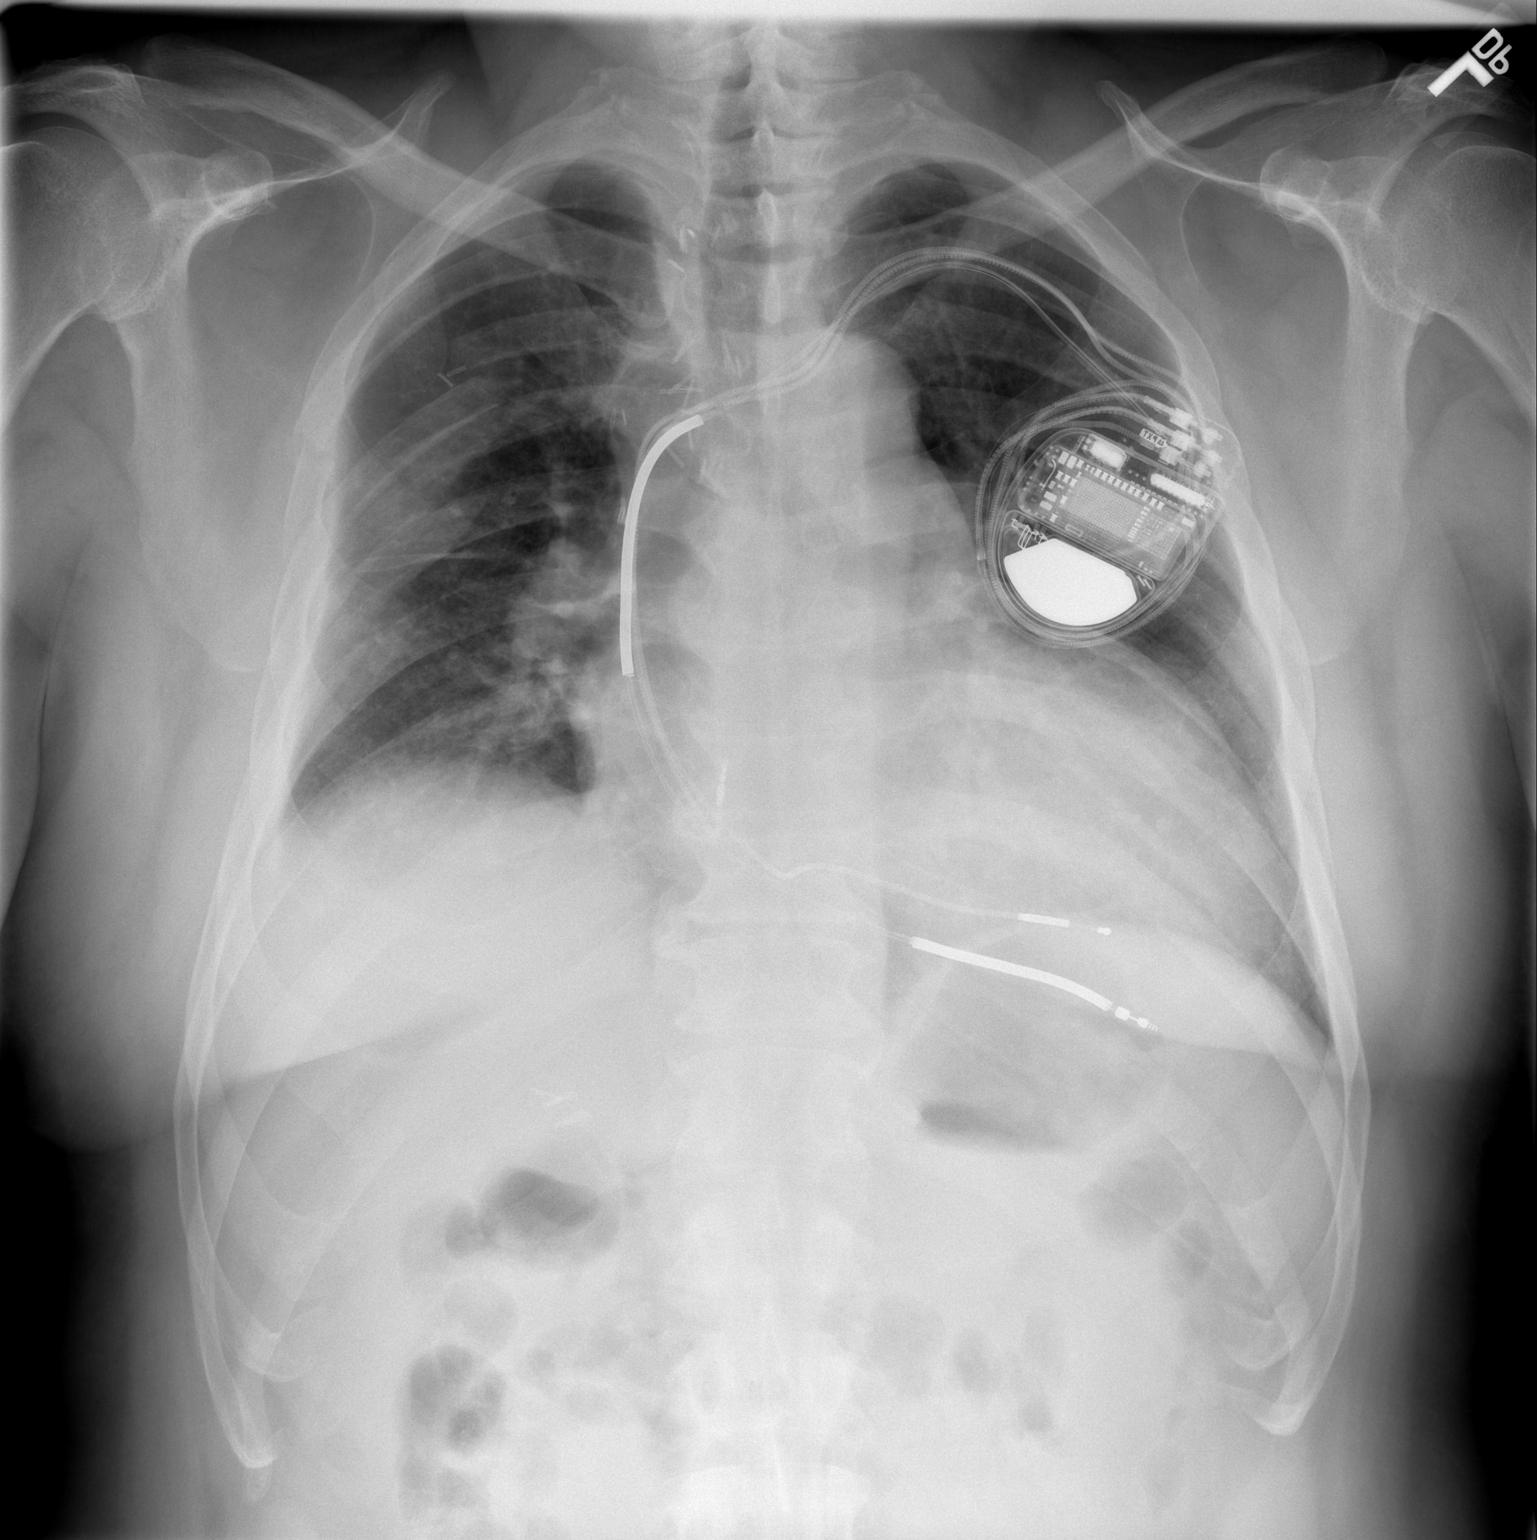

[w chest lat]
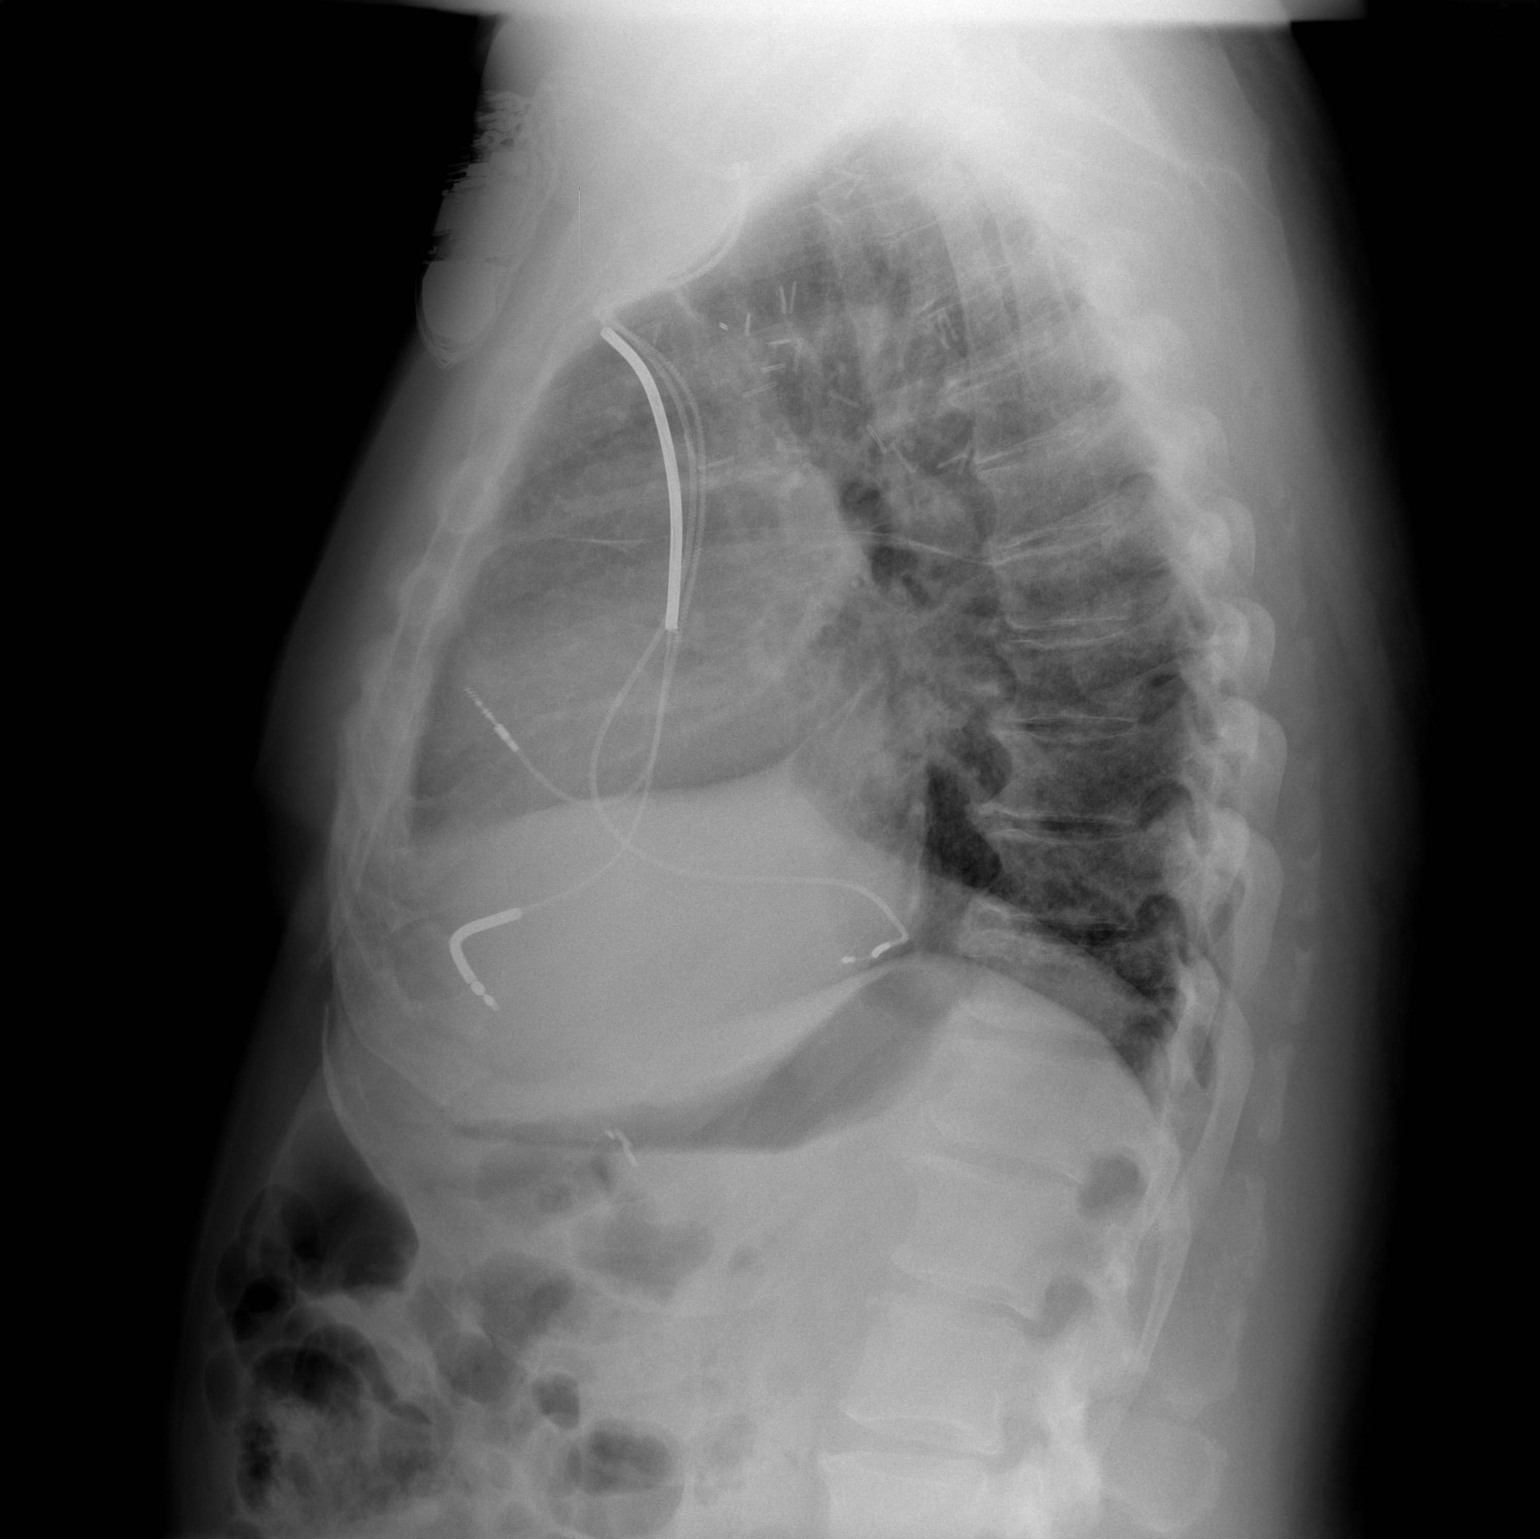

[2 of 2 positions shown; findings below may reference images not displayed]

FINDINGS: Interim removal of right central line and right chest staples.
Mediastinum and hilar structures are stable. Surgical clips in the
mediastinum again noted. Stable cardiomegaly with normal pulmonary
vascularity. Cardiac pacer in stable position. No focal pulmonary
infiltrate. Stable right rib fractures. Degenerative changes
throughout spine. Surgical clips right upper quadrant.
IMPRESSION: 1. Interval removal right central line and right chest surgical
staples. Stable postsurgical changes upper mediastinum.
2. Stable cardiomegaly. No CHF. Cardiac pacer stable position. No
acute pulmonary disease.
3. Stable right rib fractures, no pneumothorax.

## 2015-07-10 ENCOUNTER — Ambulatory Visit (INDEPENDENT_AMBULATORY_CARE_PROVIDER_SITE_OTHER): Payer: 59

## 2015-07-10 DIAGNOSIS — Z95810 Presence of automatic (implantable) cardiac defibrillator: Secondary | ICD-10-CM

## 2015-07-10 DIAGNOSIS — I5022 Chronic systolic (congestive) heart failure: Secondary | ICD-10-CM | POA: Diagnosis not present

## 2015-07-10 LAB — CUP PACEART REMOTE DEVICE CHECK
Brady Statistic AP VP Percent: 0.03 %
Brady Statistic AP VS Percent: 0.01 %
Brady Statistic AS VP Percent: 99.65 %
Brady Statistic RA Percent Paced: 0.04 %
Date Time Interrogation Session: 20170404062707
HIGH POWER IMPEDANCE MEASURED VALUE: 53 Ohm
HIGH POWER IMPEDANCE MEASURED VALUE: 65 Ohm
Implantable Lead Implant Date: 20100205
Implantable Lead Location: 753859
Implantable Lead Model: 5076
Lead Channel Impedance Value: 285 Ohm
Lead Channel Impedance Value: 361 Ohm
Lead Channel Impedance Value: 456 Ohm
Lead Channel Impedance Value: 475 Ohm
Lead Channel Impedance Value: 589 Ohm
Lead Channel Pacing Threshold Amplitude: 0.5 V
Lead Channel Pacing Threshold Pulse Width: 0.4 ms
Lead Channel Sensing Intrinsic Amplitude: 19.25 mV
Lead Channel Sensing Intrinsic Amplitude: 19.25 mV
Lead Channel Setting Pacing Amplitude: 1.75 V
Lead Channel Setting Pacing Amplitude: 2 V
Lead Channel Setting Pacing Amplitude: 2.5 V
Lead Channel Setting Pacing Pulse Width: 0.4 ms
Lead Channel Setting Pacing Pulse Width: 0.4 ms
Lead Channel Setting Sensing Sensitivity: 0.6 mV
MDC IDC LEAD IMPLANT DT: 20080829
MDC IDC LEAD IMPLANT DT: 20100205
MDC IDC LEAD LOCATION: 753858
MDC IDC LEAD LOCATION: 753860
MDC IDC LEAD MODEL: 4194
MDC IDC MSMT BATTERY REMAINING LONGEVITY: 54 mo
MDC IDC MSMT BATTERY VOLTAGE: 2.98 V
MDC IDC MSMT LEADCHNL LV PACING THRESHOLD AMPLITUDE: 0.75 V
MDC IDC MSMT LEADCHNL LV PACING THRESHOLD PULSEWIDTH: 0.4 ms
MDC IDC MSMT LEADCHNL RA IMPEDANCE VALUE: 456 Ohm
MDC IDC MSMT LEADCHNL RA SENSING INTR AMPL: 2.125 mV
MDC IDC MSMT LEADCHNL RA SENSING INTR AMPL: 2.125 mV
MDC IDC MSMT LEADCHNL RV PACING THRESHOLD AMPLITUDE: 0.75 V
MDC IDC MSMT LEADCHNL RV PACING THRESHOLD PULSEWIDTH: 0.4 ms
MDC IDC STAT BRADY AS VS PERCENT: 0.31 %
MDC IDC STAT BRADY RV PERCENT PACED: 98.98 %

## 2015-07-10 NOTE — Progress Notes (Signed)
EPIC Encounter for ICM Monitoring  Patient Name: [name] is a 55 y.o. male Date: 07/10/2015 Primary Care Physican: Gennette Pac, MD Primary Cardiologist: Caryl Comes Electrophysiologist: Caryl Comes Dry Weight: 231 lb   Bi-V Pacing 99.6%      In the past month, have you:  1. Gained more than 2 pounds in a day or more than 5 pounds in a week? no  2. Had changes in your medications (with verification of current medications)? no  3. Had more shortness of breath than is usual for you? no  4. Limited your activity because of shortness of breath? no  5. Not been able to sleep because of shortness of breath? no  6. Had increased swelling in your feet or ankles? No, but has stomach swelling  7. Had symptoms of dehydration (dizziness, dry mouth, increased thirst, decreased urine output) no  8. Had changes in sodium restriction? no  9. Been compliant with medication? Yes  ICM trend: 3 month view for 07/10/2015  ICM trend: 1 year view for 07/10/2015  Follow-up plan: ICM clinic phone appointment 08/12/2015.    FLUID LEVELS: Optivol thoracic impedance decreased 07/03/2015 to 07/10/2015 suggesting fluid accumulation and trending toward baseline 07/08/2015.    SYMPTOMS:  Belly bloating.  He reported taking extra Torsemide today for bloating and will take extra one tomorrow.  He reported for the last week he has eaten a lot of foods high in sodium, such as Asian food, and knows this is causing the fluid accumulation.   EDUCATION:   Education given asian foods are usually high in sodium and MSG which should be avoided.   Dr. Marval Regal advised patient to avoid high potassium foods such as baked potato and bananas.  Advised to limit sodium intake to < 2000 mg and fluid intake to 64 oz daily.   No changes today.    He reported he received a letter from Dr Caryl Comes that he needed to have magnesium drawn but one was drawn at Dr Marval Regal office in April.  I stated I would call the office to get results.       Received faxed copy of lab results from Dr [facility] office that was completed 06/26/2015.   06/26/2015 Creatinine 1.55, BUN 27, Potassium 5.3, eGFR 49, Magnesium 2.0   Patient has adjusted Torsemide for fluid symptoms.    Advised will send to Dr. Caryl Comes for review regarding symptoms and decreased thoracic impedance.  If any recommendations, will call back.    Rosalene Billings, RN, CCM 07/10/2015 1:02 PM

## 2015-07-13 ENCOUNTER — Encounter: Payer: Self-pay | Admitting: Internal Medicine

## 2015-07-15 ENCOUNTER — Encounter: Payer: Self-pay | Admitting: Cardiology

## 2015-07-28 ENCOUNTER — Other Ambulatory Visit (HOSPITAL_COMMUNITY): Payer: Self-pay | Admitting: *Deleted

## 2015-07-28 MED ORDER — MITIGARE 0.6 MG PO CAPS: 0.6000 mg | ORAL_CAPSULE | Freq: Every day | ORAL | Status: DC

## 2015-07-29 ENCOUNTER — Encounter: Payer: Self-pay | Admitting: Cardiology

## 2015-08-12 ENCOUNTER — Ambulatory Visit (INDEPENDENT_AMBULATORY_CARE_PROVIDER_SITE_OTHER): Payer: 59

## 2015-08-12 DIAGNOSIS — Z95810 Presence of automatic (implantable) cardiac defibrillator: Secondary | ICD-10-CM

## 2015-08-12 DIAGNOSIS — I5022 Chronic systolic (congestive) heart failure: Secondary | ICD-10-CM

## 2015-08-12 NOTE — Progress Notes (Signed)
EPIC Encounter for ICM Monitoring  Patient Name: [name] is a 55 y.o. male Date: 08/12/2015 Primary Care Physican: Donetta Potts, MD Primary Cardiologist: Caryl Comes Electrophysiologist: Caryl Comes Dry Weight: unknown   Bi-V Pacing 98.6%      In the past month, have you:  1. Gained more than 2 pounds in a day or more than 5 pounds in a week? N/A  2. Had changes in your medications (with verification of current medications)? N/A  3. Had more shortness of breath than is usual for you? N/A  4. Limited your activity because of shortness of breath? N/A  5. Not been able to sleep because of shortness of breath? N/A  6. Had increased swelling in your feet or ankles? N/A  7. Had symptoms of dehydration (dizziness, dry mouth, increased thirst, decreased urine output) N/A  8. Had changes in sodium restriction? N/A  9. Been compliant with medication? N/A   ICM trend: 3 month view for 08/12/2015   ICM trend: 1 year view for 08/12/2015   Follow-up plan: ICM clinic phone appointment on 09/16/2015.  Attempted call to patient and unable to reach.  Transmission reviewed.  Thoracic impedance trending along baseline suggesting stable fluid levels.     Rosalene Billings, RN, CCM 08/12/2015 2:52 PM

## 2015-09-03 ENCOUNTER — Other Ambulatory Visit (HOSPITAL_COMMUNITY): Payer: Self-pay | Admitting: Internal Medicine

## 2015-09-09 ENCOUNTER — Other Ambulatory Visit (HOSPITAL_COMMUNITY): Payer: Self-pay | Admitting: Adult Health

## 2015-09-09 ENCOUNTER — Ambulatory Visit (INDEPENDENT_AMBULATORY_CARE_PROVIDER_SITE_OTHER): Payer: 59 | Admitting: *Deleted

## 2015-09-09 DIAGNOSIS — I255 Ischemic cardiomyopathy: Secondary | ICD-10-CM | POA: Diagnosis not present

## 2015-09-09 DIAGNOSIS — Z95810 Presence of automatic (implantable) cardiac defibrillator: Secondary | ICD-10-CM

## 2015-09-09 NOTE — Progress Notes (Signed)
Remote ICD transmission.   

## 2015-09-10 ENCOUNTER — Other Ambulatory Visit: Payer: Self-pay | Admitting: Internal Medicine

## 2015-09-11 LAB — CUP PACEART REMOTE DEVICE CHECK
Battery Voltage: 2.97 V
Brady Statistic AS VP Percent: 98.99 %
Brady Statistic RA Percent Paced: 0.06 %
Date Time Interrogation Session: 20170705062608
HIGH POWER IMPEDANCE MEASURED VALUE: 61 Ohm
HighPow Impedance: 50 Ohm
Implantable Lead Implant Date: 20080829
Implantable Lead Implant Date: 20100205
Implantable Lead Location: 753858
Implantable Lead Model: 4194
Implantable Lead Model: 5076
Lead Channel Impedance Value: 285 Ohm
Lead Channel Impedance Value: 456 Ohm
Lead Channel Impedance Value: 589 Ohm
Lead Channel Pacing Threshold Amplitude: 0.5 V
Lead Channel Pacing Threshold Pulse Width: 0.4 ms
Lead Channel Pacing Threshold Pulse Width: 0.4 ms
Lead Channel Pacing Threshold Pulse Width: 0.4 ms
Lead Channel Sensing Intrinsic Amplitude: 1.875 mV
Lead Channel Sensing Intrinsic Amplitude: 19.375 mV
Lead Channel Setting Pacing Amplitude: 2 V
Lead Channel Setting Sensing Sensitivity: 0.6 mV
MDC IDC LEAD IMPLANT DT: 20100205
MDC IDC LEAD LOCATION: 753859
MDC IDC LEAD LOCATION: 753860
MDC IDC MSMT BATTERY REMAINING LONGEVITY: 49 mo
MDC IDC MSMT LEADCHNL LV IMPEDANCE VALUE: 456 Ohm
MDC IDC MSMT LEADCHNL LV PACING THRESHOLD AMPLITUDE: 0.75 V
MDC IDC MSMT LEADCHNL RA SENSING INTR AMPL: 1.875 mV
MDC IDC MSMT LEADCHNL RV IMPEDANCE VALUE: 342 Ohm
MDC IDC MSMT LEADCHNL RV IMPEDANCE VALUE: 456 Ohm
MDC IDC MSMT LEADCHNL RV PACING THRESHOLD AMPLITUDE: 0.75 V
MDC IDC MSMT LEADCHNL RV SENSING INTR AMPL: 19.375 mV
MDC IDC SET LEADCHNL LV PACING AMPLITUDE: 1.75 V
MDC IDC SET LEADCHNL LV PACING PULSEWIDTH: 0.4 ms
MDC IDC SET LEADCHNL RV PACING AMPLITUDE: 2.5 V
MDC IDC SET LEADCHNL RV PACING PULSEWIDTH: 0.4 ms
MDC IDC STAT BRADY AP VP PERCENT: 0.04 %
MDC IDC STAT BRADY AP VS PERCENT: 0.01 %
MDC IDC STAT BRADY AS VS PERCENT: 0.95 %
MDC IDC STAT BRADY RV PERCENT PACED: 98.31 %

## 2015-09-16 ENCOUNTER — Encounter: Payer: Self-pay | Admitting: Cardiology

## 2015-09-17 ENCOUNTER — Encounter: Payer: Self-pay | Admitting: Internal Medicine

## 2015-09-17 ENCOUNTER — Telehealth: Payer: Self-pay | Admitting: Internal Medicine

## 2015-09-17 NOTE — Telephone Encounter (Signed)
Replied to patient via MyChart. 

## 2015-09-17 NOTE — Telephone Encounter (Signed)
New message      Patient said "please look at message sent thru mychart".

## 2015-09-21 ENCOUNTER — Telehealth: Payer: Self-pay | Admitting: Cardiology

## 2015-09-21 NOTE — Progress Notes (Signed)
Remote ICM transmission rescheduled from 09/21/2015 to 10/22/2015 due to patient has office appointment on 09/22/2015 for defib check.

## 2015-09-21 NOTE — Telephone Encounter (Signed)
Pt doesn't need to send a remote transmission today b/c he has an appt w/ MD on Tuesday.

## 2015-09-22 ENCOUNTER — Ambulatory Visit (INDEPENDENT_AMBULATORY_CARE_PROVIDER_SITE_OTHER): Payer: 59 | Admitting: Internal Medicine

## 2015-09-22 ENCOUNTER — Encounter: Payer: Self-pay | Admitting: Internal Medicine

## 2015-09-22 ENCOUNTER — Other Ambulatory Visit: Payer: Self-pay

## 2015-09-22 VITALS — BP 132/100 | HR 93 | Ht 70.0 in | Wt 234.6 lb

## 2015-09-22 DIAGNOSIS — Z95810 Presence of automatic (implantable) cardiac defibrillator: Secondary | ICD-10-CM

## 2015-09-22 DIAGNOSIS — I472 Ventricular tachycardia: Secondary | ICD-10-CM | POA: Diagnosis not present

## 2015-09-22 DIAGNOSIS — R000 Tachycardia, unspecified: Secondary | ICD-10-CM

## 2015-09-22 DIAGNOSIS — I5022 Chronic systolic (congestive) heart failure: Secondary | ICD-10-CM | POA: Diagnosis not present

## 2015-09-22 DIAGNOSIS — I429 Cardiomyopathy, unspecified: Secondary | ICD-10-CM

## 2015-09-22 DIAGNOSIS — I428 Other cardiomyopathies: Secondary | ICD-10-CM

## 2015-09-22 DIAGNOSIS — I4729 Other ventricular tachycardia: Secondary | ICD-10-CM

## 2015-09-22 LAB — CUP PACEART INCLINIC DEVICE CHECK
Battery Voltage: 2.95 V
Brady Statistic AP VP Percent: 0.07 %
Brady Statistic AP VS Percent: 0.01 %
Brady Statistic AS VS Percent: 3.04 %
HIGH POWER IMPEDANCE MEASURED VALUE: 51 Ohm
HIGH POWER IMPEDANCE MEASURED VALUE: 61 Ohm
Implantable Lead Implant Date: 20100205
Implantable Lead Location: 753859
Implantable Lead Model: 6947
Lead Channel Impedance Value: 342 Ohm
Lead Channel Impedance Value: 456 Ohm
Lead Channel Impedance Value: 475 Ohm
Lead Channel Impedance Value: 475 Ohm
Lead Channel Impedance Value: 608 Ohm
Lead Channel Pacing Threshold Amplitude: 0.5 V
Lead Channel Pacing Threshold Amplitude: 0.75 V
Lead Channel Pacing Threshold Amplitude: 0.75 V
Lead Channel Pacing Threshold Pulse Width: 0.4 ms
Lead Channel Pacing Threshold Pulse Width: 0.4 ms
Lead Channel Sensing Intrinsic Amplitude: 17.25 mV
Lead Channel Setting Pacing Amplitude: 2.5 V
Lead Channel Setting Pacing Pulse Width: 0.4 ms
Lead Channel Setting Pacing Pulse Width: 0.4 ms
MDC IDC LEAD IMPLANT DT: 20080829
MDC IDC LEAD IMPLANT DT: 20100205
MDC IDC LEAD LOCATION: 753858
MDC IDC LEAD LOCATION: 753860
MDC IDC LEAD MODEL: 4194
MDC IDC MSMT BATTERY REMAINING LONGEVITY: 48 mo
MDC IDC MSMT LEADCHNL LV IMPEDANCE VALUE: 304 Ohm
MDC IDC MSMT LEADCHNL RA PACING THRESHOLD PULSEWIDTH: 0.4 ms
MDC IDC MSMT LEADCHNL RA SENSING INTR AMPL: 2.375 mV
MDC IDC MSMT LEADCHNL RA SENSING INTR AMPL: 2.875 mV
MDC IDC MSMT LEADCHNL RV SENSING INTR AMPL: 18.375 mV
MDC IDC SESS DTM: 20170718154443
MDC IDC SET LEADCHNL LV PACING AMPLITUDE: 1.75 V
MDC IDC SET LEADCHNL RA PACING AMPLITUDE: 2 V
MDC IDC SET LEADCHNL RV SENSING SENSITIVITY: 0.6 mV
MDC IDC STAT BRADY AS VP PERCENT: 96.88 %
MDC IDC STAT BRADY RA PERCENT PACED: 0.08 %
MDC IDC STAT BRADY RV PERCENT PACED: 95.51 %

## 2015-09-22 MED ORDER — MAGNESIUM OXIDE 400 MG PO TABS: ORAL_TABLET | ORAL | Status: DC

## 2015-09-22 MED ORDER — CARVEDILOL 6.25 MG PO TABS: 6.2500 mg | ORAL_TABLET | Freq: Two times a day (BID) | ORAL | Status: DC

## 2015-09-22 NOTE — Progress Notes (Signed)
Patient Care Team: Donato Heinz, MD as PCP - General (Nephrology) Deboraha Sprang, MD as PCP - Cardiology (Cardiology) Collene Gobble, MD as Consulting Physician (Pulmonary Disease) Beryle Lathe, MD (General Surgery)   HPI  [name] is a 55 y.o. male is seen in followup for congestive heart failure in the setting of nonischemic cardiomyopathy. He is status post CRT-D. implantation. He had been previously treated for VT with amiodarone which was discontinued because of lightheadedness He was seen in jan 2103 with flurries of VT requiring therapy which I had hoped might be attributable to mechanical-electric issues aggravated by CHF   He was hospitalized again 11/13 for multiple inappropriate ICD shocks attributed to hypokalemia secondary acute renal insufficiency and T wave oversensing.  The device was reprogrammed to decrease sensitivity from 0.3-0.6 mV and the NID was increased.   He also has OSA on CPAP. 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  No edema  Hosp 11/16 A/C CHF in setting of holding diuretics 2/2 gout flare     Past Medical History  Diagnosis Date  . Gout 08/03/12    PT C/O OF RIGHT KNEE PAIN AND SWELLING -STATES GOUT FLARE UP - ONGOING SINCE APRIL - BUT SWELLING W/IN LAST WEEK  . Cardiomyopathy     Idiopathic dilated;   Marland Kitchen Ventricular tachycardia ([place])     s/p ICD  . Sleep apnea     CPAP  . Systolic CHF ([place])     EF 15%  . CKD (chronic kidney disease)     stage III baseline Crt 1.8-2.0  . Biventricular ICD (implantable cardiac defibrillator) Medtronic ]     DOI 2008/ upgrade 2010/ Gen Change 2014  . Automatic implantable  cardioverter-defibrillator in situ   . CHF (congestive heart failure) ([place])   . Bladder tumor     PT HOSP AT [facility] 4/24 TO 4/26 2014 WITH UTI--AND FOUND TO HAVE BLADDER TUMOR-  . Arthritis     GOUT  . Hilar mass     Noted CT 2010  . HTN (hypertension)     Dr. Caryl Comes cardiologist  . Coronary artery disease   . Hypothyroidism   . 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 Cholecystectomy    . Cardiac catheterization      he was found to have normal coronary arteries but with a globally dilated and hypocontractile heart  . US echocardiography  03-17-2008, 07-03-2006    EF 15-20%, EF 15-20%  . Transurethral resection of bladder tumor N/A 08/13/2012    Procedure: TRANSURETHRAL RESECTION OF BLADDER TUMOR (TURBT);  Surgeon: Claybon Jabs, MD;  Location: [facility] ORS;  Service: Urology;  Laterality: N/A;  MITOMYCIN C  . Cardiac defibrillator placement      DOI 2008/ upgrade 2010/ Gen Change 2014   . Endobronchial ultrasound Bilateral 10/01/2012    Procedure: ENDOBRONCHIAL ULTRASOUND;  Surgeon: Collene Gobble, MD;  Location: [facility] ENDOSCOPY;  Service: Cardiopulmonary;  Laterality: Bilateral;  . Cornea replacement    . Video bronchoscopy N/A 10/25/2012    Procedure: VIDEO BRONCHOSCOPY;  Surgeon: [name], MD;  Location: [place] OR;  Service: Thoracic;  Laterality: N/A;  . Mediastinoscopy N/A 10/25/2012    Procedure: MEDIASTINOSCOPY;  Surgeon: [name], MD;  Location: [place];  Service: Thoracic;  Laterality: N/A;  . Tonsillectomy    . Mediastinotomy  09/10/2013    paratracheal mass    DR VANTRIGT  . Chest tube insertion  09/10/2013  . Mediastinotomy chamberlain mcneil Right 09/10/2013    Procedure: MEDIASTINOTOMY CHAMBERLAIN MCNEIL;  Surgeon: [name], MD;  Location: [place];  Service: Thoracic;  Laterality: Right;  . Biv icd genertaor change out      DOI 2008/ upgrade 2010/ Gen Change 2014    . Right heart catheterization N/A 01/27/2012    Procedure: RIGHT HEART CATH;  Surgeon: Jolaine Artist, MD;  Location: [facility] CATH LAB;  Service: Cardiovascular;  Laterality: N/A;  . Implantable cardioverter defibrillator (icd) generator change N/A 05/02/2012    Procedure: ICD GENERATOR CHANGE;  Surgeon: Deboraha Sprang, MD;  Location: [facility] CATH LAB;  Service: Cardiovascular;  Laterality: N/A;  . Video assisted thoracoscopy (vats)/thorocotomy Right 02/13/2014    Procedure: VIDEO ASSISTED THORACOSCOPY (VATS)/THOROCOTOMY;  Surgeon: [name], MD;  Location: [facility] OR;  Service: Thoracic;  Laterality: Right;  PATIENT NEEDS EPIDURAL CATHETER (DR. CHRIS MOSER AWARE PER PVT)  . Resection of mediastinal mass Right 02/13/2014    Procedure: RESECTION OF MEDIASTINAL MASS;  Surgeon: [name], MD;  Location: [facility] OR;  Service: Thoracic;  Laterality: Right;  PATIENT NEEDS EPIDURAL CATHETER AND A SWAN GANZ CATHETER (DR. CHRIS MOSER AWARE PER PVT)    Current Outpatient Prescriptions  Medication Sig Dispense Refill  . atorvastatin (LIPITOR) 40 MG tablet TAKE 1 TABLET BY MOUTH AT BEDTIME 30 tablet 2  . BREO ELLIPTA 100-25 MCG/INH AEPB Inhale 1 puff into the lungs daily.  11  . carvedilol (COREG) 3.125 MG tablet TAKE 1 TABLET (3.125 MG TOTAL) BY MOUTH 2 (TWO) TIMES DAILY WITH A MEAL. 60 tablet 3  . ciprofloxacin (CIPRO) 500 MG tablet Take 1 tablet by mouth 2 (two) times daily.    . digoxin (LANOXIN) 0.25 MG tablet TAKE 1/2 TAB (0.125 MG TOTAL) BY MOUTH DAILY. 15 tablet 3  . Febuxostat (ULORIC) 80 MG TABS Take 80 mg by mouth at bedtime.    . gabapentin (NEURONTIN) 300 MG capsule Take 1 capsule by mouth daily as needed. Pain    . HYDROcodone-acetaminophen (NORCO/VICODIN) 5-325 MG tablet Take 1 tablet by mouth every 6 (six) hours as needed for moderate pain. 30 tablet 0  . ivabradine (CORLANOR) 5 MG TABS tablet Take 5 mg by mouth daily.     Marland Kitchen losartan (COZAAR) 25 MG tablet TAKE 1 TABLET (25 MG TOTAL) BY MOUTH  DAILY. 30 tablet 6  . magnesium oxide (MAG-OX) 400 MG tablet Take two tablets (800 mg) by mouth twice daily    . MITIGARE 0.6 MG CAPS Take 0.6 mg by mouth daily. 30 capsule 6  . tobramycin-dexamethasone (TOBRADEX) ophthalmic solution Place 2 drops into the right eye 2 (two) times daily.  5  . torsemide (DEMADEX) 10 MG tablet Take 10 mg by mouth daily.      No current facility-administered medications for this visit.    Allergies  Allergen Reactions  . Bidil [Isosorb Dinitrate-Hydralazine]     Headaches   . Spironolactone     Hyperkalemia   . Hydralazine Hcl     Fuzzy headed    Review of Systems negative except from HPI and PMH  Physical Exam  BP 132/100 mmHg  Pulse 93  Ht 5\' 10"  (1.778 m)  Wt 234 lb 9.6 oz (106.414 kg)  BMI 33.66 kg/m2  SpO2 99% Well developed and well nourished in no acute distress HENT normal E scleral and icterus clear Neck Supple JVP 8-10 cm; carotids brisk and full Clear to ausculation  Regular rate and rhythm, no murmurs gallops or rub Soft with active bowel sounds; fall  No clubbing cyanosis none Edema Alert and oriented, grossly normal motor and sensory function Skin Warm and Dry   ECG P. synchronous biventricular pacing   Assessment and  Plan  Nonischemic cardiomyopathy   Ventricular tachycardia-paroxysmal  Sinus tachycardia  Congestive heart failure-chronic-systolic   Implantable defibrillator-CRT-Medtronic The patient's device was interrogated.  The information was reviewed. No changes were made in the programming.     Renal insufficiency-grade 3   Diaphragmatic paralysis   We will reassess left ventricular function  His functional status is pretty good actually not withstanding his previously reported EF  Given his complaints about the pain, I would recommend if the surgeon is extremely confident that her relative symptoms will be addressed, and seemed in conjunction with the [facility] heart failure team in anticipation of  surgery makes sense. The other hand the likelihood of improvement is scant, I'm not sure is worth any risk at all    With elevated HR and Pulse will increase carvediolol;  He is reminded taht ivabradine is BID

## 2015-09-22 NOTE — Patient Instructions (Addendum)
Medication Instructions: - Your physician has recommended you make the following change in your medication:  1) Increase coreg (carvedilol) to 6.25 mg one tablet by mouth twice daily  Labwork: - none  Procedures/Testing: - none  Follow-Up: - Remote monitoring is used to monitor your Pacemaker of ICD from home. This monitoring reduces the number of office visits required to check your device to one time per year. It allows [place] to keep an eye on the functioning of your device to ensure it is working properly. You are scheduled for a device check from home on 10/23/15 (fluid check only) & 12/22/15 (full transmission). You may send your transmission at any time that day. If you have a wireless device, the transmission will be sent automatically. After your physician reviews your transmission, you will receive a postcard with your next transmission date.  - Your physician wants you to follow-up in: 6 months with Dr. Caryl Comes. You will receive a reminder letter in the mail two months in advance. If you don't receive a letter, please call our office to schedule the follow-up appointment.  Any Additional Special Instructions Will Be Listed Below (If Applicable).     If you need a refill on your cardiac medications before your next appointment, please call your pharmacy.

## 2015-10-02 ENCOUNTER — Other Ambulatory Visit: Payer: Self-pay | Admitting: Adult Health

## 2015-10-23 ENCOUNTER — Ambulatory Visit (INDEPENDENT_AMBULATORY_CARE_PROVIDER_SITE_OTHER): Payer: 59

## 2015-10-23 DIAGNOSIS — I5022 Chronic systolic (congestive) heart failure: Secondary | ICD-10-CM

## 2015-10-23 DIAGNOSIS — Z95810 Presence of automatic (implantable) cardiac defibrillator: Secondary | ICD-10-CM | POA: Diagnosis not present

## 2015-10-23 NOTE — Progress Notes (Signed)
EPIC Encounter for ICM Monitoring  Patient Name: [name] is a 55 y.o. male Date: 10/23/2015 Primary Care Physican: Donetta Potts, MD Primary Cardiologist: Caryl Comes Electrophysiologist: Caryl Comes Dry Weight:  unknown Bi-V Pacing:  93.9%       Heart Failure questions reviewed, pt asymptomatic   Thoracic impedance normal.  Recommendations: No changes.  Low sodium diet education provided.    Follow-up plan: ICM clinic phone appointment on 11/23/2015.  Copy of ICM check sent to device physician.   ICM trend: 10/23/2015       Rosalene Billings, RN 10/23/2015 8:55 AM

## 2015-11-06 ENCOUNTER — Other Ambulatory Visit (HOSPITAL_COMMUNITY): Payer: Self-pay | Admitting: Internal Medicine

## 2015-11-23 ENCOUNTER — Ambulatory Visit (INDEPENDENT_AMBULATORY_CARE_PROVIDER_SITE_OTHER): Payer: 59

## 2015-11-23 DIAGNOSIS — Z95810 Presence of automatic (implantable) cardiac defibrillator: Secondary | ICD-10-CM

## 2015-11-23 DIAGNOSIS — I5022 Chronic systolic (congestive) heart failure: Secondary | ICD-10-CM

## 2015-11-23 NOTE — Progress Notes (Signed)
EPIC Encounter for ICM Monitoring  Patient Name: [name] is a 55 y.o. male Date: 11/23/2015 Primary Care Physican: Donetta Potts, MD Primary [place] Electrophysiologist: Caryl Comes Dry Weight: unknown Bi-V Pacing:  94.3%       Heart Failure questions reviewed, pt symptomatic with some shortness of breath.   Patient had heart cath at [facility] 11/05/2015.  He was told the first week of September by [facility] he had fluid accumulation and was instructed to start taking Torsemide 10 mg daily since 11/19/2015 instead of every other day.    Thoracic impedance abnormal suggesting fluid accumulation.   Advised he continues to have fluid accumulating even after taking the Torsemide 10 mg daily since 11/19/2015.  LABS:  Next labs will be drawn 11/26/2015 at [facility]  11/19/2015 Creatinine 1.55, BUN 32, Potassium 5.1, Sodium 135  BNP 378 - Care Everywhere report - [facility] 11/05/2015 Creatinine 1.61, BUN 28, Potassium 4.8, Sodium 141 - Care Everywhere report [facility] 06/26/2015 Creatinine 1.55, BUN 27, Potassium 5.3, Sodium 136 Per Care Everywhere report: Creatinine has ranged from 1.34 to 1.70 during 09/11/2015 to 11/19/2015.   Recommendations: Recommendations have been given per [facility] and he is follow up with them again.  Advised to limit salt intake.       Follow-up plan: ICM clinic phone appointment on 11/27/2015 to recheck fluid levels.  Copy of ICM check sent to device physician.   ICM trend: 11/23/2015       Rosalene Billings, RN 11/23/2015 12:37 PM

## 2015-11-27 ENCOUNTER — Ambulatory Visit (INDEPENDENT_AMBULATORY_CARE_PROVIDER_SITE_OTHER): Payer: 59

## 2015-11-27 DIAGNOSIS — Z95810 Presence of automatic (implantable) cardiac defibrillator: Secondary | ICD-10-CM

## 2015-11-27 DIAGNOSIS — I5022 Chronic systolic (congestive) heart failure: Secondary | ICD-10-CM

## 2015-11-27 NOTE — Progress Notes (Signed)
EPIC Encounter for ICM Monitoring  Patient Name: EULON LANMAN is a 55 y.o. male Date: 11/27/2015 Primary Care Physican: Donetta Potts, MD Primary [place] Electrophysiologist: Caryl Comes Dry Weight:    unknown Bi-V Pacing:  88.2%   (decrease from 94.3% on 11/23/2015)            Heart Failure questions reviewed, pt asymptomatic   Thoracic impedance returned to normal.  He is back to taking Torsemide every other day.    LABS:    11/26/2015 Creatinine 1.35, BUN 32, Potassium 4.9, Sodium 136 _ Care Everywhere - [facility]    11/19/2015 Creatinine 1.55, BUN 32, Potassium 5.1, Sodium 135  BNP 378 - Care Everywhere report - [facility] 11/05/2015 Creatinine 1.61, BUN 28, Potassium 4.8, Sodium 141 - Care Everywhere report [facility] 06/26/2015 Creatinine 1.55, BUN 27, Potassium 5.3, Sodium 136 Per Care Everywhere report: Creatinine has ranged from 1.34 to 1.70 during 09/11/2015 to 11/19/2015.  Recommendations: No changes.      Follow-up plan: ICM clinic phone appointment on 12/28/2015.  Copy of ICM check sent to device physician.   ICM trend: 11/27/2015       Rosalene Billings, RN 11/27/2015 1:24 PM

## 2015-12-09 ENCOUNTER — Encounter: Payer: 59 | Admitting: *Deleted

## 2015-12-28 ENCOUNTER — Ambulatory Visit (INDEPENDENT_AMBULATORY_CARE_PROVIDER_SITE_OTHER): Payer: 59 | Admitting: *Deleted

## 2015-12-28 DIAGNOSIS — Z95810 Presence of automatic (implantable) cardiac defibrillator: Secondary | ICD-10-CM

## 2015-12-28 DIAGNOSIS — I5042 Chronic combined systolic (congestive) and diastolic (congestive) heart failure: Secondary | ICD-10-CM | POA: Diagnosis not present

## 2015-12-28 DIAGNOSIS — I428 Other cardiomyopathies: Secondary | ICD-10-CM

## 2015-12-28 NOTE — Progress Notes (Signed)
Remote ICD transmission.   

## 2015-12-29 ENCOUNTER — Encounter: Payer: Self-pay | Admitting: Cardiology

## 2015-12-29 NOTE — Progress Notes (Signed)
EPIC Encounter for ICM Monitoring  Patient Name: [name] is a 55 y.o. male Date: 12/29/2015 Primary Care Physican: Donetta Potts, MD Primary [place] Electrophysiologist: Caryl Comes Dry Weight:230 lbs Bi-V Pacing: 92.1%  Heart Failure questions reviewed, pt asymptomatic   Thoracic impedance slightly below baseline suggesting small amount of fluid accumulation.  He reported skipping Torsemide dose in the last few days as well as eating out over the weekend.   LABS:   11/26/2015 Creatinine 1.35, BUN 32, Potassium 4.9, Sodium 136 _ Care Everywhere - [facility]    11/19/2015 Creatinine 1.55, BUN 32, Potassium 5.1, Sodium 135 BNP 378 - Care Everywhere report - [facility] 11/05/2015 Creatinine 1.61, BUN 28, Potassium 4.8, Sodium 141 - Care Everywhere report [facility] 06/26/2015 Creatinine 1.55, BUN 27, Potassium 5.3, Sodium 136 Per Care Everywhere report: Creatinine has ranged from 1.34 to 1.70 during 09/11/2015 to 11/19/2015.   Recommendations:  Recommended he should not skip dosages of Torsemide and review food labels for sodium amounts and limit to 2000 mg daily.  Encouraged to call for fluid symptoms.    Follow-up plan: ICM clinic phone appointment on 02/03/2016.  Copy of ICM check sent to device physician.   ICM trend: 12/28/2015       Rosalene Billings, RN 12/29/2015 9:26 AM

## 2016-01-06 ENCOUNTER — Telehealth (HOSPITAL_COMMUNITY): Payer: Self-pay | Admitting: *Deleted

## 2016-01-06 NOTE — Telephone Encounter (Signed)
Returned call to pt regarding message left earlier regarding cardiac rehab.  Asked pt to please call.  Contact information provided. Cherre Huger, BSN

## 2016-01-12 ENCOUNTER — Encounter: Payer: Self-pay | Admitting: Cardiology

## 2016-01-23 LAB — CUP PACEART REMOTE DEVICE CHECK
Battery Voltage: 2.97 V
Brady Statistic AP VP Percent: 0.04 %
Brady Statistic AS VP Percent: 94.83 %
Brady Statistic RA Percent Paced: 0.05 %
Brady Statistic RV Percent Paced: 92.14 %
HIGH POWER IMPEDANCE MEASURED VALUE: 48 Ohm
HIGH POWER IMPEDANCE MEASURED VALUE: 60 Ohm
Implantable Lead Implant Date: 20100205
Implantable Lead Location: 753859
Implantable Lead Model: 4194
Implantable Lead Model: 5076
Lead Channel Impedance Value: 285 Ohm
Lead Channel Impedance Value: 361 Ohm
Lead Channel Impedance Value: 418 Ohm
Lead Channel Impedance Value: 608 Ohm
Lead Channel Pacing Threshold Amplitude: 0.375 V
Lead Channel Pacing Threshold Pulse Width: 0.4 ms
Lead Channel Pacing Threshold Pulse Width: 0.4 ms
Lead Channel Pacing Threshold Pulse Width: 0.4 ms
Lead Channel Sensing Intrinsic Amplitude: 17 mV
Lead Channel Setting Pacing Amplitude: 2 V
Lead Channel Setting Pacing Amplitude: 2.5 V
Lead Channel Setting Pacing Pulse Width: 0.4 ms
Lead Channel Setting Sensing Sensitivity: 0.6 mV
MDC IDC LEAD IMPLANT DT: 20080829
MDC IDC LEAD IMPLANT DT: 20100205
MDC IDC LEAD LOCATION: 753858
MDC IDC LEAD LOCATION: 753860
MDC IDC MSMT BATTERY REMAINING LONGEVITY: 43 mo
MDC IDC MSMT LEADCHNL LV IMPEDANCE VALUE: 475 Ohm
MDC IDC MSMT LEADCHNL LV PACING THRESHOLD AMPLITUDE: 0.75 V
MDC IDC MSMT LEADCHNL RA SENSING INTR AMPL: 2.125 mV
MDC IDC MSMT LEADCHNL RA SENSING INTR AMPL: 2.125 mV
MDC IDC MSMT LEADCHNL RV IMPEDANCE VALUE: 418 Ohm
MDC IDC MSMT LEADCHNL RV PACING THRESHOLD AMPLITUDE: 0.75 V
MDC IDC MSMT LEADCHNL RV SENSING INTR AMPL: 17 mV
MDC IDC PG IMPLANT DT: 20140226
MDC IDC SESS DTM: 20171023062727
MDC IDC SET LEADCHNL LV PACING AMPLITUDE: 1.75 V
MDC IDC SET LEADCHNL RV PACING PULSEWIDTH: 0.4 ms
MDC IDC STAT BRADY AP VS PERCENT: 0.01 %
MDC IDC STAT BRADY AS VS PERCENT: 5.12 %

## 2016-01-23 NOTE — Progress Notes (Signed)
Normal remote reviewed.  Next follow up 03/2016 SK Also followed in [facility] clinic

## 2016-01-26 ENCOUNTER — Encounter (HOSPITAL_COMMUNITY)
Admission: RE | Admit: 2016-01-26 | Discharge: 2016-01-26 | Disposition: A | Discharge status: Home / Self Care | Payer: 59 | Source: Ambulatory Visit | Attending: Cardiology | Admitting: Cardiology

## 2016-01-26 ENCOUNTER — Encounter (HOSPITAL_COMMUNITY): Payer: Self-pay

## 2016-01-26 VITALS — BP 118/78 | HR 90 | Ht 70.0 in | Wt 239.4 lb

## 2016-01-26 DIAGNOSIS — I130 Hypertensive heart and chronic kidney disease with heart failure and stage 1 through stage 4 chronic kidney disease, or unspecified chronic kidney disease: Secondary | ICD-10-CM | POA: Insufficient documentation

## 2016-01-26 DIAGNOSIS — I5022 Chronic systolic (congestive) heart failure: Secondary | ICD-10-CM | POA: Insufficient documentation

## 2016-01-26 DIAGNOSIS — Z79899 Other long term (current) drug therapy: Secondary | ICD-10-CM | POA: Insufficient documentation

## 2016-01-26 DIAGNOSIS — N183 Chronic kidney disease, stage 3 (moderate): Secondary | ICD-10-CM | POA: Diagnosis not present

## 2016-01-26 NOTE — Progress Notes (Signed)
Cardiac Rehab Medication Review by a Pharmacist  Does the patient  feel that his/her medications are working for him/her?  yes  Has the patient been experiencing any side effects to the medications prescribed?  Yes - dizziness with torsemide  Does the patient measure his/her own blood pressure or blood glucose at home?  no   Does the patient have any problems obtaining medications due to transportation or finances?   no  Understanding of regimen: fair Understanding of indications: fair Potential of compliance: good    Pharmacist comments: Pt presents for initial cardiac rehab visit. Pt reports that he does not currently check his BP but has recently bought a watch that will monitor for him. Pt endorses abdominal pain that is intermittent - this has been evaluated by his PCP and will be treated surgically once his heart is healthier. Pt reports feeling dizzy after taking torsemide - counseled to sit and stand very carefully to avoid a fall.  Arrie Senate, PharmD PGY-1 Pharmacy Resident Pager: [phone] 01/26/2016

## 2016-01-27 NOTE — Progress Notes (Signed)
Cardiac Individual Treatment Plan  Patient Details  Name: [name] MRN: [id number] Date of Birth: 1960/09/30 Referring Provider:   Flowsheet Row CARDIAC REHAB PHASE II ORIENTATION from 01/26/2016 in [place]  Referring Provider  Fernande Bras, MD      Initial Encounter Date:  [place] PHASE II ORIENTATION from 01/26/2016 in [place]  Date  01/26/16  Referring Provider  Fernande Bras, MD      Visit Diagnosis: Heart failure, chronic systolic ([place])  Patient's Home Medications on Admission:  Current Outpatient Prescriptions:  .  atorvastatin (LIPITOR) 40 MG tablet, TAKE 1 TABLET BY MOUTH AT BEDTIME, Disp: 30 tablet, Rfl: 2 .  BREO ELLIPTA 100-25 MCG/INH AEPB, Inhale 1 puff into the lungs daily., Disp: , Rfl: 11 .  carvedilol (COREG) 6.25 MG tablet, Take 1 tablet (6.25 mg total) by mouth 2 (two) times daily. (Patient taking differently: Take 9.375 mg by mouth 2 (two) times daily. ), Disp: 60 tablet, Rfl: 11 .  digoxin (LANOXIN) 0.25 MG tablet, TAKE 1/2 TAB (0.125 MG TOTAL) BY MOUTH DAILY., Disp: 15 tablet, Rfl: 3 .  Febuxostat (ULORIC) 80 MG TABS, Take 80 mg by mouth at bedtime., Disp: , Rfl:  .  gabapentin (NEURONTIN) 300 MG capsule, Take 1 capsule by mouth 3 (three) times daily. Pain, Disp: , Rfl:  .  hydrALAZINE (APRESOLINE) 100 MG tablet, Take 100 mg by mouth 2 (two) times daily., Disp: , Rfl:  .  magnesium oxide (MAG-OX) 400 MG tablet, Take two tablets (800 mg) by mouth twice daily, Disp: 120 tablet, Rfl: 11 .  MITIGARE 0.6 MG CAPS, Take 0.6 mg by mouth daily., Disp: 30 capsule, Rfl: 6 .  tobramycin-dexamethasone (TOBRADEX) ophthalmic solution, Place 2 drops into the right eye 2 (two) times daily., Disp: , Rfl: 5 .  torsemide (DEMADEX) 10 MG tablet, Take 10 mg by mouth daily. Taking 10 mg every other day., Disp: , Rfl:   Past Medical History: Past Medical History:  Diagnosis Date  .  Arthritis    GOUT  . Automatic implantable cardioverter-defibrillator in situ   . Biventricular ICD (implantable cardiac defibrillator) Med[employer]onic ]    DOI 2008/ upgrade 2010/ Gen Change 2014  . Bladder tumor    PT HOSP AT [facility] 4/24 TO 4/26 2014 WITH UTI--AND FOUND TO HAVE BLADDER TUMOR-  . Cardiomyopathy    Idiopathic dilated;   . CHF (congestive heart failure) ([place])   . CKD (chronic kidney disease)    stage III baseline Crt 1.8-2.0  . Coronary artery disease   . Gout 08/03/12   PT C/O OF RIGHT KNEE PAIN AND SWELLING -STATES GOUT FLARE UP - ONGOING SINCE APRIL - BUT SWELLING W/IN LAST WEEK  . Hilar mass    Noted CT 2010  . HTN (hypertension)    Dr. Caryl Comes cardiologist  . Hypothyroidism   . 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 Sleep apnea    CPAP  . Systolic CHF ([place])    EF 0000000  . Ventricular tachycardia ([place])    s/p ICD    Tobacco Use: History  Smoking Status  . Never Smoker  Smokeless Tobacco  . Never Used    Labs: Recent Review Flowsheet Data    Labs for ITP Cardiac and Pulmonary Rehab Latest Ref Rng & Units 02/14/2014 02/14/2014 02/15/2014 02/18/2014 02/21/2014   Hemoglobin A1c <5.7 % - - - - -  PHART 7.350 - 7.450 - - - - -   PCO2ART 35.0 - 45.0 mmHg - - - - -   HCO3 20.0 - 24.0 mEq/L - - - - -   TCO2 0 - 100 mmol/L 17 19 - - -   ACIDBASEDEF 0.0 - 2.0 mmol/L - - - - -   O2SAT % - - 64.6 58.2 58.1      Capillary Blood Glucose: Lab Results  Component Value Date   GLUCAP 98 02/22/2014   GLUCAP 125 (H) 02/22/2014   GLUCAP 147 (H) 02/21/2014   GLUCAP 136 (H) 02/21/2014   GLUCAP 130 (H) 02/21/2014     Exercise Target Goals: Date: 01/26/16  Exercise Program Goal: Individual exercise prescription set with THRR, safety & activity barriers. Participant demonstrates ability to understand and report RPE using BORG scale, to self-measure pulse accurately, and to acknowledge the importance of the exercise  prescription.  Exercise Prescription Goal: Starting with aerobic activity 30 plus minutes a day, 3 days per week for initial exercise prescription. Provide home exercise prescription and guidelines that participant acknowledges understanding prior to discharge.  Activity Barriers & Risk Stratification:     Activity Barriers & Cardiac Risk Stratification - 01/26/16 1638      Activity Barriers & Cardiac Risk Stratification   Activity Barriers Shortness of Breath;Assistive Device;Other (comment)   Comments Gout   Cardiac Risk Stratification High      6 Minute Walk:     6 Minute Walk    Row Name 01/26/16 1629         6 Minute Walk   Phase Initial     Distance 887 feet     Walk Time 6 minutes     # of Rest Breaks 0     MPH 1.68     METS 2.6     RPE 13     Perceived Dyspnea  3     VO2 Peak 9.09     Symptoms Yes (comment)     Comments DOE     Resting HR 90 bpm     Resting BP 118/78     Max Ex. HR 106 bpm     Max Ex. BP 128/80     2 Minute Post BP 116/78        Initial Exercise Prescription:     Initial Exercise Prescription - 01/26/16 1600      Date of Initial Exercise RX and Referring Provider   Date 01/26/16   Referring Provider Fernande Bras, MD     Bike   Level 0.9   Minutes 10   METs 2.58     NuStep   Level 1   Minutes 10   METs 2     Arm Ergometer   Level 1   Watts 25   Minutes 10   METs 2.22     Prescription Details   Frequency (times per week) 3   Duration Progress to 30 minutes of continuous aerobic without signs/symptoms of physical distress     Intensity   THRR 40-80% of Max Heartrate 66-132   Ratings of Perceived Exertion 11-13   Perceived Dyspnea 0-4     Progression   Progression Continue to progress workloads to maintain intensity without signs/symptoms of physical distress.     Resistance Training   Training Prescription Yes   Weight 1   Reps 10-12      Perform Capillary Blood Glucose checks as needed.  Exercise  Prescription Changes:   Exercise  Comments:   Discharge Exercise Prescription (Final Exercise Prescription Changes):   Nutrition:  Target Goals: Understanding of nutrition guidelines, daily intake of sodium 1500mg , cholesterol 200mg , calories 30% from fat and 7% or less from saturated fats, daily to have 5 or more servings of fruits and vegetables.  Biometrics:     Pre Biometrics - 01/26/16 1628      Pre Biometrics   Height 5\' 10"  (1.778 m)   Weight 239 lb 6.7 oz (108.6 kg)   Waist Circumference 46 inches   Hip Circumference 41 inches   Waist to Hip Ratio 1.12 %   BMI (Calculated) 34.4   Triceps Skinfold 27 mm   % Body Fat 34.6 %   Grip Strength 42 kg   Flexibility 0 in   Single Leg Stand 2.93 seconds       Nutrition Therapy Plan and Nutrition Goals:   Nutrition Discharge: Nutrition Scores:   Nutrition Goals Re-Evaluation:   Psychosocial: Target Goals: Acknowledge presence or absence of depression, maximize coping skills, provide positive support system. Participant is able to verbalize types and ability to use techniques and skills needed for reducing stress and depression.  Initial Review & Psychosocial Screening:     Initial Psych Review & Screening - 01/26/16 1710      Family Dynamics   Good Support System? --  family       Quality of Life Scores:     Quality of Life - 01/26/16 1624      Quality of Life Scores   Health/Function Pre 14.93 %   Socioeconomic Pre 22.07 %   Psych/Spiritual Pre 16.07 %   Family Pre 19.5 %   GLOBAL Pre 17.31 %      PHQ-9: Recent Review Flowsheet Data    Depression screen [facility] 2/9 01/31/2015 12/14/2014 10/16/2014 09/19/2014   Decreased Interest 0 0 0 0   Down, Depressed, Hopeless 0 0 0 0   PHQ - 2 Score 0 0 0 0      Psychosocial Evaluation and Intervention:   Psychosocial Re-Evaluation:   Vocational Rehabilitation: Provide vocational rehab assistance to qualifying candidates.   Vocational Rehab  Evaluation & Intervention:     Vocational Rehab - 01/26/16 1616      Initial Vocational Rehab Evaluation & Intervention   Assessment shows need for Vocational Rehabilitation No      Education: Education Goals: Education classes will be provided on a weekly basis, covering required topics. Participant will state understanding/return demonstration of topics presented.  Learning Barriers/Preferences:     Learning Barriers/Preferences - 01/26/16 1639      Learning Barriers/Preferences   Learning Barriers Sight      Education Topics: Count Your Pulse:  -Group instruction provided by verbal instruction, demonstration, patient participation and written materials to support subject.  Instructors address importance of being able to find your pulse and how to count your pulse when at home without a heart monitor.  Patients get hands on experience counting their pulse with staff help and individually.   Heart Attack, Angina, and Risk Factor Modification:  -Group instruction provided by verbal instruction, video, and written materials to support subject.  Instructors address signs and symptoms of angina and heart attacks.    Also discuss risk factors for heart disease and how to make changes to improve heart health risk factors.   Functional Fitness:  -Group instruction provided by verbal instruction, demonstration, patient participation, and written materials to support subject.  Instructors address safety measures for doing things around  the house.  Discuss how to get up and down off the floor, how to pick things up properly, how to safely get out of a chair without assistance, and balance training.   Meditation and Mindfulness:  -Group instruction provided by verbal instruction, patient participation, and written materials to support subject.  Instructor addresses importance of mindfulness and meditation practice to help reduce stress and improve awareness.  Instructor also leads  participants through a meditation exercise.    Stretching for Flexibility and Mobility:  -Group instruction provided by verbal instruction, patient participation, and written materials to support subject.  Instructors lead participants through series of stretches that are designed to increase flexibility thus improving mobility.  These stretches are additional exercise for major muscle groups that are typically performed during regular warm up and cool down.   Hands Only CPR Anytime:  -Group instruction provided by verbal instruction, video, patient participation and written materials to support subject.  Instructors co-teach with AHA video for hands only CPR.  Participants get hands on experience with mannequins.   Nutrition I class: Heart Healthy Eating:  -Group instruction provided by PowerPoint slides, verbal discussion, and written materials to support subject matter. The instructor gives an explanation and review of the Therapeutic Lifestyle Changes diet recommendations, which includes a discussion on lipid goals, dietary fat, sodium, fiber, plant stanol/sterol esters, sugar, and the components of a well-balanced, healthy diet.   Nutrition II class: Lifestyle Skills:  -Group instruction provided by PowerPoint slides, verbal discussion, and written materials to support subject matter. The instructor gives an explanation and review of label reading, grocery shopping for heart health, heart healthy recipe modifications, and ways to make healthier choices when eating out.   Diabetes Question & Answer:  -Group instruction provided by PowerPoint slides, verbal discussion, and written materials to support subject matter. The instructor gives an explanation and review of diabetes co-morbidities, pre- and post-prandial blood glucose goals, pre-exercise blood glucose goals, signs, symptoms, and treatment of hypoglycemia and hyperglycemia, and foot care basics.   Diabetes Blitz:  -Group  instruction provided by PowerPoint slides, verbal discussion, and written materials to support subject matter. The instructor gives an explanation and review of the physiology behind type 1 and type 2 diabetes, diabetes medications and rational behind using different medications, pre- and post-prandial blood glucose recommendations and Hemoglobin A1c goals, diabetes diet, and exercise including blood glucose guidelines for exercising safely.    Portion Distortion:  -Group instruction provided by PowerPoint slides, verbal discussion, written materials, and food models to support subject matter. The instructor gives an explanation of serving size versus portion size, changes in portions sizes over the last 20 years, and what consists of a serving from each food group.   Stress Management:  -Group instruction provided by verbal instruction, video, and written materials to support subject matter.  Instructors review role of stress in heart disease and how to cope with stress positively.     Exercising on Your Own:  -Group instruction provided by verbal instruction, power point, and written materials to support subject.  Instructors discuss benefits of exercise, components of exercise, frequency and intensity of exercise, and end points for exercise.  Also discuss use of nitroglycerin and activating EMS.  Review options of places to exercise outside of rehab.  Review guidelines for sex with heart disease.   Cardiac Drugs I:  -Group instruction provided by verbal instruction and written materials to support subject.  Instructor reviews cardiac drug classes: antiplatelets, anticoagulants, beta blockers, and statins.  Instructor discusses reasons, side effects, and lifestyle considerations for each drug class.   Cardiac Drugs II:  -Group instruction provided by verbal instruction and written materials to support subject.  Instructor reviews cardiac drug classes: angiotensin converting enzyme inhibitors  (ACE-I), angiotensin II receptor blockers (ARBs), nitrates, and calcium channel blockers.  Instructor discusses reasons, side effects, and lifestyle considerations for each drug class.   Anatomy and Physiology of the Circulatory System:  -Group instruction provided by verbal instruction, video, and written materials to support subject.  Reviews functional anatomy of heart, how it relates to various diagnoses, and what role the heart plays in the overall system.   Knowledge Questionnaire Score:     Knowledge Questionnaire Score - 01/26/16 1618      Knowledge Questionnaire Score   Pre Score 19/24      Core Components/Risk Factors/Patient Goals at Admission:     Personal Goals and Risk Factors at Admission - 01/26/16 1634      Core Components/Risk Factors/Patient Goals on Admission    Weight Management Yes;Obesity   Intervention Weight Management: Develop a combined nutrition and exercise program designed to reach desired caloric intake, while maintaining appropriate intake of nutrient and fiber, sodium and fats, and appropriate energy expenditure required for the weight goal.;Weight Management: Provide education and appropriate resources to help participant work on and attain dietary goals.;Weight Management/Obesity: Establish reasonable short term and long term weight goals.;Obesity: Provide education and appropriate resources to help participant work on and attain dietary goals.   Admit Weight 239 lb 6.7 oz (108.6 kg)   Goal Weight: Short Term 233 lb (105.7 kg)   Goal Weight: Long Term 229 lb (103.9 kg)   Expected Outcomes Short Term: Continue to assess and modify interventions until short term weight is achieved;Long Term: Adherence to nutrition and physical activity/exercise program aimed toward attainment of established weight goal;Weight Loss: Understanding of general recommendations for a balanced deficit meal plan, which promotes 1-2 lb weight loss per week and includes a negative  energy balance of [phone] kcal/d   Sedentary Yes   Intervention Provide advice, education, support and counseling about physical activity/exercise needs.;Develop an individualized exercise prescription for aerobic and resistive training based on initial evaluation findings, risk stratification, comorbidities and participant's personal goals.   Expected Outcomes Achievement of increased cardiorespiratory fitness and enhanced flexibility, muscular endurance and strength shown through measurements of functional capacity and personal statement of participant.   Hypertension Yes   Intervention Provide education on lifestyle modifcations including regular physical activity/exercise, weight management, moderate sodium restriction and increased consumption of fresh fruit, vegetables, and low fat dairy, alcohol moderation, and smoking cessation.;Monitor prescription use compliance.   Expected Outcomes Short Term: Continued assessment and intervention until BP is < 140/62mm HG in hypertensive participants. < 130/66mm HG in hypertensive participants with diabetes, heart failure or chronic kidney disease.;Long Term: Maintenance of blood pressure at goal levels.   Lipids Yes   Intervention Provide education and support for participant on nutrition & aerobic/resistive exercise along with prescribed medications to achieve LDL 70mg , HDL >40mg .   Expected Outcomes Short Term: Participant states understanding of desired cholesterol values and is compliant with medications prescribed. Participant is following exercise prescription and nutrition guidelines.;Long Term: Cholesterol controlled with medications as prescribed, with individualized exercise RX and with personalized nutrition plan. Value goals: LDL < 70mg , HDL > 40 mg.   Personal Goal Other Yes   Personal Goal Get heart stronger, so he can have diaphragm suregery.   Intervention Develop aerobic and  resistance training program to increase functional capacity and  cardiorespiratory fitness.    Expected Outcomes Improve functional capacity as measured by 6 minute walk test. Have diaphragm surgery once cleared by physician.      Core Components/Risk Factors/Patient Goals Review:    Core Components/Risk Factors/Patient Goals at Discharge (Final Review):    ITP Comments:     ITP Comments    Row Name 01/26/16 1333           ITP Comments Medical Director- Dr. Fransico Him, MD.          Comments: Patient attended orientation from 1330  to 1530 to review rules and guidelines for program. Completed 6 minute walk test, Intitial ITP, and exercise prescription.  VSS. Telemetry-ventricular pacing.  Asymptomatic.

## 2016-02-01 ENCOUNTER — Encounter (HOSPITAL_COMMUNITY)
Admission: RE | Admit: 2016-02-01 | Discharge: 2016-02-01 | Disposition: A | Discharge status: Home / Self Care | Payer: 59 | Source: Ambulatory Visit | Attending: Cardiology | Admitting: Cardiology

## 2016-02-01 DIAGNOSIS — I5022 Chronic systolic (congestive) heart failure: Secondary | ICD-10-CM

## 2016-02-01 DIAGNOSIS — I130 Hypertensive heart and chronic kidney disease with heart failure and stage 1 through stage 4 chronic kidney disease, or unspecified chronic kidney disease: Secondary | ICD-10-CM | POA: Diagnosis not present

## 2016-02-01 NOTE — Progress Notes (Signed)
Daily Session Note  Patient Details  Name: [name] MRN: [id number] Date of Birth: [birth year] Referring Provider:   Flowsheet Row CARDIAC REHAB PHASE II ORIENTATION from 01/26/2016 in [place]  Referring Provider  Fernande Bras, MD      Encounter Date: 02/01/2016  Check In:     Session Check In - 02/01/16 0746      Check-In   Location [facility]   Staff Present Cleda Mccreedy, MS, Exercise Physiologist;Toshua Honsinger Wilber Oliphant, RN, Luisa Hart, RN, BSN;Joann Rion, RN, BSN   Supervising physician immediately available to respond to emergencies [facility] immediately available   Physician(s) Dr. Cathlean Sauer   Medication changes reported     No   Fall or balance concerns reported    No   Warm-up and Cool-down Performed as group-led instruction   Resistance Training Performed Yes   VAD Patient? No     Pain Assessment   Currently in Pain? No/denies      Capillary Blood Glucose: No results found for this or any previous visit (from the past 24 hour(s)).   Goals Met:  Personal goals reviewed No report of cardiac concerns or symptoms  Goals Unmet:  Not Applicable  Comments:  Pt started full exercise at  cardiac rehab today.  Pt tolerated light exercise with difficulty due to overall deconditioning.  Pt used rolater to help with support during ambulation. VSS, telemetry-paced rhythm  asymptomatic.  Medication list reconciled. Pt denies barriers to medication compliance.  PSYCHOSOCIAL ASSESSMENT:  PHQ-6. Pt is a prior participant in cardiac rehab.  Observation of pt reveals decline in health and functional capacity. Pt is not as active nor mobile as he once was.  Pt unable to ambulate for short periods of time.  Pt has worries due to the chronic nature of CHF and recently changed his Heart Failure MD to [facility].  Pt has upcoming appt on today.  Pt given rehab report for md to review. Pt has supportive family which  includes his wife. Will provided frequent check in with pt to assess his mental status and need for medical professional intervention.   Pt enjoys playing the piano. Pt desires to get her heart stronger so that he may have his diaphragm surgery.  Will continue to check in with pt to assess his progress toward meeting this goal.  Pt oriented to exercise equipment and routine.    Understanding verbalized. Maurice Small RN, BSN     Dr. Fransico Him is Medical Director for Cardiac Rehab at [facility].

## 2016-02-03 ENCOUNTER — Ambulatory Visit (INDEPENDENT_AMBULATORY_CARE_PROVIDER_SITE_OTHER): Payer: 59

## 2016-02-03 ENCOUNTER — Encounter (HOSPITAL_COMMUNITY)
Admission: RE | Admit: 2016-02-03 | Discharge: 2016-02-03 | Disposition: A | Discharge status: Home / Self Care | Payer: 59 | Source: Ambulatory Visit | Attending: Cardiology | Admitting: Cardiology

## 2016-02-03 DIAGNOSIS — I130 Hypertensive heart and chronic kidney disease with heart failure and stage 1 through stage 4 chronic kidney disease, or unspecified chronic kidney disease: Secondary | ICD-10-CM | POA: Diagnosis not present

## 2016-02-03 DIAGNOSIS — Z95810 Presence of automatic (implantable) cardiac defibrillator: Secondary | ICD-10-CM

## 2016-02-03 DIAGNOSIS — I5042 Chronic combined systolic (congestive) and diastolic (congestive) heart failure: Secondary | ICD-10-CM

## 2016-02-03 DIAGNOSIS — I428 Other cardiomyopathies: Secondary | ICD-10-CM

## 2016-02-03 DIAGNOSIS — I5022 Chronic systolic (congestive) heart failure: Secondary | ICD-10-CM

## 2016-02-04 NOTE — Progress Notes (Signed)
EPIC Encounter for ICM Monitoring  Patient Name: [name] is a 55 y.o. male Date: 02/04/2016 Primary Care Physican: Donetta Potts, MD Primary [place] Electrophysiologist: Caryl Comes Dry Weight:230 lbs Bi-V Pacing: 92.9%       Heart Failure questions reviewed, pt asymptomatic   Thoracic impedance normal   Labs: 02/01/2016 Creatinine 1.67, BUN 27, Potassium 4.9, Sodium 138, EGFR 43-52   Care Everywhere [facility]  12/30/2015 Creatinine 1.56, BUN 30, Potassium 4.7, Sodium 137, EGFR 46-56 Care Everywhere [facility]  11/26/2015 Creatinine 1.35, BUN 32, Potassium 4.9, Sodium 136  Care Everywhere [facility]  11/19/2015 Creatinine 1.55, BUN 32, Potassium 5.1, Sodium 135 BNP 378 - Care Everywhere report - [facility] 11/05/2015 Creatinine 1.61, BUN 28, Potassium 4.8, Sodium 141 - Care Everywhere report [facility] 06/26/2015 Creatinine 1.55, BUN 27, Potassium 5.3, Sodium 136 Per Care Everywhere report: Creatinine has ranged from 1.34 to 1.70 during 09/11/2015 to 11/19/2015.  Recommendations: No changes.  Reinforced low salt food choices and limiting fluid intake to < 2 liters per day. Encouraged to call for fluid symptoms.    Follow-up plan: ICM clinic phone appointment on 03/09/2015.  Copy of ICM check sent to device physician.   ICM trend: 02/03/2016       Rosalene Billings, RN 02/04/2016 2:15 PM

## 2016-02-05 ENCOUNTER — Encounter (HOSPITAL_COMMUNITY)
Admission: RE | Admit: 2016-02-05 | Discharge: 2016-02-05 | Disposition: A | Discharge status: Home / Self Care | Payer: 59 | Source: Ambulatory Visit | Attending: Cardiology | Admitting: Cardiology

## 2016-02-05 DIAGNOSIS — I5022 Chronic systolic (congestive) heart failure: Secondary | ICD-10-CM | POA: Diagnosis not present

## 2016-02-08 ENCOUNTER — Encounter (HOSPITAL_COMMUNITY)
Admission: RE | Admit: 2016-02-08 | Discharge: 2016-02-08 | Disposition: A | Discharge status: Home / Self Care | Payer: 59 | Source: Ambulatory Visit | Attending: Cardiology | Admitting: Cardiology

## 2016-02-08 DIAGNOSIS — I5022 Chronic systolic (congestive) heart failure: Secondary | ICD-10-CM

## 2016-02-10 ENCOUNTER — Encounter (HOSPITAL_COMMUNITY)
Admission: RE | Admit: 2016-02-10 | Discharge: 2016-02-10 | Disposition: A | Discharge status: Home / Self Care | Payer: 59 | Source: Ambulatory Visit | Attending: Cardiology | Admitting: Cardiology

## 2016-02-10 DIAGNOSIS — I5022 Chronic systolic (congestive) heart failure: Secondary | ICD-10-CM | POA: Diagnosis not present

## 2016-02-10 NOTE — Progress Notes (Signed)
QUALITY OF LIFE SCORE REVIEW  Pt completed Quality of Life survey as a participant in Cardiac Rehab. Scores 21.0 or below are considered low. Pt score very low in several areas.  Pt scored the following     Quality of Life - 01/26/16 1624      Quality of Life Scores   Health/Function Pre 14.93 %   Socioeconomic Pre 22.07 %   Psych/Spiritual Pre 16.07 %   Family Pre 19.5 %   GLOBAL Pre 17.31 %         Patient quality of life  altered by physical constraints which limits ability to perform as prior to his chronic cardiac illness.  Pt main goal is to be able to be stronger for upcoming surgery on his diaphragm that he needs.  Pt is hopeful he will get his heart stronger so that he will be cleared to have the surgery. Pt has supportive wife and understands the routine of cardiac rehab due to prior participation   Offered emotional support and reassurance.  Will continue to monitor and intervene as necessary.  Cherre Huger, BSN

## 2016-02-11 NOTE — Progress Notes (Signed)
Cardiac Individual Treatment Plan  Patient Details  Name: [name] MRN: [id number] Date of Birth: 04-Oct-1960 Referring Provider:   Flowsheet Row CARDIAC REHAB PHASE II ORIENTATION from 01/26/2016 in [place]  Referring Provider  Fernande Bras, MD      Initial Encounter Date:  [place] PHASE II ORIENTATION from 01/26/2016 in [place]  Date  01/26/16  Referring Provider  Fernande Bras, MD      Visit Diagnosis: Heart failure, chronic systolic ([place])  Patient's Home Medications on Admission:  Current Outpatient Prescriptions:  .  atorvastatin (LIPITOR) 40 MG tablet, TAKE 1 TABLET BY MOUTH AT BEDTIME, Disp: 30 tablet, Rfl: 2 .  BREO ELLIPTA 100-25 MCG/INH AEPB, Inhale 1 puff into the lungs daily., Disp: , Rfl: 11 .  carvedilol (COREG) 6.25 MG tablet, Take 1 tablet (6.25 mg total) by mouth 2 (two) times daily. (Patient taking differently: Take 9.375 mg by mouth 2 (two) times daily. ), Disp: 60 tablet, Rfl: 11 .  digoxin (LANOXIN) 0.25 MG tablet, TAKE 1/2 TAB (0.125 MG TOTAL) BY MOUTH DAILY., Disp: 15 tablet, Rfl: 3 .  Febuxostat (ULORIC) 80 MG TABS, Take 80 mg by mouth at bedtime., Disp: , Rfl:  .  gabapentin (NEURONTIN) 300 MG capsule, Take 1 capsule by mouth 3 (three) times daily. Pain, Disp: , Rfl:  .  hydrALAZINE (APRESOLINE) 100 MG tablet, Take 100 mg by mouth 2 (two) times daily., Disp: , Rfl:  .  magnesium oxide (MAG-OX) 400 MG tablet, Take two tablets (800 mg) by mouth twice daily, Disp: 120 tablet, Rfl: 11 .  MITIGARE 0.6 MG CAPS, Take 0.6 mg by mouth daily., Disp: 30 capsule, Rfl: 6 .  tobramycin-dexamethasone (TOBRADEX) ophthalmic solution, Place 2 drops into the right eye 2 (two) times daily., Disp: , Rfl: 5 .  torsemide (DEMADEX) 10 MG tablet, Take 10 mg by mouth daily. Taking 10 mg every other day., Disp: , Rfl:   Past Medical History: Past Medical History:  Diagnosis Date  .  Arthritis    GOUT  . Automatic implantable cardioverter-defibrillator in situ   . Biventricular ICD (implantable cardiac defibrillator) Medtronic ]    DOI 2008/ upgrade 2010/ Gen Change 2014  . Bladder tumor    PT HOSP AT [facility] 4/24 TO 4/26 2014 WITH UTI--AND FOUND TO HAVE BLADDER TUMOR-  . Cardiomyopathy    Idiopathic dilated;   . CHF (congestive heart failure) ([place])   . CKD (chronic kidney disease)    stage III baseline Crt 1.8-2.0  . Coronary artery disease   . Gout 08/03/12   PT C/O OF RIGHT KNEE PAIN AND SWELLING -STATES GOUT FLARE UP - ONGOING SINCE APRIL - BUT SWELLING W/IN LAST WEEK  . Hilar mass    Noted CT 2010  . HTN (hypertension)    Dr. Caryl Comes cardiologist  . Hypothyroidism   . 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 Sleep apnea    CPAP  . Systolic CHF ([place])    EF 0000000  . Ventricular tachycardia ([place])    s/p ICD    Tobacco Use: History  Smoking Status  . Never Smoker  Smokeless Tobacco  . Never Used    Labs: Recent Review Flowsheet Data    Labs for ITP Cardiac and Pulmonary Rehab Latest Ref Rng & Units 02/14/2014 02/14/2014 02/15/2014 02/18/2014 02/21/2014   Hemoglobin A1c <5.7 % - - - - -  PHART 7.350 - 7.450 - - - - -   PCO2ART 35.0 - 45.0 mmHg - - - - -   HCO3 20.0 - 24.0 mEq/L - - - - -   TCO2 0 - 100 mmol/L 17 19 - - -   ACIDBASEDEF 0.0 - 2.0 mmol/L - - - - -   O2SAT % - - 64.6 58.2 58.1      Capillary Blood Glucose: Lab Results  Component Value Date   GLUCAP 98 02/22/2014   GLUCAP 125 (H) 02/22/2014   GLUCAP 147 (H) 02/21/2014   GLUCAP 136 (H) 02/21/2014   GLUCAP 130 (H) 02/21/2014     Exercise Target Goals:    Exercise Program Goal: Individual exercise prescription set with THRR, safety & activity barriers. Participant demonstrates ability to understand and report RPE using BORG scale, to self-measure pulse accurately, and to acknowledge the importance of the exercise  prescription.  Exercise Prescription Goal: Starting with aerobic activity 30 plus minutes a day, 3 days per week for initial exercise prescription. Provide home exercise prescription and guidelines that participant acknowledges understanding prior to discharge.  Activity Barriers & Risk Stratification:     Activity Barriers & Cardiac Risk Stratification - 01/26/16 1638      Activity Barriers & Cardiac Risk Stratification   Activity Barriers Shortness of Breath;Assistive Device;Other (comment)   Comments Gout   Cardiac Risk Stratification High      6 Minute Walk:     6 Minute Walk    Row Name 01/26/16 1629         6 Minute Walk   Phase Initial     Distance 887 feet     Walk Time 6 minutes     # of Rest Breaks 0     MPH 1.68     METS 2.6     RPE 13     Perceived Dyspnea  3     VO2 Peak 9.09     Symptoms Yes (comment)     Comments DOE     Resting HR 90 bpm     Resting BP 118/78     Max Ex. HR 106 bpm     Max Ex. BP 128/80     2 Minute Post BP 116/78        Initial Exercise Prescription:     Initial Exercise Prescription - 01/26/16 1600      Date of Initial Exercise RX and Referring Provider   Date 01/26/16   Referring Provider Fernande Bras, MD     Bike   Level (P)  0.5   Minutes 10   METs 2.58     NuStep   Level 1   Minutes 10   METs 2     Arm Ergometer   Level 1   Watts 25   Minutes 10   METs 2.22     Prescription Details   Frequency (times per week) 3   Duration Progress to 30 minutes of continuous aerobic without signs/symptoms of physical distress     Intensity   THRR 40-80% of Max Heartrate 66-132   Ratings of Perceived Exertion 11-13   Perceived Dyspnea 0-4     Progression   Progression Continue to progress workloads to maintain intensity without signs/symptoms of physical distress.     Resistance Training   Training Prescription Yes   Weight 1   Reps 10-12      Perform Capillary Blood Glucose checks as needed.  Exercise  Prescription Changes:  Exercise Comments:     Exercise Comments    Row Name 02/11/16 1136           Exercise Comments Pt is off to a great start with exercise!          Discharge Exercise Prescription (Final Exercise Prescription Changes):   Nutrition:  Target Goals: Understanding of nutrition guidelines, daily intake of sodium 1500mg , cholesterol 200mg , calories 30% from fat and 7% or less from saturated fats, daily to have 5 or more servings of fruits and vegetables.  Biometrics:     Pre Biometrics - 01/26/16 1628      Pre Biometrics   Height 5\' 10"  (1.778 m)   Weight 239 lb 6.7 oz (108.6 kg)   Waist Circumference 46 inches   Hip Circumference 41 inches   Waist to Hip Ratio 1.12 %   BMI (Calculated) 34.4   Triceps Skinfold 27 mm   % Body Fat 34.6 %   Grip Strength 42 kg   Flexibility 0 in   Single Leg Stand 2.93 seconds       Nutrition Therapy Plan and Nutrition Goals:     Nutrition Therapy & Goals - 02/10/16 1053      Nutrition Therapy   Diet Low Sodium     Personal Nutrition Goals   Personal Goal #1 1-2 lb wt loss/week to a wt loss goal of 6-24 lb at graduation from [place], educate and counsel regarding individualized specific dietary modifications aiming towards targeted core components such as weight, hypertension, lipid management, diabetes, heart failure and other comorbidities.   Expected Outcomes Short Term Goal: Understand basic principles of dietary content, such as calories, fat, sodium, cholesterol and nutrients.;Long Term Goal: Adherence to prescribed nutrition plan.      Nutrition Discharge: Nutrition Scores:     Nutrition Assessments - 02/10/16 1053      MEDFICTS Scores   Pre Score 39      Nutrition Goals Re-Evaluation:   Psychosocial: Target Goals: Acknowledge presence or absence of depression, maximize coping skills, provide positive support system. Participant is able  to verbalize types and ability to use techniques and skills needed for reducing stress and depression.  Initial Review & Psychosocial Screening:     Initial Psych Review & Screening - 01/26/16 1710      Family Dynamics   Good Support System? --  family       Quality of Life Scores:     Quality of Life - 01/26/16 1624      Quality of Life Scores   Health/Function Pre 14.93 %   Socioeconomic Pre 22.07 %   Psych/Spiritual Pre 16.07 %   Family Pre 19.5 %   GLOBAL Pre 17.31 %      PHQ-9: Recent Review Flowsheet Data    Depression screen [facility] 2/9 02/01/2016 01/31/2015 12/14/2014 10/16/2014 09/19/2014   Decreased Interest 1 0 0 0 0   Down, Depressed, Hopeless 1 0 0 0 0   PHQ - 2 Score 2 0 0 0 0   Altered sleeping 1 - - - -   Tired, decreased energy 1 - - - -   Change in appetite 0 - - - -   Feeling bad or failure about yourself  1 - - - -   Trouble concentrating 1 - - - -   Moving slowly or fidgety/restless 0 - - - -   Suicidal thoughts 0 - - - -  PHQ-9 Score 6 - - - -      Psychosocial Evaluation and Intervention:     Psychosocial Evaluation - 02/11/16 1319      Psychosocial Evaluation & Interventions   Interventions Stress management education   Comments Pt with added stress due to the need to have surgery but not being well enough to undergo the surgery     Discharge Psychosocial Assessment & Intervention   Discharge Continue support measures as needed      Psychosocial Re-Evaluation:     Psychosocial Re-Evaluation    Row Name 02/11/16 1320             Psychosocial Re-Evaluation   Interventions Stress management education;Relaxation education;Encouraged to attend Cardiac Rehabilitation for the exercise          Vocational Rehabilitation: Provide vocational rehab assistance to qualifying candidates.   Vocational Rehab Evaluation & Intervention:     Vocational Rehab - 01/26/16 1616      Initial Vocational Rehab Evaluation & Intervention    Assessment shows need for Vocational Rehabilitation No      Education: Education Goals: Education classes will be provided on a weekly basis, covering required topics. Participant will state understanding/return demonstration of topics presented.  Learning Barriers/Preferences:     Learning Barriers/Preferences - 01/26/16 1639      Learning Barriers/Preferences   Learning Barriers Sight      Education Topics: Count Your Pulse:  -Group instruction provided by verbal instruction, demonstration, patient participation and written materials to support subject.  Instructors address importance of being able to find your pulse and how to count your pulse when at home without a heart monitor.  Patients get hands on experience counting their pulse with staff help and individually.   Heart Attack, Angina, and Risk Factor Modification:  -Group instruction provided by verbal instruction, video, and written materials to support subject.  Instructors address signs and symptoms of angina and heart attacks.    Also discuss risk factors for heart disease and how to make changes to improve heart health risk factors.   Functional Fitness:  -Group instruction provided by verbal instruction, demonstration, patient participation, and written materials to support subject.  Instructors address safety measures for doing things around the house.  Discuss how to get up and down off the floor, how to pick things up properly, how to safely get out of a chair without assistance, and balance training.   Meditation and Mindfulness:  -Group instruction provided by verbal instruction, patient participation, and written materials to support subject.  Instructor addresses importance of mindfulness and meditation practice to help reduce stress and improve awareness.  Instructor also leads participants through a meditation exercise.    Stretching for Flexibility and Mobility:  -Group instruction provided by verbal  instruction, patient participation, and written materials to support subject.  Instructors lead participants through series of stretches that are designed to increase flexibility thus improving mobility.  These stretches are additional exercise for major muscle groups that are typically performed during regular warm up and cool down.   Hands Only CPR Anytime:  -Group instruction provided by verbal instruction, video, patient participation and written materials to support subject.  Instructors co-teach with AHA video for hands only CPR.  Participants get hands on experience with mannequins.   Nutrition I class: Heart Healthy Eating:  -Group instruction provided by PowerPoint slides, verbal discussion, and written materials to support subject matter. The instructor gives an explanation and review of the Therapeutic Lifestyle Changes diet recommendations,  which includes a discussion on lipid goals, dietary fat, sodium, fiber, plant stanol/sterol esters, sugar, and the components of a well-balanced, healthy diet.   Nutrition II class: Lifestyle Skills:  -Group instruction provided by PowerPoint slides, verbal discussion, and written materials to support subject matter. The instructor gives an explanation and review of label reading, grocery shopping for heart health, heart healthy recipe modifications, and ways to make healthier choices when eating out.   Diabetes Question & Answer:  -Group instruction provided by PowerPoint slides, verbal discussion, and written materials to support subject matter. The instructor gives an explanation and review of diabetes co-morbidities, pre- and post-prandial blood glucose goals, pre-exercise blood glucose goals, signs, symptoms, and treatment of hypoglycemia and hyperglycemia, and foot care basics.   Diabetes Blitz:  -Group instruction provided by PowerPoint slides, verbal discussion, and written materials to support subject matter. The instructor gives an  explanation and review of the physiology behind type 1 and type 2 diabetes, diabetes medications and rational behind using different medications, pre- and post-prandial blood glucose recommendations and Hemoglobin A1c goals, diabetes diet, and exercise including blood glucose guidelines for exercising safely.    Portion Distortion:  -Group instruction provided by PowerPoint slides, verbal discussion, written materials, and food models to support subject matter. The instructor gives an explanation of serving size versus portion size, changes in portions sizes over the last 20 years, and what consists of a serving from each food group.   Stress Management:  -Group instruction provided by verbal instruction, video, and written materials to support subject matter.  Instructors review role of stress in heart disease and how to cope with stress positively.     Exercising on Your Own:  -Group instruction provided by verbal instruction, power point, and written materials to support subject.  Instructors discuss benefits of exercise, components of exercise, frequency and intensity of exercise, and end points for exercise.  Also discuss use of nitroglycerin and activating EMS.  Review options of places to exercise outside of rehab.  Review guidelines for sex with heart disease.   Cardiac Drugs I:  -Group instruction provided by verbal instruction and written materials to support subject.  Instructor reviews cardiac drug classes: antiplatelets, anticoagulants, beta blockers, and statins.  Instructor discusses reasons, side effects, and lifestyle considerations for each drug class.   Cardiac Drugs II:  -Group instruction provided by verbal instruction and written materials to support subject.  Instructor reviews cardiac drug classes: angiotensin converting enzyme inhibitors (ACE-I), angiotensin II receptor blockers (ARBs), nitrates, and calcium channel blockers.  Instructor discusses reasons, side effects,  and lifestyle considerations for each drug class.   Anatomy and Physiology of the Circulatory System:  -Group instruction provided by verbal instruction, video, and written materials to support subject.  Reviews functional anatomy of heart, how it relates to various diagnoses, and what role the heart plays in the overall system.   Knowledge Questionnaire Score:     Knowledge Questionnaire Score - 01/27/16 1636      Knowledge Questionnaire Score   Pre Score 17/24      Core Components/Risk Factors/Patient Goals at Admission:     Personal Goals and Risk Factors at Admission - 01/26/16 1634      Core Components/Risk Factors/Patient Goals on Admission    Weight Management Yes;Obesity   Intervention Weight Management: Develop a combined nutrition and exercise program designed to reach desired caloric intake, while maintaining appropriate intake of nutrient and fiber, sodium and fats, and appropriate energy expenditure required for the weight  goal.;Weight Management: Provide education and appropriate resources to help participant work on and attain dietary goals.;Weight Management/Obesity: Establish reasonable short term and long term weight goals.;Obesity: Provide education and appropriate resources to help participant work on and attain dietary goals.   Admit Weight 239 lb 6.7 oz (108.6 kg)   Goal Weight: Short Term 233 lb (105.7 kg)   Goal Weight: Long Term 229 lb (103.9 kg)   Expected Outcomes Short Term: Continue to assess and modify interventions until short term weight is achieved;Long Term: Adherence to nutrition and physical activity/exercise program aimed toward attainment of established weight goal;Weight Loss: Understanding of general recommendations for a balanced deficit meal plan, which promotes 1-2 lb weight loss per week and includes a negative energy balance of [phone] kcal/d   Sedentary Yes   Intervention Provide advice, education, support and counseling about physical  activity/exercise needs.;Develop an individualized exercise prescription for aerobic and resistive training based on initial evaluation findings, risk stratification, comorbidities and participant's personal goals.   Expected Outcomes Achievement of increased cardiorespiratory fitness and enhanced flexibility, muscular endurance and strength shown through measurements of functional capacity and personal statement of participant.   Hypertension Yes   Intervention Provide education on lifestyle modifcations including regular physical activity/exercise, weight management, moderate sodium restriction and increased consumption of fresh fruit, vegetables, and low fat dairy, alcohol moderation, and smoking cessation.;Monitor prescription use compliance.   Expected Outcomes Short Term: Continued assessment and intervention until BP is < 140/70mm HG in hypertensive participants. < 130/40mm HG in hypertensive participants with diabetes, heart failure or chronic kidney disease.;Long Term: Maintenance of blood pressure at goal levels.   Lipids Yes   Intervention Provide education and support for participant on nutrition & aerobic/resistive exercise along with prescribed medications to achieve LDL 70mg , HDL >40mg .   Expected Outcomes Short Term: Participant states understanding of desired cholesterol values and is compliant with medications prescribed. Participant is following exercise prescription and nutrition guidelines.;Long Term: Cholesterol controlled with medications as prescribed, with individualized exercise RX and with personalized nutrition plan. Value goals: LDL < 70mg , HDL > 40 mg.   Personal Goal Other Yes   Personal Goal Get heart stronger, so he can have diaphragm suregery.   Intervention Develop aerobic and resistance training program to increase functional capacity and cardiorespiratory fitness.    Expected Outcomes Improve functional capacity as measured by 6 minute walk test. Have diaphragm  surgery once cleared by physician.      Core Components/Risk Factors/Patient Goals Review:    Core Components/Risk Factors/Patient Goals at Discharge (Final Review):    ITP Comments:     ITP Comments    Row Name 01/26/16 1333           ITP Comments Medical Director- Dr. Fransico Him, MD.          Comments:  Pt is making slow progress toward personal goals after completing 6 sessions. Pt uses rolater for added support to assist with ambulation.  Pt is very deconditioned and unable to do home exercise. Repeat Psychosocial Assessment: Pt with supportive family, however pt has struggles with feeling depressed about his health and the impact it has on his ability to have surgery and the progressive nature of his heart failure.  Recommend continued exercise and life style modification education including  stress management and relaxation techniques to decrease cardiac risk profile. [name], BSN

## 2016-02-12 ENCOUNTER — Encounter (HOSPITAL_COMMUNITY)
Admission: RE | Admit: 2016-02-12 | Discharge: 2016-02-12 | Disposition: A | Discharge status: Home / Self Care | Payer: 59 | Source: Ambulatory Visit | Attending: Cardiology | Admitting: Cardiology

## 2016-02-12 DIAGNOSIS — I5022 Chronic systolic (congestive) heart failure: Secondary | ICD-10-CM

## 2016-02-15 ENCOUNTER — Encounter (HOSPITAL_COMMUNITY)
Admission: RE | Admit: 2016-02-15 | Discharge: 2016-02-15 | Disposition: A | Discharge status: Home / Self Care | Payer: 59 | Source: Ambulatory Visit | Attending: Cardiology | Admitting: Cardiology

## 2016-02-15 DIAGNOSIS — I5022 Chronic systolic (congestive) heart failure: Secondary | ICD-10-CM

## 2016-02-17 ENCOUNTER — Encounter (HOSPITAL_COMMUNITY)
Admission: RE | Admit: 2016-02-17 | Discharge: 2016-02-17 | Disposition: A | Discharge status: Home / Self Care | Payer: 59 | Source: Ambulatory Visit | Attending: Cardiology | Admitting: Cardiology

## 2016-02-17 DIAGNOSIS — I5022 Chronic systolic (congestive) heart failure: Secondary | ICD-10-CM | POA: Diagnosis not present

## 2016-02-17 NOTE — Progress Notes (Signed)
Reviewed home exercise program with pt.  Discussed mode/frequency of exercise, THRR, RPE and weather conditions for exercising outside.  Also discussed signs and symptoms and when to call Dr./911.  Pt verbalized understanding.  Cleda Mccreedy, [place] 02/17/16 [phone]

## 2016-02-19 ENCOUNTER — Encounter (HOSPITAL_COMMUNITY): Payer: 59

## 2016-02-22 ENCOUNTER — Encounter (HOSPITAL_COMMUNITY): Payer: 59

## 2016-02-22 ENCOUNTER — Telehealth (HOSPITAL_COMMUNITY): Payer: Self-pay | Admitting: *Deleted

## 2016-02-22 NOTE — Telephone Encounter (Signed)
Returned pt call.  Message left on answering machine for cardiac rehab. Pt out x 2 days due to flare up for gout.  Pt is out of work today and taking medications.  Reports flare is better than before and plans to return on Wednesday with limited knee movement. Cherre Huger, BSN

## 2016-02-24 ENCOUNTER — Encounter (HOSPITAL_COMMUNITY)
Admission: RE | Admit: 2016-02-24 | Discharge: 2016-02-24 | Disposition: A | Discharge status: Home / Self Care | Payer: 59 | Source: Ambulatory Visit | Attending: Cardiology | Admitting: Cardiology

## 2016-02-24 DIAGNOSIS — I5022 Chronic systolic (congestive) heart failure: Secondary | ICD-10-CM

## 2016-02-26 ENCOUNTER — Encounter (HOSPITAL_COMMUNITY)
Admission: RE | Admit: 2016-02-26 | Discharge: 2016-02-26 | Disposition: A | Discharge status: Home / Self Care | Payer: 59 | Source: Ambulatory Visit | Attending: Cardiology | Admitting: Cardiology

## 2016-02-26 DIAGNOSIS — I5022 Chronic systolic (congestive) heart failure: Secondary | ICD-10-CM

## 2016-03-02 ENCOUNTER — Encounter (HOSPITAL_COMMUNITY)
Admission: RE | Admit: 2016-03-02 | Discharge: 2016-03-02 | Disposition: A | Discharge status: Home / Self Care | Payer: 59 | Source: Ambulatory Visit | Attending: Cardiology | Admitting: Cardiology

## 2016-03-04 ENCOUNTER — Encounter (HOSPITAL_COMMUNITY)
Admission: RE | Admit: 2016-03-04 | Discharge: 2016-03-04 | Disposition: A | Discharge status: Home / Self Care | Payer: 59 | Source: Ambulatory Visit | Attending: Cardiology | Admitting: Cardiology

## 2016-03-04 DIAGNOSIS — I5022 Chronic systolic (congestive) heart failure: Secondary | ICD-10-CM | POA: Diagnosis not present

## 2016-03-08 ENCOUNTER — Ambulatory Visit (INDEPENDENT_AMBULATORY_CARE_PROVIDER_SITE_OTHER): Payer: 59

## 2016-03-08 DIAGNOSIS — I5042 Chronic combined systolic (congestive) and diastolic (congestive) heart failure: Secondary | ICD-10-CM | POA: Diagnosis not present

## 2016-03-08 DIAGNOSIS — Z95810 Presence of automatic (implantable) cardiac defibrillator: Secondary | ICD-10-CM

## 2016-03-08 NOTE — Progress Notes (Signed)
EPIC Encounter for ICM Monitoring  Patient Name: [name] is a 56 y.o. male Date: 03/08/2016 Primary Care Physican: Donetta Potts, MD Primary Cardiologist:[place] Electrophysiologist: Caryl Comes Dry Weight:230 lbs Bi-V Pacing: 94.3%      Heart Failure questions reviewed, pt feels bloated.   Thoracic impedance abnormal suggesting fluid accumulation since 02/16/2016.  Recommendations:   He stated he would take Torsemide daily for a couple of day to relieve bloating.      Follow-up plan: ICM clinic phone appointment on 03/15/2016 to recheck fluid levels.  Copy of ICM check sent to cardiologist/device physician.   3 month ICM trend : 03/08/2016   1 Year ICM trend:      Rosalene Billings, RN 03/08/2016 9:58 AM

## 2016-03-09 ENCOUNTER — Encounter (HOSPITAL_COMMUNITY)
Admission: RE | Admit: 2016-03-09 | Discharge: 2016-03-09 | Disposition: A | Discharge status: Home / Self Care | Payer: 59 | Source: Ambulatory Visit | Attending: Cardiology | Admitting: Cardiology

## 2016-03-09 DIAGNOSIS — I5022 Chronic systolic (congestive) heart failure: Secondary | ICD-10-CM

## 2016-03-10 NOTE — Progress Notes (Signed)
Cardiac Individual Treatment Plan  Patient Details  Name: [name] MRN: [id number] Date of Birth: 11-May-1960 Referring Provider:   Flowsheet Row CARDIAC REHAB PHASE II ORIENTATION from 01/26/2016 in [place]  Referring Provider  Fernande Bras, MD      Initial Encounter Date:  [place] PHASE II ORIENTATION from 01/26/2016 in [place]  Date  01/26/16  Referring Provider  Fernande Bras, MD      Visit Diagnosis: Heart failure, chronic systolic ([place])  Patient's Home Medications on Admission:  Current Outpatient Prescriptions:  .  atorvastatin (LIPITOR) 40 MG tablet, TAKE 1 TABLET BY MOUTH AT BEDTIME, Disp: 30 tablet, Rfl: 2 .  BREO ELLIPTA 100-25 MCG/INH AEPB, Inhale 1 puff into the lungs daily., Disp: , Rfl: 11 .  carvedilol (COREG) 6.25 MG tablet, Take 1 tablet (6.25 mg total) by mouth 2 (two) times daily. (Patient taking differently: Take 9.375 mg by mouth 2 (two) times daily. ), Disp: 60 tablet, Rfl: 11 .  digoxin (LANOXIN) 0.25 MG tablet, TAKE 1/2 TAB (0.125 MG TOTAL) BY MOUTH DAILY., Disp: 15 tablet, Rfl: 3 .  Febuxostat (ULORIC) 80 MG TABS, Take 80 mg by mouth at bedtime., Disp: , Rfl:  .  gabapentin (NEURONTIN) 300 MG capsule, Take 1 capsule by mouth 3 (three) times daily. Pain, Disp: , Rfl:  .  hydrALAZINE (APRESOLINE) 100 MG tablet, Take 100 mg by mouth 2 (two) times daily., Disp: , Rfl:  .  magnesium oxide (MAG-OX) 400 MG tablet, Take two tablets (800 mg) by mouth twice daily, Disp: 120 tablet, Rfl: 11 .  MITIGARE 0.6 MG CAPS, Take 0.6 mg by mouth daily., Disp: 30 capsule, Rfl: 6 .  tobramycin-dexamethasone (TOBRADEX) ophthalmic solution, Place 2 drops into the right eye 2 (two) times daily., Disp: , Rfl: 5 .  torsemide (DEMADEX) 10 MG tablet, Take 10 mg by mouth daily. Taking 10 mg every other day., Disp: , Rfl:   Past Medical History: Past Medical History:  Diagnosis Date  .  Arthritis    GOUT  . Automatic implantable cardioverter-defibrillator in situ   . Biventricular ICD (implantable cardiac defibrillator) [employer]onic ]    DOI 2008/ upgrade 2010/ Gen Change 2014  . Bladder tumor    PT HOSP AT [facility] 4/24 TO 4/26 2014 WITH UTI--AND FOUND TO HAVE BLADDER TUMOR-  . Cardiomyopathy    Idiopathic dilated;   . CHF (congestive heart failure) ([place])   . CKD (chronic kidney disease)    stage III baseline Crt 1.8-2.0  . Coronary artery disease   . Gout 08/03/12   PT C/O OF RIGHT KNEE PAIN AND SWELLING -STATES GOUT FLARE UP - ONGOING SINCE APRIL - BUT SWELLING W/IN LAST WEEK  . Hilar mass    Noted CT 2010  . HTN (hypertension)    Dr. Caryl Comes cardiologist  . Hypothyroidism   . 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 Sleep apnea    CPAP  . Systolic CHF ([place])    EF 0000000  . Ventricular tachycardia ([place])    s/p ICD    Tobacco Use: History  Smoking Status  . Never Smoker  Smokeless Tobacco  . Never Used    Labs: Recent Review Flowsheet Data    Labs for ITP Cardiac and Pulmonary Rehab Latest Ref Rng & Units 02/14/2014 02/14/2014 02/15/2014 02/18/2014 02/21/2014   Hemoglobin A1c <5.7 % - - - - -  PHART 7.350 - 7.450 - - - - -   PCO2ART 35.0 - 45.0 mmHg - - - - -   HCO3 20.0 - 24.0 mEq/L - - - - -   TCO2 0 - 100 mmol/L 17 19 - - -   ACIDBASEDEF 0.0 - 2.0 mmol/L - - - - -   O2SAT % - - 64.6 58.2 58.1      Capillary Blood Glucose: Lab Results  Component Value Date   GLUCAP 98 02/22/2014   GLUCAP 125 (H) 02/22/2014   GLUCAP 147 (H) 02/21/2014   GLUCAP 136 (H) 02/21/2014   GLUCAP 130 (H) 02/21/2014     Exercise Target Goals:    Exercise Program Goal: Individual exercise prescription set with THRR, safety & activity barriers. Participant demonstrates ability to understand and report RPE using BORG scale, to self-measure pulse accurately, and to acknowledge the importance of the exercise  prescription.  Exercise Prescription Goal: Starting with aerobic activity 30 plus minutes a day, 3 days per week for initial exercise prescription. Provide home exercise prescription and guidelines that participant acknowledges understanding prior to discharge.  Activity Barriers & Risk Stratification:     Activity Barriers & Cardiac Risk Stratification - 01/26/16 1638      Activity Barriers & Cardiac Risk Stratification   Activity Barriers Shortness of Breath;Assistive Device;Other (comment)   Comments Gout   Cardiac Risk Stratification High      6 Minute Walk:     6 Minute Walk    Row Name 01/26/16 1629         6 Minute Walk   Phase Initial     Distance 887 feet     Walk Time 6 minutes     # of Rest Breaks 0     MPH 1.68     METS 2.6     RPE 13     Perceived Dyspnea  3     VO2 Peak 9.09     Symptoms Yes (comment)     Comments DOE     Resting HR 90 bpm     Resting BP 118/78     Max Ex. HR 106 bpm     Max Ex. BP 128/80     2 Minute Post BP 116/78        Initial Exercise Prescription:     Initial Exercise Prescription - 01/26/16 1600      Date of Initial Exercise RX and Referring Provider   Date 01/26/16   Referring Provider Fernande Bras, MD     Bike   Level (P)  0.5   Minutes 10   METs 2.58     NuStep   Level 1   Minutes 10   METs 2     Arm Ergometer   Level 1   Watts 25   Minutes 10   METs 2.22     Prescription Details   Frequency (times per week) 3   Duration Progress to 30 minutes of continuous aerobic without signs/symptoms of physical distress     Intensity   THRR 40-80% of Max Heartrate 66-132   Ratings of Perceived Exertion 11-13   Perceived Dyspnea 0-4     Progression   Progression Continue to progress workloads to maintain intensity without signs/symptoms of physical distress.     Resistance Training   Training Prescription Yes   Weight 1   Reps 10-12      Perform Capillary Blood Glucose checks as needed.  Exercise  Prescription Changes:  Exercise Prescription Changes    Row Name 02/11/16 1600 02/25/16 1400           Response to Exercise   Blood Pressure (Admit) 118/78 120/80      Blood Pressure (Exercise) 118/80 120/82      Blood Pressure (Exit) 140/72 110/80      Heart Rate (Admit) 96 bpm 88 bpm      Heart Rate (Exercise) 100 bpm 99 bpm      Heart Rate (Exit) 92 bpm 88 bpm      Rating of Perceived Exertion (Exercise) 12 12      Duration Progress to 45 minutes of aerobic exercise without signs/symptoms of physical distress Progress to 45 minutes of aerobic exercise without signs/symptoms of physical distress      Intensity THRR unchanged THRR unchanged        Progression   Progression Continue to progress workloads to maintain intensity without signs/symptoms of physical distress. Continue to progress workloads to maintain intensity without signs/symptoms of physical distress.      Average METs 2 2        Resistance Training   Training Prescription Yes Yes      Weight 2lbs 2lbs      Reps 10-12 10-12        Bike   Level 0.5 0.5      Minutes 10 10      METs 1.88 1.88        NuStep   Level 1 1      Minutes 10 10      METs 1.8 1.9        Arm Ergometer   Level 1 1      Watts 25 25      Minutes 10 10      METs 2.22 2.22        Home Exercise Plan   Plans to continue exercise at  - Home      Frequency  - Add 2 additional days to program exercise sessions.         Exercise Comments:     Exercise Comments    Row Name 02/11/16 1136 03/02/16 0806         Exercise Comments Pt is off to a great start with exercise! Reviewed METs and goals with pt.  Pt is doing well with exercise          Discharge Exercise Prescription (Final Exercise Prescription Changes):     Exercise Prescription Changes - 02/25/16 1400      Response to Exercise   Blood Pressure (Admit) 120/80   Blood Pressure (Exercise) 120/82   Blood Pressure (Exit) 110/80   Heart Rate (Admit) 88 bpm   Heart  Rate (Exercise) 99 bpm   Heart Rate (Exit) 88 bpm   Rating of Perceived Exertion (Exercise) 12   Duration Progress to 45 minutes of aerobic exercise without signs/symptoms of physical distress   Intensity THRR unchanged     Progression   Progression Continue to progress workloads to maintain intensity without signs/symptoms of physical distress.   Average METs 2     Resistance Training   Training Prescription Yes   Weight 2lbs   Reps 10-12     Bike   Level 0.5   Minutes 10   METs 1.88     NuStep   Level 1   Minutes 10   METs 1.9     Arm Ergometer   Level 1   Watts 25  Minutes 10   METs 2.22     Home Exercise Plan   Plans to continue exercise at Home   Frequency Add 2 additional days to program exercise sessions.      Nutrition:  Target Goals: Understanding of nutrition guidelines, daily intake of sodium 1500mg , cholesterol 200mg , calories 30% from fat and 7% or less from saturated fats, daily to have 5 or more servings of fruits and vegetables.  Biometrics:     Pre Biometrics - 01/26/16 1628      Pre Biometrics   Height 5\' 10"  (1.778 m)   Weight 239 lb 6.7 oz (108.6 kg)   Waist Circumference 46 inches   Hip Circumference 41 inches   Waist to Hip Ratio 1.12 %   BMI (Calculated) 34.4   Triceps Skinfold 27 mm   % Body Fat 34.6 %   Grip Strength 42 kg   Flexibility 0 in   Single Leg Stand 2.93 seconds       Nutrition Therapy Plan and Nutrition Goals:     Nutrition Therapy & Goals - 02/10/16 1053      Nutrition Therapy   Diet Low Sodium     Personal Nutrition Goals   Personal Goal #1 1-2 lb wt loss/week to a wt loss goal of 6-24 lb at graduation from [place], educate and counsel regarding individualized specific dietary modifications aiming towards targeted core components such as weight, hypertension, lipid management, diabetes, heart failure and other comorbidities.   Expected Outcomes Short  Term Goal: Understand basic principles of dietary content, such as calories, fat, sodium, cholesterol and nutrients.;Long Term Goal: Adherence to prescribed nutrition plan.      Nutrition Discharge: Nutrition Scores:     Nutrition Assessments - 02/10/16 1053      MEDFICTS Scores   Pre Score 39      Nutrition Goals Re-Evaluation:   Psychosocial: Target Goals: Acknowledge presence or absence of depression, maximize coping skills, provide positive support system. Participant is able to verbalize types and ability to use techniques and skills needed for reducing stress and depression.  Initial Review & Psychosocial Screening:     Initial Psych Review & Screening - 01/26/16 1710      Family Dynamics   Good Support System? --  family       Quality of Life Scores:     Quality of Life - 01/26/16 1624      Quality of Life Scores   Health/Function Pre 14.93 %   Socioeconomic Pre 22.07 %   Psych/Spiritual Pre 16.07 %   Family Pre 19.5 %   GLOBAL Pre 17.31 %      PHQ-9: Recent Review Flowsheet Data    Depression screen [facility] 2/9 02/01/2016 01/31/2015 12/14/2014 10/16/2014 09/19/2014   Decreased Interest 1 0 0 0 0   Down, Depressed, Hopeless 1 0 0 0 0   PHQ - 2 Score 2 0 0 0 0   Altered sleeping 1 - - - -   Tired, decreased energy 1 - - - -   Change in appetite 0 - - - -   Feeling bad or failure about yourself  1 - - - -   Trouble concentrating 1 - - - -   Moving slowly or fidgety/restless 0 - - - -   Suicidal thoughts 0 - - - -   PHQ-9 Score 6 - - - -      Psychosocial Evaluation and  Intervention:     Psychosocial Evaluation - 03/10/16 1305      Psychosocial Evaluation & Interventions   Interventions Encouraged to exercise with the program and follow exercise prescription   Comments Making slow progress.  appears to be more upbeat and positive.  interacting with fellow participants   Continued Psychosocial Services Needed No     Discharge Psychosocial Assessment  & Intervention   Discharge Continue support measures as needed      Psychosocial Re-Evaluation:     Psychosocial Re-Evaluation    Row Name 02/11/16 1320 03/10/16 1306           Psychosocial Re-Evaluation   Interventions Stress management education;Relaxation education;Encouraged to attend Cardiac Rehabilitation for the exercise Encouraged to attend Cardiac Rehabilitation for the exercise      Comments  - supportive family, making slow progress toward goals.      Continued Psychosocial Services Needed  - No         Vocational Rehabilitation: Provide vocational rehab assistance to qualifying candidates.   Vocational Rehab Evaluation & Intervention:     Vocational Rehab - 01/26/16 1616      Initial Vocational Rehab Evaluation & Intervention   Assessment shows need for Vocational Rehabilitation No      Education: Education Goals: Education classes will be provided on a weekly basis, covering required topics. Participant will state understanding/return demonstration of topics presented.  Learning Barriers/Preferences:     Learning Barriers/Preferences - 01/26/16 1639      Learning Barriers/Preferences   Learning Barriers Sight      Education Topics: Count Your Pulse:  -Group instruction provided by verbal instruction, demonstration, patient participation and written materials to support subject.  Instructors address importance of being able to find your pulse and how to count your pulse when at home without a heart monitor.  Patients get hands on experience counting their pulse with staff help and individually.   Heart Attack, Angina, and Risk Factor Modification:  -Group instruction provided by verbal instruction, video, and written materials to support subject.  Instructors address signs and symptoms of angina and heart attacks.    Also discuss risk factors for heart disease and how to make changes to improve heart health risk factors.   Functional Fitness:   -Group instruction provided by verbal instruction, demonstration, patient participation, and written materials to support subject.  Instructors address safety measures for doing things around the house.  Discuss how to get up and down off the floor, how to pick things up properly, how to safely get out of a chair without assistance, and balance training.   Meditation and Mindfulness:  -Group instruction provided by verbal instruction, patient participation, and written materials to support subject.  Instructor addresses importance of mindfulness and meditation practice to help reduce stress and improve awareness.  Instructor also leads participants through a meditation exercise.    Stretching for Flexibility and Mobility:  -Group instruction provided by verbal instruction, patient participation, and written materials to support subject.  Instructors lead participants through series of stretches that are designed to increase flexibility thus improving mobility.  These stretches are additional exercise for major muscle groups that are typically performed during regular warm up and cool down.   Hands Only CPR Anytime:  -Group instruction provided by verbal instruction, video, patient participation and written materials to support subject.  Instructors co-teach with AHA video for hands only CPR.  Participants get hands on experience with mannequins.   Nutrition I class: Heart Healthy Eating:  -  Group instruction provided by PowerPoint slides, verbal discussion, and written materials to support subject matter. The instructor gives an explanation and review of the Therapeutic Lifestyle Changes diet recommendations, which includes a discussion on lipid goals, dietary fat, sodium, fiber, plant stanol/sterol esters, sugar, and the components of a well-balanced, healthy diet.   Nutrition II class: Lifestyle Skills:  -Group instruction provided by PowerPoint slides, verbal discussion, and written materials  to support subject matter. The instructor gives an explanation and review of label reading, grocery shopping for heart health, heart healthy recipe modifications, and ways to make healthier choices when eating out.   Diabetes Question & Answer:  -Group instruction provided by PowerPoint slides, verbal discussion, and written materials to support subject matter. The instructor gives an explanation and review of diabetes co-morbidities, pre- and post-prandial blood glucose goals, pre-exercise blood glucose goals, signs, symptoms, and treatment of hypoglycemia and hyperglycemia, and foot care basics.   Diabetes Blitz:  -Group instruction provided by PowerPoint slides, verbal discussion, and written materials to support subject matter. The instructor gives an explanation and review of the physiology behind type 1 and type 2 diabetes, diabetes medications and rational behind using different medications, pre- and post-prandial blood glucose recommendations and Hemoglobin A1c goals, diabetes diet, and exercise including blood glucose guidelines for exercising safely.    Portion Distortion:  -Group instruction provided by PowerPoint slides, verbal discussion, written materials, and food models to support subject matter. The instructor gives an explanation of serving size versus portion size, changes in portions sizes over the last 20 years, and what consists of a serving from each food group.   Stress Management:  -Group instruction provided by verbal instruction, video, and written materials to support subject matter.  Instructors review role of stress in heart disease and how to cope with stress positively.     Exercising on Your Own:  -Group instruction provided by verbal instruction, power point, and written materials to support subject.  Instructors discuss benefits of exercise, components of exercise, frequency and intensity of exercise, and end points for exercise.  Also discuss use of  nitroglycerin and activating EMS.  Review options of places to exercise outside of rehab.  Review guidelines for sex with heart disease.   Cardiac Drugs I:  -Group instruction provided by verbal instruction and written materials to support subject.  Instructor reviews cardiac drug classes: antiplatelets, anticoagulants, beta blockers, and statins.  Instructor discusses reasons, side effects, and lifestyle considerations for each drug class.   Cardiac Drugs II:  -Group instruction provided by verbal instruction and written materials to support subject.  Instructor reviews cardiac drug classes: angiotensin converting enzyme inhibitors (ACE-I), angiotensin II receptor blockers (ARBs), nitrates, and calcium channel blockers.  Instructor discusses reasons, side effects, and lifestyle considerations for each drug class.   Anatomy and Physiology of the Circulatory System:  -Group instruction provided by verbal instruction, video, and written materials to support subject.  Reviews functional anatomy of heart, how it relates to various diagnoses, and what role the heart plays in the overall system. Flowsheet Row CARDIAC REHAB PHASE II EXERCISE from 02/24/2016 in [place]  Date  02/24/16  Instruction Review Code  2- meets goals/outcomes      Knowledge Questionnaire Score:     Knowledge Questionnaire Score - 01/27/16 1636      Knowledge Questionnaire Score   Pre Score 17/24      Core Components/Risk Factors/Patient Goals at Admission:     Personal Goals and Risk  Factors at Admission - 01/26/16 1634      Core Components/Risk Factors/Patient Goals on Admission    Weight Management Yes;Obesity   Intervention Weight Management: Develop a combined nutrition and exercise program designed to reach desired caloric intake, while maintaining appropriate intake of nutrient and fiber, sodium and fats, and appropriate energy expenditure required for the weight  goal.;Weight Management: Provide education and appropriate resources to help participant work on and attain dietary goals.;Weight Management/Obesity: Establish reasonable short term and long term weight goals.;Obesity: Provide education and appropriate resources to help participant work on and attain dietary goals.   Admit Weight 239 lb 6.7 oz (108.6 kg)   Goal Weight: Short Term 233 lb (105.7 kg)   Goal Weight: Long Term 229 lb (103.9 kg)   Expected Outcomes Short Term: Continue to assess and modify interventions until short term weight is achieved;Long Term: Adherence to nutrition and physical activity/exercise program aimed toward attainment of established weight goal;Weight Loss: Understanding of general recommendations for a balanced deficit meal plan, which promotes 1-2 lb weight loss per week and includes a negative energy balance of [phone] kcal/d   Sedentary Yes   Intervention Provide advice, education, support and counseling about physical activity/exercise needs.;Develop an individualized exercise prescription for aerobic and resistive training based on initial evaluation findings, risk stratification, comorbidities and participant's personal goals.   Expected Outcomes Achievement of increased cardiorespiratory fitness and enhanced flexibility, muscular endurance and strength shown through measurements of functional capacity and personal statement of participant.   Hypertension Yes   Intervention Provide education on lifestyle modifcations including regular physical activity/exercise, weight management, moderate sodium restriction and increased consumption of fresh fruit, vegetables, and low fat dairy, alcohol moderation, and smoking cessation.;Monitor prescription use compliance.   Expected Outcomes Short Term: Continued assessment and intervention until BP is < 140/90mm HG in hypertensive participants. < 130/51mm HG in hypertensive participants with diabetes, heart failure or chronic kidney  disease.;Long Term: Maintenance of blood pressure at goal levels.   Lipids Yes   Intervention Provide education and support for participant on nutrition & aerobic/resistive exercise along with prescribed medications to achieve LDL 70mg , HDL >40mg .   Expected Outcomes Short Term: Participant states understanding of desired cholesterol values and is compliant with medications prescribed. Participant is following exercise prescription and nutrition guidelines.;Long Term: Cholesterol controlled with medications as prescribed, with individualized exercise RX and with personalized nutrition plan. Value goals: LDL < 70mg , HDL > 40 mg.   Personal Goal Other Yes   Personal Goal Get heart stronger, so he can have diaphragm suregery.   Intervention Develop aerobic and resistance training program to increase functional capacity and cardiorespiratory fitness.    Expected Outcomes Improve functional capacity as measured by 6 minute walk test. Have diaphragm surgery once cleared by physician.      Core Components/Risk Factors/Patient Goals Review:      Goals and Risk Factor Review    Row Name 02/25/16 1418             Core Components/Risk Factors/Patient Goals Review   Personal Goals Review Increase Strength and Stamina;Improve shortness of breath with ADL's       Review pt would absent from [facility] for 1 week due to illness. He has returned and is slowly gaining momentum again.  Will continue to follow and increase workloads as tolerated.  Pt should begin HEP immediately       Expected Outcomes Begin HEP and have good attendance in order to increase strengh, stamina and improve overall  cardiovascular fitness           Core Components/Risk Factors/Patient Goals at Discharge (Final Review):      Goals and Risk Factor Review - 02/25/16 1418      Core Components/Risk Factors/Patient Goals Review   Personal Goals Review Increase Strength and Stamina;Improve shortness of breath with ADL's   Review pt  would absent from [facility] for 1 week due to illness. He has returned and is slowly gaining momentum again.  Will continue to follow and increase workloads as tolerated.  Pt should begin HEP immediately   Expected Outcomes Begin HEP and have good attendance in order to increase strengh, stamina and improve overall cardiovascular fitness       ITP Comments:     ITP Comments    Row Name 01/26/16 1333           ITP Comments Medical Director- Dr. Fransico Him, MD.          Comments:  Pt is making expected progress toward personal goals after completing  13 sessions.Repeat Psychosocial Assessment reveals improvement with outlook from previous assessment. Pt interacting positively with fellow participants.  Pt feels he is making  Slow progress toward meeting his goals. Continues to work on incorporating more activity on his non rehab days.Recommend continued exercise and life style modification education including  stress management and relaxation techniques to decrease cardiac risk profile. Cherre Huger, BSN

## 2016-03-11 ENCOUNTER — Encounter (HOSPITAL_COMMUNITY)
Admission: RE | Admit: 2016-03-11 | Discharge: 2016-03-11 | Disposition: A | Discharge status: Home / Self Care | Payer: 59 | Source: Ambulatory Visit | Attending: Cardiology | Admitting: Cardiology

## 2016-03-11 DIAGNOSIS — I5022 Chronic systolic (congestive) heart failure: Secondary | ICD-10-CM

## 2016-03-13 ENCOUNTER — Other Ambulatory Visit (HOSPITAL_COMMUNITY): Payer: Self-pay | Admitting: Adult Health

## 2016-03-14 ENCOUNTER — Encounter (HOSPITAL_COMMUNITY)
Admission: RE | Admit: 2016-03-14 | Discharge: 2016-03-14 | Disposition: A | Discharge status: Home / Self Care | Payer: 59 | Source: Ambulatory Visit | Attending: Cardiology | Admitting: Cardiology

## 2016-03-14 DIAGNOSIS — I5022 Chronic systolic (congestive) heart failure: Secondary | ICD-10-CM

## 2016-03-15 ENCOUNTER — Ambulatory Visit (INDEPENDENT_AMBULATORY_CARE_PROVIDER_SITE_OTHER): Payer: 59

## 2016-03-15 DIAGNOSIS — Z95810 Presence of automatic (implantable) cardiac defibrillator: Secondary | ICD-10-CM

## 2016-03-15 DIAGNOSIS — I5042 Chronic combined systolic (congestive) and diastolic (congestive) heart failure: Secondary | ICD-10-CM

## 2016-03-15 NOTE — Progress Notes (Signed)
EPIC Encounter for ICM Monitoring  Patient Name: [name] is a 56 y.o. male Date: 03/15/2016 Primary Care Physican: Donetta Potts, MD Primary Cardiologist:[place] Electrophysiologist: Caryl Comes Dry [id number]:973 lbs Bi-V Pacing: 94.3%        Heart Failure questions reviewed, pt asymptomatic   Thoracic impedance normal after taking extra Torsemide. He reported the kidney specialist instructed him at office visit on 1/5 to stop the Torsemide for 3 days because he was getting to dry.  Kidney physician showed him a graph and explained he should keep his weight at 234 lbs which is best weight for his kidneys.  He is back to taking the Furosemide every other day.   Labs: 02/01/2016 Creatinine 1.67, BUN 27, Potassium 4.9, Sodium 138, EGFR 43 - Care Everywhere results 06/26/2015 Creatinine 1.55, BUN 27, Potassium 5.3, Sodium 136, EGFR 49-57 03/10/2015 Creatinine 1.71, BUN 21, Potassium 4.2, Sodium 139    Recommendations:  No changes today.  Encouraged to call for fluid symptoms.    Follow-up plan: ICM clinic phone appointment on 04/11/2016.  Copy of ICM check sent to device physician.   3 month ICM trend : 03/15/2016   1 Year ICM trend:      Rosalene Billings, RN 03/15/2016 9:59 AM

## 2016-03-16 ENCOUNTER — Encounter (HOSPITAL_COMMUNITY)
Admission: RE | Admit: 2016-03-16 | Discharge: 2016-03-16 | Disposition: A | Discharge status: Home / Self Care | Payer: 59 | Source: Ambulatory Visit | Attending: Cardiology | Admitting: Cardiology

## 2016-03-16 DIAGNOSIS — I5022 Chronic systolic (congestive) heart failure: Secondary | ICD-10-CM | POA: Diagnosis not present

## 2016-03-18 ENCOUNTER — Encounter (HOSPITAL_COMMUNITY)
Admission: RE | Admit: 2016-03-18 | Discharge: 2016-03-18 | Disposition: A | Discharge status: Home / Self Care | Payer: 59 | Source: Ambulatory Visit | Attending: Cardiology | Admitting: Cardiology

## 2016-03-18 DIAGNOSIS — I5022 Chronic systolic (congestive) heart failure: Secondary | ICD-10-CM

## 2016-03-21 ENCOUNTER — Encounter (HOSPITAL_COMMUNITY): Payer: 59

## 2016-03-23 ENCOUNTER — Encounter (HOSPITAL_COMMUNITY): Payer: 59

## 2016-03-25 ENCOUNTER — Encounter (HOSPITAL_COMMUNITY): Payer: 59

## 2016-03-28 ENCOUNTER — Encounter (HOSPITAL_COMMUNITY)
Admission: RE | Admit: 2016-03-28 | Discharge: 2016-03-28 | Disposition: A | Discharge status: Home / Self Care | Payer: 59 | Source: Ambulatory Visit | Attending: Cardiology | Admitting: Cardiology

## 2016-03-28 DIAGNOSIS — I5022 Chronic systolic (congestive) heart failure: Secondary | ICD-10-CM | POA: Diagnosis not present

## 2016-03-30 ENCOUNTER — Encounter (HOSPITAL_COMMUNITY)
Admission: RE | Admit: 2016-03-30 | Discharge: 2016-03-30 | Disposition: A | Discharge status: Home / Self Care | Payer: 59 | Source: Ambulatory Visit | Attending: Cardiology | Admitting: Cardiology

## 2016-03-30 DIAGNOSIS — I5022 Chronic systolic (congestive) heart failure: Secondary | ICD-10-CM

## 2016-04-01 ENCOUNTER — Other Ambulatory Visit: Payer: Self-pay | Admitting: Cardiothoracic Surgery

## 2016-04-01 ENCOUNTER — Encounter (HOSPITAL_COMMUNITY)
Admission: RE | Admit: 2016-04-01 | Discharge: 2016-04-01 | Disposition: A | Discharge status: Home / Self Care | Payer: 59 | Source: Ambulatory Visit | Attending: Cardiology | Admitting: Cardiology

## 2016-04-01 DIAGNOSIS — I5022 Chronic systolic (congestive) heart failure: Secondary | ICD-10-CM | POA: Diagnosis not present

## 2016-04-01 DIAGNOSIS — J9859 Other diseases of mediastinum, not elsewhere classified: Secondary | ICD-10-CM

## 2016-04-04 ENCOUNTER — Encounter (HOSPITAL_COMMUNITY): Payer: 59

## 2016-04-06 ENCOUNTER — Encounter (HOSPITAL_COMMUNITY)
Admission: RE | Admit: 2016-04-06 | Discharge: 2016-04-06 | Disposition: A | Discharge status: Home / Self Care | Payer: 59 | Source: Ambulatory Visit | Attending: Cardiology | Admitting: Cardiology

## 2016-04-07 ENCOUNTER — Telehealth: Payer: Self-pay | Admitting: Cardiology

## 2016-04-07 ENCOUNTER — Ambulatory Visit (INDEPENDENT_AMBULATORY_CARE_PROVIDER_SITE_OTHER): Payer: 59

## 2016-04-07 DIAGNOSIS — Z95810 Presence of automatic (implantable) cardiac defibrillator: Secondary | ICD-10-CM

## 2016-04-07 DIAGNOSIS — I5042 Chronic combined systolic (congestive) and diastolic (congestive) heart failure: Secondary | ICD-10-CM

## 2016-04-07 NOTE — Telephone Encounter (Signed)
Spoke with patient.  Provided direct number and for future if he send remote transmission that needs to be reviewed to call.  See ICM note

## 2016-04-07 NOTE — Telephone Encounter (Signed)
Pt called and stated that he sent a remote transmission on 04-06-16. I informed pt that we did receive it. He wanted to see if there was any abnormalities b/c he is having difficulty breathing and has gained 1-2 lbs. Please advise and call back. Pt aware it may be Friday before he receives a call back.

## 2016-04-07 NOTE — Progress Notes (Signed)
Cardiac Individual Treatment Plan  Patient Details  Name: [name] MRN: [id number] Date of Birth: 1960/09/29 Referring Provider:   Flowsheet Row CARDIAC REHAB PHASE II ORIENTATION from 01/26/2016 in [facility]  Referring Provider  Fernande Bras, MD      Initial Encounter Date:  [facility] PHASE II ORIENTATION from 01/26/2016 in [facility]  Date  01/26/16  Referring Provider  Fernande Bras, MD      Visit Diagnosis: Heart failure, chronic systolic ([place])  Patient's Home Medications on Admission:  Current Outpatient Prescriptions:  .  atorvastatin (LIPITOR) 40 MG tablet, TAKE 1 TABLET BY MOUTH AT BEDTIME, Disp: 30 tablet, Rfl: 2 .  BREO ELLIPTA 100-25 MCG/INH AEPB, Inhale 1 puff into the lungs daily., Disp: , Rfl: 11 .  carvedilol (COREG) 6.25 MG tablet, Take 1 tablet (6.25 mg total) by mouth 2 (two) times daily. (Patient taking differently: Take 9.375 mg by mouth 2 (two) times daily. ), Disp: 60 tablet, Rfl: 11 .  digoxin (LANOXIN) 0.25 MG tablet, TAKE 1/2 TAB (0.125 MG TOTAL) BY MOUTH DAILY., Disp: 15 tablet, Rfl: 3 .  Febuxostat (ULORIC) 80 MG TABS, Take 80 mg by mouth at bedtime., Disp: , Rfl:  .  gabapentin (NEURONTIN) 300 MG capsule, Take 1 capsule by mouth 3 (three) times daily. Pain, Disp: , Rfl:  .  hydrALAZINE (APRESOLINE) 100 MG tablet, Take 100 mg by mouth 2 (two) times daily., Disp: , Rfl:  .  magnesium oxide (MAG-OX) 400 MG tablet, Take two tablets (800 mg) by mouth twice daily, Disp: 120 tablet, Rfl: 11 .  MITIGARE 0.6 MG CAPS, Take 0.6 mg by mouth daily., Disp: 30 capsule, Rfl: 6 .  tobramycin-dexamethasone (TOBRADEX) ophthalmic solution, Place 2 drops into the right eye 2 (two) times daily., Disp: , Rfl: 5 .  torsemide (DEMADEX) 10 MG tablet, Take 10 mg by mouth daily. Taking 10 mg every other day., Disp: , Rfl:   Past Medical History: Past Medical History:  Diagnosis Date  .  Arthritis    GOUT  . Automatic implantable cardioverter-defibrillator in situ   . Biventricular ICD (implantable cardiac defibrillator) [employer]onic ]    DOI 2008/ upgrade 2010/ Gen Change 2014  . Bladder tumor    PT HOSP AT [facility] 4/24 TO 4/26 2014 WITH UTI--AND FOUND TO HAVE BLADDER TUMOR-  . Cardiomyopathy    Idiopathic dilated;   . CHF (congestive heart failure) ([place])   . CKD (chronic kidney disease)    stage III baseline Crt 1.8-2.0  . Coronary artery disease   . Gout 08/03/12   PT C/O OF RIGHT KNEE PAIN AND SWELLING -STATES GOUT FLARE UP - ONGOING SINCE APRIL - BUT SWELLING W/IN LAST WEEK  . Hilar mass    Noted CT 2010  . HTN (hypertension)    Dr. Caryl Comes cardiologist  . Hypothyroidism   . 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 Sleep apnea    CPAP  . Systolic CHF ([place])    EF 0000000  . Ventricular tachycardia ([place])    s/p ICD    Tobacco Use: History  Smoking Status  . Never Smoker  Smokeless Tobacco  . Never Used    Labs: Recent Review Flowsheet Data    Labs for ITP Cardiac and Pulmonary Rehab Latest Ref Rng & Units 02/14/2014 02/14/2014 02/15/2014 02/18/2014 02/21/2014   Hemoglobin A1c <5.7 % - - - - -  PHART 7.350 - 7.450 - - - - -   PCO2ART 35.0 - 45.0 mmHg - - - - -   HCO3 20.0 - 24.0 mEq/L - - - - -   TCO2 0 - 100 mmol/L 17 19 - - -   ACIDBASEDEF 0.0 - 2.0 mmol/L - - - - -   O2SAT % - - 64.6 58.2 58.1      Capillary Blood Glucose: Lab Results  Component Value Date   GLUCAP 98 02/22/2014   GLUCAP 125 (H) 02/22/2014   GLUCAP 147 (H) 02/21/2014   GLUCAP 136 (H) 02/21/2014   GLUCAP 130 (H) 02/21/2014     Exercise Target Goals:    Exercise Program Goal: Individual exercise prescription set with THRR, safety & activity barriers. Participant demonstrates ability to understand and report RPE using BORG scale, to self-measure pulse accurately, and to acknowledge the importance of the exercise  prescription.  Exercise Prescription Goal: Starting with aerobic activity 30 plus minutes a day, 3 days per week for initial exercise prescription. Provide home exercise prescription and guidelines that participant acknowledges understanding prior to discharge.  Activity Barriers & Risk Stratification:     Activity Barriers & Cardiac Risk Stratification - 01/26/16 1638      Activity Barriers & Cardiac Risk Stratification   Activity Barriers Shortness of Breath;Assistive Device;Other (comment)   Comments Gout   Cardiac Risk Stratification High      6 Minute Walk:     6 Minute Walk    Row Name 01/26/16 1629         6 Minute Walk   Phase Initial     Distance 887 feet     Walk Time 6 minutes     # of Rest Breaks 0     MPH 1.68     METS 2.6     RPE 13     Perceived Dyspnea  3     VO2 Peak 9.09     Symptoms Yes (comment)     Comments DOE     Resting HR 90 bpm     Resting BP 118/78     Max Ex. HR 106 bpm     Max Ex. BP 128/80     2 Minute Post BP 116/78        Initial Exercise Prescription:     Initial Exercise Prescription - 01/26/16 1600      Date of Initial Exercise RX and Referring Provider   Date 01/26/16   Referring Provider Fernande Bras, MD     Bike   Level (P)  0.5   Minutes 10   METs 2.58     NuStep   Level 1   Minutes 10   METs 2     Arm Ergometer   Level 1   Watts 25   Minutes 10   METs 2.22     Prescription Details   Frequency (times per week) 3   Duration Progress to 30 minutes of continuous aerobic without signs/symptoms of physical distress     Intensity   THRR 40-80% of Max Heartrate 66-132   Ratings of Perceived Exertion 11-13   Perceived Dyspnea 0-4     Progression   Progression Continue to progress workloads to maintain intensity without signs/symptoms of physical distress.     Resistance Training   Training Prescription Yes   Weight 1   Reps 10-12      Perform Capillary Blood Glucose checks as needed.  Exercise  Prescription Changes:  Exercise Prescription Changes    Row Name 02/11/16 1600 02/25/16 1400 04/04/16 1100         Response to Exercise   Blood Pressure (Admit) 118/78 120/80 134/84     Blood Pressure (Exercise) 118/80 120/82 122/84     Blood Pressure (Exit) 140/72 110/80 118/80     Heart Rate (Admit) 96 bpm 88 bpm 99 bpm     Heart Rate (Exercise) 100 bpm 99 bpm 107 bpm     Heart Rate (Exit) 92 bpm 88 bpm 92 bpm     Rating of Perceived Exertion (Exercise) 12 12 12      Duration Progress to 45 minutes of aerobic exercise without signs/symptoms of physical distress Progress to 45 minutes of aerobic exercise without signs/symptoms of physical distress Progress to 45 minutes of aerobic exercise without signs/symptoms of physical distress     Intensity THRR unchanged THRR unchanged THRR unchanged       Progression   Progression Continue to progress workloads to maintain intensity without signs/symptoms of physical distress. Continue to progress workloads to maintain intensity without signs/symptoms of physical distress. Continue to progress workloads to maintain intensity without signs/symptoms of physical distress.     Average METs 2 2 2.2       Resistance Training   Training Prescription Yes Yes Yes     Weight 2lbs 2lbs 2lbs     Reps 10-12 10-12 10-12       Bike   Level 0.5 0.5  -     Minutes 10 10  -     METs 1.88 1.88  -       NuStep   Level 1 1 3      Minutes 10 10 20      METs 1.8 1.9 2.4       Arm Ergometer   Level 1 1 1      Watts 25 25 35     Minutes 10 10 10      METs 2.22 2.22 2.66       Home Exercise Plan   Plans to continue exercise at  - Home Home     Frequency  - Add 2 additional days to program exercise sessions. Add 2 additional days to program exercise sessions.        Exercise Comments:     Exercise Comments    Row Name 02/11/16 1136 03/02/16 0806 04/04/16 1106       Exercise Comments Pt is off to a great start with exercise! Reviewed METs and  goals with pt.  Pt is doing well with exercise  Reviewed METs and goals with pt.  Pt is doing well with exercise         Discharge Exercise Prescription (Final Exercise Prescription Changes):     Exercise Prescription Changes - 04/04/16 1100      Response to Exercise   Blood Pressure (Admit) 134/84   Blood Pressure (Exercise) 122/84   Blood Pressure (Exit) 118/80   Heart Rate (Admit) 99 bpm   Heart Rate (Exercise) 107 bpm   Heart Rate (Exit) 92 bpm   Rating of Perceived Exertion (Exercise) 12   Duration Progress to 45 minutes of aerobic exercise without signs/symptoms of physical distress   Intensity THRR unchanged     Progression   Progression Continue to progress workloads to maintain intensity without signs/symptoms of physical distress.   Average METs 2.2     Resistance Training   Training Prescription Yes   Weight 2lbs   Reps 10-12  NuStep   Level 3   Minutes 20   METs 2.4     Arm Ergometer   Level 1   Watts 35   Minutes 10   METs 2.66     Home Exercise Plan   Plans to continue exercise at Home   Frequency Add 2 additional days to program exercise sessions.      Nutrition:  Target Goals: Understanding of nutrition guidelines, daily intake of sodium 1500mg , cholesterol 200mg , calories 30% from fat and 7% or less from saturated fats, daily to have 5 or more servings of fruits and vegetables.  Biometrics:     Pre Biometrics - 01/26/16 1628      Pre Biometrics   Height 5\' 10"  ([id number] m)   Weight 239 lb 6.7 oz (108.6 kg)   Waist Circumference 46 inches   Hip Circumference 41 inches   Waist to Hip Ratio 1.12 %   BMI (Calculated) 34.4   Triceps Skinfold 27 mm   % Body Fat 34.6 %   Grip Strength 42 kg   Flexibility 0 in   Single Leg Stand 2.93 seconds       Nutrition Therapy Plan and Nutrition Goals:     Nutrition Therapy & Goals - 02/10/16 1053      Nutrition Therapy   Diet Low Sodium     Personal Nutrition Goals   Personal Goal #1 1-2  lb wt loss/week to a wt loss goal of 6-24 lb at graduation from [place], educate and counsel regarding individualized specific dietary modifications aiming towards targeted core components such as weight, hypertension, lipid management, diabetes, heart failure and other comorbidities.   Expected Outcomes Short Term Goal: Understand basic principles of dietary content, such as calories, fat, sodium, cholesterol and nutrients.;Long Term Goal: Adherence to prescribed nutrition plan.      Nutrition Discharge: Nutrition Scores:     Nutrition Assessments - 02/10/16 1053      MEDFICTS Scores   Pre Score 39      Nutrition Goals Re-Evaluation:   Psychosocial: Target Goals: Acknowledge presence or absence of depression, maximize coping skills, provide positive support system. Participant is able to verbalize types and ability to use techniques and skills needed for reducing stress and depression.  Initial Review & Psychosocial Screening:     Initial Psych Review & Screening - 01/26/16 1710      Family Dynamics   Good Support System? --  family       Quality of Life Scores:     Quality of Life - 01/26/16 1624      Quality of Life Scores   Health/Function Pre 14.93 %   Socioeconomic Pre 22.07 %   Psych/Spiritual Pre 16.07 %   Family Pre 19.5 %   GLOBAL Pre 17.31 %      PHQ-9: Recent Review Flowsheet Data    Depression screen [facility] 2/9 02/01/2016 01/31/2015 12/14/2014 10/16/2014 09/19/2014   Decreased Interest 1 0 0 0 0   Down, Depressed, Hopeless 1 0 0 0 0   PHQ - 2 Score 2 0 0 0 0   Altered sleeping 1 - - - -   Tired, decreased energy 1 - - - -   Change in appetite 0 - - - -   Feeling bad or failure about yourself  1 - - - -   Trouble concentrating 1 - - - -   Moving slowly or fidgety/restless 0 - - - -  Suicidal thoughts 0 - - - -   PHQ-9 Score 6 - - - -      Psychosocial Evaluation and Intervention:      Psychosocial Evaluation - 04/07/16 1450      Psychosocial Evaluation & Interventions   Interventions Encouraged to exercise with the program and follow exercise prescription;Stress management education;Relaxation education   Comments Making slow progress.  appears to be more upbeat and positive.  interacting with fellow participants   Continued Psychosocial Services Needed No     Discharge Psychosocial Assessment & Intervention   Discharge Continue support measures as needed      Psychosocial Re-Evaluation:     Psychosocial Re-Evaluation    Row Name 02/11/16 1320 03/10/16 1306 04/07/16 1450         Psychosocial Re-Evaluation   Interventions Stress management education;Relaxation education;Encouraged to attend Cardiac Rehabilitation for the exercise Encouraged to attend Cardiac Rehabilitation for the exercise Encouraged to attend Cardiac Rehabilitation for the exercise     Comments  - supportive family, making slow progress toward goals. supportive family, making slow progress toward goals.     Continued Psychosocial Services Needed  - No No        Vocational Rehabilitation: Provide vocational rehab assistance to qualifying candidates.   Vocational Rehab Evaluation & Intervention:     Vocational Rehab - 01/26/16 1616      Initial Vocational Rehab Evaluation & Intervention   Assessment shows need for Vocational Rehabilitation No      Education: Education Goals: Education classes will be provided on a weekly basis, covering required topics. Participant will state understanding/return demonstration of topics presented.  Learning Barriers/Preferences:     Learning Barriers/Preferences - 01/26/16 1639      Learning Barriers/Preferences   Learning Barriers Sight      Education Topics: Count Your Pulse:  -Group instruction provided by verbal instruction, demonstration, patient participation and written materials to support subject.  Instructors address importance of  being able to find your pulse and how to count your pulse when at home without a heart monitor.  Patients get hands on experience counting their pulse with staff help and individually.   Heart Attack, Angina, and Risk Factor Modification:  -Group instruction provided by verbal instruction, video, and written materials to support subject.  Instructors address signs and symptoms of angina and heart attacks.    Also discuss risk factors for heart disease and how to make changes to improve heart health risk factors.   Functional Fitness:  -Group instruction provided by verbal instruction, demonstration, patient participation, and written materials to support subject.  Instructors address safety measures for doing things around the house.  Discuss how to get up and down off the floor, how to pick things up properly, how to safely get out of a chair without assistance, and balance training.   Meditation and Mindfulness:  -Group instruction provided by verbal instruction, patient participation, and written materials to support subject.  Instructor addresses importance of mindfulness and meditation practice to help reduce stress and improve awareness.  Instructor also leads participants through a meditation exercise.    Stretching for Flexibility and Mobility:  -Group instruction provided by verbal instruction, patient participation, and written materials to support subject.  Instructors lead participants through series of stretches that are designed to increase flexibility thus improving mobility.  These stretches are additional exercise for major muscle groups that are typically performed during regular warm up and cool down.   Hands Only CPR Anytime:  -Group instruction  provided by verbal instruction, video, patient participation and written materials to support subject.  Instructors co-teach with AHA video for hands only CPR.  Participants get hands on experience with mannequins.   Nutrition I  class: Heart Healthy Eating:  -Group instruction provided by PowerPoint slides, verbal discussion, and written materials to support subject matter. The instructor gives an explanation and review of the Therapeutic Lifestyle Changes diet recommendations, which includes a discussion on lipid goals, dietary fat, sodium, fiber, plant stanol/sterol esters, sugar, and the components of a well-balanced, healthy diet.   Nutrition II class: Lifestyle Skills:  -Group instruction provided by PowerPoint slides, verbal discussion, and written materials to support subject matter. The instructor gives an explanation and review of label reading, grocery shopping for heart health, heart healthy recipe modifications, and ways to make healthier choices when eating out.   Diabetes Question & Answer:  -Group instruction provided by PowerPoint slides, verbal discussion, and written materials to support subject matter. The instructor gives an explanation and review of diabetes co-morbidities, pre- and post-prandial blood glucose goals, pre-exercise blood glucose goals, signs, symptoms, and treatment of hypoglycemia and hyperglycemia, and foot care basics.   Diabetes Blitz:  -Group instruction provided by PowerPoint slides, verbal discussion, and written materials to support subject matter. The instructor gives an explanation and review of the physiology behind type 1 and type 2 diabetes, diabetes medications and rational behind using different medications, pre- and post-prandial blood glucose recommendations and Hemoglobin A1c goals, diabetes diet, and exercise including blood glucose guidelines for exercising safely.    Portion Distortion:  -Group instruction provided by PowerPoint slides, verbal discussion, written materials, and food models to support subject matter. The instructor gives an explanation of serving size versus portion size, changes in portions sizes over the last 20 years, and what consists of a serving  from each food group.   Stress Management:  -Group instruction provided by verbal instruction, video, and written materials to support subject matter.  Instructors review role of stress in heart disease and how to cope with stress positively.     Exercising on Your Own:  -Group instruction provided by verbal instruction, power point, and written materials to support subject.  Instructors discuss benefits of exercise, components of exercise, frequency and intensity of exercise, and end points for exercise.  Also discuss use of nitroglycerin and activating EMS.  Review options of places to exercise outside of rehab.  Review guidelines for sex with heart disease.   Cardiac Drugs I:  -Group instruction provided by verbal instruction and written materials to support subject.  Instructor reviews cardiac drug classes: antiplatelets, anticoagulants, beta blockers, and statins.  Instructor discusses reasons, side effects, and lifestyle considerations for each drug class.   Cardiac Drugs II:  -Group instruction provided by verbal instruction and written materials to support subject.  Instructor reviews cardiac drug classes: angiotensin converting enzyme inhibitors (ACE-I), angiotensin II receptor blockers (ARBs), nitrates, and calcium channel blockers.  Instructor discusses reasons, side effects, and lifestyle considerations for each drug class.   Anatomy and Physiology of the Circulatory System:  -Group instruction provided by verbal instruction, video, and written materials to support subject.  Reviews functional anatomy of heart, how it relates to various diagnoses, and what role the heart plays in the overall system. Flowsheet Row CARDIAC REHAB PHASE II EXERCISE from 02/24/2016 in [place]  Date  02/24/16  Instruction Review Code  2- meets goals/outcomes      Knowledge Questionnaire Score:  Knowledge Questionnaire Score - 01/27/16 1636      Knowledge  Questionnaire Score   Pre Score 17/24      Core Components/Risk Factors/Patient Goals at Admission:     Personal Goals and Risk Factors at Admission - 01/26/16 1634      Core Components/Risk Factors/Patient Goals on Admission    Weight Management Yes;Obesity   Intervention Weight Management: Develop a combined nutrition and exercise program designed to reach desired caloric intake, while maintaining appropriate intake of nutrient and fiber, sodium and fats, and appropriate energy expenditure required for the weight goal.;Weight Management: Provide education and appropriate resources to help participant work on and attain dietary goals.;Weight Management/Obesity: Establish reasonable short term and long term weight goals.;Obesity: Provide education and appropriate resources to help participant work on and attain dietary goals.   Admit Weight 239 lb 6.7 oz (108.6 kg)   Goal Weight: Short Term 233 lb (105.7 kg)   Goal Weight: Long Term 229 lb (103.9 kg)   Expected Outcomes Short Term: Continue to assess and modify interventions until short term weight is achieved;Long Term: Adherence to nutrition and physical activity/exercise program aimed toward attainment of established weight goal;Weight Loss: Understanding of general recommendations for a balanced deficit meal plan, which promotes 1-2 lb weight loss per week and includes a negative energy balance of [phone] kcal/d   Sedentary Yes   Intervention Provide advice, education, support and counseling about physical activity/exercise needs.;Develop an individualized exercise prescription for aerobic and resistive training based on initial evaluation findings, risk stratification, comorbidities and participant's personal goals.   Expected Outcomes Achievement of increased cardiorespiratory fitness and enhanced flexibility, muscular endurance and strength shown through measurements of functional capacity and personal statement of participant.    Hypertension Yes   Intervention Provide education on lifestyle modifcations including regular physical activity/exercise, weight management, moderate sodium restriction and increased consumption of fresh fruit, vegetables, and low fat dairy, alcohol moderation, and smoking cessation.;Monitor prescription use compliance.   Expected Outcomes Short Term: Continued assessment and intervention until BP is < 140/12mm HG in hypertensive participants. < 130/14mm HG in hypertensive participants with diabetes, heart failure or chronic kidney disease.;Long Term: Maintenance of blood pressure at goal levels.   Lipids Yes   Intervention Provide education and support for participant on nutrition & aerobic/resistive exercise along with prescribed medications to achieve LDL 70mg , HDL >40mg .   Expected Outcomes Short Term: Participant states understanding of desired cholesterol values and is compliant with medications prescribed. Participant is following exercise prescription and nutrition guidelines.;Long Term: Cholesterol controlled with medications as prescribed, with individualized exercise RX and with personalized nutrition plan. Value goals: LDL < 70mg , HDL > 40 mg.   Personal Goal Other Yes   Personal Goal Get heart stronger, so he can have diaphragm suregery.   Intervention Develop aerobic and resistance training program to increase functional capacity and cardiorespiratory fitness.    Expected Outcomes Improve functional capacity as measured by 6 minute walk test. Have diaphragm surgery once cleared by physician.      Core Components/Risk Factors/Patient Goals Review:      Goals and Risk Factor Review    Row Name 02/25/16 1418 04/04/16 1107           Core Components/Risk Factors/Patient Goals Review   Personal Goals Review Increase Strength and Stamina;Improve shortness of breath with ADL's Increase Strength and Stamina;Improve shortness of breath with ADL's      Review pt would absent from [facility] for 1  week due to illness. He  has returned and is slowly gaining momentum again.  Will continue to follow and increase workloads as tolerated.  Pt should begin HEP immediately Pt states he doesn't feel like his breathing is improving much and he isn't exercising much at home.  I encouraged him to start exercising at home regularly to improve his breathing with exertion      Expected Outcomes Begin HEP and have good attendance in order to increase strengh, stamina and improve overall cardiovascular fitness  If consistent with HEP and attendance to CRPII pt will increase breathing, strength, stamina and overall cardiovascular fitness          Core Components/Risk Factors/Patient Goals at Discharge (Final Review):      Goals and Risk Factor Review - 04/04/16 1107      Core Components/Risk Factors/Patient Goals Review   Personal Goals Review Increase Strength and Stamina;Improve shortness of breath with ADL's   Review Pt states he doesn't feel like his breathing is improving much and he isn't exercising much at home.  I encouraged him to start exercising at home regularly to improve his breathing with exertion   Expected Outcomes If consistent with HEP and attendance to CRPII pt will increase breathing, strength, stamina and overall cardiovascular fitness       ITP Comments:     ITP Comments    Row Name 01/26/16 1333           ITP Comments Medical Director- Dr. Fransico Him, MD.          Comments:  Pt is making expected progress toward personal goals after completing 20 sessions. Pt progress is slow due to the chronic nature of his disease process.  Pt recent labwork showed increase in BNP. Psychosocial Assessment  Pt with good support at home. Pt interacts positively with staff and participants.  Recommend continued exercise and life style modification education including  stress management and relaxation techniques to decrease cardiac risk profile. Cherre Huger, BSN

## 2016-04-07 NOTE — Progress Notes (Signed)
EPIC Encounter for ICM Monitoring  Patient Name: [name] is a 56 y.o. male Date: 04/07/2016 Primary Care Physican: Donetta Potts, MD Primary Cardiologist:[facility] [place] Electrophysiologist: Caryl Comes Dry Weight:236 lbs Bi-V Pacing: 98.6%      Patient sent remote transmission on 04/06/2016 for review but did not call to inform he was sending it.  He stated his feet are swelling some and he has gained 1-2 pounds in the last few days  Thoracic impedance abnormal suggesting fluid accumulation since 03/17/2016.   He stated his Torsemide has been decreased by his [facility] nephrologist is adjusting his Torsemide and it was decreased to 10 mg on Saturday, Tuesday and Thursday because of the labs were abnormal.    Labs:  03/30/2016 Creatinine 1.64, BUN 46, Potassium 5.3, Sodium 136, BNP 408- Care every where results  03/24/2016 Creatinine 1.53, BUN 33, Potassium 5.0, Sodium 138 - Care everywhere results 03/15/2016 Creatinine 1.76, BUN 51, Potassium 5.7, Sodium 137 - Care everywhere results 02/01/2016 Creatinine 1.67, BUN 27, Potassium 4.9, Sodium 138, EGFR 43 - Care Everywhere results 06/26/2015 Creatinine 1.55, BUN 27, Potassium 5.3, Sodium 136, EGFR 49-57 03/10/2015 Creatinine 1.71, BUN 21, Potassium 4.2, Sodium 139   Recommendations:  He asked if he should adjust his Torsemide. Advised since the [facility] physicians have the most recent lab results and they are adjusting the Torsemide he would need to contact them tomorrow.  He denied any urgent symptoms.  He said he would contact them tomorrow.    Follow-up plan: ICM clinic phone appointment on 04/12/2016 to recheck fluid levels.  Copy of ICM check sent to primary cardiologist and device physician.   3 month ICM trend: 04/06/2016   1 Year ICM trend:      Rosalene Billings, RN 04/07/2016 4:43 PM

## 2016-04-08 ENCOUNTER — Encounter (HOSPITAL_COMMUNITY): Payer: 59

## 2016-04-11 ENCOUNTER — Encounter (HOSPITAL_COMMUNITY)
Admission: RE | Admit: 2016-04-11 | Discharge: 2016-04-11 | Disposition: A | Discharge status: Home / Self Care | Payer: 59 | Source: Ambulatory Visit | Attending: Cardiology | Admitting: Cardiology

## 2016-04-11 ENCOUNTER — Telehealth (HOSPITAL_COMMUNITY): Payer: Self-pay | Admitting: *Deleted

## 2016-04-11 DIAGNOSIS — I5022 Chronic systolic (congestive) heart failure: Secondary | ICD-10-CM | POA: Insufficient documentation

## 2016-04-11 NOTE — Telephone Encounter (Signed)
Pt absent for a week in cardiac rehab.  Called for general well being. Pt with medication changes as a result of elevated BNP.  Pt did not feel up to exercise. Pt hopes to return to exercise on Friday. Cherre Huger, BSN

## 2016-04-12 ENCOUNTER — Ambulatory Visit (INDEPENDENT_AMBULATORY_CARE_PROVIDER_SITE_OTHER): Payer: 59

## 2016-04-12 DIAGNOSIS — I5042 Chronic combined systolic (congestive) and diastolic (congestive) heart failure: Secondary | ICD-10-CM | POA: Diagnosis not present

## 2016-04-12 DIAGNOSIS — Z95810 Presence of automatic (implantable) cardiac defibrillator: Secondary | ICD-10-CM

## 2016-04-12 NOTE — Progress Notes (Signed)
EPIC Encounter for ICM Monitoring  Patient Name: [name] is a 56 y.o. male Date: 04/12/2016 Primary Care Physican: Donetta Potts, MD Primary [place] Electrophysiologist: Caryl Comes Dry Weight:232lbs Bi-V Pacing: 96%                                                Patient reported symptoms of swelling in feet and weight gain have resolved.  He called [facility] Cardiologist and was advised to increase Torsemide x 4 days.   Thoracic impedance returned to normal today after 4 days of increased Torsemide.  Labs:  03/30/2016 Creatinine 1.64, BUN 46, Potassium 5.3, Sodium 136, BNP 408- Care every where results  03/24/2016 Creatinine 1.53, BUN 33, Potassium 5.0, Sodium 138 - Care everywhere results 03/15/2016 Creatinine 1.76, BUN 51, Potassium 5.7, Sodium 137 - Care everywhere results 02/01/2016 Creatinine 1.67, BUN 27, Potassium 4.9, Sodium 138, EGFR 43 - Care Everywhere results 04/21/2017Creatinine 1.55, BUN 27, Potassium 5.3, Sodium 136, EGFR 49-57 03/10/2015 Creatinine 1.71, BUN 21, Potassium 4.2, Sodium 139   Recommendations: No changes.  Patient admits to eating some foods high in salt over the weekend. Encouraged to call for fluid symptoms.  Follow-up plan: ICM clinic phone appointment on 06/07/2016.  Office appointment with Dr Caryl Comes 05/05/2016  Copy of ICM check sent to device physician.   3 month ICM trend: 04/12/2016   1 Year ICM trend:      Rosalene Billings, RN 04/12/2016 5:24 PM

## 2016-04-13 ENCOUNTER — Encounter (HOSPITAL_COMMUNITY): Admission: RE | Admit: 2016-04-13 | Payer: 59 | Source: Ambulatory Visit

## 2016-04-15 ENCOUNTER — Encounter (HOSPITAL_COMMUNITY): Payer: 59

## 2016-04-18 ENCOUNTER — Encounter (HOSPITAL_COMMUNITY): Payer: 59

## 2016-04-20 ENCOUNTER — Encounter (HOSPITAL_COMMUNITY): Payer: 59

## 2016-04-22 ENCOUNTER — Encounter (HOSPITAL_COMMUNITY): Payer: 59

## 2016-04-25 ENCOUNTER — Encounter: Payer: Self-pay | Admitting: Internal Medicine

## 2016-04-25 ENCOUNTER — Encounter (HOSPITAL_COMMUNITY): Payer: 59

## 2016-04-27 ENCOUNTER — Encounter (HOSPITAL_COMMUNITY): Admission: RE | Admit: 2016-04-27 | Payer: 59 | Source: Ambulatory Visit

## 2016-04-28 ENCOUNTER — Telehealth: Payer: Self-pay

## 2016-04-28 ENCOUNTER — Telehealth (HOSPITAL_COMMUNITY): Payer: Self-pay | Admitting: *Deleted

## 2016-04-28 NOTE — Telephone Encounter (Signed)
Patient left voice mail message on 04/27/2016 requesting a return call today.  Spoke with patient today.  He said he thinks he passed out yesterday at home and wanted to know what he should do.  He stated he feels fine today.  Denied any dizziness, lightheadedness, or further episodes of passing out.  He has been taking extra fluid pills for the last few days because he had a 3-4 pound weight gain in last week and feet were very swollen after eating at [employer].  Advised restaurant foods are high in salt.  He is working today and will send a transmission after 6 pm tonight.  Weight has dropped 3 lbs and today weights 234 lbs and swelling of feet has almost resolved.  Advised if he has another episode of passing out to call the MD office directly and not wait for me to return to the office.  He understood I was out of the office when he called since it is on the ICM voice mail message of what days and hours I am in the office. Advised that per [facility] law he can not drive for 6 months from date of syncopal episode.  He stated he understands.

## 2016-04-28 NOTE — Telephone Encounter (Signed)
Pt continued absence beyond expected return date.  Pt called.  Pt indicated that he was continuing to have issues with fluid in his lower extremities. Pt wearing compression stockings and believes his feet and ankles are slightly better. Pt unsure he will return to rehab.  Pt will see his EP doctor next week.  Pt is submit a transmission from his device due to passing out.  Believed to be dehydration related due to increase diuretic usage. Cherre Huger, BSN

## 2016-04-29 ENCOUNTER — Telehealth: Payer: Self-pay

## 2016-04-29 ENCOUNTER — Ambulatory Visit (INDEPENDENT_AMBULATORY_CARE_PROVIDER_SITE_OTHER): Payer: 59

## 2016-04-29 ENCOUNTER — Encounter (HOSPITAL_COMMUNITY): Admission: RE | Admit: 2016-04-29 | Payer: 59 | Source: Ambulatory Visit

## 2016-04-29 DIAGNOSIS — I5042 Chronic combined systolic (congestive) and diastolic (congestive) heart failure: Secondary | ICD-10-CM

## 2016-04-29 DIAGNOSIS — Z95810 Presence of automatic (implantable) cardiac defibrillator: Secondary | ICD-10-CM

## 2016-04-29 NOTE — Progress Notes (Signed)
EPIC Encounter for ICM Monitoring  Patient Name: [name] is a 56 y.o. male Date: 04/29/2016 Primary Care Physican: Donetta Potts, MD Primary [place] Electrophysiologist: Caryl Comes Dry Weight:232lbs Bi-V Pacing: 96.4%      Heart Failure questions reviewed, pt asymptomatic today.  Today he stated he is feeling fine.      Patient reported on 04/28/2016 that he passed out at home on 04/27/2016 but unsure for how long.  He denied any dizziness or other symptoms prior to passing out or at the time of phone call on 04/28/2016. He reported feeling fine at time of call (see phone note 2/22).  He was unable to send a remote transmission until after work on 2/22.   Thoracic impedance trending slightly below baseline, abnormal suggesting fluid accumulation .   No alerts for this transmission.  Prescribed dosage Torsemide 10 mg 1 tablet daily.  Labs:  04/08/2016 Creatinine 1.89, BUN 54, Potassium 5.5, Sodium 135, EGFR 37-45 Care every where results  03/30/2016 Creatinine 1.64, BUN 46, Potassium 5.3, Sodium 136, BNP 408- Care every where results  03/24/2016 Creatinine 1.53, BUN 33, Potassium 5.0, Sodium 138 - Care everywhere results 03/15/2016 Creatinine 1.76, BUN 51, Potassium 5.7, Sodium 137 - Care everywhere results 02/01/2016 Creatinine 1.67, BUN 27, Potassium 4.9, Sodium 138, EGFR 43 - Care Everywhere results 04/21/2017Creatinine 1.55, BUN 27, Potassium 5.3, Sodium 136, EGFR 49-57 03/10/2015 Creatinine 1.71, BUN 21, Potassium 4.2, Sodium 139   Recommendations: No changes. He was told by MD not to eat bananas since his last potassium was high and he wanted a review on low potassium fruits.  Reviewed list of low potassium fruits but advised him to research more about the list to determine who much potassium is in each one.  Encouraged to call for fluid symptoms.  Follow-up plan: ICM clinic phone appointment on 06/07/2016.  Office  appointment with Dr Caryl Comes 05/05/2016  Copy of ICM check sent to Dr Caryl Comes to review transmission and the recent episode of him passing out.   If any recommendations, will call him back.   3 month ICM trend: 04/29/2016   1 Year ICM trend:      Rosalene Billings, RN 04/29/2016 8:20 AM

## 2016-04-29 NOTE — Telephone Encounter (Signed)
Remote ICM transmission received.  Attempted patient call and left message to return call.   

## 2016-05-02 ENCOUNTER — Encounter (HOSPITAL_COMMUNITY): Payer: 59

## 2016-05-04 ENCOUNTER — Encounter (HOSPITAL_COMMUNITY): Payer: Self-pay | Admitting: *Deleted

## 2016-05-04 ENCOUNTER — Ambulatory Visit: Payer: 59 | Admitting: Cardiothoracic Surgery

## 2016-05-04 ENCOUNTER — Encounter (HOSPITAL_COMMUNITY): Payer: 59

## 2016-05-04 ENCOUNTER — Other Ambulatory Visit: Payer: 59

## 2016-05-04 NOTE — Progress Notes (Signed)
Cardiac Individual Treatment Plan  Patient Details  Name: [name] MRN: [id number] Date of Birth: 04-May-1960 Referring Provider:   Flowsheet Row CARDIAC REHAB PHASE II ORIENTATION from 01/26/2016 in [place]  Referring Provider  Fernande Bras, MD      Initial Encounter Date:  [place] PHASE II ORIENTATION from 01/26/2016 in [place]  Date  01/26/16  Referring Provider  Fernande Bras, MD      Visit Diagnosis: No diagnosis found.  Patient's Home Medications on Admission:  Current Outpatient Prescriptions:  .  atorvastatin (LIPITOR) 40 MG tablet, TAKE 1 TABLET BY MOUTH AT BEDTIME, Disp: 30 tablet, Rfl: 2 .  BREO ELLIPTA 100-25 MCG/INH AEPB, Inhale 1 puff into the lungs daily., Disp: , Rfl: 11 .  carvedilol (COREG) 6.25 MG tablet, Take 1 tablet (6.25 mg total) by mouth 2 (two) times daily. (Patient taking differently: Take 9.375 mg by mouth 2 (two) times daily. ), Disp: 60 tablet, Rfl: 11 .  digoxin (LANOXIN) 0.25 MG tablet, TAKE 1/2 TAB (0.125 MG TOTAL) BY MOUTH DAILY., Disp: 15 tablet, Rfl: 3 .  Febuxostat (ULORIC) 80 MG TABS, Take 80 mg by mouth at bedtime., Disp: , Rfl:  .  gabapentin (NEURONTIN) 300 MG capsule, Take 1 capsule by mouth 3 (three) times daily. Pain, Disp: , Rfl:  .  hydrALAZINE (APRESOLINE) 100 MG tablet, Take 100 mg by mouth 2 (two) times daily., Disp: , Rfl:  .  magnesium oxide (MAG-OX) 400 MG tablet, Take two tablets (800 mg) by mouth twice daily, Disp: 120 tablet, Rfl: 11 .  MITIGARE 0.6 MG CAPS, Take 0.6 mg by mouth daily., Disp: 30 capsule, Rfl: 6 .  tobramycin-dexamethasone (TOBRADEX) ophthalmic solution, Place 2 drops into the right eye 2 (two) times daily., Disp: , Rfl: 5 .  torsemide (DEMADEX) 10 MG tablet, Take 10 mg by mouth daily. Taking 10 mg every other day., Disp: , Rfl:   Past Medical History: Past Medical History:  Diagnosis Date  . Arthritis    GOUT   . Automatic implantable cardioverter-defibrillator in situ   . Biventricular ICD (implantable cardiac defibrillator) Medtronic ]    DOI 2008/ upgrade 2010/ Gen Change 2014  . Bladder tumor    PT [facility]OSP AT [facility] 4/24 TO 4/26 2014 WITH UTI--AND FOUND TO HAVE BLADDER TUMOR-  . Cardiomyopathy    Idiopathic dilated;   . CHF (congestive heart failure) ([place])   . CKD (chronic kidney disease)    stage III baseline Crt 1.8-2.0  . Coronary artery disease   . Gout 08/03/12   PT C/O OF RIGHT KNEE PAIN AND SWELLING -STATES GOUT FLARE UP - ONGOING SINCE APRIL - BUT SWELLING W/IN LAST WEEK  . Hilar mass    Noted CT 2010  . HTN (hypertension)    Dr. Caryl Comes cardiologist  . Hypothyroidism   . 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 Sleep apnea    CPAP  . Systolic CHF ([place])    EF 0000000  . Ventricular tachycardia ([place])    s/p ICD    Tobacco Use: History  Smoking Status  . Never Smoker  Smokeless Tobacco  . Never Used    Labs: Recent Review Flowsheet Data    Labs for ITP Cardiac and Pulmonary Rehab Latest Ref Rng & Units 02/14/2014 02/14/2014 02/15/2014 02/18/2014 02/21/2014   Hemoglobin A1c <5.7 % - - - - -  PHART 7.350 - 7.450 - - - - -   PCO2ART 35.0 - 45.0 mmHg - - - - -   HCO3 20.0 - 24.0 mEq/L - - - - -   TCO2 0 - 100 mmol/L 17 19 - - -   ACIDBASEDEF 0.0 - 2.0 mmol/L - - - - -   O2SAT % - - 64.6 58.2 58.1      Capillary Blood Glucose: Lab Results  Component Value Date   GLUCAP 98 02/22/2014   GLUCAP 125 (H) 02/22/2014   GLUCAP 147 (H) 02/21/2014   GLUCAP 136 (H) 02/21/2014   GLUCAP 130 (H) 02/21/2014     Exercise Target Goals:    Exercise Program Goal: Individual exercise prescription set with THRR, safety & activity barriers. Participant demonstrates ability to understand and report RPE using BORG scale, to self-measure pulse accurately, and to acknowledge the importance of the exercise prescription.  Exercise Prescription  Goal: Starting with aerobic activity 30 plus minutes a day, 3 days per week for initial exercise prescription. Provide home exercise prescription and guidelines that participant acknowledges understanding prior to discharge.  Activity Barriers & Risk Stratification:   6 Minute Walk:   Oxygen Initial Assessment:   Oxygen Re-Evaluation:   Oxygen Discharge (Final Oxygen Re-Evaluation):   Initial Exercise Prescription:   Perform Capillary Blood Glucose checks as needed.  Exercise Prescription Changes:     Exercise Prescription Changes    Row Name 04/04/16 1100             Response to Exercise   Blood Pressure (Admit) 134/84       Blood Pressure (Exercise) 122/84       Blood Pressure (Exit) 118/80       Heart Rate (Admit) 99 bpm       Heart Rate (Exercise) 107 bpm       Heart Rate (Exit) 92 bpm       Rating of Perceived Exertion (Exercise) 12       Duration Progress to 45 minutes of aerobic exercise without signs/symptoms of physical distress       Intensity THRR unchanged         Progression   Progression Continue to progress workloads to maintain intensity without signs/symptoms of physical distress.       Average METs 2.2         Resistance Training   Training Prescription Yes       Weight 2lbs       Reps 10-12         NuStep   Level 3       Minutes 20       METs 2.4         Arm Ergometer   Level 1       Watts 35       Minutes 10       METs 2.66         Home Exercise Plan   Plans to continue exercise at Home       Frequency Add 2 additional days to program exercise sessions.          Exercise Comments:     Exercise Comments    Row Name 04/04/16 1106           Exercise Comments Reviewed METs and goals with pt.  Pt is doing well with exercise           Exercise Goals and Review:     Exercise Goals  [place] Name 05/04/16 1648             Exercise Goals   Increase Physical Activity Yes       Intervention Provide advice, education,  support and counseling about physical activity/exercise needs.;Develop an individualized exercise prescription for aerobic and resistive training based on initial evaluation findings, risk stratification, comorbidities and participant's personal goals.       Expected Outcomes Achievement of increased cardiorespiratory fitness and enhanced flexibility, muscular endurance and strength shown through measurements of functional capacity and personal statement of participant.       Increase Strength and Stamina Yes       Intervention Provide advice, education, support and counseling about physical activity/exercise needs.;Develop an individualized exercise prescription for aerobic and resistive training based on initial evaluation findings, risk stratification, comorbidities and participant's personal goals.       Expected Outcomes Achievement of increased cardiorespiratory fitness and enhanced flexibility, muscular endurance and strength shown through measurements of functional capacity and personal statement of participant.          Exercise Goals Re-Evaluation :     Exercise Goals Re-Evaluation    Row Name 05/04/16 1648             Exercise Goal Re-Evaluation   Exercise Goals Review Increase Physical Activity;Increase Strenth and Stamina       Comments Pt has been absent for several weeks due to illness.  Hopes to return to [facility] ASAP       Expected Outcomes Needs to continue with exercise prescription as soon as possible in order to continue to gain cardiovascular benefits and increase ADL functional ability            Discharge Exercise Prescription (Final Exercise Prescription Changes):     Exercise Prescription Changes - 04/04/16 1100      Response to Exercise   Blood Pressure (Admit) 134/84   Blood Pressure (Exercise) 122/84   Blood Pressure (Exit) 118/80   Heart Rate (Admit) 99 bpm   Heart Rate (Exercise) 107 bpm   Heart Rate (Exit) 92 bpm   Rating of Perceived Exertion  (Exercise) 12   Duration Progress to 45 minutes of aerobic exercise without signs/symptoms of physical distress   Intensity THRR unchanged     Progression   Progression Continue to progress workloads to maintain intensity without signs/symptoms of physical distress.   Average METs 2.2     Resistance Training   Training Prescription Yes   Weight 2lbs   Reps 10-12     NuStep   Level 3   Minutes 20   METs 2.4     Arm Ergometer   Level 1   Watts 35   Minutes 10   METs 2.66     Home Exercise Plan   Plans to continue exercise at Home   Frequency Add 2 additional days to program exercise sessions.      Nutrition:  Target Goals: Understanding of nutrition guidelines, daily intake of sodium 1500mg , cholesterol 200mg , calories 30% from fat and 7% or less from saturated fats, daily to have 5 or more servings of fruits and vegetables.  Biometrics:    Nutrition Therapy Plan and Nutrition Goals:   Nutrition Discharge: Nutrition Scores:   Nutrition Goals Re-Evaluation:   Nutrition Goals Re-Evaluation:   Nutrition Goals Discharge (Final Nutrition Goals Re-Evaluation):   Psychosocial: Target Goals: Acknowledge presence or absence of significant depression and/or stress, maximize coping skills, provide positive support system. Participant is able to verbalize types  and ability to use techniques and skills needed for reducing stress and depression.  Initial Review & Psychosocial Screening:   Quality of Life Scores:   PHQ-9: Recent Review Flowsheet Data    Depression screen [facility] 2/9 02/01/2016 01/31/2015 12/14/2014 10/16/2014 09/19/2014   Decreased Interest 1 0 0 0 0   Down, Depressed, Hopeless 1 0 0 0 0   PHQ - 2 Score 2 0 0 0 0   Altered sleeping 1 - - - -   Tired, decreased energy 1 - - - -   Change in appetite 0 - - - -   Feeling bad or failure about yourself  1 - - - -   Trouble concentrating 1 - - - -   Moving slowly or fidgety/restless 0 - - - -   Suicidal  thoughts 0 - - - -   PHQ-9 Score 6 - - - -     Interpretation of Total Score  Total Score Depression Severity:  1-4 = Minimal depression, 5-9 = Mild depression, 10-14 = Moderate depression, 15-19 = Moderately severe depression, 20-27 = Severe depression   Psychosocial Evaluation and Intervention:     Psychosocial Evaluation - 05/04/16 1730      Psychosocial Evaluation & Interventions   Interventions Encouraged to exercise with the program and follow exercise prescription   Expected Outcomes When pt is able to attend, pt tries his very best.  Pt encourages other patients.      Psychosocial Re-Evaluation:     Psychosocial Re-Evaluation    Row Name 03/10/16 1306 04/07/16 1450 05/04/16 1729         Psychosocial Re-Evaluation   Current issues with  -  - Current Stress Concerns;Current Depression     Comments supportive family, making slow progress toward goals. supportive family, making slow progress toward goals. Pt absent from exercise due to worsening health condition unsure if will return     Interventions Encouraged to attend Cardiac Rehabilitation for the exercise Encouraged to attend Cardiac Rehabilitation for the exercise Encouraged to attend Cardiac Rehabilitation for the exercise     Continue Psychosocial Services  No No No Follow up required        Psychosocial Discharge (Final Psychosocial Re-Evaluation):     Psychosocial Re-Evaluation - 05/04/16 1729      Psychosocial Re-Evaluation   Current issues with Current Stress Concerns;Current Depression   Comments Pt absent from exercise due to worsening health condition unsure if will return   Interventions Encouraged to attend Cardiac Rehabilitation for the exercise   Continue Psychosocial Services  No Follow up required      Vocational Rehabilitation: Provide vocational rehab assistance to qualifying candidates.   Vocational Rehab Evaluation & Intervention:   Education: Education Goals: Education classes will  be provided on a weekly basis, covering required topics. Participant will state understanding/return demonstration of topics presented.  Learning Barriers/Preferences:   Education Topics: Count Your Pulse:  -Group instruction provided by verbal instruction, demonstration, patient participation and written materials to support subject.  Instructors address importance of being able to find your pulse and how to count your pulse when at home without a heart monitor.  Patients get hands on experience counting their pulse with staff help and individually.   Heart Attack, Angina, and Risk Factor Modification:  -Group instruction provided by verbal instruction, video, and written materials to support subject.  Instructors address signs and symptoms of angina and heart attacks.    Also discuss risk factors for heart disease and  how to make changes to improve heart health risk factors.   Functional Fitness:  -Group instruction provided by verbal instruction, demonstration, patient participation, and written materials to support subject.  Instructors address safety measures for doing things around the house.  Discuss how to get up and down off the floor, how to pick things up properly, how to safely get out of a chair without assistance, and balance training.   Meditation and Mindfulness:  -Group instruction provided by verbal instruction, patient participation, and written materials to support subject.  Instructor addresses importance of mindfulness and meditation practice to help reduce stress and improve awareness.  Instructor also leads participants through a meditation exercise.    Stretching for Flexibility and Mobility:  -Group instruction provided by verbal instruction, patient participation, and written materials to support subject.  Instructors lead participants through series of stretches that are designed to increase flexibility thus improving mobility.  These stretches are additional  exercise for major muscle groups that are typically performed during regular warm up and cool down.   Hands Only CPR Anytime:  -Group instruction provided by verbal instruction, video, patient participation and written materials to support subject.  Instructors co-teach with AHA video for hands only CPR.  Participants get hands on experience with mannequins.   Nutrition I class: Heart Healthy Eating:  -Group instruction provided by PowerPoint slides, verbal discussion, and written materials to support subject matter. The instructor gives an explanation and review of the Therapeutic Lifestyle Changes diet recommendations, which includes a discussion on lipid goals, dietary fat, sodium, fiber, plant stanol/sterol esters, sugar, and the components of a well-balanced, healthy diet.   Nutrition II class: Lifestyle Skills:  -Group instruction provided by PowerPoint slides, verbal discussion, and written materials to support subject matter. The instructor gives an explanation and review of label reading, grocery shopping for heart health, heart healthy recipe modifications, and ways to make healthier choices when eating out.   Diabetes Question & Answer:  -Group instruction provided by PowerPoint slides, verbal discussion, and written materials to support subject matter. The instructor gives an explanation and review of diabetes co-morbidities, pre- and post-prandial blood glucose goals, pre-exercise blood glucose goals, signs, symptoms, and treatment of hypoglycemia and hyperglycemia, and foot care basics.   Diabetes Blitz:  -Group instruction provided by PowerPoint slides, verbal discussion, and written materials to support subject matter. The instructor gives an explanation and review of the physiology behind type 1 and type 2 diabetes, diabetes medications and rational behind using different medications, pre- and post-prandial blood glucose recommendations and Hemoglobin A1c goals, diabetes diet,  and exercise including blood glucose guidelines for exercising safely.    Portion Distortion:  -Group instruction provided by PowerPoint slides, verbal discussion, written materials, and food models to support subject matter. The instructor gives an explanation of serving size versus portion size, changes in portions sizes over the last 20 years, and what consists of a serving from each food group.   Stress Management:  -Group instruction provided by verbal instruction, video, and written materials to support subject matter.  Instructors review role of stress in heart disease and how to cope with stress positively.     Exercising on Your Own:  -Group instruction provided by verbal instruction, power point, and written materials to support subject.  Instructors discuss benefits of exercise, components of exercise, frequency and intensity of exercise, and end points for exercise.  Also discuss use of nitroglycerin and activating EMS.  Review options of places to exercise outside of rehab.  Review guidelines for sex with heart disease.   Cardiac Drugs I:  -Group instruction provided by verbal instruction and written materials to support subject.  Instructor reviews cardiac drug classes: antiplatelets, anticoagulants, beta blockers, and statins.  Instructor discusses reasons, side effects, and lifestyle considerations for each drug class.   Cardiac Drugs II:  -Group instruction provided by verbal instruction and written materials to support subject.  Instructor reviews cardiac drug classes: angiotensin converting enzyme inhibitors (ACE-I), angiotensin II receptor blockers (ARBs), nitrates, and calcium channel blockers.  Instructor discusses reasons, side effects, and lifestyle considerations for each drug class.   Anatomy and Physiology of the Circulatory System:  -Group instruction provided by verbal instruction, video, and written materials to support subject.  Reviews functional anatomy of  heart, how it relates to various diagnoses, and what role the heart plays in the overall system. Flowsheet Row CARDIAC REHAB PHASE II EXERCISE from 02/24/2016 in [place]  Date  02/24/16  Instruction Review Code  2- meets goals/outcomes      Knowledge Questionnaire Score:   Core Components/Risk Factors/Patient Goals at Admission:   Core Components/Risk Factors/Patient Goals Review:      Goals and Risk Factor Review    Row Name 04/04/16 1107             Core Components/Risk Factors/Patient Goals Review   Personal Goals Review Increase Strength and Stamina;Improve shortness of breath with ADL's       Review Pt states he doesn't feel like his breathing is improving much and he isn't exercising much at home.  I encouraged him to start exercising at home regularly to improve his breathing with exertion       Expected Outcomes If consistent with HEP and attendance to CRPII pt will increase breathing, strength, stamina and overall cardiovascular fitness           Core Components/Risk Factors/Patient Goals at Discharge (Final Review):      Goals and Risk Factor Review - 04/04/16 1107      Core Components/Risk Factors/Patient Goals Review   Personal Goals Review Increase Strength and Stamina;Improve shortness of breath with ADL's   Review Pt states he doesn't feel like his breathing is improving much and he isn't exercising much at home.  I encouraged him to start exercising at home regularly to improve his breathing with exertion   Expected Outcomes If consistent with HEP and attendance to CRPII pt will increase breathing, strength, stamina and overall cardiovascular fitness       ITP Comments:   Comments:  Pt is making expected progress toward personal goals after completing 20 sessions. Pt last exercised 1/26. Pt progress is slow due to the chronic nature of his disease process.  Pt recent labwork showed increase in BNP. Pt has upcoming appt  with EP.  Pt to decide if he plans to return to cardiac rehab. Psychosocial Assessment  Pt with good support at home.When  Pt is able to attend he  interacts positively with staff and participants.  Recommend continued exercise and life style modification education including  stress management and relaxation techniques to decrease cardiac risk profile. Cherre Huger, BSN

## 2016-05-05 ENCOUNTER — Ambulatory Visit (INDEPENDENT_AMBULATORY_CARE_PROVIDER_SITE_OTHER): Payer: 59 | Admitting: Internal Medicine

## 2016-05-05 ENCOUNTER — Encounter: Payer: Self-pay | Admitting: Internal Medicine

## 2016-05-05 VITALS — BP 100/76 | HR 95 | Ht 70.0 in | Wt 242.0 lb

## 2016-05-05 DIAGNOSIS — I428 Other cardiomyopathies: Secondary | ICD-10-CM

## 2016-05-05 DIAGNOSIS — I5022 Chronic systolic (congestive) heart failure: Secondary | ICD-10-CM | POA: Diagnosis not present

## 2016-05-05 DIAGNOSIS — Z95810 Presence of automatic (implantable) cardiac defibrillator: Secondary | ICD-10-CM

## 2016-05-05 DIAGNOSIS — I472 Ventricular tachycardia: Secondary | ICD-10-CM

## 2016-05-05 DIAGNOSIS — I4729 Other ventricular tachycardia: Secondary | ICD-10-CM

## 2016-05-05 LAB — CUP PACEART INCLINIC DEVICE CHECK
Brady Statistic AP VP Percent: 0.05 %
Brady Statistic AP VS Percent: 0.01 %
Brady Statistic AS VP Percent: 96.34 %
Brady Statistic AS VS Percent: 3.6 %
HIGH POWER IMPEDANCE MEASURED VALUE: 43 Ohm
HIGH POWER IMPEDANCE MEASURED VALUE: 52 Ohm
Implantable Lead Implant Date: 20100205
Implantable Lead Location: 753858
Implantable Lead Location: 753859
Implantable Lead Location: 753860
Implantable Lead Model: 5076
Lead Channel Impedance Value: 285 Ohm
Lead Channel Impedance Value: 342 Ohm
Lead Channel Impedance Value: 418 Ohm
Lead Channel Impedance Value: 456 Ohm
Lead Channel Pacing Threshold Amplitude: 0.875 V
Lead Channel Pacing Threshold Pulse Width: 0.4 ms
Lead Channel Pacing Threshold Pulse Width: 0.4 ms
Lead Channel Sensing Intrinsic Amplitude: 16 mV
Lead Channel Sensing Intrinsic Amplitude: 17.125 mV
Lead Channel Setting Pacing Amplitude: 2 V
Lead Channel Setting Pacing Amplitude: 2 V
Lead Channel Setting Pacing Amplitude: 2.5 V
Lead Channel Setting Pacing Pulse Width: 0.4 ms
Lead Channel Setting Pacing Pulse Width: 0.4 ms
MDC IDC LEAD IMPLANT DT: 20080829
MDC IDC LEAD IMPLANT DT: 20100205
MDC IDC MSMT BATTERY REMAINING LONGEVITY: 37 mo
MDC IDC MSMT BATTERY VOLTAGE: 2.95 V
MDC IDC MSMT LEADCHNL LV IMPEDANCE VALUE: 589 Ohm
MDC IDC MSMT LEADCHNL RA IMPEDANCE VALUE: 456 Ohm
MDC IDC MSMT LEADCHNL RA PACING THRESHOLD AMPLITUDE: 0.375 V
MDC IDC MSMT LEADCHNL RA SENSING INTR AMPL: 2 mV
MDC IDC MSMT LEADCHNL RA SENSING INTR AMPL: 2.25 mV
MDC IDC MSMT LEADCHNL RV PACING THRESHOLD AMPLITUDE: 0.75 V
MDC IDC MSMT LEADCHNL RV PACING THRESHOLD PULSEWIDTH: 0.4 ms
MDC IDC PG IMPLANT DT: 20140226
MDC IDC SESS DTM: 20180301173125
MDC IDC SET LEADCHNL RV SENSING SENSITIVITY: 0.6 mV
MDC IDC STAT BRADY RA PERCENT PACED: 0.06 %
MDC IDC STAT BRADY RV PERCENT PACED: 94.48 %

## 2016-05-05 MED ORDER — HYDRALAZINE HCL 50 MG PO TABS: 50.0000 mg | ORAL_TABLET | Freq: Three times a day (TID) | ORAL | 3 refills | Status: DC

## 2016-05-05 MED ORDER — HYDRALAZINE HCL 50 MG PO TABS: 50.0000 mg | ORAL_TABLET | Freq: Two times a day (BID) | ORAL | 3 refills | Status: DC

## 2016-05-05 NOTE — Patient Instructions (Signed)
Medication Instructions:    Your physician has recommended you make the following change in your medication: 1) DECREASE Hydralazine to 50 mg twice daily 2) INCREASE Torsemide to 40 mg daily for 3 days, then decrease back down to normal dosing of 20 mg daily.  --- If you need a refill on your cardiac medications before your next appointment, please call your pharmacy. ---  Labwork:  None ordered  Testing/Procedures:  None ordered  Follow-Up: Remote monitoring is used to monitor your Pacemaker of ICD from home. This monitoring reduces the number of office visits required to check your device to one time per year. It allows [place] to keep an eye on the functioning of your device to ensure it is working properly. You are scheduled for a device check from home on 08/04/2016. You may send your transmission at any time that day. If you have a wireless device, the transmission will be sent automatically. After your physician reviews your transmission, you will receive a postcard with your next transmission date.   Your physician wants you to follow-up in: 1 year with Dr. Caryl Comes.  You will receive a reminder letter in the mail two months in advance. If you don't receive a letter, please call our office to schedule the follow-up appointment.  Thank you for choosing [facility]!!

## 2016-05-05 NOTE — Progress Notes (Signed)
Patient Care Team: Hulan Fess, MD as PCP - General (Family Medicine) Deboraha Sprang, MD as PCP - Cardiology (Cardiology) Collene Gobble, MD as Consulting Physician (Pulmonary Disease) Beryle Lathe, MD (General Surgery)   HPI  [name] is a 56 y.o. male is seen in followup for congestive heart failure in the setting of nonischemic cardiomyopathy. He is status post CRT-D. implantation. He had been previously treated for VT with amiodarone which was discontinued because of lightheadedness He was seen in jan 2103 with flurries of VT requiring therapy which I had hoped might be attributable to mechanical-electric issues aggravated by CHF   He was hospitalized again 11/13 for multiple inappropriate ICD shocks attributed to hypokalemia secondary acute renal insufficiency and T wave oversensing.  The device was reprogrammed to decrease sensitivity from 0.3-0.6 mV and the NID was increased.   He also has OSA on CPAP. He is now being followed by the [facility] heart failure clinic;  he was last seen 03/24/16. Those notes were reviewed. His edema was mild. He is put on significant edema and had increasing shortness of breath over the last couple of weeks. It is not responded to his current dose of diuretics. Labs were also reviewed from the outside.cR  1.77 or so at [facility]. More recently, day before yesterday,cR 1.63 at [facility].  Hemoglobin was normal BUN was 30.  His uric acid also quite concerningLY  was over 10. 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 Arthritis    GOUT  . Automatic implantable cardioverter-defibrillator in situ   . Biventricular ICD (implantable cardiac defibrillator) Medtronic ]    DOI 2008/ upgrade 2010/ Gen Change 2014  . Bladder tumor     PT HOSP AT [facility] 4/24 TO 4/26 2014 WITH UTI--AND FOUND TO HAVE BLADDER TUMOR-  . Cardiomyopathy    Idiopathic dilated;   . CHF (congestive heart failure) ([place])   . CKD (chronic kidney disease)    stage III baseline Crt 1.8-2.0  . Coronary artery disease   . Gout 08/03/12   PT C/O OF RIGHT KNEE PAIN AND SWELLING -STATES GOUT FLARE UP - ONGOING SINCE APRIL - BUT SWELLING W/IN LAST WEEK  . Hilar mass    Noted CT 2010  . HTN (hypertension)    Dr. Caryl Comes cardiologist  . Hypothyroidism   . 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 Sleep apnea    CPAP  . Systolic CHF ([place])    EF 0000000  . Ventricular tachycardia [facility])    s/p ICD    Past Surgical History:  Procedure Laterality Date  . BIV ICD GENERTAOR CHANGE OUT     DOI 2008/ upgrade 2010/ Gen Change 2014   . CARDIAC CATHETERIZATION     he was found to have normal coronary arteries but with a globally dilated and hypocontractile heart  . CARDIAC DEFIBRILLATOR PLACEMENT     DOI 2008/ upgrade 2010/ Gen Change 2014   . CHEST TUBE INSERTION  09/10/2013  . CHOLECYSTECTOMY    . cornea replacement    . ENDOBRONCHIAL ULTRASOUND Bilateral 10/01/2012   Procedure: ENDOBRONCHIAL ULTRASOUND;  Surgeon: Collene Gobble, MD;  Location: [facility] ENDOSCOPY;  Service: Cardiopulmonary;  Laterality: Bilateral;  . IMPLANTABLE  CARDIOVERTER DEFIBRILLATOR (ICD) GENERATOR CHANGE N/A 05/02/2012   Procedure: ICD GENERATOR CHANGE;  Surgeon: Deboraha Sprang, MD;  Location: [facility] CATH LAB;  Service: Cardiovascular;  Laterality: N/A;  . MEDIASTINOSCOPY N/A 10/25/2012   Procedure: MEDIASTINOSCOPY;  Surgeon: [name], MD;  Location: [place];  Service: Thoracic;  Laterality: N/A;  . MEDIASTINOTOMY  09/10/2013   paratracheal mass    DR Darcey Nora  . MEDIASTINOTOMY CHAMBERLAIN MCNEIL Right 09/10/2013   Procedure: MEDIASTINOTOMY CHAMBERLAIN MCNEIL;  Surgeon: [name], MD;  Location: [place];  Service: Thoracic;  Laterality: Right;   . RESECTION OF MEDIASTINAL MASS Right 02/13/2014   Procedure: RESECTION OF MEDIASTINAL MASS;  Surgeon: [name], MD;  Location: [facility] OR;  Service: Thoracic;  Laterality: Right;  PATIENT NEEDS EPIDURAL CATHETER AND A SWAN GANZ CATHETER (DR. CHRIS MOSER AWARE PER PVT)  . RIGHT HEART CATHETERIZATION N/A 01/27/2012   Procedure: RIGHT HEART CATH;  Surgeon: Jolaine Artist, MD;  Location: [facility] CATH LAB;  Service: Cardiovascular;  Laterality: N/A;  . TONSILLECTOMY    . TRANSURETHRAL RESECTION OF BLADDER TUMOR N/A 08/13/2012   Procedure: TRANSURETHRAL RESECTION OF BLADDER TUMOR (TURBT);  Surgeon: Claybon Jabs, MD;  Location: [facility] ORS;  Service: Urology;  Laterality: N/A;  MITOMYCIN C  . US ECHOCARDIOGRAPHY  03-17-2008, 07-03-2006   EF 15-20%, EF 15-20%  . VIDEO ASSISTED THORACOSCOPY (VATS)/THOROCOTOMY Right 02/13/2014   Procedure: VIDEO ASSISTED THORACOSCOPY (VATS)/THOROCOTOMY;  Surgeon: [name], MD;  Location: [facility] OR;  Service: Thoracic;  Laterality: Right;  PATIENT NEEDS EPIDURAL CATHETER (DR. CHRIS MOSER AWARE PER PVT)  . VIDEO BRONCHOSCOPY N/A 10/25/2012   Procedure: VIDEO BRONCHOSCOPY;  Surgeon: [name], MD;  Location: [facility] OR;  Service: Thoracic;  Laterality: N/A;    Current Outpatient Prescriptions  Medication Sig Dispense Refill  . atorvastatin (LIPITOR) 40 MG tablet TAKE 1 TABLET BY MOUTH AT BEDTIME 30 tablet 2  . BREO ELLIPTA 100-25 MCG/INH AEPB Inhale 1 puff into the lungs daily.  11  . carvedilol (COREG) 12.5 MG tablet Take 12.5 mg by mouth 2 (two) times daily with a meal.    . Febuxostat (ULORIC) 80 MG TABS Take 80 mg by mouth at bedtime.    . gabapentin (NEURONTIN) 300 MG capsule Take 1 capsule by mouth 3 (three) times daily. Pain    . hydrALAZINE (APRESOLINE) 100 MG tablet Take 100 mg by mouth 2 (two) times daily.    . Hydrocodone-Acetaminophen 10-300 MG TABS Take 1 tablet by mouth every 6 (six) hours as needed.    . magnesium oxide (MAG-OX) 400 MG tablet Take two  tablets (800 mg) by mouth twice daily 120 tablet 11  . MITIGARE 0.6 MG CAPS Take 0.6 mg by mouth daily. 30 capsule 6  . tobramycin-dexamethasone (TOBRADEX) ophthalmic solution Place 2 drops into the right eye 2 (two) times daily.  5  . torsemide (DEMADEX) 10 MG tablet Take 10 mg by mouth daily. Taking 10 mg every other day.     No current facility-administered medications for this visit.     Allergies  Allergen Reactions  . Bidil [Isosorb Dinitrate-Hydralazine]     Headaches   . Spironolactone     Hyperkalemia   . Hydralazine Hcl     Fuzzy headed    Review of Systems negative except from HPI and PMH  Physical Exam BP 100/76   Pulse 95   Ht 5\' 10"  (1.778 m)   Wt 242 lb (109.8 kg)   SpO2 99%  BMI 34.72 kg/m  Well developed and well nourished in no acute distress HENT normal E scleral and icterus clear Neck Supple JVP 8-10 cm; carotids brisk and full Clear to ausculation  Regular rate and rhythm, no murmurs gallops or rub Soft with active bowel sounds; fall  No clubbing cyanosis 2+ Edema Alert and oriented, grossly normal motor and sensory function Skin Warm and Dry   ECG P. synchronous biventricular pacing  With reprogramming we were able to make upright V1 and invert 1  Assessment and  Plan  Nonischemic cardiomyopathy   Ventricular tachycardia-paroxysmal  SVT   AVNRT and atrial tach  Congestive heart failure-acute/chronic-systolic   Implantable defibrillator-CRT-Medtronic The patient's device was interrogated.  The information was reviewed. No changes were made in the programming.     Renal insufficiency-grade 3   Diaphragmatic paralysis  Hypotension    He is significantly overloaded  Most recent Cr 1.63 so will push demadex  Will try 20-- if he diuresis will do for 3 days then revert to prior dosing.   If no response to 20 will have him do same with 40   Reassessment of EF here was never done and last done at [facility] ( CHF clinic) 8/17 ( reports and  cath)   With his blood pressure low, I will elect to decrease his hydralazine. I tried to get up with the heart failure team at [facility] but they had already gone home. I will touch base with her tomorrow.  Hopefully also improved resynchronization as evidenced by change in the ECG will improve cardiac performance  More than 50% of 45 min was spent in counseling related to the above

## 2016-05-06 ENCOUNTER — Encounter (HOSPITAL_COMMUNITY)
Admission: RE | Admit: 2016-05-06 | Discharge: 2016-05-06 | Disposition: A | Discharge status: Home / Self Care | Payer: 59 | Source: Ambulatory Visit | Attending: Cardiology | Admitting: Cardiology

## 2016-05-06 DIAGNOSIS — I5022 Chronic systolic (congestive) heart failure: Secondary | ICD-10-CM | POA: Insufficient documentation

## 2016-05-09 ENCOUNTER — Encounter (HOSPITAL_COMMUNITY): Payer: 59

## 2016-05-11 ENCOUNTER — Encounter (HOSPITAL_COMMUNITY): Payer: 59

## 2016-05-11 NOTE — Progress Notes (Signed)
Discharge Summary  Patient Details  Name: [name] MRN: [id number] Date of Birth: 1960-06-30 Referring Provider:   Flowsheet Row CARDIAC REHAB PHASE II ORIENTATION from 01/26/2016 in [place]  Referring Provider  Fernande Bras, MD       Number of Visits: 20   Reason for Discharge:  Pt dropped from program due to non attendance.  Pt frequent absences due to viral illness, cold weather and loss of interest.    Smoking History:  History  Smoking Status  . Never Smoker  Smokeless Tobacco  . Never Used    Diagnosis:  Heart failure, chronic systolic (HCC)  ADL UCSD:   Initial Exercise Prescription:     Initial Exercise Prescription - 01/26/16 1600      Date of Initial Exercise RX and Referring Provider   Date 01/26/16   Referring Provider Fernande Bras, MD     Bike   Level (P)  0.5   Minutes 10   METs 2.58     NuStep   Level 1   Minutes 10   METs 2     Arm Ergometer   Level 1   Watts 25   Minutes 10   METs 2.22     Prescription Details   Frequency (times per week) 3   Duration Progress to 30 minutes of continuous aerobic without signs/symptoms of physical distress     Intensity   THRR 40-80% of Max Heartrate 66-132   Ratings of Perceived Exertion 11-13   Perceived Dyspnea 0-4     Progression   Progression Continue to progress workloads to maintain intensity without signs/symptoms of physical distress.     Resistance Training   Training Prescription Yes   Weight 1   Reps 10-12      Discharge Exercise Prescription (Final Exercise Prescription Changes):     Exercise Prescription Changes - 04/04/16 1100      Response to Exercise   Blood Pressure (Admit) 134/84   Blood Pressure (Exercise) 122/84   Blood Pressure (Exit) 118/80   Heart Rate (Admit) 99 bpm   Heart Rate (Exercise) 107 bpm   Heart Rate (Exit) 92 bpm   Rating of Perceived Exertion (Exercise) 12   Duration Progress to 45 minutes of aerobic  exercise without signs/symptoms of physical distress   Intensity THRR unchanged     Progression   Progression Continue to progress workloads to maintain intensity without signs/symptoms of physical distress.   Average METs 2.2     Resistance Training   Training Prescription Yes   Weight 2lbs   Reps 10-12     NuStep   Level 3   Minutes 20   METs 2.4     Arm Ergometer   Level 1   Watts 35   Minutes 10   METs 2.66     Home Exercise Plan   Plans to continue exercise at Home   Frequency Add 2 additional days to program exercise sessions.      Functional Capacity:     6 Minute Walk    Row Name 01/26/16 1629         6 Minute Walk   Phase Initial     Distance 887 feet     Walk Time 6 minutes     # of Rest Breaks 0     MPH 1.68     METS 2.6     RPE 13     Perceived Dyspnea  3  VO2 Peak 9.09     Symptoms Yes (comment)     Comments DOE     Resting HR 90 bpm     Resting BP 118/78     Max Ex. HR 106 bpm     Max Ex. BP 128/80     2 Minute Post BP 116/78        Psychological, QOL, Others - Outcomes: PHQ 2/9: Depression screen [facility] 2/9 02/01/2016 01/31/2015 12/14/2014 10/16/2014 09/19/2014  Decreased Interest 1 0 0 0 0  Down, Depressed, Hopeless 1 0 0 0 0  PHQ - 2 Score 2 0 0 0 0  Altered sleeping 1 - - - -  Tired, decreased energy 1 - - - -  Change in appetite 0 - - - -  Feeling bad or failure about yourself  1 - - - -  Trouble concentrating 1 - - - -  Moving slowly or fidgety/restless 0 - - - -  Suicidal thoughts 0 - - - -  PHQ-9 Score 6 - - - -  Some recent data might be hidden    Quality of Life:     Quality of Life - 01/26/16 1624      Quality of Life Scores   Health/Function Pre 14.93 %   Socioeconomic Pre 22.07 %   Psych/Spiritual Pre 16.07 %   Family Pre 19.5 %   GLOBAL Pre 17.31 %      Personal Goals: Goals established at orientation with interventions provided to work toward goal.     Personal Goals and Risk Factors at Admission -  01/26/16 1634      Core Components/Risk Factors/Patient Goals on Admission    Weight Management Yes;Obesity   Intervention Weight Management: Develop a combined nutrition and exercise program designed to reach desired caloric intake, while maintaining appropriate intake of nutrient and fiber, sodium and fats, and appropriate energy expenditure required for the weight goal.;Weight Management: Provide education and appropriate resources to help participant work on and attain dietary goals.;Weight Management/Obesity: Establish reasonable short term and long term weight goals.;Obesity: Provide education and appropriate resources to help participant work on and attain dietary goals.   Admit Weight 239 lb 6.7 oz (108.6 kg)   Goal Weight: Short Term 233 lb (105.7 kg)   Goal Weight: Long Term 229 lb (103.9 kg)   Expected Outcomes Short Term: Continue to assess and modify interventions until short term weight is achieved;Long Term: Adherence to nutrition and physical activity/exercise program aimed toward attainment of established weight goal;Weight Loss: Understanding of general recommendations for a balanced deficit meal plan, which promotes 1-2 lb weight loss per week and includes a negative energy balance of [phone] kcal/d   Sedentary Yes   Intervention Provide advice, education, support and counseling about physical activity/exercise needs.;Develop an individualized exercise prescription for aerobic and resistive training based on initial evaluation findings, risk stratification, comorbidities and participant's personal goals.   Expected Outcomes Achievement of increased cardiorespiratory fitness and enhanced flexibility, muscular endurance and strength shown through measurements of functional capacity and personal statement of participant.   Hypertension Yes   Intervention Provide education on lifestyle modifcations including regular physical activity/exercise, weight management, moderate sodium  restriction and increased consumption of fresh fruit, vegetables, and low fat dairy, alcohol moderation, and smoking cessation.;Monitor prescription use compliance.   Expected Outcomes Short Term: Continued assessment and intervention until BP is < 140/76mm HG in hypertensive participants. < 130/74mm HG in hypertensive participants with diabetes, heart failure or chronic kidney disease.;Long Term:  Maintenance of blood pressure at goal levels.   Lipids Yes   Intervention Provide education and support for participant on nutrition & aerobic/resistive exercise along with prescribed medications to achieve LDL 70mg , HDL >40mg .   Expected Outcomes Short Term: Participant states understanding of desired cholesterol values and is compliant with medications prescribed. Participant is following exercise prescription and nutrition guidelines.;Long Term: Cholesterol controlled with medications as prescribed, with individualized exercise RX and with personalized nutrition plan. Value goals: LDL < 70mg , HDL > 40 mg.   Personal Goal Other Yes   Personal Goal Get heart stronger, so he can have diaphragm suregery.   Intervention Develop aerobic and resistance training program to increase functional capacity and cardiorespiratory fitness.    Expected Outcomes Improve functional capacity as measured by 6 minute walk test. Have diaphragm surgery once cleared by physician.       Personal Goals Discharge:     Goals and Risk Factor Review    Row Name 02/25/16 1418 04/04/16 1107           Core Components/Risk Factors/Patient Goals Review   Personal Goals Review Increase Strength and Stamina;Improve shortness of breath with ADL's Increase Strength and Stamina;Improve shortness of breath with ADL's      Review pt would absent from [facility] for 1 week due to illness. He has returned and is slowly gaining momentum again.  Will continue to follow and increase workloads as tolerated.  Pt should begin HEP immediately Pt states he  doesn't feel like his breathing is improving much and he isn't exercising much at home.  I encouraged him to start exercising at home regularly to improve his breathing with exertion      Expected Outcomes Begin HEP and have good attendance in order to increase strengh, stamina and improve overall cardiovascular fitness  If consistent with HEP and attendance to [facility] pt will increase breathing, strength, stamina and overall cardiovascular fitness          Nutrition & Weight - Outcomes:     Pre Biometrics - 01/26/16 1628      Pre Biometrics   Height 5\' 10"  (1.778 m)   Weight 239 lb 6.7 oz (108.6 kg)   Waist Circumference 46 inches   Hip Circumference 41 inches   Waist to Hip Ratio 1.12 %   BMI (Calculated) 34.4   Triceps Skinfold 27 mm   % Body Fat 34.6 %   Grip Strength 42 kg   Flexibility 0 in   Single Leg Stand 2.93 seconds       Nutrition:     Nutrition Therapy & Goals - 02/10/16 1053      Nutrition Therapy   Diet Low Sodium     Personal Nutrition Goals   Nutrition Goal 1-2 lb wt loss/week to a wt loss goal of 6-24 lb at graduation from [place], educate and counsel regarding individualized specific dietary modifications aiming towards targeted core components such as weight, hypertension, lipid management, diabetes, heart failure and other comorbidities.   Expected Outcomes Short Term Goal: Understand basic principles of dietary content, such as calories, fat, sodium, cholesterol and nutrients.;Long Term Goal: Adherence to prescribed nutrition plan.      Nutrition Discharge:     Nutrition Assessments - 02/10/16 1053      MEDFICTS Scores   Pre Score 39      Education Questionnaire Score:     Knowledge Questionnaire Score - 01/27/16 1636  Knowledge Questionnaire Score   Pre Score 17/24      Goals reviewed with patient; copy given to patient.

## 2016-05-11 NOTE — Addendum Note (Signed)
Encounter addended by: Lowell Guitar, RN on: 05/11/2016 12:17 PM<BR>    Actions taken: Sign clinical note

## 2016-05-13 ENCOUNTER — Encounter (HOSPITAL_COMMUNITY): Payer: 59

## 2016-05-16 ENCOUNTER — Encounter (HOSPITAL_COMMUNITY): Payer: 59

## 2016-05-16 NOTE — Addendum Note (Signed)
Encounter addended by: Lowell Guitar, RN on: 05/16/2016  4:11 PM<BR>    Actions taken: Sign clinical note

## 2016-05-16 NOTE — Progress Notes (Signed)
Discharge Summary  Patient Details  Name: [name] MRN: [id number] Date of Birth: [birth year] Referring Provider:   Flowsheet Row CARDIAC REHAB PHASE II ORIENTATION from 01/26/2016 in [facility]  Referring Provider  Fernande Bras, MD       Number of Visits: 20   Reason for Discharge:  Pt extended insurance allowed time period for rehab attendance.  Pt had frequent absences due to medical complications, pain, weather and fatigue.  Post assessments were not performed.   Smoking History:  History  Smoking Status  . Never Smoker  Smokeless Tobacco  . Never Used    Diagnosis:  Heart failure, chronic systolic (HCC)  ADL UCSD:   Initial Exercise Prescription:     Initial Exercise Prescription - 01/26/16 1600      Date of Initial Exercise RX and Referring Provider   Date 01/26/16   Referring Provider Fernande Bras, MD     Bike   Level (P)  0.5   Minutes 10   METs 2.58     NuStep   Level 1   Minutes 10   METs 2     Arm Ergometer   Level 1   Watts 25   Minutes 10   METs 2.22     Prescription Details   Frequency (times per week) 3   Duration Progress to 30 minutes of continuous aerobic without signs/symptoms of physical distress     Intensity   THRR 40-80% of Max Heartrate 66-132   Ratings of Perceived Exertion 11-13   Perceived Dyspnea 0-4     Progression   Progression Continue to progress workloads to maintain intensity without signs/symptoms of physical distress.     Resistance Training   Training Prescription Yes   Weight 1   Reps 10-12      Discharge Exercise Prescription (Final Exercise Prescription Changes):     Exercise Prescription Changes - 04/04/16 1100      Response to Exercise   Blood Pressure (Admit) 134/84   Blood Pressure (Exercise) 122/84   Blood Pressure (Exit) 118/80   Heart Rate (Admit) 99 bpm   Heart Rate (Exercise) 107 bpm   Heart Rate (Exit) 92 bpm   Rating of Perceived Exertion  (Exercise) 12   Duration Progress to 45 minutes of aerobic exercise without signs/symptoms of physical distress   Intensity THRR unchanged     Progression   Progression Continue to progress workloads to maintain intensity without signs/symptoms of physical distress.   Average METs 2.2     Resistance Training   Training Prescription Yes   Weight 2lbs   Reps 10-12     NuStep   Level 3   Minutes 20   METs 2.4     Arm Ergometer   Level 1   Watts 35   Minutes 10   METs 2.66     Home Exercise Plan   Plans to continue exercise at Home   Frequency Add 2 additional days to program exercise sessions.      Functional Capacity:     6 Minute Walk    Row Name 01/26/16 1629         6 Minute Walk   Phase Initial     Distance 887 feet     Walk Time 6 minutes     # of Rest Breaks 0     MPH 1.68     METS 2.6     RPE 13     Perceived Dyspnea  3     VO2 Peak 9.09     Symptoms Yes (comment)     Comments DOE     Resting HR 90 bpm     Resting BP 118/78     Max Ex. HR 106 bpm     Max Ex. BP 128/80     2 Minute Post BP 116/78        Psychological, QOL, Others - Outcomes: PHQ 2/9: Depression screen [facility] 2/9 02/01/2016 01/31/2015 12/14/2014 10/16/2014 09/19/2014  Decreased Interest 1 0 0 0 0  Down, Depressed, Hopeless 1 0 0 0 0  PHQ - 2 Score 2 0 0 0 0  Altered sleeping 1 - - - -  Tired, decreased energy 1 - - - -  Change in appetite 0 - - - -  Feeling bad or failure about yourself  1 - - - -  Trouble concentrating 1 - - - -  Moving slowly or fidgety/restless 0 - - - -  Suicidal thoughts 0 - - - -  PHQ-9 Score 6 - - - -  Some recent data might be hidden    Quality of Life:     Quality of Life - 01/26/16 1624      Quality of Life Scores   Health/Function Pre 14.93 %   Socioeconomic Pre 22.07 %   Psych/Spiritual Pre 16.07 %   Family Pre 19.5 %   GLOBAL Pre 17.31 %      Personal Goals: Goals established at orientation with interventions provided to work toward  goal.     Personal Goals and Risk Factors at Admission - 01/26/16 1634      Core Components/Risk Factors/Patient Goals on Admission    Weight Management Yes;Obesity   Intervention Weight Management: Develop a combined nutrition and exercise program designed to reach desired caloric intake, while maintaining appropriate intake of nutrient and fiber, sodium and fats, and appropriate energy expenditure required for the weight goal.;Weight Management: Provide education and appropriate resources to help participant work on and attain dietary goals.;Weight Management/Obesity: Establish reasonable short term and long term weight goals.;Obesity: Provide education and appropriate resources to help participant work on and attain dietary goals.   Admit Weight 239 lb 6.7 oz (108.6 kg)   Goal Weight: Short Term 233 lb (105.7 kg)   Goal Weight: Long Term 229 lb (103.9 kg)   Expected Outcomes Short Term: Continue to assess and modify interventions until short term weight is achieved;Long Term: Adherence to nutrition and physical activity/exercise program aimed toward attainment of established weight goal;Weight Loss: Understanding of general recommendations for a balanced deficit meal plan, which promotes 1-2 lb weight loss per week and includes a negative energy balance of [phone] kcal/d   Sedentary Yes   Intervention Provide advice, education, support and counseling about physical activity/exercise needs.;Develop an individualized exercise prescription for aerobic and resistive training based on initial evaluation findings, risk stratification, comorbidities and participant's personal goals.   Expected Outcomes Achievement of increased cardiorespiratory fitness and enhanced flexibility, muscular endurance and strength shown through measurements of functional capacity and personal statement of participant.   Hypertension Yes   Intervention Provide education on lifestyle modifcations including regular physical  activity/exercise, weight management, moderate sodium restriction and increased consumption of fresh fruit, vegetables, and low fat dairy, alcohol moderation, and smoking cessation.;Monitor prescription use compliance.   Expected Outcomes Short Term: Continued assessment and intervention until BP is < 140/27mm HG in hypertensive participants. < 130/50mm HG in hypertensive participants with diabetes, heart failure  or chronic kidney disease.;Long Term: Maintenance of blood pressure at goal levels.   Lipids Yes   Intervention Provide education and support for participant on nutrition & aerobic/resistive exercise along with prescribed medications to achieve LDL 70mg , HDL >40mg .   Expected Outcomes Short Term: Participant states understanding of desired cholesterol values and is compliant with medications prescribed. Participant is following exercise prescription and nutrition guidelines.;Long Term: Cholesterol controlled with medications as prescribed, with individualized exercise RX and with personalized nutrition plan. Value goals: LDL < 70mg , HDL > 40 mg.   Personal Goal Other Yes   Personal Goal Get heart stronger, so he can have diaphragm suregery.   Intervention Develop aerobic and resistance training program to increase functional capacity and cardiorespiratory fitness.    Expected Outcomes Improve functional capacity as measured by 6 minute walk test. Have diaphragm surgery once cleared by physician.       Personal Goals Discharge:     Goals and Risk Factor Review    Row Name 02/25/16 1418 04/04/16 1107           Core Components/Risk Factors/Patient Goals Review   Personal Goals Review Increase Strength and Stamina;Improve shortness of breath with ADL's Increase Strength and Stamina;Improve shortness of breath with ADL's      Review pt would absent from [facility] for 1 week due to illness. He has returned and is slowly gaining momentum again.  Will continue to follow and increase workloads as  tolerated.  Pt should begin HEP immediately Pt states he doesn't feel like his breathing is improving much and he isn't exercising much at home.  I encouraged him to start exercising at home regularly to improve his breathing with exertion      Expected Outcomes Begin HEP and have good attendance in order to increase strengh, stamina and improve overall cardiovascular fitness  If consistent with HEP and attendance to [facility] pt will increase breathing, strength, stamina and overall cardiovascular fitness          Nutrition & Weight - Outcomes:     Pre Biometrics - 01/26/16 1628      Pre Biometrics   Height 5\' 10"  (1.778 m)   Weight 239 lb 6.7 oz (108.6 kg)   Waist Circumference 46 inches   Hip Circumference 41 inches   Waist to Hip Ratio 1.12 %   BMI (Calculated) 34.4   Triceps Skinfold 27 mm   % Body Fat 34.6 %   Grip Strength 42 kg   Flexibility 0 in   Single Leg Stand 2.93 seconds       Nutrition:     Nutrition Therapy & Goals - 02/10/16 1053      Nutrition Therapy   Diet Low Sodium     Personal Nutrition Goals   Nutrition Goal 1-2 lb wt loss/week to a wt loss goal of 6-24 lb at graduation from [place], educate and counsel regarding individualized specific dietary modifications aiming towards targeted core components such as weight, hypertension, lipid management, diabetes, heart failure and other comorbidities.   Expected Outcomes Short Term Goal: Understand basic principles of dietary content, such as calories, fat, sodium, cholesterol and nutrients.;Long Term Goal: Adherence to prescribed nutrition plan.      Nutrition Discharge:     Nutrition Assessments - 02/10/16 1053      MEDFICTS Scores   Pre Score 39      Education Questionnaire Score:     Knowledge Questionnaire Score -  01/27/16 1636      Knowledge Questionnaire Score   Pre Score 17/24      Goals reviewed with patient; copy given to  patient.

## 2016-05-18 ENCOUNTER — Encounter (HOSPITAL_COMMUNITY): Payer: 59

## 2016-05-20 ENCOUNTER — Encounter (HOSPITAL_COMMUNITY): Payer: 59

## 2016-05-23 ENCOUNTER — Encounter (HOSPITAL_COMMUNITY): Payer: 59

## 2016-05-23 NOTE — Addendum Note (Signed)
Encounter addended by: Meta Hatchet on: 05/23/2016 12:14 PM<BR>    Actions taken: Flowsheet data copied forward, Flowsheet accepted

## 2016-05-23 NOTE — Addendum Note (Signed)
Encounter addended by: Meta Hatchet on: 05/23/2016  9:54 AM<BR>    Actions taken: Visit Navigator Flowsheet section accepted

## 2016-05-25 ENCOUNTER — Encounter (HOSPITAL_COMMUNITY): Payer: 59

## 2016-05-27 ENCOUNTER — Encounter (HOSPITAL_COMMUNITY): Payer: 59

## 2016-05-30 ENCOUNTER — Encounter (HOSPITAL_COMMUNITY): Payer: 59

## 2016-06-06 ENCOUNTER — Emergency Department (HOSPITAL_COMMUNITY): Payer: 59

## 2016-06-06 ENCOUNTER — Encounter (HOSPITAL_COMMUNITY): Payer: Self-pay

## 2016-06-06 ENCOUNTER — Inpatient Hospital Stay (HOSPITAL_COMMUNITY)
Admission: EM | Admit: 2016-06-06 | Discharge: 2016-06-24 | DRG: 870 | Disposition: A | Discharge status: Rehabilitation Facility | Payer: 59 | Attending: Internal Medicine | Admitting: Internal Medicine

## 2016-06-06 DIAGNOSIS — L8990 Pressure ulcer of unspecified site, unspecified stage: Secondary | ICD-10-CM | POA: Insufficient documentation

## 2016-06-06 DIAGNOSIS — T502X5A Adverse effect of carbonic-anhydrase inhibitors, benzothiadiazides and other diuretics, initial encounter: Secondary | ICD-10-CM | POA: Diagnosis not present

## 2016-06-06 DIAGNOSIS — Z9981 Dependence on supplemental oxygen: Secondary | ICD-10-CM | POA: Diagnosis not present

## 2016-06-06 DIAGNOSIS — R7881 Bacteremia: Secondary | ICD-10-CM | POA: Diagnosis not present

## 2016-06-06 DIAGNOSIS — I2510 Atherosclerotic heart disease of native coronary artery without angina pectoris: Secondary | ICD-10-CM | POA: Diagnosis present

## 2016-06-06 DIAGNOSIS — D62 Acute posthemorrhagic anemia: Secondary | ICD-10-CM | POA: Diagnosis present

## 2016-06-06 DIAGNOSIS — I472 Ventricular tachycardia, unspecified: Secondary | ICD-10-CM

## 2016-06-06 DIAGNOSIS — R0602 Shortness of breath: Secondary | ICD-10-CM | POA: Diagnosis not present

## 2016-06-06 DIAGNOSIS — I428 Other cardiomyopathies: Secondary | ICD-10-CM | POA: Diagnosis present

## 2016-06-06 DIAGNOSIS — Z95810 Presence of automatic (implantable) cardiac defibrillator: Secondary | ICD-10-CM | POA: Diagnosis not present

## 2016-06-06 DIAGNOSIS — E875 Hyperkalemia: Secondary | ICD-10-CM | POA: Diagnosis not present

## 2016-06-06 DIAGNOSIS — I420 Dilated cardiomyopathy: Secondary | ICD-10-CM | POA: Diagnosis present

## 2016-06-06 DIAGNOSIS — J189 Pneumonia, unspecified organism: Secondary | ICD-10-CM | POA: Diagnosis present

## 2016-06-06 DIAGNOSIS — M1990 Unspecified osteoarthritis, unspecified site: Secondary | ICD-10-CM | POA: Diagnosis present

## 2016-06-06 DIAGNOSIS — M109 Gout, unspecified: Secondary | ICD-10-CM | POA: Diagnosis present

## 2016-06-06 DIAGNOSIS — J40 Bronchitis, not specified as acute or chronic: Secondary | ICD-10-CM | POA: Diagnosis present

## 2016-06-06 DIAGNOSIS — F419 Anxiety disorder, unspecified: Secondary | ICD-10-CM | POA: Diagnosis present

## 2016-06-06 DIAGNOSIS — Z9289 Personal history of other medical treatment: Secondary | ICD-10-CM

## 2016-06-06 DIAGNOSIS — J810 Acute pulmonary edema: Secondary | ICD-10-CM | POA: Diagnosis not present

## 2016-06-06 DIAGNOSIS — Z6835 Body mass index (BMI) 35.0-35.9, adult: Secondary | ICD-10-CM

## 2016-06-06 DIAGNOSIS — Y848 Other medical procedures as the cause of abnormal reaction of the patient, or of later complication, without mention of misadventure at the time of the procedure: Secondary | ICD-10-CM | POA: Diagnosis present

## 2016-06-06 DIAGNOSIS — R6520 Severe sepsis without septic shock: Secondary | ICD-10-CM

## 2016-06-06 DIAGNOSIS — N183 Chronic kidney disease, stage 3 (moderate): Secondary | ICD-10-CM | POA: Diagnosis present

## 2016-06-06 DIAGNOSIS — N182 Chronic kidney disease, stage 2 (mild): Secondary | ICD-10-CM | POA: Diagnosis not present

## 2016-06-06 DIAGNOSIS — E039 Hypothyroidism, unspecified: Secondary | ICD-10-CM | POA: Diagnosis present

## 2016-06-06 DIAGNOSIS — E871 Hypo-osmolality and hyponatremia: Secondary | ICD-10-CM | POA: Diagnosis not present

## 2016-06-06 DIAGNOSIS — Z978 Presence of other specified devices: Secondary | ICD-10-CM

## 2016-06-06 DIAGNOSIS — A419 Sepsis, unspecified organism: Secondary | ICD-10-CM | POA: Diagnosis not present

## 2016-06-06 DIAGNOSIS — Z79899 Other long term (current) drug therapy: Secondary | ICD-10-CM

## 2016-06-06 DIAGNOSIS — I130 Hypertensive heart and chronic kidney disease with heart failure and stage 1 through stage 4 chronic kidney disease, or unspecified chronic kidney disease: Secondary | ICD-10-CM | POA: Diagnosis present

## 2016-06-06 DIAGNOSIS — I340 Nonrheumatic mitral (valve) insufficiency: Secondary | ICD-10-CM | POA: Diagnosis present

## 2016-06-06 DIAGNOSIS — I5042 Chronic combined systolic (congestive) and diastolic (congestive) heart failure: Secondary | ICD-10-CM | POA: Diagnosis not present

## 2016-06-06 DIAGNOSIS — I5043 Acute on chronic combined systolic (congestive) and diastolic (congestive) heart failure: Secondary | ICD-10-CM | POA: Diagnosis present

## 2016-06-06 DIAGNOSIS — N189 Chronic kidney disease, unspecified: Secondary | ICD-10-CM | POA: Diagnosis not present

## 2016-06-06 DIAGNOSIS — J181 Lobar pneumonia, unspecified organism: Secondary | ICD-10-CM | POA: Diagnosis not present

## 2016-06-06 DIAGNOSIS — I471 Supraventricular tachycardia: Secondary | ICD-10-CM | POA: Diagnosis present

## 2016-06-06 DIAGNOSIS — G9340 Encephalopathy, unspecified: Secondary | ICD-10-CM | POA: Diagnosis not present

## 2016-06-06 DIAGNOSIS — A408 Other streptococcal sepsis: Secondary | ICD-10-CM | POA: Diagnosis present

## 2016-06-06 DIAGNOSIS — J9621 Acute and chronic respiratory failure with hypoxia: Secondary | ICD-10-CM | POA: Diagnosis present

## 2016-06-06 DIAGNOSIS — R6521 Severe sepsis with septic shock: Secondary | ICD-10-CM | POA: Diagnosis present

## 2016-06-06 DIAGNOSIS — E876 Hypokalemia: Secondary | ICD-10-CM | POA: Diagnosis not present

## 2016-06-06 DIAGNOSIS — Y838 Other surgical procedures as the cause of abnormal reaction of the patient, or of later complication, without mention of misadventure at the time of the procedure: Secondary | ICD-10-CM | POA: Diagnosis present

## 2016-06-06 DIAGNOSIS — R0600 Dyspnea, unspecified: Secondary | ICD-10-CM

## 2016-06-06 DIAGNOSIS — T80219A Unspecified infection due to central venous catheter, initial encounter: Secondary | ICD-10-CM | POA: Diagnosis not present

## 2016-06-06 DIAGNOSIS — Z4659 Encounter for fitting and adjustment of other gastrointestinal appliance and device: Secondary | ICD-10-CM

## 2016-06-06 DIAGNOSIS — K5900 Constipation, unspecified: Secondary | ICD-10-CM | POA: Diagnosis not present

## 2016-06-06 DIAGNOSIS — I10 Essential (primary) hypertension: Secondary | ICD-10-CM | POA: Diagnosis not present

## 2016-06-06 DIAGNOSIS — R5381 Other malaise: Secondary | ICD-10-CM | POA: Diagnosis not present

## 2016-06-06 DIAGNOSIS — I5022 Chronic systolic (congestive) heart failure: Secondary | ICD-10-CM | POA: Diagnosis not present

## 2016-06-06 DIAGNOSIS — J9811 Atelectasis: Secondary | ICD-10-CM | POA: Diagnosis present

## 2016-06-06 DIAGNOSIS — R0682 Tachypnea, not elsewhere classified: Secondary | ICD-10-CM

## 2016-06-06 DIAGNOSIS — R739 Hyperglycemia, unspecified: Secondary | ICD-10-CM | POA: Diagnosis present

## 2016-06-06 DIAGNOSIS — T827XXA Infection and inflammatory reaction due to other cardiac and vascular devices, implants and grafts, initial encounter: Secondary | ICD-10-CM | POA: Diagnosis not present

## 2016-06-06 DIAGNOSIS — B955 Unspecified streptococcus as the cause of diseases classified elsewhere: Secondary | ICD-10-CM

## 2016-06-06 DIAGNOSIS — Z452 Encounter for adjustment and management of vascular access device: Secondary | ICD-10-CM

## 2016-06-06 DIAGNOSIS — D72829 Elevated white blood cell count, unspecified: Secondary | ICD-10-CM | POA: Diagnosis not present

## 2016-06-06 DIAGNOSIS — E785 Hyperlipidemia, unspecified: Secondary | ICD-10-CM | POA: Diagnosis present

## 2016-06-06 DIAGNOSIS — J9601 Acute respiratory failure with hypoxia: Secondary | ICD-10-CM | POA: Diagnosis not present

## 2016-06-06 DIAGNOSIS — E870 Hyperosmolality and hypernatremia: Secondary | ICD-10-CM | POA: Diagnosis not present

## 2016-06-06 DIAGNOSIS — I4729 Other ventricular tachycardia: Secondary | ICD-10-CM

## 2016-06-06 DIAGNOSIS — J986 Disorders of diaphragm: Secondary | ICD-10-CM | POA: Diagnosis present

## 2016-06-06 DIAGNOSIS — N179 Acute kidney failure, unspecified: Secondary | ICD-10-CM

## 2016-06-06 DIAGNOSIS — E861 Hypovolemia: Secondary | ICD-10-CM | POA: Diagnosis present

## 2016-06-06 DIAGNOSIS — I248 Other forms of acute ischemic heart disease: Secondary | ICD-10-CM | POA: Diagnosis present

## 2016-06-06 DIAGNOSIS — J9690 Respiratory failure, unspecified, unspecified whether with hypoxia or hypercapnia: Secondary | ICD-10-CM

## 2016-06-06 DIAGNOSIS — R000 Tachycardia, unspecified: Secondary | ICD-10-CM | POA: Diagnosis not present

## 2016-06-06 DIAGNOSIS — Z8551 Personal history of malignant neoplasm of bladder: Secondary | ICD-10-CM

## 2016-06-06 DIAGNOSIS — I509 Heart failure, unspecified: Secondary | ICD-10-CM | POA: Diagnosis not present

## 2016-06-06 DIAGNOSIS — E669 Obesity, unspecified: Secondary | ICD-10-CM | POA: Diagnosis present

## 2016-06-06 DIAGNOSIS — I5023 Acute on chronic systolic (congestive) heart failure: Secondary | ICD-10-CM | POA: Diagnosis not present

## 2016-06-06 DIAGNOSIS — I952 Hypotension due to drugs: Secondary | ICD-10-CM | POA: Diagnosis not present

## 2016-06-06 DIAGNOSIS — L8992 Pressure ulcer of unspecified site, stage 2: Secondary | ICD-10-CM | POA: Diagnosis present

## 2016-06-06 DIAGNOSIS — R570 Cardiogenic shock: Secondary | ICD-10-CM | POA: Diagnosis not present

## 2016-06-06 DIAGNOSIS — G4733 Obstructive sleep apnea (adult) (pediatric): Secondary | ICD-10-CM | POA: Diagnosis present

## 2016-06-06 LAB — GLUCOSE, CAPILLARY: Glucose-Capillary: 102 mg/dL — ABNORMAL HIGH (ref 65–99)

## 2016-06-06 LAB — I-STAT TROPONIN, ED: Troponin i, poc: 0.03 ng/mL (ref 0.00–0.08)

## 2016-06-06 LAB — CBC WITH DIFFERENTIAL/PLATELET
BASOS PCT: 1 %
Basophils Absolute: 0.1 10*3/uL (ref 0.0–0.1)
EOS ABS: 0.2 10*3/uL (ref 0.0–0.7)
Eosinophils Relative: 2 %
HCT: 50.4 % (ref 39.0–52.0)
Hemoglobin: 16.7 g/dL (ref 13.0–17.0)
Lymphocytes Relative: 23 %
Lymphs Abs: 2.2 10*3/uL (ref 0.7–4.0)
MCH: 29.9 pg (ref 26.0–34.0)
MCHC: 33.1 g/dL (ref 30.0–36.0)
MCV: 90.3 fL (ref 78.0–100.0)
MONO ABS: 1.7 10*3/uL — AB (ref 0.1–1.0)
MONOS PCT: 17 %
Neutro Abs: 5.5 10*3/uL (ref 1.7–7.7)
Neutrophils Relative %: 57 %
Platelets: 232 10*3/uL (ref 150–400)
RBC: 5.58 MIL/uL (ref 4.22–5.81)
RDW: 14.9 % (ref 11.5–15.5)
WBC: 9.5 10*3/uL (ref 4.0–10.5)

## 2016-06-06 LAB — COMPREHENSIVE METABOLIC PANEL
ALT: 51 U/L (ref 17–63)
ALT: 46 U/L (ref 17–63)
ANION GAP: 12 (ref 5–15)
AST: 76 U/L — ABNORMAL HIGH (ref 15–41)
AST: 64 U/L — ABNORMAL HIGH (ref 15–41)
Albumin: 3.3 g/dL — ABNORMAL LOW (ref 3.5–5.0)
Albumin: 3 g/dL — ABNORMAL LOW (ref 3.5–5.0)
Alkaline Phosphatase: 108 U/L (ref 38–126)
Alkaline Phosphatase: 90 U/L (ref 38–126)
Anion gap: 12 (ref 5–15)
BUN: 46 mg/dL — ABNORMAL HIGH (ref 6–20)
BUN: 47 mg/dL — ABNORMAL HIGH (ref 6–20)
CHLORIDE: 96 mmol/L — AB (ref 101–111)
CO2: 28 mmol/L (ref 22–32)
CO2: 25 mmol/L (ref 22–32)
Calcium: 9.4 mg/dL (ref 8.9–10.3)
Calcium: 8.6 mg/dL — ABNORMAL LOW (ref 8.9–10.3)
Chloride: 101 mmol/L (ref 101–111)
Creatinine, Ser: 2.69 mg/dL — ABNORMAL HIGH (ref 0.61–1.24)
Creatinine, Ser: 2.54 mg/dL — ABNORMAL HIGH (ref 0.61–1.24)
GFR calc Af Amer: 31 mL/min — ABNORMAL LOW (ref >60)
GFR calc non Af Amer: 26 mL/min — ABNORMAL LOW (ref >60)
GFR, EST AFRICAN AMERICAN: 29 mL/min — AB (ref >60)
GFR, EST NON AFRICAN AMERICAN: 25 mL/min — AB (ref >60)
Glucose, Bld: 143 mg/dL — ABNORMAL HIGH (ref 65–99)
Glucose, Bld: 89 mg/dL (ref 65–99)
POTASSIUM: 4.5 mmol/L (ref 3.5–5.1)
Potassium: 4.1 mmol/L (ref 3.5–5.1)
SODIUM: 136 mmol/L (ref 135–145)
Sodium: 138 mmol/L (ref 135–145)
Total Bilirubin: 1.4 mg/dL — ABNORMAL HIGH (ref 0.3–1.2)
Total Bilirubin: 1.8 mg/dL — ABNORMAL HIGH (ref 0.3–1.2)
Total Protein: 6.9 g/dL (ref 6.5–8.1)
Total Protein: 6.1 g/dL — ABNORMAL LOW (ref 6.5–8.1)

## 2016-06-06 LAB — TYPE AND SCREEN
ABO/RH(D): A POS
Antibody Screen: NEGATIVE

## 2016-06-06 LAB — I-STAT ARTERIAL BLOOD GAS, ED
ACID-BASE EXCESS: 6 mmol/L — AB (ref 0.0–2.0)
ACID-BASE EXCESS: 5 mmol/L — AB (ref 0.0–2.0)
BICARBONATE: 32.5 mmol/L — AB (ref 20.0–28.0)
BICARBONATE: 27.7 mmol/L (ref 20.0–28.0)
O2 Saturation: 100 %
O2 Saturation: 99 %
PH ART: 7.349 — AB (ref 7.350–7.450)
TCO2: 34 mmol/L (ref 0–100)
TCO2: 29 mmol/L (ref 0–100)
pCO2 arterial: 61 mmHg — ABNORMAL HIGH (ref 32.0–48.0)
pCO2 arterial: 39.4 mmHg (ref 32.0–48.0)
pH, Arterial: 7.467 — ABNORMAL HIGH (ref 7.350–7.450)
pO2, Arterial: 414 mmHg — ABNORMAL HIGH (ref 83.0–108.0)
pO2, Arterial: 134 mmHg — ABNORMAL HIGH (ref 83.0–108.0)

## 2016-06-06 LAB — CBC
HCT: 41.3 % (ref 39.0–52.0)
HEMOGLOBIN: 13.6 g/dL (ref 13.0–17.0)
MCH: 29.1 pg (ref 26.0–34.0)
MCHC: 32.9 g/dL (ref 30.0–36.0)
MCV: 88.4 fL (ref 78.0–100.0)
Platelets: 155 10*3/uL (ref 150–400)
RBC: 4.67 MIL/uL (ref 4.22–5.81)
RDW: 14.5 % (ref 11.5–15.5)
WBC: 12.4 10*3/uL — ABNORMAL HIGH (ref 4.0–10.5)

## 2016-06-06 LAB — I-STAT CHEM 8, ED
BUN: 83 mg/dL — AB (ref 6–20)
CALCIUM ION: 1.08 mmol/L — AB (ref 1.15–1.40)
CHLORIDE: 94 mmol/L — AB (ref 101–111)
Creatinine, Ser: 2.4 mg/dL — ABNORMAL HIGH (ref 0.61–1.24)
Glucose, Bld: 192 mg/dL — ABNORMAL HIGH (ref 65–99)
HEMATOCRIT: 56 % — AB (ref 39.0–52.0)
Hemoglobin: 19 g/dL — ABNORMAL HIGH (ref 13.0–17.0)
POTASSIUM: 6.4 mmol/L — AB (ref 3.5–5.1)
Sodium: 134 mmol/L — ABNORMAL LOW (ref 135–145)
TCO2: 33 mmol/L (ref 0–100)

## 2016-06-06 LAB — CBG MONITORING, ED: Glucose-Capillary: 191 mg/dL — ABNORMAL HIGH (ref 65–99)

## 2016-06-06 LAB — CORTISOL: Cortisol, Plasma: 13.2 ug/dL

## 2016-06-06 LAB — I-STAT CG4 LACTIC ACID, ED: LACTIC ACID, VENOUS: 5.5 mmol/L — AB (ref 0.5–1.9)

## 2016-06-06 LAB — PROCALCITONIN: Procalcitonin: 1.11 ng/mL

## 2016-06-06 LAB — PROTIME-INR
INR: 1.22
PROTHROMBIN TIME: 15.5 s — AB (ref 11.4–15.2)

## 2016-06-06 LAB — APTT: APTT: 35 s (ref 24–36)

## 2016-06-06 LAB — TROPONIN I: Troponin I: 0.14 ng/mL (ref <0.03)

## 2016-06-06 LAB — BRAIN NATRIURETIC PEPTIDE
B Natriuretic Peptide: 796 pg/mL — ABNORMAL HIGH (ref 0.0–100.0)
B Natriuretic Peptide: 553 pg/mL — ABNORMAL HIGH (ref 0.0–100.0)

## 2016-06-06 LAB — LACTIC ACID, PLASMA
LACTIC ACID, VENOUS: 1.8 mmol/L (ref 0.5–1.9)
Lactic Acid, Venous: 1.8 mmol/L (ref 0.5–1.9)

## 2016-06-06 MED ORDER — VANCOMYCIN HCL 10 G IV SOLR
2000.0000 mg | Freq: Once | INTRAVENOUS | Status: AC
  Administered 2016-06-06: 2000 mg via INTRAVENOUS
  Filled 2016-06-06: qty 2000

## 2016-06-06 MED ORDER — ALBUTEROL SULFATE (2.5 MG/3ML) 0.083% IN NEBU: 2.5000 mg | INHALATION_SOLUTION | RESPIRATORY_TRACT | Status: DC | PRN

## 2016-06-06 MED ORDER — FUROSEMIDE 10 MG/ML IJ SOLN
60.0000 mg | Freq: Once | INTRAMUSCULAR | Status: AC
  Administered 2016-06-06: 60 mg via INTRAVENOUS
  Filled 2016-06-06: qty 6

## 2016-06-06 MED ORDER — INSULIN ASPART 100 UNIT/ML ~~LOC~~ SOLN
2.0000 [IU] | SUBCUTANEOUS | Status: DC
  Administered 2016-06-09: 4 [IU] via SUBCUTANEOUS
  Administered 2016-06-09 – 2016-06-12 (×9): 2 [IU] via SUBCUTANEOUS
  Administered 2016-06-12: 4 [IU] via SUBCUTANEOUS
  Administered 2016-06-12 – 2016-06-13 (×9): 2 [IU] via SUBCUTANEOUS
  Administered 2016-06-14 (×5): 4 [IU] via SUBCUTANEOUS
  Administered 2016-06-15 (×2): 2 [IU] via SUBCUTANEOUS
  Administered 2016-06-15: 4 [IU] via SUBCUTANEOUS
  Administered 2016-06-15: 6 [IU] via SUBCUTANEOUS
  Administered 2016-06-15 – 2016-06-16 (×2): 4 [IU] via SUBCUTANEOUS
  Administered 2016-06-16 (×3): 2 [IU] via SUBCUTANEOUS

## 2016-06-06 MED ORDER — VANCOMYCIN HCL IN DEXTROSE 1-5 GM/200ML-% IV SOLN: 1000.0000 mg | Freq: Once | INTRAVENOUS | Status: DC

## 2016-06-06 MED ORDER — SODIUM CHLORIDE 0.9 % IV BOLUS (SEPSIS)
2000.0000 mL | Freq: Once | INTRAVENOUS | Status: AC
  Administered 2016-06-06: 2000 mL via INTRAVENOUS

## 2016-06-06 MED ORDER — PROPOFOL 1000 MG/100ML IV EMUL
5.0000 ug/kg/min | INTRAVENOUS | Status: DC
  Administered 2016-06-07: 5 ug/kg/min via INTRAVENOUS

## 2016-06-06 MED ORDER — SODIUM CHLORIDE 0.9 % IV SOLN
1.0000 g | Freq: Once | INTRAVENOUS | Status: AC
  Administered 2016-06-06: 1 g via INTRAVENOUS
  Filled 2016-06-06: qty 10

## 2016-06-06 MED ORDER — PIPERACILLIN-TAZOBACTAM 3.375 G IVPB 30 MIN
3.3750 g | Freq: Once | INTRAVENOUS | Status: AC
  Administered 2016-06-06: 3.375 g via INTRAVENOUS
  Filled 2016-06-06: qty 50

## 2016-06-06 MED ORDER — VANCOMYCIN HCL 10 G IV SOLR
1250.0000 mg | INTRAVENOUS | Status: DC
  Administered 2016-06-07: 1250 mg via INTRAVENOUS
  Filled 2016-06-06 (×2): qty 1250

## 2016-06-06 MED ORDER — PROPOFOL 1000 MG/100ML IV EMUL
INTRAVENOUS | Status: AC
  Filled 2016-06-06: qty 100

## 2016-06-06 MED ORDER — ETOMIDATE 2 MG/ML IV SOLN
INTRAVENOUS | Status: AC | PRN
  Administered 2016-06-06: 40 mg via INTRAVENOUS

## 2016-06-06 MED ORDER — PANTOPRAZOLE SODIUM 40 MG IV SOLR
40.0000 mg | INTRAVENOUS | Status: DC
  Administered 2016-06-06 – 2016-06-12 (×7): 40 mg via INTRAVENOUS
  Filled 2016-06-06 (×7): qty 40

## 2016-06-06 MED ORDER — SODIUM POLYSTYRENE SULFONATE 15 GM/60ML PO SUSP
15.0000 g | Freq: Once | ORAL | Status: AC
  Administered 2016-06-06: 15 g
  Filled 2016-06-06: qty 60

## 2016-06-06 MED ORDER — ACETAMINOPHEN 650 MG RE SUPP
650.0000 mg | Freq: Once | RECTAL | Status: AC
  Administered 2016-06-06: 650 mg via RECTAL
  Filled 2016-06-06: qty 1

## 2016-06-06 MED ORDER — NITROGLYCERIN IN D5W 200-5 MCG/ML-% IV SOLN
0.0000 ug/min | Freq: Once | INTRAVENOUS | Status: AC
  Administered 2016-06-06: 5 ug/min via INTRAVENOUS

## 2016-06-06 MED ORDER — TOBRAMYCIN-DEXAMETHASONE 0.3-0.1 % OP OINT
TOPICAL_OINTMENT | Freq: Two times a day (BID) | OPHTHALMIC | Status: DC
  Administered 2016-06-07 (×2): 1 via OPHTHALMIC
  Administered 2016-06-07: 02:00:00 via OPHTHALMIC
  Administered 2016-06-08: 1 via OPHTHALMIC
  Administered 2016-06-08: 11:00:00 via OPHTHALMIC
  Administered 2016-06-09 – 2016-06-10 (×4): 1 via OPHTHALMIC
  Administered 2016-06-11: 10:00:00 via OPHTHALMIC
  Administered 2016-06-11 – 2016-06-12 (×2): 1 via OPHTHALMIC
  Administered 2016-06-12: 11:00:00 via OPHTHALMIC
  Administered 2016-06-13: 1 via OPHTHALMIC
  Administered 2016-06-13 – 2016-06-14 (×2): via OPHTHALMIC
  Administered 2016-06-14 – 2016-06-15 (×2): 1 via OPHTHALMIC
  Administered 2016-06-15: 12:00:00 via OPHTHALMIC
  Administered 2016-06-16: 1 via OPHTHALMIC
  Administered 2016-06-16 – 2016-06-17 (×2): via OPHTHALMIC
  Administered 2016-06-17 – 2016-06-18 (×2): 1 via OPHTHALMIC
  Administered 2016-06-18: 09:00:00 via OPHTHALMIC
  Administered 2016-06-19: 1 via OPHTHALMIC
  Administered 2016-06-19 – 2016-06-20 (×2): via OPHTHALMIC
  Administered 2016-06-21: 1 via OPHTHALMIC
  Administered 2016-06-21 – 2016-06-24 (×6): via OPHTHALMIC
  Filled 2016-06-06 (×3): qty 3.5

## 2016-06-06 MED ORDER — PIPERACILLIN-TAZOBACTAM 3.375 G IVPB 30 MIN: 3.3750 g | Freq: Once | INTRAVENOUS | Status: DC

## 2016-06-06 MED ORDER — HEPARIN SODIUM (PORCINE) 5000 UNIT/ML IJ SOLN
5000.0000 [IU] | Freq: Three times a day (TID) | INTRAMUSCULAR | Status: DC
  Administered 2016-06-06 – 2016-06-24 (×52): 5000 [IU] via SUBCUTANEOUS
  Filled 2016-06-06 (×54): qty 1

## 2016-06-06 MED ORDER — FENTANYL CITRATE (PF) 100 MCG/2ML IJ SOLN
25.0000 ug | INTRAMUSCULAR | Status: DC | PRN
  Administered 2016-06-06 – 2016-06-08 (×7): 50 ug via INTRAVENOUS
  Filled 2016-06-06 (×6): qty 2

## 2016-06-06 MED ORDER — PIPERACILLIN-TAZOBACTAM 3.375 G IVPB
3.3750 g | Freq: Three times a day (TID) | INTRAVENOUS | Status: DC
  Administered 2016-06-07 – 2016-06-09 (×8): 3.375 g via INTRAVENOUS
  Filled 2016-06-06 (×8): qty 50

## 2016-06-06 MED ORDER — ROCURONIUM BROMIDE 50 MG/5ML IV SOLN
INTRAVENOUS | Status: AC | PRN
  Administered 2016-06-06: 120 mg via INTRAVENOUS

## 2016-06-06 MED ORDER — PROPOFOL 1000 MG/100ML IV EMUL
5.0000 ug/kg/min | INTRAVENOUS | Status: DC
  Administered 2016-06-06: 5 ug/kg/min via INTRAVENOUS

## 2016-06-06 NOTE — Progress Notes (Signed)
eLink Physician-Brief Progress Note Patient Name: [name] DOB: [birth year] MRN: [id number]   Date of Service  06/06/2016  HPI/Events of Note  Notified of need for stress ulcer prophylaxis and DVT prophylaxis.   eICU Interventions  Will order: 1. Heparin [place]. 2. SCD's 3. Protonix IV.Marland Kitchen        [name] 06/06/2016, 9:47 PM

## 2016-06-06 NOTE — Progress Notes (Signed)
Pt admitted/transferred to 2M10 from the ER. Report given to unit RRT.

## 2016-06-06 NOTE — ED Notes (Signed)
Family at bedside. 

## 2016-06-06 NOTE — Progress Notes (Signed)
Critical Troponin received 0.14, Dr. Genene Churn notified.

## 2016-06-06 NOTE — Progress Notes (Signed)
Trop 0.14. Patient is intubated, not in distress or agitation. Obtained 12 lead which looks same as prior, has deep S waves (could be old Q waves too), no acute ST changes, same as prior EKG. BP MAP remains in 80's for now. Urine output remains low. Not on pressor.  Hey may mainly have septic shock rather than CHF (weight is almost similar to March admission). Hold off diuresis for now. Already got IVF 2L.   - cont to monitor BP and trend trops. If BP drops, can try more IVF gentle boluses and will require pressor that fails.

## 2016-06-06 NOTE — ED Provider Notes (Addendum)
Seen on arrival :level5 caveat due to acuity of situation. Patient was pulled out of a car. He was too weak to get out on his own. He complains of severe shortness of breath. No other complaint. Denies cough denies chest pain. On exam patient is ill-appearing, severe respiratory distress. Able to speak only one or 2 words at a time skin is grossly diaphoretic HEENT exam no facial asymmetry neck supple positive JVD lungs diminished breath sounds diffusely heart tachycardic regular rhythm abdomen obese, nontender extremities without edema. Patient placed on BiPAP. Cardiac monitor showed sinus tachycardia 1 30 bpm IV Lasix and IV nitroglycerin drip ordered to titrate to systolic blood pressure 458. Chest x-ray viewed by me  Further history from patient's wife he began to feel better yesterday complaining of shortness of breath.  At 5:05 PM patient states his breathing is no better after treatment with BiPAP, intravenous Lasix and intravenous nitroglycerin drip he is somewhat somnolent. It was decided to intubate the patient. At 5:17 PM he was intubated by Dr.Caudill orally using direct laryngoscopy. I was present during the entire procedure and supervised the procedure.  6:40 PM after temperature obtained concern for sepsis. Code sepsis called. IV fluids, IV antibiotics ordered empirically. Source of infection presently unknown Noted to be acutely hypoxic on arrival. ED ECG REPORT   Date: 06/06/2016  Rate: 1745  Rhythm: sinus tachycardia  QRS Axis: left  Intervals: normal  ST/T Wave abnormalities: nonspecific T wave changes  Conduction Disutrbances:right bundle branch block  Narrative Interpretation:   Old EKG Reviewed: changes noted T wave changes seen in previous tracing from earlier today have resolved I have personally reviewed the EKG tracing and disagree with the computerized printout as noted.   EKG is not consistent with acute MI Sepsis - Repeat Assessment  Performed  at:    645pm  Vitals     Blood pressure 99/60, pulse (!) 101, temperature (!) 104.5 F (40.3 C), temperature source Rectal, resp. rate (!) 22, height 5\' 6"  (1.676 m), weight 245 lb (111.1 kg), SpO2 98 %.  Heart:     Regular rate and rhythm  Lungs:    [place]  Capillary Refill:   <2 sec  Peripheral Pulse:   Radial pulse palpable  Skin:     Normal Color   Critical care service consulted and will arrange for admission. Diagnosis #1 acute hypoxic respiratory failure #2 sepsis CRITICAL CARE Performed by: Orlie Dakin Total critical care time: 45 minutes Critical care time was exclusive of separately billable procedures and treating other patients. Critical care was necessary to treat or prevent imminent or life-threatening deterioration. Critical care was time spent personally by me on the following activities: development of treatment plan with patient and/or surrogate as well as nursing, discussions with consultants, evaluation of patient's response to treatment, examination of patient, obtaining history from patient or surrogate, ordering and performing treatments and interventions, ordering and review of laboratory studies, ordering and review of radiographic studies, pulse oximetry and re-evaluation of patient's condition.    Orlie Dakin, MD 06/06/16 0998    Orlie Dakin, MD 06/06/16 [phone]

## 2016-06-06 NOTE — H&P (Signed)
Name: [name] MRN: [id number] DOB: 06-15-1960    LOS: 0  PCCM ADMISSION NOTE  History of Present Illness: 56M w/ PMHx of CHF, hemidiaphragm s/p paraganglioma resection, CKD, CAD, ICD, and hypothyroidism who presents w/ SOB. He was picking his wife up today when he develop SOB and diaphoresis. He was placed on 6L O2 with O2 sat of 83% that was then transitioned to BIPAP. Pt did not improve on BIPAP, IV lasix 60mg , IV nitro gtt w/ increased RR and diaphoresis which prompted intubation. He developed a fever of 104.43F and code sepsis was called. He received 1L NS, vanc, and zosyn. Post intubation CXR + for low lung volumes w/ mild vascular congestion. Potassium 6.4, lactic acid 5.50, BNP 796.  Lines / Drains: ETT>> 4/2  Cultures: Blood cultures x 2 >>4/2 UA culture >> 4/2  Antibiotics: Vanc >>4/2 Zosyn >>4/2  Tests / Events: CXR 4/2 >> mild vascular congestion, stable cardiomegaly  The patient is sedated, intubated and unable to provide history, which was obtained for available medical records.    Past Medical History:  Diagnosis Date  . Arthritis    GOUT  . Automatic implantable cardioverter-defibrillator in situ   . Biventricular ICD (implantable cardiac defibrillator) [employer]onic ]    DOI 2008/ upgrade 2010/ Gen Change 2014  . Bladder tumor    PT HOSP AT [facility] 4/24 TO 4/26 2014 WITH UTI--AND FOUND TO HAVE BLADDER TUMOR-  . Cardiomyopathy    Idiopathic dilated;   . CHF (congestive heart failure) ([place])   . CKD (chronic kidney disease)    stage III baseline Crt 1.8-2.0  . Coronary artery disease   . Gout 08/03/12   PT C/O OF RIGHT KNEE PAIN AND SWELLING -STATES GOUT FLARE UP - ONGOING SINCE APRIL - BUT SWELLING W/IN LAST WEEK  . Hilar mass    Noted CT 2010  . HTN (hypertension)    Dr. Caryl Comes cardiologist  . Hypothyroidism   . 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 Sleep apnea    CPAP  . Systolic CHF ([place])    EF 58%   . Ventricular tachycardia [facility])    s/p ICD   Past Surgical History:  Procedure Laterality Date  . BIV ICD GENERTAOR CHANGE OUT     DOI 2008/ upgrade 2010/ Gen Change 2014   . CARDIAC CATHETERIZATION     he was found to have normal coronary arteries but with a globally dilated and hypocontractile heart  . CARDIAC DEFIBRILLATOR PLACEMENT     DOI 2008/ upgrade 2010/ Gen Change 2014   . CHEST TUBE INSERTION  09/10/2013  . CHOLECYSTECTOMY    . cornea replacement    . ENDOBRONCHIAL ULTRASOUND Bilateral 10/01/2012   Procedure: ENDOBRONCHIAL ULTRASOUND;  Surgeon: Collene Gobble, MD;  Location: [facility] ENDOSCOPY;  Service: Cardiopulmonary;  Laterality: Bilateral;  . IMPLANTABLE CARDIOVERTER DEFIBRILLATOR (ICD) GENERATOR CHANGE N/A 05/02/2012   Procedure: ICD GENERATOR CHANGE;  Surgeon: Deboraha Sprang, MD;  Location: [facility] CATH LAB;  Service: Cardiovascular;  Laterality: N/A;  . MEDIASTINOSCOPY N/A 10/25/2012   Procedure: MEDIASTINOSCOPY;  Surgeon: [name], MD;  Location: [place];  Service: Thoracic;  Laterality: N/A;  . MEDIASTINOTOMY  09/10/2013   paratracheal mass    DR Darcey Nora  . MEDIASTINOTOMY CHAMBERLAIN MCNEIL Right 09/10/2013   Procedure: MEDIASTINOTOMY CHAMBERLAIN MCNEIL;  Surgeon: [name], MD;  Location: [place];  Service: Thoracic;  Laterality: Right;  . RESECTION OF MEDIASTINAL MASS  Right 02/13/2014   Procedure: RESECTION OF MEDIASTINAL MASS;  Surgeon: [name], MD;  Location: [facility] OR;  Service: Thoracic;  Laterality: Right;  PATIENT NEEDS EPIDURAL CATHETER AND A SWAN GANZ CATHETER (DR. CHRIS MOSER AWARE PER PVT)  . RIGHT HEART CATHETERIZATION N/A 01/27/2012   Procedure: RIGHT HEART CATH;  Surgeon: Jolaine Artist, MD;  Location: [facility] CATH LAB;  Service: Cardiovascular;  Laterality: N/A;  . TONSILLECTOMY    . TRANSURETHRAL RESECTION OF BLADDER TUMOR N/A 08/13/2012   Procedure: TRANSURETHRAL RESECTION OF BLADDER TUMOR (TURBT);  Surgeon: Claybon Jabs, MD;  Location: [facility] ORS;   Service: Urology;  Laterality: N/A;  MITOMYCIN C  . US ECHOCARDIOGRAPHY  03-17-2008, 07-03-2006   EF 15-20%, EF 15-20%  . VIDEO ASSISTED THORACOSCOPY (VATS)/THOROCOTOMY Right 02/13/2014   Procedure: VIDEO ASSISTED THORACOSCOPY (VATS)/THOROCOTOMY;  Surgeon: [name], MD;  Location: [facility] OR;  Service: Thoracic;  Laterality: Right;  PATIENT NEEDS EPIDURAL CATHETER (DR. CHRIS MOSER AWARE PER PVT)  . VIDEO BRONCHOSCOPY N/A 10/25/2012   Procedure: VIDEO BRONCHOSCOPY;  Surgeon: [name], MD;  Location: [facility] OR;  Service: Thoracic;  Laterality: N/A;   Prior to Admission medications   Medication Sig Start Date End Date Taking? Authorizing Provider  atorvastatin (LIPITOR) 40 MG tablet TAKE 1 TABLET BY MOUTH AT BEDTIME 09/03/15   Shaune Pascal Bensimhon, MD  BREO ELLIPTA 100-25 MCG/INH AEPB Inhale 1 puff into the lungs daily. 12/11/14   Historical Provider, MD  carvedilol (COREG) 12.5 MG tablet Take 12.5 mg by mouth 2 (two) times daily with a meal.    Historical Provider, MD  Febuxostat (ULORIC) 80 MG TABS Take 80 mg by mouth at bedtime.    Historical Provider, MD  gabapentin (NEURONTIN) 300 MG capsule Take 1 capsule by mouth 3 (three) times daily. Pain 06/25/15   Historical Provider, MD  hydrALAZINE (APRESOLINE) 50 MG tablet Take 1 tablet (50 mg total) by mouth 2 (two) times daily. 05/05/16   Deboraha Sprang, MD  Hydrocodone-Acetaminophen 10-300 MG TABS Take 1 tablet by mouth every 6 (six) hours as needed.    Historical Provider, MD  magnesium oxide (MAG-OX) 400 MG tablet Take two tablets (800 mg) by mouth twice daily 09/22/15   Deboraha Sprang, MD  MITIGARE 0.6 MG CAPS Take 0.6 mg by mouth daily. 07/28/15   Larey Dresser, MD  tobramycin-dexamethasone [facility]) ophthalmic solution Place 2 drops into the right eye 2 (two) times daily. 12/09/13   Historical Provider, MD  torsemide (DEMADEX) 10 MG tablet Take 10 mg by mouth daily. Taking 10 mg every other day.    Historical Provider, MD   Allergies Allergies   Allergen Reactions  . Bidil [Isosorb Dinitrate-Hydralazine]     Headaches   . Spironolactone     Hyperkalemia   . Hydralazine Hcl     Fuzzy headed    Family History No family history on file.  Social History  reports that he has never smoked. He has never used smokeless tobacco. He reports that he does not drink alcohol or use drugs.  Review Of Systems  11 points review of systems is negative with an exception of listed in HPI.  Vital Signs: Temp:  [98.3 F (36.8 C)-104.5 F (40.3 C)] 104.5 F (40.3 C) (04/02 1736) Pulse Rate:  [122-133] 122 (04/02 1736) Resp:  [12-42] 16 (04/02 1736) BP: (105-172)/(84-159) 112/94 (04/02 1736) SpO2:  [64 %-100 %] 97 % (04/02 1736) FiO2 (%):  [100 %] 100 % (04/02 1720) Weight:  [  245 lb (111.1 kg)] 245 lb (111.1 kg) (04/02 1635) No intake/output data recorded.  Physical Examination: General:  Sedated, well nourished Neuro:  RASS -2  HEENT:  Moist mucous membranes, rt eye pterygium Neck:  No JVD appreciable, supple Cardiovascular: tachycardic, no rubs or gallops Lungs:  Coarse breath sounds b/l w/ faint wheezing.  Abdomen:  Soft, active bowel sounds Musculoskeletal:  Trace pedal edema Skin:  Warm and dry  Ventilator settings: Vent Mode: PRVC FiO2 (%):  [100 %] 100 % Set Rate:  [8 bmp-16 bmp] 16 bmp Vt Set:  [580 mL] 580 mL PEEP:  [5 cmH20-6 cmH20] 5 cmH20  Labs and Imaging:  Reviewed.  Please refer to the Assessment and Plan section for relevant results.  [name] 06/06/2016, 5:58 PM  DISCUSSION: 56 year old male with extensive PMH to include systolic heart failure with EF of 10% in 2016 and no echo on record since who was coming in to pick up in his wife when he started feeling SOB and dizzy.  Was brought into the ED was noted to be desaturating and was started on BiPAP.  BiPAP helped with oxygenation but WOB remained high and patient was intubated.  Lactic acidosis was noted on labs.  On exam, diffuse crackles noted.  I  reviewed CXR myself, ETT in good position and pulmonary edema noted.  Discussed with EDP.  Febrile and hypertensive.  ASSESSMENT / PLAN:  PULMONARY A: VDRF due to pulmonary edema and concern for bronchitis. History of a paralyzed hemidiaphragm History of Paraganglioma P:   - Full vent support. - ABG and adjust vent. - CXR and ABG in AM - Will hold off weaning for now.  CARDIOVASCULAR A:  EF 10% Hypertension P:  - Propofol, now on hold for hypotension - If remains hypotensive then will need a central line for CVP and pressors. - Hold further fluid.  RENAL A:   AKI Hyperkalemia P:   - KVO IVF - Kayexalate. - F/U BMET - Replace electrolytes as indicated.  GASTROINTESTINAL A:   No active issues P:   - TF in AM if remains intubated  HEMATOLOGIC A:   No active issues P:  - CBC in AM - Transfuse per ICU protocol  INFECTIOUS A:   Febrile, unknown source P:   - Vanc - Zosyn - Pan culture - PCT  ENDOCRINE A:   Hyperglycemia   P:   - ISS - CBGs  NEUROLOGIC A:   Sedated but prior to that neurologic exam was normal. P:   RASS goal: 0 to -2 - Propofol (on hold for now) - PRN fentanyl.  FAMILY  - Updates: No family bedside to update  - Inter-disciplinary family meet or Palliative Care meeting due by:  day 7  The patient is critically ill with multiple organ systems failure and requires high complexity decision making for assessment and support, frequent evaluation and titration of therapies, application of advanced monitoring technologies and extensive interpretation of multiple databases.   Critical Care Time devoted to patient care services described in this note is  35  Minutes. This time reflects time of care of this signee Dr Jennet Maduro. This critical care time does not reflect procedure time, or teaching time or supervisory time of PA/NP/Med student/Med Resident etc but could involve care discussion time.  Rush Farmer, M.D. [facility]  Pulmonary/Critical Care Medicine. Pager: [phone]. After hours pager: [phone].  06/06/2016, 6:32 PM

## 2016-06-06 NOTE — Progress Notes (Signed)
Pharmacy Antibiotic Note  [name] is a 57 y.o. male admitted on 06/06/2016 with sepsis.  Pharmacy has been consulted for vancomycin and zosyn dosing. Acute renal failure noted with sCr 2.4 and CrCl 40 ml/min.   Vancomycin trough goal 15-20  Plan: 1) Vancomycin 2g IV x 1 then 1250mg  IV q24 2) Zosyn 3.375g IV q8 (4 hour infusion) 3) Follow renal function, cultures, LOT, level as needed  Height: 5\' 6"  (167.6 cm) Weight: 245 lb (111.1 kg) IBW/kg (Calculated) : 63.8  Temp (24hrs), Avg:101.4 F (38.6 C), Min:98.3 F (36.8 C), Max:104.5 F (40.3 C)   Recent Labs Lab 06/06/16 1644 06/06/16 1650  WBC 9.5  --   CREATININE  --  2.40*  LATICACIDVEN  --  5.50*    Estimated Creatinine Clearance: 39.7 mL/min (A) (by C-G formula based on SCr of 2.4 mg/dL (H)).    Allergies  Allergen Reactions  . Bidil [Isosorb Dinitrate-Hydralazine]     Headaches   . Spironolactone     Hyperkalemia   . Hydralazine Hcl     Fuzzy headed    Antimicrobials this admission: 4/2 Vancomycin >> 4/2 Zosyn >>  Dose adjustments this admission: n/a  Microbiology results: None yet  Thank you for allowing pharmacy to be a part of this patient's care.  Deboraha Sprang 06/06/2016 5:51 PM

## 2016-06-06 NOTE — ED Notes (Signed)
Awaiting vancomoycin from pharmacy

## 2016-06-06 NOTE — ED Provider Notes (Signed)
[facility] DEPT Provider Note   CSN: [id number] Arrival date & time: 06/06/16  1629     History   Chief Complaint Chief Complaint  Patient presents with  . Shortness of Breath    HPI [name] is a 56 y.o. male.  The history is provided by the patient, medical records and the spouse.  Shortness of Breath  This is a recurrent problem. The average episode lasts 3 days. The current episode started more than 2 days ago. The problem has been gradually worsening. Pertinent negatives include no chest pain. Associated medical issues include heart failure. Associated medical issues do not include COPD or DVT.    PMH CHF (EF 10%, s/p ICD) presents for shortness of breath. Pt was coming to pick his wife up from the hospital and was noted to be tachypnic and diaphoretic. Wife called for help and patient was brought into ED for evaluation. Pt has recently been receiving IV lasix in clinic for volume overload. His wife states that he looked ill this morning and she advised him not to go to work but he insisted. Denies chest pain. No hx DVT/PE.   Past Medical History:  Diagnosis Date  . Arthritis    GOUT  . Automatic implantable cardioverter-defibrillator in situ   . Biventricular ICD (implantable cardiac defibrillator) [employer] ]    DOI 2008/ upgrade 2010/ Gen Change 2014  . Bladder tumor    PT HOSP AT [facility] 4/24 TO 4/26 2014 WITH UTI--AND FOUND TO HAVE BLADDER TUMOR-  . Cardiomyopathy    Idiopathic dilated;   . CHF (congestive heart failure) ([place])   . CKD (chronic kidney disease)    stage III baseline Crt 1.8-2.0  . Coronary artery disease   . Gout 08/03/12   PT C/O OF RIGHT KNEE PAIN AND SWELLING -STATES GOUT FLARE UP - ONGOING SINCE APRIL - BUT SWELLING W/IN LAST WEEK  . Hilar mass    Noted CT 2010  . HTN (hypertension)    Dr. Caryl Comes cardiologist  . Hypothyroidism   . 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 Sleep apnea    CPAP  . Systolic CHF ([place])    EF 74%  . Ventricular tachycardia [facility])    s/p ICD    Patient Active Problem List   Diagnosis Date Noted  . Acute respiratory failure with hypoxemia ([place]) 06/06/2016  . CKD (chronic kidney disease) stage 3, GFR 30-59 ml/min 02/01/2015  . 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 Acute on chronic congestive heart failure ([place])   . CHF exacerbation ([place]) 01/31/2015  . Acute on chronic heart failure ([place]) 01/31/2015  . Acute kidney injury superimposed on chronic kidney disease ([place]) 01/31/2015  . Gout flare 01/31/2015  . OSA on CPAP 01/31/2015  . Leukocytosis 01/31/2015  . 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 History of cardiac arrhythmia 09/19/2014  . CKD (chronic kidney disease) stage 2, GFR 60-89 ml/min 09/19/2014  . Other fatigue 09/19/2014  . Hip pain 09/19/2014  . History of gout 09/19/2014  . History of bladder carcinoma 09/19/2014  . 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 Chest discomfort 09/10/2013  . Bladder cancer ([place]) 11/09/2012  . Preoperative cardiovascular examination 09/11/2012  . Malignant tumor of urinary bladder ([place]) 07/13/2012  . Chronic combined systolic and diastolic CHF (congestive heart failure) ([place]) 06/28/2012  . Gout 05/17/2012  . Inappropriate shocks from ICD -secondary T wave oversensing in the context of hyperkalemia 04/12/2012  . Automatic implantable  cardioverter-defibrillator in situ 05/17/2011  . 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 ORTHOSTATIC DIZZINESS 11/03/2009  . VENTRICULAR TACHYCARDIA 10/20/2008  . SYSTOLIC HEART FAILURE, CHRONIC 10/20/2008  . Dyspnea and respiratory abnormality 03/25/2008  . Obstructive sleep apnea 03/12/2007  . Essential hypertension, benign 03/12/2007  . CARDIOMYOPATHY 03/12/2007    Past Surgical History:  Procedure Laterality Date  . BIV ICD GENERTAOR CHANGE OUT     DOI 2008/ upgrade 2010/ Gen Change 2014   . CARDIAC CATHETERIZATION     he was found to have normal  coronary arteries but with a globally dilated and hypocontractile heart  . CARDIAC DEFIBRILLATOR PLACEMENT     DOI 2008/ upgrade 2010/ Gen Change 2014   . CHEST TUBE INSERTION  09/10/2013  . CHOLECYSTECTOMY    . cornea replacement    . ENDOBRONCHIAL ULTRASOUND Bilateral 10/01/2012   Procedure: ENDOBRONCHIAL ULTRASOUND;  Surgeon: Collene Gobble, MD;  Location: [facility];  Service: Cardiopulmonary;  Laterality: Bilateral;  . IMPLANTABLE CARDIOVERTER DEFIBRILLATOR (ICD) GENERATOR CHANGE N/A 05/02/2012   Procedure: ICD GENERATOR CHANGE;  Surgeon: Deboraha Sprang, MD;  Location: [facility] CATH LAB;  Service: Cardiovascular;  Laterality: N/A;  . MEDIASTINOSCOPY N/A 10/25/2012   Procedure: MEDIASTINOSCOPY;  Surgeon: [name], MD;  Location: [facility];  Service: Thoracic;  Laterality: N/A;  . MEDIASTINOTOMY  09/10/2013   paratracheal mass    DR Darcey Nora  . MEDIASTINOTOMY CHAMBERLAIN MCNEIL Right 09/10/2013   Procedure: MEDIASTINOTOMY CHAMBERLAIN MCNEIL;  Surgeon: [name], MD;  Location: [facility];  Service: Thoracic;  Laterality: Right;  . RESECTION OF MEDIASTINAL MASS Right 02/13/2014   Procedure: RESECTION OF MEDIASTINAL MASS;  Surgeon: [name], MD;  Location: [facility] OR;  Service: Thoracic;  Laterality: Right;  PATIENT NEEDS EPIDURAL CATHETER AND A SWAN GANZ CATHETER (DR. CHRIS MOSER AWARE PER PVT)  . RIGHT HEART CATHETERIZATION N/A 01/27/2012   Procedure: RIGHT HEART CATH;  Surgeon: Jolaine Artist, MD;  Location: [facility] CATH LAB;  Service: Cardiovascular;  Laterality: N/A;  . TONSILLECTOMY    . TRANSURETHRAL RESECTION OF BLADDER TUMOR N/A 08/13/2012   Procedure: TRANSURETHRAL RESECTION OF BLADDER TUMOR (TURBT);  Surgeon: Claybon Jabs, MD;  Location: [facility] ORS;  Service: Urology;  Laterality: N/A;  MITOMYCIN C  . US ECHOCARDIOGRAPHY  03-17-2008, 07-03-2006   EF 15-20%, EF 15-20%  . VIDEO ASSISTED THORACOSCOPY (VATS)/THOROCOTOMY Right 02/13/2014   Procedure: VIDEO ASSISTED THORACOSCOPY  (VATS)/THOROCOTOMY;  Surgeon: [name], MD;  [facility] OR;  Service: Thoracic;  Laterality: Right;  PATIENT NEEDS EPIDURAL CATHETER (DR. CHRIS MOSER AWARE PER PVT)  . VIDEO BRONCHOSCOPY N/A 10/25/2012   Procedure: VIDEO BRONCHOSCOPY;  Surgeon: [name], MD;  Location: [facility] OR;  Service: Thoracic;  Laterality: N/A;       Home Medications    Prior to Admission medications   Medication Sig Start Date End Date Taking? Authorizing Provider  atorvastatin (LIPITOR) 40 MG tablet TAKE 1 TABLET BY MOUTH AT BEDTIME 09/03/15  Yes Jolaine Artist, MD  gabapentin (NEURONTIN) 300 MG capsule Take 1 capsule by mouth 2 (two) times daily. Pain 06/25/15  Yes Historical Provider, MD  hydrALAZINE (APRESOLINE) 50 MG tablet Take 1 tablet (50 mg total) by mouth 2 (two) times daily. Patient taking differently: Take 25 mg by mouth 2 (two) times daily.  05/05/16  Yes Deboraha Sprang, MD  magnesium oxide (MAG-OX) 400 MG tablet Take two tablets (800 mg) by mouth twice daily 09/22/15  Yes Deboraha Sprang,  MD  BREO ELLIPTA 100-25 MCG/INH AEPB Inhale 1 puff into the lungs daily. 12/11/14   Historical Provider, MD  carvedilol (COREG) 12.5 MG tablet Take 12.5 mg by mouth 2 (two) times daily with a meal.    Historical Provider, MD  Febuxostat (ULORIC) 80 MG TABS Take 80 mg by mouth at bedtime.    Historical Provider, MD  Hydrocodone-Acetaminophen 10-300 MG TABS Take 0.5-1 tablets by mouth every 6 (six) hours as needed.     Historical Provider, MD  MITIGARE 0.6 MG CAPS Take 0.6 mg by mouth daily. 07/28/15   Larey Dresser, MD  tobramycin-dexamethasone [facility]) ophthalmic solution Place 2 drops into the right eye 2 (two) times daily. 12/09/13   Historical Provider, MD  torsemide (DEMADEX) 10 MG tablet Take 10 mg by mouth daily. Or as directed by your physician    Historical Provider, MD    Family History No family history on file.  Social History Social History  Substance Use Topics  . Smoking status: Never  Smoker  . Smokeless tobacco: Never Used  . Alcohol use No     Allergies   Bidil [isosorb dinitrate-hydralazine]; Spironolactone; and Hydralazine hcl   Review of Systems Review of Systems  Unable to perform ROS: Severe respiratory distress  Respiratory: Positive for shortness of breath.   Cardiovascular: Negative for chest pain.     Physical Exam Updated Vital Signs BP 92/74 (BP Location: Right Arm)   Pulse 73   Temp 98.3 F (36.8 C) (Oral)   Resp (!) 22   Ht 5' 6"  (1.676 m) Comment: measure per RRT  Wt 111.1 kg   SpO2 99%   BMI 39.54 kg/m   Physical Exam  Constitutional: He is oriented to person, place, and time. He appears well-developed and well-nourished. He appears distressed.  HENT:  Head: Normocephalic and atraumatic.  Eyes: Conjunctivae and EOM are normal. Pupils are equal, round, and reactive to light.  Neck: Neck supple. JVD present.  Cardiovascular: Regular rhythm.  Tachycardia present.   No murmur heard. Pulmonary/Chest: He is in respiratory distress. He has decreased breath sounds (diffuse).  Abdominal: Soft. He exhibits distension. There is no tenderness.  Musculoskeletal: Normal range of motion. He exhibits no edema.  Neurological: He is alert and oriented to person, place, and time.  Skin: Skin is warm. Capillary refill takes less than 2 seconds. He is diaphoretic.  Nursing note and vitals reviewed.    ED Treatments / Results  Labs (all labs ordered are listed, but only abnormal results are displayed) Labs Reviewed  CBC WITH DIFFERENTIAL/PLATELET - Abnormal; Notable for the following:       Result Value   Monocytes Absolute 1.7 (*)    All other components within normal limits  BRAIN NATRIURETIC PEPTIDE - Abnormal; Notable for the following:    B Natriuretic Peptide 796.0 (*)    All other components within normal limits  COMPREHENSIVE METABOLIC PANEL - Abnormal; Notable for the following:    Chloride 96 (*)    Glucose, Bld 143 (*)    BUN 46  (*)    Creatinine, Ser 2.69 (*)    Albumin 3.3 (*)    AST 76 (*)    Total Bilirubin 1.4 (*)    GFR calc non Af Amer 25 (*)    GFR calc Af Amer 29 (*)    All other components within normal limits  CBC - Abnormal; Notable for the following:    WBC 12.4 (*)    All other components  within normal limits  COMPREHENSIVE METABOLIC PANEL - Abnormal; Notable for the following:    BUN 47 (*)    Creatinine, Ser 2.54 (*)    Calcium 8.6 (*)    Total Protein 6.1 (*)    Albumin 3.0 (*)    AST 64 (*)    Total Bilirubin 1.8 (*)    GFR calc non Af Amer 26 (*)    GFR calc Af Amer 31 (*)    All other components within normal limits  TROPONIN I - Abnormal; Notable for the following:    Troponin I 0.14 (*)    All other components within normal limits  PROTIME-INR - Abnormal; Notable for the following:    Prothrombin Time 15.5 (*)    All other components within normal limits  BRAIN NATRIURETIC PEPTIDE - Abnormal; Notable for the following:    B Natriuretic Peptide 553.0 (*)    All other components within normal limits  GLUCOSE, CAPILLARY - Abnormal; Notable for the following:    Glucose-Capillary 102 (*)    All other components within normal limits  CBG MONITORING, ED - Abnormal; Notable for the following:    Glucose-Capillary 191 (*)    All other components within normal limits  I-STAT CHEM 8, ED - Abnormal; Notable for the following:    Sodium 134 (*)    Potassium 6.4 (*)    Chloride 94 (*)    BUN 83 (*)    Creatinine, Ser 2.40 (*)    Glucose, Bld 192 (*)    Calcium, Ion 1.08 (*)    Hemoglobin 19.0 (*)    HCT 56.0 (*)    All other components within normal limits  I-STAT CG4 LACTIC ACID, ED - Abnormal; Notable for the following:    Lactic Acid, Venous 5.50 (*)    All other components within normal limits  I-STAT ARTERIAL BLOOD GAS, ED - Abnormal; Notable for the following:    pH, Arterial 7.349 (*)    pCO2 arterial 61.0 (*)    pO2, Arterial 414.0 (*)    Bicarbonate 32.5 (*)     Acid-Base Excess 6.0 (*)    All other components within normal limits  I-STAT ARTERIAL BLOOD GAS, ED - Abnormal; Notable for the following:    pH, Arterial 7.467 (*)    pO2, Arterial 134.0 (*)    Acid-Base Excess 5.0 (*)    All other components within normal limits  CULTURE, BLOOD (ROUTINE X 2)  CULTURE, BLOOD (ROUTINE X 2)  MRSA PCR SCREENING  LACTIC ACID, PLASMA  LACTIC ACID, PLASMA  CORTISOL  PROCALCITONIN  APTT  GLUCOSE, CAPILLARY  URINALYSIS, ROUTINE W REFLEX MICROSCOPIC  BLOOD GAS, ARTERIAL  URINALYSIS, ROUTINE W REFLEX MICROSCOPIC  BASIC METABOLIC PANEL  PROCALCITONIN  CBC  TROPONIN I  TROPONIN I  TROPONIN I  I-STAT TROPOININ, ED  TYPE AND SCREEN    EKG  EKG Interpretation  Date/Time:  Monday June 06 2016 17:45:50 EDT Ventricular Rate:  112 PR Interval:    QRS Duration: 144 QT Interval:  356 QTC Calculation: 486 R Axis:   -88 Text Interpretation:  Sinus tachycardia Multiple ventricular premature complexes LAE, consider biatrial enlargement Right bundle branch block Inferior infarct, acute (LCx) Anterior infarct, acute Lateral leads are also involved Confirmed by Winfred Leeds  MD, SAM [phone]) on 06/06/2016 6:25:36 PM       Radiology Dg Chest Portable 1 View  Result Date: 06/06/2016 CLINICAL DATA:  Respiratory failure, post intubation EXAM: PORTABLE CHEST 1 VIEW COMPARISON:  06/06/2016  CXR at 1641 hours FINDINGS: New endotracheal tube tip is noted 3 cm above the carina. The cardiac silhouette is stable and mildly enlarged in appearance as before. Aorta is not aneurysmal. There is mild central vascular congestion. ICD device projects over the left shoulder with single pacing lead in the right atrium and pacing and defibrillator leads in the right ventricle. Chronic right fifth and seventh rib fractures with callus formation. No pulmonary consolidation or effusion. No pneumothorax. Gastric tube extends into the body of the stomach. IMPRESSION: 1. Satisfactory support  line and tube positions. 2. Low lung volumes with mild vascular congestion and stable cardiomegaly. 3. ICD device in place. Electronically Signed   By: Ashley Royalty M.D.   On: 06/06/2016 17:51   Dg Chest Port 1 View  Result Date: 06/06/2016 CLINICAL DATA:  Shortness of breath. History of congestive heart failure, hypertension and sleep apnea. EXAM: PORTABLE CHEST 1 VIEW COMPARISON:  Radiographs 04/08/2016.  CT 04/08/2015. FINDINGS: 1641 hours. Left subclavian AICD leads appear unchanged. There is stable cardiomegaly and chronic right basilar scarring. Old rib fractures are present on the right. No evidence of edema, confluent airspace opacity, pleural effusion or pneumothorax. IMPRESSION: Stable post operative chest.  No acute cardiopulmonary process. Electronically Signed   By: Richardean Sale M.D.   On: 06/06/2016 16:56    Procedures Procedure Name: Intubation Date/Time: 06/06/2016 5:25 PM Performed by: Monico Blitz Pre-anesthesia Checklist: Patient identified, Emergency Drugs available and Suction available Oxygen Delivery Method: Ambu bag Preoxygenation: Pre-oxygenation with 100% oxygen Intubation Type: Rapid sequence Ventilation: Mask ventilation without difficulty Laryngoscope Size: Mac and 4 Grade View: Grade I Tube size: 8.0 mm Number of attempts: 1 Airway Equipment and Method: Stylet Placement Confirmation: ETT inserted through vocal cords under direct vision,  Positive ETCO2 and Breath sounds checked- equal and bilateral Secured at: 23 (lip) cm Tube secured with: ETT holder      (including critical care time)  Medications Ordered in ED Medications  piperacillin-tazobactam (ZOSYN) IVPB 3.375 g (not administered)  vancomycin (VANCOCIN) 1,250 mg in sodium chloride 0.9 % 250 mL IVPB (not administered)  insulin aspart (novoLOG) injection 2-6 Units (2 Units Subcutaneous Not Given 06/07/16 0000)  albuterol (PROVENTIL) (2.5 MG/3ML) 0.083% nebulizer solution 2.5 mg (not  administered)  tobramycin-dexamethasone (TOBRADEX) ophthalmic ointment (not administered)  propofol (DIPRIVAN) 1000 MG/100ML infusion (0 mcg/kg/min  111.1 kg Intravenous Hold 06/06/16 1934)  fentaNYL (SUBLIMAZE) injection 25-50 mcg (50 mcg Intravenous Given 06/06/16 2335)  pantoprazole (PROTONIX) injection 40 mg (40 mg Intravenous Given 06/06/16 2237)  heparin injection 5,000 Units (5,000 Units Subcutaneous Given 06/06/16 2237)  furosemide (LASIX) injection 60 mg (60 mg Intravenous Given 06/06/16 1647)  nitroGLYCERIN 50 mg in dextrose 5 % 250 mL (0.2 mg/mL) infusion (0 mcg/min Intravenous Stopped 06/06/16 1730)  calcium gluconate 1 g in sodium chloride 0.9 % 100 mL IVPB (0 g Intravenous Stopped 06/06/16 1757)  etomidate (AMIDATE) injection (40 mg Intravenous Given 06/06/16 1717)  rocuronium (ZEMURON) injection (120 mg Intravenous Given 06/06/16 1718)  sodium chloride 0.9 % bolus 2,000 mL (0 mLs Intravenous Stopped 06/06/16 1823)  piperacillin-tazobactam (ZOSYN) IVPB 3.375 g (0 g Intravenous Stopped 06/06/16 1823)  acetaminophen (TYLENOL) suppository 650 mg (650 mg Rectal Given 06/06/16 1754)  vancomycin (VANCOCIN) 2,000 mg in sodium chloride 0.9 % 500 mL IVPB (2,000 mg Intravenous Transfusing/Transfer 06/06/16 1941)  sodium polystyrene (KAYEXALATE) 15 GM/60ML suspension 15 g (15 g Per Tube Given 06/06/16 1930)     Initial Impression / Assessment and Plan / ED  Course  I have reviewed the triage vital signs and the nursing notes.  Pertinent labs & imaging results that were available during my care of the patient were reviewed by me and considered in my medical decision making (see chart for details).    56 y.o. male PMH severe CHF presents for acute respiratory distress. Diaphoretic, tachypnic 40s, decreased air movement in all fields, sating 70s on 6L, sinus tachycardia 130s. Placed on Bipap, given IV lasix and nitro gtt with improvement in saturations to 100%, no improvement in WOB. CXR shows mild pulmonary edema. Due  to continued respiratory distress pt was intubated via RSI as documented above. Propofol gtt for sedation. - Hyperkalemia with peaked T waves. Calcium gluconate given - lactic acidosis 5.5 - Febrile 104.5 rectal, code sepsis initiated. Blood cultures obtained. Empiric Vanc/Zosyn given. Tylenol suppository. 2L NS bolus ordered given very poor EF, discussed with Critical Care and they advised to hold fluid unless patient became hypotensive. - AKI BUN 83, Cr 2.4 from 1.7 Care transferred to Critical Care team for admission.   Final Clinical Impressions(s) / ED Diagnoses   Final diagnoses:  Acute respiratory failure with hypoxia [facility])    New Prescriptions Current Discharge Medication List       Monico Blitz, MD 06/07/16 0106    Orlie Dakin, MD 06/07/16 1407

## 2016-06-06 NOTE — ED Notes (Signed)
Critical care at bedside  

## 2016-06-06 NOTE — Progress Notes (Signed)
Called by Rapid Response RN for patient slumped over outside of [facility] entrance. Cardiac Rehab RN with patient outside. Pt was placed on stretcher and transported to ED with Rapid Response and Cardiac Rehab RN. Pt was placed on 6L [place] during transport, pt diaphoretic with increased RR and WOB. Placed on monitor with Spo2 83% on 6L. Pt was then placed on NRB until BIPAP could be set up with Spo2 76%. Placed on BIPAP per Dr. Winfred Leeds at beside. Awaiting further orders.

## 2016-06-06 NOTE — ED Triage Notes (Signed)
Pt presents to the ed after arriving to rehab to pick his wife up and had to be pulled out of the car, he has been having shortness of breath for a few days and presents, pale, diaphoretic and short of breath. Feels like he is having a chf exacerbation. edp at bedside, sats 64%, placed on bipap

## 2016-06-06 NOTE — Progress Notes (Signed)
Called to assist with patient who was unable to get out of the car, he was in the [facility] circle to pick up his wife.  Assisted to stretcher.  See Katey's note for patient description.  Transported to ED for medical treatment.  Staff at bedside.

## 2016-06-06 NOTE — ED Notes (Signed)
Pt is progressively getting worse with bipap, breathing 40 times a minute, increasingly diaphoretic, states he does not feel any better, wife at bedside, after talking with her and the doctor, decision made to intubate. Pt and family understand

## 2016-06-07 ENCOUNTER — Encounter (HOSPITAL_COMMUNITY): Payer: Self-pay | Admitting: Physician Assistant

## 2016-06-07 ENCOUNTER — Inpatient Hospital Stay (HOSPITAL_COMMUNITY): Payer: 59

## 2016-06-07 DIAGNOSIS — N189 Chronic kidney disease, unspecified: Secondary | ICD-10-CM

## 2016-06-07 DIAGNOSIS — J189 Pneumonia, unspecified organism: Secondary | ICD-10-CM

## 2016-06-07 LAB — BASIC METABOLIC PANEL
Anion gap: 14 (ref 5–15)
BUN: 48 mg/dL — AB (ref 6–20)
CALCIUM: 8.9 mg/dL (ref 8.9–10.3)
CHLORIDE: 98 mmol/L — AB (ref 101–111)
CO2: 27 mmol/L (ref 22–32)
CREATININE: 2.84 mg/dL — AB (ref 0.61–1.24)
GFR, EST AFRICAN AMERICAN: 27 mL/min — AB (ref >60)
GFR, EST NON AFRICAN AMERICAN: 23 mL/min — AB (ref >60)
Glucose, Bld: 94 mg/dL (ref 65–99)
Potassium: 3.7 mmol/L (ref 3.5–5.1)
SODIUM: 139 mmol/L (ref 135–145)

## 2016-06-07 LAB — CBC
HCT: 40 % (ref 39.0–52.0)
Hemoglobin: 13.3 g/dL (ref 13.0–17.0)
MCH: 29.2 pg (ref 26.0–34.0)
MCHC: 33.3 g/dL (ref 30.0–36.0)
MCV: 87.9 fL (ref 78.0–100.0)
PLATELETS: 161 10*3/uL (ref 150–400)
RBC: 4.55 MIL/uL (ref 4.22–5.81)
RDW: 14.4 % (ref 11.5–15.5)
WBC: 9.5 10*3/uL (ref 4.0–10.5)

## 2016-06-07 LAB — GLUCOSE, CAPILLARY
GLUCOSE-CAPILLARY: 92 mg/dL (ref 65–99)
GLUCOSE-CAPILLARY: 89 mg/dL (ref 65–99)
Glucose-Capillary: 82 mg/dL (ref 65–99)
Glucose-Capillary: 88 mg/dL (ref 65–99)
Glucose-Capillary: 91 mg/dL (ref 65–99)
Glucose-Capillary: 92 mg/dL (ref 65–99)
Glucose-Capillary: 96 mg/dL (ref 65–99)

## 2016-06-07 LAB — MRSA PCR SCREENING: MRSA BY PCR: NEGATIVE

## 2016-06-07 LAB — URINALYSIS, ROUTINE W REFLEX MICROSCOPIC
BILIRUBIN URINE: NEGATIVE
Glucose, UA: NEGATIVE mg/dL
HGB URINE DIPSTICK: NEGATIVE
KETONES UR: NEGATIVE mg/dL
Leukocytes, UA: NEGATIVE
NITRITE: NEGATIVE
PROTEIN: NEGATIVE mg/dL
Specific Gravity, Urine: 1.009 (ref 1.005–1.030)
pH: 7 (ref 5.0–8.0)

## 2016-06-07 LAB — TROPONIN I
TROPONIN I: 0.14 ng/mL — AB (ref <0.03)
Troponin I: 0.12 ng/mL (ref <0.03)
Troponin I: 0.17 ng/mL (ref <0.03)

## 2016-06-07 LAB — PROCALCITONIN: PROCALCITONIN: 5.98 ng/mL

## 2016-06-07 MED ORDER — FUROSEMIDE 10 MG/ML IJ SOLN
40.0000 mg | Freq: Four times a day (QID) | INTRAMUSCULAR | Status: AC
  Administered 2016-06-07 (×3): 40 mg via INTRAVENOUS
  Filled 2016-06-07 (×4): qty 4

## 2016-06-07 MED ORDER — CHLORHEXIDINE GLUCONATE 0.12% ORAL RINSE (MEDLINE KIT)
15.0000 mL | Freq: Two times a day (BID) | OROMUCOSAL | Status: DC
  Administered 2016-06-07 – 2016-06-18 (×23): 15 mL via OROMUCOSAL

## 2016-06-07 MED ORDER — FENTANYL CITRATE (PF) 100 MCG/2ML IJ SOLN
50.0000 ug | Freq: Once | INTRAMUSCULAR | Status: AC
  Administered 2016-06-07: 50 ug via INTRAVENOUS

## 2016-06-07 MED ORDER — ORAL CARE MOUTH RINSE
15.0000 mL | Freq: Four times a day (QID) | OROMUCOSAL | Status: DC
  Administered 2016-06-07 – 2016-06-18 (×43): 15 mL via OROMUCOSAL

## 2016-06-07 MED ORDER — POTASSIUM CHLORIDE 20 MEQ/15ML (10%) PO SOLN
40.0000 meq | Freq: Three times a day (TID) | ORAL | Status: AC
  Administered 2016-06-07 (×2): 40 meq
  Filled 2016-06-07 (×2): qty 30

## 2016-06-07 MED ORDER — FENTANYL CITRATE (PF) 100 MCG/2ML IJ SOLN
INTRAMUSCULAR | Status: AC
  Filled 2016-06-07: qty 2

## 2016-06-07 NOTE — Progress Notes (Addendum)
PULMONARY  / CRITICAL CARE MEDICINE  Name: [name] MRN: [id number] DOB: 09-Oct-1960    LOS: 41  REFERRING MD :  Dr. Winfred Leeds  CHIEF COMPLAINT:  SOB  BRIEF PATIENT DESCRIPTION: 62M w/ PMHx of CHF, hemidiaphragm s/p paraganglioma resection, CKD, CAD, ICD, and hypothyroidism who presents w/ SOB who was initially tx for CHF exacerbation in the ED and then worked up for sepsis after developing temp of 104.40F while in the ED.   Lines / Drains: ETT>> 4/2  Cultures: Blood cultures x 2 >>4/2 UA culture >>   Antibiotics: Vanc >>4/2 Zosyn >>4/2  Tests / Events: CXR 4/2 >> mild vascular congestion, stable cardiomegaly  Subjective: Mental status much improved this AM.   VITAL SIGNS: Temp:  [98.3 F (36.8 C)-104.5 F (40.3 C)] 98.5 F (36.9 C) (04/03 0312) Pulse Rate:  [37-133] 78 (04/03 0700) Resp:  [12-42] 22 (04/03 0700) BP: (79-172)/(59-159) 106/83 (04/03 0700) SpO2:  [64 %-100 %] 100 % (04/03 0700) FiO2 (%):  [40 %-100 %] 40 % (04/03 0323) Weight:  [245 lb (111.1 kg)] 245 lb (111.1 kg) (04/02 1635)   HEMODYNAMICS:   VENTILATOR SETTINGS: Vent Mode: PRVC FiO2 (%):  [40 %-100 %] 40 % Set Rate:  [8 bmp-22 bmp] 22 bmp Vt Set:  [510 mL-580 mL] 510 mL PEEP:  [5 cmH20-6 cmH20] 5 cmH20 Plateau Pressure:  [21 cmH20-24 cmH20] 21 cmH20  INTAKE / OUTPUT: Intake/Output      04/02 0701 - 04/03 0700 04/03 0701 - 04/04 0700   Other 120    IV Piggyback 50    Total Intake(mL/kg) 170 (1.5)    Urine (mL/kg/hr) 1175    Total Output 1175     Net -1005           PHYSICAL EXAMINATION: General:  NAD, alert and interactive Neuro:  RASS 0, moving all ext toc ommand HEENT:  [place]/AT, PERRL, EOM-I and MMM Cardiovascular:  RRR, no murmurs Lungs:  Decreased BS on the right Abdomen:  Soft, non tender, non distended Skin:  Warm and dry  LABS: Cbc  Recent Labs Lab 06/06/16 1644 06/06/16 1650 06/06/16 1925 06/07/16 0057  WBC 9.5  --  12.4* 9.5  HGB 16.7 19.0* 13.6 13.3   HCT 50.4 56.0* 41.3 40.0  PLT 232  --  155 161   Chemistry  Recent Labs Lab 06/06/16 1816 06/06/16 1925 06/07/16 0057  NA 136 138 139  K 4.5 4.1 3.7  CL 96* 101 98*  CO2 28 25 27   BUN 46* 47* 48*  CREATININE 2.69* 2.54* 2.84*  CALCIUM 9.4 8.6* 8.9  GLUCOSE 143* 89 94   Liver fxn  Recent Labs Lab 06/06/16 1816 06/06/16 1925  AST 76* 64*  ALT 51 46  ALKPHOS 108 90  BILITOT 1.4* 1.8*  PROT 6.9 6.1*  ALBUMIN 3.3* 3.0*   coags  Recent Labs Lab 06/06/16 1925  APTT 35  INR 1.22   Sepsis markers  Recent Labs Lab 06/06/16 1650 06/06/16 1925 06/06/16 2116 06/07/16 0057  LATICACIDVEN 5.50* 1.8 1.8  --   PROCALCITON  --  1.11  --  5.98   Cardiac markers  Recent Labs Lab 06/06/16 1925 06/07/16 0057  TROPONINI 0.14* 0.14*   BNP No results for input(s): PROBNP in the last 168 hours. ABG  Recent Labs Lab 06/06/16 1650 06/06/16 1800 06/06/16 1940  PHART  --  7.349* 7.467*  PCO2ART  --  61.0* 39.4  PO2ART  --  414.0* 134.0*  HCO3  --  32.5* 27.7  TCO2 33 34 29   CBG trend  Recent Labs Lab 06/06/16 1636 06/06/16 2002 06/07/16 0012 06/07/16 0311  GLUCAP 191* 102* 96 92   IMAGING:  ECG:  DIAGNOSES: Active Problems:   Acute respiratory failure with hypoxemia (HCC)  ASSESSMENT / PLAN:  PULMONARY A: VDRF due to pulmonary edema and concern for bronchitis. History of a paralyzed hemidiaphragm History of Paraganglioma P:   - Low tidal volumes on wean attempt, CTM - CXR today to look for PNA as cause of sepsis  - Hold PS trials for now (failed this AM)  CARDIOVASCULAR A:  EF 10% Hypertension P:  - blood pressures improved  RENAL A:   AKI Hyperkalemia P:   - KVO IVF - hyperkalemia resolved s/p Kayexalate. - Replace electrolytes as indicated.  GASTROINTESTINAL A:   No active issues P:   - TF in AM if remains intubated  HEMATOLOGIC A:   No active issues P:  - CBC in AM - Transfuse per ICU  protocol  INFECTIOUS A:   Febrile, unknown source P:   - Vanc - Zosyn - Pan culture, still need to obtain urine culture - PCT elevated  ENDOCRINE A:   Hyperglycemia   P:   - ISS - CBGs  NEUROLOGIC A:   Sedated but prior to that neurologic exam was normal. P:   RASS goal: 0 to -2 - Propofol (on hold for now) - PRN fentanyl  [name], MD Internal Medicine Resident, PGY [facility] Pager: [phone]  Attending Note:  56 year old male with CHF with EF of 10% who presents with PNA and VDRF.  Patient also has a paralyzed right hemi-diaphragm and fluid overload.  On exam, crackles noted.  I reviewed CXR myself, left sided infiltrate worse.  Will continue abx.  Failed weaning today, will hold off further.  Will order lasix 40 mg IV q6 x3 doses.  Replace electrolytes preemptively give K of 3.7.  Troponin leak is likely demand ischemia, will not escalate.  Place back on full support.  Restart propofol for agitation.  Fentanyl PRN.  Will continue to monitor for now.  The patient is critically ill with multiple organ systems failure and requires high complexity decision making for assessment and support, frequent evaluation and titration of therapies, application of advanced monitoring technologies and extensive interpretation of multiple databases.   Critical Care Time devoted to patient care services described in this note is  35  Minutes. This time reflects time of care of this signee Dr Jennet Maduro. This critical care time does not reflect procedure time, or teaching time or supervisory time of PA/NP/Med student/Med Resident etc but could involve care discussion time.  Rush Farmer, M.D. [facility] Pulmonary/Critical Care Medicine. Pager: [phone]. After hours pager: [phone].  06/07/2016, 7:35 AM

## 2016-06-07 NOTE — Progress Notes (Signed)
placed on ps 5/5 per CCM. Did not tolerate. Extreme resp distress, incr WOB, RR, HR with desat to 79%. placed on full support and medicated with fent and started propofol. CCM aware.

## 2016-06-07 NOTE — Consult Note (Signed)
ELECTROPHYSIOLOGY CONSULT NOTE    Patient ID: [name] MRN: [id number], DOB/AGE: Aug 23, 1960 56 y.o.  Admit date: 06/06/2016 Date of Consult: 06/07/2016   Primary Physician: Gennette Pac, MD Primary Cardiologist: Dr. Caryl Comes  Reason for Consultation: fever/ICD in place  HPI: [name] is a 56 y.o. male who is being seen today for the evaluation of febrile illness with an CRT-D in situ at the request of Dr. Nelda Marseille.  The patient admitted to [facility] yesterday with acute onset SOB, repiratory failure requiring intubation, he was noted febrile with temp 104.5, and code sepsis initiated.  PMHx includes CHF (follows at [facility]), paralyzed hemidiaphragm s/p paraganglioma ressection,CKD, CAD, ICD, and hypothyroidism, OSA w/CPAP, gout.  He follows with Dr. Caryl Comes, who notes hx of SVT/AVNRT and ATach as well.  HPI is obtained from the chart, the patient is intubated, he is awake, shakes head "no" to c/o pain.   Device information: MDT CRT-D upgrade 04/11/08, original device  (RV lead) 2008, Dr. Caryl Comes, VT/CM + hx of VT + AAD tx w/amiodarone stopped 2/2 dizziness + hx of inappropriate shocks 2/2 T oversensing, device was reprogrammed to decrease sensitivity from 0.3-0.6 mV and the NID was increased  Past Medical History:  Diagnosis Date  . Arthritis    GOUT  . Automatic implantable cardioverter-defibrillator in situ   . Biventricular ICD (implantable cardiac defibrillator) [employer]onic ]    DOI 2008/ upgrade 2010/ Gen Change 2014  . Bladder tumor    PT HOSP AT [facility] 4/24 TO 4/26 2014 WITH UTI--AND FOUND TO HAVE BLADDER TUMOR-  . Cardiomyopathy    Idiopathic dilated;   . CHF (congestive heart failure) ([place])   . CKD (chronic kidney disease)    stage III baseline Crt 1.8-2.0  . Coronary artery disease   . Gout 08/03/12   PT C/O OF RIGHT KNEE PAIN AND SWELLING -STATES GOUT FLARE UP - ONGOING SINCE APRIL - BUT SWELLING W/IN LAST WEEK  . Hilar mass    Noted CT 2010  .  HTN (hypertension)    Dr. Caryl Comes cardiologist  . Hypothyroidism   . 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 Sleep apnea    CPAP  . Systolic CHF ([place])    EF 21%  . Ventricular tachycardia [facility])    s/p ICD     Surgical History:  Past Surgical History:  Procedure Laterality Date  . BIV ICD GENERTAOR CHANGE OUT     DOI 2008/ upgrade 2010/ Gen Change 2014   . CARDIAC CATHETERIZATION     he was found to have normal coronary arteries but with a globally dilated and hypocontractile heart  . CARDIAC DEFIBRILLATOR PLACEMENT     DOI 2008/ upgrade 2010/ Gen Change 2014   . CHEST TUBE INSERTION  09/10/2013  . CHOLECYSTECTOMY    . cornea replacement    . ENDOBRONCHIAL ULTRASOUND Bilateral 10/01/2012   Procedure: ENDOBRONCHIAL ULTRASOUND;  Surgeon: Collene Gobble, MD;  Location: [facility] ENDOSCOPY;  Service: Cardiopulmonary;  Laterality: Bilateral;  . IMPLANTABLE CARDIOVERTER DEFIBRILLATOR (ICD) GENERATOR CHANGE N/A 05/02/2012   Procedure: ICD GENERATOR CHANGE;  Surgeon: Deboraha Sprang, MD;  Location: [facility] CATH LAB;  Service: Cardiovascular;  Laterality: N/A;  . MEDIASTINOSCOPY N/A 10/25/2012   Procedure: MEDIASTINOSCOPY;  Surgeon: [name], MD;  Location: [place];  Service: Thoracic;  Laterality: N/A;  . MEDIASTINOTOMY  09/10/2013   paratracheal mass    DR Darcey Nora  . MEDIASTINOTOMY  CHAMBERLAIN MCNEIL Right 09/10/2013   Procedure: MEDIASTINOTOMY CHAMBERLAIN MCNEIL;  Surgeon: [name], MD;  Location: [place];  Service: Thoracic;  Laterality: Right;  . RESECTION OF MEDIASTINAL MASS Right 02/13/2014   Procedure: RESECTION OF MEDIASTINAL MASS;  Surgeon: [name], MD;  Location[facility] OR;  Service: Thoracic;  Laterality: Right;  PATIENT NEEDS EPIDURAL CATHETER AND A SWAN GANZ CATHETER (DR. CHRIS MOSER AWARE PER PVT)  . RIGHT HEART CATHETERIZATION N/A 01/27/2012   Procedure: RIGHT HEART CATH;  Surgeon: Jolaine Artist, MD;  Location: [facility] CATH  LAB;  Service: Cardiovascular;  Laterality: N/A;  . TONSILLECTOMY    . TRANSURETHRAL RESECTION OF BLADDER TUMOR N/A 08/13/2012   Procedure: TRANSURETHRAL RESECTION OF BLADDER TUMOR (TURBT);  Surgeon: Claybon Jabs, MD;  Location: [facility] ORS;  Service: Urology;  Laterality: N/A;  MITOMYCIN C  . US ECHOCARDIOGRAPHY  03-17-2008, 07-03-2006   EF 15-20%, EF 15-20%  . VIDEO ASSISTED THORACOSCOPY (VATS)/THOROCOTOMY Right 02/13/2014   Procedure: VIDEO ASSISTED THORACOSCOPY (VATS)/THOROCOTOMY;  Surgeon: [name], MD;  Location: [facility] OR;  Service: Thoracic;  Laterality: Right;  PATIENT NEEDS EPIDURAL CATHETER (DR. CHRIS MOSER AWARE PER PVT)  . VIDEO BRONCHOSCOPY N/A 10/25/2012   Procedure: VIDEO BRONCHOSCOPY;  Surgeon: [name], MD;  Location: [facility] OR;  Service: Thoracic;  Laterality: N/A;     Prescriptions Prior to Admission  Medication Sig Dispense Refill Last Dose  . atorvastatin (LIPITOR) 40 MG tablet TAKE 1 TABLET BY MOUTH AT BEDTIME 30 tablet 2 06/05/2016 at pm  . gabapentin (NEURONTIN) 300 MG capsule Take 1 capsule by mouth 2 (two) times daily. Pain   06/06/2016 at am  . hydrALAZINE (APRESOLINE) 50 MG tablet Take 1 tablet (50 mg total) by mouth 2 (two) times daily. (Patient taking differently: Take 25 mg by mouth 2 (two) times daily. ) 180 tablet 3 06/06/2016 at am  . magnesium oxide (MAG-OX) 400 MG tablet Take two tablets (800 mg) by mouth twice daily 120 tablet 11 06/06/2016 at am  . BREO ELLIPTA 100-25 MCG/INH AEPB Inhale 1 puff into the lungs daily.  11 Taking  . carvedilol (COREG) 12.5 MG tablet Take 12.5 mg by mouth 2 (two) times daily with a meal.   Taking  . Febuxostat (ULORIC) 80 MG TABS Take 80 mg by mouth at bedtime.   Taking  . Hydrocodone-Acetaminophen 10-300 MG TABS Take 0.5-1 tablets by mouth every 6 (six) hours as needed.    Taking  . MITIGARE 0.6 MG CAPS Take 0.6 mg by mouth daily. 30 capsule 6 Taking  . tobramycin-dexamethasone (TOBRADEX) ophthalmic solution Place 2 drops into the  right eye 2 (two) times daily.  5 Taking  . torsemide (DEMADEX) 10 MG tablet Take 10 mg by mouth daily. Or as directed by your physician   Taking    Inpatient Medications:  . chlorhexidine gluconate (MEDLINE KIT)  15 mL Mouth Rinse BID  . furosemide  40 mg Intravenous Q6H  . heparin  5,000 Units Subcutaneous Q8H  . insulin aspart  2-6 Units Subcutaneous Q4H  . mouth rinse  15 mL Mouth Rinse QID  . pantoprazole (PROTONIX) IV  40 mg Intravenous Q24H  . piperacillin-tazobactam (ZOSYN)  IV  3.375 g Intravenous Q8H  . potassium chloride  40 mEq Per Tube TID  . tobramycin-dexamethasone   Right Eye BID  . vancomycin  1,250 mg Intravenous Q24H    Allergies:  Allergies  Allergen Reactions  . Bidil [Isosorb Dinitrate-Hydralazine] Other (See Comments)  Headaches   . Spironolactone Other (See Comments)    Hyperkalemia   . Hydralazine Hcl Other (See Comments)    "Fuzzy headed" feeling    Social History   Social History  . Marital status: Married    Spouse name: N/A  . Number of children: 2  . Years of education: N/A   Occupational History  . [employer]   Social History Main Topics  . Smoking status: Never Smoker  . Smokeless tobacco: Never Used  . Alcohol use No  . Drug use: No  . Sexual activity: No   Other Topics Concern  . Not on file   Social History Narrative  . No narrative on file     Family History  Problem Relation Age of Onset  . Family history unknown: Yes  Unable to obtain family history, patient is intubated    Review of Systems: Unable to obtain, patient is intubated  Physical Exam: Vitals:   06/07/16 1158 06/07/16 1200 06/07/16 1227 06/07/16 1300  BP:  111/88 113/80 108/82  Pulse:  91 96 91  Resp:  (!) 22  (!) 22  Temp: (!) 100.8 F (38.2 C)     TempSrc: Oral     SpO2:  100% 100% 99%  Weight:      Height:        GEN- The patient is ill appearing, awake, alert, intubated.   HEENT: normocephalic, atraumatic; sclera  clear, conjunctiva pink; hearing intact;  Lymph- no cervical lymphadenopathy Lungs- Clear to ausculation bilaterally ant/lat b/l, intubated on vent support Heart- Regular rate and rhythm, 2/6SM, PMI not laterally displaced GI- soft, non-tender, non-distended Extremities- no clubbing, cyanosis, or edema MS- no significant deformity or atrophy Skin- warm and dry, no rash or lesion Psych- unable to assess Neuro- unable to assess  Labs:   Lab Results  Component Value Date   WBC 9.5 06/07/2016   HGB 13.3 06/07/2016   HCT 40.0 06/07/2016   MCV 87.9 06/07/2016   PLT 161 06/07/2016     Recent Labs Lab 06/06/16 1925 06/07/16 0057  NA 138 139  K 4.1 3.7  CL 101 98*  CO2 25 27  BUN 47* 48*  CREATININE 2.54* 2.84*  CALCIUM 8.6* 8.9  PROT 6.1*  --   BILITOT 1.8*  --   ALKPHOS 90  --   ALT 46  --   AST 64*  --   GLUCOSE 89 94      Radiology/Studies:  Dg Chest Port 1 View Result Date: 06/07/2016 CLINICAL DATA:  Acute onset shortness of breath 06/06/2016. Patient intubated. EXAM: PORTABLE CHEST 1 VIEW COMPARISON:  Single-view of the chest 06/06/2016 and CT chest 04/08/2015. FINDINGS: Support tubes and lines are unchanged and project in good position. There is cardiomegaly and vascular congestion. Bibasilar airspace disease is worse on the left. Multiple remote right rib fractures are again seen. IMPRESSION: ETT and NG tube project in good position. Left worse than right basilar airspace disease could be due to atelectasis or pneumonia. No change in cardiomegaly and vascular congestion. Electronically Signed   By: Inge Rise M.D.   On: 06/07/2016 08:59    Dg Chest Port 1 View Result Date: 06/06/2016 CLINICAL DATA:  Shortness of breath. History of congestive heart failure, hypertension and sleep apnea. EXAM: PORTABLE CHEST 1 VIEW COMPARISON:  Radiographs 04/08/2016.  CT 04/08/2015. FINDINGS: 1641 hours. Left subclavian AICD leads appear unchanged. There is stable cardiomegaly and  chronic right basilar scarring. Old rib fractures are  present on the right. No evidence of edema, confluent airspace opacity, pleural effusion or pneumothorax. IMPRESSION: Stable post operative chest.  No acute cardiopulmonary process. Electronically Signed   By: Richardean Sale M.D.   On: 06/06/2016 16:56    Reviewed by myself: EKG: #1 ST 129bpm, LBBB #2 ST 112bpm, RBBB (appears similar to out patient EKG) TELEMETRY: SR/ Vpaced  10/29/14 TTE Study Conclusions - Left ventricle: The cavity size was severely dilated. Wall   thickness was increased in a pattern of mild LVH. Systolic   function was severely reduced. The estimated ejection fraction   was 10%. Diffuse hypokinesis. Doppler parameters are consistent   with restrictive physiology, indicative of decreased left   ventricular diastolic compliance and/or increased left atrial   pressure. - Aortic valve: There was mild regurgitation. - Mitral valve: There was severe regurgitation. - Left atrium: The atrium was severely dilated. - Pericardium, extracardiac: A trivial pericardial effusion was   identified. Impressions: - Severe global reduction in LV function; mild LVH; severe LVE;   restrictive filling; severe LAE; mildly reduced RV function; mild   AI; severe MR.   Assessment and Plan:   1. Respiratory failure     Intubated     Pulmonary CCM, feel pulmonary edema w/concern for bronchitis  2. Febrile illness  3. Hx of NICM CHF, by last echo EF 10% w/severe MR     Monitor fluid status closely     Currently -1652m  4. CRT-ICD     So far blood cultures negative x24 hours     Follow    Signed, RTommye Standard PA-C 06/07/2016 3:22 PM    I have seen and examined this patient with RTommye Standard  Agree with above, note added to reflect my findings.  On exam, regular rhythm, no murmurs, lungs clear. Presented to the hospital after SOB and respiratory failure requiring intubation. Found to have a fever to 104.51F and code  sepsis initiated. Patient with CRT-D for NICM. Blood cultures negative to date. Would plan to monitor culture and if positive, may benefit from lead extraction. He is currently volume overloaded by BNP. Agree with continued diuresis as BP allows.     Lizett Chowning M. Ravin Bendall MD 06/07/2016 3:41 PM

## 2016-06-08 ENCOUNTER — Other Ambulatory Visit (HOSPITAL_COMMUNITY): Payer: 59

## 2016-06-08 ENCOUNTER — Inpatient Hospital Stay (HOSPITAL_COMMUNITY): Payer: 59

## 2016-06-08 DIAGNOSIS — L8990 Pressure ulcer of unspecified site, unspecified stage: Secondary | ICD-10-CM | POA: Insufficient documentation

## 2016-06-08 LAB — CBC
HEMATOCRIT: 44.3 % (ref 39.0–52.0)
Hemoglobin: 14.8 g/dL (ref 13.0–17.0)
MCH: 29.2 pg (ref 26.0–34.0)
MCHC: 33.4 g/dL (ref 30.0–36.0)
MCV: 87.4 fL (ref 78.0–100.0)
Platelets: 172 10*3/uL (ref 150–400)
RBC: 5.07 MIL/uL (ref 4.22–5.81)
RDW: 15.3 % (ref 11.5–15.5)
WBC: 11.2 10*3/uL — AB (ref 4.0–10.5)

## 2016-06-08 LAB — BASIC METABOLIC PANEL
ANION GAP: 14 (ref 5–15)
BUN: 50 mg/dL — ABNORMAL HIGH (ref 6–20)
CO2: 27 mmol/L (ref 22–32)
Calcium: 9.1 mg/dL (ref 8.9–10.3)
Chloride: 100 mmol/L — ABNORMAL LOW (ref 101–111)
Creatinine, Ser: 3.66 mg/dL — ABNORMAL HIGH (ref 0.61–1.24)
GFR calc Af Amer: 20 mL/min — ABNORMAL LOW (ref >60)
GFR, EST NON AFRICAN AMERICAN: 17 mL/min — AB (ref >60)
GLUCOSE: 89 mg/dL (ref 65–99)
POTASSIUM: 4.1 mmol/L (ref 3.5–5.1)
Sodium: 141 mmol/L (ref 135–145)

## 2016-06-08 LAB — GLUCOSE, CAPILLARY
GLUCOSE-CAPILLARY: 102 mg/dL — AB (ref 65–99)
Glucose-Capillary: 85 mg/dL (ref 65–99)
Glucose-Capillary: 99 mg/dL (ref 65–99)
Glucose-Capillary: 93 mg/dL (ref 65–99)
Glucose-Capillary: 96 mg/dL (ref 65–99)

## 2016-06-08 LAB — BLOOD GAS, ARTERIAL
Acid-Base Excess: 3.9 mmol/L — ABNORMAL HIGH (ref 0.0–2.0)
BICARBONATE: 26.9 mmol/L (ref 20.0–28.0)
Drawn by: 36277
FIO2: 40
LHR: 22 {breaths}/min
O2 Saturation: 98.4 %
PEEP: 5 cmH2O
Patient temperature: 98.6
VT: 510 mL
pCO2 arterial: 33.6 mmHg (ref 32.0–48.0)
pH, Arterial: 7.515 — ABNORMAL HIGH (ref 7.350–7.450)
pO2, Arterial: 129 mmHg — ABNORMAL HIGH (ref 83.0–108.0)

## 2016-06-08 LAB — PROCALCITONIN: Procalcitonin: 12.28 ng/mL

## 2016-06-08 LAB — MAGNESIUM
MAGNESIUM: 2.1 mg/dL (ref 1.7–2.4)
Magnesium: 2.3 mg/dL (ref 1.7–2.4)

## 2016-06-08 LAB — PHOSPHORUS
PHOSPHORUS: 4.1 mg/dL (ref 2.5–4.6)
Phosphorus: 5.3 mg/dL — ABNORMAL HIGH (ref 2.5–4.6)

## 2016-06-08 LAB — TRIGLYCERIDES: Triglycerides: 115 mg/dL (ref <150)

## 2016-06-08 MED ORDER — MIDAZOLAM HCL 2 MG/2ML IJ SOLN
INTRAMUSCULAR | Status: AC
  Administered 2016-06-08: 13:00:00
  Filled 2016-06-08: qty 2

## 2016-06-08 MED ORDER — SODIUM CHLORIDE 0.9 % IV SOLN
INTRAVENOUS | Status: DC
  Administered 2016-06-08 – 2016-06-10 (×2): via INTRAVENOUS

## 2016-06-08 MED ORDER — FENTANYL 2500MCG IN NS 250ML (10MCG/ML) PREMIX INFUSION
25.0000 ug/h | INTRAVENOUS | Status: DC
  Administered 2016-06-08 – 2016-06-11 (×2): 50 ug/h via INTRAVENOUS
  Filled 2016-06-08 (×2): qty 250

## 2016-06-08 MED ORDER — FENTANYL CITRATE (PF) 100 MCG/2ML IJ SOLN: 50.0000 ug | Freq: Once | INTRAMUSCULAR | Status: DC

## 2016-06-08 MED ORDER — FENTANYL BOLUS VIA INFUSION
50.0000 ug | INTRAVENOUS | Status: DC | PRN
  Administered 2016-06-08 – 2016-06-11 (×2): 50 ug via INTRAVENOUS
  Filled 2016-06-08: qty 50

## 2016-06-08 MED ORDER — FENTANYL CITRATE (PF) 100 MCG/2ML IJ SOLN
25.0000 ug | Freq: Once | INTRAMUSCULAR | Status: AC
  Administered 2016-06-08: 25 ug via INTRAVENOUS
  Filled 2016-06-08: qty 2

## 2016-06-08 MED ORDER — PRO-STAT SUGAR FREE PO LIQD
30.0000 mL | Freq: Two times a day (BID) | ORAL | Status: DC
  Administered 2016-06-08 – 2016-06-09 (×2): 30 mL
  Filled 2016-06-08 (×2): qty 30

## 2016-06-08 MED ORDER — FENTANYL CITRATE (PF) 100 MCG/2ML IJ SOLN: 12.5000 ug | Freq: Once | INTRAMUSCULAR | Status: DC

## 2016-06-08 MED ORDER — VITAL HIGH PROTEIN PO LIQD
1000.0000 mL | ORAL | Status: DC
  Administered 2016-06-08: 1000 mL

## 2016-06-08 NOTE — Progress Notes (Signed)
PHARMACY - PHYSICIAN COMMUNICATION CRITICAL VALUE ALERT - BLOOD CULTURE IDENTIFICATION (BCID)  No results found for this or any previous visit.   Lab called with GPC pairs/chains in 1/2 blood cultures. BCID with no ID.  Name of physician (or Provider) Contacted: Dr. Lyndel Safe  Changes to prescribed antibiotics required: None - pt on Zosyn and Vancomycin  Sherlon Handing, PharmD, BCPS Clinical pharmacist, pager [phone] 06/08/2016  12:25 AM

## 2016-06-08 NOTE — Progress Notes (Deleted)
Patient transported form 2M15 to CT and back without any complications.

## 2016-06-08 NOTE — Progress Notes (Signed)
Pt  biting tube while weaning--- HR in 160s SVT and Respirations in high 30s. MD notified and 2 mg versed given IVP with good effect. Will continue to monitor.

## 2016-06-08 NOTE — Progress Notes (Signed)
PT Cancellation Note  Patient Details Name: [name] MRN: [id number] DOB: [birth year]   Cancelled Treatment:    Reason Eval/Treat Not Completed: Patient not medically ready, per nsg notes, patient attempting to wean, had issues with elevated HR, versed given, will attempt evaluation next day if medically appropriate.   Duncan Dull 06/08/2016, 3:33 PM

## 2016-06-08 NOTE — Progress Notes (Addendum)
PULMONARY  / CRITICAL CARE MEDICINE  Name: [name] MRN: [id number] DOB: 1960-08-30    LOS: 2  REFERRING MD :  Dr. Winfred Leeds  CHIEF COMPLAINT:  SOB  BRIEF PATIENT DESCRIPTION: 25M w/ PMHx of CHF, hemidiaphragm s/p paraganglioma resection, CKD, CAD, ICD, and hypothyroidism who presents w/ SOB who was initially tx for CHF exacerbation in the ED and then worked up for sepsis after developing temp of 104.37F while in the ED.   Lines / Drains: ETT>> 4/2>>>4/3  Cultures: Blood cultures x 2 >>4/2  Antibiotics: Vanc 4/2>>>4/4 Zosyn 4/2>>>  Tests / Events: CXR 4/2 >> mild vascular congestion, stable cardiomegaly CXR 4/4 >> improvement in bibasilar PNA/atelectasis  Subjective: Pct trending up while on vanc and zosyn.   VITAL SIGNS: Temp:  [99.7 F (37.6 C)-100.8 F (38.2 C)] 100.2 F (37.9 C) (04/04 0337) Pulse Rate:  [42-133] 86 (04/04 0600) Resp:  [19-36] 22 (04/04 0600) BP: (80-121)/(70-106) 111/88 (04/04 0600) SpO2:  [79 %-100 %] 98 % (04/04 0600) FiO2 (%):  [40 %] 40 % (04/04 0308)   HEMODYNAMICS:   VENTILATOR SETTINGS: Vent Mode: PRVC FiO2 (%):  [40 %] 40 % Set Rate:  [22 bmp] 22 bmp Vt Set:  [510 mL] 510 mL PEEP:  [5 cmH20] 5 cmH20 Plateau Pressure:  [19 cmH20-34 cmH20] 19 cmH20  INTAKE / OUTPUT: Intake/Output      04/03 0701 - 04/04 0700 04/04 0701 - 04/05 0700   Other 80    NG/GT 30    IV Piggyback 375    Total Intake(mL/kg) 485 (4.4)    Urine (mL/kg/hr) 3565 (1.3)    Total Output 3565     Net -3080           PHYSICAL EXAMINATION: General:  NAD, alert and interactive Neuro:  RASS 0, moving all ext to command HEENT:  [place]/AT, PERRL, EOM-I and MMM Cardiovascular:  RRR, no murmurs Lungs:  Decreased BS on the right Abdomen:  Soft, non tender, non distended Skin:  Warm and dry, ICD in left chest wall non tender w/o any fluctuance or erythema.   LABS: Cbc  Recent Labs Lab 06/06/16 1925 06/07/16 0057 06/08/16 0431  WBC 12.4* 9.5 11.2*   HGB 13.6 13.3 14.8  HCT 41.3 40.0 44.3  PLT 155 161 172   Chemistry  Recent Labs Lab 06/06/16 1925 06/07/16 0057 06/08/16 0431  NA 138 139 141  K 4.1 3.7 4.1  CL 101 98* 100*  CO2 25 27 27   BUN 47* 48* 50*  CREATININE 2.54* 2.84* 3.66*  CALCIUM 8.6* 8.9 9.1  MG  --   --  2.1  PHOS  --   --  4.1  GLUCOSE 89 94 89   Liver fxn  Recent Labs Lab 06/06/16 1816 06/06/16 1925  AST 76* 64*  ALT 51 46  ALKPHOS 108 90  BILITOT 1.4* 1.8*  PROT 6.9 6.1*  ALBUMIN 3.3* 3.0*   coags  Recent Labs Lab 06/06/16 1925  APTT 35  INR 1.22   Sepsis markers  Recent Labs Lab 06/06/16 1650 06/06/16 1925 06/06/16 2116 06/07/16 0057 06/08/16 0431  LATICACIDVEN 5.50* 1.8 1.8  --   --   PROCALCITON  --  1.11  --  5.98 12.28   Cardiac markers  Recent Labs Lab 06/07/16 0057 06/07/16 0748 06/07/16 1416  TROPONINI 0.14* 0.12* 0.17*   BNP No results for input(s): PROBNP in the last 168 hours. ABG  Recent Labs Lab 06/06/16 1650 06/06/16 1800 06/06/16 1940 06/08/16  0350  PHART  --  7.349* 7.467* 7.515*  PCO2ART  --  61.0* 39.4 33.6  PO2ART  --  414.0* 134.0* 129*  HCO3  --  32.5* 27.7 26.9  TCO2 33 34 29  --    CBG trend  Recent Labs Lab 06/07/16 1156 06/07/16 1547 06/07/16 2001 06/07/16 2341 06/08/16 0336  GLUCAP 88 89 91 82 85   IMAGING:  ECG:  DIAGNOSES: Active Problems:   Acute respiratory failure with hypoxemia (HCC)   Pressure injury of skin  ASSESSMENT / PLAN:  PULMONARY A: VDRF due to pulmonary edema and concern for bronchitis. History of a paralyzed hemidiaphragm History of Paraganglioma P:   - Extubate today - IS per RT protocol - Titrate O2 for sat of 88-92% - ?SLP, will re-evaluate post extubation  CARDIOVASCULAR A:  EF 10% Hypertension P:  - Blood pressures improved - Net negative  - HF following.   RENAL A:   AKI Hyperkalemia P:   - KVO IVF - Hyperkalemia resolved s/p Kayexalate. - Replace electrolytes as  indicated. - Hold further lasix at this time  GASTROINTESTINAL A:   No active issues P:   - SLP if questionable - Heart healthy diet  HEMATOLOGIC A:   No active issues P:  - CBC in AM - Transfuse per ICU protocol  INFECTIOUS A:   Febrile-- PNA  P:   - D/C Vanc - Zosyn - PCT 5.98>12.28 - Blood culture 4/3 with 1/2 growing GPC, likely contaminate.   ENDOCRINE A:   Hyperglycemia   P:   - ISS - CBGs  NEUROLOGIC A:   Sedated but prior to that neurologic exam was normal. P:   - D/C Propofol (on hold for now) - D/C PRN fentanyl  The patient is critically ill with multiple organ systems failure and requires high complexity decision making for assessment and support, frequent evaluation and titration of therapies, application of advanced monitoring technologies and extensive interpretation of multiple databases.   Critical Care Time devoted to patient care services described in this note is  35  Minutes. This time reflects time of care of this signee Dr Jennet Maduro. This critical care time does not reflect procedure time, or teaching time or supervisory time of PA/NP/Med student/Med Resident etc but could involve care discussion time.  Rush Farmer, M.D. [facility] Pulmonary/Critical Care Medicine. Pager: [phone]. After hours pager: [phone].  06/08/2016, 7:36 AM

## 2016-06-09 ENCOUNTER — Other Ambulatory Visit (HOSPITAL_COMMUNITY): Payer: 59

## 2016-06-09 ENCOUNTER — Inpatient Hospital Stay (HOSPITAL_COMMUNITY): Payer: 59

## 2016-06-09 DIAGNOSIS — N179 Acute kidney failure, unspecified: Secondary | ICD-10-CM

## 2016-06-09 DIAGNOSIS — I509 Heart failure, unspecified: Secondary | ICD-10-CM

## 2016-06-09 LAB — DIFFERENTIAL
BASOS ABS: 0 10*3/uL (ref 0.0–0.1)
BASOS PCT: 0 %
EOS ABS: 0.2 10*3/uL (ref 0.0–0.7)
EOS PCT: 2 %
Lymphocytes Relative: 10 %
Lymphs Abs: 1.4 10*3/uL (ref 0.7–4.0)
Monocytes Absolute: 1.5 10*3/uL — ABNORMAL HIGH (ref 0.1–1.0)
Monocytes Relative: 11 %
NEUTROS PCT: 77 %
Neutro Abs: 10.2 10*3/uL — ABNORMAL HIGH (ref 1.7–7.7)

## 2016-06-09 LAB — CBC
HEMATOCRIT: 44.8 % (ref 39.0–52.0)
Hemoglobin: 14.6 g/dL (ref 13.0–17.0)
MCH: 28.9 pg (ref 26.0–34.0)
MCHC: 32.6 g/dL (ref 30.0–36.0)
MCV: 88.5 fL (ref 78.0–100.0)
Platelets: 153 10*3/uL (ref 150–400)
RBC: 5.06 MIL/uL (ref 4.22–5.81)
RDW: 15.2 % (ref 11.5–15.5)
WBC: 13.9 10*3/uL — ABNORMAL HIGH (ref 4.0–10.5)

## 2016-06-09 LAB — GLUCOSE, CAPILLARY
GLUCOSE-CAPILLARY: 112 mg/dL — AB (ref 65–99)
GLUCOSE-CAPILLARY: 115 mg/dL — AB (ref 65–99)
GLUCOSE-CAPILLARY: 130 mg/dL — AB (ref 65–99)
Glucose-Capillary: 117 mg/dL — ABNORMAL HIGH (ref 65–99)
Glucose-Capillary: 197 mg/dL — ABNORMAL HIGH (ref 65–99)
Glucose-Capillary: 262 mg/dL — ABNORMAL HIGH (ref 65–99)
Glucose-Capillary: 121 mg/dL — ABNORMAL HIGH (ref 65–99)
Glucose-Capillary: 135 mg/dL — ABNORMAL HIGH (ref 65–99)

## 2016-06-09 LAB — BASIC METABOLIC PANEL
Anion gap: 14 (ref 5–15)
BUN: 58 mg/dL — AB (ref 6–20)
CALCIUM: 9 mg/dL (ref 8.9–10.3)
CO2: 27 mmol/L (ref 22–32)
Chloride: 101 mmol/L (ref 101–111)
Creatinine, Ser: 3.74 mg/dL — ABNORMAL HIGH (ref 0.61–1.24)
GFR calc Af Amer: 19 mL/min — ABNORMAL LOW (ref >60)
GFR, EST NON AFRICAN AMERICAN: 17 mL/min — AB (ref >60)
Glucose, Bld: 123 mg/dL — ABNORMAL HIGH (ref 65–99)
Potassium: 3.9 mmol/L (ref 3.5–5.1)
Sodium: 142 mmol/L (ref 135–145)

## 2016-06-09 LAB — PHOSPHORUS
PHOSPHORUS: 3.7 mg/dL (ref 2.5–4.6)
Phosphorus: 2.8 mg/dL (ref 2.5–4.6)

## 2016-06-09 LAB — MAGNESIUM
Magnesium: 2.3 mg/dL (ref 1.7–2.4)
Magnesium: 2.3 mg/dL (ref 1.7–2.4)

## 2016-06-09 MED ORDER — VITAL HIGH PROTEIN PO LIQD
1000.0000 mL | ORAL | Status: DC
  Administered 2016-06-09 – 2016-06-16 (×11): 1000 mL

## 2016-06-09 MED ORDER — FENTANYL CITRATE (PF) 100 MCG/2ML IJ SOLN
INTRAMUSCULAR | Status: AC
  Administered 2016-06-09: 100 ug
  Filled 2016-06-09: qty 2

## 2016-06-09 MED ORDER — PNEUMOCOCCAL VAC POLYVALENT 25 MCG/0.5ML IJ INJ
0.5000 mL | INJECTION | INTRAMUSCULAR | Status: DC
  Filled 2016-06-09: qty 0.5

## 2016-06-09 MED ORDER — SODIUM CHLORIDE 0.9 % IV SOLN
INTRAVENOUS | Status: DC
  Administered 2016-06-10: 12:00:00 via INTRAVENOUS

## 2016-06-09 MED ORDER — MIDAZOLAM HCL 2 MG/2ML IJ SOLN
INTRAMUSCULAR | Status: AC
  Administered 2016-06-09: 2 mg
  Filled 2016-06-09: qty 4

## 2016-06-09 MED ORDER — FUROSEMIDE 10 MG/ML IJ SOLN
40.0000 mg | Freq: Two times a day (BID) | INTRAMUSCULAR | Status: DC
  Administered 2016-06-09 – 2016-06-10 (×2): 40 mg via INTRAVENOUS
  Filled 2016-06-09 (×3): qty 4

## 2016-06-09 MED ORDER — DEXTROSE 5 % IV SOLN
2.0000 g | INTRAVENOUS | Status: AC
  Administered 2016-06-09 – 2016-06-21 (×14): 2 g via INTRAVENOUS
  Filled 2016-06-09 (×15): qty 2

## 2016-06-09 MED ORDER — ETOMIDATE 2 MG/ML IV SOLN
20.0000 mg | Freq: Once | INTRAVENOUS | Status: AC
  Administered 2016-06-09: 20 mg via INTRAVENOUS

## 2016-06-09 MED ORDER — DEXTROSE 5 % IV SOLN
500.0000 mg | INTRAVENOUS | Status: AC
  Administered 2016-06-09 – 2016-06-13 (×5): 500 mg via INTRAVENOUS
  Filled 2016-06-09 (×5): qty 500

## 2016-06-09 MED ORDER — ROCURONIUM BROMIDE 50 MG/5ML IV SOLN
50.0000 mg | Freq: Once | INTRAVENOUS | Status: AC
  Administered 2016-06-09: 50 mg via INTRAVENOUS
  Filled 2016-06-09: qty 5

## 2016-06-09 NOTE — Procedures (Signed)
Intubation Procedure Note [name] [id number] Oct 02, 1960  Procedure: Intubation Indications: Respiratory insufficiency  Procedure Details Consent: Unable to obtain consent because of emergent medical necessity. Time Out: Verified patient identification, verified procedure, site/side was marked, verified correct patient position, special equipment/implants available, medications/allergies/relevent history reviewed, required imaging and test results available.  Performed  Maximum sterile technique was used including gloves, hand hygiene and mask.  4 Glidescope; airway was well visualized and ETT tube placed under direct visualization. Colormetric change was noted on CO2 monitor.  Medications used: 50mg  rocuronium; 20mg  etomidate; versed 2mg ; fentanyl 166mcg  Evaluation Hemodynamic Status: BP stable throughout; O2 sats: stable throughout Patient's Current Condition: stable Complications: No apparent complications Patient did tolerate procedure well. Chest X-ray ordered to verify placement.  CXR: pending.  Procedure was completed under direct supervision of my attending, Dr. Nelda Marseille.   [name] 06/09/2016  I was present and supervised the entire procedure.  Rush Farmer, M.D. [facility] Pulmonary/Critical Care Medicine. Pager: [phone]. After hours pager: [phone].

## 2016-06-09 NOTE — Progress Notes (Signed)
SUBJECTIVE: The patient extubated though immediately required re-intubation, is sedated.  Marland Kitchen azithromycin  500 mg Intravenous Q24H  . cefTRIAXone (ROCEPHIN)  IV  2 g Intravenous Q24H  . chlorhexidine gluconate (MEDLINE KIT)  15 mL Mouth Rinse BID  . feeding supplement (PRO-STAT SUGAR FREE 64)  30 mL Per Tube BID  . feeding supplement (VITAL HIGH PROTEIN)  1,000 mL Per Tube Q24H  . fentaNYL (SUBLIMAZE) injection  50 mcg Intravenous Once  . heparin  5,000 Units Subcutaneous Q8H  . insulin aspart  2-6 Units Subcutaneous Q4H  . mouth rinse  15 mL Mouth Rinse QID  . pantoprazole (PROTONIX) IV  40 mg Intravenous Q24H  . tobramycin-dexamethasone   Right Eye BID   . sodium chloride 10 mL/hr at 06/08/16 0800  . fentaNYL infusion INTRAVENOUS Stopped (06/09/16 0947)    OBJECTIVE: Physical Exam: Vitals:   06/09/16 0700 06/09/16 0817 06/09/16 0819 06/09/16 0900  BP: 99/77   112/80  Pulse: 84 86  89  Resp: (!) 22 (!) 22  (!) 22  Temp:   98.5 F (36.9 C)   TempSrc:   Oral   SpO2: 100% 100%  98%  Weight:      Height:        Intake/Output Summary (Last 24 hours) at 06/09/16 1042 Last data filed at 06/09/16 1000  Gross per 24 hour  Intake          1100.84 ml  Output              750 ml  Net           350.84 ml    Telemetryis reviewed by myself: SR, periods of tachycardia, suspect is sinus  GEN- The patient intubated and sedated   Head- normocephalic, atraumatic Eyes-  Sclera clear, conjunctiva pink Ears- unable to assess Oropharynx- intubated Neck- no JVP Lungs- scattered ronchi Heart- RRR, tachycardic, no significant murmurs, no rubs or gallops GI- soft, ND Extremities- no clubbing, cyanosis, no edema Skin- no rash or lesion Psych- unable to assess Neuro- unable to assess  LABS: Basic Metabolic Panel:  Recent Labs  06/08/16 0431 06/08/16 1633 06/09/16 0534  NA 141  --  142  K 4.1  --  3.9  CL 100*  --  101  CO2 27  --  27  GLUCOSE 89  --  123*  BUN 50*  --   58*  CREATININE 3.66*  --  3.74*  CALCIUM 9.1  --  9.0  MG 2.1 2.3 2.3  PHOS 4.1 5.3* 3.7   Liver Function Tests:  Recent Labs  06/06/16 1816 06/06/16 1925  AST 76* 64*  ALT 51 46  ALKPHOS 108 90  BILITOT 1.4* 1.8*  PROT 6.9 6.1*  ALBUMIN 3.3* 3.0*   CBC:  Recent Labs  06/06/16 1644  06/08/16 0431 06/09/16 0534  WBC 9.5  < > 11.2* 13.9*  NEUTROABS 5.5  --   --   --   HGB 16.7  < > 14.8 14.6  HCT 50.4  < > 44.3 44.8  MCV 90.3  < > 87.4 88.5  PLT 232  < > 172 153  < > = values in this interval not displayed. Cardiac Enzymes:  Recent Labs  06/07/16 0057 06/07/16 0748 06/07/16 1416  TROPONINI 0.14* 0.12* 0.17*   Fasting Lipid Panel:  Recent Labs  06/08/16 0431  TRIG 115    RADIOLOGY: Dg Chest Port 1 View Result Date: 06/09/2016 CLINICAL DATA:  Daily check for  Vent and CVL patient. EXAM: PORTABLE CHEST 1 VIEW COMPARISON:  06/08/2016 FINDINGS: Patient's endotracheal tube, tip 2.4 cm above the carina. A nasogastric tube is in place with tip beyond the gastroesophageal junction on the film. I such as his pacemaker leads overlie the right ventricle and right atrium. Heart is enlarged. There is mild bibasilar airspace filling. There has been some improvement in aeration of the left lung base with the left hemidiaphragm more apparent. Multiple old rib fractures. IMPRESSION: Slight improvement in aeration of left lung base. Persistent bibasilar airspace filling. Electronically Signed   By: Nolon Nations M.D.   On: 06/09/2016 08:55      ASSESSMENT AND PLAN:    1. Respiratory failure      immediately decompensated upon extubation and reintubated      Fever 104 in the ER      ? Pneumonia, pulmonary edema, bronchitis      Paralyzed hemidiaphragm discussed briefly with CCM, suspect primary respiratory issue is the diaphragm, and infection, though giving a dose of IV lasix this AM, felt to have a vpoor prognosis overall/longterm  2. Febrile illness     Blood  cultures only 1 of 2 positive for gram + cocci, VIRIDANS STREPTOCOCCUS (2 days)     WBC 13.9     TMax in last 24 hours was early yesterday AM 100.2     Repeat cultures drawn  3. Hx of NICM CHF, by last echo EF 10% w/severe MR     Creat is up      Diuresis held     cumulatively negative -[id number]     Bed weight down one pound     c/w CCM team      4. CRT-ICD      Follow      Pending final BC results to decide regarding need or not for device extraction  5. Tachycardia      130's, suspect sinus tach/paced      Will have device interrogated given morphology change, possibly loss of paced morphology  His ICD was interrogated, no VT noted here.    Tommye Standard, PA-C 06/09/2016 10:42 AM

## 2016-06-09 NOTE — Progress Notes (Signed)
Initial Nutrition Assessment  DOCUMENTATION CODES:   Obesity unspecified  INTERVENTION:    Vital High Protein at 60 ml/h (1440 ml per day)   Provides 1440 kcal, 126 gm protein, 1204 ml free water daily  NUTRITION DIAGNOSIS:   Inadequate oral intake related to inability to eat as evidenced by NPO status.  GOAL:   Provide needs based on ASPEN/SCCM guidelines  MONITOR:   Vent status, TF tolerance, Skin, Labs, I & O's  REASON FOR ASSESSMENT:   Consult Enteral/tube feeding initiation and management  ASSESSMENT:   56 yo male w/ PMHx of CHF, CKD, CAD, ICD, and hypothyroidism who presented w/ SOB on 4/2. He was initially tx for CHF exacerbation in the ED and then worked up for sepsis after developing temp of 104.4F while in the ED. Required intubation.  Patient was extubated this morning, but quickly required re-intubation. Received MD Consult for TF initiation and management. Nutrition-Focused physical exam completed. Findings are no fat depletion, no muscle depletion, and mild edema.  Patient is currently intubated on ventilator support MV: 11 L/min Temp (24hrs), Avg:98.6 F (37 C), Min:98 F (36.7 C), Max:100.1 F (37.8 C)  Labs reviewed. CBG's: 112-115-197-262 Medications reviewed and include Lasix.   Diet Order:  Diet NPO time specified  Skin:  Wound (see comment) (stage II to back)  Last BM:  PTA  Height:   Ht Readings from Last 1 Encounters:  06/06/16 5\' 6"  (1.676 m)    Weight:   Wt Readings from Last 1 Encounters:  06/09/16 244 lb 14.9 oz (111.1 kg)    Ideal Body Weight:  64.5 kg  BMI:  Body mass index is 39.53 kg/m.  Estimated Nutritional Needs:   Kcal:  1031-5945  Protein:  129 gm  Fluid:  2 L  EDUCATION NEEDS:   No education needs identified at this time  Molli Barrows, [place], [place], [place] Pager [phone] After Hours Pager [phone]

## 2016-06-09 NOTE — Care Management Note (Addendum)
Case Management Note  Patient Details  Name: [name] MRN: [id number] Date of Birth: 1960/04/23  Subjective/Objective:   Pt admitted with SOB                 Action/Plan: Reportedly from home.   Pt intubated/extubated and then unfortunately reintubated.  CM will continue to follow for discharge needs  Addendum 4/10 Pt OOBTC w PT. rec CIR/ LTAC at this time. CM will continue to follow PT rec as patient progresses. Patient remains ventilated through ETT, failed wean yesterday. 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    Expected Discharge Date:                  Expected Discharge Plan:     In-House Referral:  Clinical Social Work  Discharge planning Services     Post Acute Care Choice:    Choice offered to:     DME Arranged:    DME Agency:     HH Arranged:    [place] Agency:     Status of Service:  In process, will continue to follow  If discussed at Long Length of Stay Meetings, dates discussed:    Additional Comments:  Maryclare Labrador, RN 06/09/2016, 4:04 PM

## 2016-06-09 NOTE — Evaluation (Signed)
Physical Therapy Evaluation Patient Details Name: [name] MRN: [id number] DOB: 27-Mar-1960 Today's Date: 06/09/2016   History of Present Illness  16M w/ PMHx of CHF, gout, cardiomyopathy, hemidiaphragm s/p paraganglioma resection, CKD, CAD, ICD, and hypothyroidism who presents w/ SOB who was initially tx for CHF exacerbation in the ED and then worked up for sepsis after developing temp of 104.2F while in the ED.  Clinical Impression  Pt admitted with above diagnosis. Pt currently with functional limitations due to the deficits listed below (see PT Problem List). Pt was able to sit EOB while on vent, perform some LE exercises and stand at EOB and take a few steps to [facility].  Pt should do well.  VSS as below.  Will follow acutely.  Pt will benefit from skilled PT to increase their independence and safety with mobility to allow discharge to the venue listed below.     Follow Up Recommendations Home health PT;Supervision/Assistance - 24 hour    Equipment Recommendations  Rolling walker with 5" wheels;3in1 (PT)    Recommendations for Other Services       Precautions / Restrictions Precautions Precautions: Fall Restrictions Weight Bearing Restrictions: No      Mobility  Bed Mobility Overal bed mobility: Needs Assistance Bed Mobility: Supine to Sit;Sit to Supine     Supine to sit: Mod assist;+2 for physical assistance Sit to supine: Mod assist;+2 for physical assistance   General bed mobility comments: Pt needed assist for LEs and elevation of trunk.   Transfers Overall transfer level: Needs assistance Equipment used: 2 person hand held assist Transfers: Sit to/from Stand Sit to Stand: Mod assist;+2 physical assistance         General transfer comment: Pt was able to stand at EOB with +2 HHA.  Did not stand fully upright possibly due to tubes.  Pt took two side steps to [facility] with +2 HHA min to mod assist.  Assist to control descent to bed.  Ambulation/Gait                 Stairs            Wheelchair Mobility    Modified Rankin (Stroke Patients Only)       Balance Overall balance assessment: Needs assistance;History of Falls Sitting-balance support: No upper extremity supported;Feet supported Sitting balance-Leahy Scale: Fair Sitting balance - Comments: can sit statically without UE support for up to a minute   Standing balance support: Bilateral upper extremity supported;During functional activity Standing balance-Leahy Scale: Poor Standing balance comment: requires +2 min to mod assist to stand statically.                              Pertinent Vitals/Pain Pain Assessment: 0-10 Pain Score: 6  Pain Location: right side Pain Descriptors / Indicators: Aching;Grimacing;Guarding Pain Intervention(s): Limited activity within patient's tolerance;Monitored during session;Premedicated before session;Repositioned  HR 86-98 bpm, 99% O2 sats, BP 112/80 initially and 120/82 after settled back in bed.  Vent settings are 40% FiO2, PEEP 5, with RR 22-26.      Home Living Family/patient expects to be discharged to:: Private residence Living Arrangements: Spouse/significant other;Children Available Help at Discharge: Family;Available 24 hours/day Type of Home: House Home Access: Stairs to enter Entrance Stairs-Rails: None Entrance Stairs-Number of Steps: 2 Home Layout: Two level;1/2 bath on main level;Able to live on main level with bedroom/bathroom Home Equipment: Cane - quad;Shower seat - built in Additional Comments: Per pt,  child is in a wheelchair.  They have to assist child with B/D but once they get child in wheelchair he is Modif I in the home and they both work.     Prior Function Level of Independence: Independent               Hand Dominance   Dominant Hand: Right    Extremity/Trunk Assessment   Upper Extremity Assessment Upper Extremity Assessment: Defer to OT evaluation    Lower Extremity  Assessment Lower Extremity Assessment: Overall WFL for tasks assessed    Cervical / Trunk Assessment Cervical / Trunk Assessment: Normal  Communication   Communication: Other (comment) (Intubated)  Cognition Arousal/Alertness: Awake/alert Behavior During Therapy: Anxious Overall Cognitive Status: Within Functional Limits for tasks assessed                                        General Comments      Exercises General Exercises - Lower Extremity Long Arc Quad: AROM;Both;5 reps;Seated Hip Flexion/Marching: AROM;Both;5 reps;Seated   Assessment/Plan    PT Assessment Patient needs continued PT services  PT Problem List Decreased activity tolerance;Decreased balance;Decreased mobility;Decreased knowledge of use of DME;Decreased safety awareness;Decreased knowledge of precautions;Cardiopulmonary status limiting activity;Pain       PT Treatment Interventions DME instruction;Gait training;Stair training;Functional mobility training;Therapeutic activities;Balance training;Therapeutic exercise;Patient/family education    PT Goals (Current goals can be found in the Care Plan section)  Acute Rehab PT Goals Patient Stated Goal: to go home PT Goal Formulation: With patient Time For Goal Achievement: 06/23/16 Potential to Achieve Goals: Good    Frequency Min 3X/week   Barriers to discharge        Co-evaluation               End of Session Equipment Utilized During Treatment: Gait belt;Oxygen (ventilator) Activity Tolerance: Patient limited by fatigue Patient left: in bed;with call bell/phone within reach;with SCD's reapplied;with bed alarm set (Bed in chair position) Nurse Communication: Mobility status;Need for lift equipment PT Visit Diagnosis: Unsteadiness on feet (R26.81);Muscle weakness (generalized) (M62.81)    Time: 3536-1443 PT Time Calculation (min) (ACUTE ONLY): 24 min   Charges:   PT Evaluation $PT Eval Moderate Complexity: 1 Procedure PT  Treatments $Therapeutic Activity: 8-22 mins   PT G Codes:        Eliyohu Class,PT Acute Rehabilitation [phone] (pager)   Denice Paradise 06/09/2016, 10:04 AM

## 2016-06-09 NOTE — Progress Notes (Signed)
PULMONARY  / CRITICAL CARE MEDICINE  Name: [name] MRN: [id number] DOB: Apr 04, 1960    LOS: 20  REFERRING MD :  Dr. Winfred Leeds  CHIEF COMPLAINT:  SOB  BRIEF PATIENT DESCRIPTION: 63M w/ PMHx of CHF, hemidiaphragm s/p paraganglioma resection, CKD, CAD, ICD, and hypothyroidism who presents w/ SOB who was initially tx for CHF exacerbation in the ED and then worked up for sepsis after developing temp of 104.51F while in the ED.   Lines / Drains: ETT>> 4/2  Cultures: Blood cultures x 2 >>4/2-- 1/2 strep viridans  Blood culture >> 4/5  Antibiotics: Vanc >>4/2-4/3 Zosyn >>4/2  Tests / Events: CXR 4/2 >> mild vascular congestion, stable cardiomegaly  Subjective: alert, able to write down that his rt sided pain has improved and is from pressure.   VITAL SIGNS: Temp:  [98 F (36.7 C)-98.9 F (37.2 C)] 98.5 F (36.9 C) (04/05 0819) Pulse Rate:  [41-161] 89 (04/05 0900) Resp:  [20-39] 22 (04/05 0900) BP: (91-148)/(73-118) 112/80 (04/05 0900) SpO2:  [89 %-100 %] 98 % (04/05 0900) FiO2 (%):  [40 %] 40 % (04/05 1000) Weight:  [111.1 kg (244 lb 14.9 oz)] 111.1 kg (244 lb 14.9 oz) (04/05 0346)   HEMODYNAMICS:   VENTILATOR SETTINGS: Vent Mode: PRVC FiO2 (%):  [40 %] 40 % Set Rate:  [22 bmp] 22 bmp Vt Set:  [510 mL] 510 mL PEEP:  [5 cmH20] 5 cmH20 Pressure Support:  [5 cmH20] 5 cmH20 Plateau Pressure:  [20 cmH20-22 cmH20] 20 cmH20  INTAKE / OUTPUT: Intake/Output      04/04 0701 - 04/05 0700 04/05 0701 - 04/06 0700   I.V. (mL/kg) 260.7 (2.3) 22 (0.2)   Other     NG/GT 600 120   IV Piggyback 112.5 25   Total Intake(mL/kg) 973.2 (8.8) 167 (1.5)   Urine (mL/kg/hr) 450 (0.2) 300 (0.8)   Total Output 450 300   Net +523.2 -133        Urine Occurrence 2 x     PHYSICAL EXAMINATION: General:  NAD, alert and interactive Neuro:  RASS 0, moving all extremities  HEENT:  /AT, PERRL, EOM-I and MMM, ETT in place Cardiovascular:  RRR, no murmurs Lungs:  CTAB, no  wheezing Abdomen:  Soft, non tender, non distended Skin:  Warm and dry Ext: trace pedal edema  LABS: Cbc  Recent Labs Lab 06/07/16 0057 06/08/16 0431 06/09/16 0534  WBC 9.5 11.2* 13.9*  HGB 13.3 14.8 14.6  HCT 40.0 44.3 44.8  PLT 161 172 153   Chemistry  Recent Labs Lab 06/07/16 0057 06/08/16 0431 06/08/16 1633 06/09/16 0534  NA 139 141  --  142  K 3.7 4.1  --  3.9  CL 98* 100*  --  101  CO2 27 27  --  27  BUN 48* 50*  --  58*  CREATININE 2.84* 3.66*  --  3.74*  CALCIUM 8.9 9.1  --  9.0  MG  --  2.1 2.3 2.3  PHOS  --  4.1 5.3* 3.7  GLUCOSE 94 89  --  123*   Liver fxn  Recent Labs Lab 06/06/16 1816 06/06/16 1925  AST 76* 64*  ALT 51 46  ALKPHOS 108 90  BILITOT 1.4* 1.8*  PROT 6.9 6.1*  ALBUMIN 3.3* 3.0*   coags  Recent Labs Lab 06/06/16 1925  APTT 35  INR 1.22   Sepsis markers  Recent Labs Lab 06/06/16 1650 06/06/16 1925 06/06/16 2116 06/07/16 0057 06/08/16 0431  LATICACIDVEN 5.50* 1.8  1.8  --   --   PROCALCITON  --  1.11  --  5.98 12.28   Cardiac markers  Recent Labs Lab 06/07/16 0057 06/07/16 0748 06/07/16 1416  TROPONINI 0.14* 0.12* 0.17*   BNP No results for input(s): PROBNP in the last 168 hours. ABG  Recent Labs Lab 06/06/16 1650 06/06/16 1800 06/06/16 1940 06/08/16 0350  PHART  --  7.349* 7.467* 7.515*  PCO2ART  --  61.0* 39.4 33.6  PO2ART  --  414.0* 134.0* 129*  HCO3  --  32.5* 27.7 26.9  TCO2 33 34 29  --    CBG trend  Recent Labs Lab 06/08/16 1601 06/08/16 1937 06/08/16 2325 06/09/16 0337 06/09/16 0818  GLUCAP 93 96 117* 112* 115*   IMAGING:  ECG:  DIAGNOSES: Active Problems:   Acute respiratory failure with hypoxemia (HCC)   Pressure injury of skin  ASSESSMENT / PLAN:  PULMONARY A: VDRF due to pulmonary edema and concern for bronchitis. History of a paralyzed hemidiaphragm History of Paraganglioma P:   - Went into SVT yesterday when attempting to wean, will reassess again this  morning.  - Repeat CXR today. - Extubate today. - Titrate O2 for sat of 88-92%.  CARDIOVASCULAR A:  EF 10% Hypertension P:  - Blood pressures improved - Tele monitoring. - May need beta blockers  RENAL A:   AKI Hyperkalemia P:   - KVO IVF - Hyperkalemia resolved s/p Kayexalate. - Replace electrolytes as indicated. - Creatine stable from yesterday s/p diuresis   GASTROINTESTINAL A:   No active issues P:   - Continue TB  HEMATOLOGIC A:   No active issues P:  - CBC in AM - Transfuse per ICU protocol  INFECTIOUS A:   Febrile, unknown source P:   - Zosyn - Repeat Blood culture 4/5 - WBC elevated, will add diff to CBC  ENDOCRINE A:   Hyperglycemia   P:   - ISS - CBGs  NEUROLOGIC A:   Sedated but prior to that neurologic exam was normal. P:   RASS goal: 0 to -2 - D/C Propofol - D/C fentanyl  The patient is critically ill with multiple organ systems failure and requires high complexity decision making for assessment and support, frequent evaluation and titration of therapies, application of advanced monitoring technologies and extensive interpretation of multiple databases.   Critical Care Time devoted to patient care services described in this note is  35  Minutes. This time reflects time of care of this signee Dr Jennet Maduro. This critical care time does not reflect procedure time, or teaching time or supervisory time of PA/NP/Med student/Med Resident etc but could involve care discussion time.  Rush Farmer, M.D. [facility] Pulmonary/Critical Care Medicine. Pager: [phone]. After hours pager: [phone].  06/09/2016, 10:23 AM

## 2016-06-09 NOTE — Progress Notes (Signed)
  Echocardiogram 2D Echocardiogram has been performed.  [name] 06/09/2016, 5:55 PM

## 2016-06-09 NOTE — Progress Notes (Signed)
No ICM remote transmission received for 06/07/2016 due to hospitalization and next ICM transmission scheduled for 06/21/2016.

## 2016-06-09 NOTE — Progress Notes (Signed)
PULMONARY  / CRITICAL CARE MEDICINE  Name: [name] MRN: [id number] DOB: [birth year]    LOS: 4  REFERRING MD :  Dr. Winfred Leeds  CHIEF COMPLAINT:  SOB  BRIEF PATIENT DESCRIPTION: 52M w/ PMHx of CHF, hemidiaphragm s/p paraganglioma resection, CKD, CAD, ICD, and hypothyroidism who presents w/ SOB who was initially tx for CHF exacerbation in the ED and then worked up for sepsis after developing temp of 104.47F while in the ED.   Lines / Drains: ETT>> 4/2  Cultures: Blood cultures x 2 >>4/2-- 1/2 strep viridans  Blood culture >> 4/5  Antibiotics: Vanc >>4/2-4/3 Zosyn >>4/2  Tests / Events: CXR 4/2 >> mild vascular congestion, stable cardiomegaly  Subjective: alert, able to write down that his rt sided pain has improved and is from pressure.   VITAL SIGNS: Temp:  [98 F (36.7 C)-98.9 F (37.2 C)] 98.1 F (36.7 C) (04/05 0336) Pulse Rate:  [41-161] 84 (04/05 0700) Resp:  [20-39] 22 (04/05 0700) BP: (91-148)/(73-118) 99/77 (04/05 0700) SpO2:  [89 %-100 %] 100 % (04/05 0700) FiO2 (%):  [40 %] 40 % (04/05 0430) Weight:  [244 lb 14.9 oz (111.1 kg)] 244 lb 14.9 oz (111.1 kg) (04/05 0346)   HEMODYNAMICS:   VENTILATOR SETTINGS: Vent Mode: PRVC FiO2 (%):  [40 %] 40 % Set Rate:  [22 bmp] 22 bmp Vt Set:  [510 mL] 510 mL PEEP:  [5 cmH20] 5 cmH20 Pressure Support:  [5 cmH20] 5 cmH20 Plateau Pressure:  [20 cmH20-22 cmH20] 20 cmH20  INTAKE / OUTPUT: Intake/Output      04/04 0701 - 04/05 0700 04/05 0701 - 04/06 0700   I.V. (mL/kg) 260.7 (2.3)    Other     NG/GT 600    IV Piggyback 112.5    Total Intake(mL/kg) 973.2 (8.8)    Urine (mL/kg/hr) 450 (0.2)    Total Output 450     Net +523.2          Urine Occurrence 2 x     PHYSICAL EXAMINATION: General:  NAD, alert and interactive Neuro:  RASS 0, moving all extremities  HEENT:  [place]/AT, PERRL, EOM-I and MMM, ETT in place Cardiovascular:  RRR, no murmurs Lungs:  CTAB, no wheezing Abdomen:  Soft, non tender, non  distended Skin:  Warm and dry Ext: trace pedal edema  LABS: Cbc  Recent Labs Lab 06/07/16 0057 06/08/16 0431 06/09/16 0534  WBC 9.5 11.2* 13.9*  HGB 13.3 14.8 14.6  HCT 40.0 44.3 44.8  PLT 161 172 153   Chemistry  Recent Labs Lab 06/07/16 0057 06/08/16 0431 06/08/16 1633 06/09/16 0534  NA 139 141  --  142  K 3.7 4.1  --  3.9  CL 98* 100*  --  101  CO2 27 27  --  27  BUN 48* 50*  --  58*  CREATININE 2.84* 3.66*  --  3.74*  CALCIUM 8.9 9.1  --  9.0  MG  --  2.1 2.3 2.3  PHOS  --  4.1 5.3* 3.7  GLUCOSE 94 89  --  123*   Liver fxn  Recent Labs Lab 06/06/16 1816 06/06/16 1925  AST 76* 64*  ALT 51 46  ALKPHOS 108 90  BILITOT 1.4* 1.8*  PROT 6.9 6.1*  ALBUMIN 3.3* 3.0*   coags  Recent Labs Lab 06/06/16 1925  APTT 35  INR 1.22   Sepsis markers  Recent Labs Lab 06/06/16 1650 06/06/16 1925 06/06/16 2116 06/07/16 0057 06/08/16 0431  LATICACIDVEN 5.50* 1.8 1.8  --   --  PROCALCITON  --  1.11  --  5.98 12.28   Cardiac markers  Recent Labs Lab 06/07/16 0057 06/07/16 0748 06/07/16 1416  TROPONINI 0.14* 0.12* 0.17*   BNP No results for input(s): PROBNP in the last 168 hours. ABG  Recent Labs Lab 06/06/16 1650 06/06/16 1800 06/06/16 1940 06/08/16 0350  PHART  --  7.349* 7.467* 7.515*  PCO2ART  --  61.0* 39.4 33.6  PO2ART  --  414.0* 134.0* 129*  HCO3  --  32.5* 27.7 26.9  TCO2 33 34 29  --    CBG trend  Recent Labs Lab 06/08/16 1203 06/08/16 1601 06/08/16 1937 06/08/16 2325 06/09/16 0337  GLUCAP 99 93 96 117* 112*   IMAGING:  ECG:  DIAGNOSES: Active Problems:   Acute respiratory failure with hypoxemia (HCC)   Pressure injury of skin  ASSESSMENT / PLAN:  PULMONARY A: VDRF due to pulmonary edema and concern for bronchitis. History of a paralyzed hemidiaphragm History of Paraganglioma P:   - went into SVT yesterday when attempting to wean, will reassess again this morning.  - repeat CXR  today  CARDIOVASCULAR A:  EF 10% Hypertension P:  - blood pressures improved  RENAL A:   AKI Hyperkalemia P:   - KVO IVF - hyperkalemia resolved s/p Kayexalate. - Replace electrolytes as indicated. - creatine stable from yesterday s/p diuresis   GASTROINTESTINAL A:   No active issues P:   - continue TB  HEMATOLOGIC A:   No active issues P:  - CBC in AM - Transfuse per ICU protocol  INFECTIOUS A:   Febrile, unknown source P:   - Zosyn - repeat Blood culture 4/5 - WBC elevated, will add diff to CBC  ENDOCRINE A:   Hyperglycemia   P:   - ISS - CBGs  NEUROLOGIC A:   Sedated but prior to that neurologic exam was normal. P:   RASS goal: 0 to -2 - Propofol (on hold for now) - PRN fentanyl  [name], MD Internal Medicine Resident, PGY [facility] Pager: [phone]    06/09/2016, 8:01 AM

## 2016-06-09 NOTE — Progress Notes (Signed)
PT was extubated and reintubated due to failure to breath

## 2016-06-10 ENCOUNTER — Inpatient Hospital Stay (HOSPITAL_COMMUNITY): Payer: 59

## 2016-06-10 DIAGNOSIS — R6521 Severe sepsis with septic shock: Secondary | ICD-10-CM

## 2016-06-10 DIAGNOSIS — R000 Tachycardia, unspecified: Secondary | ICD-10-CM

## 2016-06-10 DIAGNOSIS — R570 Cardiogenic shock: Secondary | ICD-10-CM

## 2016-06-10 LAB — ECHOCARDIOGRAM COMPLETE
CHL CUP DOP CALC LVOT VTI: 7.91 cm
CHL CUP REG VEL DIAS: 243 cm/s
FS: 7 % — AB (ref 28–44)
HEIGHTINCHES: 66 in
IVS/LV PW RATIO, ED: 0.96
LA ID, A-P, ES: 43 mm
LA diam end sys: 43 mm
LA diam index: 1.85 cm/m2
LA vol A4C: 100 ml
LVOT SV: 36 mL
LVOT area: 4.52 cm2
LVOT diameter: 24 mm
LVOT peak vel: 61.3 cm/s
MRPISAEROA: 0.19 cm2
PV Reg grad dias: 24 mmHg
PW: 11.2 mm — AB (ref 0.6–1.1)
TAPSE: 12.7 mm
VTI: 120 cm
WEIGHTICAEL: 3918.9 [oz_av]

## 2016-06-10 LAB — GLUCOSE, CAPILLARY
GLUCOSE-CAPILLARY: 123 mg/dL — AB (ref 65–99)
GLUCOSE-CAPILLARY: 117 mg/dL — AB (ref 65–99)
GLUCOSE-CAPILLARY: 129 mg/dL — AB (ref 65–99)
GLUCOSE-CAPILLARY: 109 mg/dL — AB (ref 65–99)
Glucose-Capillary: 102 mg/dL — ABNORMAL HIGH (ref 65–99)
Glucose-Capillary: 120 mg/dL — ABNORMAL HIGH (ref 65–99)

## 2016-06-10 LAB — BASIC METABOLIC PANEL
ANION GAP: 15 (ref 5–15)
BUN: 70 mg/dL — ABNORMAL HIGH (ref 6–20)
CHLORIDE: 103 mmol/L (ref 101–111)
CO2: 26 mmol/L (ref 22–32)
Calcium: 8.7 mg/dL — ABNORMAL LOW (ref 8.9–10.3)
Creatinine, Ser: 3.37 mg/dL — ABNORMAL HIGH (ref 0.61–1.24)
GFR calc Af Amer: 22 mL/min — ABNORMAL LOW (ref >60)
GFR calc non Af Amer: 19 mL/min — ABNORMAL LOW (ref >60)
GLUCOSE: 135 mg/dL — AB (ref 65–99)
POTASSIUM: 3.3 mmol/L — AB (ref 3.5–5.1)
Sodium: 144 mmol/L (ref 135–145)

## 2016-06-10 LAB — URINALYSIS, ROUTINE W REFLEX MICROSCOPIC
Bilirubin Urine: NEGATIVE
GLUCOSE, UA: NEGATIVE mg/dL
HGB URINE DIPSTICK: NEGATIVE
KETONES UR: NEGATIVE mg/dL
Leukocytes, UA: NEGATIVE
Nitrite: NEGATIVE
PROTEIN: NEGATIVE mg/dL
Specific Gravity, Urine: 1.009 (ref 1.005–1.030)
pH: 8 (ref 5.0–8.0)

## 2016-06-10 LAB — CBC
HCT: 41.3 % (ref 39.0–52.0)
Hemoglobin: 13.9 g/dL (ref 13.0–17.0)
MCH: 29.4 pg (ref 26.0–34.0)
MCHC: 33.7 g/dL (ref 30.0–36.0)
MCV: 87.5 fL (ref 78.0–100.0)
PLATELETS: 154 10*3/uL (ref 150–400)
RBC: 4.72 MIL/uL (ref 4.22–5.81)
RDW: 14.4 % (ref 11.5–15.5)
WBC: 13.1 10*3/uL — ABNORMAL HIGH (ref 4.0–10.5)

## 2016-06-10 LAB — PHOSPHORUS: Phosphorus: 2.6 mg/dL (ref 2.5–4.6)

## 2016-06-10 LAB — MAGNESIUM: Magnesium: 2.2 mg/dL (ref 1.7–2.4)

## 2016-06-10 LAB — COOXEMETRY PANEL
CARBOXYHEMOGLOBIN: 0.9 % (ref 0.5–1.5)
Methemoglobin: 0.8 % (ref 0.0–1.5)
O2 SAT: 60.2 %
Total hemoglobin: 14.5 g/dL (ref 12.0–16.0)

## 2016-06-10 LAB — CREATININE, URINE, RANDOM: CREATININE, URINE: 45.34 mg/dL

## 2016-06-10 LAB — TSH: TSH: 1.589 u[IU]/mL (ref 0.350–4.500)

## 2016-06-10 LAB — SODIUM, URINE, RANDOM: SODIUM UR: 120 mmol/L

## 2016-06-10 MED ORDER — MIDAZOLAM HCL 2 MG/2ML IJ SOLN
1.0000 mg | INTRAMUSCULAR | Status: DC | PRN
  Administered 2016-06-10 – 2016-06-17 (×3): 1 mg via INTRAVENOUS
  Filled 2016-06-10 (×3): qty 2

## 2016-06-10 MED ORDER — SODIUM CHLORIDE 0.9% FLUSH: 10.0000 mL | INTRAVENOUS | Status: DC | PRN

## 2016-06-10 MED ORDER — POTASSIUM CHLORIDE 20 MEQ/15ML (10%) PO SOLN
40.0000 meq | Freq: Every day | ORAL | Status: DC
  Administered 2016-06-10: 40 meq via ORAL
  Filled 2016-06-10: qty 30

## 2016-06-10 MED ORDER — SODIUM CHLORIDE 0.9% FLUSH
10.0000 mL | Freq: Two times a day (BID) | INTRAVENOUS | Status: DC
  Administered 2016-06-10 – 2016-06-12 (×5): 10 mL
  Administered 2016-06-13: 20 mL
  Administered 2016-06-13 – 2016-06-14 (×2): 10 mL
  Administered 2016-06-14: 20 mL
  Administered 2016-06-15: 10 mL
  Administered 2016-06-16: 20 mL
  Administered 2016-06-16 – 2016-06-24 (×13): 10 mL

## 2016-06-10 MED ORDER — CHLORHEXIDINE GLUCONATE CLOTH 2 % EX PADS
6.0000 | MEDICATED_PAD | Freq: Every day | CUTANEOUS | Status: DC
  Administered 2016-06-11 – 2016-06-21 (×11): 6 via TOPICAL

## 2016-06-10 NOTE — Consult Note (Signed)
Advanced Heart Failure Team Consult Note   Primary Physician: Primary Cardiologist:  Dr Haroldine Laws EP: Dr Caryl Comes   Reason for Consultation: Heart Failure   HPI:   [name] is a 56 year old seen today for evaluation of heart failure at the request of Dr Elsworth Soho.     Mr Hattabaugh is a 56 year old with a PMH of obesity, gout, NICM, chronic systolic heart failure, CRT-D, bladder cancer, Paraganglioma resected 2015, h/o paralyzed diaphragm, hypothyroid, and OSA.   Admitted with increased shortness of breath and acute respiratory failure. In the ED O2 sats 83%. Placed on Bipap but continued to decline and required intubation. He had fever 104 so sepsis protocol was initiated Received IV fluids, cultures, and antibiotics. Lactic Acid 5.5. EP consulted on 4/3 for tachycardia. Device interrogated did not show VT.May need lead extraction.   Extubated on 4/5 but re-intubated due to respiratory insufficiency. CXR with vascular congestion.Started on 40 mg IV lasix twice a day. Sluggish diuresis. Today there is a question about the addition of inotropes due to worsening renal function. Creatinine continues to rise (creatinine 2.54>3.37) . Hypotensive overnight. WBC 13.1.     06/06/16 Blood CX- 1/2 Strep ---> Repeat blood CX on 06/09/16 NGTD 06/06/16  Urine CX - NGTD   ECHO 06/10/2016 EF 20%. Normal RV - Left ventricle: The cavity size was severely dilated. Wall   thickness was normal. The estimated ejection fraction was 20%.   Diffuse hypokinesis. - Aortic valve: There was mild regurgitation. - Mitral valve: There was moderate regurgitation. - Left atrium: The atrium was moderately dilated. - Atrial septum: No defect or patent foramen ovale was identified. Review of Systems: [y] = yes, _0  = no Unable to obtain as he is currently intubated.   General: Weight gain _1 ; Weight loss _2 ; Anorexia _3 ; Fatigue _4 ; Fever _5 ; Chills _6 ; Weakness _7   Cardiac: Chest pain/pressure _8 ; Resting SOB _9 ;  Exertional SOB _10 ; Orthopnea _11 ; Pedal Edema _12 ; Palpitations _13 ; Syncope _14 ; Presyncope _15 ; Paroxysmal nocturnal dyspnea_16   Pulmonary: Cough _17 ; Wheezing_18 ; Hemoptysis_19 ; Sputum _20 ; Snoring _21   GI: Vomiting_22 ; Dysphagia_23 ; Melena_24 ; Hematochezia _25 ; Heartburn_26 ; Abdominal pain _27 ; Constipation _28 ; Diarrhea _29 ; BRBPR _30   GU: Hematuria_31 ; Dysuria _32 ; Nocturia_33   Vascular: Pain in legs with walking _34 ; Pain in feet with lying flat _35 ; Non-healing sores _36 ; Stroke _37 ; TIA _38 ; Slurred speech _39 ;  Neuro: Headaches_40 ; Vertigo_41 ; Seizures_42 ; Paresthesias_43 ;Blurred vision _44 ; Diplopia _45 ; Vision changes _46   Ortho/Skin: Arthritis _47 ; Joint pain _48 ; Muscle pain _49 ; Joint swelling _50 ; Back Pain _51 ; Rash _52   Psych: Depression_53 ; Anxiety_54   Heme: Bleeding problems _55 ; Clotting disorders _56 ; Anemia _57   Endocrine: Diabetes _58 ; Thyroid dysfunction_59   Home Medications Prior to Admission medications   Medication Sig Start Date End Date Taking? Authorizing Provider  atorvastatin (LIPITOR) 40 MG tablet TAKE 1 TABLET BY MOUTH AT BEDTIME 09/03/15  Yes Shaune Pascal Elaya Droege, MD  BREO ELLIPTA 100-25 MCG/INH AEPB Inhale 1 puff into the lungs daily as needed (for shortness of breath).  12/11/14  Yes Historical Provider, MD  carvedilol (COREG) 12.5 MG tablet Take 6.25 mg by mouth 2 (two) times daily with a meal.  Yes Historical Provider, MD  gabapentin (NEURONTIN) 300 MG capsule Take 300 mg by mouth 2 (two) times daily. Pain 06/25/15  Yes Historical Provider, MD  hydrALAZINE (APRESOLINE) 50 MG tablet Take 1 tablet (50 mg total) by mouth 2 (two) times daily. Patient taking differently: Take 25 mg by mouth 2 (two) times daily.  05/05/16  Yes Deboraha Sprang, MD  HYDROcodone-acetaminophen (NORCO/VICODIN) 5-325 MG tablet Take 0.5 tablets by mouth 2 (two) times daily as needed (for pain).    Yes Historical Provider, MD  magnesium oxide (MAG-OX) 400 MG tablet Take two tablets (800 mg)  by mouth twice daily 09/22/15  Yes Deboraha Sprang, MD  omeprazole (PRILOSEC) 20 MG capsule Take 20 mg by mouth at bedtime.   Yes Historical Provider, MD  tobramycin-dexamethasone [facility]) ophthalmic solution Place 1 drop into the right eye 2 (two) times daily.   Yes Historical Provider, MD  torsemide (DEMADEX) 10 MG tablet Take 10 mg by mouth daily. Or as directed by your physician   Yes Historical Provider, MD  MITIGARE 0.6 MG CAPS Take 0.6 mg by mouth daily. Patient not taking: Reported on 06/07/2016 07/28/15   Larey Dresser, MD    Past Medical History: Past Medical History:  Diagnosis Date  . Arthritis    GOUT  . Automatic implantable cardioverter-defibrillator in situ   . Biventricular ICD (implantable cardiac defibrillator) [employer]onic ]    DOI 2008/ upgrade 2010/ Gen Change 2014  . Bladder tumor    PT HOSP AT [facility] 4/24 TO 4/26 2014 WITH UTI--AND FOUND TO HAVE BLADDER TUMOR-  . Cardiomyopathy    Idiopathic dilated;   . CHF (congestive heart failure) ([place])   . CKD (chronic kidney disease)    stage III baseline Crt 1.8-2.0  . Coronary artery disease   . Gout 08/03/12   PT C/O OF RIGHT KNEE PAIN AND SWELLING -STATES GOUT FLARE UP - ONGOING SINCE APRIL - BUT SWELLING W/IN LAST WEEK  . Hilar mass    Noted CT 2010  . HTN (hypertension)    Dr. Caryl Comes cardiologist  . Hypothyroidism   . Paraganglioma ([place])    //neuroendocrine tumor of the right chest per notes 02/10/2014  . Sleep apnea    CPAP  . Systolic CHF ([place])    EF 00%  . Ventricular tachycardia [facility])    s/p ICD    Past Surgical History: Past Surgical History:  Procedure Laterality Date  . BIV ICD GENERTAOR CHANGE OUT     DOI 2008/ upgrade 2010/ Gen Change 2014   . CARDIAC CATHETERIZATION     he was found to have normal coronary arteries but with a globally dilated and hypocontractile heart  . CARDIAC DEFIBRILLATOR PLACEMENT     DOI 2008/ upgrade 2010/ Gen Change 2014   . CHEST TUBE INSERTION  09/10/2013  .  CHOLECYSTECTOMY    . cornea replacement    . ENDOBRONCHIAL ULTRASOUND Bilateral 10/01/2012   Procedure: ENDOBRONCHIAL ULTRASOUND;  Surgeon: Collene Gobble, MD;  Location: [facility] ENDOSCOPY;  Service: Cardiopulmonary;  Laterality: Bilateral;  . IMPLANTABLE CARDIOVERTER DEFIBRILLATOR (ICD) GENERATOR CHANGE N/A 05/02/2012   Procedure: ICD GENERATOR CHANGE;  Surgeon: Deboraha Sprang, MD;  Location: [facility] CATH LAB;  Service: Cardiovascular;  Laterality: N/A;  . MEDIASTINOSCOPY N/A 10/25/2012   Procedure: MEDIASTINOSCOPY;  Surgeon: [name], MD;  Location: [place];  Service: Thoracic;  Laterality: N/A;  . MEDIASTINOTOMY  09/10/2013   paratracheal mass    DR Darcey Nora  . [place]  Right 09/10/2013   Procedure: MEDIASTINOTOMY CHAMBERLAIN MCNEIL;  Surgeon: [name], MD;  Location: [place];  Service: Thoracic;  Laterality: Right;  . RESECTION OF MEDIASTINAL MASS Right 02/13/2014   Procedure: RESECTION OF MEDIASTINAL MASS;  Surgeon: [name], MD;  Location: [facility] OR;  Service: Thoracic;  Laterality: Right;  PATIENT NEEDS EPIDURAL CATHETER AND A SWAN GANZ CATHETER (DR. CHRIS MOSER AWARE PER PVT)  . RIGHT HEART CATHETERIZATION N/A 01/27/2012   Procedure: RIGHT HEART CATH;  Surgeon: Jolaine Artist, MD;  Location: [facility] CATH LAB;  Service: Cardiovascular;  Laterality: N/A;  . TONSILLECTOMY    . TRANSURETHRAL RESECTION OF BLADDER TUMOR N/A 08/13/2012   Procedure: TRANSURETHRAL RESECTION OF BLADDER TUMOR (TURBT);  Surgeon: Claybon Jabs, MD;  Location: [facility] ORS;  Service: Urology;  Laterality: N/A;  MITOMYCIN C  . US ECHOCARDIOGRAPHY  03-17-2008, 07-03-2006   EF 15-20%, EF 15-20%  . VIDEO ASSISTED THORACOSCOPY (VATS)/THOROCOTOMY Right 02/13/2014   Procedure: VIDEO ASSISTED THORACOSCOPY (VATS)/THOROCOTOMY;  Surgeon: [name], MD;  Location: [facility] OR;  Service: Thoracic;  Laterality: Right;  PATIENT NEEDS EPIDURAL CATHETER (DR. CHRIS MOSER AWARE PER PVT)  . VIDEO BRONCHOSCOPY N/A  10/25/2012   Procedure: VIDEO BRONCHOSCOPY;  Surgeon: [name], MD;  Location: [facility] OR;  Service: Thoracic;  Laterality: N/A;    Family History: Family History  Problem Relation Age of Onset  . Family history unknown: Yes    Social History: Social History   Social History  . Marital status: Married    Spouse name: N/A  . Number of children: 2  . Years of education: N/A   Occupational History  . [employer]   Social History Main Topics  . Smoking status: Never Smoker  . Smokeless tobacco: Never Used  . Alcohol use No  . Drug use: No  . Sexual activity: No   Other Topics Concern  . None   Social History Narrative  . None    Allergies:  Allergies  Allergen Reactions  . Bidil [Isosorb Dinitrate-Hydralazine] Other (See Comments)    Headaches   . Spironolactone Other (See Comments)    Hyperkalemia   . Hydralazine Hcl Other (See Comments)    "Fuzzy headed" feeling    Objective:    Vital Signs:   Temp:  [98 F (36.7 C)-100.3 F (37.9 C)] 98.5 F (36.9 C) (04/06 1209) Pulse Rate:  [36-101] 93 (04/06 1200) Resp:  [19-22] 22 (04/06 1200) BP: (68-116)/(51-97) 110/88 (04/06 1200) SpO2:  [95 %-100 %] 100 % (04/06 1200) FiO2 (%):  [40 %] 40 % (04/06 0801) Weight:  [244 lb 14.9 oz (111.1 kg)] 244 lb 14.9 oz (111.1 kg) (04/06 0309) Last BM Date:  (pta)  Weight change: Filed Weights   06/06/16 1635 06/09/16 0346 06/10/16 0309  Weight: 245 lb (111.1 kg) 244 lb 14.9 oz (111.1 kg) 244 lb 14.9 oz (111.1 kg)    Intake/Output:   Intake/Output Summary (Last 24 hours) at 06/10/16 1225 Last data filed at 06/10/16 1100  Gross per 24 hour  Intake             1788 ml  Output             1300 ml  Net              488 ml     Physical Exam: General:  Intubated.  HEENT. - ETT in place Neck: supple. JVP elevated Carotids 2+ bilat; no bruits. No lymphadenopathy or thyromegaly  appreciated. Cor: PMI nondisplaced. Tachy Regular. No rubs, gallops or  murmurs. Lungs: Mechanical coarse throughout Abdomen: obese, soft, nontender, nondistended. No hepatosplenomegaly. No bruits or masses. Good bowel sounds. Extremities: no cyanosis, clubbing, rash, edema R and LLE cool.  Neuro: intubated but follows commands. MAE.  Telemetry: Sinus Tach 140s personally reviewed.   Labs: Basic Metabolic Panel:  Recent Labs Lab 06/06/16 1925 06/07/16 0057 06/08/16 0431 06/08/16 1633 06/09/16 0534 06/09/16 1940 06/10/16 0638  NA 138 139 141  --  142  --  144  K 4.1 3.7 4.1  --  3.9  --  3.3*  CL 101 98* 100*  --  101  --  103  CO2 _0 --  27  --  26  GLUCOSE 89 94 89  --  123*  --  135*  BUN 47* 48* 50*  --  58*  --  70*  CREATININE 2.54* 2.84* 3.66*  --  3.74*  --  3.37*  CALCIUM 8.6* 8.9 9.1  --  9.0  --  8.7*  MG  --   --  2.1 2.3 2.3 2.3 2.2  PHOS  --   --  4.1 5.3* 3.7 2.8 2.6    Liver Function Tests:  Recent Labs Lab 06/06/16 1816 06/06/16 1925  AST 76* 64*  ALT 51 46  ALKPHOS 108 90  BILITOT 1.4* 1.8*  PROT 6.9 6.1*  ALBUMIN 3.3* 3.0*   No results for input(s): LIPASE, AMYLASE in the last 168 hours. No results for input(s): AMMONIA in the last 168 hours.  CBC:  Recent Labs Lab 06/06/16 1644  06/06/16 1925 06/07/16 0057 06/08/16 0431 06/09/16 0534 06/09/16 1025 06/10/16 0638  WBC 9.5  --  12.4* 9.5 11.2* 13.9*  --  13.1*  NEUTROABS 5.5  --   --   --   --   --  10.2*  --   HGB 16.7  < > 13.6 13.3 14.8 14.6  --  13.9  HCT 50.4  < > 41.3 40.0 44.3 44.8  --  41.3  MCV 90.3  --  88.4 87.9 87.4 88.5  --  87.5  PLT 232  --  155 161 172 153  --  154  < > = values in this interval not displayed.  Cardiac Enzymes:  Recent Labs Lab 06/06/16 1925 06/07/16 0057 06/07/16 0748 06/07/16 1416  TROPONINI 0.14* 0.14* 0.12* 0.17*    BNP: BNP (last 3 results)  Recent Labs  06/06/16 1645 06/06/16 1925  BNP 796.0* 553.0*    ProBNP (last 3 results) No results for input(s): PROBNP in the last 8760  hours.   CBG:  Recent Labs Lab 06/09/16 1930 06/09/16 2319 06/10/16 0402 06/10/16 0751 06/10/16 1138  GLUCAP 130* 135* 123* 117* 129*    Coagulation Studies: No results for input(s): LABPROT, INR in the last 72 hours.  Other results: Imaging:  No results found.   Medications:     Current Medications: . azithromycin  500 mg Intravenous Q24H  . cefTRIAXone (ROCEPHIN)  IV  2 g Intravenous Q24H  . chlorhexidine gluconate (MEDLINE KIT)  15 mL Mouth Rinse BID  . feeding supplement (VITAL HIGH PROTEIN)  1,000 mL Per Tube Q24H  . fentaNYL (SUBLIMAZE) injection  50 mcg Intravenous Once  . furosemide  40 mg Intravenous BID  . heparin  5,000 Units Subcutaneous Q8H  . insulin aspart  2-6 Units Subcutaneous Q4H  . mouth rinse  15 mL Mouth Rinse QID  . pantoprazole (PROTONIX)  IV  40 mg Intravenous Q24H  . pneumococcal 23 valent vaccine  0.5 mL Intramuscular Tomorrow-1000  . potassium chloride  40 mEq Oral Daily  . tobramycin-dexamethasone   Right Eye BID     Infusions: . sodium chloride 10 mL/hr at 06/10/16 1203  . sodium chloride 10 mL/hr at 06/10/16 1203  . fentaNYL infusion INTRAVENOUS Stopped (06/10/16 0818)      Assessment/Plan  Mr Mee Hives is 56 year old well known to the HF team admitted with fever and acute respiratory failure.  1. Acute Respiratory Failure with Hypoxia- has h/o paralyzed hemidiaphragm.  Intubated. Failed extubation on 4/5. Required re-intubation.  2. Sepsis/Cardiogenic Shock  On antibiotics per CCM.  Blood CX 1/2 Strep on 4/2. Repeat blood cx on 4/5 NGDT.  WBC 13.1  3. A/C CKD Creatinine on admit 2.5 and today up to 3.37. Making > 1 liter of fluid.  4. A/C Systolic Heart Failure-  ECHO 06/09/2016 LVEF 20% and RV normal.  Appears cool and wet. At 10 pounds above baseline. Give another 80 mg IV lasix now. No BB with shock.   Place central line for CVP and CO-OX.  No PICC with elevated creatinine.  5. Sinus Tach- Has CRT-D . EP verified today.  There was concern about blood cultures on 4/2 with 1/2 strep. Blood CX redrawn on 4/5. May need TEE down the road.   6. Hypothyroidism. - Check TSH  7. H/O bladder cancer   I discussed with EP, Dr Caryl Comes the best course of action regarding tachycardia and concern for cardiogenic shock. Given that he is tachycardic I will not start an inotrope until heart better controlled with sedation. Dr Caryl Comes discussed with Dr Elsworth Soho and he agrees with sedation, central access, followed by inotropes once heart rate settles down.    Length of Stay: 4  Amy Clegg, NP  06/10/2016, 12:25 PM  Advanced Heart Failure Team Pager [phone] (M-F; 7a - 4p)  Please contact [facility] for night-coverage after hours (4p -7a ) and weekends on [email or web address]  Patient seen and examined with Darrick Grinder, NP. We discussed all aspects of the encounter. I agree with the assessment and plan as stated above.   56 y/o male with severe chronic systolic HF and CKD admitted with respiratory failure and possible PNA. Course complicated by septic shock and AKI. Bcx 1/2 + for strep. Remains intubated. HR has been fast. EP following. Shown to be sinus tach.   BP remains soft. Creatinine 2.5 -> 3.7.   On exam intubated and awake. Follows commands and communicates with writing. JVP 9-10. Cor RRR and tachy. Probable s3. Diffuse pulmonary rhonchi. Extremities warm.   Given low EF may also have a component of cardiogenic shock. Have discussed with CCM who will place central access to follow CVP and co-ox. Low threshold to add norepi.   HF team will follow.   Glori Bickers, MD  3:55 PM

## 2016-06-10 NOTE — Progress Notes (Signed)
SUBJECTIVE: The patient intubated, sedated, wakes to verbal Increasingly tachycardic and low blood pressure   Presented with sepsis T `104  1/2 BC +  Spoke with ID today ([facility]) and would consider 1/2 for strep viridans as contaminant and NOT target therapy Cr 1.0 on 06/02/16 [employer]    . azithromycin  500 mg Intravenous Q24H  . cefTRIAXone (ROCEPHIN)  IV  2 g Intravenous Q24H  . chlorhexidine gluconate (MEDLINE KIT)  15 mL Mouth Rinse BID  . feeding supplement (VITAL HIGH PROTEIN)  1,000 mL Per Tube Q24H  . fentaNYL (SUBLIMAZE) injection  50 mcg Intravenous Once  . furosemide  40 mg Intravenous BID  . heparin  5,000 Units Subcutaneous Q8H  . insulin aspart  2-6 Units Subcutaneous Q4H  . mouth rinse  15 mL Mouth Rinse QID  . pantoprazole (PROTONIX) IV  40 mg Intravenous Q24H  . pneumococcal 23 valent vaccine  0.5 mL Intramuscular Tomorrow-1000  . potassium chloride  40 mEq Oral Daily  . tobramycin-dexamethasone   Right Eye BID   . sodium chloride 10 mL/hr at 06/10/16 1203  . sodium chloride 10 mL/hr at 06/10/16 1203  . fentaNYL infusion INTRAVENOUS Stopped (06/10/16 0818)    OBJECTIVE: Physical Exam: Vitals:   06/10/16 1200 06/10/16 1209 06/10/16 1220 06/10/16 1230  BP: 110/88  110/88 (!) 113/101  Pulse: 93  (!) 105 (!) 116  Resp: (!) 22  (!) 22 (!) 22  Temp:  98.5 F (36.9 C)    TempSrc:  Oral    SpO2: 100%  100% 99%  Weight:      Height:        Intake/Output Summary (Last 24 hours) at 06/10/16 1332 Last data filed at 06/10/16 1243  Gross per 24 hour  Intake             1707 ml  Output             1500 ml  Net              207 ml    Telemetryis reviewed by myself/Dr. Caryl Comes: SR/ST  GEN- The patient intubated, wakes to verbal Head- normocephalic, atraumatic Eyes-  Sclera clear, conjunctiva pink Oropharynx- intubated Lungs- scattered rhonchi b/l  Heart- RRR, tachycardic, no significant murmurs, no rubs or gallops GI- soft, ND Extremities- no clubbing,  cyanosis, no edema Skin- no rash or lesion Psych- unable to assess Neuro- unable to assess  LABS: Basic Metabolic Panel:  Recent Labs  06/09/16 0534 06/09/16 1940 06/10/16 0638  NA 142  --  144  K 3.9  --  3.3*  CL 101  --  103  CO2 27  --  26  GLUCOSE 123*  --  135*  BUN 58*  --  70*  CREATININE 3.74*  --  3.37*  CALCIUM 9.0  --  8.7*  MG 2.3 2.3 2.2  PHOS 3.7 2.8 2.6   Liver Function Tests: No results for input(s): AST, ALT, ALKPHOS, BILITOT, PROT, ALBUMIN in the last 72 hours. CBC:  Recent Labs  06/09/16 0534 06/09/16 1025 06/10/16 0638  WBC 13.9*  --  13.1*  NEUTROABS  --  10.2*  --   HGB 14.6  --  13.9  HCT 44.8  --  41.3  MCV 88.5  --  87.5  PLT 153  --  154   Cardiac Enzymes:  Recent Labs  06/07/16 1416  TROPONINI 0.17*   Fasting Lipid Panel:  Recent Labs  06/08/16 0431  TRIG  115    RADIOLOGY: Dg Chest Port 1 View Result Date: 06/09/2016 CLINICAL DATA:  Daily check for Vent and CVL patient. EXAM: PORTABLE CHEST 1 VIEW COMPARISON:  06/08/2016 FINDINGS: Patient's endotracheal tube, tip 2.4 cm above the carina. A nasogastric tube is in place with tip beyond the gastroesophageal junction on the film. I such as his pacemaker leads overlie the right ventricle and right atrium. Heart is enlarged. There is mild bibasilar airspace filling. There has been some improvement in aeration of the left lung base with the left hemidiaphragm more apparent. Multiple old rib fractures. IMPRESSION: Slight improvement in aeration of left lung base. Persistent bibasilar airspace filling. Electronically Signed   By: Nolon Nations M.D.   On: 06/09/2016 08:55      ASSESSMENT AND PLAN:    1. Respiratory failure      immediately decompensated upon extubation and re-intubated yesterday      Fever 104 in the ER      ? Pneumonia, pulmonary edema, bronchitis      Paralyzed hemidiaphragm   2. Febrile illness     Blood cultures remain only 1 of 2 positive for gram +  cocci, VIRIDANS STREPTOCOCCUS (3 days)     WBC 13.1     Intermittent low grade temp 100.3, afebrile today so far     Repeat cultures drawn yesterday   SEE above from ID  3. Hx of NICM CHF, by last echo EF 10% w/severe MR     Creat remains up, slightly lower then yesterday     Fluid + last 2 days, cumulatively negative -3094, remains fluid OL     Bed weight unchanged from yesterday     HF team seeing in consult  Echo yesterday with EF 20%, mod MR      4. CRT-ICD      initial BC remain 1 of 2 positive (3 days), repeat BC drawn yesterday      Pending final BC results to decide regarding need or not for device extraction  5. Tachycardia      130-140's , looks sinus, timing correlates with waking/anxious         Dr. Caryl Comes has discussed with CCM, CHF, plan will be to allow/continue sedation to avoid the ST to allow for pressor support and diuresis.     Tommye Standard, PA-C 06/10/2016 1:32 PM  As above Seen and examined and discussed withCCMand CHF  Hemodynamics are failing with worsening renal function and with poor LV function prob needs iontropic support and his sinus tachycardia is probably preclusive. This is related to agitation. Hence, recommend deeper sedation with inotropic support to maximize heart failure therapy and then reattempt extubation.  For now, unless there other findings to support bacteremia would probably hold off on TEE to look at the leads although if there questions doing it while he is intubated makes the most sense. I suspect however that one out of 2 blood cultures for strep. Viridans is probably a contaminant and we'll need to keep her eyes open for alternative explanations for his sepsis syndrome

## 2016-06-10 NOTE — Progress Notes (Signed)
Pharmacist Heart Failure Core Measure Documentation  Assessment: [name] has an EF documented as 20% on 06/09/16 by ECHO.  Rationale: Heart failure patients with left ventricular systolic dysfunction (LVSD) and an EF < 40% should be prescribed an angiotensin converting enzyme inhibitor (ACEI) or angiotensin receptor blocker (ARB) at discharge unless a contraindication is documented in the medical record.  This patient is not currently on an ACEI or ARB for HF.  This note is being placed in the record in order to provide documentation that a contraindication to the use of these agents is present for this encounter.  ACE Inhibitor or Angiotensin Receptor Blocker is contraindicated (specify all that apply)  []   ACEI allergy AND ARB allergy []   Angioedema []   Moderate or severe aortic stenosis []   Hyperkalemia [x]   Hypotension []   Renal artery stenosis [x]   Worsening renal function, preexisting renal disease or dysfunction   [name] 06/10/2016 11:45 AM

## 2016-06-10 NOTE — Procedures (Signed)
Central Venous Catheter Insertion Procedure Note JEANCARLO LEFFLER [id number] 1960-12-15  Procedure: Insertion of Central Venous Catheter Indications: Drug and/or fluid administration  Procedure Details Consent: Risks of procedure as well as the alternatives and risks of each were explained to the (patient/caregiver).  Consent for procedure obtained. Time Out: Verified patient identification, verified procedure, site/side was marked, verified correct patient position, special equipment/implants available, medications/allergies/relevent history reviewed, required imaging and test results available.  Performed  Maximum sterile technique was used including antiseptics, cap, gloves, gown, hand hygiene, mask and sheet. Skin prep: Iodine solution; local anesthetic administered A antimicrobial bonded/coated triple lumen catheter was placed in the right internal jugular vein using the Seldinger technique under direct ultrasound visualization.  Attempt was made to place central venous catheter in the RIJ without success. CXR after first attempt did not show a pneumothorax.   Evaluation Blood flow good Complications: No apparent complications Patient did tolerate procedure well. Chest X-ray ordered to verify placement.  CXR: pending.  [name] 06/10/2016, 6:46 PM

## 2016-06-10 NOTE — Consult Note (Signed)
Reason for Consult:AKI, CKD 3 Referring Physician: Dr. Nelda Marseille: HPI:   56 yr male with hx CKD3 from [facility], hx CM with EF 10%, paralyzed diaphragm, paraganglioma, corneal TX, hpothyroid, gout, bivent pacer, arrhythmias, admitted 4/2 with SOB, fever 104. Required entub, tx as sepsis, only 1of2 pos BC.  Has been extub now reentub.  Cr baseline 1.5 1 yr ago and was 2.4 on admit, risen to 3.37.  Nonoliguric.Marland Kitchen  No NSAIDs and no listed ACEI/ARB but is in record as active last yr.  bp had been low in 80-100 range. .   Review of systems not obtained due to patient factors.   Primary Nephrologist Colodonato.   Past Medical History:  Diagnosis Date  . Arthritis    GOUT  . Automatic implantable cardioverter-defibrillator in situ   . Biventricular ICD (implantable cardiac defibrillator) [employer]onic ]    DOI 2008/ upgrade 2010/ Gen Change 2014  . Bladder tumor    PT HOSP AT [facility] 4/24 TO 4/26 2014 WITH UTI--AND FOUND TO HAVE BLADDER TUMOR-  . Cardiomyopathy    Idiopathic dilated;   . CHF (congestive heart failure) ([place])   . CKD (chronic kidney disease)    stage III baseline Crt 1.8-2.0  . Coronary artery disease   . Gout 08/03/12   PT C/O OF RIGHT KNEE PAIN AND SWELLING -STATES GOUT FLARE UP - ONGOING SINCE APRIL - BUT SWELLING W/IN LAST WEEK  . Hilar mass    Noted CT 2010  . HTN (hypertension)    Dr. Caryl Comes cardiologist  . Hypothyroidism   . 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 Sleep apnea    CPAP  . Systolic CHF ([place])    EF 82%  . Ventricular tachycardia [facility])    s/p ICD    Past Surgical History:  Procedure Laterality Date  . BIV ICD GENERTAOR CHANGE OUT     DOI 2008/ upgrade 2010/ Gen Change 2014   . CARDIAC CATHETERIZATION     he was found to have normal coronary arteries but with a globally dilated and hypocontractile heart  . CARDIAC DEFIBRILLATOR PLACEMENT     DOI 2008/ upgrade 2010/ Gen Change 2014   . CHEST TUBE INSERTION   09/10/2013  . CHOLECYSTECTOMY    . cornea replacement    . ENDOBRONCHIAL ULTRASOUND Bilateral 10/01/2012   Procedure: ENDOBRONCHIAL ULTRASOUND;  Surgeon: Collene Gobble, MD;  Location: [facility];  Service: Cardiopulmonary;  Laterality: Bilateral;  . IMPLANTABLE CARDIOVERTER DEFIBRILLATOR (ICD) GENERATOR CHANGE N/A 05/02/2012   Procedure: ICD GENERATOR CHANGE;  Surgeon: Deboraha Sprang, MD;  Location: [facility] CATH LAB;  Service: Cardiovascular;  Laterality: N/A;  . MEDIASTINOSCOPY N/A 10/25/2012   Procedure: MEDIASTINOSCOPY;  Surgeon: [name], MD;  Location: [place];  Service: Thoracic;  Laterality: N/A;  . MEDIASTINOTOMY  09/10/2013   paratracheal mass    DR Darcey Nora  . MEDIASTINOTOMY CHAMBERLAIN MCNEIL Right 09/10/2013   Procedure: MEDIASTINOTOMY CHAMBERLAIN MCNEIL;  Surgeon: [name], MD;  Location: [place];  Service: Thoracic;  Laterality: Right;  . RESECTION OF MEDIASTINAL MASS Right 02/13/2014   Procedure: RESECTION OF MEDIASTINAL MASS;  Surgeon: [name], MD;  Location: [facility] OR;  Service: Thoracic;  Laterality: Right;  PATIENT NEEDS EPIDURAL CATHETER AND A SWAN GANZ CATHETER (DR. CHRIS MOSER AWARE PER PVT)  . RIGHT HEART CATHETERIZATION N/A 01/27/2012   Procedure: RIGHT HEART CATH;  Surgeon: Jolaine Artist, MD;  Location: [facility] CATH LAB;  Service:  Cardiovascular;  Laterality: N/A;  . TONSILLECTOMY    . TRANSURETHRAL RESECTION OF BLADDER TUMOR N/A 08/13/2012   Procedure: TRANSURETHRAL RESECTION OF BLADDER TUMOR (TURBT);  Surgeon: Claybon Jabs, MD;  Location: [facility] ORS;  Service: Urology;  Laterality: N/A;  MITOMYCIN C  . US ECHOCARDIOGRAPHY  03-17-2008, 07-03-2006   EF 15-20%, EF 15-20%  . VIDEO ASSISTED THORACOSCOPY (VATS)/THOROCOTOMY Right 02/13/2014   Procedure: VIDEO ASSISTED THORACOSCOPY (VATS)/THOROCOTOMY;  Surgeon: [name], MD;  Location: [facility] OR;  Service: Thoracic;  Laterality: Right;  PATIENT NEEDS EPIDURAL CATHETER (DR. CHRIS MOSER AWARE PER PVT)  . VIDEO  BRONCHOSCOPY N/A 10/25/2012   Procedure: VIDEO BRONCHOSCOPY;  Surgeon: [name], MD;  Location: [facility] OR;  Service: Thoracic;  Laterality: N/A;    Family History  Problem Relation Age of Onset  . Family history unknown: Yes    Social History:  reports that he has never smoked. He has never used smokeless tobacco. He reports that he does not drink alcohol or use drugs.  Allergies:  Allergies  Allergen Reactions  . Bidil [Isosorb Dinitrate-Hydralazine] Other (See Comments)    Headaches   . Spironolactone Other (See Comments)    Hyperkalemia   . Hydralazine Hcl Other (See Comments)    "Fuzzy headed" feeling    Medications:  I have reviewed the patient's current medications. Prior to Admission:  Prescriptions Prior to Admission  Medication Sig Dispense Refill Last Dose  . atorvastatin (LIPITOR) 40 MG tablet TAKE 1 TABLET BY MOUTH AT BEDTIME 30 tablet 2 06/05/2016 at HS  . BREO ELLIPTA 100-25 MCG/INH AEPB Inhale 1 puff into the lungs daily as needed (for shortness of breath).   11 Past Week at Unknown time  . carvedilol (COREG) 12.5 MG tablet Take 6.25 mg by mouth 2 (two) times daily with a meal.    06/06/2016 at AM  . gabapentin (NEURONTIN) 300 MG capsule Take 300 mg by mouth 2 (two) times daily. Pain   06/06/2016 at am  . hydrALAZINE (APRESOLINE) 50 MG tablet Take 1 tablet (50 mg total) by mouth 2 (two) times daily. (Patient taking differently: Take 25 mg by mouth 2 (two) times daily. ) 180 tablet 3 06/06/2016 at am  . HYDROcodone-acetaminophen (NORCO/VICODIN) 5-325 MG tablet Take 0.5 tablets by mouth 2 (two) times daily as needed (for pain).    Past Week at Unknown time  . magnesium oxide (MAG-OX) 400 MG tablet Take two tablets (800 mg) by mouth twice daily 120 tablet 11 06/06/2016 at am  . omeprazole (PRILOSEC) 20 MG capsule Take 20 mg by mouth at bedtime.   Past Week at Unknown time  . tobramycin-dexamethasone (TOBRADEX) ophthalmic solution Place 1 drop into the right eye 2 (two) times  daily.   Unknown at Unknown  . torsemide (DEMADEX) 10 MG tablet Take 10 mg by mouth daily. Or as directed by your physician   Unknown at Unknown  . MITIGARE 0.6 MG CAPS Take 0.6 mg by mouth daily. (Patient not taking: Reported on 06/07/2016) 30 capsule 6 Not Taking at Unknown time     Results for orders placed or performed during the hospital encounter of 06/06/16 (from the past 48 hour(s))  Magnesium     Status: None   Collection Time: 06/08/16  4:33 PM  Result Value Ref Range   Magnesium 2.3 1.7 - 2.4 mg/dL  Phosphorus     Status: Abnormal   Collection Time: 06/08/16  4:33 PM  Result Value Ref Range   Phosphorus 5.3 (H)  2.5 - 4.6 mg/dL  Glucose, capillary     Status: None   Collection Time: 06/08/16  7:37 PM  Result Value Ref Range   Glucose-Capillary 96 65 - 99 mg/dL   Comment 1 Notify RN    Comment 2 Document in Chart   Glucose, capillary     Status: Abnormal   Collection Time: 06/08/16 11:25 PM  Result Value Ref Range   Glucose-Capillary 117 (H) 65 - 99 mg/dL   Comment 1 Notify RN    Comment 2 Document in Chart   Glucose, capillary     Status: Abnormal   Collection Time: 06/09/16  3:37 AM  Result Value Ref Range   Glucose-Capillary 112 (H) 65 - 99 mg/dL   Comment 1 Notify RN    Comment 2 Document in Chart   Basic metabolic panel     Status: Abnormal   Collection Time: 06/09/16  5:34 AM  Result Value Ref Range   Sodium 142 135 - 145 mmol/L   Potassium 3.9 3.5 - 5.1 mmol/L   Chloride 101 101 - 111 mmol/L   CO2 27 22 - 32 mmol/L   Glucose, Bld 123 (H) 65 - 99 mg/dL   BUN 58 (H) 6 - 20 mg/dL   Creatinine, Ser 3.74 (H) 0.61 - 1.24 mg/dL   Calcium 9.0 8.9 - 10.3 mg/dL   GFR calc non Af Amer 17 (L) >60 mL/min   GFR calc Af Amer 19 (L) >60 mL/min    Comment: (NOTE) The eGFR has been calculated using the CKD EPI equation. This calculation has not been validated in all clinical situations. eGFR's persistently <60 mL/min signify possible Chronic Kidney Disease.    Anion  gap 14 5 - 15  CBC     Status: Abnormal   Collection Time: 06/09/16  5:34 AM  Result Value Ref Range   WBC 13.9 (H) 4.0 - 10.5 K/uL   RBC 5.06 4.22 - 5.81 MIL/uL   Hemoglobin 14.6 13.0 - 17.0 g/dL   HCT 44.8 39.0 - 52.0 %   MCV 88.5 78.0 - 100.0 fL   MCH 28.9 26.0 - 34.0 pg   MCHC 32.6 30.0 - 36.0 g/dL   RDW 15.2 11.5 - 15.5 %   Platelets 153 150 - 400 K/uL  Magnesium     Status: None   Collection Time: 06/09/16  5:34 AM  Result Value Ref Range   Magnesium 2.3 1.7 - 2.4 mg/dL  Phosphorus     Status: None   Collection Time: 06/09/16  5:34 AM  Result Value Ref Range   Phosphorus 3.7 2.5 - 4.6 mg/dL  Glucose, capillary     Status: Abnormal   Collection Time: 06/09/16  8:18 AM  Result Value Ref Range   Glucose-Capillary 115 (H) 65 - 99 mg/dL   Comment 1 Notify RN    Comment 2 Document in Chart   Culture, blood (routine x 2)     Status: None (Preliminary result)   Collection Time: 06/09/16 10:25 AM  Result Value Ref Range   Specimen Description BLOOD RIGHT HAND    Special Requests IN PEDIATRIC BOTTLE Blood Culture adequate volume    Culture NO GROWTH 1 DAY    Report Status PENDING   Differential     Status: Abnormal   Collection Time: 06/09/16 10:25 AM  Result Value Ref Range   Neutrophils Relative % 77 %   Neutro Abs 10.2 (H) 1.7 - 7.7 K/uL   Lymphocytes Relative 10 %  Lymphs Abs 1.4 0.7 - 4.0 K/uL   Monocytes Relative 11 %   Monocytes Absolute 1.5 (H) 0.1 - 1.0 K/uL   Eosinophils Relative 2 %   Eosinophils Absolute 0.2 0.0 - 0.7 K/uL   Basophils Relative 0 %   Basophils Absolute 0.0 0.0 - 0.1 K/uL  Culture, blood (routine x 2)     Status: None (Preliminary result)   Collection Time: 06/09/16 10:31 AM  Result Value Ref Range   Specimen Description BLOOD RIGHT THUMB    Special Requests IN PEDIATRIC BOTTLE Blood Culture adequate volume    Culture NO GROWTH 1 DAY    Report Status PENDING   Glucose, capillary     Status: Abnormal   Collection Time: 06/09/16 11:57 AM   Result Value Ref Range   Glucose-Capillary 197 (H) 65 - 99 mg/dL   Comment 1 Notify RN    Comment 2 Repeat Test    Comment 3 Document in Chart   Glucose, capillary     Status: Abnormal   Collection Time: 06/09/16 11:59 AM  Result Value Ref Range   Glucose-Capillary 262 (H) 65 - 99 mg/dL   Comment 1 Notify RN    Comment 2 Repeat Test    Comment 3 Document in Chart   Glucose, capillary     Status: Abnormal   Collection Time: 06/09/16  3:44 PM  Result Value Ref Range   Glucose-Capillary 121 (H) 65 - 99 mg/dL   Comment 1 Notify RN    Comment 2 Document in Chart   Glucose, capillary     Status: Abnormal   Collection Time: 06/09/16  7:30 PM  Result Value Ref Range   Glucose-Capillary 130 (H) 65 - 99 mg/dL   Comment 1 Notify RN    Comment 2 Document in Chart   Magnesium     Status: None   Collection Time: 06/09/16  7:40 PM  Result Value Ref Range   Magnesium 2.3 1.7 - 2.4 mg/dL  Phosphorus     Status: None   Collection Time: 06/09/16  7:40 PM  Result Value Ref Range   Phosphorus 2.8 2.5 - 4.6 mg/dL  Glucose, capillary     Status: Abnormal   Collection Time: 06/09/16 11:19 PM  Result Value Ref Range   Glucose-Capillary 135 (H) 65 - 99 mg/dL   Comment 1 Notify RN    Comment 2 Document in Chart   Glucose, capillary     Status: Abnormal   Collection Time: 06/10/16  4:02 AM  Result Value Ref Range   Glucose-Capillary 123 (H) 65 - 99 mg/dL   Comment 1 Notify RN    Comment 2 Document in Chart   Magnesium     Status: None   Collection Time: 06/10/16  6:38 AM  Result Value Ref Range   Magnesium 2.2 1.7 - 2.4 mg/dL  Phosphorus     Status: None   Collection Time: 06/10/16  6:38 AM  Result Value Ref Range   Phosphorus 2.6 2.5 - 4.6 mg/dL  CBC     Status: Abnormal   Collection Time: 06/10/16  6:38 AM  Result Value Ref Range   WBC 13.1 (H) 4.0 - 10.5 K/uL   RBC 4.72 4.22 - 5.81 MIL/uL   Hemoglobin 13.9 13.0 - 17.0 g/dL   HCT 41.3 39.0 - 52.0 %   MCV 87.5 78.0 - 100.0 fL   MCH  29.4 26.0 - 34.0 pg   MCHC 33.7 30.0 - 36.0 g/dL   RDW 14.4  11.5 - 15.5 %   Platelets 154 150 - 400 K/uL  Basic metabolic panel     Status: Abnormal   Collection Time: 06/10/16  6:38 AM  Result Value Ref Range   Sodium 144 135 - 145 mmol/L   Potassium 3.3 (L) 3.5 - 5.1 mmol/L   Chloride 103 101 - 111 mmol/L   CO2 26 22 - 32 mmol/L   Glucose, Bld 135 (H) 65 - 99 mg/dL   BUN 70 (H) 6 - 20 mg/dL   Creatinine, Ser 3.37 (H) 0.61 - 1.24 mg/dL   Calcium 8.7 (L) 8.9 - 10.3 mg/dL   GFR calc non Af Amer 19 (L) >60 mL/min   GFR calc Af Amer 22 (L) >60 mL/min    Comment: (NOTE) The eGFR has been calculated using the CKD EPI equation. This calculation has not been validated in all clinical situations. eGFR's persistently <60 mL/min signify possible Chronic Kidney Disease.    Anion gap 15 5 - 15  Glucose, capillary     Status: Abnormal   Collection Time: 06/10/16  7:51 AM  Result Value Ref Range   Glucose-Capillary 117 (H) 65 - 99 mg/dL   Comment 1 Notify RN    Comment 2 Document in Chart   Glucose, capillary     Status: Abnormal   Collection Time: 06/10/16 11:38 AM  Result Value Ref Range   Glucose-Capillary 129 (H) 65 - 99 mg/dL   Comment 1 Document in Chart   TSH     Status: None   Collection Time: 06/10/16  2:26 PM  Result Value Ref Range   TSH 1.589 0.350 - 4.500 uIU/mL    Comment: Performed by a 3rd Generation assay with a functional sensitivity of <=0.01 uIU/mL.    Dg Chest Port 1 View  Result Date: 06/09/2016 CLINICAL DATA:  Respiratory failure requiring intubation EXAM: PORTABLE CHEST 1 VIEW COMPARISON:  Chest x-ray of 06/09/2016 FINDINGS: The tip of the endotracheal tube is approximately 2.0 cm above the carina. AICD and pacer leads remain. Cardiomegaly is stable. OG tube extends into the stomach. There does appear to be pulmonary vascular congestion present. No pleural effusion is seen. IMPRESSION: 1. Tip of endotracheal tube 2.0 cm above the carina. 2. Cardiomegaly and  pulmonary vascular congestion. 3. OG tube extends into the stomach, with the tip not visualized. Electronically Signed   By: Ivar Drape M.D.   On: 06/09/2016 11:59   Dg Chest Port 1 View  Result Date: 06/09/2016 CLINICAL DATA:  Daily check for Vent and CVL patient. EXAM: PORTABLE CHEST 1 VIEW COMPARISON:  06/08/2016 FINDINGS: Patient's endotracheal tube, tip 2.4 cm above the carina. A nasogastric tube is in place with tip beyond the gastroesophageal junction on the film. I such as his pacemaker leads overlie the right ventricle and right atrium. Heart is enlarged. There is mild bibasilar airspace filling. There has been some improvement in aeration of the left lung base with the left hemidiaphragm more apparent. Multiple old rib fractures. IMPRESSION: Slight improvement in aeration of left lung base. Persistent bibasilar airspace filling. Electronically Signed   By: Nolon Nations M.D.   On: 06/09/2016 08:55   Dg Abd Portable 1v  Result Date: 06/09/2016 CLINICAL DATA:  Respiratory failure requiring intubation EXAM: PORTABLE ABDOMEN - 1 VIEW COMPARISON:  CT abdomen pelvis of 05/26/2015 FINDINGS: A portable supine film of the abdomen shows some air in the colon but no bowel obstruction is seen. An OG tube is present with the tip in  the region of duodenal bulb or descending duodenum. No opaque calculi are seen. IMPRESSION: 1. OG tube tip in the region of the duodenal bulb or descending duodenum. 2. Some colonic gas but no bowel obstruction is seen. Electronically Signed   By: Ivar Drape M.D.   On: 06/09/2016 12:00    ROS Blood pressure 101/71, pulse (!) 108, temperature 100.1 F (37.8 C), temperature source Oral, resp. rate (!) 22, height '5\' 6"'$  (1.676 m), weight 111.1 kg (244 lb 14.9 oz), SpO2 91 %. Physical Exam Physical Examination: General appearance - entub , lethargic obese Mental status - cannot respond to commands, sedated Eyes - R eye with some corneal opacity, deviates Lat Neck - adenopathy  noted PCL Lymphatics - posterior cervical nodes Chest - rales noted bilat, rhonchi noted bilat Pacer on L Heart - S1 and S2 normal, systolic murmur MV3/6 at apex Abdomen - soft, liver down 6 cm Neurological - moves to stim, CN as above, sym facies Extremities - pedal edema Tr +, intact peripheral pulses Skin - normal coloration and turgor, no rashes, no suspicious skin lesions noted  Assessment/Plan: 1 CKD 3 /AKI low perfusion. Cardiorenal.  Need to check Urine/eos and check C" with strep in blood.  Vol ok. Hold off Lasix at this time, [place] pressors or inotropes 2 CKD3 Baseline GRR 45 3 CM per cards 4. VDRF CHF + 5. Paraganglioma 6 pos BC P hold lasix, check urine and chem, urine eos, comp,   [name] L 06/10/2016, 4:01 PM

## 2016-06-10 NOTE — Progress Notes (Addendum)
PULMONARY  / CRITICAL CARE MEDICINE  Name: [name] MRN: [id number] DOB: 1960-07-06    LOS: 93  REFERRING MD :  Dr. Winfred Leeds  CHIEF COMPLAINT:  SOB  BRIEF PATIENT DESCRIPTION: 44M w/ PMHx of CHF, hemidiaphragm s/p paraganglioma resection, CKD, CAD, ICD, and hypothyroidism who presents w/ SOB who was initially tx for CHF exacerbation in the ED and then worked up for sepsis after developing temp of 104.73F while in the ED.   Lines / Drains: ETT>> 4/2 OG>> 4/5  Cultures: Blood cultures x 2 >>4/2-- 1/2 strep viridans  Blood culture >> 4/5  Antibiotics: Vanc >>4/2-4/3 Zosyn >>4/24-4/5 Azithro >>4/5 Rocephin >>4/5  Tests / Events: CXR 4/2 >> mild vascular congestion, stable cardiomegaly CXR 4/5 >> pulmonary vascular congestion  \ Subjective: alert, requesting renal consultation as he is concerned about his creatinine and had previously required HD for 1 day for a CHF exacerbation. Wife and son at beside and updated of progress.   VITAL SIGNS: Temp:  [98 F (36.7 C)-100.3 F (37.9 C)] 99.5 F (37.5 C) (04/06 0754) Pulse Rate:  [36-128] 97 (04/06 1000) Resp:  [19-22] 19 (04/06 1000) BP: (68-131)/(51-97) 105/88 (04/06 1000) SpO2:  [95 %-100 %] 100 % (04/06 1000) FiO2 (%):  [40 %] 40 % (04/06 0801) Weight:  [244 lb 14.9 oz (111.1 kg)] 244 lb 14.9 oz (111.1 kg) (04/06 0309)   HEMODYNAMICS:   VENTILATOR SETTINGS: Vent Mode: PRVC FiO2 (%):  [40 %] 40 % Set Rate:  [22 bmp] 22 bmp Vt Set:  [510 mL] 510 mL PEEP:  [5 cmH20] 5 cmH20 Plateau Pressure:  [20 cmH20-27 cmH20] 20 cmH20  INTAKE / OUTPUT: Intake/Output      04/05 0701 - 04/06 0700 04/06 0701 - 04/07 0700   I.V. (mL/kg) 332 (3) 20.3 (0.2)   NG/GT 1320 120   IV Piggyback 325    Total Intake(mL/kg) 1977 (17.8) 140.3 (1.3)   Urine (mL/kg/hr) 1000 (0.4) 600 (1.6)   Stool 0 (0)    Total Output 1000 600   Net +977 -459.7        Urine Occurrence 2 x    Stool Occurrence 1 x     PHYSICAL  EXAMINATION: General:  NAD, alert and interactive Neuro:  RASS 0, moving all extremities  HEENT:  [place]/AT, PERRL, EOM-I and MMM, ETT in place Cardiovascular:  RRR, no murmurs Lungs:  CTAB, no wheezing Abdomen:  Soft, non tender, non distended Skin:  Warm and dry Ext: trace pedal edema  LABS: Cbc  Recent Labs Lab 06/08/16 0431 06/09/16 0534 06/10/16 0638  WBC 11.2* 13.9* 13.1*  HGB 14.8 14.6 13.9  HCT 44.3 44.8 41.3  PLT 172 153 154   Chemistry  Recent Labs Lab 06/08/16 0431  06/09/16 0534 06/09/16 1940 06/10/16 0638  NA 141  --  142  --  144  K 4.1  --  3.9  --  3.3*  CL 100*  --  101  --  103  CO2 27  --  27  --  26  BUN 50*  --  58*  --  70*  CREATININE 3.66*  --  3.74*  --  3.37*  CALCIUM 9.1  --  9.0  --  8.7*  MG 2.1  < > 2.3 2.3 2.2  PHOS 4.1  < > 3.7 2.8 2.6  GLUCOSE 89  --  123*  --  135*  < > = values in this interval not displayed. Liver fxn  Recent Labs Lab  06/06/16 1816 06/06/16 1925  AST 76* 64*  ALT 51 46  ALKPHOS 108 90  BILITOT 1.4* 1.8*  PROT 6.9 6.1*  ALBUMIN 3.3* 3.0*   coags  Recent Labs Lab 06/06/16 1925  APTT 35  INR 1.22   Sepsis markers  Recent Labs Lab 06/06/16 1650 06/06/16 1925 06/06/16 2116 06/07/16 0057 06/08/16 0431  LATICACIDVEN 5.50* 1.8 1.8  --   --   PROCALCITON  --  1.11  --  5.98 12.28   Cardiac markers  Recent Labs Lab 06/07/16 0057 06/07/16 0748 06/07/16 1416  TROPONINI 0.14* 0.12* 0.17*   BNP No results for input(s): PROBNP in the last 168 hours. ABG  Recent Labs Lab 06/06/16 1650 06/06/16 1800 06/06/16 1940 06/08/16 0350  PHART  --  7.349* 7.467* 7.515*  PCO2ART  --  61.0* 39.4 33.6  PO2ART  --  414.0* 134.0* 129*  HCO3  --  32.5* 27.7 26.9  TCO2 33 34 29  --    CBG trend  Recent Labs Lab 06/09/16 1544 06/09/16 1930 06/09/16 2319 06/10/16 0402 06/10/16 0751  GLUCAP 121* 130* 135* 123* 117*   IMAGING:  ECG:  DIAGNOSES: Active Problems:   Acute respiratory failure  with hypoxemia (HCC)   Pressure injury of skin  ASSESSMENT / PLAN:  PULMONARY A: VDRF due to pulmonary edema and concern for bronchitis. History of a paralyzed hemidiaphragm History of Paraganglioma Cardiorenal syndrome P:   - reintubated yesterday due to SVT and distress - Titrate O2 for sat of 88-92%. - HF consulted, likely will need milrinone gtt for cardiorenal syndrome    CARDIOVASCULAR A:  EF 10% Hypertension P:  - Blood pressures normal, on lasix IV 40mg  BID will decrease if BP cannot tolerate it - HFconsulted] - ECHO done, results pending  RENAL A:   AKI Hyperkalemia-- resolved, now hypokalemic 2/2 diuresis Cardiorenal syndrome P:   - KVO IVF - K 3.3, repleted this morning - Creatine stable from yesterday s/p diuresis  - nephro consulted, appreciate recommendations.    GASTROINTESTINAL A:   No active issues P:   - Continue TB  HEMATOLOGIC A:   No active issues P:  - CBC in AM - Transfuse per ICU protocol  INFECTIOUS A:   Febrile, unknown source CAP P:   - continue rocephin and azithro for CAP - Repeat Blood culture 4/5 >>   ENDOCRINE A:   Hyperglycemia   P:   - ISS - CBGs  NEUROLOGIC A:   Sedated but prior to that neurologic exam was normal. P:   RASS goal: 0 to -2 - D/C Propofol - D/C fentanyl  Jamie Oka, MD Internal Medicine Resident, PGY [facility] Pager: [phone]    06/10/2016, 10:29 AM    ATTENDING NOTE: I have personally reviewed patient's available data, including medical history, events of note, physical examination and test results as part of my evaluation. I have discussed with resident/NP and other careteam providers such as pharmacist, RN and RRT & co-ordinated with consultants. 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  admitted with respiratory distress and fever and required mechanical ventilation since 4/4 Failed extubation/5 due to work of breathing and hypoxia He is being treated with antibiotics for community acquired pneumonia and blood culture showing 1/2 strep viridans which may be a contaminant  On exam-wide awake and alert, able to communicate via writing,  S1-S2 normal, decreased breath sounds bilateral, soft nontender abdomen, no JVD or edema  Chest x-ray shows ET tube in good position and no infiltrates or effusions Labs show mild hypokalemia, increase in creatinine to 3.3, mild thrombocytopenia.  Impression/plan Acute respiratory failure-failed extubation 4/5, likely due to low cardiac output and probably poor lung reserve from right elevated diaphragm, he does have some fracture ribs but no obvious flail chest on exam. Would like to see if his cardiac output improves with inotropes and then consider another extubation attempt  Severe cardiomyopathy-being followed by EP, will involve CHF service will likely benefit with milrinone  Cardiorenal syndrome/ CK D stage IV-does respond somewhat to Lasix, feel that on inotropes he'll have much per response to Lasix   Son and wife updated at bedside [place] per NP/medical resident whose note is outlined above and that I agree with and edited in full.    The patient is critically ill with multiple organ systems failure and requires high complexity decision making for assessment and support, frequent evaluation and titration of therapies, application of advanced monitoring technologies and extensive interpretation of multiple databases. Critical Care Time devoted to patient care services described in this note independent of APP time is 32 minutes.    Rigoberto Noel MD

## 2016-06-10 NOTE — Progress Notes (Signed)
PT Cancellation Note  Patient Details Name: [name] MRN: [id number] DOB: [birth year]   Cancelled Treatment:    Reason Eval/Treat Not Completed: Medical issues which prohibited therapy (Pt with heart issues. Keeping pt sedated. Will f/u Monday. )   [place] 06/10/2016, 3:02 PM  Suheyla Mortellaro,PT Acute Rehabilitation [phone]

## 2016-06-11 DIAGNOSIS — I5023 Acute on chronic systolic (congestive) heart failure: Secondary | ICD-10-CM

## 2016-06-11 LAB — CULTURE, BLOOD (ROUTINE X 2)
CULTURE: NO GROWTH
SPECIAL REQUESTS: ADEQUATE

## 2016-06-11 LAB — GLUCOSE, CAPILLARY
GLUCOSE-CAPILLARY: 130 mg/dL — AB (ref 65–99)
GLUCOSE-CAPILLARY: 114 mg/dL — AB (ref 65–99)
Glucose-Capillary: 118 mg/dL — ABNORMAL HIGH (ref 65–99)
Glucose-Capillary: 123 mg/dL — ABNORMAL HIGH (ref 65–99)
Glucose-Capillary: 113 mg/dL — ABNORMAL HIGH (ref 65–99)

## 2016-06-11 LAB — MAGNESIUM: MAGNESIUM: 2.2 mg/dL (ref 1.7–2.4)

## 2016-06-11 LAB — PARATHYROID HORMONE, INTACT (NO CA): PTH: 74 pg/mL — AB (ref 15–65)

## 2016-06-11 LAB — CBC
HCT: 41.9 % (ref 39.0–52.0)
Hemoglobin: 13.5 g/dL (ref 13.0–17.0)
MCH: 28.7 pg (ref 26.0–34.0)
MCHC: 32.2 g/dL (ref 30.0–36.0)
MCV: 89.1 fL (ref 78.0–100.0)
PLATELETS: 179 10*3/uL (ref 150–400)
RBC: 4.7 MIL/uL (ref 4.22–5.81)
RDW: 14.7 % (ref 11.5–15.5)
WBC: 13.7 10*3/uL — ABNORMAL HIGH (ref 4.0–10.5)

## 2016-06-11 LAB — RENAL FUNCTION PANEL
Albumin: 2.6 g/dL — ABNORMAL LOW (ref 3.5–5.0)
Anion gap: 14 (ref 5–15)
BUN: 79 mg/dL — ABNORMAL HIGH (ref 6–20)
CHLORIDE: 103 mmol/L (ref 101–111)
CO2: 28 mmol/L (ref 22–32)
CREATININE: 3.49 mg/dL — AB (ref 0.61–1.24)
Calcium: 9.1 mg/dL (ref 8.9–10.3)
GFR calc non Af Amer: 18 mL/min — ABNORMAL LOW (ref >60)
GFR, EST AFRICAN AMERICAN: 21 mL/min — AB (ref >60)
Glucose, Bld: 116 mg/dL — ABNORMAL HIGH (ref 65–99)
Phosphorus: 3.1 mg/dL (ref 2.5–4.6)
Potassium: 3.8 mmol/L (ref 3.5–5.1)
Sodium: 145 mmol/L (ref 135–145)

## 2016-06-11 LAB — C3 COMPLEMENT: C3 Complement: 133 mg/dL (ref 82–167)

## 2016-06-11 LAB — C4 COMPLEMENT: Complement C4, Body Fluid: 31 mg/dL (ref 14–44)

## 2016-06-11 LAB — SODIUM, URINE, RANDOM: SODIUM UR: 58 mmol/L

## 2016-06-11 LAB — CREATININE, URINE, RANDOM: Creatinine, Urine: 151.75 mg/dL

## 2016-06-11 MED ORDER — WHITE PETROLATUM GEL
Status: AC
  Filled 2016-06-11: qty 1

## 2016-06-11 MED ORDER — FUROSEMIDE 10 MG/ML PO SOLN
160.0000 mg | Freq: Three times a day (TID) | ORAL | Status: DC
  Filled 2016-06-11: qty 16

## 2016-06-11 MED ORDER — FUROSEMIDE 80 MG PO TABS
160.0000 mg | ORAL_TABLET | Freq: Three times a day (TID) | ORAL | Status: DC
  Administered 2016-06-11 (×2): 160 mg via ORAL
  Filled 2016-06-11 (×3): qty 2

## 2016-06-11 MED ORDER — FUROSEMIDE 10 MG/ML IJ SOLN
80.0000 mg | Freq: Three times a day (TID) | INTRAMUSCULAR | Status: DC
  Administered 2016-06-11 – 2016-06-14 (×8): 80 mg via INTRAVENOUS
  Filled 2016-06-11 (×9): qty 8

## 2016-06-11 NOTE — Progress Notes (Signed)
[name] Progress Note Patient Name: [name] DOB: 12-08-1960 MRN: [id number]   Date of Service  06/11/2016  HPI/Events of Note  Patient is on Lasix 150 mg PO Q 8 hours, however, the patient is NPO. Creatinine = 3.49.  eICU Interventions  Will order: 1. D/C Lasix 150 mg PO every 8 hours. 2. Lasix 80 mg IV every 8 hours.      Intervention Category Minor Interventions: Routine modifications to care plan (e.g. PRN medications for pain, fever)  Sommer,Steven Eugene 06/11/2016, 10:03 PM

## 2016-06-11 NOTE — Progress Notes (Signed)
PULMONARY  / CRITICAL CARE MEDICINE  Name: [name] MRN: [id number] DOB: 04-19-1960    LOS: 75  REFERRING MD :  Dr. Winfred Leeds  CHIEF COMPLAINT:  SOB  BRIEF PATIENT DESCRIPTION: 7M w/ PMHx of CHF, hemidiaphragm s/p paraganglioma resection, CKD, CAD, ICD, and hypothyroidism who presents w/ SOB who was initially tx for CHF exacerbation in the ED and then worked up for sepsis after developing temp of 104.71F while in the ED.   Lines / Drains: ETT>> 4/2 OG>> 4/5  Cultures: Blood cultures x 2 >>4/2-- 1/2 strep viridans  Blood culture >> 4/5  Antibiotics: Vanc >>4/2-4/3 Zosyn >>4/24-4/5 Azithro >>4/5 Rocephin >>4/5  Tests / Events: CXR 4/2 >> mild vascular congestion, stable cardiomegaly CXR 4/5 >> pulmonary vascular congestion    Subjective:  Failed weaning. Poor volumes.  [place].     VITAL SIGNS: Temp:  [97.3 F (36.3 C)-100.1 F (37.8 C)] 99.5 F (37.5 C) (04/07 0831) Pulse Rate:  [87-128] 97 (04/07 0820) Resp:  [20-22] 22 (04/07 0820) BP: (89-113)/(66-101) 102/70 (04/07 0820) SpO2:  [90 %-100 %] 99 % (04/07 0820) FiO2 (%):  [40 %] 40 % (04/07 1001) Weight:  [99.1 kg (218 lb 7.6 oz)] 99.1 kg (218 lb 7.6 oz) (04/07 0422)   HEMODYNAMICS: CVP:  [0 mmHg-7 mmHg] 5 mmHg VENTILATOR SETTINGS: Vent Mode: CPAP;PSV FiO2 (%):  [40 %] 40 % Set Rate:  [22 bmp] 22 bmp Vt Set:  [510 mL] 510 mL PEEP:  [5 cmH20] 5 cmH20 Pressure Support:  [10 cmH20] 10 cmH20 Plateau Pressure:  [20 cmH20-23 cmH20] 20 cmH20  INTAKE / OUTPUT: Intake/Output      04/06 0701 - 04/07 0700 04/07 0701 - 04/08 0700   I.V. (mL/kg) 413.6 (4.2)    NG/GT 1080    IV Piggyback 300    Total Intake(mL/kg) 1793.6 (18.1)    Urine (mL/kg/hr) 1300 (0.5)    Stool 0 (0)    Total Output 1300     Net +493.6          Stool Occurrence 1 x     PHYSICAL EXAMINATION: General:  NAD, alert and interactive Neuro:  RASS 0, moving all extremities  HEENT:  [place]/AT, PERRL, EOM-I and MMM, ETT in  place Cardiovascular:  RRR, no murmurs Lungs:  Good ae. Some crackles at bases.  Abdomen:  Soft, non tender, non distended Skin:  Warm and dry Ext: trace pedal edema  LABS: Cbc  Recent Labs Lab 06/09/16 0534 06/10/16 0638 06/11/16 0500  WBC 13.9* 13.1* 13.7*  HGB 14.6 13.9 13.5  HCT 44.8 41.3 41.9  PLT 153 154 179   Chemistry  Recent Labs Lab 06/09/16 0534 06/09/16 1940 06/10/16 0638 06/11/16 0435  NA 142  --  144 145  K 3.9  --  3.3* 3.8  CL 101  --  103 103  CO2 27  --  26 28  BUN 58*  --  70* 79*  CREATININE 3.74*  --  3.37* 3.49*  CALCIUM 9.0  --  8.7* 9.1  MG 2.3 2.3 2.2 2.2  PHOS 3.7 2.8 2.6 3.1  GLUCOSE 123*  --  135* 116*   Liver fxn  Recent Labs Lab 06/06/16 1816 06/06/16 1925 06/11/16 0435  AST 76* 64*  --   ALT 51 46  --   ALKPHOS 108 90  --   BILITOT 1.4* 1.8*  --   PROT 6.9 6.1*  --   ALBUMIN 3.3* 3.0* 2.6*   coags  Recent Labs Lab  06/06/16 1925  APTT 35  INR 1.22   Sepsis markers  Recent Labs Lab 06/06/16 1650 06/06/16 1925 06/06/16 2116 06/07/16 0057 06/08/16 0431  LATICACIDVEN 5.50* 1.8 1.8  --   --   PROCALCITON  --  1.11  --  5.98 12.28   Cardiac markers  Recent Labs Lab 06/07/16 0057 06/07/16 0748 06/07/16 1416  TROPONINI 0.14* 0.12* 0.17*   BNP No results for input(s): PROBNP in the last 168 hours. ABG  Recent Labs Lab 06/06/16 1650 06/06/16 1800 06/06/16 1940 06/08/16 0350  PHART  --  7.349* 7.467* 7.515*  PCO2ART  --  61.0* 39.4 33.6  PO2ART  --  414.0* 134.0* 129*  HCO3  --  32.5* 27.7 26.9  TCO2 33 34 29  --    CBG trend  Recent Labs Lab 06/10/16 1549 06/10/16 1948 06/10/16 2340 06/11/16 0331 06/11/16 0834  GLUCAP 102* 109* 120* 118* 130*   IMAGING:  ECG:  DIAGNOSES: Active Problems:   Acute respiratory failure with hypoxemia (HCC)   Pressure injury of skin  ASSESSMENT / PLAN:  PULMONARY A: Acute hypoxemic resp failure 2/2 pulmonary edema/CHF + Deconditioning + Poor Lung  reserve History of a paralyzed hemidiaphragm History of Paraganglioma Cardiorenal syndrome P:   - cont vent support. PST, weaning as tolerated.  - cont diuresis - HF team following pt. Appreciate recommendations.    CARDIOVASCULAR A:  Acute on Chronic CHF exacerbation, EF 10% Pulm edema Hypertension P:  - cont diuresis - may need levophed if BP drops with diuresis   RENAL A:   AKI Cardiorenal syndrome P:   - KVO IVF -cont diuresis    GASTROINTESTINAL A:   No active issues P:   - Continue TF   HEMATOLOGIC A:   No active issues P:  - CBC in AM - Transfuse per ICU protocol   INFECTIOUS A:   Febrile, unknown source CAP P:   - continue rocephin and azithro for CAP - F/U cultures   ENDOCRINE A:   Hyperglycemia   P:   - ISS - CBGs  NEUROLOGIC A:   Anxiety P:   RASS goal: 0 to -2 - fentanyl drip + versed prn     I spent  30 minutes of Critical Care time with this patient today.  No family at bedside. Pt updated of plans. Attempt at PST/weaning agin. Needs diuresis.    Monica Becton, MD 06/11/2016, 11:54 AM [facility] Pager [phone] After 3 pm or if no answer, call [phone]

## 2016-06-11 NOTE — Progress Notes (Signed)
Advanced Heart Failure Rounding Note   Subjective:     Remains on vent. Weaning. Anxious to be extubated. SBP improved 105-130. Less tachycardic  CVP 4-5 co-ox 60%. Renal has restarted diuretics. Creatinine up just slightly    Objective:   Weight Range:  Vital Signs:   Temp:  [97.3 F (36.3 C)-100.1 F (37.8 C)] 99.5 F (37.5 C) (04/07 0831) Pulse Rate:  [87-128] 97 (04/07 0820) Resp:  [20-22] 22 (04/07 0820) BP: (89-113)/(66-101) 102/70 (04/07 0820) SpO2:  [90 %-100 %] 99 % (04/07 0820) FiO2 (%):  [40 %] 40 % (04/07 1001) Weight:  [99.1 kg (218 lb 7.6 oz)] 99.1 kg (218 lb 7.6 oz) (04/07 0422) Last BM Date: 06/11/16  Weight change: Filed Weights   06/09/16 0346 06/10/16 0309 06/11/16 0422  Weight: 111.1 kg (244 lb 14.9 oz) 111.1 kg (244 lb 14.9 oz) 99.1 kg (218 lb 7.6 oz)    Intake/Output:   Intake/Output Summary (Last 24 hours) at 06/11/16 1146 Last data filed at 06/11/16 0400  Gross per 24 hour  Intake          1263.55 ml  Output              700 ml  Net           563.55 ml     Physical Exam: General: Intubated. Awake. Communicative HEENT: R eye opaque +ETT Neck: supple. No JVD . Cor: PMI laterally displaced. Tachy regular Lungs: mild rhonchi  Abdomen: soft, nontender, nondistended. No hepatosplenomegaly. No bruits or masses. Good bowel sounds. Extremities: no cyanosis, clubbing, rash, tr edema Neuro: alert & orientedx3, cranial nerves grossly intact. moves all 4 extremities w/o difficulty. Affect pleasant  Telemetry: Sinus tach 100-115 Personally reviewed   Labs: Basic Metabolic Panel:  Recent Labs Lab 06/07/16 0057  06/08/16 0431 06/08/16 1633 06/09/16 0534 06/09/16 1940 06/10/16 0638 06/11/16 0435  NA 139  --  141  --  142  --  144 145  K 3.7  --  4.1  --  3.9  --  3.3* 3.8  CL 98*  --  100*  --  101  --  103 103  CO2 27  --  27  --  27  --  26 28  GLUCOSE 94  --  89  --  123*  --  135* 116*  BUN 48*  --  50*  --  58*  --  70* 79*    CREATININE 2.84*  --  3.66*  --  3.74*  --  3.37* 3.49*  CALCIUM 8.9  --  9.1  --  9.0  --  8.7* 9.1  MG  --   < > 2.1 2.3 2.3 2.3 2.2 2.2  PHOS  --   < > 4.1 5.3* 3.7 2.8 2.6 3.1  < > = values in this interval not displayed.  Liver Function Tests:  Recent Labs Lab 06/06/16 1816 06/06/16 1925 06/11/16 0435  AST 76* 64*  --   ALT 51 46  --   ALKPHOS 108 90  --   BILITOT 1.4* 1.8*  --   PROT 6.9 6.1*  --   ALBUMIN 3.3* 3.0* 2.6*   No results for input(s): LIPASE, AMYLASE in the last 168 hours. No results for input(s): AMMONIA in the last 168 hours.  CBC:  Recent Labs Lab 06/06/16 1644  06/07/16 0057 06/08/16 0431 06/09/16 0534 06/09/16 1025 06/10/16 0638 06/11/16 0500  WBC 9.5  < > 9.5 11.2* 13.9*  --  13.1* 13.7*  NEUTROABS 5.5  --   --   --   --  10.2*  --   --   HGB 16.7  < > 13.3 14.8 14.6  --  13.9 13.5  HCT 50.4  < > 40.0 44.3 44.8  --  41.3 41.9  MCV 90.3  < > 87.9 87.4 88.5  --  87.5 89.1  PLT 232  < > 161 172 153  --  154 179  < > = values in this interval not displayed.  Cardiac Enzymes:  Recent Labs Lab 06/06/16 1925 06/07/16 0057 06/07/16 0748 06/07/16 1416  TROPONINI 0.14* 0.14* 0.12* 0.17*    BNP: BNP (last 3 results)  Recent Labs  06/06/16 1645 06/06/16 1925  BNP 796.0* 553.0*    ProBNP (last 3 results) No results for input(s): PROBNP in the last 8760 hours.    Other results:  Imaging: US Renal  Result Date: 06/10/2016 CLINICAL DATA:  Acute kidney injury EXAM: RENAL / URINARY TRACT ULTRASOUND COMPLETE COMPARISON:  CT 05/26/2015 FINDINGS: Right Kidney: Length: 10.8 cm. Echogenicity within normal limits. No mass or hydronephrosis visualized. Left Kidney: Length: 11.4 cm. Echogenicity within normal limits. No hydronephrosis. 1.7 x 1.6 x 1.2 cm hypoechoic probable cyst in the mid left kidney Bladder: Appears normal for degree of bladder distention. Prostate slightly enlarged and measures 5 x 2.8 x 4.5 cm IMPRESSION: 1. Negative for  hydronephrosis 2. 1.7 cm cyst in the left kidney Electronically Signed   By: Donavan Foil M.D.   On: 06/10/2016 22:28   Dg Chest Port 1 View  Result Date: 06/10/2016 CLINICAL DATA:  Central line placement. EXAM: PORTABLE CHEST 1 VIEW COMPARISON:  Earlier film, same date. FINDINGS: Right IJ center venous catheter tipa is in the distal SVC. No complicating features. The other support apparatus is stable. Mild edema and atelectasis and probable small left effusion. IMPRESSION: Right IJ center venous catheter tip is in the distal SVC. No complicating features. Electronically Signed   By: Marijo Sanes M.D.   On: 06/10/2016 19:08   Dg Chest Port 1 View  Result Date: 06/10/2016 CLINICAL DATA:  Central line placement. EXAM: PORTABLE CHEST 1 VIEW COMPARISON:  Chest x-ray 06/09/2016 FINDINGS: The endotracheal tube is 3.6 cm above the carina. The NG tube is coursing down the esophagus and into the stomach. The pacer wires are stable. No central line is identified. The heart is enlarged but stable. Improved lung aeration with resolving edema and atelectasis. Probable left pleural effusion. IMPRESSION: No central line is identified. Stable endotracheal tube, NG tube and pacer wires. Improved lung aeration. Electronically Signed   By: Marijo Sanes M.D.   On: 06/10/2016 17:46   Dg Chest Port 1 View  Result Date: 06/09/2016 CLINICAL DATA:  Respiratory failure requiring intubation EXAM: PORTABLE CHEST 1 VIEW COMPARISON:  Chest x-ray of 06/09/2016 FINDINGS: The tip of the endotracheal tube is approximately 2.0 cm above the carina. AICD and pacer leads remain. Cardiomegaly is stable. OG tube extends into the stomach. There does appear to be pulmonary vascular congestion present. No pleural effusion is seen. IMPRESSION: 1. Tip of endotracheal tube 2.0 cm above the carina. 2. Cardiomegaly and pulmonary vascular congestion. 3. OG tube extends into the stomach, with the tip not visualized. Electronically Signed   By: Ivar Drape M.D.   On: 06/09/2016 11:59   Dg Abd Portable 1v  Result Date: 06/09/2016 CLINICAL DATA:  Respiratory failure requiring intubation EXAM: PORTABLE ABDOMEN - 1 VIEW COMPARISON:  CT abdomen pelvis of 05/26/2015 FINDINGS: A portable supine film of the abdomen shows some air in the colon but no bowel obstruction is seen. An OG tube is present with the tip in the region of duodenal bulb or descending duodenum. No opaque calculi are seen. IMPRESSION: 1. OG tube tip in the region of the duodenal bulb or descending duodenum. 2. Some colonic gas but no bowel obstruction is seen. Electronically Signed   By: Ivar Drape M.D.   On: 06/09/2016 12:00      Medications:     Scheduled Medications: . azithromycin  500 mg Intravenous Q24H  . cefTRIAXone (ROCEPHIN)  IV  2 g Intravenous Q24H  . chlorhexidine gluconate (MEDLINE KIT)  15 mL Mouth Rinse BID  . Chlorhexidine Gluconate Cloth  6 each Topical Daily  . feeding supplement (VITAL HIGH PROTEIN)  1,000 mL Per Tube Q24H  . fentaNYL (SUBLIMAZE) injection  50 mcg Intravenous Once  . furosemide  160 mg Oral TID  . heparin  5,000 Units Subcutaneous Q8H  . insulin aspart  2-6 Units Subcutaneous Q4H  . mouth rinse  15 mL Mouth Rinse QID  . pantoprazole (PROTONIX) IV  40 mg Intravenous Q24H  . pneumococcal 23 valent vaccine  0.5 mL Intramuscular Tomorrow-1000  . sodium chloride flush  10-40 mL Intracatheter Q12H  . tobramycin-dexamethasone   Right Eye BID     Infusions: . sodium chloride 10 mL/hr at 06/10/16 1203  . sodium chloride 10 mL/hr at 06/10/16 1203  . fentaNYL infusion INTRAVENOUS 50 mcg/hr (06/11/16 0400)     PRN Medications:  albuterol, fentaNYL, midazolam, sodium chloride flush   Assessment:   1. Septic shock 2. Acute hypoxic respiratory failure 3. Acute on chronic renal failure 4. Chronic systolic HF EF 38% on echo 06/09/16 5. Sinus tachycarida  Plan/Discussion:    He is improving. BP is better - no inotrope requirement  currently. Co-ox 60%. CVP 4-5. Renal function seems to be plateauing.  Would continue supportive care. Hopefully can extubate soon.   Follow co-ox and CVP. EF is very low so low threshold to support with inotropes as needed.      Length of Stay: 5   Orel Hord MD 06/11/2016, 11:46 AM  Advanced Heart Failure Team Pager [phone] (M-F; 7a - 4p)  Please contact [facility] for night-coverage after hours (4p -7a ) and weekends on [email or web address]

## 2016-06-11 NOTE — Progress Notes (Signed)
Subjective: Interval History: has no complaint , sedated but coop, .  Objective: Vital signs in last 24 hours: Temp:  [97.3 F (36.3 C)-100.1 F (37.8 C)] 98.2 F (36.8 C) (04/07 0329) Pulse Rate:  [49-128] 91 (04/07 0400) Resp:  [19-22] 22 (04/07 0400) BP: (68-116)/(53-101) 91/72 (04/07 0400) SpO2:  [90 %-100 %] 99 % (04/07 0400) FiO2 (%):  [40 %] 40 % (04/07 0325) Weight:  [99.1 kg (218 lb 7.6 oz)] 99.1 kg (218 lb 7.6 oz) (04/07 0422) Weight change: -12 kg (-26 lb 7.3 oz)  Intake/Output from previous day: 04/06 0701 - 04/07 0700 In: 1793.6 [I.V.:413.6; NG/GT:1080; IV Piggyback:300] Out: 1300 [Urine:1300] Intake/Output this shift: No intake/output data recorded.  General appearance: cooperative, no distress, slowed mentation and entub Resp: rales bibasilar Cardio: S1, S2 normal and systolic murmur: holosystolic 2/6, blowing at apex GI: obese, pos bs,soft Extremities: edema 2-3+  Lab Results:  Recent Labs  06/10/16 0638 06/11/16 0500  WBC 13.1* 13.7*  HGB 13.9 13.5  HCT 41.3 41.9  PLT 154 179   BMET:  Recent Labs  06/10/16 0638 06/11/16 0435  NA 144 145  K 3.3* 3.8  CL 103 103  CO2 26 28  GLUCOSE 135* 116*  BUN 70* 79*  CREATININE 3.37* 3.49*  CALCIUM 8.7* 9.1    Recent Labs  06/10/16 1646  PTH 74*   Iron Studies: No results for input(s): IRON, TIBC, TRANSFERRIN, FERRITIN in the last 72 hours.  Studies/Results: US Renal  Result Date: 06/10/2016 CLINICAL DATA:  Acute kidney injury EXAM: RENAL / URINARY TRACT ULTRASOUND COMPLETE COMPARISON:  CT 05/26/2015 FINDINGS: Right Kidney: Length: 10.8 cm. Echogenicity within normal limits. No mass or hydronephrosis visualized. Left Kidney: Length: 11.4 cm. Echogenicity within normal limits. No hydronephrosis. 1.7 x 1.6 x 1.2 cm hypoechoic probable cyst in the mid left kidney Bladder: Appears normal for degree of bladder distention. Prostate slightly enlarged and measures 5 x 2.8 x 4.5 cm IMPRESSION: 1. Negative  for hydronephrosis 2. 1.7 cm cyst in the left kidney Electronically Signed   By: Donavan Foil M.D.   On: 06/10/2016 22:28   Dg Chest Port 1 View  Result Date: 06/10/2016 CLINICAL DATA:  Central line placement. EXAM: PORTABLE CHEST 1 VIEW COMPARISON:  Earlier film, same date. FINDINGS: Right IJ center venous catheter tipa is in the distal SVC. No complicating features. The other support apparatus is stable. Mild edema and atelectasis and probable small left effusion. IMPRESSION: Right IJ center venous catheter tip is in the distal SVC. No complicating features. Electronically Signed   By: Marijo Sanes M.D.   On: 06/10/2016 19:08   Dg Chest Port 1 View  Result Date: 06/10/2016 CLINICAL DATA:  Central line placement. EXAM: PORTABLE CHEST 1 VIEW COMPARISON:  Chest x-ray 06/09/2016 FINDINGS: The endotracheal tube is 3.6 cm above the carina. The NG tube is coursing down the esophagus and into the stomach. The pacer wires are stable. No central line is identified. The heart is enlarged but stable. Improved lung aeration with resolving edema and atelectasis. Probable left pleural effusion. IMPRESSION: No central line is identified. Stable endotracheal tube, NG tube and pacer wires. Improved lung aeration. Electronically Signed   By: Marijo Sanes M.D.   On: 06/10/2016 17:46   Dg Chest Port 1 View  Result Date: 06/09/2016 CLINICAL DATA:  Respiratory failure requiring intubation EXAM: PORTABLE CHEST 1 VIEW COMPARISON:  Chest x-ray of 06/09/2016 FINDINGS: The tip of the endotracheal tube is approximately 2.0 cm above the carina.  AICD and pacer leads remain. Cardiomegaly is stable. OG tube extends into the stomach. There does appear to be pulmonary vascular congestion present. No pleural effusion is seen. IMPRESSION: 1. Tip of endotracheal tube 2.0 cm above the carina. 2. Cardiomegaly and pulmonary vascular congestion. 3. OG tube extends into the stomach, with the tip not visualized. Electronically Signed   By: Ivar Drape M.D.   On: 06/09/2016 11:59   Dg Chest Port 1 View  Result Date: 06/09/2016 CLINICAL DATA:  Daily check for Vent and CVL patient. EXAM: PORTABLE CHEST 1 VIEW COMPARISON:  06/08/2016 FINDINGS: Patient's endotracheal tube, tip 2.4 cm above the carina. A nasogastric tube is in place with tip beyond the gastroesophageal junction on the film. I such as his pacemaker leads overlie the right ventricle and right atrium. Heart is enlarged. There is mild bibasilar airspace filling. There has been some improvement in aeration of the left lung base with the left hemidiaphragm more apparent. Multiple old rib fractures. IMPRESSION: Slight improvement in aeration of left lung base. Persistent bibasilar airspace filling. Electronically Signed   By: Nolon Nations M.D.   On: 06/09/2016 08:55   Dg Abd Portable 1v  Result Date: 06/09/2016 CLINICAL DATA:  Respiratory failure requiring intubation EXAM: PORTABLE ABDOMEN - 1 VIEW COMPARISON:  CT abdomen pelvis of 05/26/2015 FINDINGS: A portable supine film of the abdomen shows some air in the colon but no bowel obstruction is seen. An OG tube is present with the tip in the region of duodenal bulb or descending duodenum. No opaque calculi are seen. IMPRESSION: 1. OG tube tip in the region of the duodenal bulb or descending duodenum. 2. Some colonic gas but no bowel obstruction is seen. Electronically Signed   By: Ivar Drape M.D.   On: 06/09/2016 12:00    I have reviewed the patient's current medications.  Assessment/Plan: 1 AKI/CKD 3  Vol xs.  Urine chem not useful with Lasix, repeat.  Then restart Lasix.  BP marginal so suspect Cardiorenal.  IgA dz underlying. No change GFR 2 Resp failure multifact.  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

## 2016-06-11 NOTE — Progress Notes (Signed)
Pt calm and cooperative. Tried to wean for some time, but pt became more tachy, "expressed pain" (No problem communicating/write, gestures) Upon assessment of the skin a large abrasion 6x4cm (similar to rug burn) noted on the RT upper back, which was documented as pressure injury or avulsion prior. Cleansed and new dressing applied.

## 2016-06-11 NOTE — Progress Notes (Signed)
  Incisions on RT side of Abdomen (from recent procedure) seems non healing at this time. Slight redness and drainage noted. New dressing applied.

## 2016-06-12 DIAGNOSIS — J9621 Acute and chronic respiratory failure with hypoxia: Secondary | ICD-10-CM

## 2016-06-12 LAB — CBC
HEMATOCRIT: 42.5 % (ref 39.0–52.0)
HEMOGLOBIN: 14.1 g/dL (ref 13.0–17.0)
MCH: 29.6 pg (ref 26.0–34.0)
MCHC: 33.2 g/dL (ref 30.0–36.0)
MCV: 89.3 fL (ref 78.0–100.0)
Platelets: 198 10*3/uL (ref 150–400)
RBC: 4.76 MIL/uL (ref 4.22–5.81)
RDW: 14.5 % (ref 11.5–15.5)
WBC: 15.7 10*3/uL — ABNORMAL HIGH (ref 4.0–10.5)

## 2016-06-12 LAB — RENAL FUNCTION PANEL
ANION GAP: 15 (ref 5–15)
Albumin: 2.7 g/dL — ABNORMAL LOW (ref 3.5–5.0)
BUN: 90 mg/dL — ABNORMAL HIGH (ref 6–20)
CALCIUM: 9.5 mg/dL (ref 8.9–10.3)
CHLORIDE: 101 mmol/L (ref 101–111)
CO2: 29 mmol/L (ref 22–32)
Creatinine, Ser: 3.29 mg/dL — ABNORMAL HIGH (ref 0.61–1.24)
GFR calc non Af Amer: 19 mL/min — ABNORMAL LOW (ref >60)
GFR, EST AFRICAN AMERICAN: 22 mL/min — AB (ref >60)
Glucose, Bld: 117 mg/dL — ABNORMAL HIGH (ref 65–99)
POTASSIUM: 3.9 mmol/L (ref 3.5–5.1)
Phosphorus: 4.2 mg/dL (ref 2.5–4.6)
Sodium: 145 mmol/L (ref 135–145)

## 2016-06-12 LAB — GLUCOSE, CAPILLARY
GLUCOSE-CAPILLARY: 131 mg/dL — AB (ref 65–99)
GLUCOSE-CAPILLARY: 131 mg/dL — AB (ref 65–99)
Glucose-Capillary: 111 mg/dL — ABNORMAL HIGH (ref 65–99)
Glucose-Capillary: 130 mg/dL — ABNORMAL HIGH (ref 65–99)
Glucose-Capillary: 132 mg/dL — ABNORMAL HIGH (ref 65–99)
Glucose-Capillary: 158 mg/dL — ABNORMAL HIGH (ref 65–99)

## 2016-06-12 LAB — COOXEMETRY PANEL
CARBOXYHEMOGLOBIN: 1.6 % — AB (ref 0.5–1.5)
METHEMOGLOBIN: 0.7 % (ref 0.0–1.5)
O2 Saturation: 74.3 %
Total hemoglobin: 15.3 g/dL (ref 12.0–16.0)

## 2016-06-12 LAB — PHOSPHORUS: Phosphorus: 4.1 mg/dL (ref 2.5–4.6)

## 2016-06-12 LAB — MAGNESIUM: MAGNESIUM: 2 mg/dL (ref 1.7–2.4)

## 2016-06-12 MED ORDER — SODIUM CHLORIDE 0.9 % IV SOLN: Freq: Once | INTRAVENOUS | Status: DC

## 2016-06-12 NOTE — Progress Notes (Addendum)
PULMONARY  / CRITICAL CARE MEDICINE  Name: [name] MRN: [id number] DOB: 1960-05-23    LOS: 59  REFERRING MD :  Dr. Winfred Leeds  CHIEF COMPLAINT:  SOB  BRIEF PATIENT DESCRIPTION: 21M w/ PMHx of CHF, hemidiaphragm s/p paraganglioma resection, CKD, CAD, ICD, and hypothyroidism who presents w/ SOB who was initially tx for CHF exacerbation in the ED and then worked up for sepsis after developing temp of 104.66F while in the ED.   Lines / Drains: ETT>> 4/2 OG>> 4/5  Cultures: Blood cultures x 2 >>4/2-- 1/2 strep viridans  Blood culture >> 4/5  Antibiotics: Vanc >>4/2-4/3 Zosyn >>4/24-4/5 Azithro >>4/5 Rocephin >>4/5  Tests / Events: CXR 4/2 >> mild vascular congestion, stable cardiomegaly CXR 4/5 >> pulmonary vascular congestion  Renal U/s 4/6>> neg for hydronephrosis, 1.7cm left kidney cyst ECHO 4/5>> EF 20%   Subjective:  Arousable, denies any pain.     VITAL SIGNS: Temp:  [97.8 F (36.6 C)-99.8 F (37.7 C)] 98.7 F (37.1 C) (04/08 0337) Pulse Rate:  [86-124] 98 (04/08 0600) Resp:  [15-34] 22 (04/08 0600) BP: (91-123)/(63-104) 102/76 (04/08 0600) SpO2:  [94 %-100 %] 100 % (04/08 0600) FiO2 (%):  [40 %] 40 % (04/08 0420) Weight:  [217 lb 6 oz (98.6 kg)] 217 lb 6 oz (98.6 kg) (04/08 0413)   HEMODYNAMICS: CVP:  [2 mmHg-8 mmHg] 5 mmHg VENTILATOR SETTINGS: Vent Mode: PRVC FiO2 (%):  [40 %] 40 % Set Rate:  [22 bmp] 22 bmp Vt Set:  [510 mL] 510 mL PEEP:  [5 cmH20] 5 cmH20 Pressure Support:  [10 cmH20] 10 cmH20 Plateau Pressure:  [20 cmH20-23 cmH20] 21 cmH20  INTAKE / OUTPUT: Intake/Output      04/07 0701 - 04/08 0700   I.V. (mL/kg) 318 (3.2)   NG/GT 1320   IV Piggyback 300   Total Intake(mL/kg) 1938 (19.7)   Urine (mL/kg/hr) 2900 (1.2)   Total Output 2900   Net -962        PHYSICAL EXAMINATION: General:  NAD, well nourished Neuro:  RASS -1, moving all extremities  HEENT:  [place]/AT, PERRL, EOM-I and MMM, ETT in place Cardiovascular:  RRR, no  murmurs Lungs:  Clear on anterior auscultation b/l, no wheezing Abdomen:  Soft, non tender, non distended Skin:  Warm and dry Ext: Jamie pedal edema  LABS: Cbc  Recent Labs Lab 06/10/16 0638 06/11/16 0500 06/12/16 0350  WBC 13.1* 13.7* 15.7*  HGB 13.9 13.5 14.1  HCT 41.3 41.9 42.5  PLT 154 179 198   Chemistry  Recent Labs Lab 06/10/16 0638 06/11/16 0435 06/12/16 0350  NA 144 145 145  K 3.3* 3.8 3.9  CL 103 103 101  CO2 26 28 29   BUN 70* 79* 90*  CREATININE 3.37* 3.49* 3.29*  CALCIUM 8.7* 9.1 9.5  MG 2.2 2.2 2.0  PHOS 2.6 3.1 4.1  4.2  GLUCOSE 135* 116* 117*   Liver fxn  Recent Labs Lab 06/06/16 1816 06/06/16 1925 06/11/16 0435 06/12/16 0350  AST 76* 64*  --   --   ALT 51 46  --   --   ALKPHOS 108 90  --   --   BILITOT 1.4* 1.8*  --   --   PROT 6.9 6.1*  --   --   ALBUMIN 3.3* 3.0* 2.6* 2.7*   coags  Recent Labs Lab 06/06/16 1925  APTT 35  INR 1.22   Sepsis markers  Recent Labs Lab 06/06/16 1650 06/06/16 1925 06/06/16 2116 06/07/16 0057 06/08/16  0431  LATICACIDVEN 5.50* 1.8 1.8  --   --   PROCALCITON  --  1.11  --  5.98 12.28   Cardiac markers  Recent Labs Lab 06/07/16 0057 06/07/16 0748 06/07/16 1416  TROPONINI 0.14* 0.12* 0.17*   BNP No results for input(s): PROBNP in the last 168 hours. ABG  Recent Labs Lab 06/06/16 1650 06/06/16 1800 06/06/16 1940 06/08/16 0350  PHART  --  7.349* 7.467* 7.515*  PCO2ART  --  61.0* 39.4 33.6  PO2ART  --  414.0* 134.0* 129*  HCO3  --  32.5* 27.7 26.9  TCO2 33 34 29  --    CBG trend  Recent Labs Lab 06/11/16 1155 06/11/16 1608 06/11/16 1952 06/12/16 0001 06/12/16 0336  GLUCAP 123* 114* 113* 111* 131*   IMAGING:  ECG:  DIAGNOSES: Active Problems:   Acute respiratory failure with hypoxia (HCC)   Pressure injury of skin  ASSESSMENT / PLAN:  PULMONARY A: Acute hypoxemic resp failure 2/2 pulmonary edema/CHF + Deconditioning + Poor Lung reserve History of a paralyzed  hemidiaphragm History of Paraganglioma Cardiorenal syndrome P:   - cont vent support - cont diuresis - HF team following - discontinued PRN albuterol in case it is contributing to tachycardia.    CARDIOVASCULAR A:  Acute on Chronic CHF exacerbation, EF 10% ECHO 4/5 >> EF 20% Pulm edema Hypertension  P:  - cont diuresis - may need levophed if BP drops with diuresis - HF and EP following  RENAL A:   AKI Cardiorenal syndrome P:   - KVO IVF -cont diuresis    GASTROINTESTINAL A:   No active issues P:   - Continue TF   HEMATOLOGIC A:   No active issues P:  - CBC in AM - Transfuse per ICU protocol   INFECTIOUS A:   Febrile, unknown source CAP P:   - continue rocephin and azithro for CAP - F/U cultures   ENDOCRINE A:   Hyperglycemia   P:   - ISS - CBGs  NEUROLOGIC A:   Anxiety P:   RASS goal: 0 to -2 - fentanyl drip + versed prn   [name], MD Internal Medicine Resident, PGY [facility] Pager: [phone]  ATTENDING NOTE / ATTESTATION NOTE :   I have discussed the case with the resident/APP  Dr. Hulen Luster  I agree with the resident/APP's  history, physical examination, assessment, and plans.    I have edited the above note and modified it according to our agreed history, physical examination, assessment and plan.   Patient admitted with acute on chronic respiratory failure. Remains on the ventilator. Very deconditioned. Awake, follows commands. Vital signs stables although tachycardic. Some crackles at the bases. Good S1 and S2. Abdomen is soft. Grade 1 edema. Continue ventilatory support. Continue weaning trials. Continue cardiac medications. Continue diuresis. Appreciate cardiology recommendations. I'm beginning to anticipate he will need a tracheostomy. Continue tube feeds.  I spent  30  minutes of Critical Care time with this patient today. This is my time spent independent of the APP or resident.    Family   No family at bedside.    Monica Becton, MD 06/12/2016, 8:05 PM [facility] Pager [phone] After 3 pm or if no answer, call [phone]

## 2016-06-12 NOTE — Progress Notes (Signed)
  RN notified of pt temp of 101.8 orally

## 2016-06-12 NOTE — Progress Notes (Signed)
Attempted foley catheter insertion per nephrology order. Had some tightness while inserting, removed foley and noticed few drops of blood. Informed MD. No new orders. Keep monitoring. Gershon Crane

## 2016-06-12 NOTE — Progress Notes (Signed)
Subjective: Interval History: has no complaint , wants off vent.  Objective: Vital signs in last 24 hours: Temp:  [97.8 F (36.6 C)-99.8 F (37.7 C)] 98.7 F (37.1 C) (04/08 0337) Pulse Rate:  [86-124] 98 (04/08 0600) Resp:  [15-34] 22 (04/08 0600) BP: (91-123)/(63-104) 102/76 (04/08 0600) SpO2:  [94 %-100 %] 100 % (04/08 0600) FiO2 (%):  [40 %] 40 % (04/08 0420) Weight:  [98.6 kg (217 lb 6 oz)] 98.6 kg (217 lb 6 oz) (04/08 0413) Weight change: -0.5 kg (-1 lb 1.6 oz)  Intake/Output from previous day: 04/07 0701 - 04/08 0700 In: 1938 [I.V.:318; NG/GT:1320; IV Piggyback:300] Out: 2900 [Urine:2900] Intake/Output this shift: No intake/output data recorded.  General appearance: cooperative, moderately obese, pale, slowed mentation and on vent, awake but sleepy, coop Resp: rales bibasilar Cardio: S1, S2 normal and systolic murmur: holosystolic 2/6, blowing at apex GI: obese, pos bs, liver down 6 cm Extremities: edema 2+  Lab Results:  Recent Labs  06/11/16 0500 06/12/16 0350  WBC 13.7* 15.7*  HGB 13.5 14.1  HCT 41.9 42.5  PLT 179 198   BMET:  Recent Labs  06/11/16 0435 06/12/16 0350  NA 145 145  K 3.8 3.9  CL 103 101  CO2 28 29  GLUCOSE 116* 117*  BUN 79* 90*  CREATININE 3.49* 3.29*  CALCIUM 9.1 9.5    Recent Labs  06/10/16 1646  PTH 74*   Iron Studies: No results for input(s): IRON, TIBC, TRANSFERRIN, FERRITIN in the last 72 hours.  Studies/Results: US Renal  Result Date: 06/10/2016 CLINICAL DATA:  Acute kidney injury EXAM: RENAL / URINARY TRACT ULTRASOUND COMPLETE COMPARISON:  CT 05/26/2015 FINDINGS: Right Kidney: Length: 10.8 cm. Echogenicity within normal limits. No mass or hydronephrosis visualized. Left Kidney: Length: 11.4 cm. Echogenicity within normal limits. No hydronephrosis. 1.7 x 1.6 x 1.2 cm hypoechoic probable cyst in the mid left kidney Bladder: Appears normal for degree of bladder distention. Prostate slightly enlarged and measures 5 x 2.8 x  4.5 cm IMPRESSION: 1. Negative for hydronephrosis 2. 1.7 cm cyst in the left kidney Electronically Signed   By: Donavan Foil M.D.   On: 06/10/2016 22:28   Dg Chest Port 1 View  Result Date: 06/10/2016 CLINICAL DATA:  Central line placement. EXAM: PORTABLE CHEST 1 VIEW COMPARISON:  Earlier film, same date. FINDINGS: Right IJ center venous catheter tipa is in the distal SVC. No complicating features. The other support apparatus is stable. Mild edema and atelectasis and probable small left effusion. IMPRESSION: Right IJ center venous catheter tip is in the distal SVC. No complicating features. Electronically Signed   By: Marijo Sanes M.D.   On: 06/10/2016 19:08   Dg Chest Port 1 View  Result Date: 06/10/2016 CLINICAL DATA:  Central line placement. EXAM: PORTABLE CHEST 1 VIEW COMPARISON:  Chest x-ray 06/09/2016 FINDINGS: The endotracheal tube is 3.6 cm above the carina. The NG tube is coursing down the esophagus and into the stomach. The pacer wires are stable. No central line is identified. The heart is enlarged but stable. Improved lung aeration with resolving edema and atelectasis. Probable left pleural effusion. IMPRESSION: No central line is identified. Stable endotracheal tube, NG tube and pacer wires. Improved lung aeration. Electronically Signed   By: Marijo Sanes M.D.   On: 06/10/2016 17:46    I have reviewed the patient's current medications.  Assessment/Plan: 1 CKD 3/AKI diuresing Cr a little better maybe.  Responding to diuretics 2 VDRF vol, paralyzed diaphragm 3 HTN 4 obesity  5 paraganglioma 6 CAD 7 CM with EF 10. P lasix, may need inotropes , wean when poss ( has failed x 1 ) vent, AB    LOS: 6 days   April Carlyon L 06/12/2016,7:24 AM

## 2016-06-12 NOTE — Progress Notes (Signed)
Advanced Heart Failure Rounding Note   Subjective:     Remains on vent. Failed wean today again. HRs 120s. SBP improved 120-130    CVP 13-14 co-ox 74%. Good diuresis with one dose lasix yesterday. Creatinine improving.    Objective:   Weight Range:  Vital Signs:   Temp:  [97.8 F (36.6 C)-99.2 F (37.3 C)] 99 F (37.2 C) (04/08 1228) Pulse Rate:  [46-123] 110 (04/08 1300) Resp:  [19-29] 22 (04/08 1300) BP: (87-129)/(63-104) 124/83 (04/08 1300) SpO2:  [95 %-100 %] 99 % (04/08 1300) FiO2 (%):  [30 %-40 %] 30 % (04/08 1224) Weight:  [98.6 kg (217 lb 6 oz)] 98.6 kg (217 lb 6 oz) (04/08 0413) Last BM Date: 06/11/16  Weight change: Filed Weights   06/10/16 0309 06/11/16 0422 06/12/16 0413  Weight: 111.1 kg (244 lb 14.9 oz) 99.1 kg (218 lb 7.6 oz) 98.6 kg (217 lb 6 oz)    Intake/Output:   Intake/Output Summary (Last 24 hours) at 06/12/16 1421 Last data filed at 06/12/16 1400  Gross per 24 hour  Intake           1922.5 ml  Output             2600 ml  Net           -677.5 ml     Physical Exam: General: Intubated. Awake  Communicative HEENT: R eye opaque +ETT Neck: supple. LIJ TLC. JVP to jaw Cor: PMI laterally displaced. Tachy regular Lungs: clear anteriorly  Abdomen: obese soft, nontender, nondistended.  Good bowel sounds. Extremities: no cyanosis, clubbing, rash, trace edema warm Neuro: alert & orientedx3, cranial nerves grossly intact. moves all 4 extremities w/o difficulty. Affect pleasant  Telemetry: Sinus tach 100-120 Personally reviewed    Labs: Basic Metabolic Panel:  Recent Labs Lab 06/08/16 0431  06/09/16 0534 06/09/16 1940 06/10/16 0638 06/11/16 0435 06/12/16 0350  NA 141  --  142  --  144 145 145  K 4.1  --  3.9  --  3.3* 3.8 3.9  CL 100*  --  101  --  103 103 101  CO2 27  --  27  --  '26 28 29  '$ GLUCOSE 89  --  123*  --  135* 116* 117*  BUN 50*  --  58*  --  70* 79* 90*  CREATININE 3.66*  --  3.74*  --  3.37* 3.49* 3.29*  CALCIUM 9.1   --  9.0  --  8.7* 9.1 9.5  MG 2.1  < > 2.3 2.3 2.2 2.2 2.0  PHOS 4.1  < > 3.7 2.8 2.6 3.1 4.1  4.2  < > = values in this interval not displayed.  Liver Function Tests:  Recent Labs Lab 06/06/16 1816 06/06/16 1925 06/11/16 0435 06/12/16 0350  AST 76* 64*  --   --   ALT 51 46  --   --   ALKPHOS 108 90  --   --   BILITOT 1.4* 1.8*  --   --   PROT 6.9 6.1*  --   --   ALBUMIN 3.3* 3.0* 2.6* 2.7*   No results for input(s): LIPASE, AMYLASE in the last 168 hours. No results for input(s): AMMONIA in the last 168 hours.  CBC:  Recent Labs Lab 06/06/16 1644  06/08/16 0431 06/09/16 0534 06/09/16 1025 06/10/16 0638 06/11/16 0500 06/12/16 0350  WBC 9.5  < > 11.2* 13.9*  --  13.1* 13.7* 15.7*  NEUTROABS 5.5  --   --   --  10.2*  --   --   --   HGB 16.7  < > 14.8 14.6  --  13.9 13.5 14.1  HCT 50.4  < > 44.3 44.8  --  41.3 41.9 42.5  MCV 90.3  < > 87.4 88.5  --  87.5 89.1 89.3  PLT 232  < > 172 153  --  154 179 198  < > = values in this interval not displayed.  Cardiac Enzymes:  Recent Labs Lab 06/06/16 1925 06/07/16 0057 06/07/16 0748 06/07/16 1416  TROPONINI 0.14* 0.14* 0.12* 0.17*    BNP: BNP (last 3 results)  Recent Labs  06/06/16 1645 06/06/16 1925  BNP 796.0* 553.0*    ProBNP (last 3 results) No results for input(s): PROBNP in the last 8760 hours.    Other results:  Imaging: US Renal  Result Date: 06/10/2016 CLINICAL DATA:  Acute kidney injury EXAM: RENAL / URINARY TRACT ULTRASOUND COMPLETE COMPARISON:  CT 05/26/2015 FINDINGS: Right Kidney: Length: 10.8 cm. Echogenicity within normal limits. No mass or hydronephrosis visualized. Left Kidney: Length: 11.4 cm. Echogenicity within normal limits. No hydronephrosis. 1.7 x 1.6 x 1.2 cm hypoechoic probable cyst in the mid left kidney Bladder: Appears normal for degree of bladder distention. Prostate slightly enlarged and measures 5 x 2.8 x 4.5 cm IMPRESSION: 1. Negative for hydronephrosis 2. 1.7 cm cyst in the  left kidney Electronically Signed   By: Jasmine Pang M.D.   On: 06/10/2016 22:28   Dg Chest Port 1 View  Result Date: 06/10/2016 CLINICAL DATA:  Central line placement. EXAM: PORTABLE CHEST 1 VIEW COMPARISON:  Earlier film, same date. FINDINGS: Right IJ center venous catheter tipa is in the distal SVC. No complicating features. The other support apparatus is stable. Mild edema and atelectasis and probable small left effusion. IMPRESSION: Right IJ center venous catheter tip is in the distal SVC. No complicating features. Electronically Signed   By: Rudie Meyer M.D.   On: 06/10/2016 19:08   Dg Chest Port 1 View  Result Date: 06/10/2016 CLINICAL DATA:  Central line placement. EXAM: PORTABLE CHEST 1 VIEW COMPARISON:  Chest x-ray 06/09/2016 FINDINGS: The endotracheal tube is 3.6 cm above the carina. The NG tube is coursing down the esophagus and into the stomach. The pacer wires are stable. No central line is identified. The heart is enlarged but stable. Improved lung aeration with resolving edema and atelectasis. Probable left pleural effusion. IMPRESSION: No central line is identified. Stable endotracheal tube, NG tube and pacer wires. Improved lung aeration. Electronically Signed   By: Rudie Meyer M.D.   On: 06/10/2016 17:46     Medications:     Scheduled Medications: . azithromycin  500 mg Intravenous Q24H  . cefTRIAXone (ROCEPHIN)  IV  2 g Intravenous Q24H  . chlorhexidine gluconate (MEDLINE KIT)  15 mL Mouth Rinse BID  . Chlorhexidine Gluconate Cloth  6 each Topical Daily  . feeding supplement (VITAL HIGH PROTEIN)  1,000 mL Per Tube Q24H  . fentaNYL (SUBLIMAZE) injection  50 mcg Intravenous Once  . furosemide  80 mg Intravenous Q8H  . heparin  5,000 Units Subcutaneous Q8H  . insulin aspart  2-6 Units Subcutaneous Q4H  . mouth rinse  15 mL Mouth Rinse QID  . pantoprazole (PROTONIX) IV  40 mg Intravenous Q24H  . pneumococcal 23 valent vaccine  0.5 mL Intramuscular Tomorrow-1000  .  sodium chloride flush  10-40 mL Intracatheter Q12H  . tobramycin-dexamethasone   Right Eye BID    Infusions: .  sodium chloride 10 mL/hr at 06/10/16 1203  . sodium chloride 10 mL/hr at 06/10/16 1203  . fentaNYL infusion INTRAVENOUS Stopped (06/12/16 0730)    PRN Medications: fentaNYL, midazolam, sodium chloride flush   Assessment:   1. Septic shock 2. Acute hypoxic respiratory failure 3. Acute on chronic renal failure 4. Chronic systolic HF EF 37% on echo 06/09/16 5. Sinus tachycardia 6. Paralyzed hemidiaphragm  7. h/o paraganglioma resection  Plan/Discussion:    He remains on vent. Failed wean again. Likely multifactorial. Given normal co-ox I am not sure that inotropes will help liberate him from ventilator. Suspect paralyzed hemidiaphragm may be playing a role. Volume status up. Will continue IV lasix in hopes that this may help vent wean. Renal function improving. Nephrology following. Will repeat CXR in am.   Remains on abx for bronchitis/PNA  Remains tachy but it is all sinus tach on tele.    Length of Stay: 6   Kemya Shed MD 06/12/2016, 2:21 PM  Advanced Heart Failure Team Pager [phone] (M-F; [place])  Please contact [facility] for night-coverage after hours (4p -7a ) and weekends on [email or web address]

## 2016-06-12 NOTE — Progress Notes (Signed)
Patient is fully alert and orient, expresses his wish writing down on paper says he is "tired with this condition and wants to die". Informed his wife and son when they visited this evening, also patient said the same thing to them. Wife said she will be here tomorrow morning to meet doctors during rounds.  Sebastian Ache, RN

## 2016-06-13 ENCOUNTER — Inpatient Hospital Stay (HOSPITAL_COMMUNITY): Payer: 59

## 2016-06-13 DIAGNOSIS — I420 Dilated cardiomyopathy: Secondary | ICD-10-CM

## 2016-06-13 DIAGNOSIS — J181 Lobar pneumonia, unspecified organism: Secondary | ICD-10-CM

## 2016-06-13 DIAGNOSIS — I5043 Acute on chronic combined systolic (congestive) and diastolic (congestive) heart failure: Secondary | ICD-10-CM

## 2016-06-13 LAB — GLUCOSE, CAPILLARY
GLUCOSE-CAPILLARY: 139 mg/dL — AB (ref 65–99)
GLUCOSE-CAPILLARY: 132 mg/dL — AB (ref 65–99)
GLUCOSE-CAPILLARY: 144 mg/dL — AB (ref 65–99)
GLUCOSE-CAPILLARY: 129 mg/dL — AB (ref 65–99)
Glucose-Capillary: 141 mg/dL — ABNORMAL HIGH (ref 65–99)
Glucose-Capillary: 147 mg/dL — ABNORMAL HIGH (ref 65–99)
Glucose-Capillary: 149 mg/dL — ABNORMAL HIGH (ref 65–99)

## 2016-06-13 LAB — RENAL FUNCTION PANEL
Albumin: 3 g/dL — ABNORMAL LOW (ref 3.5–5.0)
Anion gap: 17 — ABNORMAL HIGH (ref 5–15)
BUN: 112 mg/dL — ABNORMAL HIGH (ref 6–20)
CHLORIDE: 100 mmol/L — AB (ref 101–111)
CO2: 28 mmol/L (ref 22–32)
Calcium: 9.6 mg/dL (ref 8.9–10.3)
Creatinine, Ser: 3.64 mg/dL — ABNORMAL HIGH (ref 0.61–1.24)
GFR calc non Af Amer: 17 mL/min — ABNORMAL LOW (ref >60)
GFR, EST AFRICAN AMERICAN: 20 mL/min — AB (ref >60)
Glucose, Bld: 142 mg/dL — ABNORMAL HIGH (ref 65–99)
POTASSIUM: 4 mmol/L (ref 3.5–5.1)
Phosphorus: 4.8 mg/dL — ABNORMAL HIGH (ref 2.5–4.6)
Sodium: 145 mmol/L (ref 135–145)

## 2016-06-13 LAB — COOXEMETRY PANEL
CARBOXYHEMOGLOBIN: 1.1 % (ref 0.5–1.5)
METHEMOGLOBIN: 0.8 % (ref 0.0–1.5)
O2 SAT: 64.1 %
TOTAL HEMOGLOBIN: 16.1 g/dL — AB (ref 12.0–16.0)

## 2016-06-13 LAB — PHOSPHORUS: Phosphorus: 4.7 mg/dL — ABNORMAL HIGH (ref 2.5–4.6)

## 2016-06-13 LAB — CBC
HEMATOCRIT: 43.9 % (ref 39.0–52.0)
Hemoglobin: 14.5 g/dL (ref 13.0–17.0)
MCH: 29.5 pg (ref 26.0–34.0)
MCHC: 33 g/dL (ref 30.0–36.0)
MCV: 89.4 fL (ref 78.0–100.0)
Platelets: 205 10*3/uL (ref 150–400)
RBC: 4.91 MIL/uL (ref 4.22–5.81)
RDW: 14.6 % (ref 11.5–15.5)
WBC: 17.6 10*3/uL — AB (ref 4.0–10.5)

## 2016-06-13 LAB — PROCALCITONIN: PROCALCITONIN: 2.73 ng/mL

## 2016-06-13 LAB — MAGNESIUM: Magnesium: 1.9 mg/dL (ref 1.7–2.4)

## 2016-06-13 MED ORDER — PANTOPRAZOLE SODIUM 40 MG PO PACK
40.0000 mg | PACK | Freq: Every day | ORAL | Status: DC
  Administered 2016-06-13 – 2016-06-19 (×7): 40 mg
  Filled 2016-06-13 (×7): qty 20

## 2016-06-13 NOTE — Progress Notes (Signed)
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 P cont furosemide   Subjective: Interval History: Net I/O even  Objective: Vital signs in last 24 hours: Temp:  [97.8 F (36.6 C)-102 F (38.9 C)] 100.9 F (38.3 C) (04/09 0500) Pulse Rate:  [62-123] 109 (04/09 0600) Resp:  [22-29] 22 (04/09 0600) BP: (87-129)/(47-89) 105/75 (04/09 0600) SpO2:  [95 %-100 %] 97 % (04/09 0600) FiO2 (%):  [30 %-31 %] 30 % (04/09 0320) Weight:  [97.5 kg (214 lb 15.2 oz)] 97.5 kg (214 lb 15.2 oz) (04/09 0500) Weight change: -1.1 kg (-2 lb 6.8 oz)  Intake/Output from previous day: 04/08 0701 - 04/09 0700 In: 1837 [I.V.:157; NG/GT:1380; IV Piggyback:300] Out: 1850 [Urine:1850] Intake/Output this shift: No intake/output data recorded.  General appearance: intubated, sedated/sleeping vol excess  Lab Results:  Recent Labs  06/12/16 0350 06/13/16 0504  WBC 15.7* 17.6*  HGB 14.1 14.5  HCT 42.5 43.9  PLT 198 205   BMET:  Recent Labs  06/12/16 0350 06/13/16 0504  NA 145 145  K 3.9 4.0  CL 101 100*  CO2 29 28  GLUCOSE 117* 142*  BUN 90* 112*  CREATININE 3.29* 3.64*  CALCIUM 9.5 9.6    Recent Labs  06/10/16 1646  PTH 74*   Iron Studies: No results for input(s): IRON, TIBC, TRANSFERRIN, FERRITIN in the last 72 hours. Studies/Results: Dg Chest Port 1 View  Result Date: 06/13/2016 CLINICAL DATA:  Worse part or if failure.  Shortness of breath. EXAM: PORTABLE CHEST 1 VIEW COMPARISON:  One-view chest x-ray 06/10/2016 FINDINGS: The heart is enlarged. Patient remains intubated. The endotracheal tube is stable. NG tube courses off the inferior border the film. A right IJ line is in place. Interstitial edema and mild patchy airspace disease is stable. IMPRESSION: 1. Mild edema and patchy airspace disease is stable. 2. Support apparatus is stable. Electronically  Signed   By: San Morelle M.D.   On: 06/13/2016 07:14    Scheduled: . azithromycin  500 mg Intravenous Q24H  . cefTRIAXone (ROCEPHIN)  IV  2 g Intravenous Q24H  . chlorhexidine gluconate (MEDLINE KIT)  15 mL Mouth Rinse BID  . Chlorhexidine Gluconate Cloth  6 each Topical Daily  . feeding supplement (VITAL HIGH PROTEIN)  1,000 mL Per Tube Q24H  . fentaNYL (SUBLIMAZE) injection  50 mcg Intravenous Once  . furosemide  80 mg Intravenous Q8H  . heparin  5,000 Units Subcutaneous Q8H  . insulin aspart  2-6 Units Subcutaneous Q4H  . mouth rinse  15 mL Mouth Rinse QID  . pantoprazole (PROTONIX) IV  40 mg Intravenous Q24H  . pneumococcal 23 valent vaccine  0.5 mL Intramuscular Tomorrow-1000  . sodium chloride flush  10-40 mL Intracatheter Q12H  . tobramycin-dexamethasone   Right Eye BID   Continuous: . sodium chloride 10 mL/hr at 06/10/16 1203  . sodium chloride 10 mL/hr at 06/10/16 1203  . fentaNYL infusion INTRAVENOUS 50 mcg/hr (06/12/16 2106)    LOS: 7 days   Armond Cuthrell C 06/13/2016,7:45 AM

## 2016-06-13 NOTE — Progress Notes (Signed)
Advanced Heart Failure Rounding Note   Subjective:    Awake on vent, HR sinus in the 110's. Co ox 64.1%, not on inotropes. CVP 8  Febrile to 102 overnight, on ceftriaxone/azithro.   Creatinine 3.49->3.29->3.64. BUN significantly higher.  Weight down 3 pounds from yesterday, getting Lasix '80mg'$  TID. Still unable to wean from vent.   Denies pain, can nod appropriately. Has expressed overnight to the nurse that is "tired and wants to die".    Objective:   Weight Range:  Vital Signs:   Temp:  [97.8 F (36.6 C)-102 F (38.9 C)] 100.9 F (38.3 C) (04/09 0500) Pulse Rate:  [62-123] 109 (04/09 0600) Resp:  [22-29] 22 (04/09 0600) BP: (87-129)/(47-89) 105/75 (04/09 0600) SpO2:  [95 %-100 %] 97 % (04/09 0600) FiO2 (%):  [30 %-31 %] 30 % (04/09 0320) Weight:  [214 lb 15.2 oz (97.5 kg)] 214 lb 15.2 oz (97.5 kg) (04/09 0500) Last BM Date: 06/11/16  Weight change: Filed Weights   06/11/16 0422 06/12/16 0413 06/13/16 0500  Weight: 218 lb 7.6 oz (99.1 kg) 217 lb 6 oz (98.6 kg) 214 lb 15.2 oz (97.5 kg)    Intake/Output:   Intake/Output Summary (Last 24 hours) at 06/13/16 0736 Last data filed at 06/13/16 0600  Gross per 24 hour  Intake             1837 ml  Output             1850 ml  Net              -13 ml     Physical Exam: General: Intubated, uses writing board to communicate.  HEENT: R eye opaque. ETT in place.  Neck: supple. LIJ TLC. JVP 8 cm  Cor: PMI laterally displaced, regular rate and rhythm. Tachy.  Lungs: Rhonchi in upper lobes.  Abdomen: obese soft, nontender, nondistended.  Good bowel sounds. Extremities: no cyanosis, clubbing, rash,Trace edema.  Neuro: alert & orientedx3, cranial nerves grossly intact. moves all 4 extremities w/o difficulty. Affect pleasant  Telemetry: Sinus tach 100-120 Personally reviewed    Labs: Basic Metabolic Panel:  Recent Labs Lab 06/09/16 0534 06/09/16 1940 06/10/16 0638 06/11/16 0435 06/12/16 0350 06/13/16 0504  NA  142  --  144 145 145 145  K 3.9  --  3.3* 3.8 3.9 4.0  CL 101  --  103 103 101 100*  CO2 27  --  '26 28 29 28  '$ GLUCOSE 123*  --  135* 116* 117* 142*  BUN 58*  --  70* 79* 90* 112*  CREATININE 3.74*  --  3.37* 3.49* 3.29* 3.64*  CALCIUM 9.0  --  8.7* 9.1 9.5 9.6  MG 2.3 2.3 2.2 2.2 2.0 1.9  PHOS 3.7 2.8 2.6 3.1 4.1  4.2 4.7*  4.8*    Liver Function Tests:  Recent Labs Lab 06/06/16 1816 06/06/16 1925 06/11/16 0435 06/12/16 0350 06/13/16 0504  AST 76* 64*  --   --   --   ALT 51 46  --   --   --   ALKPHOS 108 90  --   --   --   BILITOT 1.4* 1.8*  --   --   --   PROT 6.9 6.1*  --   --   --   ALBUMIN 3.3* 3.0* 2.6* 2.7* 3.0*    CBC:  Recent Labs Lab 06/06/16 1644  06/09/16 0534 06/09/16 1025 06/10/16 0638 06/11/16 0500 06/12/16 0350 06/13/16 0504  WBC 9.5  < >  13.9*  --  13.1* 13.7* 15.7* 17.6*  NEUTROABS 5.5  --   --  10.2*  --   --   --   --   HGB 16.7  < > 14.6  --  13.9 13.5 14.1 14.5  HCT 50.4  < > 44.8  --  41.3 41.9 42.5 43.9  MCV 90.3  < > 88.5  --  87.5 89.1 89.3 89.4  PLT 232  < > 153  --  154 179 198 205  < > = values in this interval not displayed.  Cardiac Enzymes:  Recent Labs Lab 06/06/16 1925 06/07/16 0057 06/07/16 0748 06/07/16 1416  TROPONINI 0.14* 0.14* 0.12* 0.17*    BNP: BNP (last 3 results)  Recent Labs  06/06/16 1645 06/06/16 1925  BNP 796.0* 553.0*    ProBNP (last 3 results) No results for input(s): PROBNP in the last 8760 hours.   Transthoracic Echocardiography 06/10/16 Study Conclusions  - Left ventricle: The cavity size was severely dilated. Wall   thickness was normal. The estimated ejection fraction was 20%.   Diffuse hypokinesis. - Aortic valve: There was mild regurgitation. - Mitral valve: There was moderate regurgitation. - Left atrium: The atrium was moderately dilated. - Atrial septum: No defect or patent foramen ovale was identified.    Imaging: Dg Chest Port 1 View  Result Date: 06/13/2016 CLINICAL  DATA:  Worse part or if failure.  Shortness of breath. EXAM: PORTABLE CHEST 1 VIEW COMPARISON:  One-view chest x-ray 06/10/2016 FINDINGS: The heart is enlarged. Patient remains intubated. The endotracheal tube is stable. NG tube courses off the inferior border the film. A right IJ line is in place. Interstitial edema and mild patchy airspace disease is stable. IMPRESSION: 1. Mild edema and patchy airspace disease is stable. 2. Support apparatus is stable. Electronically Signed   By: Marin Roberts M.D.   On: 06/13/2016 07:14     Medications:     Scheduled Medications: . azithromycin  500 mg Intravenous Q24H  . cefTRIAXone (ROCEPHIN)  IV  2 g Intravenous Q24H  . chlorhexidine gluconate (MEDLINE KIT)  15 mL Mouth Rinse BID  . Chlorhexidine Gluconate Cloth  6 each Topical Daily  . feeding supplement (VITAL HIGH PROTEIN)  1,000 mL Per Tube Q24H  . fentaNYL (SUBLIMAZE) injection  50 mcg Intravenous Once  . furosemide  80 mg Intravenous Q8H  . heparin  5,000 Units Subcutaneous Q8H  . insulin aspart  2-6 Units Subcutaneous Q4H  . mouth rinse  15 mL Mouth Rinse QID  . pantoprazole (PROTONIX) IV  40 mg Intravenous Q24H  . pneumococcal 23 valent vaccine  0.5 mL Intramuscular Tomorrow-1000  . sodium chloride flush  10-40 mL Intracatheter Q12H  . tobramycin-dexamethasone   Right Eye BID    Infusions: . sodium chloride 10 mL/hr at 06/10/16 1203  . sodium chloride 10 mL/hr at 06/10/16 1203  . fentaNYL infusion INTRAVENOUS 50 mcg/hr (06/12/16 2106)    PRN Medications: fentaNYL, midazolam, sodium chloride flush   Assessment:   1. Septic shock: Remains febrile, ?source => CAP possibly.  2. Acute hypoxic respiratory failure 3. Acute on chronic renal failure 4. Chronic systolic HF EF 20% on echo 06/09/16 5. Sinus tachycardia 6. Paralyzed hemidiaphragm  7. h/o paraganglioma resection  Plan/Discussion:    Remains on abx for bronchitis/PNA. Co ox stable at 64%, MD yesterday felt that his  failure to wean from vent is multifactorial and his paralyzed hemidiaphragm may be playing a role.   His creatinine has  bumped today, 3.29->3.64. Nephrology following and dosing diuretics. Volume status stable but tenuous with low EF. BP too soft to add any other cardiac meds.   Length of Stay: [place] NP 06/13/2016, 7:36 AM  Advanced Heart Failure Team  Pager [phone] M-F 7am-4pm.  Please contact [facility] for night-coverage after hours (4p -7a ) and weekends on [email or web address]  Patient seen with NP, agree with the above note.  Co-ox has been > 60% last three checks.  Not suggestive of low output state. CVP is 8 today so he does not appear markedly volume overloaded.  However, unable to wean from vent so far.  He had a fever to 102 overnight and WBCs are climbing.  I am concerned that PNA is the reason he is not weaning well from vent.  Would be careful with further diuresis given CVP 8 and BUN rising (consider cutting back).    Loralie Champagne 06/13/2016

## 2016-06-13 NOTE — Progress Notes (Signed)
PULMONARY  / CRITICAL CARE MEDICINE  Name: [name] MRN: [id number] DOB: Feb 26, 1961    LOS: 32  REFERRING MD :  Dr. Winfred Leeds  CHIEF COMPLAINT:  SOB  BRIEF PATIENT DESCRIPTION: 2M w/ PMHx of CHF, hemidiaphragm s/p paraganglioma resection, CKD, CAD, ICD, and hypothyroidism who presents w/ SOB who was initially tx for CHF exacerbation in the ED and then worked up for sepsis after developing temp of 104.84F while in the ED.   Lines / Drains: ETT>> 4/2 OG>> 4/5  Cultures: Blood cultures x 2 >>4/2-- 1/2 strep viridans  Blood culture >> 4/5  Antibiotics: Vanc >>4/2-4/3 Zosyn >>4/24-4/5 Azithro >>4/5 Rocephin >>4/5  Tests / Events: CXR 4/2 >> mild vascular congestion, stable cardiomegaly CXR 4/5 >> pulmonary vascular congestion  Renal U/s 4/6>> neg for hydronephrosis, 1.7cm left kidney cyst ECHO 4/5>> EF 20% CXR 4/9 >> Mild edema and patchy airspace disease is stable.   Subjective:  Still having low grade fevers. Alert on vent. NAD  VITAL SIGNS: Temp:  [97.8 F (36.6 C)-102 F (38.9 C)] 100.9 F (38.3 C) (04/09 0500) Pulse Rate:  [62-123] 109 (04/09 0600) Resp:  [22-29] 22 (04/09 0600) BP: (87-129)/(47-89) 105/75 (04/09 0600) SpO2:  [95 %-100 %] 97 % (04/09 0600) FiO2 (%):  [30 %-31 %] 30 % (04/09 0320) Weight:  [214 lb 15.2 oz (97.5 kg)] 214 lb 15.2 oz (97.5 kg) (04/09 0500)   HEMODYNAMICS: CVP:  [2 mmHg-9 mmHg] 9 mmHg VENTILATOR SETTINGS: Vent Mode: PRVC FiO2 (%):  [30 %-31 %] 30 % Set Rate:  [22 bmp] 22 bmp Vt Set:  [510 mL] 510 mL PEEP:  [5 cmH20] 5 cmH20 Pressure Support:  [14 cmH20] 14 cmH20 Plateau Pressure:  [20 cmH20-21 cmH20] 20 cmH20  INTAKE / OUTPUT: Intake/Output      04/08 0701 - 04/09 0700 04/09 0701 - 04/10 0700   I.V. (mL/kg) 157 (1.6)    NG/GT 1380    IV Piggyback 300    Total Intake(mL/kg) 1837 (18.8)    Urine (mL/kg/hr) 1850 (0.8)    Total Output 1850     Net -13           PHYSICAL EXAMINATION: General:  NAD, well  nourished Neuro:  RASS -1, moving all extremities  HEENT:  [place]/AT, PERRL, EOM-I and MMM, ETT in place Cardiovascular:  RRR, no murmurs Lungs:  Clear on anterior auscultation b/l, no wheezing Abdomen:  Soft, non tender, non distended Skin:  Warm and dry, ICD pocket non tender, no fluctuance Ext: trace pedal edema  LABS: Cbc  Recent Labs Lab 06/11/16 0500 06/12/16 0350 06/13/16 0504  WBC 13.7* 15.7* 17.6*  HGB 13.5 14.1 14.5  HCT 41.9 42.5 43.9  PLT 179 198 205   Chemistry  Recent Labs Lab 06/11/16 0435 06/12/16 0350 06/13/16 0504  NA 145 145 145  K 3.8 3.9 4.0  CL 103 101 100*  CO2 28 29 28   BUN 79* 90* 112*  CREATININE 3.49* 3.29* 3.64*  CALCIUM 9.1 9.5 9.6  MG 2.2 2.0 1.9  PHOS 3.1 4.1  4.2 4.7*  4.8*  GLUCOSE 116* 117* 142*   Liver fxn  Recent Labs Lab 06/06/16 1816 06/06/16 1925 06/11/16 0435 06/12/16 0350 06/13/16 0504  AST 76* 64*  --   --   --   ALT 51 46  --   --   --   ALKPHOS 108 90  --   --   --   BILITOT 1.4* 1.8*  --   --   --  PROT 6.9 6.1*  --   --   --   ALBUMIN 3.3* 3.0* 2.6* 2.7* 3.0*   coags  Recent Labs Lab 06/06/16 1925  APTT 35  INR 1.22   Sepsis markers  Recent Labs Lab 06/06/16 1650 06/06/16 1925 06/06/16 2116 06/07/16 0057 06/08/16 0431  LATICACIDVEN 5.50* 1.8 1.8  --   --   PROCALCITON  --  1.11  --  5.98 12.28   Cardiac markers  Recent Labs Lab 06/07/16 0057 06/07/16 0748 06/07/16 1416  TROPONINI 0.14* 0.12* 0.17*   BNP No results for input(s): PROBNP in the last 168 hours. ABG  Recent Labs Lab 06/06/16 1650 06/06/16 1800 06/06/16 1940 06/08/16 0350  PHART  --  7.349* 7.467* 7.515*  PCO2ART  --  61.0* 39.4 33.6  PO2ART  --  414.0* 134.0* 129*  HCO3  --  32.5* 27.7 26.9  TCO2 33 34 29  --    CBG trend  Recent Labs Lab 06/12/16 1231 06/12/16 1634 06/12/16 1940 06/13/16 0008 06/13/16 0359  GLUCAP 130* 131* 158* 147* 141*   IMAGING:  ECG:  DIAGNOSES: Active Problems:   Acute  respiratory failure with hypoxia (HCC)   Pressure injury of skin   Acute respiratory failure with hypoxemia (HCC)  ASSESSMENT / PLAN:  PULMONARY A: Acute hypoxemic resp failure 2/2 pulmonary edema/CHF + Deconditioning + Poor Lung reserve History of a paralyzed hemidiaphragm History of Paraganglioma Cardiorenal syndrome P:   - cont vent support, start weaning - cont diuresis - HF team following - discontinued PRN albuterol in case it is contributing to tachycardia.    CARDIOVASCULAR A:  Acute on Chronic CHF exacerbation, EF 10% ECHO 4/5 >> EF 20% Pulm edema Hypertension  P:  - cont diuresis - HF and EP following  RENAL A:   AKI Cardiorenal syndrome P:   - KVO IVF -cont diuresis    GASTROINTESTINAL A:   No active issues P:   - Continue TF   HEMATOLOGIC A:   No active issues P:  - CBC in AM - Transfuse per ICU protocol   INFECTIOUS A:   Febrile, unknown source CAP P:   - on rocephin, completed course of azithromycin - follow blood cultures - trend pct, ordered respiratory culture   ENDOCRINE A:   Hyperglycemia   P:   - ISS - CBGs  NEUROLOGIC A:   Anxiety P:   RASS goal: 0 to -2 - fentanyl drip + versed prn   [name], MD Internal Medicine Resident, PGY [facility] Pager: [phone]

## 2016-06-13 NOTE — Progress Notes (Signed)
ATTENDING NOTE: I have personally reviewed patient's available data, including medical history, events of note, physical examination and test results as part of my evaluation. I have discussed with resident/NP and other careteam providers such as pharmacist, RN and RRT & co-ordinated with consultants. 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 He continues to have fevers to 102  On exam-awake and alert, S1-S2 normal, decreased breath sounds bilateral, soft nontender abdomen, no JVD or edema  Chest x-ray shows ET tube in good position and ? Right middle lobe infiltrate, no effusions Labs show mild hypokalemia, increase in creatinine to 3.6, mild thrombocytopenia.  Impression/plan Acute respiratory failure-failed extubation 4/5, likely due to low cardiac output and probably poor lung reserve from right elevated diaphragm, he does have some fracture ribs but no obvious flail chest on exam.  Continue spontaneous breathing trials, would consider another trial of extubation if he meets criteria  Severe cardiomyopathy- Co- oximetry normal range, milrinone deferred, repeat echo shows EF 20%  Cardiorenal syndrome/ CK D stage IV-does respond somewhat to Lasix  Persistent fever and leukocytosis- repeat pro-calcitonin, if more than 10, will broaden antibiotics to Zosyn and vancomycin, obtain respiratory culture  - wife updated at bedside [place] per NP/medical resident whose note is outlined above and that I agree with and edited in full.    The patient is critically  ill with multiple organ systems failure and requires high complexity decision making for assessment and support, frequent evaluation and titration of therapies, application of advanced monitoring technologies and extensive interpretation of multiple databases. Critical Care Time devoted to patient care services described in this note independent of APP time is 32 minutes.   Rigoberto Noel MD

## 2016-06-13 NOTE — Progress Notes (Signed)
PT Cancellation Note  Patient Details Name: [name] MRN: [id number] DOB: [birth year]   Cancelled Treatment:    Reason Eval/Treat Not Completed: Medical issues which prohibited therapy. RN deferred OOB mobility at this time due to pt in process of being weaned off vent. PT to return as able.   Salisha Bardsley M Tomma Ehinger 06/13/2016, 12:48 PM   Kittie Plater, PT, DPT Pager #: [phone] Office #: [phone]

## 2016-06-13 NOTE — Procedures (Signed)
Pt placed back on full support at this time due to increased WOB & inc RR >35, pt tolerating full support well, RT will monitor

## 2016-06-14 DIAGNOSIS — T827XXA Infection and inflammatory reaction due to other cardiac and vascular devices, implants and grafts, initial encounter: Secondary | ICD-10-CM

## 2016-06-14 DIAGNOSIS — B955 Unspecified streptococcus as the cause of diseases classified elsewhere: Secondary | ICD-10-CM

## 2016-06-14 DIAGNOSIS — Z9289 Personal history of other medical treatment: Secondary | ICD-10-CM

## 2016-06-14 DIAGNOSIS — J9690 Respiratory failure, unspecified, unspecified whether with hypoxia or hypercapnia: Secondary | ICD-10-CM

## 2016-06-14 DIAGNOSIS — T80219A Unspecified infection due to central venous catheter, initial encounter: Secondary | ICD-10-CM

## 2016-06-14 DIAGNOSIS — R7881 Bacteremia: Secondary | ICD-10-CM

## 2016-06-14 DIAGNOSIS — I5022 Chronic systolic (congestive) heart failure: Secondary | ICD-10-CM

## 2016-06-14 DIAGNOSIS — Z95810 Presence of automatic (implantable) cardiac defibrillator: Secondary | ICD-10-CM

## 2016-06-14 DIAGNOSIS — J986 Disorders of diaphragm: Secondary | ICD-10-CM

## 2016-06-14 DIAGNOSIS — R0602 Shortness of breath: Secondary | ICD-10-CM

## 2016-06-14 LAB — CULTURE, BLOOD (ROUTINE X 2)
CULTURE: NO GROWTH
Culture: NO GROWTH
SPECIAL REQUESTS: ADEQUATE
Special Requests: ADEQUATE

## 2016-06-14 LAB — CBC
HCT: 43.7 % (ref 39.0–52.0)
Hemoglobin: 14.4 g/dL (ref 13.0–17.0)
MCH: 29.4 pg (ref 26.0–34.0)
MCHC: 33 g/dL (ref 30.0–36.0)
MCV: 89.2 fL (ref 78.0–100.0)
Platelets: 230 10*3/uL (ref 150–400)
RBC: 4.9 MIL/uL (ref 4.22–5.81)
RDW: 14.6 % (ref 11.5–15.5)
WBC: 18.5 10*3/uL — ABNORMAL HIGH (ref 4.0–10.5)

## 2016-06-14 LAB — RENAL FUNCTION PANEL
ALBUMIN: 2.9 g/dL — AB (ref 3.5–5.0)
Anion gap: 18 — ABNORMAL HIGH (ref 5–15)
BUN: 132 mg/dL — AB (ref 6–20)
CALCIUM: 9.9 mg/dL (ref 8.9–10.3)
CO2: 29 mmol/L (ref 22–32)
CREATININE: 3.78 mg/dL — AB (ref 0.61–1.24)
Chloride: 99 mmol/L — ABNORMAL LOW (ref 101–111)
GFR calc Af Amer: 19 mL/min — ABNORMAL LOW (ref >60)
GFR, EST NON AFRICAN AMERICAN: 16 mL/min — AB (ref >60)
GLUCOSE: 154 mg/dL — AB (ref 65–99)
PHOSPHORUS: 6 mg/dL — AB (ref 2.5–4.6)
POTASSIUM: 3.8 mmol/L (ref 3.5–5.1)
SODIUM: 146 mmol/L — AB (ref 135–145)

## 2016-06-14 LAB — COOXEMETRY PANEL
Carboxyhemoglobin: 1.8 % — ABNORMAL HIGH (ref 0.5–1.5)
METHEMOGLOBIN: 0.6 % (ref 0.0–1.5)
O2 Saturation: 64.6 %
TOTAL HEMOGLOBIN: 15 g/dL (ref 12.0–16.0)

## 2016-06-14 LAB — GLUCOSE, CAPILLARY
GLUCOSE-CAPILLARY: 183 mg/dL — AB (ref 65–99)
GLUCOSE-CAPILLARY: 170 mg/dL — AB (ref 65–99)
Glucose-Capillary: 156 mg/dL — ABNORMAL HIGH (ref 65–99)
Glucose-Capillary: 160 mg/dL — ABNORMAL HIGH (ref 65–99)
Glucose-Capillary: 170 mg/dL — ABNORMAL HIGH (ref 65–99)
Glucose-Capillary: 171 mg/dL — ABNORMAL HIGH (ref 65–99)

## 2016-06-14 LAB — MAGNESIUM: Magnesium: 2.1 mg/dL (ref 1.7–2.4)

## 2016-06-14 LAB — PROCALCITONIN: Procalcitonin: 3.12 ng/mL

## 2016-06-14 MED ORDER — CARVEDILOL 6.25 MG PO TABS
6.2500 mg | ORAL_TABLET | Freq: Two times a day (BID) | ORAL | Status: DC
  Filled 2016-06-14: qty 1

## 2016-06-14 MED ORDER — CARVEDILOL 6.25 MG PO TABS: 6.2500 mg | ORAL_TABLET | Freq: Two times a day (BID) | ORAL | Status: DC

## 2016-06-14 MED ORDER — CARVEDILOL 6.25 MG PO TABS
6.2500 mg | ORAL_TABLET | Freq: Two times a day (BID) | ORAL | Status: DC
  Administered 2016-06-14 – 2016-06-21 (×13): 6.25 mg via ORAL
  Filled 2016-06-14 (×15): qty 1

## 2016-06-14 MED ORDER — SODIUM CHLORIDE 0.9 % IV BOLUS (SEPSIS)
250.0000 mL | Freq: Once | INTRAVENOUS | Status: AC
  Administered 2016-06-14: 250 mL via INTRAVENOUS

## 2016-06-14 NOTE — Consult Note (Addendum)
Date of Admission:  06/06/2016  Date of Consult:  06/14/2016  Reason for Consult: fevers, sepsis, viridans streptococci on blood culture Referring Physician: Dr. Titus Mould   HPI: [name] is an 56 y.o. male  CAD, chronic systolic heart failure, VT, BiV ICD, bladder cancer, paraganglioma resected 2015, h/o paralyzed diaphragm who was admitted to the ICU acute respiratory failure and sepsis. He had apparently been picking up his wife on the day of admission when he developed shortness of breath and diaphoresis. He came to the emergency department where he required oxygen at 6 L but still not satting well placed on BiPAP still not improving even with diuresis and IV nitroglycerin. It became with progressive respiration failure were our intubation. He began febrile to 104.5 and a code sepsis was called. Blood cultures were drawn and he was started on Zosyn and vancomycin. He was admitted to the ICU and remained on the ventilator. Since then blood cultures come back with a viridans streptococcus growing in one blood culture but not the second blood culture taken on admission. I was called by the patient's of Dr. physiologist Jolyn Nap regarding the positive blood culture and whether or not we should be concerned about potential endovascular infection and specifically also device infection. In seeing the 2 blood cultures and finding that one was a viridans streptococcal species my thought was that which the lab had reported and that this was likely a contaminant based on the fact it was only isolate in one culture not the other. Patient however is remained persistently febrile despite appropriate antibiotics and with worsening leukocytosis. We were reconsulted by Dr. Titus Mould and I reexamined the patient's record. In reviewing the events that occurred with the code sepsis appears that the patient did have his first blood culture drawn at 1750 hrs. He then was started on IV Zosyn at 1754 which  was stopped at 1820. Second set of blood cultures was done at 1821 after the infusion albeit partial infusion of Zosyn. These blood cultures failed to grow an organism but as I have described were taken in the construct of IV antibiotics having been infused into the bloodstream. Therefore we have to reconsider that the initial blood culture that was positive for viridans Streptococcus may be a true positive and a second culture was negative not because was a true negative but rather a false negative in the context of antecedent antibiotics.  I've asked the microbiology lab to now workup to viridans streptococcus was they still haven't since susceptibilities for penicillin and ceftriaxone. As well as vancomycin.  I also talked to Dr. Titus Mould and Dr. Caryl Comes. We now need to get a transesophageal echocardiogram to evaluate the patient's valves and to evaluate his device. We are going to need to consider whether he is going to need explantation of his device. He also has multiple central lines which complicate the matter further.  Past Medical History:  Diagnosis Date  . Arthritis    GOUT  . Automatic implantable cardioverter-defibrillator in situ   . Biventricular ICD (implantable cardiac defibrillator) [employer]onic ]    DOI 2008/ upgrade 2010/ Gen Change 2014  . Bladder tumor    PT HOSP AT [facility] 4/24 TO 4/26 2014 WITH UTI--AND FOUND TO HAVE BLADDER TUMOR-  . Cardiomyopathy    Idiopathic dilated;   . CHF (congestive heart failure) ([place])   . CKD (chronic kidney disease)    stage III baseline Crt 1.8-2.0  . Coronary artery disease   .  Gout 08/03/12   PT C/O OF RIGHT KNEE PAIN AND SWELLING -STATES GOUT FLARE UP - ONGOING SINCE APRIL - BUT SWELLING W/IN LAST WEEK  . Hilar mass    Noted CT 2010  . HTN (hypertension)    Dr. Caryl Comes cardiologist  . Hypothyroidism   . Paraganglioma ([place])    //neuroendocrine tumor of the right chest per notes 02/10/2014  . Sleep apnea    CPAP  . Systolic CHF ([place])    EF  15%  . Ventricular tachycardia [facility])    s/p ICD    Past Surgical History:  Procedure Laterality Date  . BIV ICD GENERTAOR CHANGE OUT     DOI 2008/ upgrade 2010/ Gen Change 2014   . CARDIAC CATHETERIZATION     he was found to have normal coronary arteries but with a globally dilated and hypocontractile heart  . CARDIAC DEFIBRILLATOR PLACEMENT     DOI 2008/ upgrade 2010/ Gen Change 2014   . CHEST TUBE INSERTION  09/10/2013  . CHOLECYSTECTOMY    . cornea replacement    . ENDOBRONCHIAL ULTRASOUND Bilateral 10/01/2012   Procedure: ENDOBRONCHIAL ULTRASOUND;  Surgeon: Collene Gobble, MD;  Location: [facility] ENDOSCOPY;  Service: Cardiopulmonary;  Laterality: Bilateral;  . IMPLANTABLE CARDIOVERTER DEFIBRILLATOR (ICD) GENERATOR CHANGE N/A 05/02/2012   Procedure: ICD GENERATOR CHANGE;  Surgeon: Deboraha Sprang, MD;  Location: [facility] CATH LAB;  Service: Cardiovascular;  Laterality: N/A;  . MEDIASTINOSCOPY N/A 10/25/2012   Procedure: MEDIASTINOSCOPY;  Surgeon: [name], MD;  Location: [place];  Service: Thoracic;  Laterality: N/A;  . MEDIASTINOTOMY  09/10/2013   paratracheal mass    DR Darcey Nora  . MEDIASTINOTOMY CHAMBERLAIN MCNEIL Right 09/10/2013   Procedure: MEDIASTINOTOMY CHAMBERLAIN MCNEIL;  Surgeon: [name], MD;  Location: [place];  Service: Thoracic;  Laterality: Right;  . RESECTION OF MEDIASTINAL MASS Right 02/13/2014   Procedure: RESECTION OF MEDIASTINAL MASS;  Surgeon: [name], MD;  Location: [facility] OR;  Service: Thoracic;  Laterality: Right;  PATIENT NEEDS EPIDURAL CATHETER AND A SWAN GANZ CATHETER (DR. CHRIS MOSER AWARE PER PVT)  . RIGHT HEART CATHETERIZATION N/A 01/27/2012   Procedure: RIGHT HEART CATH;  Surgeon: Jolaine Artist, MD;  Location: [facility] CATH LAB;  Service: Cardiovascular;  Laterality: N/A;  . TONSILLECTOMY    . TRANSURETHRAL RESECTION OF BLADDER TUMOR N/A 08/13/2012   Procedure: TRANSURETHRAL RESECTION OF BLADDER TUMOR (TURBT);  Surgeon: Claybon Jabs, MD;  Location: [facility]  ORS;  Service: Urology;  Laterality: N/A;  MITOMYCIN C  . US ECHOCARDIOGRAPHY  03-17-2008, 07-03-2006   EF 15-20%, EF 15-20%  . VIDEO ASSISTED THORACOSCOPY (VATS)/THOROCOTOMY Right 02/13/2014   Procedure: VIDEO ASSISTED THORACOSCOPY (VATS)/THOROCOTOMY;  Surgeon: [name], MD;  Location[facility] OR;  Service: Thoracic;  Laterality: Right;  PATIENT NEEDS EPIDURAL CATHETER (DR. CHRIS MOSER AWARE PER PVT)  . VIDEO BRONCHOSCOPY N/A 10/25/2012   Procedure: VIDEO BRONCHOSCOPY;  Surgeon: [name], MD;  Location: [facility] OR;  Service: Thoracic;  Laterality: N/A;    Social History:  reports that he has never smoked. He has never used smokeless tobacco. He reports that he does not drink alcohol or use drugs.   Family History  Problem Relation Age of Onset  . Family history unknown: Yes    Allergies  Allergen Reactions  . Bidil [Isosorb Dinitrate-Hydralazine] Other (See Comments)    Headaches   . Spironolactone Other (See Comments)    Hyperkalemia   . Hydralazine Hcl Other (See Comments)    "Fuzzy  headed" feeling     Medications: I have reviewed patients current medications as documented in Epic Anti-infectives    Start     Dose/Rate Route Frequency Ordered Stop   06/09/16 1700  cefTRIAXone (ROCEPHIN) 2 g in dextrose 5 % 50 mL IVPB     2 g 100 mL/hr over 30 Minutes Intravenous Every 24 hours 06/09/16 1039 06/22/16 2359   06/09/16 1200  azithromycin (ZITHROMAX) 500 mg in dextrose 5 % 250 mL IVPB     500 mg 250 mL/hr over 60 Minutes Intravenous Every 24 hours 06/09/16 1039 06/13/16 1346   06/07/16 1900  vancomycin (VANCOCIN) 1,250 mg in sodium chloride 0.9 % 250 mL IVPB  Status:  Discontinued     1,250 mg 166.7 mL/hr over 90 Minutes Intravenous Every 24 hours 06/06/16 1751 06/08/16 1208   06/07/16 0200  piperacillin-tazobactam (ZOSYN) IVPB 3.375 g  Status:  Discontinued     3.375 g 12.5 mL/hr over 240 Minutes Intravenous Every 8 hours 06/06/16 1749 06/09/16 1038   06/06/16 1830   piperacillin-tazobactam (ZOSYN) IVPB 3.375 g  Status:  Discontinued     3.375 g 100 mL/hr over 30 Minutes Intravenous  Once 06/06/16 1817 06/06/16 1826   06/06/16 1830  vancomycin (VANCOCIN) IVPB 1000 mg/200 mL premix  Status:  Discontinued     1,000 mg 200 mL/hr over 60 Minutes Intravenous  Once 06/06/16 1817 06/06/16 1826   06/06/16 1800  vancomycin (VANCOCIN) 2,000 mg in sodium chloride 0.9 % 500 mL IVPB     2,000 mg 250 mL/hr over 120 Minutes Intravenous  Once 06/06/16 1748 06/06/16 2023   06/06/16 1745  piperacillin-tazobactam (ZOSYN) IVPB 3.375 g     3.375 g 100 mL/hr over 30 Minutes Intravenous  Once 06/06/16 1744 06/06/16 1823   06/06/16 1745  vancomycin (VANCOCIN) IVPB 1000 mg/200 mL premix  Status:  Discontinued     1,000 mg 200 mL/hr over 60 Minutes Intravenous  Once 06/06/16 1744 06/06/16 1748         ROS:  as in HPI otherwise remainder of 12 point Review of Systems is complicated by pt being on ventilator  Blood pressure 128/89, pulse (!) 125, temperature 98 F (36.7 C), temperature source Oral, resp. rate (!) 23, height _0  (1.676 m), weight 213 lb 13.5 oz (97 kg), SpO2 97 %. General: Alert and awake, oriented x3, on the ventilator HEENT: anicteric sclera,  EOMI, intubated Cardiovascular: Tachycardic, normal r,  no murmur rubs or gallops, pacemaker pocket is clean Pulmonary: Diminished breath sounds at the bases with some rhonchi.  Gastrointestinal: soft nontender, nondistended, normal bowel sounds, Musculoskeletal: no  clubbing or edema noted bilaterally Skin, soft tissue:he has Hyperpigmented areas on his feet which seem to be likely chronic there is not any obvious Osler's nodes or Janeway's nor other any splinter hemorrhages. Neuro: nonfocal, strength  intact   Results for orders placed or performed during the hospital encounter of 06/06/16 (from the past 48 hour(s))  Glucose, capillary     Status: Abnormal   Collection Time: 06/12/16  4:34 PM  Result Value  Ref Range   Glucose-Capillary 131 (H) 65 - 99 mg/dL   Comment 1 Notify RN    Comment 2 Document in Chart   Glucose, capillary     Status: Abnormal   Collection Time: 06/12/16  7:40 PM  Result Value Ref Range   Glucose-Capillary 158 (H) 65 - 99 mg/dL  Glucose, capillary     Status: Abnormal   Collection Time: 06/13/16  12:08 AM  Result Value Ref Range   Glucose-Capillary 147 (H) 65 - 99 mg/dL   Comment 1 Notify RN    Comment 2 Document in Chart   Glucose, capillary     Status: Abnormal   Collection Time: 06/13/16  3:59 AM  Result Value Ref Range   Glucose-Capillary 141 (H) 65 - 99 mg/dL   Comment 1 Notify RN    Comment 2 Document in Chart   Renal function panel     Status: Abnormal   Collection Time: 06/13/16  5:04 AM  Result Value Ref Range   Sodium 145 135 - 145 mmol/L   Potassium 4.0 3.5 - 5.1 mmol/L   Chloride 100 (L) 101 - 111 mmol/L   CO2 28 22 - 32 mmol/L   Glucose, Bld 142 (H) 65 - 99 mg/dL   BUN 112 (H) 6 - 20 mg/dL   Creatinine, Ser 3.64 (H) 0.61 - 1.24 mg/dL   Calcium 9.6 8.9 - 10.3 mg/dL   Phosphorus 4.8 (H) 2.5 - 4.6 mg/dL   Albumin 3.0 (L) 3.5 - 5.0 g/dL   GFR calc non Af Amer 17 (L) >60 mL/min   GFR calc Af Amer 20 (L) >60 mL/min    Comment: (NOTE) The eGFR has been calculated using the CKD EPI equation. This calculation has not been validated in all clinical situations. eGFR's persistently <60 mL/min signify possible Chronic Kidney Disease.    Anion gap 17 (H) 5 - 15  CBC     Status: Abnormal   Collection Time: 06/13/16  5:04 AM  Result Value Ref Range   WBC 17.6 (H) 4.0 - 10.5 K/uL   RBC 4.91 4.22 - 5.81 MIL/uL   Hemoglobin 14.5 13.0 - 17.0 g/dL   HCT 43.9 39.0 - 52.0 %   MCV 89.4 78.0 - 100.0 fL   MCH 29.5 26.0 - 34.0 pg   MCHC 33.0 30.0 - 36.0 g/dL   RDW 14.6 11.5 - 15.5 %   Platelets 205 150 - 400 K/uL  Magnesium     Status: None   Collection Time: 06/13/16  5:04 AM  Result Value Ref Range   Magnesium 1.9 1.7 - 2.4 mg/dL  Phosphorus      Status: Abnormal   Collection Time: 06/13/16  5:04 AM  Result Value Ref Range   Phosphorus 4.7 (H) 2.5 - 4.6 mg/dL  .Cooxemetry Panel (carboxy, met, total hgb, O2 sat)     Status: Abnormal   Collection Time: 06/13/16  5:10 AM  Result Value Ref Range   Total hemoglobin 16.1 (H) 12.0 - 16.0 g/dL   O2 Saturation 64.1 %   Carboxyhemoglobin 1.1 0.5 - 1.5 %   Methemoglobin 0.8 0.0 - 1.5 %  Glucose, capillary     Status: Abnormal   Collection Time: 06/13/16  8:23 AM  Result Value Ref Range   Glucose-Capillary 139 (H) 65 - 99 mg/dL   Comment 1 Notify RN    Comment 2 Document in Chart   Procalcitonin - Baseline     Status: None   Collection Time: 06/13/16  9:55 AM  Result Value Ref Range   Procalcitonin 2.73 ng/mL    Comment:        Interpretation: PCT > 2 ng/mL: Systemic infection (sepsis) is likely, unless other causes are known. (NOTE)         ICU PCT Algorithm               Non ICU PCT Algorithm    ----------------------------     ------------------------------  PCT < 0.25 ng/mL                 PCT < 0.1 ng/mL     Stopping of antibiotics            Stopping of antibiotics       strongly encouraged.               strongly encouraged.    ----------------------------     ------------------------------       PCT level decrease by               PCT < 0.25 ng/mL       >= 80% from peak PCT       OR PCT 0.25 - 0.5 ng/mL          Stopping of antibiotics                                             encouraged.     Stopping of antibiotics           encouraged.    ----------------------------     ------------------------------       PCT level decrease by              PCT >= 0.25 ng/mL       < 80% from peak PCT        AND PCT >= 0.5 ng/mL            Continuing antibiotics                                               encouraged.       Continuing antibiotics            encouraged.    ----------------------------     ------------------------------     PCT level increase compared           PCT > 0.5 ng/mL         with peak PCT AND          PCT >= 0.5 ng/mL             Escalation of antibiotics                                          strongly encouraged.      Escalation of antibiotics        strongly encouraged.   Glucose, capillary     Status: Abnormal   Collection Time: 06/13/16 11:35 AM  Result Value Ref Range   Glucose-Capillary 132 (H) 65 - 99 mg/dL   Comment 1 Notify RN    Comment 2 Document in Chart   Glucose, capillary     Status: Abnormal   Collection Time: 06/13/16  3:34 PM  Result Value Ref Range   Glucose-Capillary 144 (H) 65 - 99 mg/dL   Comment 1 Venous Specimen   Glucose, capillary     Status: Abnormal   Collection Time: 06/13/16  7:41 PM  Result Value Ref Range   Glucose-Capillary 129 (H) 65 - 99 mg/dL   Comment 1 Notify RN    Comment 2  Document in Chart   Culture, respiratory (NON-Expectorated)     Status: None (Preliminary result)   Collection Time: 06/13/16  8:35 PM  Result Value Ref Range   Specimen Description TRACHEAL ASPIRATE    Special Requests NONE    Gram Stain      FEW WBC PRESENT,BOTH PMN AND MONONUCLEAR NO ORGANISMS SEEN    Culture PENDING    Report Status PENDING   Glucose, capillary     Status: Abnormal   Collection Time: 06/13/16 11:18 PM  Result Value Ref Range   Glucose-Capillary 149 (H) 65 - 99 mg/dL   Comment 1 Notify RN    Comment 2 Document in Chart   Glucose, capillary     Status: Abnormal   Collection Time: 06/14/16  3:41 AM  Result Value Ref Range   Glucose-Capillary 170 (H) 65 - 99 mg/dL   Comment 1 Notify RN   Renal function panel     Status: Abnormal   Collection Time: 06/14/16  3:47 AM  Result Value Ref Range   Sodium 146 (H) 135 - 145 mmol/L   Potassium 3.8 3.5 - 5.1 mmol/L   Chloride 99 (L) 101 - 111 mmol/L   CO2 29 22 - 32 mmol/L   Glucose, Bld 154 (H) 65 - 99 mg/dL   BUN 132 (H) 6 - 20 mg/dL   Creatinine, Ser 3.78 (H) 0.61 - 1.24 mg/dL   Calcium 9.9 8.9 - 10.3 mg/dL   Phosphorus 6.0 (H) 2.5 -  4.6 mg/dL   Albumin 2.9 (L) 3.5 - 5.0 g/dL   GFR calc non Af Amer 16 (L) >60 mL/min   GFR calc Af Amer 19 (L) >60 mL/min    Comment: (NOTE) The eGFR has been calculated using the CKD EPI equation. This calculation has not been validated in all clinical situations. eGFR's persistently <60 mL/min signify possible Chronic Kidney Disease.    Anion gap 18 (H) 5 - 15  CBC     Status: Abnormal   Collection Time: 06/14/16  3:47 AM  Result Value Ref Range   WBC 18.5 (H) 4.0 - 10.5 K/uL   RBC 4.90 4.22 - 5.81 MIL/uL   Hemoglobin 14.4 13.0 - 17.0 g/dL   HCT 43.7 39.0 - 52.0 %   MCV 89.2 78.0 - 100.0 fL   MCH 29.4 26.0 - 34.0 pg   MCHC 33.0 30.0 - 36.0 g/dL   RDW 14.6 11.5 - 15.5 %   Platelets 230 150 - 400 K/uL  Magnesium     Status: None   Collection Time: 06/14/16  3:47 AM  Result Value Ref Range   Magnesium 2.1 1.7 - 2.4 mg/dL  Procalcitonin     Status: None   Collection Time: 06/14/16  3:47 AM  Result Value Ref Range   Procalcitonin 3.12 ng/mL    Comment:        Interpretation: PCT > 2 ng/mL: Systemic infection (sepsis) is likely, unless other causes are known. (NOTE)         ICU PCT Algorithm               Non ICU PCT Algorithm    ----------------------------     ------------------------------         PCT < 0.25 ng/mL                 PCT < 0.1 ng/mL     Stopping of antibiotics            Stopping of antibiotics  strongly encouraged.               strongly encouraged.    ----------------------------     ------------------------------       PCT level decrease by               PCT < 0.25 ng/mL       >= 80% from peak PCT       OR PCT 0.25 - 0.5 ng/mL          Stopping of antibiotics                                             encouraged.     Stopping of antibiotics           encouraged.    ----------------------------     ------------------------------       PCT level decrease by              PCT >= 0.25 ng/mL       < 80% from peak PCT        AND PCT >= 0.5 ng/mL             Continuing antibiotics                                               encouraged.       Continuing antibiotics            encouraged.    ----------------------------     ------------------------------     PCT level increase compared          PCT > 0.5 ng/mL         with peak PCT AND          PCT >= 0.5 ng/mL             Escalation of antibiotics                                          strongly encouraged.      Escalation of antibiotics        strongly encouraged.   .Cooxemetry Panel (carboxy, met, total hgb, O2 sat)     Status: Abnormal   Collection Time: 06/14/16  4:00 AM  Result Value Ref Range   Total hemoglobin 15.0 12.0 - 16.0 g/dL   O2 Saturation 64.6 %   Carboxyhemoglobin 1.8 (H) 0.5 - 1.5 %   Methemoglobin 0.6 0.0 - 1.5 %  Glucose, capillary     Status: Abnormal   Collection Time: 06/14/16  8:07 AM  Result Value Ref Range   Glucose-Capillary 156 (H) 65 - 99 mg/dL  Glucose, capillary     Status: Abnormal   Collection Time: 06/14/16 12:04 PM  Result Value Ref Range   Glucose-Capillary 183 (H) 65 - 99 mg/dL   Comment 1 Capillary Specimen    _0 (sdes,specrequest,cult,reptstatus)   ) Recent Results (from the past 720 hour(s))  Culture, blood (x 2)     Status: Abnormal (Preliminary result)   Collection Time: 06/06/16  5:50 PM  Result Value Ref Range Status   Specimen Description BLOOD RIGHT ANTECUBITAL  Final   Special Requests IN PEDIATRIC BOTTLE Blood Culture adequate volume  Final   Culture  Setup Time   Final    GRAM POSITIVE COCCI IN CHAINS IN PAIRS IN PEDIATRIC BOTTLE CRITICAL RESULT CALLED TO, READ BACK BY AND VERIFIED WITH: CARON AMEND,PHARMD _0  06/08/16 MKELLY,MLT    Culture (A)  Final    VIRIDANS STREPTOCOCCUS CULTURE REINCUBATED FOR BETTER GROWTH    Report Status PENDING  Incomplete  Blood Culture (routine x 2)     Status: None   Collection Time: 06/06/16  6:21 PM  Result Value Ref Range Status   Specimen Description BLOOD RIGHT HAND   Final   Special Requests   Final    BOTTLES DRAWN AEROBIC ONLY Blood Culture adequate volume   Culture NO GROWTH 5 DAYS  Final   Report Status 06/11/2016 FINAL  Final  MRSA PCR Screening     Status: None   Collection Time: 06/06/16  8:44 PM  Result Value Ref Range Status   MRSA by PCR NEGATIVE NEGATIVE Final    Comment:        The GeneXpert MRSA Assay (FDA approved for NASAL specimens only), is one component of a comprehensive MRSA colonization surveillance program. It is not intended to diagnose MRSA infection nor to guide or monitor treatment for MRSA infections.   Culture, blood (routine x 2)     Status: None   Collection Time: 06/09/16 10:25 AM  Result Value Ref Range Status   Specimen Description BLOOD RIGHT HAND  Final   Special Requests IN PEDIATRIC BOTTLE Blood Culture adequate volume  Final   Culture NO GROWTH 5 DAYS  Final   Report Status 06/14/2016 FINAL  Final  Culture, blood (routine x 2)     Status: None   Collection Time: 06/09/16 10:31 AM  Result Value Ref Range Status   Specimen Description BLOOD RIGHT THUMB  Final   Special Requests IN PEDIATRIC BOTTLE Blood Culture adequate volume  Final   Culture NO GROWTH 5 DAYS  Final   Report Status 06/14/2016 FINAL  Final  Culture, respiratory (NON-Expectorated)     Status: None (Preliminary result)   Collection Time: 06/13/16  8:35 PM  Result Value Ref Range Status   Specimen Description TRACHEAL ASPIRATE  Final   Special Requests NONE  Final   Gram Stain   Final    FEW WBC PRESENT,BOTH PMN AND MONONUCLEAR NO ORGANISMS SEEN    Culture PENDING  Incomplete   Report Status PENDING  Incomplete     Impression/Recommendation  Active Problems:   Acute respiratory failure with hypoxia (HCC)   Pressure injury of skin   Acute respiratory failure with hypoxemia (HCC)   GLADYS GUTMAN is a 56 y.o. 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  --He needs a transesophageal echocardiogram to evaluate his heart valves and his device --Once he is extubated and we can interview him more thoroughly to look for evidence of infection elsewhere. --once we have sorted out what his ICD looks like and his valves we will want to try to give him a catheter holiday when He is stable to have this done.  #2 screening he needs to be screened for HIV and hep C  06/14/2016, 12:54 PM   Thank you  so much for this interesting consult  [place] [facility] [phone] (pager) [phone] (office) 06/14/2016, 12:54 PM  Rhina Brack[place] 06/14/2016, 12:54 PM

## 2016-06-14 NOTE — Progress Notes (Signed)
Advanced Heart Failure Rounding Note   Subjective:    Awake on vent, HR sinus in the 110's. Co ox 64.6%, not on inotropes.   Tmax overnight 99.8, on Rocephin. Azithromycin completed 4/9  Creatinine 3.49->3.29->3.64->3.78. BUN remains high. Weight stable, out 840 overnight.  Off sedation.    Objective:   Weight Range:  Vital Signs:   Temp:  [98 F (36.7 C)-99.8 F (37.7 C)] 98 F (36.7 C) (04/10 0400) Pulse Rate:  [51-122] 110 (04/10 0700) Resp:  [19-33] 19 (04/10 0700) BP: (93-129)/(68-104) 125/87 (04/10 0700) SpO2:  [90 %-100 %] 98 % (04/10 0700) FiO2 (%):  [30 %-40 %] 30 % (04/10 0338) Weight:  [213 lb 13.5 oz (97 kg)] 213 lb 13.5 oz (97 kg) (04/10 0417) Last BM Date: 06/13/16  Weight change: Filed Weights   06/12/16 0413 06/13/16 0500 06/14/16 0417  Weight: 217 lb 6 oz (98.6 kg) 214 lb 15.2 oz (97.5 kg) 213 lb 13.5 oz (97 kg)    Intake/Output:   Intake/Output Summary (Last 24 hours) at 06/14/16 0750 Last data filed at 06/14/16 0700  Gross per 24 hour  Intake             2085 ml  Output             2000 ml  Net               85 ml     Physical Exam: General: Intubated male, using white board to communicate HEENT: R eye opaque, ETT present. Neck: supple. LIJ central line, JVP   Cor: PMI laterally displaced, regular rhythm, tachycardic.  Lungs: Rhonchi in upper lobes anteriorly.  Abdomen: obese soft, nontender, nondistended.  Positive bowel sounds. Extremities: no cyanosis, clubbing, rash. Trace pretibial edema.  Neuro: alert & orientedx3, cranial nerves grossly intact. moves all 4 extremities w/o difficulty. Affect pleasant  Telemetry: Sinus Tach rates in the 100's.   Labs: Basic Metabolic Panel:  Recent Labs Lab 06/10/16 0638 06/11/16 0435 06/12/16 0350 06/13/16 0504 06/14/16 0347  NA 144 145 145 145 146*  K 3.3* 3.8 3.9 4.0 3.8  CL 103 103 101 100* 99*  CO2 '26 28 29 28 29  '$ GLUCOSE 135* 116* 117* 142* 154*  BUN 70* 79* 90* 112* 132*    CREATININE 3.37* 3.49* 3.29* 3.64* 3.78*  CALCIUM 8.7* 9.1 9.5 9.6 9.9  MG 2.2 2.2 2.0 1.9 2.1  PHOS 2.6 3.1 4.1  4.2 4.7*  4.8* 6.0*    Liver Function Tests:  Recent Labs Lab 06/11/16 0435 06/12/16 0350 06/13/16 0504 06/14/16 0347  ALBUMIN 2.6* 2.7* 3.0* 2.9*    CBC:  Recent Labs Lab 06/09/16 1025 06/10/16 0638 06/11/16 0500 06/12/16 0350 06/13/16 0504 06/14/16 0347  WBC  --  13.1* 13.7* 15.7* 17.6* 18.5*  NEUTROABS 10.2*  --   --   --   --   --   HGB  --  13.9 13.5 14.1 14.5 14.4  HCT  --  41.3 41.9 42.5 43.9 43.7  MCV  --  87.5 89.1 89.3 89.4 89.2  PLT  --  154 179 198 205 230    Cardiac Enzymes:  Recent Labs Lab 06/07/16 1416  TROPONINI 0.17*    BNP: BNP (last 3 results)  Recent Labs  06/06/16 1645 06/06/16 1925  BNP 796.0* 553.0*      Transthoracic Echocardiography 06/10/16 Study Conclusions  - Left ventricle: The cavity size was severely dilated. Wall   thickness was normal. The estimated ejection  fraction was 20%.   Diffuse hypokinesis. - Aortic valve: There was mild regurgitation. - Mitral valve: There was moderate regurgitation. - Left atrium: The atrium was moderately dilated. - Atrial septum: No defect or patent foramen ovale was identified.    Imaging: Dg Chest Port 1 View  Result Date: 06/13/2016 CLINICAL DATA:  Worse part or if failure.  Shortness of breath. EXAM: PORTABLE CHEST 1 VIEW COMPARISON:  One-view chest x-ray 06/10/2016 FINDINGS: The heart is enlarged. Patient remains intubated. The endotracheal tube is stable. NG tube courses off the inferior border the film. A right IJ line is in place. Interstitial edema and mild patchy airspace disease is stable. IMPRESSION: 1. Mild edema and patchy airspace disease is stable. 2. Support apparatus is stable. Electronically Signed   By: San Morelle M.D.   On: 06/13/2016 07:14     Medications:     Scheduled Medications: . cefTRIAXone (ROCEPHIN)  IV  2 g Intravenous  Q24H  . chlorhexidine gluconate (MEDLINE KIT)  15 mL Mouth Rinse BID  . Chlorhexidine Gluconate Cloth  6 each Topical Daily  . feeding supplement (VITAL HIGH PROTEIN)  1,000 mL Per Tube Q24H  . fentaNYL (SUBLIMAZE) injection  50 mcg Intravenous Once  . furosemide  80 mg Intravenous Q8H  . heparin  5,000 Units Subcutaneous Q8H  . insulin aspart  2-6 Units Subcutaneous Q4H  . mouth rinse  15 mL Mouth Rinse QID  . pantoprazole sodium  40 mg Per Tube Daily  . pneumococcal 23 valent vaccine  0.5 mL Intramuscular Tomorrow-1000  . sodium chloride flush  10-40 mL Intracatheter Q12H  . tobramycin-dexamethasone   Right Eye BID    Infusions: . sodium chloride 10 mL/hr at 06/10/16 1203  . sodium chloride 10 mL/hr at 06/10/16 1203  . fentaNYL infusion INTRAVENOUS Stopped (06/13/16 1745)    PRN Medications: fentaNYL, midazolam, sodium chloride flush   Assessment:   1. Septic shock: treated for CAP.  2. Acute hypoxic respiratory failure 3. Acute on chronic renal failure 4. Chronic systolic HF EF 56% on echo 06/09/16 5. Sinus tachycardia 6. Paralyzed hemidiaphragm  7. h/o paraganglioma resection  Plan/Discussion:    Remains on abx for bronchitis/PNA. Co ox remains stable at 64%. Has been > 60% the last 3 days. CVP is 10.   Creatinine continues to rise, 3.29->3.64->3.78. Nephrology following, continuing IV diuresis.  Volume status stable. BP improving (now off all sedation).   Length of Stay: Laird NP 06/14/2016, 7:50 AM Advanced Heart Failure Team  Pager [phone] M-F 7am-4pm.  Please contact [facility] for night-coverage after hours (4p -7a ) and weekends on [email or web address]  Patient seen with NP, agree with the above note.  BUN/creatinine up again, CVP around 10.  Would hold Lasix.  Good co-ox suggests adequate cardiac output.  Think PNA is the main issue as mentioned in yesterday's note.    Loralie Champagne 06/14/2016

## 2016-06-14 NOTE — Progress Notes (Signed)
Patient Name: [name]      SUBJECTIVE without complaints of chest pain. He is  I did not get confirmation of the notes from yesterday suggesting that he was ready to die. In in fact, specifically denied that.  I have spoken with Dr. Drucilla Schmidt. There apparently has been confusion related to the 2 blood cultures; it turned out the second one was drawn after antibiotics were initiated.    Past Medical History:  Diagnosis Date  . Arthritis    GOUT  . Automatic implantable cardioverter-defibrillator in situ   . Biventricular ICD (implantable cardiac defibrillator) [employer] ]    DOI 2008/ upgrade 2010/ Gen Change 2014  . Bladder tumor    PT HOSP AT [facility] 4/24 TO 4/26 2014 WITH UTI--AND FOUND TO HAVE BLADDER TUMOR-  . Cardiomyopathy    Idiopathic dilated;   . CHF (congestive heart failure) ([place])   . CKD (chronic kidney disease)    stage III baseline Crt 1.8-2.0  . Coronary artery disease   . Gout 08/03/12   PT C/O OF RIGHT KNEE PAIN AND SWELLING -STATES GOUT FLARE UP - ONGOING SINCE APRIL - BUT SWELLING W/IN LAST WEEK  . Hilar mass    Noted CT 2010  . HTN (hypertension)    Dr. Caryl Comes cardiologist  . Hypothyroidism   . 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 Sleep apnea    CPAP  . Systolic CHF ([place])    EF 19%  . Ventricular tachycardia (HCC)    s/p ICD    Scheduled Meds:  Scheduled Meds: . cefTRIAXone (ROCEPHIN)  IV  2 g Intravenous Q24H  . chlorhexidine gluconate (MEDLINE KIT)  15 mL Mouth Rinse BID  . Chlorhexidine Gluconate Cloth  6 each Topical Daily  . feeding supplement (VITAL HIGH PROTEIN)  1,000 mL Per Tube Q24H  . fentaNYL (SUBLIMAZE) injection  50 mcg Intravenous Once  . heparin  5,000 Units Subcutaneous Q8H  . insulin aspart  2-6 Units Subcutaneous Q4H  . mouth rinse  15 mL Mouth Rinse QID  . pantoprazole sodium  40 mg Per Tube Daily  . pneumococcal 23 valent vaccine  0.5 mL Intramuscular  Tomorrow-1000  . sodium chloride flush  10-40 mL Intracatheter Q12H  . tobramycin-dexamethasone   Right Eye BID   Continuous Infusions: . sodium chloride 10 mL/hr at 06/14/16 0800  . sodium chloride 10 mL/hr at 06/10/16 1203  . fentaNYL infusion INTRAVENOUS Stopped (06/13/16 1745)   fentaNYL, midazolam, sodium chloride flush    PHYSICAL EXAM Vitals:   06/14/16 1000 06/14/16 1009 06/14/16 1100 06/14/16 1206  BP: (!) 122/92  128/89   Pulse:  (!) 111 (!) 125   Resp: (!) 33  (!) 23   Temp:    98 F (36.7 C)  TempSrc:    Oral  SpO2:   97%   Weight:      Height:       Well developed and nourished iintubated ENT normal Neck supple with JVP-flat Clear Rapid but regular rate and rhythm, 2/6  murmur Abd-soft with active BS No Clubbing cyanosis edema Skin-warm and dry A & Oriented  Grossly normal sensory and motor function  TELEMETRY: Reviewed personnally pt in sinus tach     Intake/Output Summary (Last 24 hours) at 06/14/16 1255 Last data filed at 06/14/16 0800  Gross per 24 hour  Intake  1470 ml  Output             1700 ml  Net             -230 ml    LABS: Basic Metabolic Panel:  Recent Labs Lab 06/08/16 0431  06/09/16 0534  06/10/16 1224 06/11/16 0435 06/12/16 0350 06/13/16 0504 06/14/16 0347  NA 141  --  142  --  144 145 145 145 146*  K 4.1  --  3.9  --  3.3* 3.8 3.9 4.0 3.8  CL 100*  --  101  --  103 103 101 100* 99*  CO2 27  --  27  --  _0 GLUCOSE 89  --  123*  --  135* 116* 117* 142* 154*  BUN 50*  --  58*  --  70* 79* 90* 112* 132*  CREATININE 3.66*  --  3.74*  --  3.37* 3.49* 3.29* 3.64* 3.78*  CALCIUM 9.1  --  9.0  --  8.7* 9.1 9.5 9.6 9.9  MG 2.1  < > 2.3  < > 2.2 2.2 2.0 1.9 2.1  PHOS 4.1  < > 3.7  < > 2.6 3.1 4.1  4.2 4.7*  4.8* 6.0*  < > = values in this interval not displayed. Cardiac Enzymes: No results for input(s): CKTOTAL, CKMB, CKMBINDEX, TROPONINI in the last 72 hours. CBC:  Recent Labs Lab 06/08/16 0431  06/09/16 0534 06/09/16 1025 06/10/16 8250 06/11/16 0500 06/12/16 0350 06/13/16 0504 06/14/16 0347  WBC 11.2* 13.9*  --  13.1* 13.7* 15.7* 17.6* 18.5*  NEUTROABS  --   --  10.2*  --   --   --   --   --   HGB 14.8 14.6  --  13.9 13.5 14.1 14.5 14.4  HCT 44.3 44.8  --  41.3 41.9 42.5 43.9 43.7  MCV 87.4 88.5  --  87.5 89.1 89.3 89.4 89.2  PLT 172 153  --  154 179 198 205 230   PROTIME: No results for input(s): LABPROT, INR in the last 72 hours. Liver Function Tests:  Recent Labs  06/13/16 0504 06/14/16 0347  ALBUMIN 3.0* 2.9*   No results for input(s): LIPASE, AMYLASE in the last 72 hours. BNP: BNP (last 3 results)  Recent Labs  06/06/16 1645 06/06/16 1925  BNP 796.0* 553.0*    ProBNP (last 3 results) No results for input(s): PROBNP in the last 8760 hours.     ASSESSMENT AND PLAN:  Active Problems:   SYSTOLIC HEART FAILURE, CHRONIC   Automatic implantable cardioverter-defibrillator in situ   Diaphragm paralysis   Acute respiratory failure with hypoxia (HCC)   Pressure injury of skin   Acute respiratory failure with hypoxemia (HCC)   Bacteremia due to Streptococcus  The patient continues with respiratory failure with difficulty in extubation. I discussed with Dr. Titus Mould this morning the possibility of trying to put him in reversed Trendelenburg to help remove his abdominal contents from his diaphragm to help with extubation.  I spoke with Dr. Drucilla Schmidt. Now have concern as to whether the bacteremia is in fact really not a contaminant. It would be appropriate to consider TEE and this prior to extubation. The information would inform durations of therapy although I do not think he is a candidate at this point for lead extraction  The patient denied a death wish this morning as outlined in previous notes.  Signed, Virl Axe MD  06/14/2016

## 2016-06-14 NOTE — Progress Notes (Signed)
Rehab Admissions Coordinator Note:  Patient was screened by Retta Diones for appropriateness for an Inpatient Acute Rehab Consult.  At this time, patient is on the vent.  Once off the vent, then can determine if patient might be appropriate for an inpatient rehab consult.  Retta Diones 06/14/2016, 12:43 PM  I can be reached at [phone].

## 2016-06-14 NOTE — Progress Notes (Addendum)
PULMONARY  / CRITICAL CARE MEDICINE  Name: [name] MRN: [id number] DOB: 10-21-1960    LOS: 56  REFERRING MD :  Dr. Winfred Leeds  CHIEF COMPLAINT:  SOB  BRIEF PATIENT DESCRIPTION: 79M w/ PMHx of CHF, hemidiaphragm s/p paraganglioma resection, CKD, CAD, ICD, and hypothyroidism who presents w/ SOB who was initially tx for CHF exacerbation in the ED and then worked up for sepsis after developing temp of 104.23F while in the ED.   Lines / Drains: ETT>> 4/2 OG>> 4/5  Cultures: Blood cultures x 2 >>4/2-- 1/2 strep viridans  Blood culture >> 4/5  Antibiotics: Vanc >>4/2-4/3 Zosyn >>4/24-4/5 Azithro >>4/5 Rocephin >>4/5  Tests / Events: CXR 4/2 >> mild vascular congestion, stable cardiomegaly CXR 4/5 >> pulmonary vascular congestion  Renal U/s 4/6>> neg for hydronephrosis, 1.7cm left kidney cyst ECHO 4/5>> EF 20% CXR 4/9 >> Mild edema and patchy airspace disease is stable.   Subjective:  Tmax 100.1F 2 days ago. Failed wean yesterday. Alert on vent. NAD  VITAL SIGNS: Temp:  [98 F (36.7 C)-99.8 F (37.7 C)] 98 F (36.7 C) (04/10 0400) Pulse Rate:  [51-122] 110 (04/10 0700) Resp:  [19-33] 19 (04/10 0700) BP: (93-129)/(68-104) 125/87 (04/10 0700) SpO2:  [90 %-100 %] 98 % (04/10 0700) FiO2 (%):  [30 %-40 %] 30 % (04/10 0338) Weight:  [213 lb 13.5 oz (97 kg)] 213 lb 13.5 oz (97 kg) (04/10 0417)   HEMODYNAMICS: CVP:  [10 mmHg] 10 mmHg VENTILATOR SETTINGS: Vent Mode: PRVC FiO2 (%):  [30 %-40 %] 30 % Set Rate:  [22 bmp] 22 bmp Vt Set:  [510 mL] 510 mL PEEP:  [5 cmH20] 5 cmH20 Pressure Support:  [10 cmH20-14 cmH20] 10 cmH20 Plateau Pressure:  [20 cmH20-22 cmH20] 20 cmH20  INTAKE / OUTPUT: Intake/Output      04/09 0701 - 04/10 0700 04/10 0701 - 04/11 0700   I.V. (mL/kg) 255 (2.6)    NG/GT 1530    IV Piggyback 300    Total Intake(mL/kg) 2085 (21.5)    Urine (mL/kg/hr) 1950 (0.8)    Stool 50 (0)    Total Output 2000     Net +85          Stool Occurrence 1 x      PHYSICAL EXAMINATION: General:  NAD, well nourished Neuro:  RASS -1, moving all extremities  HEENT:  [place]/AT, PERRL, EOM-I and MMM, ETT in place Cardiovascular:  RRR, no murmurs Lungs:  Clear on anterior auscultation b/l, no wheezing Abdomen:  Soft, non tender, non distended Skin:  Warm and dry, ICD pocket non tender, no fluctuance Ext: trace pedal edema  LABS: Cbc  Recent Labs Lab 06/12/16 0350 06/13/16 0504 06/14/16 0347  WBC 15.7* 17.6* 18.5*  HGB 14.1 14.5 14.4  HCT 42.5 43.9 43.7  PLT 198 205 230   Chemistry  Recent Labs Lab 06/12/16 0350 06/13/16 0504 06/14/16 0347  NA 145 145 146*  K 3.9 4.0 3.8  CL 101 100* 99*  CO2 29 28 29   BUN 90* 112* 132*  CREATININE 3.29* 3.64* 3.78*  CALCIUM 9.5 9.6 9.9  MG 2.0 1.9 2.1  PHOS 4.1  4.2 4.7*  4.8* 6.0*  GLUCOSE 117* 142* 154*   Liver fxn  Recent Labs Lab 06/12/16 0350 06/13/16 0504 06/14/16 0347  ALBUMIN 2.7* 3.0* 2.9*   coags No results for input(s): APTT, INR in the last 168 hours. Sepsis markers  Recent Labs Lab 06/08/16 0431 06/13/16 0955 06/14/16 0347  PROCALCITON 12.28 2.73 3.12  Cardiac markers  Recent Labs Lab 06/07/16 1416  TROPONINI 0.17*   BNP No results for input(s): PROBNP in the last 168 hours. ABG  Recent Labs Lab 06/08/16 0350  PHART 7.515*  PCO2ART 33.6  PO2ART 129*  HCO3 26.9   CBG trend  Recent Labs Lab 06/13/16 1135 06/13/16 1534 06/13/16 1941 06/13/16 2318 06/14/16 0341  GLUCAP 132* 144* 129* 149* 170*   IMAGING:  ECG:  DIAGNOSES: Active Problems:   Acute respiratory failure with hypoxia (HCC)   Pressure injury of skin   Acute respiratory failure with hypoxemia (HCC)  ASSESSMENT / PLAN:  PULMONARY A: Acute hypoxemic resp failure 2/2 pulmonary edema/CHF + Deconditioning + Poor Lung reserve History of a paralyzed hemidiaphragm History of Paraganglioma Cardiorenal syndrome P:   - cont vent support, start weaning - cont diuresis - HF  team following - discontinued PRN albuterol in case it is contributing to tachycardia.    CARDIOVASCULAR A:  Acute on Chronic CHF exacerbation, EF 10% ECHO 4/5 >> EF 20% Pulm edema Hypertension  P:  - cont diuresis - HF and EP following  RENAL A:   AKI Cardiorenal syndrome P:   - KVO IVF -cont diuresis    GASTROINTESTINAL A:   No active issues P:   - Continue TF   HEMATOLOGIC A:   No active issues P:  - CBC in AM - Transfuse per ICU protocol   INFECTIOUS A:   Febrile, unknown source CAP P:   - on rocephin, completed course of azithromycin 4/9 - follow blood cultures - PCT trended down since admission - respiratory culture 4/9>>   ENDOCRINE A:   Hyperglycemia   P:   - ISS - CBGs  NEUROLOGIC A:   Anxiety P:   RASS goal: 0 to -2 - off sedation   [name], MD Internal Medicine Resident, PGY [place] Pager: [phone]   STAFF NOTE: I, Merrie Roof, MD FACP have personally reviewed patient's available data, including medical history, events of note, physical examination and test results as part of my evaluation. I have discussed with resident/NP and other care providers such as pharmacist, RN and RRT. In addition, I personally evaluated patient and elicited key findings of: awake and alert, fc, lungs exp wheezing rt, coarse, pcxr with infiltrates bilateral and retrocardiac?, no new abg on vent, weaning is goal cpap 5 but requires PS 16, goal is PS reduction, goal neg balance for chf component as able but crt rise and cvp 5  may need to hold, strep ver is pathogen likely would consult ID for duration abx course, feeding to remain, I am impressed for PNA on pcxr in addition to the pos BC, ensure neg cultures remain, BP remains WNL not on inotropic support, upright as able to to reduce any contribution from hemi diaphragm, I updated tp in fullappears we should treat this as endocarditis, extend ctx fo  rnow The patient is critically ill with multiple organ systems failure and requires high complexity decision making for assessment and support, frequent evaluation and titration of therapies, application of advanced monitoring technologies and extensive interpretation of multiple databases.   Critical Care Time devoted to patient care services described in this note is30 Minutes. This time reflects time of care of this signee: Merrie Roof, MD FACP. This critical care time does not reflect procedure time, or teaching time or supervisory time of PA/NP/Med student/Med Resident etc but could involve care discussion time. Rest per NP/medical resident whose note is  outlined above and that I agree with   Lavon Paganini. Titus Mould, MD, [place] Pgr: [facility] 06/14/2016 8:59 AM

## 2016-06-14 NOTE — Progress Notes (Signed)
qPhysical Therapy Treatment Patient Details Name: [name] MRN: [id number] DOB: 01/23/1961 Today's Date: 06/14/2016    History of Present Illness 46M w/ PMHx of CHF, gout, cardiomyopathy, hemidiaphragm s/p paraganglioma resection, CKD, CAD, ICD, and hypothyroidism who presents w/ SOB who was initially tx for CHF exacerbation in the ED and then worked up for sepsis. Intubated 4/2    PT Comments    Pt alert and very willing to mobilize today. Pt writing and indicating gout in bil LE causes pain currently limiting mobility and ability to stand. Pt assist with rolling for pericare prior to transfer with nursing present. Pt educated for transfers, standing and progression. Pt appreciative of transfer OOB to chair today. Will continue to follow to maximize function.  Pt on CPAP with sats 98% and HR 111, ETT stable    Follow Up Recommendations  CIR;Supervision/Assistance - 24 hour;LTACH (pending respiratory status)     Equipment Recommendations  Rolling walker with 5" wheels;3in1 (PT)    Recommendations for Other Services       Precautions / Restrictions Precautions Precautions: Fall Precaution Comments: vent, ETT, panda, rectal pouch    Mobility  Bed Mobility Overal bed mobility: Needs Assistance Bed Mobility: Rolling;Sidelying to Sit Rolling: Min assist Sidelying to sit: Mod assist;+2 for physical assistance       General bed mobility comments: cues for sequence with assist to elevate trunk and fully clear legs from surface  Transfers Overall transfer level: Needs assistance   Transfers: Sit to/from [employer] Transfers Sit to Stand: Mod assist;+2 physical assistance   Squat pivot transfers: Max assist;+2 physical assistance     General transfer comment: pt able to stand from EOB x 3 attempts today with limited ability to stand fully upright maintaining flexed posture despite assist and cues. Pt reports bil LE pain due to gout. On 3rd stand performed  semi-squat pivot with max assist to move and rotate pelvis bed to chair toward pt left  Ambulation/Gait             General Gait Details: unable at this time   Stairs            Wheelchair Mobility    Modified Rankin (Stroke Patients Only)       Balance Overall balance assessment: Needs assistance   Sitting balance-Leahy Scale: Poor Sitting balance - Comments: min-minguard sitting balance EOB grossly 8 min total   Standing balance support: Bilateral upper extremity supported Standing balance-Leahy Scale: Poor                              Cognition Arousal/Alertness: Awake/alert Behavior During Therapy: WFL for tasks assessed/performed Overall Cognitive Status: Difficult to assess                                        Exercises      General Comments        Pertinent Vitals/Pain Pain Score: 8  Pain Location: bil knees and feet pt writing it is due to gout Pain Descriptors / Indicators: Sore Pain Intervention(s): Limited activity within patient's tolerance;Repositioned;Monitored during session    Home Living                      Prior Function            PT Goals (current goals can  now be found in the care plan section) Progress towards PT goals: Progressing toward goals    Frequency           PT Plan Discharge plan needs to be updated    Co-evaluation             End of Session Equipment Utilized During Treatment: Gait belt;Oxygen (vent) Activity Tolerance: Patient limited by pain Patient left: in chair;with call bell/phone within reach;with chair alarm set;with nursing/sitter in room Nurse Communication: Mobility status;Need for lift equipment PT Visit Diagnosis: Unsteadiness on feet (R26.81);Muscle weakness (generalized) (M62.81);Difficulty in walking, not elsewhere classified (R26.2)     Time: 7579-7282 PT Time Calculation (min) (ACUTE ONLY): 27 min  Charges:  $Therapeutic Activity:  23-37 mins                    G Codes:       Elwyn Reach, PT [phone]    [place] 06/14/2016, 10:19 AM

## 2016-06-14 NOTE — Progress Notes (Signed)
Assessment/Plan: 1 CKD 3/AKI diuresing  2 VDRF vol, paralyzed diaphragm 3 HTN 4 obesity 5 paraganglioma 6 CAD 7 CM with EF 10. P Stop furosemide; needs extubation  Subjective: Interval History: good UOP  Objective: Vital signs in last 24 hours: Temp:  [98 F (36.7 C)-99.8 F (37.7 C)] 98.6 F (37 C) (04/10 0810) Pulse Rate:  [51-122] 114 (04/10 0800) Resp:  [19-33] 22 (04/10 0800) BP: (93-129)/(68-104) 122/88 (04/10 0800) SpO2:  [95 %-100 %] 100 % (04/10 0800) FiO2 (%):  [30 %] 30 % (04/10 0338) Weight:  [97 kg (213 lb 13.5 oz)] 97 kg (213 lb 13.5 oz) (04/10 0417) Weight change: -0.5 kg (-1 lb 1.6 oz)  Intake/Output from previous day: 04/09 0701 - 04/10 0700 In: 2085 [I.V.:255; NG/GT:1530; IV Piggyback:300] Out: 2000 [Urine:1950; Stool:50] Intake/Output this shift: Total I/O In: 40 [I.V.:10; NG/GT:30] Out: 300 [Urine:300]  General appearance: alert and cooperative Resp: clear to auscultation bilaterally Chest wall: no tenderness Cardio: regular rate and rhythm, S1, S2 normal, no murmur, click, rub or gallop Extremities: extremities normal, atraumatic, no cyanosis or edema  Follows commands  Lab Results:  Recent Labs  06/13/16 0504 06/14/16 0347  WBC 17.6* 18.5*  HGB 14.5 14.4  HCT 43.9 43.7  PLT 205 230   BMET:  Recent Labs  06/13/16 0504 06/14/16 0347  NA 145 146*  K 4.0 3.8  CL 100* 99*  CO2 28 29  GLUCOSE 142* 154*  BUN 112* 132*  CREATININE 3.64* 3.78*  CALCIUM 9.6 9.9   No results for input(s): PTH in the last 72 hours. Iron Studies: No results for input(s): IRON, TIBC, TRANSFERRIN, FERRITIN in the last 72 hours. Studies/Results: Dg Chest Port 1 View  Result Date: 06/13/2016 CLINICAL DATA:  Worse part or if failure.  Shortness of breath. EXAM: PORTABLE CHEST 1 VIEW COMPARISON:  One-view chest x-ray 06/10/2016 FINDINGS: The heart is enlarged. Patient remains intubated. The endotracheal tube is stable. NG tube courses off the inferior border  the film. A right IJ line is in place. Interstitial edema and mild patchy airspace disease is stable. IMPRESSION: 1. Mild edema and patchy airspace disease is stable. 2. Support apparatus is stable. Electronically Signed   By: San Morelle M.D.   On: 06/13/2016 07:14    Scheduled: . cefTRIAXone (ROCEPHIN)  IV  2 g Intravenous Q24H  . chlorhexidine gluconate (MEDLINE KIT)  15 mL Mouth Rinse BID  . Chlorhexidine Gluconate Cloth  6 each Topical Daily  . feeding supplement (VITAL HIGH PROTEIN)  1,000 mL Per Tube Q24H  . fentaNYL (SUBLIMAZE) injection  50 mcg Intravenous Once  . heparin  5,000 Units Subcutaneous Q8H  . insulin aspart  2-6 Units Subcutaneous Q4H  . mouth rinse  15 mL Mouth Rinse QID  . pantoprazole sodium  40 mg Per Tube Daily  . pneumococcal 23 valent vaccine  0.5 mL Intramuscular Tomorrow-1000  . sodium chloride flush  10-40 mL Intracatheter Q12H  . tobramycin-dexamethasone   Right Eye BID    LOS: 8 days   Rhodesia Stanger C 06/14/2016,8:55 AM

## 2016-06-14 NOTE — Progress Notes (Signed)
This note also relates to the following rows which could not be included: SpO2 - Cannot attach notes to unvalidated device data  Pt placed back on full vent support due to increased RR

## 2016-06-15 ENCOUNTER — Inpatient Hospital Stay (HOSPITAL_COMMUNITY): Payer: 59

## 2016-06-15 DIAGNOSIS — I340 Nonrheumatic mitral (valve) insufficiency: Secondary | ICD-10-CM

## 2016-06-15 LAB — RENAL FUNCTION PANEL
ANION GAP: 19 — AB (ref 5–15)
Albumin: 2.5 g/dL — ABNORMAL LOW (ref 3.5–5.0)
BUN: 160 mg/dL — ABNORMAL HIGH (ref 6–20)
CO2: 29 mmol/L (ref 22–32)
Calcium: 9.9 mg/dL (ref 8.9–10.3)
Chloride: 102 mmol/L (ref 101–111)
Creatinine, Ser: 3.73 mg/dL — ABNORMAL HIGH (ref 0.61–1.24)
GFR calc Af Amer: 19 mL/min — ABNORMAL LOW (ref >60)
GFR calc non Af Amer: 17 mL/min — ABNORMAL LOW (ref >60)
GLUCOSE: 152 mg/dL — AB (ref 65–99)
POTASSIUM: 3.9 mmol/L (ref 3.5–5.1)
Phosphorus: 5.4 mg/dL — ABNORMAL HIGH (ref 2.5–4.6)
SODIUM: 150 mmol/L — AB (ref 135–145)

## 2016-06-15 LAB — PROCALCITONIN: Procalcitonin: 3.27 ng/mL

## 2016-06-15 LAB — GLUCOSE, CAPILLARY
GLUCOSE-CAPILLARY: 134 mg/dL — AB (ref 65–99)
GLUCOSE-CAPILLARY: 142 mg/dL — AB (ref 65–99)
Glucose-Capillary: 111 mg/dL — ABNORMAL HIGH (ref 65–99)
Glucose-Capillary: 171 mg/dL — ABNORMAL HIGH (ref 65–99)
Glucose-Capillary: 206 mg/dL — ABNORMAL HIGH (ref 65–99)
Glucose-Capillary: 95 mg/dL (ref 65–99)

## 2016-06-15 LAB — BASIC METABOLIC PANEL
Anion gap: 19 — ABNORMAL HIGH (ref 5–15)
Anion gap: 15 (ref 5–15)
BUN: 160 mg/dL — AB (ref 6–20)
BUN: 153 mg/dL — AB (ref 6–20)
CALCIUM: 9.5 mg/dL (ref 8.9–10.3)
CO2: 29 mmol/L (ref 22–32)
CO2: 28 mmol/L (ref 22–32)
CREATININE: 3.79 mg/dL — AB (ref 0.61–1.24)
CREATININE: 3.61 mg/dL — AB (ref 0.61–1.24)
Calcium: 10 mg/dL (ref 8.9–10.3)
Chloride: 101 mmol/L (ref 101–111)
Chloride: 100 mmol/L — ABNORMAL LOW (ref 101–111)
GFR calc Af Amer: 19 mL/min — ABNORMAL LOW (ref >60)
GFR calc Af Amer: 20 mL/min — ABNORMAL LOW (ref >60)
GFR, EST NON AFRICAN AMERICAN: 16 mL/min — AB (ref >60)
GFR, EST NON AFRICAN AMERICAN: 17 mL/min — AB (ref >60)
Glucose, Bld: 133 mg/dL — ABNORMAL HIGH (ref 65–99)
Glucose, Bld: 209 mg/dL — ABNORMAL HIGH (ref 65–99)
Potassium: 4.1 mmol/L (ref 3.5–5.1)
Potassium: 3.7 mmol/L (ref 3.5–5.1)
SODIUM: 149 mmol/L — AB (ref 135–145)
SODIUM: 143 mmol/L (ref 135–145)

## 2016-06-15 LAB — CBC
HEMATOCRIT: 41.9 % (ref 39.0–52.0)
HEMOGLOBIN: 13.8 g/dL (ref 13.0–17.0)
MCH: 29.4 pg (ref 26.0–34.0)
MCHC: 32.9 g/dL (ref 30.0–36.0)
MCV: 89.3 fL (ref 78.0–100.0)
Platelets: 235 10*3/uL (ref 150–400)
RBC: 4.69 MIL/uL (ref 4.22–5.81)
RDW: 14.8 % (ref 11.5–15.5)
WBC: 15.8 10*3/uL — ABNORMAL HIGH (ref 4.0–10.5)

## 2016-06-15 LAB — COOXEMETRY PANEL
CARBOXYHEMOGLOBIN: 1.6 % — AB (ref 0.5–1.5)
METHEMOGLOBIN: 0.6 % (ref 0.0–1.5)
O2 SAT: 62 %
Total hemoglobin: 14.5 g/dL (ref 12.0–16.0)

## 2016-06-15 LAB — MAGNESIUM: Magnesium: 2.3 mg/dL (ref 1.7–2.4)

## 2016-06-15 LAB — HIV ANTIBODY (ROUTINE TESTING W REFLEX): HIV SCREEN 4TH GENERATION: NONREACTIVE

## 2016-06-15 LAB — PHOSPHORUS: Phosphorus: 5.3 mg/dL — ABNORMAL HIGH (ref 2.5–4.6)

## 2016-06-15 MED ORDER — MIDAZOLAM HCL 2 MG/2ML IJ SOLN
INTRAMUSCULAR | Status: AC
  Filled 2016-06-15: qty 4

## 2016-06-15 MED ORDER — ISOSORBIDE DINITRATE 10 MG PO TABS
5.0000 mg | ORAL_TABLET | Freq: Two times a day (BID) | ORAL | Status: DC
  Administered 2016-06-15 – 2016-06-24 (×18): 5 mg via ORAL
  Filled 2016-06-15 (×18): qty 1

## 2016-06-15 MED ORDER — HYDRALAZINE HCL 10 MG PO TABS
10.0000 mg | ORAL_TABLET | Freq: Three times a day (TID) | ORAL | Status: DC
  Administered 2016-06-16 – 2016-06-24 (×18): 10 mg via ORAL
  Filled 2016-06-15 (×28): qty 1

## 2016-06-15 MED ORDER — MIDAZOLAM HCL 2 MG/2ML IJ SOLN
8.0000 mg | Freq: Once | INTRAMUSCULAR | Status: AC
  Administered 2016-06-15: 8 mg via INTRAVENOUS

## 2016-06-15 MED ORDER — DEXTROSE 5 % IV SOLN
INTRAVENOUS | Status: DC
  Administered 2016-06-15 – 2016-06-16 (×2): via INTRAVENOUS

## 2016-06-15 MED ORDER — ISOSORBIDE MONONITRATE 15 MG HALF TABLET
15.0000 mg | ORAL_TABLET | Freq: Every day | ORAL | Status: DC
  Filled 2016-06-15: qty 1

## 2016-06-15 MED ORDER — FREE WATER
200.0000 mL | Status: DC
  Administered 2016-06-15 – 2016-06-16 (×5): 200 mL

## 2016-06-15 MED ORDER — FENTANYL CITRATE (PF) 100 MCG/2ML IJ SOLN
100.0000 ug | Freq: Once | INTRAMUSCULAR | Status: AC
  Administered 2016-06-15: 100 ug via INTRAVENOUS

## 2016-06-15 MED ORDER — FENTANYL CITRATE (PF) 100 MCG/2ML IJ SOLN
INTRAMUSCULAR | Status: AC
  Administered 2016-06-15: 14:00:00
  Filled 2016-06-15: qty 2

## 2016-06-15 MED ORDER — FENTANYL CITRATE (PF) 100 MCG/2ML IJ SOLN
INTRAMUSCULAR | Status: AC
  Filled 2016-06-15: qty 2

## 2016-06-15 MED ORDER — MIDAZOLAM HCL 2 MG/2ML IJ SOLN
INTRAMUSCULAR | Status: AC
  Administered 2016-06-15: 14:00:00
  Filled 2016-06-15: qty 8

## 2016-06-15 MED ORDER — FREE WATER
200.0000 mL | Freq: Three times a day (TID) | Status: DC
  Administered 2016-06-15: 200 mL

## 2016-06-15 NOTE — Progress Notes (Signed)
RT note- placed back to full support for procedure, TEE.

## 2016-06-15 NOTE — Progress Notes (Signed)
Patient Name: [name]      SUBJECTIVE without complaints of chest pain.  Spoke with DM  Today  Will proceed with TEE  Pt asking re anything to be done to help with diaphagmatic paralysis  Past Medical History:  Diagnosis Date  . Arthritis    GOUT  . Automatic implantable cardioverter-defibrillator in situ   . Biventricular ICD (implantable cardiac defibrillator) [employer] ]    DOI 2008/ upgrade 2010/ Gen Change 2014  . Bladder tumor    PT HOSP AT [facility] 4/24 TO 4/26 2014 WITH UTI--AND FOUND TO HAVE BLADDER TUMOR-  . Cardiomyopathy    Idiopathic dilated;   . CHF (congestive heart failure) ([place])   . CKD (chronic kidney disease)    stage III baseline Crt 1.8-2.0  . Coronary artery disease   . Gout 08/03/12   PT C/O OF RIGHT KNEE PAIN AND SWELLING -STATES GOUT FLARE UP - ONGOING SINCE APRIL - BUT SWELLING W/IN LAST WEEK  . Hilar mass    Noted CT 2010  . HTN (hypertension)    Dr. Caryl Comes cardiologist  . Hypothyroidism   . 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 Sleep apnea    CPAP  . Systolic CHF ([place])    EF 41%  . Ventricular tachycardia (HCC)    s/p ICD    Scheduled Meds:  Scheduled Meds: . carvedilol  6.25 mg Oral BID WC  . cefTRIAXone (ROCEPHIN)  IV  2 g Intravenous Q24H  . chlorhexidine gluconate (MEDLINE KIT)  15 mL Mouth Rinse BID  . Chlorhexidine Gluconate Cloth  6 each Topical Daily  . feeding supplement (VITAL HIGH PROTEIN)  1,000 mL Per Tube Q24H  . free water  200 mL Per Tube Q4H  . heparin  5,000 Units Subcutaneous Q8H  . hydrALAZINE  10 mg Oral Q8H  . insulin aspart  2-6 Units Subcutaneous Q4H  . isosorbide dinitrate  5 mg Oral BID  . mouth rinse  15 mL Mouth Rinse QID  . pantoprazole sodium  40 mg Per Tube Daily  . pneumococcal 23 valent vaccine  0.5 mL Intramuscular Tomorrow-1000  . sodium chloride flush  10-40 mL Intracatheter Q12H  . tobramycin-dexamethasone   Right Eye BID    Continuous Infusions: . sodium chloride 83.3 mL/hr at 06/14/16 2200  . sodium chloride 10 mL/hr at 06/10/16 1203  . dextrose 100 mL/hr at 06/15/16 0757  . fentaNYL infusion INTRAVENOUS Stopped (06/13/16 1745)   fentaNYL, midazolam, sodium chloride flush    PHYSICAL EXAM Vitals:   06/15/16 1946 06/15/16 1948 06/15/16 2000 06/15/16 2100  BP: 95/74  106/73 99/75  Pulse: 85  83 86  Resp: (!) 21  (!) 25 (!) 21  Temp:  97.4 F (36.3 C)    TempSrc:  Axillary    SpO2: 100%  100% 100%  Weight:      Height:       Well developed and nourished iintubated ENT normal Neck supple with JVP-flat Clear Rapid but regular rate and rhythm, 2/6  murmur Abd-soft with active BS No Clubbing cyanosis edema Skin-warm and dry A & Oriented  Grossly normal sensory and motor function  TELEMETRY: Reviewed personally pt in sinus tach     Intake/Output Summary (Last 24 hours) at 06/15/16 2201 Last data filed at 06/15/16 2100  Gross per 24 hour  Intake           2726.6 ml  Output             1175 ml  Net           1551.6 ml    LABS: Basic Metabolic Panel:  Personally reviewed    Recent Labs Lab 06/11/16 0435 06/12/16 0350 06/13/16 0504 06/14/16 0347 06/15/16 0520 06/15/16 1300 06/15/16 1950  NA 145 145 145 146* 150* 149* 143  K 3.8 3.9 4.0 3.8 3.9 4.1 3.7  CL 103 101 100* 99* 102 101 100*  CO2 _0 GLUCOSE 116* 117* 142* 154* 152* 133* 209*  BUN 79* 90* 112* 132* 160* 160* 153*  CREATININE 3.49* 3.29* 3.64* 3.78* 3.73* 3.79* 3.61*  CALCIUM 9.1 9.5 9.6 9.9 9.9 10.0 9.5  MG 2.2 2.0 1.9 2.1 2.3  --   --   PHOS 3.1 4.1  4.2 4.7*  4.8* 6.0* 5.4*  5.3*  --   --    Cardiac Enzymes: No results for input(s): CKTOTAL, CKMB, CKMBINDEX, TROPONINI in the last 72 hours. CBC:  Recent Labs Lab 06/09/16 0534 06/09/16 1025 06/10/16 0737 06/11/16 0500 06/12/16 0350 06/13/16 0504 06/14/16 0347 06/15/16 0520  WBC 13.9*  --  13.1* 13.7* 15.7* 17.6* 18.5* 15.8*   NEUTROABS  --  10.2*  --   --   --   --   --   --   HGB 14.6  --  13.9 13.5 14.1 14.5 14.4 13.8  HCT 44.8  --  41.3 41.9 42.5 43.9 43.7 41.9  MCV 88.5  --  87.5 89.1 89.3 89.4 89.2 89.3  PLT 153  --  154 179 198 205 230 235   PROTIME: No results for input(s): LABPROT, INR in the last 72 hours. Liver Function Tests:  Recent Labs  06/14/16 0347 06/15/16 0520  ALBUMIN 2.9* 2.5*   No results for input(s): LIPASE, AMYLASE in the last 72 hours. BNP: BNP (last 3 results)  Recent Labs  06/06/16 1645 06/06/16 1925  BNP 796.0* 553.0*    ProBNP (last 3 results) No results for input(s): PROBNP in the last 8760 hours.     ASSESSMENT AND PLAN:  Active Problems:   SYSTOLIC HEART FAILURE, CHRONIC   Automatic implantable cardioverter-defibrillator in situ   Diaphragm paralysis   Acute respiratory failure with hypoxia (HCC)   Pressure injury of skin   Acute respiratory failure with hypoxemia (HCC)   Bacteremia due to Streptococcus   Central line infection   Infected defibrillator [facility])   History of ETT  Spoke with pt regarding treatments of his diaphragm issue,   Will have to explore Agree with need to do TEE and now is the time while intubated  Hopefully clear Hope he can get extubated   Signed, Virl Axe MD  06/15/2016

## 2016-06-15 NOTE — Progress Notes (Signed)
Subjective: intubated   Antibiotics:  Anti-infectives    Start     Dose/Rate Route Frequency Ordered Stop   06/09/16 1700  cefTRIAXone (ROCEPHIN) 2 g in dextrose 5 % 50 mL IVPB     2 g 100 mL/hr over 30 Minutes Intravenous Every 24 hours 06/09/16 1039 06/22/16 2359   06/09/16 1200  azithromycin (ZITHROMAX) 500 mg in dextrose 5 % 250 mL IVPB     500 mg 250 mL/hr over 60 Minutes Intravenous Every 24 hours 06/09/16 1039 06/13/16 1346   06/07/16 1900  vancomycin (VANCOCIN) 1,250 mg in sodium chloride 0.9 % 250 mL IVPB  Status:  Discontinued     1,250 mg 166.7 mL/hr over 90 Minutes Intravenous Every 24 hours 06/06/16 1751 06/08/16 1208   06/07/16 0200  piperacillin-tazobactam (ZOSYN) IVPB 3.375 g  Status:  Discontinued     3.375 g 12.5 mL/hr over 240 Minutes Intravenous Every 8 hours 06/06/16 1749 06/09/16 1038   06/06/16 1830  piperacillin-tazobactam (ZOSYN) IVPB 3.375 g  Status:  Discontinued     3.375 g 100 mL/hr over 30 Minutes Intravenous  Once 06/06/16 1817 06/06/16 1826   06/06/16 1830  vancomycin (VANCOCIN) IVPB 1000 mg/200 mL premix  Status:  Discontinued     1,000 mg 200 mL/hr over 60 Minutes Intravenous  Once 06/06/16 1817 06/06/16 1826   06/06/16 1800  vancomycin (VANCOCIN) 2,000 mg in sodium chloride 0.9 % 500 mL IVPB     2,000 mg 250 mL/hr over 120 Minutes Intravenous  Once 06/06/16 1748 06/06/16 2023   06/06/16 1745  piperacillin-tazobactam (ZOSYN) IVPB 3.375 g     3.375 g 100 mL/hr over 30 Minutes Intravenous  Once 06/06/16 1744 06/06/16 1823   06/06/16 1745  vancomycin (VANCOCIN) IVPB 1000 mg/200 mL premix  Status:  Discontinued     1,000 mg 200 mL/hr over 60 Minutes Intravenous  Once 06/06/16 1744 06/06/16 1748      Medications: Scheduled Meds: . carvedilol  6.25 mg Oral BID WC  . cefTRIAXone (ROCEPHIN)  IV  2 g Intravenous Q24H  . chlorhexidine gluconate (MEDLINE KIT)  15 mL Mouth Rinse BID  . Chlorhexidine Gluconate Cloth  6 each Topical Daily    . feeding supplement (VITAL HIGH PROTEIN)  1,000 mL Per Tube Q24H  . free water  200 mL Per Tube Q4H  . heparin  5,000 Units Subcutaneous Q8H  . hydrALAZINE  10 mg Oral Q8H  . insulin aspart  2-6 Units Subcutaneous Q4H  . isosorbide mononitrate  15 mg Oral Daily  . mouth rinse  15 mL Mouth Rinse QID  . pantoprazole sodium  40 mg Per Tube Daily  . pneumococcal 23 valent vaccine  0.5 mL Intramuscular Tomorrow-1000  . sodium chloride flush  10-40 mL Intracatheter Q12H  . tobramycin-dexamethasone   Right Eye BID   Continuous Infusions: . sodium chloride 83.3 mL/hr at 06/14/16 2200  . sodium chloride 10 mL/hr at 06/10/16 1203  . dextrose 100 mL/hr at 06/15/16 0757  . fentaNYL infusion INTRAVENOUS Stopped (06/13/16 1745)   PRN Meds:.fentaNYL, midazolam, sodium chloride flush    Objective: Weight change: 3 lb 1.4 oz (1.4 kg)  Intake/Output Summary (Last 24 hours) at 06/15/16 1752 Last data filed at 06/15/16 1600  Gross per 24 hour  Intake           2239.9 ml  Output             1025 ml  Net  1214.9 ml   Blood pressure (!) 88/68, pulse 60, temperature 97.6 F (36.4 C), temperature source Oral, resp. rate (!) 40, height '5\' 6"'$  (1.676 m), weight 216 lb 14.9 oz (98.4 kg), SpO2 100 %. Temp:  [97.6 F (36.4 C)-98.5 F (36.9 C)] 97.6 F (36.4 C) (04/11 1604) Pulse Rate:  [45-105] 60 (04/11 1600) Resp:  [18-40] 40 (04/11 1600) BP: (79-121)/(59-97) 88/68 (04/11 1600) SpO2:  [95 %-100 %] 100 % (04/11 1630) FiO2 (%):  [30 %-100 %] 40 % (04/11 1630) Weight:  [216 lb 14.9 oz (98.4 kg)] 216 lb 14.9 oz (98.4 kg) (04/11 0500)  Physical Exam: General: Sleepy after TEE on the ventilator HEENT: anicteric sclera, pupils reactive to light and accommodation intubated CVS regular rate, normal r,  no murmur rubs or gallops Chest:  no wheezing, rales or rhonchi Abdomen: soft nontender, nondistended, normal bowel sounds, Extremities: no  clubbing or edema noted bilaterally Skin: no  rashes  Neuro: nonfocal  CBC:  CBC Latest Ref Rng & Units 06/15/2016 06/14/2016 06/13/2016  WBC 4.0 - 10.5 K/uL 15.8(H) 18.5(H) 17.6(H)  Hemoglobin 13.0 - 17.0 g/dL 13.8 14.4 14.5  Hematocrit 39.0 - 52.0 % 41.9 43.7 43.9  Platelets 150 - 400 K/uL 235 230 205     BMET  Recent Labs  06/15/16 0520 06/15/16 1300  NA 150* 149*  K 3.9 4.1  CL 102 101  CO2 29 29  GLUCOSE 152* 133*  BUN 160* 160*  CREATININE 3.73* 3.79*  CALCIUM 9.9 10.0     Liver Panel   Recent Labs  06/14/16 0347 06/15/16 0520  ALBUMIN 2.9* 2.5*       Sedimentation Rate No results for input(s): ESRSEDRATE in the last 72 hours. C-Reactive Protein No results for input(s): CRP in the last 72 hours.  Micro Results: Recent Results (from the past 720 hour(s))  Culture, blood (x 2)     Status: Abnormal (Preliminary result)   Collection Time: 06/06/16  5:50 PM  Result Value Ref Range Status   Specimen Description BLOOD RIGHT ANTECUBITAL  Final   Special Requests IN PEDIATRIC BOTTLE Blood Culture adequate volume  Final   Culture  Setup Time   Final    GRAM POSITIVE COCCI IN CHAINS IN PAIRS IN PEDIATRIC BOTTLE CRITICAL RESULT CALLED TO, READ BACK BY AND VERIFIED WITH: CARON AMEND,PHARMD '@0008'$  06/08/16 MKELLY,MLT    Culture (A)  Final    VIRIDANS STREPTOCOCCUS SUSCEPTIBILITIES TO FOLLOW    Report Status PENDING  Incomplete  Blood Culture (routine x 2)     Status: None   Collection Time: 06/06/16  6:21 PM  Result Value Ref Range Status   Specimen Description BLOOD RIGHT HAND  Final   Special Requests   Final    BOTTLES DRAWN AEROBIC ONLY Blood Culture adequate volume   Culture NO GROWTH 5 DAYS  Final   Report Status 06/11/2016 FINAL  Final  MRSA PCR Screening     Status: None   Collection Time: 06/06/16  8:44 PM  Result Value Ref Range Status   MRSA by PCR NEGATIVE NEGATIVE Final    Comment:        The GeneXpert MRSA Assay (FDA approved for NASAL specimens only), is one component of  a comprehensive MRSA colonization surveillance program. It is not intended to diagnose MRSA infection nor to guide or monitor treatment for MRSA infections.   Culture, blood (routine x 2)     Status: None   Collection Time: 06/09/16 10:25 AM  Result Value  Ref Range Status   Specimen Description BLOOD RIGHT HAND  Final   Special Requests IN PEDIATRIC BOTTLE Blood Culture adequate volume  Final   Culture NO GROWTH 5 DAYS  Final   Report Status 06/14/2016 FINAL  Final  Culture, blood (routine x 2)     Status: None   Collection Time: 06/09/16 10:31 AM  Result Value Ref Range Status   Specimen Description BLOOD RIGHT THUMB  Final   Special Requests IN PEDIATRIC BOTTLE Blood Culture adequate volume  Final   Culture NO GROWTH 5 DAYS  Final   Report Status 06/14/2016 FINAL  Final  Culture, respiratory (NON-Expectorated)     Status: None (Preliminary result)   Collection Time: 06/13/16  8:35 PM  Result Value Ref Range Status   Specimen Description TRACHEAL ASPIRATE  Final   Special Requests NONE  Final   Gram Stain   Final    FEW WBC PRESENT,BOTH PMN AND MONONUCLEAR NO ORGANISMS SEEN    Culture CULTURE REINCUBATED FOR BETTER GROWTH  Final   Report Status PENDING  Incomplete    Studies/Results: Dg Chest Port 1 View  Result Date: 06/15/2016 CLINICAL DATA:  56 year old male status post endotracheal tube placement on ventilator. EXAM: PORTABLE CHEST 1 VIEW COMPARISON:  Chest x-ray 06/13/2016. FINDINGS: An endotracheal tube is in place with tip 4.1 cm above the carina. There is a right-sided internal jugular central venous catheter with tip terminating in the superior cavoatrial junction. Left-sided pacemaker/AICD with lead tips projecting over the expected location of the right atrium and right ventricular apex. Low lung volumes. Improved aeration throughout the lungs bilaterally with resolution of much of the previously noted multifocal interstitial and airspace disease, presumably  reflective of resolving edema and/or multifocal pneumonia. Trace right pleural effusion. No left pleural effusion. Cephalization of the pulmonary vasculature, crowding of the pulmonary vasculature, likely accentuated by the low lung volumes. Heart size remains mildly enlarged. Upper mediastinal contours are within normal limits. Multiple surgical clips project over the thoracic inlet and the medial upper right hemithorax. Multiple old healed right-sided rib fractures. IMPRESSION: 1. Support apparatus, as above. 2. Overall, there is significant improvement compared to the examination from 2 days ago with near complete resolution of edema and/or multilobar pneumonia seen on the prior study. 3. Mild cardiomegaly. 4. Trace right pleural effusion. Electronically Signed   By: Vinnie Langton M.D.   On: 06/15/2016 13:41   Dg Abd Portable 1v  Result Date: 06/15/2016 CLINICAL DATA:  Feeding tube placement. EXAM: PORTABLE ABDOMEN - 1 VIEW COMPARISON:  Supine abdominal radiograph of June 09, 2016 FINDINGS: The radiodense feeding tube has replaced the esophagogastric tube. The tip of the feeding tube lies in the region of the third portion of the duodenum. IMPRESSION: The feeding tube tip now lies in the region of the third portion of the duodenum. Electronically Signed   By: David  Martinique M.D.   On: 06/15/2016 17:15      Assessment/Plan:  INTERVAL HISTORY:  TEE without vegetations on leads or valves   Active Problems:   SYSTOLIC HEART FAILURE, CHRONIC   Automatic implantable cardioverter-defibrillator in situ   Diaphragm paralysis   Acute respiratory failure with hypoxia (HCC)   Pressure injury of skin   Acute respiratory failure with hypoxemia (HCC)   Bacteremia due to Streptococcus   Central line infection   Infected defibrillator [facility])   History of ETT    [name] is a 56 y.o. male with virdans streptococcal bacteremia and sepsis and  concern for ICD infection. TEE was negative  today  #1 Viridans group streptococcal bacteremia and sepsis: --followup sensis on viridans group strep --continue Ceftriaxone --would give him 2 weeks of effective antibiotics from date of first negative cultures = 06/09/16 (note 3 lumen placed AFTER blood cultures cleared)     LOS: 9 days   Alcide Evener 06/15/2016, 5:52 PM

## 2016-06-15 NOTE — Progress Notes (Signed)
ATTENDING NOTE: I have personally reviewed patient's available data, including medical history, events of note, physical examination and test results as part of my evaluation. I have discussed with resident/NP and other careteam providers such as pharmacist, RN and RRT &co-ordinated with consultants. 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 He continues to have fevers to 102  D#10 ventilation On exam-awake and alert, S1-S2 normal, decreased breath sounds bilateral, soft nontender abdomen, no JVD or edema  Chest x-ray shows ET tube in good position and ? Right middle lobe infiltrate, no effusions Labs show mild hypokalemia, increase in creatinine to 3.6, mild thrombocytopenia.  Impression/plan Acute respiratory failure-failed extubation 4/5,likely due to low cardiac output and probably poor lung reserve from right elevated diaphragm,he does have some fracture ribs but no obvious flail chest on exam.  Continue spontaneous breathing trials,  he is only tolerating high pressure support ,would consider another trial of extubation only if we are able to get him down to 5/5  Severe cardiomyopathy- Co- oximetry normal range, milrinone deferred, repeat echo shows EF 20%, would suggest hydralazine and nitrates if cardiology in agreement  Cardiorenal syndrome/CK D stage IV-Lasix held due to rising BUN, aim for a equal balance  Persistent fever and leukocytosis- strep viridans 1/2 on initial cultures/2, follow-up cultures were negative but ID still recommending  TEE  - wife updated at bedside Rest per medical resident whose note is outlined above and that I agree with and edited in full.    The patient is critically ill with multiple organ systems failure and requires high complexity decision making for assessment and support, frequent evaluation and titration of therapies, application of advanced monitoring technologies and extensive interpretation of multiple databases. Critical Care Time devoted to patient care services described in this note independent of resident time is 51minutes.     Rigoberto Noel MD

## 2016-06-15 NOTE — Progress Notes (Signed)
Paged by nursing staff regarding the Imdur which cannot be crushed to deliver through [facility]. I discussed with our clinical pharmacist who recommended low dose isosorbide dinitrate, note Bidil is listed as allergy due to SE of headache. We will try to start at lowest possible dose.   Hilbert Corrigan PA Pager: [phone]

## 2016-06-15 NOTE — H&P (View-Only) (Signed)
Advanced Heart Failure Rounding Note   Subjective:    Awake on vent, HR sinus in the 90s. Co ox stable at 62%. He is weaning this morning on the vent.   Creatinine 3.49->3.29->3.64->3.78->3.73 BUN remains high, up to 160 today. Weight up 3 pounds, diuretics stopped per renal, now getting hydration. CVP is 5. Getting free water for hypernatremia.   Off sedation, nods appropriately.   Objective:   Weight Range:  Vital Signs:   Temp:  [98 F (36.7 C)-98.5 F (36.9 C)] 98.5 F (36.9 C) (04/11 0419) Pulse Rate:  [45-136] 92 (04/11 0700) Resp:  [18-33] 22 (04/11 0700) BP: (79-128)/(59-100) 95/63 (04/11 0700) SpO2:  [95 %-100 %] 100 % (04/11 0700) FiO2 (%):  [30 %] 30 % (04/11 0400) Weight:  [216 lb 14.9 oz (98.4 kg)] 216 lb 14.9 oz (98.4 kg) (04/11 0500) Last BM Date: 06/14/16  Weight change: Filed Weights   06/13/16 0500 06/14/16 0417 06/15/16 0500  Weight: 214 lb 15.2 oz (97.5 kg) 213 lb 13.5 oz (97 kg) 216 lb 14.9 oz (98.4 kg)    Intake/Output:   Intake/Output Summary (Last 24 hours) at 06/15/16 0819 Last data filed at 06/15/16 0600  Gross per 24 hour  Intake           1119.9 ml  Output             1125 ml  Net             -5.1 ml     Physical Exam: General: Intubated male, awake on vent.  HEENT:R eye opaque, ETT in place.  Neck: supple. LIJ central line, no JVP.    Cor: PMI laterally displaced, Regular rate and rhythm.  Lungs: Rhonchi in upper lobes.  Abdomen: obese soft, nontender, nondistended. Positive bowel sounds.  Extremities: no cyanosis, clubbing, rash. No edema.  Neuro: alert & orientedx3, cranial nerves grossly intact. moves all 4 extremities w/o difficulty. Affect pleasant  Telemetry: Sinus Tach rates in the 100's.   Labs: Basic Metabolic Panel:  Recent Labs Lab 06/11/16 0435 06/12/16 0350 06/13/16 0504 06/14/16 0347 06/15/16 0520  NA 145 145 145 146* 150*  K 3.8 3.9 4.0 3.8 3.9  CL 103 101 100* 99* 102  CO2 '28 29 28 29 29  '$ GLUCOSE  116* 117* 142* 154* 152*  BUN 79* 90* 112* 132* 160*  CREATININE 3.49* 3.29* 3.64* 3.78* 3.73*  CALCIUM 9.1 9.5 9.6 9.9 9.9  MG 2.2 2.0 1.9 2.1 2.3  PHOS 3.1 4.1  4.2 4.7*  4.8* 6.0* 5.4*  5.3*    Liver Function Tests:  Recent Labs Lab 06/11/16 0435 06/12/16 0350 06/13/16 0504 06/14/16 0347 06/15/16 0520  ALBUMIN 2.6* 2.7* 3.0* 2.9* 2.5*    CBC:  Recent Labs Lab 06/09/16 1025  06/11/16 0500 06/12/16 0350 06/13/16 0504 06/14/16 0347 06/15/16 0520  WBC  --   < > 13.7* 15.7* 17.6* 18.5* 15.8*  NEUTROABS 10.2*  --   --   --   --   --   --   HGB  --   < > 13.5 14.1 14.5 14.4 13.8  HCT  --   < > 41.9 42.5 43.9 43.7 41.9  MCV  --   < > 89.1 89.3 89.4 89.2 89.3  PLT  --   < > 179 198 205 230 235  < > = values in this interval not displayed.   BNP: BNP (last 3 results)  Recent Labs  06/06/16 1645 06/06/16 1925  BNP 796.0* 553.0*      Transthoracic Echocardiography 06/10/16 Study Conclusions  - Left ventricle: The cavity size was severely dilated. Wall   thickness was normal. The estimated ejection fraction was 20%.   Diffuse hypokinesis. - Aortic valve: There was mild regurgitation. - Mitral valve: There was moderate regurgitation. - Left atrium: The atrium was moderately dilated. - Atrial septum: No defect or patent foramen ovale was identified.      Medications:     Scheduled Medications: . carvedilol  6.25 mg Oral BID WC  . cefTRIAXone (ROCEPHIN)  IV  2 g Intravenous Q24H  . chlorhexidine gluconate (MEDLINE KIT)  15 mL Mouth Rinse BID  . Chlorhexidine Gluconate Cloth  6 each Topical Daily  . feeding supplement (VITAL HIGH PROTEIN)  1,000 mL Per Tube Q24H  . fentaNYL (SUBLIMAZE) injection  50 mcg Intravenous Once  . free water  200 mL Per Tube Q4H  . heparin  5,000 Units Subcutaneous Q8H  . insulin aspart  2-6 Units Subcutaneous Q4H  . mouth rinse  15 mL Mouth Rinse QID  . pantoprazole sodium  40 mg Per Tube Daily  . pneumococcal 23 valent  vaccine  0.5 mL Intramuscular Tomorrow-1000  . sodium chloride flush  10-40 mL Intracatheter Q12H  . tobramycin-dexamethasone   Right Eye BID    Infusions: . sodium chloride 83.3 mL/hr at 06/14/16 2200  . sodium chloride 10 mL/hr at 06/10/16 1203  . dextrose 100 mL/hr at 06/15/16 0757  . fentaNYL infusion INTRAVENOUS Stopped (06/13/16 1745)    PRN Medications: fentaNYL, midazolam, sodium chloride flush   Assessment:   1. Septic shock: treated for CAP.  2. Acute hypoxic respiratory failure 3. Acute on chronic renal failure 4. Chronic systolic HF EF 14% on echo 06/09/16 5. Sinus tachycardia 6. Paralyzed hemidiaphragm  7. h/o paraganglioma resection  Plan/Discussion:    Remains on abx for bronchitis/PNA. Co ox remains stable, adequate cardiac output. CVP is 5. Getting hydration today and diuretics stopped. Weaning on vent this am. Getting free water for hypernatremia.   Length of Stay: [place] NP 06/15/2016, 8:19 AM Advanced Heart Failure Team  Pager [phone] M-F 7am-4pm.  Please contact [facility] for night-coverage after hours (4p -7a ) and weekends on [email or web address]  Patient seen with NP, agree with the above note.  BUN/creatinine up again, CVP around 5.  Would continue to hold Lasix.  Good co-ox suggests adequate cardiac output.  Think PNA is the main issue as mentioned in yesterday's note.  - Can continue Coreg.  - OK to try low dose hydralazine/nitrates.  Hold if SBP drops < 100.   He will get TEE today to rule out endocarditis/device involvement by Strep viridans.   Loralie Champagne 06/15/2016

## 2016-06-15 NOTE — Progress Notes (Signed)
Advanced Heart Failure Rounding Note   Subjective:    Awake on vent, HR sinus in the 90s. Co ox stable at 62%. He is weaning this morning on the vent.   Creatinine 3.49->3.29->3.64->3.78->3.73 BUN remains high, up to 160 today. Weight up 3 pounds, diuretics stopped per renal, now getting hydration. CVP is 5. Getting free water for hypernatremia.   Off sedation, nods appropriately.   Objective:   Weight Range:  Vital Signs:   Temp:  [98 F (36.7 C)-98.5 F (36.9 C)] 98.5 F (36.9 C) (04/11 0419) Pulse Rate:  [45-136] 92 (04/11 0700) Resp:  [18-33] 22 (04/11 0700) BP: (79-128)/(59-100) 95/63 (04/11 0700) SpO2:  [95 %-100 %] 100 % (04/11 0700) FiO2 (%):  [30 %] 30 % (04/11 0400) Weight:  [216 lb 14.9 oz (98.4 kg)] 216 lb 14.9 oz (98.4 kg) (04/11 0500) Last BM Date: 06/14/16  Weight change: Filed Weights   06/13/16 0500 06/14/16 0417 06/15/16 0500  Weight: 214 lb 15.2 oz (97.5 kg) 213 lb 13.5 oz (97 kg) 216 lb 14.9 oz (98.4 kg)    Intake/Output:   Intake/Output Summary (Last 24 hours) at 06/15/16 0819 Last data filed at 06/15/16 0600  Gross per 24 hour  Intake           1119.9 ml  Output             1125 ml  Net             -5.1 ml     Physical Exam: General: Intubated male, awake on vent.  HEENT:R eye opaque, ETT in place.  Neck: supple. LIJ central line, no JVP.    Cor: PMI laterally displaced, Regular rate and rhythm.  Lungs: Rhonchi in upper lobes.  Abdomen: obese soft, nontender, nondistended. Positive bowel sounds.  Extremities: no cyanosis, clubbing, rash. No edema.  Neuro: alert & orientedx3, cranial nerves grossly intact. moves all 4 extremities w/o difficulty. Affect pleasant  Telemetry: Sinus Tach rates in the 100's.   Labs: Basic Metabolic Panel:  Recent Labs Lab 06/11/16 0435 06/12/16 0350 06/13/16 0504 06/14/16 0347 06/15/16 0520  NA 145 145 145 146* 150*  K 3.8 3.9 4.0 3.8 3.9  CL 103 101 100* 99* 102  CO2 '28 29 28 29 29  '$ GLUCOSE  116* 117* 142* 154* 152*  BUN 79* 90* 112* 132* 160*  CREATININE 3.49* 3.29* 3.64* 3.78* 3.73*  CALCIUM 9.1 9.5 9.6 9.9 9.9  MG 2.2 2.0 1.9 2.1 2.3  PHOS 3.1 4.1  4.2 4.7*  4.8* 6.0* 5.4*  5.3*    Liver Function Tests:  Recent Labs Lab 06/11/16 0435 06/12/16 0350 06/13/16 0504 06/14/16 0347 06/15/16 0520  ALBUMIN 2.6* 2.7* 3.0* 2.9* 2.5*    CBC:  Recent Labs Lab 06/09/16 1025  06/11/16 0500 06/12/16 0350 06/13/16 0504 06/14/16 0347 06/15/16 0520  WBC  --   < > 13.7* 15.7* 17.6* 18.5* 15.8*  NEUTROABS 10.2*  --   --   --   --   --   --   HGB  --   < > 13.5 14.1 14.5 14.4 13.8  HCT  --   < > 41.9 42.5 43.9 43.7 41.9  MCV  --   < > 89.1 89.3 89.4 89.2 89.3  PLT  --   < > 179 198 205 230 235  < > = values in this interval not displayed.   BNP: BNP (last 3 results)  Recent Labs  06/06/16 1645 06/06/16 1925  BNP 796.0* 553.0*      Transthoracic Echocardiography 06/10/16 Study Conclusions  - Left ventricle: The cavity size was severely dilated. Wall   thickness was normal. The estimated ejection fraction was 20%.   Diffuse hypokinesis. - Aortic valve: There was mild regurgitation. - Mitral valve: There was moderate regurgitation. - Left atrium: The atrium was moderately dilated. - Atrial septum: No defect or patent foramen ovale was identified.      Medications:     Scheduled Medications: . carvedilol  6.25 mg Oral BID WC  . cefTRIAXone (ROCEPHIN)  IV  2 g Intravenous Q24H  . chlorhexidine gluconate (MEDLINE KIT)  15 mL Mouth Rinse BID  . Chlorhexidine Gluconate Cloth  6 each Topical Daily  . feeding supplement (VITAL HIGH PROTEIN)  1,000 mL Per Tube Q24H  . fentaNYL (SUBLIMAZE) injection  50 mcg Intravenous Once  . free water  200 mL Per Tube Q4H  . heparin  5,000 Units Subcutaneous Q8H  . insulin aspart  2-6 Units Subcutaneous Q4H  . mouth rinse  15 mL Mouth Rinse QID  . pantoprazole sodium  40 mg Per Tube Daily  . pneumococcal 23 valent  vaccine  0.5 mL Intramuscular Tomorrow-1000  . sodium chloride flush  10-40 mL Intracatheter Q12H  . tobramycin-dexamethasone   Right Eye BID    Infusions: . sodium chloride 83.3 mL/hr at 06/14/16 2200  . sodium chloride 10 mL/hr at 06/10/16 1203  . dextrose 100 mL/hr at 06/15/16 0757  . fentaNYL infusion INTRAVENOUS Stopped (06/13/16 1745)    PRN Medications: fentaNYL, midazolam, sodium chloride flush   Assessment:   1. Septic shock: treated for CAP.  2. Acute hypoxic respiratory failure 3. Acute on chronic renal failure 4. Chronic systolic HF EF 83% on echo 06/09/16 5. Sinus tachycardia 6. Paralyzed hemidiaphragm  7. h/o paraganglioma resection  Plan/Discussion:    Remains on abx for bronchitis/PNA. Co ox remains stable, adequate cardiac output. CVP is 5. Getting hydration today and diuretics stopped. Weaning on vent this am. Getting free water for hypernatremia.   Length of Stay: [place] NP 06/15/2016, 8:19 AM Advanced Heart Failure Team  Pager [phone] M-F 7am-4pm.  Please contact [facility] for night-coverage after hours (4p -7a ) and weekends on [email or web address]  Patient seen with NP, agree with the above note.  BUN/creatinine up again, CVP around 5.  Would continue to hold Lasix.  Good co-ox suggests adequate cardiac output.  Think PNA is the main issue as mentioned in yesterday's note.  - Can continue Coreg.  - OK to try low dose hydralazine/nitrates.  Hold if SBP drops < 100.   He will get TEE today to rule out endocarditis/device involvement by Strep viridans.   Loralie Champagne 06/15/2016

## 2016-06-15 NOTE — Progress Notes (Signed)
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 8 Hypernatremia 9 Bacteremia, strep  P Free water ordered  Subjective: Interval History: Thirsty, alert  Objective: Vital signs in last 24 hours: Temp:  [98 F (36.7 C)-98.6 F (37 C)] 98.5 F (36.9 C) (04/11 0419) Pulse Rate:  [45-136] 91 (04/11 0630) Resp:  [18-33] 22 (04/11 0630) BP: (79-128)/(59-100) 103/69 (04/11 0630) SpO2:  [95 %-100 %] 100 % (04/11 0630) FiO2 (%):  [30 %] 30 % (04/11 0400) Weight:  [98.4 kg (216 lb 14.9 oz)] 98.4 kg (216 lb 14.9 oz) (04/11 0500) Weight change: 1.4 kg (3 lb 1.4 oz)  Intake/Output from previous day: 04/10 0701 - 04/11 0700 In: 1159.9 [I.V.:409.9; NG/GT:750] Out: 1425 [Urine:1100; Stool:325] Intake/Output this shift: No intake/output data recorded.  General appearance: alert Resp: clear to auscultation bilaterally Chest wall: no tenderness Extremities: extremities normal, atraumatic, no cyanosis or edema  Lab Results:  Recent Labs  06/13/16 0504 06/14/16 0347  WBC 17.6* 18.5*  HGB 14.5 14.4  HCT 43.9 43.7  PLT 205 230   BMET:  Recent Labs  06/14/16 0347 06/15/16 0520  NA 146* 150*  K 3.8 3.9  CL 99* 102  CO2 29 29  GLUCOSE 154* 152*  BUN 132* 160*  CREATININE 3.78* 3.73*  CALCIUM 9.9 9.9   No results for input(s): PTH in the last 72 hours. Iron Studies: No results for input(s): IRON, TIBC, TRANSFERRIN, FERRITIN in the last 72 hours. Studies/Results: No results found.  Scheduled: . carvedilol  6.25 mg Oral BID WC  . cefTRIAXone (ROCEPHIN)  IV  2 g Intravenous Q24H  . chlorhexidine gluconate (MEDLINE KIT)  15 mL Mouth Rinse BID  . Chlorhexidine Gluconate Cloth  6 each Topical Daily  . feeding supplement (VITAL HIGH PROTEIN)  1,000 mL Per Tube Q24H  . fentaNYL (SUBLIMAZE) injection  50 mcg Intravenous Once  . free water  200 mL Per Tube Q8H  . heparin  5,000 Units  Subcutaneous Q8H  . insulin aspart  2-6 Units Subcutaneous Q4H  . mouth rinse  15 mL Mouth Rinse QID  . pantoprazole sodium  40 mg Per Tube Daily  . pneumococcal 23 valent vaccine  0.5 mL Intramuscular Tomorrow-1000  . sodium chloride flush  10-40 mL Intracatheter Q12H  . tobramycin-dexamethasone   Right Eye BID    LOS: 9 days   Laterica Matarazzo C 06/15/2016,7:26 AM

## 2016-06-15 NOTE — Interval H&P Note (Signed)
History and Physical Interval Note:  06/15/2016 1:26 PM  [name]  has presented today for surgery, with the diagnosis of * No surgery found *  The various methods of treatment have been discussed with the patient and family. After consideration of risks, benefits and other options for treatment, the patient has consented to TEE as a surgical intervention .  The patient's history has been reviewed, patient examined, no change in status, stable for surgery.  I have reviewed the patient's chart and labs.  Questions were answered to the patient's satisfaction.     Maekayla Giorgio [employer]

## 2016-06-15 NOTE — Progress Notes (Signed)
SUBJECTIVE: The patient is awake on vent this morning. Denies pain.   CURRENT MEDICATIONS: . carvedilol  6.25 mg Oral BID WC  . cefTRIAXone (ROCEPHIN)  IV  2 g Intravenous Q24H  . chlorhexidine gluconate (MEDLINE KIT)  15 mL Mouth Rinse BID  . Chlorhexidine Gluconate Cloth  6 each Topical Daily  . feeding supplement (VITAL HIGH PROTEIN)  1,000 mL Per Tube Q24H  . fentaNYL (SUBLIMAZE) injection  50 mcg Intravenous Once  . free water  200 mL Per Tube Q4H  . heparin  5,000 Units Subcutaneous Q8H  . insulin aspart  2-6 Units Subcutaneous Q4H  . mouth rinse  15 mL Mouth Rinse QID  . pantoprazole sodium  40 mg Per Tube Daily  . pneumococcal 23 valent vaccine  0.5 mL Intramuscular Tomorrow-1000  . sodium chloride flush  10-40 mL Intracatheter Q12H  . tobramycin-dexamethasone   Right Eye BID   . sodium chloride 83.3 mL/hr at 06/14/16 2200  . sodium chloride 10 mL/hr at 06/10/16 1203  . dextrose 100 mL/hr at 06/15/16 0757  . fentaNYL infusion INTRAVENOUS Stopped (06/13/16 1745)    OBJECTIVE: Physical Exam: Vitals:   06/15/16 0800 06/15/16 0821 06/15/16 0822 06/15/16 0900  BP: 114/82   107/75  Pulse: 96   (!) 48  Resp: (!) 35   (!) 33  Temp:  98 F (36.7 C)    TempSrc:  Oral    SpO2: 95%  99% 98%  Weight:      Height:        Intake/Output Summary (Last 24 hours) at 06/15/16 1021 Last data filed at 06/15/16 0900  Gross per 24 hour  Intake           1259.9 ml  Output             1125 ml  Net            134.9 ml    Telemetry reveals sinus rhythm (personally reviewed)  GEN- The patient is intubated and sedated, alert on vent  Head- normocephalic, atraumatic Eyes-  Sclera clear, conjunctiva pink Ears- hearing intact Oropharynx- clear, +ETT Neck- supple  Lungs- normal work of breathing Heart- Regular rate and rhythm  GI- soft, NT, ND, + BS Extremities- no clubbing, cyanosis, or edema Skin- no rash or lesion Psych- euthymic mood, full affect Neuro- strength and  sensation are intact  LABS: Basic Metabolic Panel:  Recent Labs  06/14/16 0347 06/15/16 0520  NA 146* 150*  K 3.8 3.9  CL 99* 102  CO2 29 29  GLUCOSE 154* 152*  BUN 132* 160*  CREATININE 3.78* 3.73*  CALCIUM 9.9 9.9  MG 2.1 2.3  PHOS 6.0* 5.4*  5.3*   Liver Function Tests:  Recent Labs  06/14/16 0347 06/15/16 0520  ALBUMIN 2.9* 2.5*   CBC:  Recent Labs  06/14/16 0347 06/15/16 0520  WBC 18.5* 15.8*  HGB 14.4 13.8  HCT 43.7 41.9  MCV 89.2 89.3  PLT 230 235    ASSESSMENT AND PLAN:  Active Problems:   SYSTOLIC HEART FAILURE, CHRONIC   Automatic implantable cardioverter-defibrillator in situ   Diaphragm paralysis   Acute respiratory failure with hypoxia (HCC)   Pressure injury of skin   Acute respiratory failure with hypoxemia (HCC)   Bacteremia due to Streptococcus   Central line infection   Infected defibrillator (HCC)   History of ETT  1.  Positive blood cultures in the setting of CRTD Plan for TEE today to evaluate ICD leads  Dr Aundra Dubin to do at bedside at 12:30PM - echo lab notifed, orders entered  2.  Chronic systolic heart failure Appears euvolemic Management per HF team  3.  CAP/respiratory failure Would do TEE prior to extubation Management per primary team   Chanetta Marshall, NP 06/15/2016 10:25 AM

## 2016-06-15 NOTE — Progress Notes (Signed)
eLink Physician-Brief Progress Note Patient Name: [name] DOB: [birth year] MRN: [id number]   Date of Service  06/15/2016  HPI/Events of Note  rn requests full code order Full code entered Pt NOT DNR  eICU Interventions       Intervention Category Minor Interventions: Communication with other healthcare providers and/or family  Raylene Miyamoto. 06/15/2016, 9:54 PM

## 2016-06-15 NOTE — Progress Notes (Signed)
Nutrition Follow-up  DOCUMENTATION CODES:   Obesity unspecified  INTERVENTION:   When Cortrak placed, resume:   Vital High Protein at 60 ml/h (1440 ml per day)   Provides 1440 kcal, 126 gm protein, 1204 ml free water daily  NUTRITION DIAGNOSIS:   Inadequate oral intake related to inability to eat as evidenced by NPO status.  Ongoing  GOAL:   Provide needs based on ASPEN/SCCM guidelines  Met with TF  MONITOR:   Vent status, TF tolerance, Skin, Labs, I & O's  ASSESSMENT:   56 yo male w/ PMHx of CHF, CKD, CAD, ICD, and hypothyroidism who presented w/ SOB on 4/2. He was initially tx for CHF exacerbation in the ED and then worked up for sepsis after developing temp of 104.69F while in the ED. Required intubation.  Discussed patient with RN today. OG tube was clogged earlier today, so it was removed. Cortrak tube has been ordered. TF currently off. Patient remains intubated on ventilator support MV: 10.8 L/min Temp (24hrs), Avg:98.2 F (36.8 C), Min:97.6 F (36.4 C), Max:98.5 F (36.9 C)  Labs reviewed. Sodium 150 (H) CBG's: 171-142 Medications reviewed.  Diet Order:  Diet NPO time specified  Skin:  Wound (see comment) (stage II to back)  Last BM:  PTA  Height:   Ht Readings from Last 1 Encounters:  06/06/16 '5\' 6"'$  (1.676 m)    Weight:   Wt Readings from Last 1 Encounters:  06/15/16 216 lb 14.9 oz (98.4 kg)    Ideal Body Weight:  64.5 kg  BMI:  Body mass index is 35.01 kg/m.  Estimated Nutritional Needs:   Kcal:  4136-4383  Protein:  129 gm  Fluid:  2 L  EDUCATION NEEDS:   No education needs identified at this time  Molli Barrows, [place], [place], [place] Pager [phone] After Hours Pager [phone]

## 2016-06-15 NOTE — Progress Notes (Addendum)
PULMONARY  / CRITICAL CARE MEDICINE  Name: [name] MRN: [id number] DOB: Apr 28, 1960    LOS: 69  REFERRING MD :  Dr. Winfred Leeds   CHIEF COMPLAINT:  SOB   BRIEF PATIENT DESCRIPTION: 34M w/ PMHx of CHF, hemidiaphragm s/p paraganglioma resection,CKD, CAD s/p Biventricular ICD, and hypothyroidism who presents w/ SOB who was initially tx for CHF exacerbation in the ED and then worked up for sepsis after developing temp of 104.45F while in the ED.   LINES / TUBES: NG/OG tube 4/6>> Urethral cathether 4/10>> Rectal tube 4/10>>  Airway 4/6>>  Airway 4/6 >>  Right internal jugular central venous triple lumen 4/6 >>   CULTURES: Blood culture x2 4/2 >> viridans strep in 1 bottle  Blood cultures x2 4/5 >> no growth  Respiratory culture 4/9 >> pending   ANTIBIOTICS: Vancomycin 4/2 -4/3  Zosyn 4/2-4/5  Azithromycin 4/5-4/9  Rocephin 4/5 >>   SIGNIFICANT EVENTS:  Chest xray 4/2 - mild vascular congestion  Chest xray 4/5 - pulmonary vascular congestion  Renal ultrasound 4/6 - negative for hydronephrosis, 1.7 cm left kidney cyst  Echo 4/5 - EF 20%  Chest xray 4/9 - mild edema and patchy airspace disease, stable   LEVEL OF CARE:  Critical  PRIMARY SERVICE:  PCCM CONSULTANTS:  elecrophysiology, infectious disease, nephrology, heart failure  CODE STATUS: FULL  DIET:  Feeding tube vital high protein 60 ml/hr  DVT Px: heparin  GI Px: protonix   HISTORY OF PRESENT ILLNESS:   PAST MEDICAL HISTORY :  Past Medical History:  Diagnosis Date  . Arthritis    GOUT  . Automatic implantable cardioverter-defibrillator in situ   . Biventricular ICD (implantable cardiac defibrillator) [employer] ]    DOI 2008/ upgrade 2010/ Gen Change 2014  . Bladder tumor    PT HOSP AT [facility] 4/24 TO 4/26 2014 WITH UTI--AND FOUND TO HAVE BLADDER TUMOR-  . Cardiomyopathy    Idiopathic dilated;   . CHF (congestive heart failure) ([place])   . CKD (chronic kidney disease)    stage III baseline Crt 1.8-2.0  .  Coronary artery disease   . Gout 08/03/12   PT C/O OF RIGHT KNEE PAIN AND SWELLING -STATES GOUT FLARE UP - ONGOING SINCE APRIL - BUT SWELLING W/IN LAST WEEK  . Hilar mass    Noted CT 2010  . HTN (hypertension)    Dr. Caryl Comes cardiologist  . Hypothyroidism   . 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 Sleep apnea    CPAP  . Systolic CHF ([place])    EF 76%  . Ventricular tachycardia [facility])    s/p ICD   Past Surgical History:  Procedure Laterality Date  . BIV ICD GENERTAOR CHANGE OUT     DOI 2008/ upgrade 2010/ Gen Change 2014   . CARDIAC CATHETERIZATION     he was found to have normal coronary arteries but with a globally dilated and hypocontractile heart  . CARDIAC DEFIBRILLATOR PLACEMENT     DOI 2008/ upgrade 2010/ Gen Change 2014   . CHEST TUBE INSERTION  09/10/2013  . CHOLECYSTECTOMY    . cornea replacement    . ENDOBRONCHIAL ULTRASOUND Bilateral 10/01/2012   Procedure: ENDOBRONCHIAL ULTRASOUND;  Surgeon: Collene Gobble, MD;  Location: [facility] ENDOSCOPY;  Service: Cardiopulmonary;  Laterality: Bilateral;  . IMPLANTABLE CARDIOVERTER DEFIBRILLATOR (ICD) GENERATOR CHANGE N/A 05/02/2012   Procedure: ICD GENERATOR CHANGE;  Surgeon: Deboraha Sprang, MD;  Location: [facility] CATH LAB;  Service: Cardiovascular;  Laterality: N/A;  . MEDIASTINOSCOPY N/A 10/25/2012   Procedure: MEDIASTINOSCOPY;  Surgeon: [name], MD;  Location: [place];  Service: Thoracic;  Laterality: N/A;  . MEDIASTINOTOMY  09/10/2013   paratracheal mass    DR Darcey Nora  . MEDIASTINOTOMY CHAMBERLAIN MCNEIL Right 09/10/2013   Procedure: MEDIASTINOTOMY CHAMBERLAIN MCNEIL;  Surgeon: [name], MD;  Location: [place];  Service: Thoracic;  Laterality: Right;  . RESECTION OF MEDIASTINAL MASS Right 02/13/2014   Procedure: RESECTION OF MEDIASTINAL MASS;  Surgeon: [name], MD;  Location: [facility] OR;  Service: Thoracic;  Laterality: Right;  PATIENT NEEDS EPIDURAL CATHETER AND A SWAN GANZ  CATHETER (DR. CHRIS MOSER AWARE PER PVT)  . RIGHT HEART CATHETERIZATION N/A 01/27/2012   Procedure: RIGHT HEART CATH;  Surgeon: Jolaine Artist, MD;  Location: [facility] CATH LAB;  Service: Cardiovascular;  Laterality: N/A;  . TONSILLECTOMY    . TRANSURETHRAL RESECTION OF BLADDER TUMOR N/A 08/13/2012   Procedure: TRANSURETHRAL RESECTION OF BLADDER TUMOR (TURBT);  Surgeon: Claybon Jabs, MD;  Location: [facility] ORS;  Service: Urology;  Laterality: N/A;  MITOMYCIN C  . US ECHOCARDIOGRAPHY  03-17-2008, 07-03-2006   EF 15-20%, EF 15-20%  . VIDEO ASSISTED THORACOSCOPY (VATS)/THOROCOTOMY Right 02/13/2014   Procedure: VIDEO ASSISTED THORACOSCOPY (VATS)/THOROCOTOMY;  Surgeon: [name], MD;  Location: [facility] OR;  Service: Thoracic;  Laterality: Right;  PATIENT NEEDS EPIDURAL CATHETER (DR. CHRIS MOSER AWARE PER PVT)  . VIDEO BRONCHOSCOPY N/A 10/25/2012   Procedure: VIDEO BRONCHOSCOPY;  Surgeon: [name], MD;  Location: [facility] OR;  Service: Thoracic;  Laterality: N/A;   Prior to Admission medications   Medication Sig Start Date End Date Taking? Authorizing Provider  atorvastatin (LIPITOR) 40 MG tablet TAKE 1 TABLET BY MOUTH AT BEDTIME 09/03/15  Yes Shaune Pascal Bensimhon, MD  BREO ELLIPTA 100-25 MCG/INH AEPB Inhale 1 puff into the lungs daily as needed (for shortness of breath).  12/11/14  Yes Historical Provider, MD  carvedilol (COREG) 12.5 MG tablet Take 6.25 mg by mouth 2 (two) times daily with a meal.    Yes Historical Provider, MD  gabapentin (NEURONTIN) 300 MG capsule Take 300 mg by mouth 2 (two) times daily. Pain 06/25/15  Yes Historical Provider, MD  hydrALAZINE (APRESOLINE) 50 MG tablet Take 1 tablet (50 mg total) by mouth 2 (two) times daily. Patient taking differently: Take 25 mg by mouth 2 (two) times daily.  05/05/16  Yes Deboraha Sprang, MD  HYDROcodone-acetaminophen (NORCO/VICODIN) 5-325 MG tablet Take 0.5 tablets by mouth 2 (two) times daily as needed (for pain).    Yes Historical Provider, MD  magnesium  oxide (MAG-OX) 400 MG tablet Take two tablets (800 mg) by mouth twice daily 09/22/15  Yes Deboraha Sprang, MD  omeprazole (PRILOSEC) 20 MG capsule Take 20 mg by mouth at bedtime.   Yes Historical Provider, MD  tobramycin-dexamethasone [facility] ophthalmic solution Place 1 drop into the right eye 2 (two) times daily.   Yes Historical Provider, MD  torsemide (DEMADEX) 10 MG tablet Take 10 mg by mouth daily. Or as directed by your physician   Yes Historical Provider, MD  MITIGARE 0.6 MG CAPS Take 0.6 mg by mouth daily. Patient not taking: Reported on 06/07/2016 07/28/15   Larey Dresser, MD   Allergies  Allergen Reactions  . Bidil [Isosorb Dinitrate-Hydralazine] Other (See Comments)    Headaches   . Spironolactone Other (See Comments)    Hyperkalemia   . Hydralazine Hcl Other (See Comments)    "  Fuzzy headed" feeling    FAMILY HISTORY:  Family History  Problem Relation Age of Onset  . Family history unknown: Yes   SOCIAL HISTORY:  reports that he has never smoked. He has never used smokeless tobacco. He reports that he does not drink alcohol or use drugs.  REVIEW OF SYSTEMS: Denies pain    INTERVAL HISTORY:   VITAL SIGNS: Temp:  [98 F (36.7 C)-98.6 F (37 C)] 98.5 F (36.9 C) (04/11 0419) Pulse Rate:  [45-136] 91 (04/11 0630) Resp:  [18-33] 22 (04/11 0630) BP: (79-128)/(59-100) 103/69 (04/11 0630) SpO2:  [95 %-100 %] 100 % (04/11 0630) FiO2 (%):  [30 %] 30 % (04/11 0400) Weight:  [98.4 kg (216 lb 14.9 oz)] 98.4 kg (216 lb 14.9 oz) (04/11 0500) HEMODYNAMICS: CVP:  [1 mmHg-5 mmHg] 5 mmHg VENTILATOR SETTINGS: Vent Mode: PRVC FiO2 (%):  [30 %] 30 % Set Rate:  [22 bmp] 22 bmp Vt Set:  [510 mL] 510 mL PEEP:  [5 cmH20] 5 cmH20 Pressure Support:  [12 cmH20] 12 cmH20 Plateau Pressure:  [20 cmH20-22 cmH20] 20 cmH20 INTAKE / OUTPUT: Intake/Output      04/10 0701 - 04/11 0700 04/11 0701 - 04/12 0700   I.V. (mL/kg) 409.9 (4.2)    NG/GT 750    IV Piggyback     Total  Intake(mL/kg) 1159.9 (11.8)    Urine (mL/kg/hr) 1100 (0.5)    Stool 325 (0.1)    Total Output 1425     Net -265.1            PHYSICAL EXAMINATION: General:  Alert, NAD, well nourished  Neuro:  Alert, moving all extremities  HEENT: Intubated Cardiovascular: RRR, no murmur  Lungs:  Clear to auscultation bilateral  Abdomen: BS+, soft non- tender, non distended  Musculoskeletal:  Moving extremities  Skin: intact, warm and dry    LABS: Cbc  Recent Labs Lab 06/12/16 0350 06/13/16 0504 06/14/16 0347  WBC 15.7* 17.6* 18.5*  HGB 14.1 14.5 14.4  HCT 42.5 43.9 43.7  PLT 198 205 230    Chemistry   Recent Labs Lab 06/13/16 0504 06/14/16 0347 06/15/16 0520  NA 145 146* 150*  K 4.0 3.8 3.9  CL 100* 99* 102  CO2 28 29 29   BUN 112* 132* 160*  CREATININE 3.64* 3.78* 3.73*  CALCIUM 9.6 9.9 9.9  MG 1.9 2.1 2.3  PHOS 4.7*  4.8* 6.0* 5.4*  5.3*  GLUCOSE 142* 154* 152*    Liver fxn  Recent Labs Lab 06/13/16 0504 06/14/16 0347 06/15/16 0520  ALBUMIN 3.0* 2.9* 2.5*   coags No results for input(s): APTT, INR in the last 168 hours. Sepsis markers  Recent Labs Lab 06/13/16 0955 06/14/16 0347 06/15/16 0520  PROCALCITON 2.73 3.12 3.27   Cardiac markers No results for input(s): CKTOTAL, CKMB, TROPONINI in the last 168 hours. BNP No results for input(s): PROBNP in the last 168 hours. ABG No results for input(s): PHART, PCO2ART, PO2ART, HCO3, TCO2 in the last 168 hours.  CBG trend  Recent Labs Lab 06/14/16 1204 06/14/16 1554 06/14/16 2000 06/14/16 2356 06/15/16 0418  GLUCAP 183* 170* 171* 160* 134*    IMAGING:  ECG:  DIAGNOSES: Active Problems:   SYSTOLIC HEART FAILURE, CHRONIC   Automatic implantable cardioverter-defibrillator in situ   Diaphragm paralysis   Acute respiratory failure with hypoxia (HCC)   Pressure injury of skin   Acute respiratory failure with hypoxemia (HCC)   Bacteremia due to Streptococcus   Central line infection  Infected defibrillator ([place])   History of ETT   ASSESSMENT / PLAN:  PULMONARY  ASSESSMENT: Acute hypoxemic respiratory failure 2/2 pulmonary edema/ CHF + deconditioning ?Right middle lobe pneumonia on chest xray  Hx of paralyzed hemidiaphragm  Hx of paraganglioma  Cardiorenal syndrome  PLAN:   Continue vent support, spontaneous weaning trial today  Heart failure is following Follow up chest xray    CARDIOVASCULAR  ASSESSMENT:  Acute on chronic CHF exacerbation, EF 20%  Pulmonary edema Co-ox 10 yesterday, holding lasix, has had 2.7 L net output since admission and is down 13 kg. Hypernatremic today so will need free water.  Normotensive  PLAN:  Started Carvedilol 6.25 mg BID 4/10 Consider hydralazine and nitrates for cardiomyopathy if cardiology is in agreement  Decreased free water to 200cc q8 hours Ordered D5 at 100 cc/hr  Daily Coox  Holding Lasix 4/10  Follow up TEE  Heart failure and EP are following   RENAL  ASSESSMENT:   Hypernatremia, worsening will need more fluids  AKI, stable  PLAN:   Increasing Free water 200 ml q4h  D5 100 cc/hr  BMP q8h  GASTROINTESTINAL  ASSESSMENT:  No current issues  PLAN:   Continue high protein tube feeds   HEMATOLOGIC  ASSESSMENT:   No current issues  PLAN:  Monitor   INFECTIOUS  ASSESSMENT:  Tmax last 24 hours 98.4 On rocephin Blood cultures 4/2 grew strep viridans in one bottle but repeat 4/5 had no growth. She had a fever 4/8 but has been afebrile since. ID recommended TEE to evaluate ICD wires.  PLAN:   Continue rocephin  Discontinued Azithromycin 4/9 Follow up respiratory cultures 4/9 >> no growth to date  Follow up TEE   ENDOCRINE  ASSESSMENT:   Adding D5, anticipating hyperglycemia    PLAN:   Insulin sliding scale  D5 100 cc/hr  Fentanyl drip  Fentanyl 50 mg q1h PRN  Versed 1 mg q4h PRN  PT following    NEUROLOGIC  ASSESSMENT:   Anxiety  PLAN:   RasS goal: 0 - -2   CLINICAL SUMMARY:  Pt with fluid overload who has had good diuresis with 13 kg output since admission 4/2, he continues to have tachypnea with poor tidal volume. Has hypernatremia today, increasing free water and started D5 gtt. Had concern for sepsis early in hospital course, last fever was 4/8, he is on rocephin for CAP.   I have personally obtained a history, examined the patient, evaluated laboratory and imaging results, formulated the assessment and plan and placed orders. CRITICAL CARE: The patient is critically ill with multiple organ systems failure and requires high complexity decision making for assessment and support, frequent evaluation and titration of therapies, application of advanced monitoring technologies and extensive interpretation of multiple databases. Critical Care Time devoted to patient care services described in this note is 30 minutes.    Pulmonary and [place] Pager: [phone]  06/15/2016, 7:19 AM

## 2016-06-15 NOTE — CV Procedure (Signed)
Procedure: TEE  Sedation: Versed 8 mg IV, Fentanyl 200 mcg IV  Findings: Please see echo section for full report.  The left ventricle was mild to moderately dilated.  EF 15% with diffuse hypokinesis.  No LV thrombus noted.  The right ventricle was normal in size with mildly decreased systolic function.  Normal right atrial size, mild to moderately dilated left atrium.  No LA appendage thrombus.  No PFO or ASD by color doppler.  There was an ICD on the right side of the heart.  No vegetation noted.  Mild tricuspid regurgitation, peak RV-RA gradient 35 mmHg.  No TV vegetation.  Mild pulmonic insufficiency, no PV vegetation.  The posterior leaflet of the mitral valve was restricted and did not coapt completely with the anteriorly leaflet.  There was moderate to severe mitral regurgitation (severe visually, moderate by PISA => ERO 0.28 cm^2).  No mitral valve vegetation.  The aortic valve was trileaflet with no stenosis and trivial aortic insufficiency.  No aortic valve vegetation.  The aorta was normal in caliber with mild plaque in the descending thoracic aorta.   Impression: No endocarditis.   [name] 06/15/2016 2:03 PM

## 2016-06-15 NOTE — Progress Notes (Signed)
ogtube clogged , unable to flush or aspirate. dcd. Ccm made aware. Ordered cortrak

## 2016-06-16 ENCOUNTER — Inpatient Hospital Stay (HOSPITAL_COMMUNITY): Payer: 59

## 2016-06-16 DIAGNOSIS — M10262 Drug-induced gout, left knee: Secondary | ICD-10-CM

## 2016-06-16 DIAGNOSIS — M10261 Drug-induced gout, right knee: Secondary | ICD-10-CM

## 2016-06-16 LAB — RENAL FUNCTION PANEL
Albumin: 2.4 g/dL — ABNORMAL LOW (ref 3.5–5.0)
Anion gap: 13 (ref 5–15)
BUN: 152 mg/dL — ABNORMAL HIGH (ref 6–20)
CALCIUM: 9.2 mg/dL (ref 8.9–10.3)
CHLORIDE: 100 mmol/L — AB (ref 101–111)
CO2: 28 mmol/L (ref 22–32)
CREATININE: 3.42 mg/dL — AB (ref 0.61–1.24)
GFR calc non Af Amer: 18 mL/min — ABNORMAL LOW (ref >60)
GFR, EST AFRICAN AMERICAN: 21 mL/min — AB (ref >60)
Glucose, Bld: 143 mg/dL — ABNORMAL HIGH (ref 65–99)
Phosphorus: 5.9 mg/dL — ABNORMAL HIGH (ref 2.5–4.6)
Potassium: 3.7 mmol/L (ref 3.5–5.1)
SODIUM: 141 mmol/L (ref 135–145)

## 2016-06-16 LAB — CULTURE, BLOOD (ROUTINE X 2): Special Requests: ADEQUATE

## 2016-06-16 LAB — BASIC METABOLIC PANEL
Anion gap: 13 (ref 5–15)
BUN: 148 mg/dL — AB (ref 6–20)
CHLORIDE: 100 mmol/L — AB (ref 101–111)
CO2: 29 mmol/L (ref 22–32)
CREATININE: 3.03 mg/dL — AB (ref 0.61–1.24)
Calcium: 9.3 mg/dL (ref 8.9–10.3)
GFR calc non Af Amer: 21 mL/min — ABNORMAL LOW (ref >60)
GFR, EST AFRICAN AMERICAN: 25 mL/min — AB (ref >60)
GLUCOSE: 156 mg/dL — AB (ref 65–99)
Potassium: 4.3 mmol/L (ref 3.5–5.1)
Sodium: 142 mmol/L (ref 135–145)

## 2016-06-16 LAB — POCT I-STAT 3, ART BLOOD GAS (G3+)
ACID-BASE EXCESS: 4 mmol/L — AB (ref 0.0–2.0)
BICARBONATE: 28.2 mmol/L — AB (ref 20.0–28.0)
O2 SAT: 100 %
PO2 ART: 183 mmHg — AB (ref 83.0–108.0)
Patient temperature: 98.8
TCO2: 29 mmol/L (ref 0–100)
pCO2 arterial: 40.1 mmHg (ref 32.0–48.0)
pH, Arterial: 7.455 — ABNORMAL HIGH (ref 7.350–7.450)

## 2016-06-16 LAB — GLUCOSE, CAPILLARY
GLUCOSE-CAPILLARY: 138 mg/dL — AB (ref 65–99)
GLUCOSE-CAPILLARY: 143 mg/dL — AB (ref 65–99)
GLUCOSE-CAPILLARY: 145 mg/dL — AB (ref 65–99)
GLUCOSE-CAPILLARY: 159 mg/dL — AB (ref 65–99)
Glucose-Capillary: 145 mg/dL — ABNORMAL HIGH (ref 65–99)
Glucose-Capillary: 284 mg/dL — ABNORMAL HIGH (ref 65–99)

## 2016-06-16 LAB — CULTURE, RESPIRATORY: CULTURE: NORMAL

## 2016-06-16 LAB — CULTURE, RESPIRATORY W GRAM STAIN

## 2016-06-16 LAB — CBC
HCT: 38.6 % — ABNORMAL LOW (ref 39.0–52.0)
HEMOGLOBIN: 12.5 g/dL — AB (ref 13.0–17.0)
MCH: 29.1 pg (ref 26.0–34.0)
MCHC: 32.4 g/dL (ref 30.0–36.0)
MCV: 90 fL (ref 78.0–100.0)
PLATELETS: 241 10*3/uL (ref 150–400)
RBC: 4.29 MIL/uL (ref 4.22–5.81)
RDW: 14.7 % (ref 11.5–15.5)
WBC: 15 10*3/uL — AB (ref 4.0–10.5)

## 2016-06-16 LAB — URIC ACID: URIC ACID, SERUM: 10.3 mg/dL — AB (ref 4.4–7.6)

## 2016-06-16 LAB — CK: CK TOTAL: 112 U/L (ref 49–397)

## 2016-06-16 LAB — HCV COMMENT:

## 2016-06-16 LAB — COOXEMETRY PANEL
CARBOXYHEMOGLOBIN: 1.4 % (ref 0.5–1.5)
Methemoglobin: 0.6 % (ref 0.0–1.5)
O2 SAT: 63.9 %
Total hemoglobin: 11.8 g/dL — ABNORMAL LOW (ref 12.0–16.0)

## 2016-06-16 LAB — HEPATITIS C ANTIBODY (REFLEX): HCV Ab: 0.3 s/co ratio (ref 0.0–0.9)

## 2016-06-16 MED ORDER — PREDNISONE 20 MG PO TABS
30.0000 mg | ORAL_TABLET | Freq: Every day | ORAL | Status: DC
  Administered 2016-06-16 – 2016-06-18 (×3): 30 mg via ORAL
  Filled 2016-06-16 (×3): qty 1

## 2016-06-16 MED ORDER — COLCHICINE 0.6 MG PO TABS
0.6000 mg | ORAL_TABLET | Freq: Every day | ORAL | Status: DC
  Administered 2016-06-17 – 2016-06-24 (×8): 0.6 mg via ORAL
  Filled 2016-06-16 (×9): qty 1

## 2016-06-16 MED ORDER — FUROSEMIDE 10 MG/ML IJ SOLN
40.0000 mg | Freq: Once | INTRAMUSCULAR | Status: AC
  Administered 2016-06-16: 40 mg via INTRAVENOUS
  Filled 2016-06-16: qty 4

## 2016-06-16 MED ORDER — INSULIN ASPART 100 UNIT/ML ~~LOC~~ SOLN
0.0000 [IU] | SUBCUTANEOUS | Status: DC
  Administered 2016-06-16: 5 [IU] via SUBCUTANEOUS
  Administered 2016-06-16 – 2016-06-17 (×2): 1 [IU] via SUBCUTANEOUS
  Administered 2016-06-17 (×4): 2 [IU] via SUBCUTANEOUS
  Administered 2016-06-18: 1 [IU] via SUBCUTANEOUS
  Administered 2016-06-18: 3 [IU] via SUBCUTANEOUS
  Administered 2016-06-18: 2 [IU] via SUBCUTANEOUS
  Administered 2016-06-18: 3 [IU] via SUBCUTANEOUS
  Administered 2016-06-18: 1 [IU] via SUBCUTANEOUS
  Administered 2016-06-19 (×2): 2 [IU] via SUBCUTANEOUS
  Administered 2016-06-19 (×3): 1 [IU] via SUBCUTANEOUS
  Administered 2016-06-19: 2 [IU] via SUBCUTANEOUS
  Administered 2016-06-20 (×4): 1 [IU] via SUBCUTANEOUS

## 2016-06-16 MED ORDER — COLCHICINE 0.6 MG PO TABS
1.2000 mg | ORAL_TABLET | Freq: Once | ORAL | Status: AC
  Administered 2016-06-16: 1.2 mg via ORAL
  Filled 2016-06-16: qty 2

## 2016-06-16 NOTE — Progress Notes (Signed)
RT note- patient working with PT, placed back to full support.

## 2016-06-16 NOTE — Progress Notes (Signed)
Subjective: Intubated, but awake and alert and complaining about bilateral knee pain.   Antibiotics:  Anti-infectives    Start     Dose/Rate Route Frequency Ordered Stop   06/09/16 1700  cefTRIAXone (ROCEPHIN) 2 g in dextrose 5 % 50 mL IVPB     2 g 100 mL/hr over 30 Minutes Intravenous Every 24 hours 06/09/16 1039 06/22/16 2359   06/09/16 1200  azithromycin (ZITHROMAX) 500 mg in dextrose 5 % 250 mL IVPB     500 mg 250 mL/hr over 60 Minutes Intravenous Every 24 hours 06/09/16 1039 06/13/16 1346   06/07/16 1900  vancomycin (VANCOCIN) 1,250 mg in sodium chloride 0.9 % 250 mL IVPB  Status:  Discontinued     1,250 mg 166.7 mL/hr over 90 Minutes Intravenous Every 24 hours 06/06/16 1751 06/08/16 1208   06/07/16 0200  piperacillin-tazobactam (ZOSYN) IVPB 3.375 g  Status:  Discontinued     3.375 g 12.5 mL/hr over 240 Minutes Intravenous Every 8 hours 06/06/16 1749 06/09/16 1038   06/06/16 1830  piperacillin-tazobactam (ZOSYN) IVPB 3.375 g  Status:  Discontinued     3.375 g 100 mL/hr over 30 Minutes Intravenous  Once 06/06/16 1817 06/06/16 1826   06/06/16 1830  vancomycin (VANCOCIN) IVPB 1000 mg/200 mL premix  Status:  Discontinued     1,000 mg 200 mL/hr over 60 Minutes Intravenous  Once 06/06/16 1817 06/06/16 1826   06/06/16 1800  vancomycin (VANCOCIN) 2,000 mg in sodium chloride 0.9 % 500 mL IVPB     2,000 mg 250 mL/hr over 120 Minutes Intravenous  Once 06/06/16 1748 06/06/16 2023   06/06/16 1745  piperacillin-tazobactam (ZOSYN) IVPB 3.375 g     3.375 g 100 mL/hr over 30 Minutes Intravenous  Once 06/06/16 1744 06/06/16 1823   06/06/16 1745  vancomycin (VANCOCIN) IVPB 1000 mg/200 mL premix  Status:  Discontinued     1,000 mg 200 mL/hr over 60 Minutes Intravenous  Once 06/06/16 1744 06/06/16 1748      Medications: Scheduled Meds: . carvedilol  6.25 mg Oral BID WC  . cefTRIAXone (ROCEPHIN)  IV  2 g Intravenous Q24H  . chlorhexidine gluconate (MEDLINE KIT)  15 mL Mouth  Rinse BID  . Chlorhexidine Gluconate Cloth  6 each Topical Daily  . colchicine  0.6 mg Oral Daily  . feeding supplement (VITAL HIGH PROTEIN)  1,000 mL Per Tube Q24H  . heparin  5,000 Units Subcutaneous Q8H  . hydrALAZINE  10 mg Oral Q8H  . insulin aspart  2-6 Units Subcutaneous Q4H  . isosorbide dinitrate  5 mg Oral BID  . mouth rinse  15 mL Mouth Rinse QID  . pantoprazole sodium  40 mg Per Tube Daily  . pneumococcal 23 valent vaccine  0.5 mL Intramuscular Tomorrow-1000  . predniSONE  30 mg Oral Q breakfast  . sodium chloride flush  10-40 mL Intracatheter Q12H  . tobramycin-dexamethasone   Right Eye BID   Continuous Infusions: . sodium chloride 83.3 mL/hr at 06/14/16 2200  . sodium chloride 10 mL/hr at 06/10/16 1203  . fentaNYL infusion INTRAVENOUS Stopped (06/13/16 1745)   PRN Meds:.fentaNYL, midazolam, sodium chloride flush    Objective: Weight change: -1 lb 15.7 oz (-0.9 kg)  Intake/Output Summary (Last 24 hours) at 06/16/16 1808 Last data filed at 06/16/16 1700  Gross per 24 hour  Intake          2908.33 ml  Output  1150 ml  Net          1758.33 ml   Blood pressure 106/71, pulse 88, temperature 97.5 F (36.4 C), temperature source Oral, resp. rate (!) 30, height 5\' 6"  (1.676 m), weight 214 lb 15.2 oz (97.5 kg), SpO2 97 %. Temp:  [97.4 F (36.3 C)-98.8 F (37.1 C)] 97.5 F (36.4 C) (04/12 1554) Pulse Rate:  [78-92] 88 (04/12 1700) Resp:  [21-57] 30 (04/12 1700) BP: (81-111)/(57-83) 106/71 (04/12 1700) SpO2:  [94 %-100 %] 97 % (04/12 1700) FiO2 (%):  [30 %-40 %] 30 % (04/12 1621) Weight:  [214 lb 15.2 oz (97.5 kg)] 214 lb 15.2 oz (97.5 kg) (04/12 0426)  Physical Exam: General: Sleepy after TEE on the ventilator HEENT: anicteric sclera, pupils reactive to light and accommodation intubated CVS regular rate, normal r,  no murmur rubs or gallops Chest:  no wheezing, rales or rhonchi Abdomen: soft nontender, nondistended, normal bowel sounds, Extremities:  He has bilateral effusions and tenderness worse on the left versus the right which he believes is due to gout. Skin: no rashes  Neuro: nonfocal  CBC:  CBC Latest Ref Rng & Units 06/16/2016 06/15/2016 06/14/2016  WBC 4.0 - 10.5 K/uL 15.0(H) 15.8(H) 18.5(H)  Hemoglobin 13.0 - 17.0 g/dL 12.5(L) 13.8 14.4  Hematocrit 39.0 - 52.0 % 38.6(L) 41.9 43.7  Platelets 150 - 400 K/uL 241 235 230     BMET  Recent Labs  06/15/16 1950 06/16/16 0433  NA 143 141  K 3.7 3.7  CL 100* 100*  CO2 28 28  GLUCOSE 209* 143*  BUN 153* 152*  CREATININE 3.61* 3.42*  CALCIUM 9.5 9.2     Liver Panel   Recent Labs  06/15/16 0520 06/16/16 0433  ALBUMIN 2.5* 2.4*       Sedimentation Rate No results for input(s): ESRSEDRATE in the last 72 hours. C-Reactive Protein No results for input(s): CRP in the last 72 hours.  Micro Results: Recent Results (from the past 720 hour(s))  Culture, blood (x 2)     Status: Abnormal   Collection Time: 06/06/16  5:50 PM  Result Value Ref Range Status   Specimen Description BLOOD RIGHT ANTECUBITAL  Final   Special Requests IN PEDIATRIC BOTTLE Blood Culture adequate volume  Final   Culture  Setup Time   Final    GRAM POSITIVE COCCI IN CHAINS IN PAIRS IN PEDIATRIC BOTTLE CRITICAL RESULT CALLED TO, READ BACK BY AND VERIFIED WITH: CARON AMEND,PHARMD @0008  06/08/16 MKELLY,MLT    Culture VIRIDANS STREPTOCOCCUS (A)  Final   Report Status 06/16/2016 FINAL  Final   Organism ID, Bacteria VIRIDANS STREPTOCOCCUS  Final      Susceptibility   Viridans streptococcus - MIC*    ERYTHROMYCIN 2 RESISTANT Resistant     TETRACYCLINE 0.5 SENSITIVE Sensitive     VANCOMYCIN 0.25 SENSITIVE Sensitive     CLINDAMYCIN <=0.25 SENSITIVE Sensitive     PENICILLIN 0.12 SENSITIVE Sensitive     CEFTRIAXONE <=0.12 SENSITIVE Sensitive     * VIRIDANS STREPTOCOCCUS  Blood Culture (routine x 2)     Status: None   Collection Time: 06/06/16  6:21 PM  Result Value Ref Range Status   Specimen  Description BLOOD RIGHT HAND  Final   Special Requests   Final    BOTTLES DRAWN AEROBIC ONLY Blood Culture adequate volume   Culture NO GROWTH 5 DAYS  Final   Report Status 06/11/2016 FINAL  Final  MRSA PCR Screening     Status: None  Collection Time: 06/06/16  8:44 PM  Result Value Ref Range Status   MRSA by PCR NEGATIVE NEGATIVE Final    Comment:        The GeneXpert MRSA Assay (FDA approved for NASAL specimens only), is one component of a comprehensive MRSA colonization surveillance program. It is not intended to diagnose MRSA infection nor to guide or monitor treatment for MRSA infections.   Culture, blood (routine x 2)     Status: None   Collection Time: 06/09/16 10:25 AM  Result Value Ref Range Status   Specimen Description BLOOD RIGHT HAND  Final   Special Requests IN PEDIATRIC BOTTLE Blood Culture adequate volume  Final   Culture NO GROWTH 5 DAYS  Final   Report Status 06/14/2016 FINAL  Final  Culture, blood (routine x 2)     Status: None   Collection Time: 06/09/16 10:31 AM  Result Value Ref Range Status   Specimen Description BLOOD RIGHT THUMB  Final   Special Requests IN PEDIATRIC BOTTLE Blood Culture adequate volume  Final   Culture NO GROWTH 5 DAYS  Final   Report Status 06/14/2016 FINAL  Final  Culture, respiratory (NON-Expectorated)     Status: None   Collection Time: 06/13/16  8:35 PM  Result Value Ref Range Status   Specimen Description TRACHEAL ASPIRATE  Final   Special Requests NONE  Final   Gram Stain   Final    FEW WBC PRESENT,BOTH PMN AND MONONUCLEAR NO ORGANISMS SEEN    Culture Consistent with normal respiratory flora.  Final   Report Status 06/16/2016 FINAL  Final    Studies/Results: Dg Chest Port 1 View  Result Date: 06/16/2016 CLINICAL DATA:  Intubation, on ventilator EXAM: PORTABLE CHEST 1 VIEW COMPARISON:  Portable chest x-ray of 06/15/2016 FINDINGS: The tip of the endotracheal tube is approximately 5.1 cm above the carina. Cardiomegaly  is stable and there still mild pulmonary vascular congestion present. No pleural effusion is seen. AICD leads remain. NG tube extends below the hemidiaphragm. Right central venous line tip overlies the lower SVC. IMPRESSION: 1. Tip of endotracheal tube approximately 5.1 cm above the carina. 2. Cardiomegaly and mild pulmonary vascular congestion remain. Electronically Signed   By: Ivar Drape M.D.   On: 06/16/2016 11:14   Dg Chest Port 1 View  Result Date: 06/15/2016 CLINICAL DATA:  56 year old male status post endotracheal tube placement on ventilator. EXAM: PORTABLE CHEST 1 VIEW COMPARISON:  Chest x-ray 06/13/2016. FINDINGS: An endotracheal tube is in place with tip 4.1 cm above the carina. There is a right-sided internal jugular central venous catheter with tip terminating in the superior cavoatrial junction. Left-sided pacemaker/AICD with lead tips projecting over the expected location of the right atrium and right ventricular apex. Low lung volumes. Improved aeration throughout the lungs bilaterally with resolution of much of the previously noted multifocal interstitial and airspace disease, presumably reflective of resolving edema and/or multifocal pneumonia. Trace right pleural effusion. No left pleural effusion. Cephalization of the pulmonary vasculature, crowding of the pulmonary vasculature, likely accentuated by the low lung volumes. Heart size remains mildly enlarged. Upper mediastinal contours are within normal limits. Multiple surgical clips project over the thoracic inlet and the medial upper right hemithorax. Multiple old healed right-sided rib fractures. IMPRESSION: 1. Support apparatus, as above. 2. Overall, there is significant improvement compared to the examination from 2 days ago with near complete resolution of edema and/or multilobar pneumonia seen on the prior study. 3. Mild cardiomegaly. 4. Trace right pleural effusion. Electronically  Signed   By: Vinnie Langton M.D.   On: 06/15/2016  13:41   Dg Abd Portable 1v  Result Date: 06/15/2016 CLINICAL DATA:  Feeding tube placement. EXAM: PORTABLE ABDOMEN - 1 VIEW COMPARISON:  Supine abdominal radiograph of June 09, 2016 FINDINGS: The radiodense feeding tube has replaced the esophagogastric tube. The tip of the feeding tube lies in the region of the third portion of the duodenum. IMPRESSION: The feeding tube tip now lies in the region of the third portion of the duodenum. Electronically Signed   By: David  Martinique M.D.   On: 06/15/2016 17:15      Assessment/Plan:  INTERVAL HISTORY:  TEE without vegetations on leads or valves Seemingly having a gout flare.  Active Problems:   SYSTOLIC HEART FAILURE, CHRONIC   Automatic implantable cardioverter-defibrillator in situ   Diaphragm paralysis   Acute respiratory failure with hypoxia (HCC)   Pressure injury of skin   Acute respiratory failure with hypoxemia (HCC)   Bacteremia due to Streptococcus   Central line infection   Infected defibrillator [facility])   History of ETT    ACELIN FERDIG is a 56 y.o. male with virdans streptococcal bacteremia and sepsis and concern for ICD infection. TEE was negative today  #1 Viridans group streptococcal bacteremia and sepsis: Viridans group strep was susceptible to ceftriaxone and penicillin -- give him 2 weeks of effective antibiotics (currently with Ceftriaxone 2 grams daily_ from date of first negative cultures = 06/09/16 (note 3 lumen placed AFTER blood cultures cleared)  #2 gout: Agree with current therapy if it does not respond to steroids  need to reconsider obviously that he might have septic joints that need to be aspirated and sent for cell count differential crystals and culture   IV abx plan  Diagnosis: Rare dance group streptococcal bacteremia in the presence of a ICD  Culture Result: Viridans group streptococcal bacteremia  Allergies  Allergen Reactions  . Bidil [Isosorb Dinitrate-Hydralazine] Other (See Comments)     Headaches   . Spironolactone Other (See Comments)    Hyperkalemia   . Hydralazine Hcl Other (See Comments)    "Fuzzy headed" feeling    Discharge antibiotics:Ceftriaxone 2 g daily  Duration: 2 weeks End Date:  06/22/16  Central line, [facility] Care Per Protocol:  Labs weekly while on IV antibiotics: _x_ CBC with differential _x_ BMP   _x Please pull PIC at completion of IV antibiotics __ Please leave PIC in place until doctor has seen patient or been notified  Fax weekly labs to [phone]  Clinic Follow Up Appt:  Call ID if need [email or web address].  I would recommend just having surveillance blood cultures checked 14+ days after finishing his IV ceftriaxone.  I will sign off for now please call with further questions.    LOS: 10 days   Alcide Evener 06/16/2016, 6:08 PM

## 2016-06-16 NOTE — Progress Notes (Signed)
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 8 Hypernatremia, improved 9 Bacteremia, strep  P CCM now managing fluids. Cardiology on board  We will sign off.  Subjective: Interval History: Pos fluid balance  Objective: Vital signs in last 24 hours: Temp:  [97.4 F (36.3 C)-98 F (36.7 C)] 97.9 F (36.6 C) (04/12 0801) Pulse Rate:  [60-94] 92 (04/12 1100) Resp:  [21-57] 27 (04/12 1100) BP: (78-121)/(57-97) 105/67 (04/12 1100) SpO2:  [96 %-100 %] 99 % (04/12 1100) FiO2 (%):  [30 %-100 %] 40 % (04/12 0935) Weight:  [97.5 kg (214 lb 15.2 oz)] 97.5 kg (214 lb 15.2 oz) (04/12 0426) Weight change: -0.9 kg (-1 lb 15.7 oz)  Intake/Output from previous day: 04/11 0701 - 04/12 0700 In: 3468.3 [I.V.:2368.3; NG/GT:1080] Out: 1125 [Urine:1100; Stool:25] Intake/Output this shift: Total I/O In: 480 [I.V.:40; NG/GT:440] Out: -   General appearance: alert and cooperative Resp: clear to auscultation bilaterally Cardio: regular rate and rhythm, S1, S2 normal, no murmur, click, rub or gallop Extremities: No CCE  Lab Results:  Recent Labs  06/15/16 0520 06/16/16 0433  WBC 15.8* 15.0*  HGB 13.8 12.5*  HCT 41.9 38.6*  PLT 235 241   BMET:  Recent Labs  06/15/16 1950 06/16/16 0433  NA 143 141  K 3.7 3.7  CL 100* 100*  CO2 28 28  GLUCOSE 209* 143*  BUN 153* 152*  CREATININE 3.61* 3.42*  CALCIUM 9.5 9.2   No results for input(s): PTH in the last 72 hours. Iron Studies: No results for input(s): IRON, TIBC, TRANSFERRIN, FERRITIN in the last 72 hours. Studies/Results: Dg Chest Port 1 View  Result Date: 06/16/2016 CLINICAL DATA:  Intubation, on ventilator EXAM: PORTABLE CHEST 1 VIEW COMPARISON:  Portable chest x-ray of 06/15/2016 FINDINGS: The tip of the endotracheal tube is approximately 5.1 cm above the carina. Cardiomegaly is stable and there still mild pulmonary  vascular congestion present. No pleural effusion is seen. AICD leads remain. NG tube extends below the hemidiaphragm. Right central venous line tip overlies the lower SVC. IMPRESSION: 1. Tip of endotracheal tube approximately 5.1 cm above the carina. 2. Cardiomegaly and mild pulmonary vascular congestion remain. Electronically Signed   By: Ivar Drape M.D.   On: 06/16/2016 11:14   Dg Chest Port 1 View  Result Date: 06/15/2016 CLINICAL DATA:  56 year old male status post endotracheal tube placement on ventilator. EXAM: PORTABLE CHEST 1 VIEW COMPARISON:  Chest x-ray 06/13/2016. FINDINGS: An endotracheal tube is in place with tip 4.1 cm above the carina. There is a right-sided internal jugular central venous catheter with tip terminating in the superior cavoatrial junction. Left-sided pacemaker/AICD with lead tips projecting over the expected location of the right atrium and right ventricular apex. Low lung volumes. Improved aeration throughout the lungs bilaterally with resolution of much of the previously noted multifocal interstitial and airspace disease, presumably reflective of resolving edema and/or multifocal pneumonia. Trace right pleural effusion. No left pleural effusion. Cephalization of the pulmonary vasculature, crowding of the pulmonary vasculature, likely accentuated by the low lung volumes. Heart size remains mildly enlarged. Upper mediastinal contours are within normal limits. Multiple surgical clips project over the thoracic inlet and the medial upper right hemithorax. Multiple old healed right-sided rib fractures. IMPRESSION: 1. Support apparatus, as above. 2. Overall, there is significant improvement compared to the examination from 2 days ago with near complete resolution of  edema and/or multilobar pneumonia seen on the prior study. 3. Mild cardiomegaly. 4. Trace right pleural effusion. Electronically Signed   By: Vinnie Langton M.D.   On: 06/15/2016 13:41   Dg Abd Portable 1v  Result  Date: 06/15/2016 CLINICAL DATA:  Feeding tube placement. EXAM: PORTABLE ABDOMEN - 1 VIEW COMPARISON:  Supine abdominal radiograph of June 09, 2016 FINDINGS: The radiodense feeding tube has replaced the esophagogastric tube. The tip of the feeding tube lies in the region of the third portion of the duodenum. IMPRESSION: The feeding tube tip now lies in the region of the third portion of the duodenum. Electronically Signed   By: David  Martinique M.D.   On: 06/15/2016 17:15    Scheduled: . carvedilol  6.25 mg Oral BID WC  . cefTRIAXone (ROCEPHIN)  IV  2 g Intravenous Q24H  . chlorhexidine gluconate (MEDLINE KIT)  15 mL Mouth Rinse BID  . Chlorhexidine Gluconate Cloth  6 each Topical Daily  . feeding supplement (VITAL HIGH PROTEIN)  1,000 mL Per Tube Q24H  . heparin  5,000 Units Subcutaneous Q8H  . hydrALAZINE  10 mg Oral Q8H  . insulin aspart  2-6 Units Subcutaneous Q4H  . isosorbide dinitrate  5 mg Oral BID  . mouth rinse  15 mL Mouth Rinse QID  . pantoprazole sodium  40 mg Per Tube Daily  . pneumococcal 23 valent vaccine  0.5 mL Intramuscular Tomorrow-1000  . sodium chloride flush  10-40 mL Intracatheter Q12H  . tobramycin-dexamethasone   Right Eye BID     LOS: 10 days   Tamber Burtch C 06/16/2016,11:34 AM

## 2016-06-16 NOTE — Care Management Note (Addendum)
Case Management Note Previous Addendum by Carles Collet   Patient Details  Name: [name] MRN: [id number] Date of Birth: [birth year]  Subjective/Objective:   Pt admitted with SOB                 Action/Plan: Reportedly from home.   Pt intubated/extubated and then unfortunately reintubated.  CM will continue to follow for discharge needs  Addendum 4/10 Pt OOBTC w PT. rec CIR/ LTAC at this time. CM will continue to follow PT rec as patient progresses. Patient remains ventilated through ETT, failed wean yesterday. 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    Expected Discharge Date:                  Expected Discharge Plan:     In-House Referral:  Clinical Social Work  Discharge planning Services     Post Acute Care Choice:    Choice offered to:     DME Arranged:    DME Agency:     HH Arranged:    [place] Agency:     Status of Service:  In process, will continue to follow  If discussed at Long Length of Stay Meetings, dates discussed:    Additional Comments: 06/16/2016 Pt is from home with wife.  Remains intubated.  Attending to discuss pt with HF team as prognosis is poor Maryclare Labrador, RN 06/16/2016, 12:03 PM

## 2016-06-16 NOTE — Progress Notes (Signed)
Advanced Heart Failure Rounding Note   Subjective:    Awake on vent, HR sinus in the 90s. Co ox stable at 62%. He is weaning this morning on the vent.   Creatinine 3.42 down from 3.79 yesterday am.  BUN remains markedly elevated 152.   Intubated. Alert and follows commands. Seated in chair. Denies lightheadedness or dizziness.   + 2.3 L, though weight shows down 1 lb.   Na 141. Continues to receive free water boluses.  Studies TEE 06/15/16 with LVEF 15%, No thrombus noted, RV mildly decreased in function, mild/mod LAE, moderate to severe MR with some restriction of posterior leaflet.  No endocarditis noted.  Objective:   Weight Range:  Vital Signs:   Temp:  [97.4 F (36.3 C)-98 F (36.7 C)] 97.9 F (36.6 C) (04/12 0801) Pulse Rate:  [60-96] 87 (04/12 0800) Resp:  [21-57] 33 (04/12 0800) BP: (78-121)/(62-97) 106/72 (04/12 0800) SpO2:  [97 %-100 %] 99 % (04/12 0800) FiO2 (%):  [30 %-100 %] 40 % (04/12 0900) Weight:  [214 lb 15.2 oz (97.5 kg)] 214 lb 15.2 oz (97.5 kg) (04/12 0426) Last BM Date: 06/15/16  Weight change: Filed Weights   06/14/16 0417 06/15/16 0500 06/16/16 0426  Weight: 213 lb 13.5 oz (97 kg) 216 lb 14.9 oz (98.4 kg) 214 lb 15.2 oz (97.5 kg)    Intake/Output:   Intake/Output Summary (Last 24 hours) at 06/16/16 0933 Last data filed at 06/16/16 0800  Gross per 24 hour  Intake          3398.33 ml  Output             1125 ml  Net          2273.33 ml     Physical Exam: General: Intubated AA male in NAD. Awake on vent.   HEENT: R eye opaque, + ETT.   Neck: Supple. LIJ central line. No JVP appreciated.     Cor: PMI laterally displaced, RRR.  1/6 HSM apex.  Lungs: Scattered rhonchi in upper lobes.  Abdomen: Obese, soft, NT, ND, no HSM. No bruits or masses. +BS.  Extremities: No cyanosis, clubbing, rash, or edema.  Neuro: Alert & oriented x 3. Cranial nerves grossly intact. Moves all 4 extremities w/o difficulty. Affect flat but appropriate.    Telemetry: Reviewed personally, NSR in 90-100s     Labs: Basic Metabolic Panel:  Recent Labs Lab 06/11/16 0435 06/12/16 0350 06/13/16 0504 06/14/16 0347 06/15/16 0520 06/15/16 1300 06/15/16 1950 06/16/16 0433  NA 145 145 145 146* 150* 149* 143 141  K 3.8 3.9 4.0 3.8 3.9 4.1 3.7 3.7  CL 103 101 100* 99* 102 101 100* 100*  CO2 '28 29 28 29 29 29 28 28  '$ GLUCOSE 116* 117* 142* 154* 152* 133* 209* 143*  BUN 79* 90* 112* 132* 160* 160* 153* 152*  CREATININE 3.49* 3.29* 3.64* 3.78* 3.73* 3.79* 3.61* 3.42*  CALCIUM 9.1 9.5 9.6 9.9 9.9 10.0 9.5 9.2  MG 2.2 2.0 1.9 2.1 2.3  --   --   --   PHOS 3.1 4.1  4.2 4.7*  4.8* 6.0* 5.4*  5.3*  --   --  5.9*    Liver Function Tests:  Recent Labs Lab 06/12/16 0350 06/13/16 0504 06/14/16 0347 06/15/16 0520 06/16/16 0433  ALBUMIN 2.7* 3.0* 2.9* 2.5* 2.4*    CBC:  Recent Labs Lab 06/09/16 1025  06/12/16 0350 06/13/16 0504 06/14/16 0347 06/15/16 0520 06/16/16 0433  WBC  --   < >  15.7* 17.6* 18.5* 15.8* 15.0*  NEUTROABS 10.2*  --   --   --   --   --   --   HGB  --   < > 14.1 14.5 14.4 13.8 12.5*  HCT  --   < > 42.5 43.9 43.7 41.9 38.6*  MCV  --   < > 89.3 89.4 89.2 89.3 90.0  PLT  --   < > 198 205 230 235 241  < > = values in this interval not displayed.   BNP: BNP (last 3 results)  Recent Labs  06/06/16 1645 06/06/16 1925  BNP 796.0* 553.0*      Transthoracic Echocardiography 06/10/16 Study Conclusions  - Left ventricle: The cavity size was severely dilated. Wall   thickness was normal. The estimated ejection fraction was 20%.   Diffuse hypokinesis. - Aortic valve: There was mild regurgitation. - Mitral valve: There was moderate regurgitation. - Left atrium: The atrium was moderately dilated. - Atrial septum: No defect or patent foramen ovale was identified.      Medications:     Scheduled Medications: . carvedilol  6.25 mg Oral BID WC  . cefTRIAXone (ROCEPHIN)  IV  2 g Intravenous Q24H  .  chlorhexidine gluconate (MEDLINE KIT)  15 mL Mouth Rinse BID  . Chlorhexidine Gluconate Cloth  6 each Topical Daily  . feeding supplement (VITAL HIGH PROTEIN)  1,000 mL Per Tube Q24H  . free water  200 mL Per Tube Q4H  . heparin  5,000 Units Subcutaneous Q8H  . hydrALAZINE  10 mg Oral Q8H  . insulin aspart  2-6 Units Subcutaneous Q4H  . isosorbide dinitrate  5 mg Oral BID  . mouth rinse  15 mL Mouth Rinse QID  . pantoprazole sodium  40 mg Per Tube Daily  . pneumococcal 23 valent vaccine  0.5 mL Intramuscular Tomorrow-1000  . sodium chloride flush  10-40 mL Intracatheter Q12H  . tobramycin-dexamethasone   Right Eye BID    Infusions: . sodium chloride 83.3 mL/hr at 06/14/16 2200  . sodium chloride 10 mL/hr at 06/10/16 1203  . fentaNYL infusion INTRAVENOUS Stopped (06/13/16 1745)    PRN Medications: fentaNYL, midazolam, sodium chloride flush   Assessment/Plan   1. Septic shock: treated for CAP.  - Remains on ceftriaxone for bronchitis/PNA.  - Coox stable at 63.9% indicating adequate cardiac output.  - Strep viridans 1/2 on initial cultures. Follow up cultures negative.  - As above, TEE negative for endocarditis or infected ICD.  2. Acute hypoxic respiratory failure - Remains on vent. Per CCM.  3. Acute on chronic renal failure - Creatinine slightly improved, but BUN remains markedly elevated at 3.42. 4. Chronic systolic HF EF 95% on echo 06/09/16 - TEE 06/15/16 with no appreciated endocarditis. EF 15% - CVP 3-4 on my personal check at bedside. Continue to hold diuretics.  - Continue coreg 6.25 mg BID - Continue hydralazine 10 mg TID - Continue Isordil 5 mg BID ( able to be crushed for cortrak)  - Pressures soft this am so will not uptitrate.  5. Sinus tachycardia - Meds as above. Rate somewhat better today. Hope this will continue to improve as he does.  6. Paralyzed hemidiaphragm  - CCM administering spontaneous breathing trials to work towards weaning vent/extubation.  7.  h/o paraganglioma resection  Length of Stay: [address]  Annamaria Helling  06/16/2016, 9:33 AM Advanced Heart Failure Team  Pager [phone] M-F 7am-4pm.  Please contact [facility] for night-coverage after hours (4p -7a )  and weekends on [email or web address]  Patient seen with NP, agree with the above note.  BUN/creatinine still high but trending down.  CVP 3-4 this morning, up to 8-9 when I checked this evening.  Mild pulmonary vascular congestion only on CXR. Would Good co-ox suggests adequate cardiac output. Still unable to liberate from vent.  He has a paralyzed right hemidiaphragm and ?PNA.  I do not think that pulmonary edema plays much of a role.  Discussed with Dr. Lake Bells, can try a dose of Lasix 40 mg IV x 1 given some rise in CVP but do not think that volume overload plays a major role in his vent dependency.   For cardiomyopathy, can continue Coreg and low dose hydralazine/nitrates.   TEE yesterday with no endocarditis.   Loralie Champagne 06/16/2016 4:54 PM

## 2016-06-16 NOTE — Progress Notes (Signed)
[facility] Attending:  Admitted: 06/06/2016  4:29 PM  Admitted with presumed CHF exacerbation, but developed fever;  One blood culture positive for strep viridans, repeats negative TEE yesterday: no vegetation seen ID following  Failed extubation on 4/10  On exam Vitals:   06/16/16 0631 06/16/16 0700 06/16/16 0800 06/16/16 0801  BP: 102/74 106/71 106/72   Pulse:  87 87   Resp:  (!) 46 (!) 33   Temp:    97.9 F (36.6 C)  TempSrc:    Oral  SpO2:  100% 99%   Weight:      Height:       Vent Mode: PSV;CPAP FiO2 (%):  [30 %-100 %] 40 % Set Rate:  [22 bmp] 22 bmp Vt Set:  [510 mL] 510 mL PEEP:  [5 cmH20] 5 cmH20 Pressure Support:  [12 cmH20-14 cmH20] 12 cmH20 Plateau Pressure:  [20 cmH20-21 cmH20] 21 cmH20   Intake/Output Summary (Last 24 hours) at 06/16/16 1030 Last data filed at 06/16/16 0900  Gross per 24 hour  Intake          3298.33 ml  Output              725 ml  Net          2573.33 ml     General:  Resting comfortably in chair HENT: NCAT ETT in place PULM: few crackles bases, mostly clear CV: RRR, no mgr GI: BS+, soft, nontender MSK: normal bulk and tone Neuro: awake, alert, no distress, MAEW    BMET    Component Value Date/Time   NA 141 06/16/2016 0433   K 3.7 06/16/2016 0433   CL 100 (L) 06/16/2016 0433   CO2 28 06/16/2016 0433   GLUCOSE 143 (H) 06/16/2016 0433   BUN 152 (H) 06/16/2016 0433   CREATININE 3.42 (H) 06/16/2016 0433   CREATININE 1.71 (H) 03/10/2015 0859   CALCIUM 9.2 06/16/2016 0433   GFRNONAA 18 (L) 06/16/2016 0433   GFRAA 21 (L) 06/16/2016 0433   CBC    Component Value Date/Time   WBC 15.0 (H) 06/16/2016 0433   RBC 4.29 06/16/2016 0433   HGB 12.5 (L) 06/16/2016 0433   HCT 38.6 (L) 06/16/2016 0433   PLT 241 06/16/2016 0433   MCV 90.0 06/16/2016 0433   MCV 84.1 09/19/2014 1103   MCH 29.1 06/16/2016 0433   MCHC 32.4 06/16/2016 0433   RDW 14.7 06/16/2016 0433   LYMPHSABS 1.4 06/09/2016 1025   MONOABS 1.5 (H) 06/09/2016 1025   EOSABS 0.2 06/09/2016 1025   BASOSABS 0.0 06/09/2016 1025   CXR images from 4/11 independently reviewed: improved air space disease  Impression/Plan:  Acute respiratory failure with hypoxemia: failed SBT yesterday; agree with repeat CXR to see if he has pulmonary edema; limited by hemidiaphragm paralysis; check ABG today Systolic heart failure: will discuss with heart failure, likely need Strep viridans bacteremia> continue antibiotics per ID AKI> hoping to avoid hemodialysis  My cc time 31 minutes  Roselie Awkward, MD [place] Pager: [phone] Cell: [phone] After 3pm or if no response, call [phone]

## 2016-06-16 NOTE — Progress Notes (Signed)
Physical Therapy Treatment Patient Details Name: [name] MRN: [id number] DOB: [birth year] Today's Date: 06/16/2016    History of Present Illness 51M w/ PMHx of CHF, gout, cardiomyopathy, hemidiaphragm s/p paraganglioma resection, CKD, CAD, ICD, and hypothyroidism who presents w/ SOB who was initially tx for CHF exacerbation in the ED and then worked up for sepsis. Intubated 4/2    PT Comments    Pt very motivated to participate with therapy. RT put pt on full vent support for PT session. RN present in room for transfer to manage lines.   Follow Up Recommendations  CIR;Supervision/Assistance - 24 hour;LTACH     Equipment Recommendations  Rolling walker with 5" wheels;3in1 (PT)    Recommendations for Other Services       Precautions / Restrictions Precautions Precautions: Fall;Other (comment) Precaution Comments: vent, ETT, panda, rectal pouch Restrictions Weight Bearing Restrictions: No    Mobility  Bed Mobility         Supine to sit: +2 for physical assistance;Mod assist     General bed mobility comments: cues for sequencing. Use of bed pad to scoot to EOB.  Transfers   Equipment used: None Transfers: Sit to/from Stand Sit to Stand: Total assist;+2 physical assistance         General transfer comment: Pt unable to attain full upright stance. L knee buckling and unable to support him for pivot transfer. Sling placed with pt sitting EOB and maxisky utilized for bed to recliner transfer.   Ambulation/Gait             General Gait Details: unable at this time   Stairs            Wheelchair Mobility    Modified Rankin (Stroke Patients Only)       Balance   Sitting-balance support: Bilateral upper extremity supported;Feet supported Sitting balance-Leahy Scale: Poor Sitting balance - Comments: min to mod assist to maintain EOB sitting. Pt tolerated EOB x 10-15 min. Postural control: Right lateral lean                                   Cognition Arousal/Alertness: Awake/alert Behavior During Therapy: WFL for tasks assessed/performed Overall Cognitive Status: Difficult to assess                                        Exercises      General Comments        Pertinent Vitals/Pain Pain Assessment: Faces Faces Pain Scale: Hurts whole lot Pain Location: bilat knees due to gout Pain Descriptors / Indicators: Sore;Tender;Grimacing;Guarding Pain Intervention(s): Monitored during session;Repositioned    Home Living                      Prior Function            PT Goals (current goals can now be found in the care plan section) Acute Rehab PT Goals Patient Stated Goal: to go home PT Goal Formulation: With patient Time For Goal Achievement: 06/23/16 Potential to Achieve Goals: Good Progress towards PT goals: Progressing toward goals    Frequency    Min 3X/week      PT Plan Current plan remains appropriate    Co-evaluation             End of Session Equipment Utilized During  Treatment: Gait belt;Oxygen Activity Tolerance: Patient limited by pain;Patient limited by fatigue Patient left: in chair;with chair alarm set;with call bell/phone within reach;with nursing/sitter in room Nurse Communication: Mobility status;Need for lift equipment PT Visit Diagnosis: Unsteadiness on feet (R26.81);Muscle weakness (generalized) (M62.81);Difficulty in walking, not elsewhere classified (R26.2)     Time: 1003-4961 PT Time Calculation (min) (ACUTE ONLY): 39 min  Charges:  $Therapeutic Activity: 38-52 mins                    G Codes:       [name], PT  Office # [phone] Pager [phone]    [name] 06/16/2016, 9:49 AM

## 2016-06-16 NOTE — Progress Notes (Signed)
Pt is back on FS at this time tolerating it well. Family is at the bedside.

## 2016-06-16 NOTE — Progress Notes (Signed)
PULMONARY  / CRITICAL CARE MEDICINE  Name: [name] MRN: [id number] DOB: 11-Oct-1960    LOS: 62  REFERRING MD :  Dr. Winfred Leeds   CHIEF COMPLAINT:  SOB   BRIEF PATIENT DESCRIPTION: 12M w/ PMHx of CHF, hemidiaphragm s/p paraganglioma resection,CKD, CAD s/p Biventricular ICD, and hypothyroidism who presents w/ SOB who was initially tx for CHF exacerbation in the ED and then worked up for sepsis after developing temp of 104.32F while in the ED.   LINES / TUBES: NG/OG tube 4/6>> Urethral cathether 4/10>> Rectal tube 4/10>>  Airway 4/6>>  Airway 4/6 >>  Right internal jugular central venous triple lumen 4/6 >>   CULTURES: Blood culture x2 4/2 >> viridans strep in 1 bottle  Blood cultures x2 4/5 >> no growth  Respiratory culture 4/9 >> pending   ANTIBIOTICS: Vancomycin 4/2 -4/3  Zosyn 4/2-4/5  Azithromycin 4/5-4/9  Rocephin 4/5 >>   SIGNIFICANT EVENTS:  Chest xray 4/2 - mild vascular congestion  Chest xray 4/5 - pulmonary vascular congestion  Renal ultrasound 4/6 - negative for hydronephrosis, 1.7 cm left kidney cyst  Echo 4/5 - EF 20%  Chest xray 4/9 - mild edema and patchy airspace disease, stable  TEE 4/10 mod-severe mitral regurgitation, no vegetations   LEVEL OF CARE:  Critical  PRIMARY SERVICE:  PCCM CONSULTANTS:  elecrophysiology, infectious disease, nephrology, heart failure  CODE STATUS: FULL  DIET:  Feeding tube vital high protein 60 ml/hr  DVT Px: heparin  GI Px: protonix   HISTORY OF PRESENT ILLNESS:   PAST MEDICAL HISTORY :  Past Medical History:  Diagnosis Date  . Arthritis    GOUT  . Automatic implantable cardioverter-defibrillator in situ   . Biventricular ICD (implantable cardiac defibrillator) [employer] ]    DOI 2008/ upgrade 2010/ Gen Change 2014  . Bladder tumor    PT HOSP AT [facility] 4/24 TO 4/26 2014 WITH UTI--AND FOUND TO HAVE BLADDER TUMOR-  . Cardiomyopathy    Idiopathic dilated;   . CHF (congestive heart failure) ([place])   . CKD  (chronic kidney disease)    stage III baseline Crt 1.8-2.0  . Coronary artery disease   . Gout 08/03/12   PT C/O OF RIGHT KNEE PAIN AND SWELLING -STATES GOUT FLARE UP - ONGOING SINCE APRIL - BUT SWELLING W/IN LAST WEEK  . Hilar mass    Noted CT 2010  . HTN (hypertension)    Dr. Caryl Comes cardiologist  . Hypothyroidism   . 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 Sleep apnea    CPAP  . Systolic CHF ([place])    EF 31%  . Ventricular tachycardia [facility])    s/p ICD   Past Surgical History:  Procedure Laterality Date  . BIV ICD GENERTAOR CHANGE OUT     DOI 2008/ upgrade 2010/ Gen Change 2014   . CARDIAC CATHETERIZATION     he was found to have normal coronary arteries but with a globally dilated and hypocontractile heart  . CARDIAC DEFIBRILLATOR PLACEMENT     DOI 2008/ upgrade 2010/ Gen Change 2014   . CHEST TUBE INSERTION  09/10/2013  . CHOLECYSTECTOMY    . cornea replacement    . ENDOBRONCHIAL ULTRASOUND Bilateral 10/01/2012   Procedure: ENDOBRONCHIAL ULTRASOUND;  Surgeon: Collene Gobble, MD;  Location: [facility] ENDOSCOPY;  Service: Cardiopulmonary;  Laterality: Bilateral;  . IMPLANTABLE CARDIOVERTER DEFIBRILLATOR (ICD) GENERATOR CHANGE N/A 05/02/2012   Procedure: ICD GENERATOR CHANGE;  Surgeon: Revonda Standard  Caryl Comes, MD;  Location: [facility] CATH LAB;  Service: Cardiovascular;  Laterality: N/A;  . MEDIASTINOSCOPY N/A 10/25/2012   Procedure: MEDIASTINOSCOPY;  Surgeon: [name], MD;  Location: [place];  Service: Thoracic;  Laterality: N/A;  . MEDIASTINOTOMY  09/10/2013   paratracheal mass    DR Darcey Nora  . MEDIASTINOTOMY CHAMBERLAIN MCNEIL Right 09/10/2013   Procedure: MEDIASTINOTOMY CHAMBERLAIN MCNEIL;  Surgeon: [name], MD;  Location: [place];  Service: Thoracic;  Laterality: Right;  . RESECTION OF MEDIASTINAL MASS Right 02/13/2014   Procedure: RESECTION OF MEDIASTINAL MASS;  Surgeon: [name], MD;  Location[facility] OR;  Service: Thoracic;   Laterality: Right;  PATIENT NEEDS EPIDURAL CATHETER AND A SWAN GANZ CATHETER (DR. CHRIS MOSER AWARE PER PVT)  . RIGHT HEART CATHETERIZATION N/A 01/27/2012   Procedure: RIGHT HEART CATH;  Surgeon: Jolaine Artist, MD;  Location: [facility] CATH LAB;  Service: Cardiovascular;  Laterality: N/A;  . TONSILLECTOMY    . TRANSURETHRAL RESECTION OF BLADDER TUMOR N/A 08/13/2012   Procedure: TRANSURETHRAL RESECTION OF BLADDER TUMOR (TURBT);  Surgeon: Claybon Jabs, MD;  Location: [facility] ORS;  Service: Urology;  Laterality: N/A;  MITOMYCIN C  . US ECHOCARDIOGRAPHY  03-17-2008, 07-03-2006   EF 15-20%, EF 15-20%  . VIDEO ASSISTED THORACOSCOPY (VATS)/THOROCOTOMY Right 02/13/2014   Procedure: VIDEO ASSISTED THORACOSCOPY (VATS)/THOROCOTOMY;  Surgeon: [name], MD;  Location: [facility] OR;  Service: Thoracic;  Laterality: Right;  PATIENT NEEDS EPIDURAL CATHETER (DR. CHRIS MOSER AWARE PER PVT)  . VIDEO BRONCHOSCOPY N/A 10/25/2012   Procedure: VIDEO BRONCHOSCOPY;  Surgeon: [name], MD;  Location: [facility] OR;  Service: Thoracic;  Laterality: N/A;   Prior to Admission medications   Medication Sig Start Date End Date Taking? Authorizing Provider  atorvastatin (LIPITOR) 40 MG tablet TAKE 1 TABLET BY MOUTH AT BEDTIME 09/03/15  Yes Shaune Pascal Bensimhon, MD  BREO ELLIPTA 100-25 MCG/INH AEPB Inhale 1 puff into the lungs daily as needed (for shortness of breath).  12/11/14  Yes Historical Provider, MD  carvedilol (COREG) 12.5 MG tablet Take 6.25 mg by mouth 2 (two) times daily with a meal.    Yes Historical Provider, MD  gabapentin (NEURONTIN) 300 MG capsule Take 300 mg by mouth 2 (two) times daily. Pain 06/25/15  Yes Historical Provider, MD  hydrALAZINE (APRESOLINE) 50 MG tablet Take 1 tablet (50 mg total) by mouth 2 (two) times daily. Patient taking differently: Take 25 mg by mouth 2 (two) times daily.  05/05/16  Yes Deboraha Sprang, MD  HYDROcodone-acetaminophen (NORCO/VICODIN) 5-325 MG tablet Take 0.5 tablets by mouth 2 (two) times daily  as needed (for pain).    Yes Historical Provider, MD  magnesium oxide (MAG-OX) 400 MG tablet Take two tablets (800 mg) by mouth twice daily 09/22/15  Yes Deboraha Sprang, MD  omeprazole (PRILOSEC) 20 MG capsule Take 20 mg by mouth at bedtime.   Yes Historical Provider, MD  tobramycin-dexamethasone [facility]) ophthalmic solution Place 1 drop into the right eye 2 (two) times daily.   Yes Historical Provider, MD  torsemide (DEMADEX) 10 MG tablet Take 10 mg by mouth daily. Or as directed by your physician   Yes Historical Provider, MD  MITIGARE 0.6 MG CAPS Take 0.6 mg by mouth daily. Patient not taking: Reported on 06/07/2016 07/28/15   Larey Dresser, MD   Allergies  Allergen Reactions  . Bidil [Isosorb Dinitrate-Hydralazine] Other (See Comments)    Headaches   . Spironolactone Other (See Comments)    Hyperkalemia   .  Hydralazine Hcl Other (See Comments)    "Fuzzy headed" feeling    FAMILY HISTORY:  Family History  Problem Relation Age of Onset  . Family history unknown: Yes   SOCIAL HISTORY:  reports that he has never smoked. He has never used smokeless tobacco. He reports that he does not drink alcohol or use drugs.  REVIEW OF SYSTEMS: Denies pain    INTERVAL HISTORY:   VITAL SIGNS: Temp:  [97.4 F (36.3 C)-98 F (36.7 C)] 98 F (36.7 C) (04/12 0340) Pulse Rate:  [48-96] 78 (04/12 0400) Resp:  [21-57] 22 (04/12 0400) BP: (78-121)/(62-97) 99/83 (04/12 0400) SpO2:  [95 %-100 %] 100 % (04/12 0400) FiO2 (%):  [30 %-100 %] 40 % (04/12 0400) Weight:  [97.5 kg (214 lb 15.2 oz)] 97.5 kg (214 lb 15.2 oz) (04/12 0426) HEMODYNAMICS: CVP:  [6 mmHg-11 mmHg] 11 mmHg VENTILATOR SETTINGS: Vent Mode: PRVC FiO2 (%):  [30 %-100 %] 40 % Set Rate:  [22 bmp] 22 bmp Vt Set:  [510 mL] 510 mL PEEP:  [5 cmH20] 5 cmH20 Pressure Support:  [14 cmH20] 14 cmH20 Plateau Pressure:  [20 cmH20-21 cmH20] 21 cmH20 INTAKE / OUTPUT: Intake/Output      04/11 0701 - 04/12 0700   I.V. (mL/kg) 2188.3 (22.4)    Other 20   NG/GT 900   Total Intake(mL/kg) 3108.3 (31.9)   Urine (mL/kg/hr) 875 (0.4)   Total Output 875   Net +2233.3       Urine Occurrence 1 x     PHYSICAL EXAMINATION: General:  Alert, NAD, well nourished  Neuro:  Alert, shakes his head and writes down answers to questions, moving all extremities  HEENT: Intubated, MMM  Cardiovascular: RRR, no murmur, no calf tenderness, no peripheral edema, SCDS in place  Lungs:  Clear to auscultation bilateral  Abdomen: BS+, soft non- tender, non distended  Musculoskeletal:  Moving extremities Skin: intact, warm and dry    LABS: Cbc  Recent Labs Lab 06/14/16 0347 06/15/16 0520 06/16/16 0433  WBC 18.5* 15.8* 15.0*  HGB 14.4 13.8 12.5*  HCT 43.7 41.9 38.6*  PLT 230 235 241    Chemistry   Recent Labs Lab 06/13/16 0504 06/14/16 0347 06/15/16 0520 06/15/16 1300 06/15/16 1950 06/16/16 0433  NA 145 146* 150* 149* 143 141  K 4.0 3.8 3.9 4.1 3.7 3.7  CL 100* 99* 102 101 100* 100*  CO2 28 29 29 29 28 28   BUN 112* 132* 160* 160* 153* 152*  CREATININE 3.64* 3.78* 3.73* 3.79* 3.61* 3.42*  CALCIUM 9.6 9.9 9.9 10.0 9.5 9.2  MG 1.9 2.1 2.3  --   --   --   PHOS 4.7*  4.8* 6.0* 5.4*  5.3*  --   --  5.9*  GLUCOSE 142* 154* 152* 133* 209* 143*    Liver fxn  Recent Labs Lab 06/14/16 0347 06/15/16 0520 06/16/16 0433  ALBUMIN 2.9* 2.5* 2.4*   coags No results for input(s): APTT, INR in the last 168 hours. Sepsis markers  Recent Labs Lab 06/13/16 0955 06/14/16 0347 06/15/16 0520  PROCALCITON 2.73 3.12 3.27   Cardiac markers No results for input(s): CKTOTAL, CKMB, TROPONINI in the last 168 hours. BNP No results for input(s): PROBNP in the last 168 hours. ABG No results for input(s): PHART, PCO2ART, PO2ART, HCO3, TCO2 in the last 168 hours.  CBG trend  Recent Labs Lab 06/15/16 1153 06/15/16 1603 06/15/16 1953 06/15/16 2354 06/16/16 0339  GLUCAP 142* 111* 206* 95 143*  IMAGING:  ECG:  DIAGNOSES: Active Problems:   SYSTOLIC HEART FAILURE, CHRONIC   Automatic implantable cardioverter-defibrillator in situ   Diaphragm paralysis   Acute respiratory failure with hypoxia (HCC)   Pressure injury of skin   Acute respiratory failure with hypoxemia (HCC)   Bacteremia due to Streptococcus   Central line infection   Infected defibrillator ([place])   History of ETT   ASSESSMENT / PLAN:  PULMONARY  ASSESSMENT: Acute hypoxemic respiratory failure 2/2 pulmonary edema/ CHF + deconditioning. Weaned to pressure support yesterday but was switched back to volume control for TEE.   Tachypnea improving, chest xray yesterday revealed significant improvement with near complete resolution of pulmonary edema and/or pneumonia.  Trace right pleural effusion  Hx of paralyzed hemidiaphragm  Hx of paraganglioma  Cardiorenal syndrome  PLAN:   Continue vent support, spontaneous weaning trial today  Heart failure is following Follow up chest xray   On rocephin    CARDIOVASCULAR  ASSESSMENT:  Acute on chronic CHF exacerbation, EF 15%  Pulmonary edema, improving  Holding lasix, is down 14 kg since admission. Low normal BP  TEE 4/11- EF 15%, moderate- severe mitral regurgitation  PLAN:  Carvedilol 6.25 mg BID 4/10 Started hydralazine and nitrates for cardiomyopathy  Free water to 200cc q8 hours Discontinued D5 50 cc/hr  Daily Coox  Heart failure and EP are following   RENAL  ASSESSMENT:   Hypernatremia, resolved  AKI, stable  PLAN:   Free water 200 ml q4h  Discontinued D5 50 cc/hr  BMP q12h  GASTROINTESTINAL  ASSESSMENT:  No current issues  PLAN:   Continue high protein tube feeds   HEMATOLOGIC  ASSESSMENT:   No current issues  PLAN:  Monitor   INFECTIOUS  ASSESSMENT:  Leukocytosis improving  Blood cultures 4/2 grew strep viridans in one bottle but repeat 4/5 had no growth, ID recommends 2 week course of rocephin, he is on day 8. He had  a fever 4/8 but has been afebrile since. TEE negative for vegetations.  PLAN:   Continue rocephin day 8/14 Follow up respiratory cultures 4/9 >> no growth to date   ENDOCRINE  ASSESSMENT:   Hyperglycemia  PLAN:   Insulin sliding scale  D5 50 cc/hr   NEUROLOGIC  ASSESSMENT:   Anxiety  PLAN:   RasS goal: 0 - -2 Fentanyl drip  Fentanyl 50 mg q1h PRN  Versed 1 mg q4h PRN  PT following   CLINICAL SUMMARY: Pt with fluid overload who has had good diuresis with 14 kg output since admission 4/2, he continues to have tachypnea with poor tidal volume but this is improving. Had concern for sepsis early in hospital course, last fever was 4/8, he is on rocephin for CAP.  Describes pain similar to gout flair today, will order steroids and colchicine.   I have personally obtained a history, examined the patient, evaluated laboratory and imaging results, formulated the assessment and plan and placed orders. CRITICAL CARE: The patient is critically ill with multiple organ systems failure and requires high complexity decision making for assessment and support, frequent evaluation and titration of therapies, application of advanced monitoring technologies and extensive interpretation of multiple databases. Critical Care Time devoted to patient care services described in this note is 30 minutes.    Pulmonary and [place] Pager: [phone]  06/16/2016, 5:46 AM

## 2016-06-17 ENCOUNTER — Inpatient Hospital Stay (HOSPITAL_COMMUNITY): Payer: 59

## 2016-06-17 LAB — GLUCOSE, CAPILLARY
GLUCOSE-CAPILLARY: 149 mg/dL — AB (ref 65–99)
GLUCOSE-CAPILLARY: 174 mg/dL — AB (ref 65–99)
GLUCOSE-CAPILLARY: 93 mg/dL (ref 65–99)
Glucose-Capillary: 164 mg/dL — ABNORMAL HIGH (ref 65–99)
Glucose-Capillary: 145 mg/dL — ABNORMAL HIGH (ref 65–99)
Glucose-Capillary: 184 mg/dL — ABNORMAL HIGH (ref 65–99)

## 2016-06-17 LAB — BASIC METABOLIC PANEL
Anion gap: 15 (ref 5–15)
Anion gap: 17 — ABNORMAL HIGH (ref 5–15)
BUN: 154 mg/dL — AB (ref 6–20)
BUN: 153 mg/dL — ABNORMAL HIGH (ref 6–20)
CALCIUM: 9.6 mg/dL (ref 8.9–10.3)
CHLORIDE: 98 mmol/L — AB (ref 101–111)
CHLORIDE: 100 mmol/L — AB (ref 101–111)
CO2: 28 mmol/L (ref 22–32)
CO2: 29 mmol/L (ref 22–32)
CREATININE: 3.01 mg/dL — AB (ref 0.61–1.24)
Calcium: 10.2 mg/dL (ref 8.9–10.3)
Creatinine, Ser: 2.92 mg/dL — ABNORMAL HIGH (ref 0.61–1.24)
GFR calc non Af Amer: 22 mL/min — ABNORMAL LOW (ref >60)
GFR calc non Af Amer: 22 mL/min — ABNORMAL LOW (ref >60)
GFR, EST AFRICAN AMERICAN: 25 mL/min — AB (ref >60)
GFR, EST AFRICAN AMERICAN: 26 mL/min — AB (ref >60)
GLUCOSE: 158 mg/dL — AB (ref 65–99)
Glucose, Bld: 180 mg/dL — ABNORMAL HIGH (ref 65–99)
Potassium: 4.3 mmol/L (ref 3.5–5.1)
Potassium: 4.7 mmol/L (ref 3.5–5.1)
SODIUM: 141 mmol/L (ref 135–145)
Sodium: 146 mmol/L — ABNORMAL HIGH (ref 135–145)

## 2016-06-17 LAB — RENAL FUNCTION PANEL
ALBUMIN: 2.4 g/dL — AB (ref 3.5–5.0)
ANION GAP: 14 (ref 5–15)
BUN: 151 mg/dL — AB (ref 6–20)
CALCIUM: 9.7 mg/dL (ref 8.9–10.3)
CO2: 28 mmol/L (ref 22–32)
Chloride: 100 mmol/L — ABNORMAL LOW (ref 101–111)
Creatinine, Ser: 3.02 mg/dL — ABNORMAL HIGH (ref 0.61–1.24)
GFR calc Af Amer: 25 mL/min — ABNORMAL LOW (ref >60)
GFR calc non Af Amer: 21 mL/min — ABNORMAL LOW (ref >60)
GLUCOSE: 181 mg/dL — AB (ref 65–99)
PHOSPHORUS: 5.2 mg/dL — AB (ref 2.5–4.6)
POTASSIUM: 4.3 mmol/L (ref 3.5–5.1)
SODIUM: 142 mmol/L (ref 135–145)

## 2016-06-17 LAB — CBC
HEMATOCRIT: 36.3 % — AB (ref 39.0–52.0)
HEMOGLOBIN: 11.8 g/dL — AB (ref 13.0–17.0)
MCH: 29.1 pg (ref 26.0–34.0)
MCHC: 32.5 g/dL (ref 30.0–36.0)
MCV: 89.4 fL (ref 78.0–100.0)
Platelets: 243 10*3/uL (ref 150–400)
RBC: 4.06 MIL/uL — AB (ref 4.22–5.81)
RDW: 14 % (ref 11.5–15.5)
WBC: 14.9 10*3/uL — ABNORMAL HIGH (ref 4.0–10.5)

## 2016-06-17 LAB — COOXEMETRY PANEL
CARBOXYHEMOGLOBIN: 1 % (ref 0.5–1.5)
Methemoglobin: 0.9 % (ref 0.0–1.5)
O2 Saturation: 66.4 %
Total hemoglobin: 10.9 g/dL — ABNORMAL LOW (ref 12.0–16.0)

## 2016-06-17 MED ORDER — FENTANYL CITRATE (PF) 100 MCG/2ML IJ SOLN
INTRAMUSCULAR | Status: AC
  Administered 2016-06-17: 50 ug via INTRAVENOUS
  Filled 2016-06-17: qty 2

## 2016-06-17 MED ORDER — FUROSEMIDE 10 MG/ML IJ SOLN
40.0000 mg | Freq: Four times a day (QID) | INTRAMUSCULAR | Status: AC
  Administered 2016-06-17 (×2): 40 mg via INTRAVENOUS
  Filled 2016-06-17 (×2): qty 4

## 2016-06-17 MED ORDER — FENTANYL CITRATE (PF) 100 MCG/2ML IJ SOLN
50.0000 ug | INTRAMUSCULAR | Status: DC | PRN
  Administered 2016-06-17 – 2016-06-18 (×5): 50 ug via INTRAVENOUS
  Filled 2016-06-17 (×4): qty 2

## 2016-06-17 NOTE — Progress Notes (Addendum)
PULMONARY  / CRITICAL CARE MEDICINE  Name: [name] MRN: [id number] DOB: 09-01-1960    LOS: 8  REFERRING MD :  Dr. Winfred Leeds   CHIEF COMPLAINT:  SOB   BRIEF PATIENT DESCRIPTION: 38M w/ PMHx of CHF, hemidiaphragm s/p paraganglioma resection,CKD, CAD s/p Biventricular ICD, and hypothyroidism who presented w/ SOB and diaphoresis who was initially tx for CHF exacerbation in the ED. He was placed on bipap, gived IV lasix and IV nitro but had tachypnea and diaphoresis which prompted intubation. He developing temp of 104.73F while in the ED, code sepsis was called. Blood cultures grew strep viridans in 1 of 2 bottles.   LINES / TUBES: NG/OG tube 4/6>> Urethral cathether 4/10>> Rectal tube 4/10>>  Airway 4/2>> Right internal jugular central venous triple lumen 4/6 >>   CULTURES: Blood culture x2 4/2 >> viridans strep in 1 bottle  Blood cultures x2 4/5 - no growth  Respiratory culture 4/9 - no growth   ANTIBIOTICS: Vancomycin 4/2 -4/3  Zosyn 4/2-4/5  Azithromycin 4/5-4/9  Rocephin 4/5 >>   SIGNIFICANT EVENTS:  Chest xray 4/2 - mild vascular congestion  Chest xray 4/5 - pulmonary vascular congestion  Renal ultrasound 4/6 - negative for hydronephrosis, 1.7 cm left kidney cyst  Echo 4/5 - EF 20%  Chest xray 4/9 - mild edema and patchy airspace disease, stable  TEE 4/10 mod-severe mitral regurgitation, no vegetations   LEVEL OF CARE:  Critical  PRIMARY SERVICE:  PCCM CONSULTANTS:  elecrophysiology, infectious disease, nephrology, heart failure  CODE STATUS: FULL  DIET:  Feeding tube vital high protein 60 ml/hr  DVT Px: heparin  GI Px: protonix   HISTORY OF PRESENT ILLNESS:   PAST MEDICAL HISTORY :  Past Medical History:  Diagnosis Date  . Arthritis    GOUT  . Automatic implantable cardioverter-defibrillator in situ   . Biventricular ICD (implantable cardiac defibrillator) [employer] ]    DOI 2008/ upgrade 2010/ Gen Change 2014  . Bladder tumor    PT HOSP AT [facility]  4/24 TO 4/26 2014 WITH UTI--AND FOUND TO HAVE BLADDER TUMOR-  . Cardiomyopathy    Idiopathic dilated;   . CHF (congestive heart failure) ([place])   . CKD (chronic kidney disease)    stage III baseline Crt 1.8-2.0  . Coronary artery disease   . Gout 08/03/12   PT C/O OF RIGHT KNEE PAIN AND SWELLING -STATES GOUT FLARE UP - ONGOING SINCE APRIL - BUT SWELLING W/IN LAST WEEK  . Hilar mass    Noted CT 2010  . HTN (hypertension)    Dr. Caryl Comes cardiologist  . Hypothyroidism   . 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 Sleep apnea    CPAP  . Systolic CHF ([place])    EF 27%  . Ventricular tachycardia [facility])    s/p ICD   Past Surgical History:  Procedure Laterality Date  . BIV ICD GENERTAOR CHANGE OUT     DOI 2008/ upgrade 2010/ Gen Change 2014   . CARDIAC CATHETERIZATION     he was found to have normal coronary arteries but with a globally dilated and hypocontractile heart  . CARDIAC DEFIBRILLATOR PLACEMENT     DOI 2008/ upgrade 2010/ Gen Change 2014   . CHEST TUBE INSERTION  09/10/2013  . CHOLECYSTECTOMY    . cornea replacement    . ENDOBRONCHIAL ULTRASOUND Bilateral 10/01/2012   Procedure: ENDOBRONCHIAL ULTRASOUND;  Surgeon: Collene Gobble, MD;  Location: [facility];  Service: Cardiopulmonary;  Laterality: Bilateral;  . IMPLANTABLE CARDIOVERTER DEFIBRILLATOR (ICD) GENERATOR CHANGE N/A 05/02/2012   Procedure: ICD GENERATOR CHANGE;  Surgeon: Deboraha Sprang, MD;  Location: [facility] CATH LAB;  Service: Cardiovascular;  Laterality: N/A;  . MEDIASTINOSCOPY N/A 10/25/2012   Procedure: MEDIASTINOSCOPY;  Surgeon: [name], MD;  Location: [place];  Service: Thoracic;  Laterality: N/A;  . MEDIASTINOTOMY  09/10/2013   paratracheal mass    DR Darcey Nora  . MEDIASTINOTOMY CHAMBERLAIN MCNEIL Right 09/10/2013   Procedure: MEDIASTINOTOMY CHAMBERLAIN MCNEIL;  Surgeon: [name], MD;  Location: [place];  Service: Thoracic;  Laterality: Right;  . RESECTION OF  MEDIASTINAL MASS Right 02/13/2014   Procedure: RESECTION OF MEDIASTINAL MASS;  Surgeon: [name], MD;  Location: [facility] OR;  Service: Thoracic;  Laterality: Right;  PATIENT NEEDS EPIDURAL CATHETER AND A SWAN GANZ CATHETER (DR. CHRIS MOSER AWARE PER PVT)  . RIGHT HEART CATHETERIZATION N/A 01/27/2012   Procedure: RIGHT HEART CATH;  Surgeon: Jolaine Artist, MD;  Location: [facility] CATH LAB;  Service: Cardiovascular;  Laterality: N/A;  . TONSILLECTOMY    . TRANSURETHRAL RESECTION OF BLADDER TUMOR N/A 08/13/2012   Procedure: TRANSURETHRAL RESECTION OF BLADDER TUMOR (TURBT);  Surgeon: Claybon Jabs, MD;  Location: [facility] ORS;  Service: Urology;  Laterality: N/A;  MITOMYCIN C  . US ECHOCARDIOGRAPHY  03-17-2008, 07-03-2006   EF 15-20%, EF 15-20%  . VIDEO ASSISTED THORACOSCOPY (VATS)/THOROCOTOMY Right 02/13/2014   Procedure: VIDEO ASSISTED THORACOSCOPY (VATS)/THOROCOTOMY;  Surgeon: [name], MD;  Location: [facility] OR;  Service: Thoracic;  Laterality: Right;  PATIENT NEEDS EPIDURAL CATHETER (DR. CHRIS MOSER AWARE PER PVT)  . VIDEO BRONCHOSCOPY N/A 10/25/2012   Procedure: VIDEO BRONCHOSCOPY;  Surgeon: [name], MD;  Location: [facility] OR;  Service: Thoracic;  Laterality: N/A;   Prior to Admission medications   Medication Sig Start Date End Date Taking? Authorizing Provider  atorvastatin (LIPITOR) 40 MG tablet TAKE 1 TABLET BY MOUTH AT BEDTIME 09/03/15  Yes Shaune Pascal Bensimhon, MD  BREO ELLIPTA 100-25 MCG/INH AEPB Inhale 1 puff into the lungs daily as needed (for shortness of breath).  12/11/14  Yes Historical Provider, MD  carvedilol (COREG) 12.5 MG tablet Take 6.25 mg by mouth 2 (two) times daily with a meal.    Yes Historical Provider, MD  gabapentin (NEURONTIN) 300 MG capsule Take 300 mg by mouth 2 (two) times daily. Pain 06/25/15  Yes Historical Provider, MD  hydrALAZINE (APRESOLINE) 50 MG tablet Take 1 tablet (50 mg total) by mouth 2 (two) times daily. Patient taking differently: Take 25 mg by mouth 2 (two)  times daily.  05/05/16  Yes Deboraha Sprang, MD  HYDROcodone-acetaminophen (NORCO/VICODIN) 5-325 MG tablet Take 0.5 tablets by mouth 2 (two) times daily as needed (for pain).    Yes Historical Provider, MD  magnesium oxide (MAG-OX) 400 MG tablet Take two tablets (800 mg) by mouth twice daily 09/22/15  Yes Deboraha Sprang, MD  omeprazole (PRILOSEC) 20 MG capsule Take 20 mg by mouth at bedtime.   Yes Historical Provider, MD  tobramycin-dexamethasone [facility]) ophthalmic solution Place 1 drop into the right eye 2 (two) times daily.   Yes Historical Provider, MD  torsemide (DEMADEX) 10 MG tablet Take 10 mg by mouth daily. Or as directed by your physician   Yes Historical Provider, MD  MITIGARE 0.6 MG CAPS Take 0.6 mg by mouth daily. Patient not taking: Reported on 06/07/2016 07/28/15   Larey Dresser, MD   Allergies  Allergen  Reactions  . Bidil [Isosorb Dinitrate-Hydralazine] Other (See Comments)    Headaches   . Spironolactone Other (See Comments)    Hyperkalemia   . Hydralazine Hcl Other (See Comments)    "Fuzzy headed" feeling    FAMILY HISTORY:  Family History  Problem Relation Age of Onset  . Family history unknown: Yes   SOCIAL HISTORY:  reports that he has never smoked. He has never used smokeless tobacco. He reports that he does not drink alcohol or use drugs.  REVIEW OF SYSTEMS: Denies pain    INTERVAL HISTORY:   VITAL SIGNS: Temp:  [97.4 F (36.3 C)-98.8 F (37.1 C)] 97.4 F (36.3 C) (04/13 0349) Pulse Rate:  [72-92] 76 (04/13 0600) Resp:  [18-46] 29 (04/13 0600) BP: (88-111)/(57-83) 110/72 (04/13 0600) SpO2:  [94 %-100 %] 96 % (04/13 0600) FiO2 (%):  [30 %-40 %] 30 % (04/13 0400) Weight:  [101.7 kg (224 lb 3.3 oz)] 101.7 kg (224 lb 3.3 oz) (04/13 0349) HEMODYNAMICS: CVP:  [2 mmHg-7 mmHg] 4 mmHg VENTILATOR SETTINGS: Vent Mode: PRVC FiO2 (%):  [30 %-40 %] 30 % Set Rate:  [14 bmp-22 bmp] 14 bmp Vt Set:  [510 mL] 510 mL PEEP:  [5 cmH20] 5 cmH20 Pressure Support:  [12  cmH20-16 cmH20] 16 cmH20 Plateau Pressure:  [21 cmH20] 21 cmH20 INTAKE / OUTPUT: Intake/Output      04/12 0701 - 04/13 0700   I.V. (mL/kg) 230 (2.3)   NG/GT 1520   IV Piggyback 50   Total Intake(mL/kg) 1800 (17.7)   Urine (mL/kg/hr) 1325 (0.5)   Stool 25 (0)   Total Output 1350   Net +450       Urine Occurrence 1 x     PHYSICAL EXAMINATION: General:  Alert, NAD, well nourished  Neuro:  Alert, shakes his head and writes down answers to questions, moving all extremities  HEENT: Intubated, MMM  Cardiovascular: RRR, no murmur, no calf tenderness, no peripheral edema, SCDS in place  Lungs:  Clear to auscultation bilateral  Abdomen: BS+, soft non- tender, non distended  Musculoskeletal:  Moving extremities Skin: intact, warm and dry    LABS: Cbc  Recent Labs Lab 06/15/16 0520 06/16/16 0433 06/17/16 0402  WBC 15.8* 15.0* 14.9*  HGB 13.8 12.5* 11.8*  HCT 41.9 38.6* 36.3*  PLT 235 241 243    Chemistry   Recent Labs Lab 06/13/16 0504 06/14/16 0347 06/15/16 0520  06/16/16 0433 06/16/16 1625 06/17/16 0402 06/17/16 0404  NA 145 146* 150*  < > 141 142 141 142  K 4.0 3.8 3.9  < > 3.7 4.3 4.3 4.3  CL 100* 99* 102  < > 100* 100* 98* 100*  CO2 28 29 29   < > 28 29 28 28   BUN 112* 132* 160*  < > 152* 148* 154* 151*  CREATININE 3.64* 3.78* 3.73*  < > 3.42* 3.03* 3.01* 3.02*  CALCIUM 9.6 9.9 9.9  < > 9.2 9.3 9.6 9.7  MG 1.9 2.1 2.3  --   --   --   --   --   PHOS 4.7*  4.8* 6.0* 5.4*  5.3*  --  5.9*  --   --  5.2*  GLUCOSE 142* 154* 152*  < > 143* 156* 180* 181*  < > = values in this interval not displayed.  Liver fxn  Recent Labs Lab 06/15/16 0520 06/16/16 0433 06/17/16 0404  ALBUMIN 2.5* 2.4* 2.4*   coags No results for input(s): APTT, INR in the last 168 hours.  Sepsis markers  Recent Labs Lab 06/13/16 0955 06/14/16 0347 06/15/16 0520  PROCALCITON 2.73 3.12 3.27   Cardiac markers  Recent Labs Lab 06/16/16 0500  CKTOTAL 112   BNP No results  for input(s): PROBNP in the last 168 hours. ABG  Recent Labs Lab 06/16/16 1347  PHART 7.455*  PCO2ART 40.1  PO2ART 183.0*  HCO3 28.2*  TCO2 29    CBG trend  Recent Labs Lab 06/16/16 1134 06/16/16 1552 06/16/16 1942 06/16/16 2345 06/17/16 0348  GLUCAP 145* 159* 284* 145* 164*    IMAGING:  ECG:  DIAGNOSES: Active Problems:   SYSTOLIC HEART FAILURE, CHRONIC   Automatic implantable cardioverter-defibrillator in situ   Diaphragm paralysis   Respiratory failure requiring intubation (HCC)   Pressure injury of skin   Acute respiratory failure with hypoxemia (HCC)   Bacteremia due to Streptococcus   Central line infection   Infected defibrillator ([place])   History of ETT   ASSESSMENT / PLAN:  PULMONARY  ASSESSMENT: Acute hypoxemic respiratory failure 2/2 pulmonary edema/ CHF + deconditioning. Weaned to pressure support yesterday but remained tachypneic during wean. Chest xray yesterday showed worsening of pulmonary vascular congestion.  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 Hx of paraganglioma PLAN:   Continue vent support, weaning trial today  Heart failure is following   CARDIOVASCULAR  ASSESSMENT:  Acute on chronic CHF exacerbation, EF 15%  Was down 14 kg since admission but then needed IV fluids for hypernatremia and had pulmonary vascular congestion on yesterdays chest xray and CVP 8-9 yesterday evening. He received 1 dose of IV lasix.  Low normal BP  TEE 4/11- EF 15%, moderate- severe mitral regurgitation  PLAN:  Carvedilol 6.25 mg BID, hydralizine and Isordil  Daily Coox  Heart failure and EP are following   RENAL  ASSESSMENT:   Cardiorenal syndrome, AKI, creatinine stable at 3 up from baseline 1.7 Hypernatremia, resolved PLAN:   BMP q12h  GASTROINTESTINAL  ASSESSMENT:  No current issues  PLAN:   Continue high protein tube feeds   HEMATOLOGIC  ASSESSMENT:   No current issues  PLAN:  Monitor    INFECTIOUS  ASSESSMENT:  Blood cultures 4/2 grew strep viridans in one bottle but repeat 4/5 had no growth, ID recommends 2 week course of rocephin with surveillance blood cultures checked 2 weeks after completion. Afebrile since 4/8. TEE negative for vegetations. PLAN:   Continue rocephin day 9/14 Follow up respiratory cultures 4/9 >> no growth   ENDOCRINE  ASSESSMENT:   Hyperglycemia  PLAN:   Insulin sliding scale  D5 50 cc/hr   NEUROLOGIC  ASSESSMENT:   Anxiety  PLAN:   RasS goal: 0 - -2 Fentanyl drip  Fentanyl 50 mg q1h PRN  Versed 1 mg q4h PRN  PT following   CLINICAL SUMMARY: Pt with fluid overload who has had good diuresis with 14 kg output since admission 4/2, but then reacumulated pulmonary edema 2/2 IV fluids needed for hypernatremia. Had concern for sepsis early in hospital course, last fever was 4/8, he is on rocephin for CAP.  Describes pain similar to gout flair, ordered steroids and colchicine.   I have personally obtained a history, examined the patient, evaluated laboratory and imaging results, formulated the assessment and plan and placed orders. CRITICAL CARE: The patient is critically ill with multiple organ systems failure and requires high complexity decision making for assessment and support, frequent evaluation and titration of therapies, application of advanced monitoring technologies and extensive interpretation of multiple databases.  Critical Care Time devoted to patient care services described in this note is 60 minutes.    Pulmonary and [place] Pager: [phone]  06/17/2016, 6:15 AM    Attending:  I have seen and examined the patient with nurse practitioner/resident and agree with the note above.  We formulated the plan together and I elicited the following history.    Given lasix but didn't diurese yesterday  CXR with bilateral pulmonary edema Still tachypneic on wean Epigastric pain and  vomiting  On vent, interactive Tachypnea on vent, some rhonchi Tachy, regular No edema  CXR yesterday independently reviewed: bilateral air space disease  Acute pulmonary edema > giving lasix 40mg  IV bid today Systolic heart failure> while mild edema on exam last 36 hours due to volume administration, he is near euvolemic, appreciate heart failure Acute respiratory failure with hypoxemia> continue to try to wean daily, but I suspect he may need a tracheostomy  My cc time 30 minutes  Roselie Awkward, MD [place] PCCM Pager: [phone] Cell: [phone] After 3pm or if no response, call [phone]

## 2016-06-17 NOTE — Care Management Note (Signed)
Case Management Note Previous Addendum by Carles Collet   Patient Details  Name: CYRUSS ARATA MRN: [id number] Date of Birth: Mar 20, 1960  Subjective/Objective:   Pt admitted with SOB                 Action/Plan: Reportedly from home.   Pt intubated/extubated and then unfortunately reintubated.  CM will continue to follow for discharge needs  Addendum 4/10 Pt OOBTC w PT. rec CIR/ LTAC at this time. CM will continue to follow PT rec as patient progresses. Patient remains ventilated through ETT, failed wean yesterday. 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    Expected Discharge Date:                  Expected Discharge Plan:     In-House Referral:  Clinical Social Work  Discharge planning Services     Post Acute Care Choice:    Choice offered to:     DME Arranged:    DME Agency:     HH Arranged:    [place] Agency:     Status of Service:  In process, will continue to follow  If discussed at Long Length of Stay Meetings, dates discussed:    Additional Comments: 06/17/2016  CIR recommended however pt remains on ventilator.  CSW consulted for tentative placement  06/16/16 Pt is from home with wife.  Remains intubated.  Attending to discuss pt with HF team as prognosis is poor Maryclare Labrador, RN 06/17/2016, 3:07 PM

## 2016-06-17 NOTE — Progress Notes (Signed)
Advanced Heart Failure Rounding Note   Subjective:    Awake on vent, weaning. Had some vomiting this morning. CVP 7.   Creatinine 3.03->3.01->3.02.   Had good diuresis yesterday with IV lasix - 1325 out.   Studies TEE 06/15/16 with LVEF 15%, No thrombus noted, RV mildly decreased in function, mild/mod LAE, moderate to severe MR with some restriction of posterior leaflet.  No endocarditis noted.  Objective:   Weight Range:  Vital Signs:   Temp:  [97.4 F (36.3 C)-98.8 F (37.1 C)] 97.6 F (36.4 C) (04/13 0744) Pulse Rate:  [72-92] 84 (04/13 0749) Resp:  [18-35] 29 (04/13 0600) BP: (88-111)/(57-83) 111/76 (04/13 0749) SpO2:  [94 %-100 %] 96 % (04/13 0600) FiO2 (%):  [30 %-40 %] 30 % (04/13 0749) Weight:  [224 lb 3.3 oz (101.7 kg)] 224 lb 3.3 oz (101.7 kg) (04/13 0349) Last BM Date: 06/16/16  Weight change: Filed Weights   06/15/16 0500 06/16/16 0426 06/17/16 0349  Weight: 216 lb 14.9 oz (98.4 kg) 214 lb 15.2 oz (97.5 kg) 224 lb 3.3 oz (101.7 kg)    Intake/Output:   Intake/Output Summary (Last 24 hours) at 06/17/16 5883 Last data filed at 06/17/16 0600  Gross per 24 hour  Intake             1530 ml  Output             1350 ml  Net              180 ml     Physical Exam: General: Intubated male, somewhat distressed. Vomiting.   HEENT: R eye opaque, + ETT  Neck: Supple. LIJ central line.No JVP   Cor: PMI laterally displaced,Regular rate and rhythm.1/6 systolic murmur.  Lungs: Rhonchi in upper lobes Abdomen: Obese, soft, NT, ND, no HSM. No bruits or masses. +BS.  Extremities: No cyanosis, clubbing, rash, or edema.  Neuro: Alert & oriented x 3. Cranial nerves grossly intact. Moves all 4 extremities w/o difficulty. Affect flat but appropriate.   Telemetry: Reviewed personally, NSR in 90-100s     Labs: Basic Metabolic Panel:  Recent Labs Lab 06/11/16 0435 06/12/16 0350 06/13/16 0504 06/14/16 0347 06/15/16 0520  06/15/16 1950 06/16/16 0433  06/16/16 1625 06/17/16 0402 06/17/16 0404  NA 145 145 145 146* 150*  < > 143 141 142 141 142  K 3.8 3.9 4.0 3.8 3.9  < > 3.7 3.7 4.3 4.3 4.3  CL 103 101 100* 99* 102  < > 100* 100* 100* 98* 100*  CO2 '28 29 28 29 29  '$ < > '28 28 29 28 28  '$ GLUCOSE 116* 117* 142* 154* 152*  < > 209* 143* 156* 180* 181*  BUN 79* 90* 112* 132* 160*  < > 153* 152* 148* 154* 151*  CREATININE 3.49* 3.29* 3.64* 3.78* 3.73*  < > 3.61* 3.42* 3.03* 3.01* 3.02*  CALCIUM 9.1 9.5 9.6 9.9 9.9  < > 9.5 9.2 9.3 9.6 9.7  MG 2.2 2.0 1.9 2.1 2.3  --   --   --   --   --   --   PHOS 3.1 4.1  4.2 4.7*  4.8* 6.0* 5.4*  5.3*  --   --  5.9*  --   --  5.2*  < > = values in this interval not displayed.  Liver Function Tests:  Recent Labs Lab 06/13/16 0504 06/14/16 0347 06/15/16 0520 06/16/16 0433 06/17/16 0404  ALBUMIN 3.0* 2.9* 2.5* 2.4* 2.4*    CBC:  Recent Labs Lab 06/13/16 0504 06/14/16 0347 06/15/16 0520 06/16/16 0433 06/17/16 0402  WBC 17.6* 18.5* 15.8* 15.0* 14.9*  HGB 14.5 14.4 13.8 12.5* 11.8*  HCT 43.9 43.7 41.9 38.6* 36.3*  MCV 89.4 89.2 89.3 90.0 89.4  PLT 205 230 235 241 243     BNP: BNP (last 3 results)  Recent Labs  06/06/16 1645 06/06/16 1925  BNP 796.0* 553.0*      Transthoracic Echocardiography 06/10/16 Study Conclusions  - Left ventricle: The cavity size was severely dilated. Wall   thickness was normal. The estimated ejection fraction was 20%.   Diffuse hypokinesis. - Aortic valve: There was mild regurgitation. - Mitral valve: There was moderate regurgitation. - Left atrium: The atrium was moderately dilated. - Atrial septum: No defect or patent foramen ovale was identified.      Medications:     Scheduled Medications: . carvedilol  6.25 mg Oral BID WC  . cefTRIAXone (ROCEPHIN)  IV  2 g Intravenous Q24H  . chlorhexidine gluconate (MEDLINE KIT)  15 mL Mouth Rinse BID  . Chlorhexidine Gluconate Cloth  6 each Topical Daily  . colchicine  0.6 mg Oral Daily  .  feeding supplement (VITAL HIGH PROTEIN)  1,000 mL Per Tube Q24H  . heparin  5,000 Units Subcutaneous Q8H  . hydrALAZINE  10 mg Oral Q8H  . insulin aspart  0-9 Units Subcutaneous Q4H  . isosorbide dinitrate  5 mg Oral BID  . mouth rinse  15 mL Mouth Rinse QID  . pantoprazole sodium  40 mg Per Tube Daily  . pneumococcal 23 valent vaccine  0.5 mL Intramuscular Tomorrow-1000  . predniSONE  30 mg Oral Q breakfast  . sodium chloride flush  10-40 mL Intracatheter Q12H  . tobramycin-dexamethasone   Right Eye BID    Infusions: . sodium chloride 83.3 mL/hr at 06/14/16 2200  . sodium chloride 10 mL/hr at 06/10/16 1203  . fentaNYL infusion INTRAVENOUS Stopped (06/13/16 1745)    PRN Medications: fentaNYL, midazolam, sodium chloride flush   Assessment/Plan   1. Septic shock: treated for CAP.  - Remains on ceftriaxone for bronchitis/PNA.  - Coox stable at 66.4% indicating adequate cardiac output.  - Strep viridans 1/2 on initial cultures. Follow up cultures negative.  - ID following - As above, TEE negative for endocarditis or infected ICD.  2. Acute hypoxic respiratory failure - Remains on vent. Per CCM.  - Weaning this am, tachypneic.  3. Acute on chronic renal failure - Renal has signed off - creatinine elevated but stable.  4. Chronic systolic HF EF 31% on echo 06/09/16 - TEE 06/15/16 with no appreciated endocarditis. EF 15% - CVP 7 checked personally.  - Continue coreg 6.25 mg BID - Continue hydralazine 10 mg TID - Continue Isordil 5 mg BID ( able to be crushed for cortrak)  - Pressures soft this am so will not uptitrate.  5. Sinus tachycardia - Rates in the 90-100's 6. Paralyzed right hemidiaphragm  - CCM administering spontaneous breathing trials to work towards weaning vent/extubation.  7. h/o paraganglioma resection  Length of Stay: [place], NP  06/17/2016, 8:08 AM Advanced Heart Failure Team  Pager [phone] M-F 7am-4pm.  Please contact [facility] for  night-coverage after hours (4p -7a ) and weekends on [email or web address]  Patient seen with NP, agree with the above note.   Would not make any major changes from a cardiovascular standpoint today.  BUN remains very high.  CVP 7 and co-ox 66%, do not think  pushing diuresis is going to be that helpful and no indication for inotrope.  Elevated right hemidiaphragm + ?PNA.    SBT today, will see how he dose.   Loralie Champagne 06/17/2016

## 2016-06-18 ENCOUNTER — Inpatient Hospital Stay (HOSPITAL_COMMUNITY): Payer: 59

## 2016-06-18 DIAGNOSIS — B955 Unspecified streptococcus as the cause of diseases classified elsewhere: Secondary | ICD-10-CM

## 2016-06-18 LAB — BASIC METABOLIC PANEL
Anion gap: 14 (ref 5–15)
BUN: 155 mg/dL — AB (ref 6–20)
CHLORIDE: 101 mmol/L (ref 101–111)
CO2: 29 mmol/L (ref 22–32)
CREATININE: 2.5 mg/dL — AB (ref 0.61–1.24)
Calcium: 9.8 mg/dL (ref 8.9–10.3)
GFR calc Af Amer: 31 mL/min — ABNORMAL LOW (ref >60)
GFR calc non Af Amer: 27 mL/min — ABNORMAL LOW (ref >60)
Glucose, Bld: 176 mg/dL — ABNORMAL HIGH (ref 65–99)
POTASSIUM: 4.4 mmol/L (ref 3.5–5.1)
SODIUM: 144 mmol/L (ref 135–145)

## 2016-06-18 LAB — RENAL FUNCTION PANEL
ANION GAP: 14 (ref 5–15)
Albumin: 2.5 g/dL — ABNORMAL LOW (ref 3.5–5.0)
BUN: 159 mg/dL — ABNORMAL HIGH (ref 6–20)
CHLORIDE: 104 mmol/L (ref 101–111)
CO2: 29 mmol/L (ref 22–32)
Calcium: 10 mg/dL (ref 8.9–10.3)
Creatinine, Ser: 2.88 mg/dL — ABNORMAL HIGH (ref 0.61–1.24)
GFR calc non Af Amer: 23 mL/min — ABNORMAL LOW (ref >60)
GFR, EST AFRICAN AMERICAN: 26 mL/min — AB (ref >60)
GLUCOSE: 155 mg/dL — AB (ref 65–99)
POTASSIUM: 4.6 mmol/L (ref 3.5–5.1)
Phosphorus: 5 mg/dL — ABNORMAL HIGH (ref 2.5–4.6)
Sodium: 147 mmol/L — ABNORMAL HIGH (ref 135–145)

## 2016-06-18 LAB — CBC
HEMATOCRIT: 38.6 % — AB (ref 39.0–52.0)
HEMOGLOBIN: 12.5 g/dL — AB (ref 13.0–17.0)
MCH: 29.3 pg (ref 26.0–34.0)
MCHC: 32.4 g/dL (ref 30.0–36.0)
MCV: 90.4 fL (ref 78.0–100.0)
Platelets: 280 10*3/uL (ref 150–400)
RBC: 4.27 MIL/uL (ref 4.22–5.81)
RDW: 14.5 % (ref 11.5–15.5)
WBC: 19.4 10*3/uL — ABNORMAL HIGH (ref 4.0–10.5)

## 2016-06-18 LAB — GLUCOSE, CAPILLARY
GLUCOSE-CAPILLARY: 150 mg/dL — AB (ref 65–99)
GLUCOSE-CAPILLARY: 205 mg/dL — AB (ref 65–99)
Glucose-Capillary: 122 mg/dL — ABNORMAL HIGH (ref 65–99)
Glucose-Capillary: 180 mg/dL — ABNORMAL HIGH (ref 65–99)
Glucose-Capillary: 217 mg/dL — ABNORMAL HIGH (ref 65–99)

## 2016-06-18 MED ORDER — DEXTROSE 5 % IV SOLN
INTRAVENOUS | Status: DC
  Administered 2016-06-18: 100 mL via INTRAVENOUS
  Administered 2016-06-18: 11:00:00 via INTRAVENOUS
  Administered 2016-06-19: 100 mL via INTRAVENOUS
  Administered 2016-06-19 – 2016-06-20 (×3): via INTRAVENOUS

## 2016-06-18 MED ORDER — CHLORHEXIDINE GLUCONATE 0.12 % MT SOLN
15.0000 mL | Freq: Two times a day (BID) | OROMUCOSAL | Status: DC
  Administered 2016-06-18 – 2016-06-20 (×4): 15 mL via OROMUCOSAL

## 2016-06-18 MED ORDER — FREE WATER
150.0000 mL | Freq: Four times a day (QID) | Status: DC
  Administered 2016-06-18 – 2016-06-20 (×9): 150 mL

## 2016-06-18 MED ORDER — PREDNISONE 10 MG PO TABS
10.0000 mg | ORAL_TABLET | Freq: Every day | ORAL | Status: AC
  Administered 2016-06-19: 10 mg via ORAL
  Filled 2016-06-18: qty 1

## 2016-06-18 MED ORDER — FENTANYL CITRATE (PF) 100 MCG/2ML IJ SOLN
12.5000 ug | INTRAMUSCULAR | Status: DC | PRN
  Administered 2016-06-18: 25 ug via INTRAVENOUS
  Filled 2016-06-18: qty 2

## 2016-06-18 MED ORDER — ORAL CARE MOUTH RINSE
15.0000 mL | Freq: Two times a day (BID) | OROMUCOSAL | Status: DC
  Administered 2016-06-18 – 2016-06-19 (×3): 15 mL via OROMUCOSAL

## 2016-06-18 NOTE — Progress Notes (Signed)
PULMONARY  / CRITICAL CARE MEDICINE  Name: SAMI FROH MRN: [id number] DOB: [birth year]    LOS: 82  REFERRING MD :  Dr. Winfred Leeds   CHIEF COMPLAINT:  SOB   BRIEF PATIENT DESCRIPTION: 80M w/ PMHx of CHF, hemidiaphragm s/p paraganglioma resection,CKD, CAD s/p Biventricular ICD, and hypothyroidism who presented w/ SOB and diaphoresis who was initially tx for CHF exacerbation in the ED. He was placed on bipap, gived IV lasix and IV nitro but had tachypnea and diaphoresis which prompted intubation. He developing temp of 104.66F while in the ED, code sepsis was called. Blood cultures grew strep viridans in 1 of 2 bottles.   LINES / TUBES: NG/OG tube 4/6>> Urethral cathether 4/10>> Rectal tube 4/10>>  Airway 4/2>> Right internal jugular central venous triple lumen 4/6 >>   CULTURES: Blood culture x2 4/2 >> viridans strep in 1 bottle  Blood cultures x2 4/5 - no growth  Respiratory culture 4/9 - no growth   ANTIBIOTICS: Vancomycin 4/2 -4/3  Zosyn 4/2-4/5  Azithromycin 4/5-4/9  Rocephin 4/5 >>   SIGNIFICANT EVENTS:  Chest xray 4/2 - mild vascular congestion  Chest xray 4/5 - pulmonary vascular congestion  Renal ultrasound 4/6 - negative for hydronephrosis, 1.7 cm left kidney cyst  Echo 4/5 - EF 20%  Chest xray 4/9 - mild edema and patchy airspace disease, stable  TEE 4/10 mod-severe mitral regurgitation, no vegetations   LEVEL OF CARE:  Critical  PRIMARY SERVICE:  PCCM CONSULTANTS:  elecrophysiology, infectious disease, nephrology, heart failure  CODE STATUS: FULL  DIET:  Feeding tube vital high protein 60 ml/hr  DVT Px: heparin  GI Px: protonix    INTERVAL HISTORY:   Passing SBT Wants tube out No acute events otherwise  VITAL SIGNS: Temp:  [97.1 F (36.2 C)-97.4 F (36.3 C)] 97.1 F (36.2 C) (04/14 0820) Pulse Rate:  [67-100] 87 (04/14 0818) Resp:  [19-65] 29 (04/14 0818) BP: (92-137)/(65-101) 112/80 (04/14 0818) SpO2:  [94 %-100 %] 98 % (04/14 0800) FiO2  (%):  [30 %-40 %] 30 % (04/14 0818) Weight:  [98.4 kg (216 lb 14.9 oz)] 98.4 kg (216 lb 14.9 oz) (04/14 0500) HEMODYNAMICS: CVP:  [5 mmHg-7 mmHg] 7 mmHg VENTILATOR SETTINGS: Vent Mode: CPAP;PSV FiO2 (%):  [30 %-40 %] 30 % Set Rate:  [14 bmp] 14 bmp Vt Set:  [510 mL] 510 mL PEEP:  [5 cmH20] 5 cmH20 Pressure Support:  [8 cmH20] 8 cmH20 Plateau Pressure:  [19 cmH20-22 cmH20] 19 cmH20 INTAKE / OUTPUT: Intake/Output      04/13 0701 - 04/14 0700 04/14 0701 - 04/15 0700   I.V. (mL/kg) 260 (2.6) 10 (0.1)   NG/GT 1320 60   IV Piggyback 50    Total Intake(mL/kg) 1630 (16.6) 70 (0.7)   Urine (mL/kg/hr) 1375 (0.6)    Stool 300 (0.1)    Total Output 1675     Net -45 +70        Stool Occurrence 1 x      PHYSICAL EXAMINATION: General:  Awake, alert on vent HENT: NCAT ETT in place PULM: CTA B, vent supported breathing CV: RRR, no mgr GI: BS+, soft, notnender MSK: normal bulk and tone Neuro: awake, alert on vent, writing to [place]   LABS: Cbc  Recent Labs Lab 06/16/16 0433 06/17/16 0402 06/18/16 0500  WBC 15.0* 14.9* 19.4*  HGB 12.5* 11.8* 12.5*  HCT 38.6* 36.3* 38.6*  PLT 241 243 280    Chemistry   Recent Labs Lab 06/13/16 0504 06/14/16 0347  06/15/16 0520  06/16/16 0433  06/17/16 0404 06/17/16 1554 06/18/16 0500  NA 145 146* 150*  < > 141  < > 142 146* 147*  K 4.0 3.8 3.9  < > 3.7  < > 4.3 4.7 4.6  CL 100* 99* 102  < > 100*  < > 100* 100* 104  CO2 28 29 29   < > 28  < > 28 29 29   BUN 112* 132* 160*  < > 152*  < > 151* 153* 159*  CREATININE 3.64* 3.78* 3.73*  < > 3.42*  < > 3.02* 2.92* 2.88*  CALCIUM 9.6 9.9 9.9  < > 9.2  < > 9.7 10.2 10.0  MG 1.9 2.1 2.3  --   --   --   --   --   --   PHOS 4.7*  4.8* 6.0* 5.4*  5.3*  --  5.9*  --  5.2*  --  5.0*  GLUCOSE 142* 154* 152*  < > 143*  < > 181* 158* 155*  < > = values in this interval not displayed.  Liver fxn  Recent Labs Lab 06/16/16 0433 06/17/16 0404 06/18/16 0500  ALBUMIN 2.4* 2.4* 2.5*   coags No  results for input(s): APTT, INR in the last 168 hours. Sepsis markers  Recent Labs Lab 06/13/16 0955 06/14/16 0347 06/15/16 0520  PROCALCITON 2.73 3.12 3.27   Cardiac markers  Recent Labs Lab 06/16/16 0500  CKTOTAL 112   BNP No results for input(s): PROBNP in the last 168 hours. ABG  Recent Labs Lab 06/16/16 1347  PHART 7.455*  PCO2ART 40.1  PO2ART 183.0*  HCO3 28.2*  TCO2 29    CBG trend  Recent Labs Lab 06/17/16 1546 06/17/16 1942 06/17/16 2320 06/18/16 0436 06/18/16 0752  GLUCAP 145* 184* 93 122* 150*    IMAGING:  ECG:  DIAGNOSES: Active Problems:   SYSTOLIC HEART FAILURE, CHRONIC   Automatic implantable cardioverter-defibrillator in situ   Diaphragm paralysis   Acute respiratory failure with hypoxia (HCC)   Pressure injury of skin   Acute respiratory failure with hypoxemia (HCC)   Bacteremia due to Streptococcus   Central line infection   Infected defibrillator ([place])   History of ETT   ASSESSMENT / PLAN:  PULMONARY  ASSESSMENT: Acute hypoxemic respiratory failure with hypoxemia > improved, passing SBT Acute pulmonary edema> improved  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 Protein related? PLAN:   Stop steroids Monitor BMET and UOP Replace electrolytes as needed Ask renal about elevated BUN today  GASTROINTESTINAL  ASSESSMENT:  No current issues  PLAN:   Stop tube feedings  HEMATOLOGIC  ASSESSMENT:   No current issues  PLAN:  Monitor for bleeding  INFECTIOUS  ASSESSMENT:  Strep veridans bacteremia HCAP?  PLAN:    Continue Rocephin day 10/14 Follow up respiratory cultures 4/9 >> no growth   ENDOCRINE  ASSESSMENT:   Hyperglycemia  PLAN:   Continue SSI, monitor glucose  NEUROLOGIC  ASSESSMENT:   No acute issues PLAN:   Stop sedation protocol Monitor closely in ICU  Feeding: hold tube feedings per protocol Analgesia: prn fentanyl Sedation: discontinue Thromboprophylaxis: sub cutaneous heparin  HOB >30 degrees Ulcer prophylaxis: PPI Glucose control: SSI   My cc time 35 minutes  Roselie Awkward, MD [place] [facility] Pager: [phone] Cell: [phone] After 3pm or if no response, call [phone]

## 2016-06-18 NOTE — Procedures (Signed)
Extubation Procedure Note  Patient Details:   Name: GIANKARLO LEAMER DOB: [birth year] MRN: [id number]   Airway Documentation:     Evaluation  O2 sats: stable throughout Complications: No apparent complications Patient did tolerate procedure well. Bilateral Breath Sounds: Rhonchi   Yes   Patient extubated to 2lnc. Vital signs stable at this time. No complications. Patient is tolerating well at this time. RN at bedside. RT will continue to monitor.  Mcneil Sober 06/18/2016, 10:27 AM

## 2016-06-18 NOTE — Progress Notes (Signed)
Assessment/Plan: 1 CKD 3/AKI w/ severe azotemia slow recovery and making urine 2 VDRF vol, paralyzed diaphragm 3 HTN 4 obesity 5 Hx of paraganglioma 6 CAD 7 CM with EF 10. 8 Hypernatremia, recurrent 9 Bacteremia, strep 10 Hypovolemia  P IVF with D5W, H2O, stop diuretics  Subjective: Interval History: Extubated today, recent lasix  Objective: Vital signs in last 24 hours: Temp:  [97.1 F (36.2 C)-97.4 F (36.3 C)] 97.1 F (36.2 C) (04/14 0820) Pulse Rate:  [67-100] 84 (04/14 1030) Resp:  [19-65] 32 (04/14 1030) BP: (92-137)/(65-101) 112/72 (04/14 1030) SpO2:  [94 %-100 %] 97 % (04/14 1030) FiO2 (%):  [30 %-40 %] 30 % (04/14 0818) Weight:  [98.4 kg (216 lb 14.9 oz)] 98.4 kg (216 lb 14.9 oz) (04/14 0500) Weight change: -3.3 kg (-7 lb 4.4 oz)  Intake/Output from previous day: 04/13 0701 - 04/14 0700 In: 1630 [I.V.:260; NG/GT:1320; IV Piggyback:50] Out: 6063 [Urine:1375; Stool:300] Intake/Output this shift: Total I/O In: 210 [I.V.:30; NG/GT:180] Out: -   General appearance: alert and cooperative Resp: clear to auscultation bilaterally Cardio: regular rate and rhythm, S1, S2 normal, no murmur, click, rub or gallop Extremities: reduced skin turgor  Lab Results:  Recent Labs  06/17/16 0402 06/18/16 0500  WBC 14.9* 19.4*  HGB 11.8* 12.5*  HCT 36.3* 38.6*  PLT 243 280   BMET:  Recent Labs  06/17/16 1554 06/18/16 0500  NA 146* 147*  K 4.7 4.6  CL 100* 104  CO2 29 29  GLUCOSE 158* 155*  BUN 153* 159*  CREATININE 2.92* 2.88*  CALCIUM 10.2 10.0   No results for input(s): PTH in the last 72 hours. Iron Studies: No results for input(s): IRON, TIBC, TRANSFERRIN, FERRITIN in the last 72 hours. Studies/Results: Dg Chest Port 1 View  Result Date: 06/17/2016 CLINICAL DATA:  56 year old male with a history of ventilation EXAM: PORTABLE CHEST 1 VIEW COMPARISON:  06/16/2016, 06/15/2016 FINDINGS: Cardiomediastinal silhouette unchanged in size and contour with  cardiomegaly. Cardiac AICD on the left chest wall unchanged. Unchanged position of right IJ central venous catheter, endotracheal tube, and enteric feeding tube terminating out of the field of view. Low lung volumes with minimal interstitial opacities. No pneumothorax. No large pleural effusion. IMPRESSION: Re- demonstration of cardiomegaly and minimal interstitial opacities. Unchanged endotracheal tube, enteric feeding tube, and right IJ central catheter. Electronically Signed   By: Corrie Mckusick D.O.   On: 06/17/2016 14:11   Dg Chest Port 1 View  Result Date: 06/16/2016 CLINICAL DATA:  Intubation, on ventilator EXAM: PORTABLE CHEST 1 VIEW COMPARISON:  Portable chest x-ray of 06/15/2016 FINDINGS: The tip of the endotracheal tube is approximately 5.1 cm above the carina. Cardiomegaly is stable and there still mild pulmonary vascular congestion present. No pleural effusion is seen. AICD leads remain. NG tube extends below the hemidiaphragm. Right central venous line tip overlies the lower SVC. IMPRESSION: 1. Tip of endotracheal tube approximately 5.1 cm above the carina. 2. Cardiomegaly and mild pulmonary vascular congestion remain. Electronically Signed   By: Ivar Drape M.D.   On: 06/16/2016 11:14    Scheduled: . carvedilol  6.25 mg Oral BID WC  . cefTRIAXone (ROCEPHIN)  IV  2 g Intravenous Q24H  . chlorhexidine  15 mL Mouth Rinse BID  . chlorhexidine gluconate (MEDLINE KIT)  15 mL Mouth Rinse BID  . Chlorhexidine Gluconate Cloth  6 each Topical Daily  . colchicine  0.6 mg Oral Daily  . heparin  5,000 Units Subcutaneous Q8H  . hydrALAZINE  10  mg Oral Q8H  . insulin aspart  0-9 Units Subcutaneous Q4H  . isosorbide dinitrate  5 mg Oral BID  . mouth rinse  15 mL Mouth Rinse QID  . mouth rinse  15 mL Mouth Rinse q12n4p  . pantoprazole sodium  40 mg Per Tube Daily  . pneumococcal 23 valent vaccine  0.5 mL Intramuscular Tomorrow-1000  . [START ON 06/19/2016] predniSONE  10 mg Oral Q breakfast  .  sodium chloride flush  10-40 mL Intracatheter Q12H  . tobramycin-dexamethasone   Right Eye BID      LOS: 12 days   [name] C 06/18/2016,10:48 AM

## 2016-06-19 LAB — RENAL FUNCTION PANEL
ANION GAP: 10 (ref 5–15)
Albumin: 2.4 g/dL — ABNORMAL LOW (ref 3.5–5.0)
BUN: 160 mg/dL — AB (ref 6–20)
CO2: 30 mmol/L (ref 22–32)
Calcium: 9.6 mg/dL (ref 8.9–10.3)
Chloride: 102 mmol/L (ref 101–111)
Creatinine, Ser: 2.25 mg/dL — ABNORMAL HIGH (ref 0.61–1.24)
GFR calc Af Amer: 35 mL/min — ABNORMAL LOW (ref >60)
GFR calc non Af Amer: 31 mL/min — ABNORMAL LOW (ref >60)
GLUCOSE: 147 mg/dL — AB (ref 65–99)
POTASSIUM: 4.2 mmol/L (ref 3.5–5.1)
Phosphorus: 4.1 mg/dL (ref 2.5–4.6)
Sodium: 142 mmol/L (ref 135–145)

## 2016-06-19 LAB — CBC
HCT: 37.8 % — ABNORMAL LOW (ref 39.0–52.0)
HEMOGLOBIN: 12.2 g/dL — AB (ref 13.0–17.0)
MCH: 29.2 pg (ref 26.0–34.0)
MCHC: 32.3 g/dL (ref 30.0–36.0)
MCV: 90.4 fL (ref 78.0–100.0)
Platelets: 296 10*3/uL (ref 150–400)
RBC: 4.18 MIL/uL — ABNORMAL LOW (ref 4.22–5.81)
RDW: 14.3 % (ref 11.5–15.5)
WBC: 18.9 10*3/uL — ABNORMAL HIGH (ref 4.0–10.5)

## 2016-06-19 LAB — GLUCOSE, CAPILLARY
GLUCOSE-CAPILLARY: 162 mg/dL — AB (ref 65–99)
GLUCOSE-CAPILLARY: 176 mg/dL — AB (ref 65–99)
Glucose-Capillary: 128 mg/dL — ABNORMAL HIGH (ref 65–99)
Glucose-Capillary: 147 mg/dL — ABNORMAL HIGH (ref 65–99)
Glucose-Capillary: 150 mg/dL — ABNORMAL HIGH (ref 65–99)
Glucose-Capillary: 165 mg/dL — ABNORMAL HIGH (ref 65–99)

## 2016-06-19 MED ORDER — VITAL HIGH PROTEIN PO LIQD: 60.0000 mL/h | ORAL | Status: DC

## 2016-06-19 MED ORDER — NEPRO/CARBSTEADY PO LIQD
1000.0000 mL | ORAL | Status: DC
  Administered 2016-06-19 – 2016-06-20 (×2): 1000 mL via ORAL
  Filled 2016-06-19 (×5): qty 1000

## 2016-06-19 MED ORDER — GUAIFENESIN ER 600 MG PO TB12
600.0000 mg | ORAL_TABLET | Freq: Two times a day (BID) | ORAL | Status: DC | PRN
  Filled 2016-06-19: qty 1

## 2016-06-19 MED ORDER — SALINE SPRAY 0.65 % NA SOLN: 1.0000 | NASAL | Status: DC | PRN

## 2016-06-19 NOTE — Evaluation (Signed)
Clinical/Bedside Swallow Evaluation Patient Details  Name: [name] MRN: [id number] Date of Birth: [birth year]  Today's Date: 06/19/2016 Time: SLP Start Time (ACUTE ONLY): 1050 SLP Stop Time (ACUTE ONLY): 1105 SLP Time Calculation (min) (ACUTE ONLY): 15 min  Past Medical History:  Past Medical History:  Diagnosis Date  . Arthritis    GOUT  . Automatic implantable cardioverter-defibrillator in situ   . Biventricular ICD (implantable cardiac defibrillator) [employer] ]    DOI 2008/ upgrade 2010/ Gen Change 2014  . Bladder tumor    PT HOSP AT [facility] 4/24 TO 4/26 2014 WITH UTI--AND FOUND TO HAVE BLADDER TUMOR-  . Cardiomyopathy    Idiopathic dilated;   . CHF (congestive heart failure) ([place])   . CKD (chronic kidney disease)    stage III baseline Crt 1.8-2.0  . Coronary artery disease   . Gout 08/03/12   PT C/O OF RIGHT KNEE PAIN AND SWELLING -STATES GOUT FLARE UP - ONGOING SINCE APRIL - BUT SWELLING W/IN LAST WEEK  . Hilar mass    Noted CT 2010  . HTN (hypertension)    Dr. Caryl Comes cardiologist  . Hypothyroidism   . 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 Sleep apnea    CPAP  . Systolic CHF ([place])    EF 94%  . Ventricular tachycardia [facility])    s/p ICD   Past Surgical History:  Past Surgical History:  Procedure Laterality Date  . BIV ICD GENERTAOR CHANGE OUT     DOI 2008/ upgrade 2010/ Gen Change 2014   . CARDIAC CATHETERIZATION     he was found to have normal coronary arteries but with a globally dilated and hypocontractile heart  . CARDIAC DEFIBRILLATOR PLACEMENT     DOI 2008/ upgrade 2010/ Gen Change 2014   . CHEST TUBE INSERTION  09/10/2013  . CHOLECYSTECTOMY    . cornea replacement    . ENDOBRONCHIAL ULTRASOUND Bilateral 10/01/2012   Procedure: ENDOBRONCHIAL ULTRASOUND;  Surgeon: Collene Gobble, MD;  Location: [facility] ENDOSCOPY;  Service: Cardiopulmonary;  Laterality: Bilateral;  . IMPLANTABLE CARDIOVERTER DEFIBRILLATOR (ICD)  GENERATOR CHANGE N/A 05/02/2012   Procedure: ICD GENERATOR CHANGE;  Surgeon: Deboraha Sprang, MD;  Location: [facility] CATH LAB;  Service: Cardiovascular;  Laterality: N/A;  . MEDIASTINOSCOPY N/A 10/25/2012   Procedure: MEDIASTINOSCOPY;  Surgeon: [name], MD;  Location: [place];  Service: Thoracic;  Laterality: N/A;  . MEDIASTINOTOMY  09/10/2013   paratracheal mass    DR Darcey Nora  . MEDIASTINOTOMY CHAMBERLAIN MCNEIL Right 09/10/2013   Procedure: MEDIASTINOTOMY CHAMBERLAIN MCNEIL;  Surgeon: [name], MD;  Location: [place];  Service: Thoracic;  Laterality: Right;  . RESECTION OF MEDIASTINAL MASS Right 02/13/2014   Procedure: RESECTION OF MEDIASTINAL MASS;  Surgeon: [name], MD;  Location: [facility] OR;  Service: Thoracic;  Laterality: Right;  PATIENT NEEDS EPIDURAL CATHETER AND A SWAN GANZ CATHETER (DR. CHRIS MOSER AWARE PER PVT)  . RIGHT HEART CATHETERIZATION N/A 01/27/2012   Procedure: RIGHT HEART CATH;  Surgeon: Jolaine Artist, MD;  Location: [facility] CATH LAB;  Service: Cardiovascular;  Laterality: N/A;  . TONSILLECTOMY    . TRANSURETHRAL RESECTION OF BLADDER TUMOR N/A 08/13/2012   Procedure: TRANSURETHRAL RESECTION OF BLADDER TUMOR (TURBT);  Surgeon: Claybon Jabs, MD;  Location: [facility] ORS;  Service: Urology;  Laterality: N/A;  MITOMYCIN C  . US ECHOCARDIOGRAPHY  03-17-2008, 07-03-2006   EF 15-20%, EF 15-20%  . VIDEO ASSISTED THORACOSCOPY (VATS)/THOROCOTOMY Right  02/13/2014   Procedure: VIDEO ASSISTED THORACOSCOPY (VATS)/THOROCOTOMY;  Surgeon: [name], MD;  Location: [facility] OR;  Service: Thoracic;  Laterality: Right;  PATIENT NEEDS EPIDURAL CATHETER (DR. CHRIS MOSER AWARE PER PVT)  . VIDEO BRONCHOSCOPY N/A 10/25/2012   Procedure: VIDEO BRONCHOSCOPY;  Surgeon: [name], MD;  [facility] OR;  Service: Thoracic;  Laterality: N/A;   HPI:  89M w/ PMHx of CHF, hemidiaphragm s/p paraganglioma resection,CKD, CAD s/p Biventricular ICD, and hypothyroidism who presentedw/ SOB and  diaphoresis who was initially tx for CHF exacerbation in the ED. He was placed on bipap, gived IV lasix and IV nitro but had tachypnea and diaphoresis which prompted intubation 4/2. Hedeveloping temp of 104.16F while in the ED, code sepsis was called. Blood cultures grew strep viridans in 1 of 2 bottles. Failed extubation 4/10 and was reintubated, subsequently extubated 06/18/16. Pt with diaphragm paralysis, acute respiratory failure with hypoxia, central line infection. 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 Pt had Barium swallow 09/16/13 showing no gross mass effect or esophageal displacemetn, hypopharyngeal portion in normal limits, no anatomic explanation for pt's symptoms.   Assessment / Plan / Recommendation Clinical Impression   Patient presents with severe risk for aspiration given decreased respiratory status (RR high 20s-mid 30s), diaphragm paralysis, prolonged intubation for total of 13 days with one reintubation. Patient also reports a history of dysphagia prior to removal of chest paraganglioma, though his swallowing complaints have resolved. Pt is alert, oriented, pleasant and appropriate, follows all basic commands. Voice is mildly hoarse. Oral motor examination unremarkable. Pt respiratory rate fluctuating from 28-33. Performed oral care to maximize safety for PO trials. No overt signs of aspiration or changes in vitals noted with ice chips. With cup and straw sips of water, pt with elevated RR to 38, wet vocal quality x1. With prolonged intubation pt is at elevated risk for silent aspiration. Given this risk and his respiratory status, recommend instrumental assessment to determine appropriateness for diet initiation, to assess airway protection and rule out physiologic impairments impacting swallowing function. Discussed instrumental options (MBS, FEES) with pt and his wife, recommending FEES for visualization of vocal folds.  Continue NPO with alternative means of nutrition/hydration/medication delivery pending instrumental exam, SLP will f/u for instrumental assessment. Patient may have ice chips only after oral care. SLP Visit Diagnosis: Dysphagia, oropharyngeal phase (R13.12)    Aspiration Risk  Severe aspiration risk    Diet Recommendation Ice chips PRN after oral care;NPO;Alternative means - temporary   Liquid Administration via: Spoon Medication Administration: Via alternative means Supervision: Intermittent supervision to cue for compensatory strategies Compensations: Slow rate;Small sips/bites Postural Changes: Seated upright at 90 degrees    Other  Recommendations Oral Care Recommendations: Oral care QID Other Recommendations: Remove water pitcher;Prohibited food (jello, ice cream, thin soups);Have oral suction available   Follow up Recommendations Other (comment) (TBD)      Frequency and Duration            Prognosis Prognosis for Safe Diet Advancement: Good      Swallow Study   General Date of Onset: 06/06/16 HPI: 71M w/ PMHx of CHF, hemidiaphragm s/p paraganglioma resection,CKD, CAD s/p Biventricular ICD, and hypothyroidism who presentedw/ SOB and diaphoresis who was initially tx for CHF exacerbation in the ED. He was placed on bipap, gived IV lasix and IV nitro but had tachypnea and diaphoresis which prompted intubation 4/2. Hedeveloping temp of 104.16F while in the ED, code sepsis was called. Blood  cultures grew strep viridans in 1 of 2 bottles. Failed extubation 4/10 and was reintubated, subsequently extubated 06/18/16. Pt with diaphragm paralysis, acute respiratory failure with hypoxia, central line infection. 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 Pt had Barium swallow 09/16/13 showing no gross mass effect or esophageal displacemetn, hypopharyngeal portion in normal limits, no anatomic explanation for pt's symptoms. Type  of Study: Bedside Swallow Evaluation Previous Swallow Assessment: see HPI Diet Prior to this Study: NPO;Other (Comment) (has cortrak) Temperature Spikes Noted: No Respiratory Status: Nasal cannula History of Recent Intubation: Yes Length of Intubations (days): 13 days (intubated twice) Date extubated: 06/18/16 Behavior/Cognition: Alert;Cooperative;Pleasant mood Oral Cavity Assessment: Within Functional Limits Oral Care Completed by SLP: Yes Oral Cavity - Dentition: Adequate natural dentition Vision: Functional for self-feeding Self-Feeding Abilities: Able to feed self Patient Positioning: Upright in chair Baseline Vocal Quality: Hoarse;Normal Volitional Cough: Weak;Other (Comment) (mildly weak) Volitional Swallow: Able to elicit    Oral/Motor/Sensory Function Overall Oral Motor/Sensory Function: Within functional limits   Ice Chips Ice chips: Within functional limits Presentation: Self Fed;Spoon   Thin Liquid Thin Liquid: Impaired Presentation: Self Fed;Cup;Straw Pharyngeal  Phase Impairments: Change in Vital Signs;Wet Vocal Quality    Nectar Thick Nectar Thick Liquid: Not tested   Honey Thick Honey Thick Liquid: Not tested   Puree Puree: Not tested   Solid   GO   Solid: Not tested       Deneise Lever, MS CF-SLP Speech-Language Pathologist [place] 06/19/2016,11:33 AM

## 2016-06-19 NOTE — Progress Notes (Signed)
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 8 Hypernatremia, recurrent--improving 9 Bacteremia, strep 10 Hypovolemia, improving  P Cont IVF with D5W & H2O, change tube feeds to lower protein  Subjective: Interval History: Extubated, alert  Objective: Vital signs in last 24 hours: Temp:  [97.1 F (36.2 C)-97.6 F (36.4 C)] 97.1 F (36.2 C) (04/15 1227) Pulse Rate:  [74-86] 86 (04/15 1200) Resp:  [0-60] 49 (04/15 1200) BP: (102-128)/(64-92) 102/72 (04/15 1200) SpO2:  [94 %-100 %] 96 % (04/15 1200) Weight change:   Intake/Output from previous day: 04/14 0701 - 04/15 0700 In: 3790 [I.V.:2225; EX/BM:8413; IV Piggyback:50] Out: 2440 [Urine:1675] Intake/Output this shift: Total I/O In: 950 [I.V.:550; NG/GT:400] Out: 650 [Urine:650]  General appearance: alert and cooperative Nose: Nares normal. Septum midline. Mucosa normal. No drainage or sinus tenderness., NGT Resp: clear to auscultation bilaterally Cardio: regular rate and rhythm, S1, S2 normal, no murmur, click, rub or gallop Extremities: extremities normal, atraumatic, no cyanosis or edema  Lab Results:  Recent Labs  06/18/16 0500 06/19/16 0500  WBC 19.4* 18.9*  HGB 12.5* 12.2*  HCT 38.6* 37.8*  PLT 280 296   BMET:  Recent Labs  06/18/16 2058 06/19/16 0500  NA 144 142  K 4.4 4.2  CL 101 102  CO2 29 30  GLUCOSE 176* 147*  BUN 155* 160*  CREATININE 2.50* 2.25*  CALCIUM 9.8 9.6   No results for input(s): PTH in the last 72 hours. Iron Studies: No results for input(s): IRON, TIBC, TRANSFERRIN, FERRITIN in the last 72 hours. Studies/Results: Dg Chest Port 1 View  Result Date: 06/18/2016 CLINICAL DATA:  Extubated. EXAM: PORTABLE CHEST 1 VIEW COMPARISON:  06/17/2016 FINDINGS: Left subclavian AICD device and leads are stable.  Endotracheal tube removed. Feeding tube stable. Right jugular central venous catheter stable. Low volumes. Hazy airspace disease bilaterally and low lung volumes have worsened. Right-sided rib deformities are noted and unchanged. No pneumothorax. IMPRESSION: Increasing bilateral central and basilar opacity likely due to stable airspace disease but worsening volume loss. Electronically Signed   By: Marybelle Killings M.D.   On: 06/18/2016 13:48    Scheduled: . carvedilol  6.25 mg Oral BID WC  . cefTRIAXone (ROCEPHIN)  IV  2 g Intravenous Q24H  . chlorhexidine  15 mL Mouth Rinse BID  . Chlorhexidine Gluconate Cloth  6 each Topical Daily  . colchicine  0.6 mg Oral Daily  . free water  150 mL Per Tube Q6H  . heparin  5,000 Units Subcutaneous Q8H  . hydrALAZINE  10 mg Oral Q8H  . insulin aspart  0-9 Units Subcutaneous Q4H  . isosorbide dinitrate  5 mg Oral BID  . mouth rinse  15 mL Mouth Rinse q12n4p  . pantoprazole sodium  40 mg Per Tube Daily  . pneumococcal 23 valent vaccine  0.5 mL Intramuscular Tomorrow-1000  . sodium chloride flush  10-40 mL Intracatheter Q12H  . tobramycin-dexamethasone   Right Eye BID   Continuous: . sodium chloride 10 mL/hr at 06/19/16 1200  . sodium chloride 10 mL/hr at 06/10/16 1203  . dextrose 100 mL/hr at 06/19/16 1200     LOS: 13 days   Vincente Asbridge C 06/19/2016,2:16 PM

## 2016-06-19 NOTE — Progress Notes (Addendum)
PULMONARY  / CRITICAL CARE MEDICINE  Name: [name] MRN: [id number] DOB: 1960/07/06    LOS: 58  REFERRING MD :  Dr. Winfred Leeds   CHIEF COMPLAINT:  SOB   BRIEF PATIENT DESCRIPTION: 71M w/ PMHx of CHF, hemidiaphragm s/p paraganglioma resection,CKD, CAD s/p Biventricular ICD, and hypothyroidism who presented w/ SOB and diaphoresis who was initially tx for CHF exacerbation in the ED. He was placed on bipap, gived IV lasix and IV nitro but had tachypnea and diaphoresis which prompted intubation 4/2. He developing temp of 104.40F while in the ED, code sepsis was called. Blood cultures grew strep viridans in 1 of 2 bottles.  LINES / TUBES: NG/OG tube, airway 4/2 - 4/15, failed extubation 4/10  Urethral cathether 4/10>> Rectal tube 4/10>>  Right internal jugular central venous triple lumen 4/6 >>   CULTURES: Blood culture x2 4/2 >> viridans strep in 1 bottle  Blood cultures x2 4/5 >> no growth  Respiratory culture 4/9 >> no growth   ANTIBIOTICS: Vancomycin 4/2 -4/3  Zosyn 4/2-4/5  Azithromycin 4/5-4/9  Rocephin 4/5 >>   SIGNIFICANT EVENTS:  Chest xray 4/2 - mild vascular congestion  Chest xray 4/5 - pulmonary vascular congestion  Renal ultrasound 4/6 - negative for hydronephrosis, 1.7 cm left kidney cyst  Echo 4/5 - EF 20%  Chest xray 4/9 - mild edema and patchy airspace disease, stable  TEE 4/10 mod-severe mitral regurgitation, no vegetations   LEVEL OF CARE:  Critical  PRIMARY SERVICE:  PCCM CONSULTANTS:  elecrophysiology, infectious disease, nephrology, heart failure  CODE STATUS: FULL  DIET:  Feeding tube vital high protein 60 ml/hr  DVT Px: heparin  GI Px: protonix   HISTORY OF PRESENT ILLNESS:   PAST MEDICAL HISTORY :  Past Medical History:  Diagnosis Date  . Arthritis    GOUT  . Automatic implantable cardioverter-defibrillator in situ   . Biventricular ICD (implantable cardiac defibrillator) [employer] ]    DOI 2008/ upgrade 2010/ Gen Change 2014  .  Bladder tumor    PT HOSP AT [facility] 4/24 TO 4/26 2014 WITH UTI--AND FOUND TO HAVE BLADDER TUMOR-  . Cardiomyopathy    Idiopathic dilated;   . CHF (congestive heart failure) ([place])   . CKD (chronic kidney disease)    stage III baseline Crt 1.8-2.0  . Coronary artery disease   . Gout 08/03/12   PT C/O OF RIGHT KNEE PAIN AND SWELLING -STATES GOUT FLARE UP - ONGOING SINCE APRIL - BUT SWELLING W/IN LAST WEEK  . Hilar mass    Noted CT 2010  . HTN (hypertension)    Dr. Caryl Comes cardiologist  . Hypothyroidism   . 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 Sleep apnea    CPAP  . Systolic CHF ([place])    EF 33%  . Ventricular tachycardia [facility])    s/p ICD   Past Surgical History:  Procedure Laterality Date  . BIV ICD GENERTAOR CHANGE OUT     DOI 2008/ upgrade 2010/ Gen Change 2014   . CARDIAC CATHETERIZATION     he was found to have normal coronary arteries but with a globally dilated and hypocontractile heart  . CARDIAC DEFIBRILLATOR PLACEMENT     DOI 2008/ upgrade 2010/ Gen Change 2014   . CHEST TUBE INSERTION  09/10/2013  . CHOLECYSTECTOMY    . cornea replacement    . ENDOBRONCHIAL ULTRASOUND Bilateral 10/01/2012   Procedure: ENDOBRONCHIAL ULTRASOUND;  Surgeon: Collene Gobble, MD;  Location: [facility];  Service: Cardiopulmonary;  Laterality: Bilateral;  . IMPLANTABLE CARDIOVERTER DEFIBRILLATOR (ICD) GENERATOR CHANGE N/A 05/02/2012   Procedure: ICD GENERATOR CHANGE;  Surgeon: Deboraha Sprang, MD;  Location: [facility] CATH LAB;  Service: Cardiovascular;  Laterality: N/A;  . MEDIASTINOSCOPY N/A 10/25/2012   Procedure: MEDIASTINOSCOPY;  Surgeon: [name], MD;  Location: [place];  Service: Thoracic;  Laterality: N/A;  . MEDIASTINOTOMY  09/10/2013   paratracheal mass    DR Darcey Nora  . MEDIASTINOTOMY CHAMBERLAIN MCNEIL Right 09/10/2013   Procedure: MEDIASTINOTOMY CHAMBERLAIN MCNEIL;  Surgeon: [name], MD;  Location: [place];  Service: Thoracic;   Laterality: Right;  . RESECTION OF MEDIASTINAL MASS Right 02/13/2014   Procedure: RESECTION OF MEDIASTINAL MASS;  Surgeon: [name], MD;  Location: [facility] OR;  Service: Thoracic;  Laterality: Right;  PATIENT NEEDS EPIDURAL CATHETER AND A SWAN GANZ CATHETER (DR. CHRIS MOSER AWARE PER PVT)  . RIGHT HEART CATHETERIZATION N/A 01/27/2012   Procedure: RIGHT HEART CATH;  Surgeon: Jolaine Artist, MD;  Location: [facility] CATH LAB;  Service: Cardiovascular;  Laterality: N/A;  . TONSILLECTOMY    . TRANSURETHRAL RESECTION OF BLADDER TUMOR N/A 08/13/2012   Procedure: TRANSURETHRAL RESECTION OF BLADDER TUMOR (TURBT);  Surgeon: Claybon Jabs, MD;  Location: [facility] ORS;  Service: Urology;  Laterality: N/A;  MITOMYCIN C  . US ECHOCARDIOGRAPHY  03-17-2008, 07-03-2006   EF 15-20%, EF 15-20%  . VIDEO ASSISTED THORACOSCOPY (VATS)/THOROCOTOMY Right 02/13/2014   Procedure: VIDEO ASSISTED THORACOSCOPY (VATS)/THOROCOTOMY;  Surgeon: [name], MD;  Location: [facility] OR;  Service: Thoracic;  Laterality: Right;  PATIENT NEEDS EPIDURAL CATHETER (DR. CHRIS MOSER AWARE PER PVT)  . VIDEO BRONCHOSCOPY N/A 10/25/2012   Procedure: VIDEO BRONCHOSCOPY;  Surgeon: [name], MD;  Location: [facility] OR;  Service: Thoracic;  Laterality: N/A;   Prior to Admission medications   Medication Sig Start Date End Date Taking? Authorizing Provider  atorvastatin (LIPITOR) 40 MG tablet TAKE 1 TABLET BY MOUTH AT BEDTIME 09/03/15  Yes Shaune Pascal Bensimhon, MD  BREO ELLIPTA 100-25 MCG/INH AEPB Inhale 1 puff into the lungs daily as needed (for shortness of breath).  12/11/14  Yes Historical Provider, MD  carvedilol (COREG) 12.5 MG tablet Take 6.25 mg by mouth 2 (two) times daily with a meal.    Yes Historical Provider, MD  gabapentin (NEURONTIN) 300 MG capsule Take 300 mg by mouth 2 (two) times daily. Pain 06/25/15  Yes Historical Provider, MD  hydrALAZINE (APRESOLINE) 50 MG tablet Take 1 tablet (50 mg total) by mouth 2 (two) times daily. Patient taking  differently: Take 25 mg by mouth 2 (two) times daily.  05/05/16  Yes Deboraha Sprang, MD  HYDROcodone-acetaminophen (NORCO/VICODIN) 5-325 MG tablet Take 0.5 tablets by mouth 2 (two) times daily as needed (for pain).    Yes Historical Provider, MD  magnesium oxide (MAG-OX) 400 MG tablet Take two tablets (800 mg) by mouth twice daily 09/22/15  Yes Deboraha Sprang, MD  omeprazole (PRILOSEC) 20 MG capsule Take 20 mg by mouth at bedtime.   Yes Historical Provider, MD  tobramycin-dexamethasone [facility]) ophthalmic solution Place 1 drop into the right eye 2 (two) times daily.   Yes Historical Provider, MD  torsemide (DEMADEX) 10 MG tablet Take 10 mg by mouth daily. Or as directed by your physician   Yes Historical Provider, MD  MITIGARE 0.6 MG CAPS Take 0.6 mg by mouth daily. Patient not taking: Reported on 06/07/2016 07/28/15   Larey Dresser, MD  Allergies  Allergen Reactions  . Bidil [Isosorb Dinitrate-Hydralazine] Other (See Comments)    Headaches   . Spironolactone Other (See Comments)    Hyperkalemia   . Hydralazine Hcl Other (See Comments)    "Fuzzy headed" feeling    FAMILY HISTORY:  Family History  Problem Relation Age of Onset  . Family history unknown: Yes   SOCIAL HISTORY:  reports that he has never smoked. He has never used smokeless tobacco. He reports that he does not drink alcohol or use drugs.  REVIEW OF SYSTEMS: Reports sinus congestion. Denies chest pain, abdominal pain, shortness of breath.    INTERVAL HISTORY:   VITAL SIGNS: Temp:  [97.1 F (36.2 C)-97.6 F (36.4 C)] 97.4 F (36.3 C) (04/15 0419) Pulse Rate:  [72-87] 83 (04/15 0530) Resp:  [0-52] 29 (04/15 0530) BP: (102-128)/(64-92) 119/78 (04/15 0530) SpO2:  [94 %-100 %] 94 % (04/15 0530) FiO2 (%):  [30 %] 30 % (04/14 0818) HEMODYNAMICS: CVP:  [6 mmHg-7 mmHg] 6 mmHg VENTILATOR SETTINGS: Vent Mode: CPAP;PSV FiO2 (%):  [30 %] 30 % PEEP:  [5 cmH20] 5 cmH20 Pressure Support:  [8 cmH20] 8 cmH20 INTAKE /  OUTPUT: Intake/Output      04/14 0701 - 04/15 0700   I.V. (mL/kg) 1825 (18.5)   NG/GT 1350   IV Piggyback 50   Total Intake(mL/kg) 3225 (32.8)   Urine (mL/kg/hr) 1025 (0.4)   Total Output 1025   Net +2200         PHYSICAL EXAMINATION: General:  Alert, NAD, well nourished  Neuro:  Alert, moving all extremities  HEENT: MMM, voice is hoarse  Cardiovascular: RRR, no murmur, no calf tenderness, no peripheral edema, SCDS in place  Lungs: Some wheezing bilateral, abdominal breathing, speaking in full sentences  Abdomen: BS+, soft non- tender, non distended  Musculoskeletal:  Moving extremities, good tone  Skin: intact, warm and dry    LABS: Cbc  Recent Labs Lab 06/17/16 0402 06/18/16 0500 06/19/16 0500  WBC 14.9* 19.4* 18.9*  HGB 11.8* 12.5* 12.2*  HCT 36.3* 38.6* 37.8*  PLT 243 280 296    Chemistry   Recent Labs Lab 06/13/16 0504 06/14/16 0347 06/15/16 0520  06/17/16 0404  06/18/16 0500 06/18/16 2058 06/19/16 0500  NA 145 146* 150*  < > 142  < > 147* 144 142  K 4.0 3.8 3.9  < > 4.3  < > 4.6 4.4 4.2  CL 100* 99* 102  < > 100*  < > 104 101 102  CO2 28 29 29   < > 28  < > 29 29 30   BUN 112* 132* 160*  < > 151*  < > 159* 155* 160*  CREATININE 3.64* 3.78* 3.73*  < > 3.02*  < > 2.88* 2.50* 2.25*  CALCIUM 9.6 9.9 9.9  < > 9.7  < > 10.0 9.8 9.6  MG 1.9 2.1 2.3  --   --   --   --   --   --   PHOS 4.7*  4.8* 6.0* 5.4*  5.3*  < > 5.2*  --  5.0*  --  4.1  GLUCOSE 142* 154* 152*  < > 181*  < > 155* 176* 147*  < > = values in this interval not displayed.  Liver fxn  Recent Labs Lab 06/17/16 0404 06/18/16 0500 06/19/16 0500  ALBUMIN 2.4* 2.5* 2.4*   coags No results for input(s): APTT, INR in the last 168 hours. Sepsis markers  Recent Labs Lab 06/13/16 0955 06/14/16 0347 06/15/16  0520  PROCALCITON 2.73 3.12 3.27   Cardiac markers  Recent Labs Lab 06/16/16 0500  CKTOTAL 112   BNP No results for input(s): PROBNP in the last 168 hours. ABG  Recent  Labs Lab 06/16/16 1347  PHART 7.455*  PCO2ART 40.1  PO2ART 183.0*  HCO3 28.2*  TCO2 29    CBG trend  Recent Labs Lab 06/18/16 1205 06/18/16 1618 06/18/16 1958 06/19/16 0011 06/19/16 0420  GLUCAP 180* 217* 205* 147* 150*    IMAGING:  ECG:  DIAGNOSES: Active Problems:   SYSTOLIC HEART FAILURE, CHRONIC   Automatic implantable cardioverter-defibrillator in situ   Diaphragm paralysis   Acute respiratory failure with hypoxia (HCC)   Pressure injury of skin   Acute respiratory failure with hypoxemia (HCC)   Bacteremia due to Streptococcus   Central line infection   Infected defibrillator (HCC)   History of ETT   ASSESSMENT / PLAN:  PULMONARY  ASSESSMENT: Acute hypoxemic respiratory failure 2/2 pulmonary edema/ CHF + paralyzed hemidiaphragm Pulmonary edema, improved  Trace right pleural effusion  Hx of paralyzed hemidiaphragm and paraganglioma  Cardiorenal syndrome  PLAN:   Extubated yesterday, on 2 L [place]  Heart failure is following On rocephin  Aspiration precautions  Ordered mucinex    CARDIOVASCULAR  ASSESSMENT:  Acute on chronic CHF exacerbation, EF 15%  Pulmonary edema, improving  Hypotension at baseline  TEE 4/11- EF 15%, moderate- severe mitral regurgitation  PLAN:  Carvedilol 6.25 mg BID  hydralazine and nitrates Heart failure and EP are following   RENAL  ASSESSMENT:   Hypernatremia, resolved  CKD w AKI crt stable at 3 up from b/l 1.7  PLAN:   BMP q12h Nephrology is onboard   GASTROINTESTINAL  ASSESSMENT:  No current issues  PLAN:   Continue high protein tube feeds  SLP evaluation today   HEMATOLOGIC  ASSESSMENT:   No current issues  PLAN:  Monitor   INFECTIOUS  ASSESSMENT:  Leukocytosis improving  Blood cultures 4/2 grew strep viridans in one bottle but repeat 4/5 had no growth, ID recommends 2 week course of rocephin, he is on day 8. He had a fever 4/8 but has been afebrile since. TEE negative for vegetations.  PLAN:    Continue rocephin day 8/14 Follow up respiratory cultures 4/9 >> no growth to date   ENDOCRINE  ASSESSMENT:   Hyperglycemia  PLAN:   Insulin sliding scale  D5 50 cc/hr   NEUROLOGIC  ASSESSMENT:   Anxiety  PLAN:   RasS goal: 0 - -2 Fentanyl drip  Fentanyl 50 mg q1h PRN  Versed 1 mg q4h PRN  PT following   CLINICAL SUMMARY: Pt with fluid overload who has had good diuresis with 14 kg output since admission 4/2, he continues to have tachypnea with poor tidal volume but this is improving. Had concern for sepsis early in hospital course, last fever was 4/8, he is on rocephin for CAP.  Describes pain similar to gout flair today, will order steroids and colchicine.   I have personally obtained a history, examined the patient, evaluated laboratory and imaging results, formulated the assessment and plan and placed orders. CRITICAL CARE: The patient is critically ill with multiple organ systems failure and requires high complexity decision making for assessment and support, frequent evaluation and titration of therapies, application of advanced monitoring technologies and extensive interpretation of multiple databases. Critical Care Time devoted to patient care services described in this note is 30 minutes.    Pulmonary and [place]  Pager: [phone]  06/19/2016, 6:51 AM   Attending:  I have seen and examined the patient with nurse practitioner/resident and agree with the note above.  We formulated the plan together and I elicited the following history.    Some chest congestion Extubated yesterday  On exam:  Gen: sitting up in chair HEENT: NCAT OP clear, NG in place PULM: Some rhonchi, normal effort  Systolic heart failure> appears euvolemic today, continue care per cardiology Acute respiratory failure with hypoxemia > better, out of bed today Elevated BUN> fluid positive overnight, would prefer to keep euvolemic today, decrease protein in  diet and stop prednisone HCAP? > continue ceftriaxone  Strep viridans> continue ceftriaxone  To SDU, TRH  Roselie Awkward, MD [place] P[facility] Pager: [phone] Cell: [phone] After 3pm or if no response, call [phone]

## 2016-06-20 ENCOUNTER — Inpatient Hospital Stay (HOSPITAL_COMMUNITY): Payer: 59

## 2016-06-20 DIAGNOSIS — J9621 Acute and chronic respiratory failure with hypoxia: Secondary | ICD-10-CM

## 2016-06-20 LAB — RENAL FUNCTION PANEL
Albumin: 2.5 g/dL — ABNORMAL LOW (ref 3.5–5.0)
Anion gap: 9 (ref 5–15)
BUN: 118 mg/dL — ABNORMAL HIGH (ref 6–20)
CHLORIDE: 99 mmol/L — AB (ref 101–111)
CO2: 28 mmol/L (ref 22–32)
CREATININE: 1.79 mg/dL — AB (ref 0.61–1.24)
Calcium: 9.3 mg/dL (ref 8.9–10.3)
GFR, EST AFRICAN AMERICAN: 47 mL/min — AB (ref >60)
GFR, EST NON AFRICAN AMERICAN: 40 mL/min — AB (ref >60)
Glucose, Bld: 141 mg/dL — ABNORMAL HIGH (ref 65–99)
POTASSIUM: 4.1 mmol/L (ref 3.5–5.1)
Phosphorus: 3.6 mg/dL (ref 2.5–4.6)
Sodium: 136 mmol/L (ref 135–145)

## 2016-06-20 LAB — GLUCOSE, CAPILLARY
GLUCOSE-CAPILLARY: 122 mg/dL — AB (ref 65–99)
GLUCOSE-CAPILLARY: 135 mg/dL — AB (ref 65–99)
Glucose-Capillary: 103 mg/dL — ABNORMAL HIGH (ref 65–99)
Glucose-Capillary: 115 mg/dL — ABNORMAL HIGH (ref 65–99)
Glucose-Capillary: 131 mg/dL — ABNORMAL HIGH (ref 65–99)
Glucose-Capillary: 134 mg/dL — ABNORMAL HIGH (ref 65–99)
Glucose-Capillary: 95 mg/dL (ref 65–99)

## 2016-06-20 MED ORDER — ALPRAZOLAM 0.25 MG PO TABS
0.2500 mg | ORAL_TABLET | Freq: Three times a day (TID) | ORAL | Status: DC | PRN
  Administered 2016-06-20: 0.25 mg via ORAL
  Filled 2016-06-20: qty 1

## 2016-06-20 MED ORDER — PANTOPRAZOLE SODIUM 40 MG PO TBEC
40.0000 mg | DELAYED_RELEASE_TABLET | Freq: Every day | ORAL | Status: DC
  Administered 2016-06-20 – 2016-06-24 (×5): 40 mg via ORAL
  Filled 2016-06-20 (×6): qty 1

## 2016-06-20 MED ORDER — ORAL CARE MOUTH RINSE
15.0000 mL | Freq: Two times a day (BID) | OROMUCOSAL | Status: DC
  Administered 2016-06-20 – 2016-06-24 (×8): 15 mL via OROMUCOSAL

## 2016-06-20 MED ORDER — PANTOPRAZOLE SODIUM 40 MG PO PACK
40.0000 mg | PACK | Freq: Every day | ORAL | Status: DC
  Filled 2016-06-20: qty 20

## 2016-06-20 NOTE — Progress Notes (Signed)
Physical Therapy Treatment Patient Details Name: [name] MRN: [id number] DOB: 1960-07-22 Today's Date: 06/20/2016    History of Present Illness 38M w/ PMHx of CHF, gout, cardiomyopathy, hemidiaphragm s/p paraganglioma resection, CKD, CAD, ICD, and hypothyroidism who presents w/ SOB who was initially tx for CHF exacerbation in the ED and then worked up for sepsis. Intubated 4/2. Extubated 4/15.    PT Comments    Pt making good progress. Needs OT consult. Good rehab candidate.   Follow Up Recommendations  CIR;Supervision/Assistance - 24 hour     Equipment Recommendations  Rolling walker with 5" wheels;3in1 (PT)    Recommendations for Other Services OT consult     Precautions / Restrictions Precautions Precautions: Fall Restrictions Weight Bearing Restrictions: No    Mobility  Bed Mobility               General bed mobility comments: Pt up in chair  Transfers Overall transfer level: Needs assistance Equipment used: Rolling walker (2 wheeled) Transfers: Sit to/from Stand Sit to Stand: +2 physical assistance;Mod assist         General transfer comment: Assist to bring hips a;nd trunk up.  Verbal cues for hand placement  Ambulation/Gait Ambulation/Gait assistance: Mod assist;+2 safety/equipment Ambulation Distance (Feet): 10 Feet (10' x 1, 5' x 1) Assistive device: Rolling walker (2 wheeled) Gait Pattern/deviations: Step-through pattern;Decreased step length - right;Decreased step length - left;Shuffle;Trunk flexed Gait velocity: decr Gait velocity interpretation: Below normal speed for age/gender General Gait Details: Assist for balance and support. Verbal cues to stand more upright   Stairs            Wheelchair Mobility    Modified Rankin (Stroke Patients Only)       Balance Overall balance assessment: Needs assistance Sitting-balance support: Single extremity supported;Feet supported Sitting balance-Leahy Scale: Poor Sitting balance  - Comments: UE support   Standing balance support: Bilateral upper extremity supported Standing balance-Leahy Scale: Poor Standing balance comment: walker and min a for static standing                            Cognition Arousal/Alertness: Awake/alert Behavior During Therapy: WFL for tasks assessed/performed Overall Cognitive Status: Within Functional Limits for tasks assessed                                        Exercises      General Comments        Pertinent Vitals/Pain Pain Assessment: Faces Faces Pain Scale: Hurts a little bit Pain Location: knees Pain Descriptors / Indicators: Sore Pain Intervention(s): Limited activity within patient's tolerance;Monitored during session    Home Living                      Prior Function            PT Goals (current goals can now be found in the care plan section) Progress towards PT goals: Progressing toward goals    Frequency    Min 3X/week      PT Plan Discharge plan needs to be updated    Co-evaluation             End of Session Equipment Utilized During Treatment: Gait belt Activity Tolerance: Patient tolerated treatment well Patient left: in chair;with chair alarm set;with call bell/phone within reach Nurse Communication: Mobility status PT  Visit Diagnosis: Unsteadiness on feet (R26.81);Muscle weakness (generalized) (M62.81);Difficulty in walking, not elsewhere classified (R26.2)     Time: 4353-9122 PT Time Calculation (min) (ACUTE ONLY): 26 min  Charges:  $Gait Training: 23-37 mins                    G Codes:       [facility] PT [place] 06/20/2016, 4:01 PM

## 2016-06-20 NOTE — Care Management Note (Signed)
Case Management Note Previous Addendum by Carles Collet   Patient Details  Name: [name] MRN: [id number] Date of Birth: 1960-10-13  Subjective/Objective:   Pt admitted with SOB                 Action/Plan: Reportedly from home.   Pt intubated/extubated and then unfortunately reintubated.  CM will continue to follow for discharge needs  Addendum 4/10 Pt OOBTC w PT. rec CIR/ LTAC at this time. CM will continue to follow PT rec as patient progresses. Patient remains ventilated through ETT, failed wean yesterday. 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    Expected Discharge Date:                  Expected Discharge Plan:     In-House Referral:  Clinical Social Work  Discharge planning Services     Post Acute Care Choice:    Choice offered to:     DME Arranged:    DME Agency:     HH Arranged:    [place] Agency:     Status of Service:  In process, will continue to follow  If discussed at Long Length of Stay Meetings, dates discussed:    Additional Comments: 06/20/2016  Pt is extubated.  CIR contacted and informed  06/17/16 CIR recommended however pt remains on ventilator.  CSW consulted for tentative placement  06/16/16 Pt is from home with wife.  Remains intubated.  Attending to discuss pt with HF team as prognosis is poor Maryclare Labrador, RN 06/20/2016, 11:34 AM

## 2016-06-20 NOTE — Progress Notes (Signed)
Patient in bed attempting to sit up, patient appears distressed. Patient states he is having trouble breathing and catching his breath. Also complaining of pain in right lower rib area. MD notified. Patient requesting to get back in chair. STAT chest xray order and transfer order discontinued. New orders received. Patient coached to slow breathing. Placed in chair after speaking to MD and xray reports back. Will continue to monitor overnight in ICU.

## 2016-06-20 NOTE — Procedures (Signed)
Objective Swallowing Evaluation: Type of Study: FEES-Fiberoptic Endoscopic Evaluation of Swallow  Patient Details  Name: [name] MRN: [id number] Date of Birth: 12-30-1960  Today's Date: 06/20/2016 Time: SLP Start Time (ACUTE ONLY): 1315-SLP Stop Time (ACUTE ONLY): 1415 SLP Time Calculation (min) (ACUTE ONLY): 60 min  Past Medical History:  Past Medical History:  Diagnosis Date  . Arthritis    GOUT  . Automatic implantable cardioverter-defibrillator in situ   . Biventricular ICD (implantable cardiac defibrillator) [employer]onic ]    DOI 2008/ upgrade 2010/ Gen Change 2014  . Bladder tumor    PT HOSP AT [facility] 4/24 TO 4/26 2014 WITH UTI--AND FOUND TO HAVE BLADDER TUMOR-  . Cardiomyopathy    Idiopathic dilated;   . CHF (congestive heart failure) ([place])   . CKD (chronic kidney disease)    stage III baseline Crt 1.8-2.0  . Coronary artery disease   . Gout 08/03/12   PT C/O OF RIGHT KNEE PAIN AND SWELLING -STATES GOUT FLARE UP - ONGOING SINCE APRIL - BUT SWELLING W/IN LAST WEEK  . Hilar mass    Noted CT 2010  . HTN (hypertension)    Dr. Caryl Comes cardiologist  . Hypothyroidism   . 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 Sleep apnea    CPAP  . Systolic CHF ([place])    EF 52%  . Ventricular tachycardia [facility])    s/p ICD   Past Surgical History:  Past Surgical History:  Procedure Laterality Date  . BIV ICD GENERTAOR CHANGE OUT     DOI 2008/ upgrade 2010/ Gen Change 2014   . CARDIAC CATHETERIZATION     he was found to have normal coronary arteries but with a globally dilated and hypocontractile heart  . CARDIAC DEFIBRILLATOR PLACEMENT     DOI 2008/ upgrade 2010/ Gen Change 2014   . CHEST TUBE INSERTION  09/10/2013  . CHOLECYSTECTOMY    . cornea replacement    . ENDOBRONCHIAL ULTRASOUND Bilateral 10/01/2012   Procedure: ENDOBRONCHIAL ULTRASOUND;  Surgeon: Collene Gobble, MD;  Location: [facility] ENDOSCOPY;  Service: Cardiopulmonary;   Laterality: Bilateral;  . IMPLANTABLE CARDIOVERTER DEFIBRILLATOR (ICD) GENERATOR CHANGE N/A 05/02/2012   Procedure: ICD GENERATOR CHANGE;  Surgeon: Deboraha Sprang, MD;  Location: [facility] CATH LAB;  Service: Cardiovascular;  Laterality: N/A;  . MEDIASTINOSCOPY N/A 10/25/2012   Procedure: MEDIASTINOSCOPY;  Surgeon: [name], MD;  Location: [place];  Service: Thoracic;  Laterality: N/A;  . MEDIASTINOTOMY  09/10/2013   paratracheal mass    DR Darcey Nora  . MEDIASTINOTOMY CHAMBERLAIN MCNEIL Right 09/10/2013   Procedure: MEDIASTINOTOMY CHAMBERLAIN MCNEIL;  Surgeon: [name], MD;  Location: [place];  Service: Thoracic;  Laterality: Right;  . RESECTION OF MEDIASTINAL MASS Right 02/13/2014   Procedure: RESECTION OF MEDIASTINAL MASS;  Surgeon: [name], MD;  Location: [facility] OR;  Service: Thoracic;  Laterality: Right;  PATIENT NEEDS EPIDURAL CATHETER AND A SWAN GANZ CATHETER (DR. CHRIS MOSER AWARE PER PVT)  . RIGHT HEART CATHETERIZATION N/A 01/27/2012   Procedure: RIGHT HEART CATH;  Surgeon: Jolaine Artist, MD;  Location: [facility] CATH LAB;  Service: Cardiovascular;  Laterality: N/A;  . TONSILLECTOMY    . TRANSURETHRAL RESECTION OF BLADDER TUMOR N/A 08/13/2012   Procedure: TRANSURETHRAL RESECTION OF BLADDER TUMOR (TURBT);  Surgeon: Claybon Jabs, MD;  Location: [facility] ORS;  Service: Urology;  Laterality: N/A;  MITOMYCIN C  . US ECHOCARDIOGRAPHY  03-17-2008, 07-03-2006   EF 15-20%, EF  15-20%  . VIDEO ASSISTED THORACOSCOPY (VATS)/THOROCOTOMY Right 02/13/2014   Procedure: VIDEO ASSISTED THORACOSCOPY (VATS)/THOROCOTOMY;  Surgeon: [name], MD;  Location: [facility] OR;  Service: Thoracic;  Laterality: Right;  PATIENT NEEDS EPIDURAL CATHETER (DR. CHRIS MOSER AWARE PER PVT)  . VIDEO BRONCHOSCOPY N/A 10/25/2012   Procedure: VIDEO BRONCHOSCOPY;  Surgeon: [name], MD;  Location[facility] OR;  Service: Thoracic;  Laterality: N/A;   HPI: 48M w/ PMHx of CHF, hemidiaphragm s/p paraganglioma resection,CKD, CAD s/p  Biventricular ICD, and hypothyroidism who presentedw/ SOB and diaphoresis who was initially tx for CHF exacerbation in the ED. He was placed on bipap, gived IV lasix and IV nitro but had tachypnea and diaphoresis which prompted intubation 4/2. Hedeveloping temp of 104.73F while in the ED, code sepsis was called. Blood cultures grew strep viridans in 1 of 2 bottles. Failed extubation 4/10 and was reintubated, subsequently extubated 06/18/16. Pt with diaphragm paralysis, acute respiratory failure with hypoxia, central line infection. 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 Pt had Barium swallow 09/16/13 showing no gross mass effect or esophageal displacemetn, hypopharyngeal portion in normal limits, no anatomic explanation for pt's symptoms.  Subjective: Upright in chair, wife present   Assessment / Plan / Recommendation  CHL IP CLINICAL IMPRESSIONS 06/20/2016  Clinical Impression Pt presents with functional oropharyngeal swallow marked by adequate laryngeal closure during the swallow; no pharyngeal residue; one incident of trace penetration of thin liquids, not repeated during multiple f/u boluses; no aspiration.  Pt with good bilateral mobility of vocal folds.  Recommend initiating a regular consistency diet; thin liquids; meds whole in liquid.  F/u briefly to ensure toleration of oral diet, education.  Discussed results/recs with pt, wife, Therapist, sports.   SLP Visit Diagnosis Dysphagia, unspecified (R13.10)  Attention and concentration deficit following --  Frontal lobe and executive function deficit following --  Impact on safety and function Mild aspiration risk      CHL IP TREATMENT RECOMMENDATION 06/20/2016  Treatment Recommendations Therapy as outlined in treatment plan below     Prognosis 06/19/2016  Prognosis for Safe Diet Advancement Good  Barriers to Reach Goals --  Barriers/Prognosis Comment --    CHL IP DIET RECOMMENDATION  06/20/2016  SLP Diet Recommendations Regular solids;Thin liquid  Liquid Administration via Straw;Cup  Medication Administration Whole meds with liquid  Compensations Slow rate;Small sips/bites  Postural Changes Seated upright at 90 degrees      CHL IP OTHER RECOMMENDATIONS 06/20/2016  Recommended Consults --  Oral Care Recommendations Oral care BID  Other Recommendations --      CHL IP FOLLOW UP RECOMMENDATIONS 06/19/2016  Follow up Recommendations Other (comment)      CHL IP FREQUENCY AND DURATION 06/20/2016  Speech Therapy Frequency (ACUTE ONLY) min 1 x/week  Treatment Duration 1 week           CHL IP ORAL PHASE 06/20/2016  Oral Phase WFL  Oral - Pudding Teaspoon --  Oral - Pudding Cup --  Oral - Honey Teaspoon --  Oral - Honey Cup --  Oral - Nectar Teaspoon --  Oral - Nectar Cup --  Oral - Nectar Straw --  Oral - Thin Teaspoon --  Oral - Thin Cup --  Oral - Thin Straw --  Oral - Puree --  Oral - Mech Soft --  Oral - Regular --  Oral - Multi-Consistency --  Oral - Pill --  Oral Phase - Comment --    CHL IP  PHARYNGEAL PHASE 06/20/2016  Pharyngeal Phase WFL  Pharyngeal- Pudding Teaspoon --  Pharyngeal --  Pharyngeal- Pudding Cup --  Pharyngeal --  Pharyngeal- Honey Teaspoon --  Pharyngeal --  Pharyngeal- Honey Cup --  Pharyngeal --  Pharyngeal- Nectar Teaspoon --  Pharyngeal --  Pharyngeal- Nectar Cup --  Pharyngeal --  Pharyngeal- Nectar Straw --  Pharyngeal --  Pharyngeal- Thin Teaspoon --  Pharyngeal --  Pharyngeal- Thin Cup --  Pharyngeal --  Pharyngeal- Thin Straw --  Pharyngeal --  Pharyngeal- Puree --  Pharyngeal --  Pharyngeal- Mechanical Soft --  Pharyngeal --  Pharyngeal- Regular --  Pharyngeal --  Pharyngeal- Multi-consistency --  Pharyngeal --  Pharyngeal- Pill --  Pharyngeal --  Pharyngeal Comment --     No flowsheet data found.  No flowsheet data found.  [name] 06/20/2016, 3:08 PM

## 2016-06-20 NOTE — Progress Notes (Signed)
[place] TEAM 1 - Stepdown/ICU TEAM  [name]  KAJ:[id number] DOB: [birth year] DOA: 06/06/2016 PCP: Gennette Pac, MD    Brief Narrative:  2T w/ Hx of CHF, hemidiaphragm s/p paraganglioma resection,CKD, CAD s/p BiV ICD, and hypothyroidism who presentedw/ SOB and diaphoresis.  He was initially tx for a CHF exacerbation w/ bipap, IV lasix, and IV nitro.  Nonetheless he developed severe tachypnea and diaphoresis requiring intubation 4/2. He developed a temp of 104.55F while still in the ED, and blood cultures grew strep viridans in 1 of 2 bottles.  Significant Events: 4/2 admit - intubated 4/5 TTE - EF 20% 4/6 renal US - no hydro - 1.7cm L kidney cyst 4/10 TEE mod/severe mitral regurg - no vegetations 4/15 extubated  Subjective: Resting comfortably in bed.  Denies cp, sob, n/v, or abdom pain.  Is awake and conversant.    Assessment & Plan:  Acute hypoxic respiratory failure due to pulmonary edema  Now requiring only 2L [place] - wean O2 as able   Acute exacerbation of Chronic systolic HF EF 59% on TTE 06/09/16 CHF Team following - net positive ~4L - baseline dry wgt appears to be ~102kg (but is quite variable)  Filed Weights   06/16/16 0426 06/17/16 0349 06/18/16 0500  Weight: 97.5 kg (214 lb 15.2 oz) 101.7 kg (224 lb 3.3 oz) 98.4 kg (216 lb 14.9 oz)    Acute renal failure on CKD stage 3 - marked azotemia  Baseline crt ~1.9 - renal fxn has returned to baseline, but BUN remains markedly elevated - hydrating presently   Strep viridans bacteremia  TEE 06/15/16 with no appreciated endocarditis - 1 of 4 total blood cx positive - plan is for 14 days of rocephin per ID   Paralyzed right hemidiaphragm s/p paraganglioma resection  HTN Presently well controlled   CAD  Obesity - Body mass index is 35.01 kg/m.   DVT prophylaxis: heparin  Code Status: FULL CODE Family Communication: no family present at time of exam  Disposition Plan: transfer to SDU   Consultants:   PCCM EP ID Nephrology  CHF Team  Antimicrobials:  Vancomycin 4/2 > 4/3  Zosyn 4/2 > 4/5  Azithromycin 4/5 > 4/9  Rocephin 4/5 >   Objective: Blood pressure 105/69, pulse 82, temperature 97.4 F (36.3 C), temperature source Oral, resp. rate (!) 27, height 5\' 6"  (1.676 m), weight 98.4 kg (216 lb 14.9 oz), SpO2 96 %.  Intake/Output Summary (Last 24 hours) at 06/20/16 0818 Last data filed at 06/20/16 0800  Gross per 24 hour  Intake             4136 ml  Output             1825 ml  Net             2311 ml   Filed Weights   06/16/16 0426 06/17/16 0349 06/18/16 0500  Weight: 97.5 kg (214 lb 15.2 oz) 101.7 kg (224 lb 3.3 oz) 98.4 kg (216 lb 14.9 oz)    Examination: General: No acute respiratory distress Lungs: Clear to auscultation bilaterally without wheezes or crackles - poor air movement R base  Cardiovascular: Regular rate and rhythm without rub  Abdomen: Nontender, protuberent, soft, bowel sounds positive, no rebound, no ascites, no appreciable mass Extremities: No significant cyanosis, clubbing, or edema bilateral lower extremities  CBC:  Recent Labs Lab 06/15/16 0520 06/16/16 0433 06/17/16 0402 06/18/16 0500 06/19/16 0500  WBC 15.8* 15.0* 14.9* 19.4* 18.9*  HGB 13.8  12.5* 11.8* 12.5* 12.2*  HCT 41.9 38.6* 36.3* 38.6* 37.8*  MCV 89.3 90.0 89.4 90.4 90.4  PLT 235 241 243 280 449   Basic Metabolic Panel:  Recent Labs Lab 06/14/16 0347 06/15/16 0520  06/16/16 0433  06/17/16 0404 06/17/16 1554 06/18/16 0500 06/18/16 2058 06/19/16 0500 06/20/16 0450  NA 146* 150*  < > 141  < > 142 146* 147* 144 142 136  K 3.8 3.9  < > 3.7  < > 4.3 4.7 4.6 4.4 4.2 4.1  CL 99* 102  < > 100*  < > 100* 100* 104 101 102 99*  CO2 29 29  < > 28  < > 28 29 29 29 30 28   GLUCOSE 154* 152*  < > 143*  < > 181* 158* 155* 176* 147* 141*  BUN 132* 160*  < > 152*  < > 151* 153* 159* 155* 160* 118*  CREATININE 3.78* 3.73*  < > 3.42*  < > 3.02* 2.92* 2.88* 2.50* 2.25* 1.79*  CALCIUM 9.9  9.9  < > 9.2  < > 9.7 10.2 10.0 9.8 9.6 9.3  MG 2.1 2.3  --   --   --   --   --   --   --   --   --   PHOS 6.0* 5.4*  5.3*  --  5.9*  --  5.2*  --  5.0*  --  4.1 3.6  < > = values in this interval not displayed. GFR: Estimated Creatinine Clearance: 50 mL/min (A) (by C-G formula based on SCr of 1.79 mg/dL (H)).  Liver Function Tests:  Recent Labs Lab 06/16/16 0433 06/17/16 0404 06/18/16 0500 06/19/16 0500 06/20/16 0450  ALBUMIN 2.4* 2.4* 2.5* 2.4* 2.5*    Cardiac Enzymes:  Recent Labs Lab 06/16/16 0500  CKTOTAL 112    HbA1C: Hgb A1c MFr Bld  Date/Time Value Ref Range Status  06/28/2012 02:45 PM 6.3 (H) <5.7 % Final    Comment:    (NOTE)                                                                       According to the ADA Clinical Practice Recommendations for 2011, when HbA1c is used as a screening test:  >=6.5%   Diagnostic of Diabetes Mellitus           (if abnormal result is confirmed) 5.7-6.4%   Increased risk of developing Diabetes Mellitus References:Diagnosis and Classification of Diabetes Mellitus,Diabetes Care,2011,34(Suppl 1):S62-S69 and Standards of Medical Care in         Diabetes - 2011,Diabetes EEFE,0712,19 (Suppl 1):S11-S61.  10/27/2006 03:00 PM (H)  Final   6.2 (NOTE)   The ADA recommends the following therapeutic goals for glycemic   control related to Hgb A1C measurement:   Goal of Therapy:   < 7.0% Hgb A1C   Action Suggested:  > 8.0% Hgb A1C   Ref:  Diabetes Care, 22, Suppl. 1, 1999    CBG:  Recent Labs Lab 06/19/16 1643 06/19/16 1943 06/20/16 0006 06/20/16 0352 06/20/16 0805  GLUCAP 176* 128* 134* 122* 135*    Recent Results (from the past 240 hour(s))  Culture, respiratory (NON-Expectorated)     Status: None   Collection Time: 06/13/16  8:35 PM  Result Value Ref Range Status   Specimen Description TRACHEAL ASPIRATE  Final   Special Requests NONE  Final   Gram Stain   Final    FEW WBC PRESENT,BOTH PMN AND MONONUCLEAR NO  ORGANISMS SEEN    Culture Consistent with normal respiratory flora.  Final   Report Status 06/16/2016 FINAL  Final     Scheduled Meds: . carvedilol  6.25 mg Oral BID WC  . cefTRIAXone (ROCEPHIN)  IV  2 g Intravenous Q24H  . chlorhexidine  15 mL Mouth Rinse BID  . Chlorhexidine Gluconate Cloth  6 each Topical Daily  . colchicine  0.6 mg Oral Daily  . free water  150 mL Per Tube Q6H  . heparin  5,000 Units Subcutaneous Q8H  . hydrALAZINE  10 mg Oral Q8H  . insulin aspart  0-9 Units Subcutaneous Q4H  . isosorbide dinitrate  5 mg Oral BID  . mouth rinse  15 mL Mouth Rinse q12n4p  . pantoprazole sodium  40 mg Per Tube Daily  . pneumococcal 23 valent vaccine  0.5 mL Intramuscular Tomorrow-1000  . sodium chloride flush  10-40 mL Intracatheter Q12H  . tobramycin-dexamethasone   Right Eye BID   Continuous Infusions: . sodium chloride 10 mL/hr at 06/19/16 2000  . sodium chloride 10 mL/hr at 06/10/16 1203  . dextrose 100 mL/hr at 06/20/16 0420  . feeding supplement (NEPRO CARB STEADY) 1,000 mL (06/20/16 0816)     LOS: 14 days   Cherene Altes, MD [facility] Office  [phone] Pager - Text Page per Amion as per below:  On-Call/Text Page:      [email or web address]      password [email or web address]  If 7PM-7AM, please contact night-coverage [email or web address] Password [facility] 06/20/2016, 8:18 AM

## 2016-06-20 NOTE — Progress Notes (Signed)
[facility] Progress Note    Assessment/ Plan:   1 CKD 3/AKI w/ severe azotemia(hypovolemia, protein loading and recent steroids)slow recovery and making urine-- much improved today, will decrease IVF to 50/ hr.  IF FEES favorable today rec d/cing all IVF and allowing pt to drink to thirst. 2 VDRF vol, paralyzed diaphragm 3 HTN 4 obesity 5 Hx of paraganglioma 6 CAD 7 CM with EF 15% 8 Hypernatremia, recurrent--improving 9 Bacteremia, strep viridans 10 Hypovolemia, improving  Subjective:    Remains extubated, feeling better.  For FEES today.   Objective:   BP 105/69   Pulse 82   Temp 97.4 F (36.3 C) (Oral)   Resp (!) 27   Ht 5\' 6"  (1.676 m) Comment: measure per RRT  Wt 98.4 kg (216 lb 14.9 oz)   SpO2 96%   BMI 35.01 kg/m   Intake/Output Summary (Last 24 hours) at 06/20/16 0817 Last data filed at 06/20/16 0800  Gross per 24 hour  Intake             4136 ml  Output             1825 ml  Net             2311 ml   Weight change:   Physical Exam: Gen: extubated, smiling  CVS: RRR, III/VI systolic murmur with rumbling diastolic component Resp: nonlabored XIP:[id number] Ext: no LE edema  Imaging: Dg Chest Port 1 View  Result Date: 06/18/2016 CLINICAL DATA:  Extubated. EXAM: PORTABLE CHEST 1 VIEW COMPARISON:  06/17/2016 FINDINGS: Left subclavian AICD device and leads are stable. Endotracheal tube removed. Feeding tube stable. Right jugular central venous catheter stable. Low volumes. Hazy airspace disease bilaterally and low lung volumes have worsened. Right-sided rib deformities are noted and unchanged. No pneumothorax. IMPRESSION: Increasing bilateral central and basilar opacity likely due to stable airspace disease but worsening volume loss. Electronically Signed   By: Marybelle Killings M.D.   On: 06/18/2016 13:48    Labs: BMET  Recent Labs Lab 06/14/16 0347 06/15/16 0520  06/16/16 0433  06/17/16 0402 06/17/16 0404 06/17/16 1554 06/18/16 0500  06/18/16 2058 06/19/16 0500 06/20/16 0450  NA 146* 150*  < > 141  < > 141 142 146* 147* 144 142 136  K 3.8 3.9  < > 3.7  < > 4.3 4.3 4.7 4.6 4.4 4.2 4.1  CL 99* 102  < > 100*  < > 98* 100* 100* 104 101 102 99*  CO2 29 29  < > 28  < > 28 28 29 29 29 30 28   GLUCOSE 154* 152*  < > 143*  < > 180* 181* 158* 155* 176* 147* 141*  BUN 132* 160*  < > 152*  < > 154* 151* 153* 159* 155* 160* 118*  CREATININE 3.78* 3.73*  < > 3.42*  < > 3.01* 3.02* 2.92* 2.88* 2.50* 2.25* 1.79*  CALCIUM 9.9 9.9  < > 9.2  < > 9.6 9.7 10.2 10.0 9.8 9.6 9.3  PHOS 6.0* 5.4*  5.3*  --  5.9*  --   --  5.2*  --  5.0*  --  4.1 3.6  < > = values in this interval not displayed. CBC  Recent Labs Lab 06/16/16 0433 06/17/16 0402 06/18/16 0500 06/19/16 0500  WBC 15.0* 14.9* 19.4* 18.9*  HGB 12.5* 11.8* 12.5* 12.2*  HCT 38.6* 36.3* 38.6* 37.8*  MCV 90.0 89.4 90.4 90.4  PLT 241 243 280 296    Medications:    .  carvedilol  6.25 mg Oral BID WC  . cefTRIAXone (ROCEPHIN)  IV  2 g Intravenous Q24H  . chlorhexidine  15 mL Mouth Rinse BID  . Chlorhexidine Gluconate Cloth  6 each Topical Daily  . colchicine  0.6 mg Oral Daily  . free water  150 mL Per Tube Q6H  . heparin  5,000 Units Subcutaneous Q8H  . hydrALAZINE  10 mg Oral Q8H  . insulin aspart  0-9 Units Subcutaneous Q4H  . isosorbide dinitrate  5 mg Oral BID  . mouth rinse  15 mL Mouth Rinse q12n4p  . pantoprazole sodium  40 mg Per Tube Daily  . pneumococcal 23 valent vaccine  0.5 mL Intramuscular Tomorrow-1000  . sodium chloride flush  10-40 mL Intracatheter Q12H  . tobramycin-dexamethasone   Right Eye BID      Madelon Lips, MD 06/20/2016, 8:17 AM

## 2016-06-20 NOTE — Progress Notes (Signed)
Advanced Heart Failure Rounding Note   Subjective:    Admitted 06/06/16 with acute respiratory failure requiring intubation. Felt to be secondary to volume overload and septic shock. 1/2 blood cultures positive for strep viridans.   Extubated 06/18/16.   Creatinine improving 2.5->2.25->1.79. Making urine.   Feels good today, no SOB. Got to the chair yesterday. Working with PT.   Studies TEE 06/15/16 with LVEF 15%, No thrombus noted, RV mildly decreased in function, mild/mod LAE, moderate to severe MR with some restriction of posterior leaflet.  No endocarditis noted.  Objective:   Weight Range:  Vital Signs:   Temp:  [97.1 F (36.2 C)-97.9 F (36.6 C)] 97.4 F (36.3 C) (04/16 0355) Pulse Rate:  [75-86] 83 (04/16 0700) Resp:  [18-60] 20 (04/16 0700) BP: (102-127)/(69-95) 105/69 (04/16 0700) SpO2:  [95 %-100 %] 98 % (04/16 0700) Last BM Date: 06/19/16  Weight change: Filed Weights   06/16/16 0426 06/17/16 0349 06/18/16 0500  Weight: 214 lb 15.2 oz (97.5 kg) 224 lb 3.3 oz (101.7 kg) 216 lb 14.9 oz (98.4 kg)    Intake/Output:   Intake/Output Summary (Last 24 hours) at 06/20/16 0743 Last data filed at 06/20/16 0500  Gross per 24 hour  Intake             3796 ml  Output             1975 ml  Net             1821 ml     Physical Exam: General: Male, NAD. Lying in bed.   HEENT: R eye opaque. Otherwise normal Neck: Supple. LIJ central line. JVP 7.   Cor: PMI laterally displaced. Regular rate and rhythm. 2/6 systolic murmur.  Lungs: Clear in upper lobes. Diminished in bases.  Abdomen: Obese, soft, NT, ND, no HSM. No bruits or masses. +BS.  Extremities: No cyanosis, clubbing, rash. No pedal edema  Neuro: Alert & oriented x 3. Cranial nerves grossly intact. Moves all 4 extremities w/o difficulty. Affect flat but appropriate.   Telemetry: NSR/Sinus brady rates in the 60's.   Labs: Basic Metabolic Panel:  Recent Labs Lab 06/14/16 0347 06/15/16 0520  06/16/16 0433   06/17/16 0404 06/17/16 1554 06/18/16 0500 06/18/16 2058 06/19/16 0500 06/20/16 0450  NA 146* 150*  < > 141  < > 142 146* 147* 144 142 136  K 3.8 3.9  < > 3.7  < > 4.3 4.7 4.6 4.4 4.2 4.1  CL 99* 102  < > 100*  < > 100* 100* 104 101 102 99*  CO2 29 29  < > 28  < > 28 29 29 29 30 28   GLUCOSE 154* 152*  < > 143*  < > 181* 158* 155* 176* 147* 141*  BUN 132* 160*  < > 152*  < > 151* 153* 159* 155* 160* 118*  CREATININE 3.78* 3.73*  < > 3.42*  < > 3.02* 2.92* 2.88* 2.50* 2.25* 1.79*  CALCIUM 9.9 9.9  < > 9.2  < > 9.7 10.2 10.0 9.8 9.6 9.3  MG 2.1 2.3  --   --   --   --   --   --   --   --   --   PHOS 6.0* 5.4*  5.3*  --  5.9*  --  5.2*  --  5.0*  --  4.1 3.6  < > = values in this interval not displayed.  Liver Function Tests:  Recent Labs Lab 06/16/16  3557 06/17/16 0404 06/18/16 0500 06/19/16 0500 06/20/16 0450  ALBUMIN 2.4* 2.4* 2.5* 2.4* 2.5*    CBC:  Recent Labs Lab 06/15/16 0520 06/16/16 0433 06/17/16 0402 06/18/16 0500 06/19/16 0500  WBC 15.8* 15.0* 14.9* 19.4* 18.9*  HGB 13.8 12.5* 11.8* 12.5* 12.2*  HCT 41.9 38.6* 36.3* 38.6* 37.8*  MCV 89.3 90.0 89.4 90.4 90.4  PLT 235 241 243 280 296     BNP: BNP (last 3 results)  Recent Labs  06/06/16 1645 06/06/16 1925  BNP 796.0* 553.0*      Transthoracic Echocardiography 06/10/16 Study Conclusions  - Left ventricle: The cavity size was severely dilated. Wall   thickness was normal. The estimated ejection fraction was 20%.   Diffuse hypokinesis. - Aortic valve: There was mild regurgitation. - Mitral valve: There was moderate regurgitation. - Left atrium: The atrium was moderately dilated. - Atrial septum: No defect or patent foramen ovale was identified.   Medications:     Scheduled Medications: . carvedilol  6.25 mg Oral BID WC  . cefTRIAXone (ROCEPHIN)  IV  2 g Intravenous Q24H  . chlorhexidine  15 mL Mouth Rinse BID  . Chlorhexidine Gluconate Cloth  6 each Topical Daily  . colchicine  0.6 mg  Oral Daily  . free water  150 mL Per Tube Q6H  . heparin  5,000 Units Subcutaneous Q8H  . hydrALAZINE  10 mg Oral Q8H  . insulin aspart  0-9 Units Subcutaneous Q4H  . isosorbide dinitrate  5 mg Oral BID  . mouth rinse  15 mL Mouth Rinse q12n4p  . pantoprazole sodium  40 mg Per Tube Daily  . pneumococcal 23 valent vaccine  0.5 mL Intramuscular Tomorrow-1000  . sodium chloride flush  10-40 mL Intracatheter Q12H  . tobramycin-dexamethasone   Right Eye BID    Infusions: . sodium chloride 10 mL/hr at 06/19/16 2000  . sodium chloride 10 mL/hr at 06/10/16 1203  . dextrose 100 mL/hr at 06/20/16 0420  . feeding supplement (NEPRO CARB STEADY) 1,000 mL (06/19/16 2000)    PRN Medications: fentaNYL (SUBLIMAZE) injection, guaiFENesin, sodium chloride, sodium chloride flush   Assessment/Plan   1. Septic shock: treated for CAP.  - Remains on ceftriaxone for bronchitis/PNA.  - Strep viridans 1/2 on initial cultures. Follow up cultures negative.  - ID following - As above, TEE negative for endocarditis or infected ICD.  2. Acute hypoxic respiratory failure - Extubated. Still short of breath at times. Asking about diaphragm plication. 3. Acute on chronic renal failure - Renal following.  - Creatinine improving.  4. Chronic systolic HF EF 32% on echo 06/09/16 - TEE 06/15/16 with no appreciated endocarditis. EF 20% - CVP 7  - Continue coreg 6.25 mg BID, will not increase as his rates are in the 60's. - Continue hydralazine 10 mg TID - Continue Isordil 5 mg BID ( able to be crushed for cortrak).  - No ACE/ARB with CKD.  - BP stable. 5. Sinus tachycardia - Resolved, now with sinus bradycardia.  6. Paralyzed right hemidiaphragm  - Stable. Patient is extubated.  7. h/o paraganglioma resection  Length of Stay: [place], NP  06/20/2016, 7:43 AM Advanced Heart Failure Team  Pager [phone] M-F 7am-4pm.  Please contact [facility] for night-coverage after hours (4p -7a ) and  weekends on [email or web address]  Patient seen and examined with Jettie Booze, NP. We discussed all aspects of the encounter. I agree with the assessment and plan as stated above.   Overall improved  but respiratory status remains very tenuous with paralyzed hemidiaphragm. Will give flutter valve. I told him that he is currently too ill to consider diaphragmatic plication currently.   HF stable. Volume status an co-ox look good. HR much improved. May need to cut carvedilol back a bit.   Renal function improving. Be careful with diuresis.   TEE without evidence of endocarditis.   Glori Bickers, MD  9:35 PM

## 2016-06-21 LAB — RENAL FUNCTION PANEL
Albumin: 2.6 g/dL — ABNORMAL LOW (ref 3.5–5.0)
Anion gap: 9 (ref 5–15)
BUN: 110 mg/dL — ABNORMAL HIGH (ref 6–20)
CHLORIDE: 96 mmol/L — AB (ref 101–111)
CO2: 27 mmol/L (ref 22–32)
CREATININE: 2.1 mg/dL — AB (ref 0.61–1.24)
Calcium: 9.4 mg/dL (ref 8.9–10.3)
GFR calc non Af Amer: 33 mL/min — ABNORMAL LOW (ref >60)
GFR, EST AFRICAN AMERICAN: 39 mL/min — AB (ref >60)
Glucose, Bld: 93 mg/dL (ref 65–99)
Phosphorus: 4.4 mg/dL (ref 2.5–4.6)
Potassium: 4.8 mmol/L (ref 3.5–5.1)
Sodium: 132 mmol/L — ABNORMAL LOW (ref 135–145)

## 2016-06-21 LAB — CBC
HEMATOCRIT: 38.5 % — AB (ref 39.0–52.0)
Hemoglobin: 12.9 g/dL — ABNORMAL LOW (ref 13.0–17.0)
MCH: 29.7 pg (ref 26.0–34.0)
MCHC: 33.5 g/dL (ref 30.0–36.0)
MCV: 88.5 fL (ref 78.0–100.0)
Platelets: 330 10*3/uL (ref 150–400)
RBC: 4.35 MIL/uL (ref 4.22–5.81)
RDW: 14.3 % (ref 11.5–15.5)
WBC: 13.5 10*3/uL — AB (ref 4.0–10.5)

## 2016-06-21 LAB — GLUCOSE, CAPILLARY
GLUCOSE-CAPILLARY: 89 mg/dL (ref 65–99)
GLUCOSE-CAPILLARY: 94 mg/dL (ref 65–99)
GLUCOSE-CAPILLARY: 105 mg/dL — AB (ref 65–99)
Glucose-Capillary: 112 mg/dL — ABNORMAL HIGH (ref 65–99)
Glucose-Capillary: 145 mg/dL — ABNORMAL HIGH (ref 65–99)

## 2016-06-21 MED ORDER — SODIUM CHLORIDE 0.9 % IV SOLN
INTRAVENOUS | Status: DC
  Administered 2016-06-21: 10 mL/h via INTRAVENOUS

## 2016-06-21 MED ORDER — CARVEDILOL 3.125 MG PO TABS
3.1250 mg | ORAL_TABLET | Freq: Two times a day (BID) | ORAL | Status: DC
  Administered 2016-06-22 – 2016-06-24 (×4): 3.125 mg via ORAL
  Filled 2016-06-21 (×7): qty 1

## 2016-06-21 NOTE — Progress Notes (Signed)
[place] TEAM 1 - Stepdown/ICU TEAM  [name]  QHU:[id number] DOB: [birth year] DOA: 06/06/2016 PCP: Gennette Pac, MD    Brief Narrative:  45F w/ Hx of CHF, hemidiaphragm s/p paraganglioma resection,CKD, CAD s/p BiV ICD, and hypothyroidism who presentedw/ SOB and diaphoresis.  He was initially tx for a CHF exacerbation w/ bipap, IV lasix, and IV nitro.  Nonetheless he developed severe tachypnea and diaphoresis requiring intubation 4/2. He developed a temp of 104.60F while still in the ED, and blood cultures grew strep viridans in 1 of 2 bottles.  Significant Events: 4/2 admit - intubated 4/5 TTE - EF 20% 4/6 renal US - no hydro - 1.7cm L kidney cyst 4/10 TEE mod/severe mitral regurg - no vegetations 4/15 extubated  Subjective: Resting comfortably in bed.  Denies cp, sob, n/v, or abdom pain.  Is awake and conversant.    Assessment & Plan:  1. Acute hypoxic respiratory failure due to pulmonary edema   -Now requiring only 2L [place] - wean O2 as able   2. Acute exacerbation of Chronic systolic HF EF 68% on TTE 06/09/16  -CHF Team following - net positive ~4L - baseline dry wgt appears to   be ~102kg (but is quite variable)  3. Acute renal failure on CKD stage 3 - marked azotemia   -Baseline crt ~1.9 - renal fxn has returned to baseline  -BUN still up, but stable  4. Strep viridans bacteremia   -14 days IV rocephin per ID (will check to see if able to change     To po at all...)  5. Paralyzed right hemidiaphragm s/p paraganglioma resection  -no changes  6. HTN  -Presently well controlled   CAD  Obesity - Body mass index is 35.41 kg/m.   DVT prophylaxis: heparin  Code Status: FULL CODE Family Communication: no family present at time of exam  Disposition Plan: transfer to SDU   Consultants:  PCCM EP ID Nephrology  CHF Team  Antimicrobials:  Vancomycin 4/2 > 4/3  Zosyn 4/2 > 4/5  Azithromycin 4/5 > 4/9  Rocephin 4/5 > stop on 06/23/17  Objective: Blood  pressure 108/74, pulse 87, temperature 97.4 F (36.3 C), temperature source Oral, resp. rate (!) 26, height 5\' 6"  (1.676 m), weight 99.5 kg (219 lb 5.7 oz), SpO2 98 %.  Intake/Output Summary (Last 24 hours) at 06/21/16 1315 Last data filed at 06/21/16 1100  Gross per 24 hour  Intake             1010 ml  Output             1125 ml  Net             -115 ml   Filed Weights   06/17/16 0349 06/18/16 0500 06/21/16 0500  Weight: 101.7 kg (224 lb 3.3 oz) 98.4 kg (216 lb 14.9 oz) 99.5 kg (219 lb 5.7 oz)    Examination: General: No acute respiratory distress, WDWN Neck: soft, not tender, thyroid normal size Lungs: Clear to auscultation bilaterally without wheezes or crackles - poor air movement R base  Cardiovascular: Regular rate and rhythm without M/R/G Abdomen: Nontender, protuberent, soft, bowel sounds positive, no rebound, no ascites, no appreciable mass Extremities: No significant cyanosis, clubbing, or edema bilateral lower extremities  CCBG:  Recent Labs Lab 06/20/16 2031 06/20/16 2343 06/21/16 0410 06/21/16 0756 06/21/16 1153  GLUCAP 103* 95 89 94 112*    Recent Results (from the past 240 hour(s))  Culture, respiratory (NON-Expectorated)  Status: None   Collection Time: 06/13/16  8:35 PM  Result Value Ref Range Status   Specimen Description TRACHEAL ASPIRATE  Final   Special Requests NONE  Final   Gram Stain   Final    FEW WBC PRESENT,BOTH PMN AND MONONUCLEAR NO ORGANISMS SEEN    Culture Consistent with normal respiratory flora.  Final   Report Status 06/16/2016 FINAL  Final     Scheduled Meds: . carvedilol  6.25 mg Oral BID WC  . Chlorhexidine Gluconate Cloth  6 each Topical Daily  . colchicine  0.6 mg Oral Daily  . heparin  5,000 Units Subcutaneous Q8H  . hydrALAZINE  10 mg Oral Q8H  . insulin aspart  0-9 Units Subcutaneous Q4H  . isosorbide dinitrate  5 mg Oral BID  . mouth rinse  15 mL Mouth Rinse BID  . pantoprazole  40 mg Oral Q1200  . sodium  chloride flush  10-40 mL Intracatheter Q12H  . tobramycin-dexamethasone   Right Eye BID   Continuous Infusions: . sodium chloride 10 mL/hr (06/21/16 1037)  . cefTRIAXone (ROCEPHIN)  IV       LOS: 15 days

## 2016-06-21 NOTE — Progress Notes (Signed)
[facility] Progress Note    Assessment/ Plan:   1 CKD 3/AKI w/ severe azotemia(hypovolemia, protein loading and recent steroids)- have stopped IVF.  Creatinine is essentially back to baseline.  Hopefully given favorable FEES he will be able to eat and drink normally.  Since he is much improved we will sign off at this time.  Please don't hesitate to contact us with any questions or concerns. 2 VDRF- resolved 3 HTN 4 obesity 5 Hx of paraganglioma 6 CAD 7 CM with EF 15%- heart failure following 8 Hypernatremia, recurrent--resolved 9 Bacteremia, strep viridans 10 Hypovolemia, resolved.    Subjective:    A little more SOB.   Objective:   BP 103/78   Pulse 88   Temp 97.4 F (36.3 C) (Oral)   Resp (!) 33   Ht 5\' 6"  (1.676 m) Comment: measure per RRT  Wt 99.5 kg (219 lb 5.7 oz)   SpO2 97%   BMI 35.41 kg/m   Intake/Output Summary (Last 24 hours) at 06/21/16 1352 Last data filed at 06/21/16 1338  Gross per 24 hour  Intake             1450 ml  Output             1325 ml  Net              125 ml   Weight change:   Physical Exam: Gen: extubated, smiling  CVS: RRR, III/VI systolic murmur with rumbling diastolic component Resp: nonlabored [id number] Ext: no LE edema  Imaging: Dg Chest Port 1 View  Result Date: 06/20/2016 CLINICAL DATA:  Sudden SOB with right rib pain tonight, r/o pneumothorax. EXAM: PORTABLE CHEST 1 VIEW COMPARISON:  Chest x-rays dated 06/18/2016 and 06/08/2016. FINDINGS: Cardiomegaly appears stable. Again noted is central pulmonary vascular congestion without overt alveolar pulmonary edema. No new lung findings. No large pleural effusion. No pneumothorax seen. Left chest wall pacemaker in place. Right IJ central line appears stable in position with tip at the level of the mid SVC. Old healed fractures within the right ribs. No acute fracture or dislocation seen. IMPRESSION: 1. No acute findings.  No pneumothorax seen. 2. Stable cardiomegaly with  central pulmonary vascular congestion suggesting mild CHF/volume overload. No evidence of overt alveolar pulmonary edema. No evidence of pneumonia or large pleural effusion. Electronically Signed   By: Franki Cabot M.D.   On: 06/20/2016 18:41    Labs: BMET  Recent Labs Lab 06/15/16 0520  06/16/16 0433  06/17/16 0404 06/17/16 1554 06/18/16 0500 06/18/16 2058 06/19/16 0500 06/20/16 0450 06/21/16 0300  NA 150*  < > 141  < > 142 146* 147* 144 142 136 132*  K 3.9  < > 3.7  < > 4.3 4.7 4.6 4.4 4.2 4.1 4.8  CL 102  < > 100*  < > 100* 100* 104 101 102 99* 96*  CO2 29  < > 28  < > 28 29 29 29 30 28 27   GLUCOSE 152*  < > 143*  < > 181* 158* 155* 176* 147* 141* 93  BUN 160*  < > 152*  < > 151* 153* 159* 155* 160* 118* 110*  CREATININE 3.73*  < > 3.42*  < > 3.02* 2.92* 2.88* 2.50* 2.25* 1.79* 2.10*  CALCIUM 9.9  < > 9.2  < > 9.7 10.2 10.0 9.8 9.6 9.3 9.4  PHOS 5.4*  5.3*  --  5.9*  --  5.2*  --  5.0*  --  4.1  3.6 4.4  < > = values in this interval not displayed. CBC  Recent Labs Lab 06/17/16 0402 06/18/16 0500 06/19/16 0500 06/21/16 0300  WBC 14.9* 19.4* 18.9* 13.5*  HGB 11.8* 12.5* 12.2* 12.9*  HCT 36.3* 38.6* 37.8* 38.5*  MCV 89.4 90.4 90.4 88.5  PLT 243 280 296 330    Medications:    . carvedilol  6.25 mg Oral BID WC  . Chlorhexidine Gluconate Cloth  6 each Topical Daily  . colchicine  0.6 mg Oral Daily  . heparin  5,000 Units Subcutaneous Q8H  . hydrALAZINE  10 mg Oral Q8H  . insulin aspart  0-9 Units Subcutaneous Q4H  . isosorbide dinitrate  5 mg Oral BID  . mouth rinse  15 mL Mouth Rinse BID  . pantoprazole  40 mg Oral Q1200  . sodium chloride flush  10-40 mL Intracatheter Q12H  . tobramycin-dexamethasone   Right Eye BID      Madelon Lips, MD 06/21/2016, 1:52 PM

## 2016-06-21 NOTE — Progress Notes (Signed)
Advanced Heart Failure Rounding Note   Primary Cardiologist: Dr. Haroldine Laws  Subjective:    Admitted 06/06/16 with acute respiratory failure requiring intubation secondary to volume overload and septic shock. 1/2 blood cultures positive for strep viridans.   Extubated 06/18/16. Creatinine 2.88->2.50->2.25->1.79->2.10. Weight up 3 pounds from yesterday.  Doing well, had some acute SOB yesterday. Chest X ray with mild vascular congestion. Has not gotten any diuretics since 4/13. Feels well today, has gotten up and walked. FEES test yesterday - can have regular diet. CVP 4.   Studies TEE 06/15/16 with LVEF 15%, No thrombus noted, RV mildly decreased in function, mild/mod LAE, moderate to severe MR with some restriction of posterior leaflet.  No endocarditis noted.   Objective:   Weight Range: 219 lb 5.7 oz (99.5 kg) Body mass index is 35.41 kg/m.   Vital Signs:   Temp:  [97.2 F (36.2 C)-97.6 F (36.4 C)] 97.4 F (36.3 C) (04/17 0411) Pulse Rate:  [77-99] 89 (04/17 0700) Resp:  [21-41] 30 (04/17 0700) BP: (94-125)/(70-99) 125/99 (04/17 0700) SpO2:  [94 %-100 %] 96 % (04/17 0700) Weight:  [219 lb 5.7 oz (99.5 kg)] 219 lb 5.7 oz (99.5 kg) (04/17 0500) Last BM Date: 06/20/16  Weight change: Filed Weights   06/17/16 0349 06/18/16 0500 06/21/16 0500  Weight: 224 lb 3.3 oz (101.7 kg) 216 lb 14.9 oz (98.4 kg) 219 lb 5.7 oz (99.5 kg)    Intake/Output:   Intake/Output Summary (Last 24 hours) at 06/21/16 0746 Last data filed at 06/21/16 0700  Gross per 24 hour  Intake           1827.5 ml  Output             2200 ml  Net           -372.5 ml     Physical Exam: General:  Male, NAD.  HEENT: normal, atraumatic  Neck: supple. no JVP . Carotids 2+ bilat; no bruits. No lymphadenopathy or thyromegaly appreciated. Cor: PMI nondisplaced. Regular rate & rhythm. No rubs, gallops. 2/6 systolic murmur.  Lungs: Clear in upper lobes, diminished in bases.  Abdomen: obese soft, nontender,  nondistended. No hepatosplenomegaly. No bruits or masses. Good bowel sounds. Extremities: no cyanosis, clubbing, rash, edema Neuro: alert & orientedx3, cranial nerves grossly intact. moves all 4 extremities w/o difficulty. Affect pleasant   Telemetry: NSR, rates in the 80's.   Labs: CBC  Recent Labs  06/19/16 0500 06/21/16 0300  WBC 18.9* 13.5*  HGB 12.2* 12.9*  HCT 37.8* 38.5*  MCV 90.4 88.5  PLT 296 027   Basic Metabolic Panel  Recent Labs  06/20/16 0450 06/21/16 0300  NA 136 132*  K 4.1 4.8  CL 99* 96*  CO2 28 27  GLUCOSE 141* 93  BUN 118* 110*  CREATININE 1.79* 2.10*  CALCIUM 9.3 9.4  PHOS 3.6 4.4   Liver Function Tests  Recent Labs  06/20/16 0450 06/21/16 0300  ALBUMIN 2.5* 2.6*    BNP: BNP (last 3 results)  Recent Labs  06/06/16 1645 06/06/16 1925  BNP 796.0* 553.0*   Transthoracic Echocardiography 06/09/16 Study Conclusions  - Left ventricle: The cavity size was severely dilated. Wall   thickness was normal. The estimated ejection fraction was 20%.   Diffuse hypokinesis. - Aortic valve: There was mild regurgitation. - Mitral valve: There was moderate regurgitation. - Left atrium: The atrium was moderately dilated. - Atrial septum: No defect or patent foramen ovale was identified.  Imaging/Studies:  Dg Chest  Port 1 View  Result Date: 06/20/2016 CLINICAL DATA:  Sudden SOB with right rib pain tonight, r/o pneumothorax. EXAM: PORTABLE CHEST 1 VIEW COMPARISON:  Chest x-rays dated 06/18/2016 and 06/08/2016. FINDINGS: Cardiomegaly appears stable. Again noted is central pulmonary vascular congestion without overt alveolar pulmonary edema. No new lung findings. No large pleural effusion. No pneumothorax seen. Left chest wall pacemaker in place. Right IJ central line appears stable in position with tip at the level of the mid SVC. Old healed fractures within the right ribs. No acute fracture or dislocation seen. IMPRESSION: 1. No acute findings.  No  pneumothorax seen. 2. Stable cardiomegaly with central pulmonary vascular congestion suggesting mild CHF/volume overload. No evidence of overt alveolar pulmonary edema. No evidence of pneumonia or large pleural effusion. Electronically Signed   By: Franki Cabot M.D.   On: 06/20/2016 18:41       Medications:     Scheduled Medications: . carvedilol  6.25 mg Oral BID WC  . cefTRIAXone (ROCEPHIN)  IV  2 g Intravenous Q24H  . Chlorhexidine Gluconate Cloth  6 each Topical Daily  . colchicine  0.6 mg Oral Daily  . heparin  5,000 Units Subcutaneous Q8H  . hydrALAZINE  10 mg Oral Q8H  . insulin aspart  0-9 Units Subcutaneous Q4H  . isosorbide dinitrate  5 mg Oral BID  . mouth rinse  15 mL Mouth Rinse BID  . pantoprazole  40 mg Oral Q1200  . sodium chloride flush  10-40 mL Intracatheter Q12H  . tobramycin-dexamethasone   Right Eye BID     Infusions: . dextrose 50 mL/hr at 06/20/16 2009     PRN Medications:  ALPRAZolam, fentaNYL (SUBLIMAZE) injection, guaiFENesin, sodium chloride, sodium chloride flush   Assessment/Plan   1. Septic shock: CAP  - Remains on ceftriaxone - Strep viridans 1/2 blood cultures, follow up cultures negative.  - ID following.  - TEE negative for endocarditis or infected ICD.  2. Acute hypoxic respiratory failure:  - Extubated, had some SOB yesterday.  - Asked about diaphragm plication yesterday, too ill to consider this for now.  3. Acute on chronic renal failure stage III - Creatinine slightly increased today. Baseline creatinine about 1.7-1.8 - IVF decreased to 50 ml/hr yesterday. Would stop today.  - Renal following.  4. Chronic systolic CHF: EF 84% on 03/12/58 - Volume status stable, CVP 4. Does not appear volume overloaded on exam.  - Continue Coreg 6.25mg  BID - Continue hydralazine 10mg  TID.  - Continue isordil 5mg  BID - No ACE/ARB with CKD - BP stable, soft at times.  5. Paralyzed right hemidiaphragm - Stable.  6. History of paraganglioma  resection  Length of Stay: [place], NP  06/21/2016, 7:46 AM Advanced Heart Failure Team  Pager [phone] M-F 7am-4pm.  Please contact [facility] for night-coverage after hours (4p -7a ) and weekends on [email or web address]  Patient seen and examined with Jettie Booze, NP. We discussed all aspects of the encounter. I agree with the assessment and plan as stated above.   Patient seen and examined with Jettie Booze, NP. We discussed all aspects of the encounter. I agree with the assessment and plan as stated above.   He remains stable from HF standpoint. CVP 4. Has passed FEES so will d/c IVF. Continue current HF meds. Would not add ACE/ARB with AKI. Continue to mobilize. Continue flutter valve.   Glori Bickers, MD  11:06 AM

## 2016-06-22 DIAGNOSIS — I2510 Atherosclerotic heart disease of native coronary artery without angina pectoris: Secondary | ICD-10-CM

## 2016-06-22 DIAGNOSIS — D62 Acute posthemorrhagic anemia: Secondary | ICD-10-CM

## 2016-06-22 DIAGNOSIS — J189 Pneumonia, unspecified organism: Secondary | ICD-10-CM

## 2016-06-22 DIAGNOSIS — N183 Chronic kidney disease, stage 3 (moderate): Secondary | ICD-10-CM

## 2016-06-22 DIAGNOSIS — I5043 Acute on chronic combined systolic (congestive) and diastolic (congestive) heart failure: Secondary | ICD-10-CM

## 2016-06-22 DIAGNOSIS — D72829 Elevated white blood cell count, unspecified: Secondary | ICD-10-CM

## 2016-06-22 DIAGNOSIS — R0682 Tachypnea, not elsewhere classified: Secondary | ICD-10-CM

## 2016-06-22 LAB — RENAL FUNCTION PANEL
ALBUMIN: 2.8 g/dL — AB (ref 3.5–5.0)
Anion gap: 10 (ref 5–15)
BUN: 91 mg/dL — AB (ref 6–20)
CALCIUM: 9.3 mg/dL (ref 8.9–10.3)
CO2: 25 mmol/L (ref 22–32)
Chloride: 96 mmol/L — ABNORMAL LOW (ref 101–111)
Creatinine, Ser: 2.03 mg/dL — ABNORMAL HIGH (ref 0.61–1.24)
GFR calc Af Amer: 40 mL/min — ABNORMAL LOW (ref >60)
GFR calc non Af Amer: 35 mL/min — ABNORMAL LOW (ref >60)
GLUCOSE: 94 mg/dL (ref 65–99)
PHOSPHORUS: 4.4 mg/dL (ref 2.5–4.6)
Potassium: 4.6 mmol/L (ref 3.5–5.1)
SODIUM: 131 mmol/L — AB (ref 135–145)

## 2016-06-22 LAB — CBC WITH DIFFERENTIAL/PLATELET
Basophils Absolute: 0 10*3/uL (ref 0.0–0.1)
Basophils Relative: 0 %
Eosinophils Absolute: 0.4 10*3/uL (ref 0.0–0.7)
Eosinophils Relative: 4 %
HCT: 37.3 % — ABNORMAL LOW (ref 39.0–52.0)
HEMOGLOBIN: 12.4 g/dL — AB (ref 13.0–17.0)
LYMPHS ABS: 1.5 10*3/uL (ref 0.7–4.0)
Lymphocytes Relative: 13 %
MCH: 29.3 pg (ref 26.0–34.0)
MCHC: 33.2 g/dL (ref 30.0–36.0)
MCV: 88.2 fL (ref 78.0–100.0)
MONOS PCT: 9 %
Monocytes Absolute: 1 10*3/uL (ref 0.1–1.0)
NEUTROS PCT: 74 %
Neutro Abs: 8 10*3/uL — ABNORMAL HIGH (ref 1.7–7.7)
Platelets: 324 10*3/uL (ref 150–400)
RBC: 4.23 MIL/uL (ref 4.22–5.81)
RDW: 14 % (ref 11.5–15.5)
WBC: 10.9 10*3/uL — AB (ref 4.0–10.5)

## 2016-06-22 LAB — GLUCOSE, CAPILLARY
Glucose-Capillary: 100 mg/dL — ABNORMAL HIGH (ref 65–99)
Glucose-Capillary: 115 mg/dL — ABNORMAL HIGH (ref 65–99)

## 2016-06-22 MED ORDER — ENSURE ENLIVE PO LIQD
237.0000 mL | Freq: Two times a day (BID) | ORAL | Status: DC
  Administered 2016-06-22 – 2016-06-23 (×4): 237 mL via ORAL

## 2016-06-22 MED ORDER — ATORVASTATIN CALCIUM 40 MG PO TABS
40.0000 mg | ORAL_TABLET | Freq: Every day | ORAL | Status: DC
  Administered 2016-06-22 – 2016-06-23 (×2): 40 mg via ORAL
  Filled 2016-06-22 (×2): qty 1

## 2016-06-22 NOTE — Clinical Social Work Placement (Signed)
   CLINICAL SOCIAL WORK PLACEMENT  NOTE  Date:  06/22/2016  Patient Details  Name: SEVILLE DOWNS MRN: [id number] Date of Birth: Apr 05, 1960  Clinical Social Work is seeking post-discharge placement for this patient at the [facility] level of care (*CSW will initial, date and re-position this form in  chart as items are completed):  Yes   Patient/family provided with [facility] Work Department's list of facilities offering this level of care within the geographic area requested by the patient (or if unable, by the patient's family).  Yes   Patient/family informed of their freedom to choose among providers that offer the needed level of care, that participate in Medicare, Medicaid or managed care program needed by the patient, have an available bed and are willing to accept the patient.  Yes   Patient/family informed of [facility]'s ownership interest in [facility] and [facility], as well as of the fact that they are under no obligation to receive care at these facilities.  PASRR submitted to EDS on 06/22/16     PASRR number received on 06/22/16     Existing PASRR number confirmed on       FL2 transmitted to all facilities in geographic area requested by pt/family on 06/22/16     FL2 transmitted to all facilities within larger geographic area on       Patient informed that his/her managed care company has contracts with or will negotiate with certain facilities, including the following:        No   Patient/family informed of bed offers received.  Patient chooses bed at       Physician recommends and patient chooses bed at      Patient to be transferred to   on  .  Patient to be transferred to facility by       Patient family notified on   of transfer.  Name of family member notified:        PHYSICIAN       Additional Comment:    _______________________________________________ Lind Covert, LCSW 06/22/2016, 10:22 AM

## 2016-06-22 NOTE — Progress Notes (Signed)
Advanced Heart Failure Rounding Note   Primary Cardiologist: Dr. Haroldine Laws  Subjective:    Admitted 06/06/16 with acute respiratory failure requiring intubation secondary to volume overload and septic shock. 1/2 blood cultures positive for strep viridans.   Extubated 06/18/16. Creatinine 2.88->2.50->2.25->1.79->2.10->2.03. Weight down 29 pounds since admission. Feels good today. Sitting up in chair eating lunch. Denies SOB and chest pain. Has walked with PT.  Studies TEE 06/15/16 with LVEF 15%, No thrombus noted, RV mildly decreased in function, mild/mod LAE, moderate to severe MR with some restriction of posterior leaflet.  No endocarditis noted.   Objective:   Weight Range: 216 lb 0.8 oz (98 kg) Body mass index is 34.87 kg/m.   Vital Signs:   Temp:  [97.3 F (36.3 C)-98.5 F (36.9 C)] 97.3 F (36.3 C) (04/18 0755) Pulse Rate:  [76-88] 76 (04/18 0800) Resp:  [17-39] 21 (04/18 0800) BP: (98-131)/(68-92) 100/84 (04/18 0800) SpO2:  [96 %-100 %] 100 % (04/18 0800) Weight:  [216 lb 0.8 oz (98 kg)] 216 lb 0.8 oz (98 kg) (04/18 0600) Last BM Date: 06/21/16  Weight change: Filed Weights   06/18/16 0500 06/21/16 0500 06/22/16 0600  Weight: 216 lb 14.9 oz (98.4 kg) 219 lb 5.7 oz (99.5 kg) 216 lb 0.8 oz (98 kg)    Intake/Output:   Intake/Output Summary (Last 24 hours) at 06/22/16 1101 Last data filed at 06/22/16 0800  Gross per 24 hour  Intake              420 ml  Output             1425 ml  Net            -1005 ml     Physical Exam: General: Well appearing. No resp difficulty. HEENT: normal Neck: supple. No JVD. Carotids 2+ bilat; no bruits. No thyromegaly or nodule noted. Cor: PMI nondisplaced. RRR, 1/6 systolic murmur.  Lungs: CTAB, normal effort. Abdomen: soft, non-tender, distended, no HSM. No bruits or masses. +BS  Extremities: no cyanosis, clubbing, rash, R and LLE no edema.  Neuro: alert & orientedx3, cranial nerves grossly intact. moves all 4 extremities  w/o difficulty. Affect pleasant   Telemetry: A sensed, V paced - rate in the 80's.  Labs: CBC  Recent Labs  06/21/16 0300 06/22/16 0320  WBC 13.5* 10.9*  NEUTROABS  --  8.0*  HGB 12.9* 12.4*  HCT 38.5* 37.3*  MCV 88.5 88.2  PLT 330 248   Basic Metabolic Panel  Recent Labs  06/21/16 0300 06/22/16 0320  NA 132* 131*  K 4.8 4.6  CL 96* 96*  CO2 27 25  GLUCOSE 93 94  BUN 110* 91*  CREATININE 2.10* 2.03*  CALCIUM 9.4 9.3  PHOS 4.4 4.4   Liver Function Tests  Recent Labs  06/21/16 0300 06/22/16 0320  ALBUMIN 2.6* 2.8*    BNP: BNP (last 3 results)  Recent Labs  06/06/16 1645 06/06/16 1925  BNP 796.0* 553.0*   Transthoracic Echocardiography 06/09/16 Study Conclusions  - Left ventricle: The cavity size was severely dilated. Wall   thickness was normal. The estimated ejection fraction was 20%.   Diffuse hypokinesis. - Aortic valve: There was mild regurgitation. - Mitral valve: There was moderate regurgitation. - Left atrium: The atrium was moderately dilated. - Atrial septum: No defect or patent foramen ovale was identified.  Imaging/Studies:  No results found.    Medications:     Scheduled Medications: . atorvastatin  40 mg Oral QHS  .  carvedilol  3.125 mg Oral BID WC  . Chlorhexidine Gluconate Cloth  6 each Topical Daily  . colchicine  0.6 mg Oral Daily  . heparin  5,000 Units Subcutaneous Q8H  . hydrALAZINE  10 mg Oral Q8H  . isosorbide dinitrate  5 mg Oral BID  . mouth rinse  15 mL Mouth Rinse BID  . pantoprazole  40 mg Oral Q1200  . sodium chloride flush  10-40 mL Intracatheter Q12H  . tobramycin-dexamethasone   Right Eye BID    Infusions:   PRN Medications: ALPRAZolam, fentaNYL (SUBLIMAZE) injection, guaiFENesin, sodium chloride, sodium chloride flush   Assessment/Plan   1. Septic shock: CAP  - On ceftriaxone.  - Strep viridans 1/2 blood cultures, follow up cultures negative.  - ID following.  - TEE without endocarditis  or infected lead.  2. Acute hypoxic respiratory failure:  - Improving. Extubated, now on 4L [place].  - Asked about diaphragm plication yesterday, too ill to consider this for now.  3. Acute on chronic renal failure stage III - Baseline creatinine 1.7-1.8   Creatinine 2.03 today.  4. Chronic systolic CHF: EF 59% on 07/10/36 - Volume status stable.  - Does not appear volume overloaded on exam.  - Continue Coreg 6.25mg  BID - Continue hydralazine 10mg  TID.  - Continue isordil 5mg  BID - No ACE/ARB with CKD - BP stable, too soft to increase meds.  5. Paralyzed right hemidiaphragm - Stable.  - No change.  6. History of paraganglioma resection  Plan for inpatient rehab.   Length of Stay: Johnson Siding, NP  06/22/2016, 11:01 AM Advanced Heart Failure Team  Pager [phone] M-F 7am-4pm.  Please contact [facility] for night-coverage after hours (4p -7a ) and weekends on [email or web address]   Patient seen and examined with Jettie Booze, NP. We discussed all aspects of the encounter. I agree with the assessment and plan as stated above.   He continues to improve slowly. Volume status ok. Renal function improving. Will resume po diuretics in am.   Respiratory function tenuous but improving. Continue IS.   Continue PT.   Glori Bickers, MD  3:25 PM

## 2016-06-22 NOTE — Progress Notes (Signed)
Nutrition Follow-up  DOCUMENTATION CODES:   Obesity unspecified  INTERVENTION:    Ensure Enlive po BID, each supplement provides 350 kcal and 20 grams of protein  NUTRITION DIAGNOSIS:   Inadequate oral intake related to poor appetite as evidenced by meal completion < 50%.  Ongoing  GOAL:   Patient will meet greater than or equal to 90% of their needs  Unmet  MONITOR:   PO intake, Supplement acceptance, Labs, Skin, I & O's  ASSESSMENT:   56 yo male w/ PMHx of CHF, CKD, CAD, ICD, and hypothyroidism who presented w/ SOB on 4/2. He was initially tx for CHF exacerbation in the ED and then worked up for sepsis after developing temp of 104.83F while in the ED. Required intubation.  Extubated on 4/14. Diet has been advanced to heart healthy. Patient reports improving oral intake, consuming 25-50% of meals. Agreed to receive Ensure supplements between meals to maximize oral intake. Labs and medications reviewed.  Diet Order:  Diet Heart Room service appropriate? Yes; Fluid consistency: Thin  Skin:  Wound (see comment) (stage II to back)  Last BM:  4/17  Height:   Ht Readings from Last 1 Encounters:  06/06/16 5\' 6"  (1.676 m)    Weight:   Wt Readings from Last 1 Encounters:  06/22/16 216 lb 0.8 oz (98 kg)    Ideal Body Weight:  64.5 kg  BMI:  Body mass index is 34.87 kg/m.  Estimated Nutritional Needs:   Kcal:  8250-0370  Protein:  129 gm  Fluid:  2 L  EDUCATION NEEDS:   No education needs identified at this time  Molli Barrows, [place], [place], [place] Pager [phone] After Hours Pager [phone]

## 2016-06-22 NOTE — Consult Note (Signed)
Physical Medicine and Rehabilitation Consult Reason for Consult: Debilitation related to CHF exacerbation with sepsis Referring Physician: [facility]   HPI: [name] is a 56 y.o. right handed male with history of diastolic congestive heart failure. Hemidiaphragm status post para ganglioglioma resection, CKD stage III, CAD-ICD, right corneal eye surgery. Per chart review patient lives with spouse as well as wheelchair bound teenager who is independent from wheelchair level . Reported to be independent prior to admission and still working as well as his wife works. Two-level home with bed and bathroom on first floor. He presented 06/06/2016 with increasing shortness of breath and diaphoresis. Oxygen saturations 83%. Troponin negative. Noted creatinine of 2.40 from baseline 1.71. Hyperkalemia 6.4. Chest x-ray negative for acute process. Initially treated for CHF exacerbation with BiPAP, IV Lasix. Patient later required intubation for respiratory distress. Blood cultures grew strep viridans in one of 2 bottles. Maintain on broad-spectrum antibiotics. Echocardiogram with ejection fraction of 20% diffuse hypokinesis no PFO. TEE moderate to severe mitral regurgitation no vegetation. Renal ultrasound negative for hydronephrosis with follow-up per renal services and maintain on gentle IV fluids was creatinine improved 2.03. Patient remained intubated until 06/19/2016. Subcutaneous heparin added for DVT prophylaxis. Currently completing a course of intravenous Rocephin for CAP. Diet has been advanced to a regular consistency. Physical therapy evaluation completed and ongoing with recommendations of physical medicine rehabilitation consult.   Review of Systems  Constitutional: Negative for chills and fever.  HENT: Negative for hearing loss and tinnitus.   Eyes: Positive for blurred vision. Negative for double vision.  Respiratory: Positive for shortness of breath and wheezing.   Cardiovascular:  Positive for palpitations. Negative for chest pain.  Gastrointestinal: Positive for constipation. Negative for nausea and vomiting.  Genitourinary: Positive for urgency. Negative for dysuria, flank pain and hematuria.  Musculoskeletal: Positive for myalgias.  Skin: Negative for rash.  Neurological: Positive for weakness. Negative for seizures.  All other systems reviewed and are negative.  Past Medical History:  Diagnosis Date  . Arthritis    GOUT  . Automatic implantable cardioverter-defibrillator in situ   . Biventricular ICD (implantable cardiac defibrillator) [employer] ]    DOI 2008/ upgrade 2010/ Gen Change 2014  . Bladder tumor    PT HOSP AT [facility] 4/24 TO 4/26 2014 WITH UTI--AND FOUND TO HAVE BLADDER TUMOR-  . Cardiomyopathy    Idiopathic dilated;   . CHF (congestive heart failure) ([facility])   . CKD (chronic kidney disease)    stage III baseline Crt 1.8-2.0  . Coronary artery disease   . Gout 08/03/12   PT C/O OF RIGHT KNEE PAIN AND SWELLING -STATES GOUT FLARE UP - ONGOING SINCE APRIL - BUT SWELLING W/IN LAST WEEK  . Hilar mass    Noted CT 2010  . HTN (hypertension)    Dr. Caryl Comes cardiologist  . Hypothyroidism   . 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 Sleep apnea    CPAP  . Systolic CHF ([place])    EF 67%  . Ventricular tachycardia [facility])    s/p ICD   Past Surgical History:  Procedure Laterality Date  . BIV ICD GENERTAOR CHANGE OUT     DOI 2008/ upgrade 2010/ Gen Change 2014   . CARDIAC CATHETERIZATION     he was found to have normal coronary arteries but with a globally dilated and hypocontractile heart  . [place]     DOI 2008/ upgrade 2010/  Gen Change 2014   . CHEST TUBE INSERTION  09/10/2013  . CHOLECYSTECTOMY    . cornea replacement    . ENDOBRONCHIAL ULTRASOUND Bilateral 10/01/2012   Procedure: ENDOBRONCHIAL ULTRASOUND;  Surgeon: Collene Gobble, MD;  Location: [facility] ENDOSCOPY;  Service:  Cardiopulmonary;  Laterality: Bilateral;  . IMPLANTABLE CARDIOVERTER DEFIBRILLATOR (ICD) GENERATOR CHANGE N/A 05/02/2012   Procedure: ICD GENERATOR CHANGE;  Surgeon: Deboraha Sprang, MD;  Location: [facility] CATH LAB;  Service: Cardiovascular;  Laterality: N/A;  . MEDIASTINOSCOPY N/A 10/25/2012   Procedure: MEDIASTINOSCOPY;  Surgeon: [name], MD;  Location: [place];  Service: Thoracic;  Laterality: N/A;  . MEDIASTINOTOMY  09/10/2013   paratracheal mass    DR Darcey Nora  . MEDIASTINOTOMY CHAMBERLAIN MCNEIL Right 09/10/2013   Procedure: MEDIASTINOTOMY CHAMBERLAIN MCNEIL;  Surgeon: [name], MD;  Location: [place];  Service: Thoracic;  Laterality: Right;  . RESECTION OF MEDIASTINAL MASS Right 02/13/2014   Procedure: RESECTION OF MEDIASTINAL MASS;  Surgeon: [name], MD;  Location: [facility] OR;  Service: Thoracic;  Laterality: Right;  PATIENT NEEDS EPIDURAL CATHETER AND A SWAN GANZ CATHETER (DR. CHRIS MOSER AWARE PER PVT)  . RIGHT HEART CATHETERIZATION N/A 01/27/2012   Procedure: RIGHT HEART CATH;  Surgeon: Jolaine Artist, MD;  Location: [facility] CATH LAB;  Service: Cardiovascular;  Laterality: N/A;  . TONSILLECTOMY    . TRANSURETHRAL RESECTION OF BLADDER TUMOR N/A 08/13/2012   Procedure: TRANSURETHRAL RESECTION OF BLADDER TUMOR (TURBT);  Surgeon: Claybon Jabs, MD;  Location: [facility] ORS;  Service: Urology;  Laterality: N/A;  MITOMYCIN C  . US ECHOCARDIOGRAPHY  03-17-2008, 07-03-2006   EF 15-20%, EF 15-20%  . VIDEO ASSISTED THORACOSCOPY (VATS)/THOROCOTOMY Right 02/13/2014   Procedure: VIDEO ASSISTED THORACOSCOPY (VATS)/THOROCOTOMY;  Surgeon: [name], MD;  Location: [facility] OR;  Service: Thoracic;  Laterality: Right;  PATIENT NEEDS EPIDURAL CATHETER (DR. CHRIS MOSER AWARE PER PVT)  . VIDEO BRONCHOSCOPY N/A 10/25/2012   Procedure: VIDEO BRONCHOSCOPY;  Surgeon: [name], MD;  Location: [facility] OR;  Service: Thoracic;  Laterality: N/A;   Family History  Problem Relation Age of Onset  . Family history  unknown: Yes   Social History:  reports that he has never smoked. He has never used smokeless tobacco. He reports that he does not drink alcohol or use drugs. Allergies:  Allergies  Allergen Reactions  . Bidil [Isosorb Dinitrate-Hydralazine] Other (See Comments)    Headaches   . Spironolactone Other (See Comments)    Hyperkalemia   . Hydralazine Hcl Other (See Comments)    "Fuzzy headed" feeling   Medications Prior to Admission  Medication Sig Dispense Refill  . atorvastatin (LIPITOR) 40 MG tablet TAKE 1 TABLET BY MOUTH AT BEDTIME 30 tablet 2  . BREO ELLIPTA 100-25 MCG/INH AEPB Inhale 1 puff into the lungs daily as needed (for shortness of breath).   11  . carvedilol (COREG) 12.5 MG tablet Take 6.25 mg by mouth 2 (two) times daily with a meal.     . gabapentin (NEURONTIN) 300 MG capsule Take 300 mg by mouth 2 (two) times daily. Pain    . hydrALAZINE (APRESOLINE) 50 MG tablet Take 1 tablet (50 mg total) by mouth 2 (two) times daily. (Patient taking differently: Take 25 mg by mouth 2 (two) times daily. ) 180 tablet 3  . HYDROcodone-acetaminophen (NORCO/VICODIN) 5-325 MG tablet Take 0.5 tablets by mouth 2 (two) times daily as needed (for pain).     . magnesium oxide (MAG-OX) 400 MG tablet Take two  tablets (800 mg) by mouth twice daily 120 tablet 11  . omeprazole (PRILOSEC) 20 MG capsule Take 20 mg by mouth at bedtime.    Marland Kitchen tobramycin-dexamethasone (TOBRADEX) ophthalmic solution Place 1 drop into the right eye 2 (two) times daily.    Marland Kitchen torsemide (DEMADEX) 10 MG tablet Take 10 mg by mouth daily. Or as directed by your physician    . MITIGARE 0.6 MG CAPS Take 0.6 mg by mouth daily. (Patient not taking: Reported on 06/07/2016) 30 capsule 6    Home: Home Living Family/patient expects to be discharged to:: Private residence Living Arrangements: Spouse/significant other, Children Available Help at Discharge: Family, Available 24 hours/day Type of Home: House Home Access: Stairs to  enter [employer] of Steps: 2 Entrance Stairs-Rails: None Home Layout: Two level, 1/2 bath on main level, Able to live on main level with bedroom/bathroom Alternate Level Stairs-Number of Steps: 12 Alternate Level Stairs-Rails: Right Home Equipment: Cane - quad, Shower seat - built in Additional Comments: Per pt, child is in a wheelchair.  They have to assist child with B/D but once they get child in wheelchair he is Modif I in the home and they both work.   Functional History: Prior Function Level of Independence: Independent Functional Status:  Mobility: Bed Mobility Overal bed mobility: Needs Assistance Bed Mobility: Rolling, Sidelying to Sit Rolling: Min assist Sidelying to sit: Mod assist, +2 for physical assistance Supine to sit: +2 for physical assistance, Mod assist Sit to supine: Mod assist, +2 for physical assistance General bed mobility comments: Pt up in chair Transfers Overall transfer level: Needs assistance Equipment used: Rolling walker (2 wheeled) Transfer via Lift Equipment: [place] Transfers: Sit to/from Stand Sit to Stand: +2 physical assistance, Mod assist Squat pivot transfers: Max assist, +2 physical assistance General transfer comment: Assist to bring hips a;nd trunk up.  Verbal cues for hand placement Ambulation/Gait Ambulation/Gait assistance: Mod assist, +2 safety/equipment Ambulation Distance (Feet): 10 Feet (10' x 1, 5' x 1) Assistive device: Rolling walker (2 wheeled) Gait Pattern/deviations: Step-through pattern, Decreased step length - right, Decreased step length - left, Shuffle, Trunk flexed General Gait Details: Assist for balance and support. Verbal cues to stand more upright Gait velocity: decr Gait velocity interpretation: Below normal speed for age/gender    ADL:    Cognition: Cognition Overall Cognitive Status: Within Functional Limits for tasks assessed Orientation Level: Oriented X4 Cognition Arousal/Alertness:  Awake/alert Behavior During Therapy: WFL for tasks assessed/performed Overall Cognitive Status: Within Functional Limits for tasks assessed Difficult to assess due to: Intubated  Blood pressure 100/84, pulse 76, temperature 97.3 F (36.3 C), temperature source Oral, resp. rate (!) 21, height 5\' 6"  (1.676 m), weight 98 kg (216 lb 0.8 oz), SpO2 100 %. Physical Exam  Vitals reviewed. Constitutional: He is oriented to person, place, and time. He appears well-developed.  Obese  HENT:  Head: Normocephalic and atraumatic.  Eyes: Conjunctivae are normal. Right eye exhibits no discharge. Left eye exhibits no discharge.  Right eye with decreased vision  Neck: Normal range of motion. Neck supple. No thyromegaly present.  Cardiovascular: Normal rate and regular rhythm.   Respiratory: Effort normal.  Fair inspiratory effort without wheeze  GI: Soft. Bowel sounds are normal. He exhibits no distension.  Musculoskeletal: He exhibits no edema or tenderness.  Neurological: He is alert and oriented to person, place, and time.  Follow full commands Sensation intact to light touch Motor: 4-4+/5 throughout  Skin: Skin is warm and dry.  Psychiatric: He has a  normal mood and affect. His behavior is normal. Thought content normal.    Results for orders placed or performed during the hospital encounter of 06/06/16 (from the past 24 hour(s))  Glucose, capillary     Status: Abnormal   Collection Time: 06/21/16 11:53 AM  Result Value Ref Range   Glucose-Capillary 112 (H) 65 - 99 mg/dL   Comment 1 Capillary Specimen   Glucose, capillary     Status: Abnormal   Collection Time: 06/21/16  3:52 PM  Result Value Ref Range   Glucose-Capillary 145 (H) 65 - 99 mg/dL   Comment 1 Capillary Specimen   Glucose, capillary     Status: Abnormal   Collection Time: 06/21/16  7:55 PM  Result Value Ref Range   Glucose-Capillary 105 (H) 65 - 99 mg/dL   Comment 1 Notify RN   Glucose, capillary     Status: Abnormal    Collection Time: 06/22/16 12:07 AM  Result Value Ref Range   Glucose-Capillary 100 (H) 65 - 99 mg/dL   Comment 1 Notify RN   Renal function panel     Status: Abnormal   Collection Time: 06/22/16  3:20 AM  Result Value Ref Range   Sodium 131 (L) 135 - 145 mmol/L   Potassium 4.6 3.5 - 5.1 mmol/L   Chloride 96 (L) 101 - 111 mmol/L   CO2 25 22 - 32 mmol/L   Glucose, Bld 94 65 - 99 mg/dL   BUN 91 (H) 6 - 20 mg/dL   Creatinine, Ser 2.03 (H) 0.61 - 1.24 mg/dL   Calcium 9.3 8.9 - 10.3 mg/dL   Phosphorus 4.4 2.5 - 4.6 mg/dL   Albumin 2.8 (L) 3.5 - 5.0 g/dL   GFR calc non Af Amer 35 (L) >60 mL/min   GFR calc Af Amer 40 (L) >60 mL/min   Anion gap 10 5 - 15  CBC with Differential/Platelet     Status: Abnormal   Collection Time: 06/22/16  3:20 AM  Result Value Ref Range   WBC 10.9 (H) 4.0 - 10.5 K/uL   RBC 4.23 4.22 - 5.81 MIL/uL   Hemoglobin 12.4 (L) 13.0 - 17.0 g/dL   HCT 37.3 (L) 39.0 - 52.0 %   MCV 88.2 78.0 - 100.0 fL   MCH 29.3 26.0 - 34.0 pg   MCHC 33.2 30.0 - 36.0 g/dL   RDW 14.0 11.5 - 15.5 %   Platelets 324 150 - 400 K/uL   Neutrophils Relative % 74 %   Neutro Abs 8.0 (H) 1.7 - 7.7 K/uL   Lymphocytes Relative 13 %   Lymphs Abs 1.5 0.7 - 4.0 K/uL   Monocytes Relative 9 %   Monocytes Absolute 1.0 0.1 - 1.0 K/uL   Eosinophils Relative 4 %   Eosinophils Absolute 0.4 0.0 - 0.7 K/uL   Basophils Relative 0 %   Basophils Absolute 0.0 0.0 - 0.1 K/uL  Glucose, capillary     Status: Abnormal   Collection Time: 06/22/16  3:59 AM  Result Value Ref Range   Glucose-Capillary 115 (H) 65 - 99 mg/dL   Comment 1 Notify RN    Dg Chest Port 1 View  Result Date: 06/20/2016 CLINICAL DATA:  Sudden SOB with right rib pain tonight, r/o pneumothorax. EXAM: PORTABLE CHEST 1 VIEW COMPARISON:  Chest x-rays dated 06/18/2016 and 06/08/2016. FINDINGS: Cardiomegaly appears stable. Again noted is central pulmonary vascular congestion without overt alveolar pulmonary edema. No new lung findings. No large  pleural effusion. No pneumothorax seen. Left chest  wall pacemaker in place. Right IJ central line appears stable in position with tip at the level of the mid SVC. Old healed fractures within the right ribs. No acute fracture or dislocation seen. IMPRESSION: 1. No acute findings.  No pneumothorax seen. 2. Stable cardiomegaly with central pulmonary vascular congestion suggesting mild CHF/volume overload. No evidence of overt alveolar pulmonary edema. No evidence of pneumonia or large pleural effusion. Electronically Signed   By: Franki Cabot M.D.   On: 06/20/2016 18:41    Assessment/Plan: Diagnosis:Debilitation  Labs and images independently reviewed.  Records reviewed and summated above.  1. Does the need for close, 24 hr/day medical supervision in concert with the patient's rehab needs make it unreasonable for this patient to be served in a less intensive setting? Yes 2. Co-Morbidities requiring supervision/potential complications: combined congestive heart failure  (Monitor in accordance with increased physical activity and avoid UE resistance excercises), Hemidiaphragm status post para ganglioglioma resection (monitor O2 sats and RR with increased exertion), CKD stage III (avoid nephrotoxic meds), CAD-ICD, right corneal eye surgery, mitral regurgitation (see CHF), CAP (cont abx), tachypnea, (monitor RR and O2 Sats with increased physical exertion), hyponatremia (cont to monitor, treat if necessary), ABLA (transfuse if necessary to ensure appropriate perfusion for increased activity tolerance), leukocytosis (cont to monitor for signs and symptoms of infection, further workup if indicated) 3. Due to safety, disease management and patient education, does the patient require 24 hr/day rehab nursing? Yes 4. Does the patient require coordinated care of a physician, rehab nurse, PT (1-2 hrs/day, 5 days/week) and OT (1-2 hrs/day, 5 days/week) to address physical and functional deficits in the context of the  above medical diagnosis(es)? Yes Addressing deficits in the following areas: balance, endurance, locomotion, strength, transferring, bathing, dressing, toileting and psychosocial support 5. Can the patient actively participate in an intensive therapy program of at least 3 hrs of therapy per day at least 5 days per week? Yes 6. The potential for patient to make measurable gains while on inpatient rehab is excellent 7. Anticipated functional outcomes upon discharge from inpatient rehab are modified independent and supervision  with PT, modified independent and supervision with OT, n/a with SLP. 8. Estimated rehab length of stay to reach the above functional goals is: 8-13 days. 9. Does the patient have adequate social supports and living environment to accommodate these discharge functional goals? Yes 10. Anticipated D/C setting: Home 11. Anticipated post D/C treatments: HH therapy and Home excercise program 12. Overall Rehab/Functional Prognosis: good  RECOMMENDATIONS: This patient's condition is appropriate for continued rehabilitative care in the following setting: CIR Patient has agreed to participate in recommended program. Yes Note that insurance prior authorization may be required for reimbursement for recommended care.  Comment: Rehab Admissions Coordinator to follow up.  Delice Lesch, MD, Mellody Drown Cathlyn Parsons., PA-C 06/22/2016

## 2016-06-22 NOTE — Progress Notes (Signed)
Physical Therapy Treatment Patient Details Name: [name] MRN: [id number] DOB: Aug 07, 1960 Today's Date: 06/22/2016    History of Present Illness 20M w/ PMHx of CHF, gout, cardiomyopathy, hemidiaphragm s/p paraganglioma resection, CKD, CAD, ICD, and hypothyroidism who presents w/ SOB who was initially tx for CHF exacerbation in the ED and then worked up for sepsis. Intubated 4/2. Extubated 4/15.    PT Comments    Pt demonstrated significant improvement in walking tolerance and transfer capability. Pt tolerated amb 50' x2 with RW. Pt remains deconditioned and requires assist for all mobility. Con't to benefit from CIR for maximial functional recovery upon d/c once medically d/c'd.    Follow Up Recommendations  CIR;Supervision/Assistance - 24 hour     Equipment Recommendations  Rolling walker with 5" wheels;3in1 (PT)    Recommendations for Other Services       Precautions / Restrictions Precautions Precautions: Fall Restrictions Weight Bearing Restrictions: No    Mobility  Bed Mobility Overal bed mobility: Needs Assistance Bed Mobility: Sit to Supine       Sit to supine: Min guard   General bed mobility comments: increased time but able to complete with v/c's for sequencing/direction but no physical assist needed  Transfers Overall transfer level: Needs assistance Equipment used: Rolling walker (2 wheeled) Transfers: Sit to/from Stand Sit to Stand: Min assist         General transfer comment: v/c's for hand placement to push off arm rests, increased time  Ambulation/Gait Ambulation/Gait assistance: Min assist;+2 safety/equipment Ambulation Distance (Feet): 50 Feet (x2) Assistive device: Rolling walker (2 wheeled) Gait Pattern/deviations: Step-through pattern;Trunk flexed Gait velocity: decreased Gait velocity interpretation: Below normal speed for age/gender General Gait Details: v/c's for upright posture and decreased UE dependence. required 5 min  seated rest break between bouts   Stairs            Wheelchair Mobility    Modified Rankin (Stroke Patients Only)       Balance Overall balance assessment: Needs assistance Sitting-balance support: No upper extremity supported;Feet supported Sitting balance-Leahy Scale: Fair     Standing balance support: Bilateral upper extremity supported Standing balance-Leahy Scale: Poor Standing balance comment: dependent on RW                            Cognition Arousal/Alertness: Awake/alert Behavior During Therapy: WFL for tasks assessed/performed Overall Cognitive Status: Within Functional Limits for tasks assessed                                        Exercises      General Comments        Pertinent Vitals/Pain Pain Assessment: No/denies pain    Home Living                      Prior Function            PT Goals (current goals can now be found in the care plan section) Acute Rehab PT Goals Patient Stated Goal: get moving Progress towards PT goals: Progressing toward goals    Frequency    Min 3X/week      PT Plan Current plan remains appropriate    Co-evaluation             End of Session Equipment Utilized During Treatment: Gait belt Activity Tolerance: Patient tolerated treatment well  Patient left: in bed;with call bell/phone within reach;with family/visitor present Nurse Communication: Mobility status PT Visit Diagnosis: Unsteadiness on feet (R26.81);Muscle weakness (generalized) (M62.81)     Time: 9518-8416 PT Time Calculation (min) (ACUTE ONLY): 29 min  Charges:  $Gait Training: 8-22 mins $Therapeutic Activity: 8-22 mins                    G Codes:       Kittie Plater, PT, DPT Pager #: [phone] Office #: [phone]    [place] 06/22/2016, 2:18 PM

## 2016-06-22 NOTE — Progress Notes (Signed)
Attempted to call report to 3E. RN not available. Will call back in 10 minutes.

## 2016-06-22 NOTE — Progress Notes (Signed)
  Speech Language Pathology Treatment: Dysphagia  Patient Details Name: [name] MRN: [id number] DOB: 1960/05/04 Today's Date: 06/22/2016 Time: 7017-7939 SLP Time Calculation (min) (ACUTE ONLY): 12 min  Assessment / Plan / Recommendation Clinical Impression  Pt with excellent toleration of PO diet.  No clinical s/s of aspiration.  Lung sounds clear upper, diminished at bases.  Consuming regular solids and thin liquids without self reported difficulty.  Continue this diet.  No further SLP f/u warranted - our services will sign off.   HPI HPI: 41M w/ PMHx of CHF, hemidiaphragm s/p paraganglioma resection,CKD, CAD s/p Biventricular ICD, and hypothyroidism who presentedw/ SOB and diaphoresis who was initially tx for CHF exacerbation in the ED. He was placed on bipap, gived IV lasix and IV nitro but had tachypnea and diaphoresis which prompted intubation 4/2. Hedeveloping temp of 104.70F while in the ED, code sepsis was called. Blood cultures grew strep viridans in 1 of 2 bottles. Failed extubation 4/10 and was reintubated, subsequently extubated 06/18/16. Pt with diaphragm paralysis, acute respiratory failure with hypoxia, central line infection. 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 Pt had Barium swallow 09/16/13 showing no gross mass effect or esophageal displacemetn, hypopharyngeal portion in normal limits, no anatomic explanation for pt's symptoms.      SLP Plan  Discharge SLP treatment due to (comment)       Recommendations  Diet recommendations: Regular;Thin liquid Liquids provided via: Cup;Straw Medication Administration: Whole meds with liquid Supervision: Patient able to self feed Postural Changes and/or Swallow Maneuvers: Seated upright 90 degrees                Oral Care Recommendations: Oral care BID Follow up Recommendations: None SLP Visit Diagnosis: Dysphagia, unspecified (R13.10) Plan: Discharge  SLP treatment due to (comment)       GO                [name] 06/22/2016, 10:20 AM

## 2016-06-22 NOTE — NC FL2 (Signed)
[place] MEDICAID FL2 LEVEL OF CARE SCREENING TOOL     IDENTIFICATION  Patient Name: [name] Birthdate: 1960-11-16 Sex: male Admission Date (Current Location): 06/06/2016  [facility] and [place] Number:  Herbalist and Address:  [facility], [place] [address], [place], [place] [address]      Provider Number: [id number]  Attending Physician Name and Address:  Cherene Altes, MD  Relative Name and Phone Number:  Daniell [facility]) [phone]    Current Level of Care: Hospital Recommended Level of Care: [place] Prior Approval Number:    Date Approved/Denied:   PASRR Number: [id number]  Discharge Plan: SNF    Current Diagnoses: Patient Active Problem List   Diagnosis Date Noted  . Bacteremia due to Streptococcus 06/14/2016  . Central line infection   . Infected defibrillator ([place])   . History of ETT   . Acute on chronic respiratory failure with hypoxemia ([place])   . Pressure injury of skin 06/08/2016  . Acute respiratory failure with hypoxia ([place]) 06/06/2016  . CKD (chronic kidney disease) stage 3, GFR 30-59 ml/min 02/01/2015  . 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 CHF exacerbation ([place]) 01/31/2015  . Acute on chronic heart failure ([place]) 01/31/2015  . AKI (acute kidney injury) ([place]) 01/31/2015  . Gout flare 01/31/2015  . OSA on CPAP 01/31/2015  . Leukocytosis 01/31/2015  . 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 History of cardiac arrhythmia 09/19/2014  . CKD (chronic kidney disease) stage 2, GFR 60-89 ml/min 09/19/2014  . Other fatigue 09/19/2014  . Hip pain 09/19/2014  . History of gout 09/19/2014  . History of bladder carcinoma 09/19/2014  . 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 Chest discomfort 09/10/2013  . Bladder cancer ([place]) 11/09/2012  . Preoperative cardiovascular examination 09/11/2012  . Malignant tumor of urinary bladder ([place]) 07/13/2012  . Chronic combined systolic and diastolic CHF  (congestive heart failure) ([place]) 06/28/2012  . Gout 05/17/2012  . Inappropriate shocks from ICD -secondary T wave oversensing in the context of hyperkalemia 04/12/2012  . Automatic implantable cardioverter-defibrillator in situ 05/17/2011  . 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 ORTHOSTATIC DIZZINESS 11/03/2009  . VENTRICULAR TACHYCARDIA 10/20/2008  . SYSTOLIC HEART FAILURE, CHRONIC 10/20/2008  . SOB (shortness of breath) 03/25/2008  . Obstructive sleep apnea 03/12/2007  . Essential hypertension, benign 03/12/2007  . CARDIOMYOPATHY 03/12/2007    Orientation RESPIRATION BLADDER Height & Weight     Self, Time, Situation, Place  O2 (2L Nasal Cannula) Continent Weight: 216 lb 0.8 oz (98 kg) Height:  5\' 6"  (167.6 cm) (measure per RRT)  BEHAVIORAL SYMPTOMS/MOOD NEUROLOGICAL BOWEL NUTRITION STATUS   (NONE)   Continent Diet (Cardiac Diet)  AMBULATORY STATUS COMMUNICATION OF NEEDS Skin   Extensive Assist Verbally PU Stage and Appropriate Care   PU Stage 2 Dressing:  (Mid Back; Foam Dressing- PRN )                   Personal Care Assistance Level of Assistance  Bathing, Feeding, Dressing Bathing Assistance: Limited assistance Feeding assistance: Independent Dressing Assistance: Limited assistance     Functional Limitations Info  Sight, Hearing, Speech Sight Info: Impaired (Severe Cataract) Hearing Info: Adequate Speech Info: Adequate    SPECIAL CARE FACTORS FREQUENCY  PT (By licensed PT), OT (By licensed OT)     PT Frequency: Min 3X/week OT Frequency: Min 3X/week            Contractures Contractures Info: Not present  Additional Factors Info  Code Status, Allergies Code Status Info: FULL Allergies Info: Bidil (Isosorb Dinitrate- Hydralazine); Spironolactone; Hydralazine Hcl           Current Medications (06/22/2016):  This is the current hospital active medication list Current Facility-Administered Medications  Medication Dose Route Frequency  Provider Last Rate Last Dose  . ALPRAZolam Duanne Moron) tablet 0.25-0.5 mg  0.25-0.5 mg Oral TID PRN Cherene Altes, MD   0.25 mg at 06/20/16 1959  . atorvastatin (LIPITOR) tablet 40 mg  40 mg Oral QHS Cherene Altes, MD      . carvedilol (COREG) tablet 3.125 mg  3.125 mg Oral BID WC Serita Grammes, MD   Stopped at 06/21/16 1748  . Chlorhexidine Gluconate Cloth 2 % PADS 6 each  6 each Topical Daily Rush Farmer, MD   6 each at 06/21/16 [phone]  . colchicine tablet 0.6 mg  0.6 mg Oral Daily Ledell Noss, MD   0.6 mg at 06/21/16 0900  . fentaNYL (SUBLIMAZE) injection 12.5-25 mcg  12.5-25 mcg Intravenous Q2H PRN Juanito Doom, MD   25 mcg at 06/18/16 1840  . guaiFENesin (MUCINEX) 12 hr tablet 600 mg  600 mg Oral BID PRN Ledell Noss, MD      . heparin injection 5,000 Units  5,000 Units Subcutaneous Q8H Anders Simmonds, MD   5,000 Units at 06/22/16 0601  . hydrALAZINE (APRESOLINE) tablet 10 mg  10 mg Oral Q8H Serita Grammes, MD   10 mg at 06/21/16 2119  . isosorbide dinitrate (ISORDIL) tablet 5 mg  5 mg Oral BID Almyra Deforest, PA   5 mg at 06/21/16 2119  . MEDLINE mouth rinse  15 mL Mouth Rinse BID Cherene Altes, MD   15 mL at 06/21/16 2124  . pantoprazole (PROTONIX) EC tablet 40 mg  40 mg Oral Q1200 Cherene Altes, MD   40 mg at 06/21/16 1150  . sodium chloride (OCEAN) 0.65 % nasal spray 1 spray  1 spray Each Nare PRN Bethany Molt, DO      . sodium chloride flush (NS) 0.9 % injection 10-40 mL  10-40 mL Intracatheter Q12H Rush Farmer, MD   10 mL at 06/21/16 2127  . sodium chloride flush (NS) 0.9 % injection 10-40 mL  10-40 mL Intracatheter PRN Rush Farmer, MD      . tobramycin-dexamethasone [facility]) ophthalmic ointment   Right Eye BID Rush Farmer, MD   1 application at 10/62/69 2124     Discharge Medications: Please see discharge summary for a list of discharge medications.  Relevant Imaging Results:  Relevant Lab Results:   Additional Information SS #  [id number]  Lind Covert, LCSW

## 2016-06-22 NOTE — Progress Notes (Signed)
[place] TEAM 1 - Stepdown/ICU TEAM  [name]  ASN:[id number] DOB: May 09, 1960 DOA: 06/06/2016 PCP: Gennette Pac, MD    Brief Narrative:  18F w/ Hx of CHF, hemidiaphragm paralysis s/p paraganglioma resection,CKD, CAD s/p BiV ICD, and hypothyroidism who presentedw/ SOB and diaphoresis.  He was initially tx for a CHF exacerbation w/ bipap, IV lasix, and IV nitro.  Nonetheless he developed severe tachypnea and diaphoresis requiring intubation 4/2. He developed a temp of 104.24F while still in the ED, and blood cultures grew strep viridans in 1 of 2 bottles.  Significant Events: 4/2 admit - intubated 4/5 TTE - EF 20% 4/6 renal US - no hydro - 1.7cm L kidney cyst 4/10 TEE mod/severe mitral regurg - no vegetations 4/15 extubated  Subjective: The pt is sitting up at the bedside complaining that he has not yet gotten his breakfast.  He denies cp, sob, n/v, or abdom pain, and is eager to ambulate.    Assessment & Plan:  Acute hypoxic respiratory failure due to pulmonary edema  sats 100% on 2L only - wean to RA as able   Acute exacerbation of Chronic systolic HF EF 79% on TTE 06/09/16 CHF Team following - net positive ~8L - baseline dry wgt appears to be ~102kg (but is quite variable) - no clinical evidence of volume overload at this time   [facility] Weights   06/18/16 0500 06/21/16 0500 06/22/16 0600  Weight: 98.4 kg (216 lb 14.9 oz) 99.5 kg (219 lb 5.7 oz) 98 kg (216 lb 0.8 oz)    Acute renal failure on CKD stage 3 - marked azotemia  Baseline crt ~1.9 - renal fxn has returned to baseline - BUN remains markedly elevated but is slowly improving   Recent Labs Lab 06/18/16 2058 06/19/16 0500 06/20/16 0450 06/21/16 0300 06/22/16 0320  CREATININE 2.50* 2.25* 1.79* 2.10* 2.03*    Strep viridans bacteremia  TEE 06/15/16 with no appreciated endocarditis - 1 of 4 total blood cx positive - plan is for 14 days of rocephin per ID   Paralyzed right hemidiaphragm s/p paraganglioma  resection  HTN Presently well controlled   CAD Asymptomatic   Obesity - Body mass index is 34.87 kg/m.   DVT prophylaxis: heparin  Code Status: FULL CODE Family Communication: no family present at time of exam  Disposition Plan: ready for tele bed - d/c CVL - PT/OT - wean O2 - watch renal fxn and volume status   Consultants:  PCCM EP ID Nephrology  CHF Team  Antimicrobials:  Vancomycin 4/2 > 4/3  Zosyn 4/2 > 4/5  Azithromycin 4/5 > 4/9  Rocephin 4/5 >   Objective: Blood pressure 100/84, pulse 76, temperature 97.3 F (36.3 C), temperature source Oral, resp. rate (!) 21, height 5\' 6"  (1.676 m), weight 98 kg (216 lb 0.8 oz), SpO2 100 %.  Intake/Output Summary (Last 24 hours) at 06/22/16 0836 Last data filed at 06/22/16 0800  Gross per 24 hour  Intake              600 ml  Output             1625 ml  Net            -1025 ml   Filed Weights   06/18/16 0500 06/21/16 0500 06/22/16 0600  Weight: 98.4 kg (216 lb 14.9 oz) 99.5 kg (219 lb 5.7 oz) 98 kg (216 lb 0.8 oz)    Examination: General: No acute respiratory distress - alert and oriented  Lungs: Clear to auscultation bilaterally without wheezes or crackles - poor air movement R base  Cardiovascular: Regular rate and rhythm without rub or gallup  Abdomen: NT, ND, soft, BS+, no rebound, no mass  Extremities: No significant edema bilateral lower extremities  CBC:  Recent Labs Lab 06/17/16 0402 06/18/16 0500 06/19/16 0500 06/21/16 0300 06/22/16 0320  WBC 14.9* 19.4* 18.9* 13.5* 10.9*  NEUTROABS  --   --   --   --  8.0*  HGB 11.8* 12.5* 12.2* 12.9* 12.4*  HCT 36.3* 38.6* 37.8* 38.5* 37.3*  MCV 89.4 90.4 90.4 88.5 88.2  PLT 243 280 296 330 194   Basic Metabolic Panel:  Recent Labs Lab 06/18/16 0500 06/18/16 2058 06/19/16 0500 06/20/16 0450 06/21/16 0300 06/22/16 0320  NA 147* 144 142 136 132* 131*  K 4.6 4.4 4.2 4.1 4.8 4.6  CL 104 101 102 99* 96* 96*  CO2 29 29 30 28 27 25   GLUCOSE 155* 176*  147* 141* 93 94  BUN 159* 155* 160* 118* 110* 91*  CREATININE 2.88* 2.50* 2.25* 1.79* 2.10* 2.03*  CALCIUM 10.0 9.8 9.6 9.3 9.4 9.3  PHOS 5.0*  --  4.1 3.6 4.4 4.4   GFR: Estimated Creatinine Clearance: 44 mL/min (A) (by C-G formula based on SCr of 2.03 mg/dL (H)).  Liver Function Tests:  Recent Labs Lab 06/18/16 0500 06/19/16 0500 06/20/16 0450 06/21/16 0300 06/22/16 0320  ALBUMIN 2.5* 2.4* 2.5* 2.6* 2.8*    Cardiac Enzymes:  Recent Labs Lab 06/16/16 0500  CKTOTAL 112    HbA1C: Hgb A1c MFr Bld  Date/Time Value Ref Range Status  06/28/2012 02:45 PM 6.3 (H) <5.7 % Final    Comment:    (NOTE)                                                                       According to the ADA Clinical Practice Recommendations for 2011, when HbA1c is used as a screening test:  >=6.5%   Diagnostic of Diabetes Mellitus           (if abnormal result is confirmed) 5.7-6.4%   Increased risk of developing Diabetes Mellitus References:Diagnosis and Classification of Diabetes Mellitus,Diabetes Care,2011,34(Suppl 1):S62-S69 and Standards of Medical Care in         Diabetes - 2011,Diabetes RDEY,8144,81 (Suppl 1):S11-S61.  10/27/2006 03:00 PM (H)  Final   6.2 (NOTE)   The ADA recommends the following therapeutic goals for glycemic   control related to Hgb A1C measurement:   Goal of Therapy:   < 7.0% Hgb A1C   Action Suggested:  > 8.0% Hgb A1C   Ref:  Diabetes Care, 22, Suppl. 1, 1999    CBG:  Recent Labs Lab 06/21/16 1153 06/21/16 1552 06/21/16 1955 06/22/16 0007 06/22/16 0359  GLUCAP 112* 145* 105* 100* 115*    Recent Results (from the past 240 hour(s))  Culture, respiratory (NON-Expectorated)     Status: None   Collection Time: 06/13/16  8:35 PM  Result Value Ref Range Status   Specimen Description TRACHEAL ASPIRATE  Final   Special Requests NONE  Final   Gram Stain   Final    FEW WBC PRESENT,BOTH PMN AND MONONUCLEAR NO ORGANISMS SEEN    Culture Consistent with  normal  respiratory flora.  Final   Report Status 06/16/2016 FINAL  Final     Scheduled Meds: . carvedilol  3.125 mg Oral BID WC  . Chlorhexidine Gluconate Cloth  6 each Topical Daily  . colchicine  0.6 mg Oral Daily  . heparin  5,000 Units Subcutaneous Q8H  . hydrALAZINE  10 mg Oral Q8H  . isosorbide dinitrate  5 mg Oral BID  . mouth rinse  15 mL Mouth Rinse BID  . pantoprazole  40 mg Oral Q1200  . sodium chloride flush  10-40 mL Intracatheter Q12H  . tobramycin-dexamethasone   Right Eye BID     LOS: 16 days   Cherene Altes, MD [facility] Office  [phone] Pager - Text Page per Amion as per below:  On-Call/Text Page:      [email or web address]      password [email or web address]  If 7PM-7AM, please contact night-coverage [email or web address] Password [facility] 06/22/2016, 8:36 AM

## 2016-06-22 NOTE — Clinical Social Work Note (Signed)
Clinical Social Work Assessment  Patient Details  Name: [name] MRN: [id number] Date of Birth: [birth year]  Date of referral:  06/22/16               Reason for consult:  Facility Placement                Permission sought to share information with:  Facility Art therapist granted to share information::  Yes, Verbal Permission Granted  Name::     Cai Anfinson (Spouse) [phone]  Agency::     Relationship::     Contact Information:     Housing/Transportation Living arrangements for the past 2 months:  [place] of Information:  Patient, Medical Team Patient Interpreter Needed:  None Criminal Activity/Legal Involvement Pertinent to Current Situation/Hospitalization:  No - Comment as needed Significant Relationships:  Spouse, Dependent Children, Other Family Members, Friend Lives with:  Spouse, Minor Children Do you feel safe going back to the place where you live?  Yes Need for family participation in patient care:  No (Coment)  Care giving concerns:  None identifed   Facilities manager / plan: Patient is a 56 y.o.  male with history of diastolic congestive heart failure. Hemidiaphragm status post para ganglioglioma resection, CKD stage III, CAD-ICD, right corneal eye surgery. Per chart review patient lives with spouse as well as wheelchair bound teenager who is independent from wheelchair level . Reported to be independent prior to admission and still working as well as his wife works. Two-level home with bed and bathroom on first floor. CSW consult for CIR vs. SNF backup. CSW engaged with Patient at his bedside. CSW introduced self, role of CSW, and discussed physical therapy's recommendation for CIR w/ SNF backup. CSW provided Patient with list of local SNF facilities. Patient agreeable to SNF backup. CSW to facilitate referrals to SNFs. CSW also gave Patient information regarding how to apply for disability. CSW provided emotional  support and brief supportive counseling. Patient appreciative of CSW interventions. CSW continues to follow.   Employment status:  Therapist, music:  Managed Care PT Recommendations:  Inpatient Rehab Consult, 24 Hour Supervision Information / Referral to community resources:  [place]  Patient/Family's Response to care:  Patient appreciative of care received at this time.   Patient/Family's Understanding of and Emotional Response to Diagnosis, Current Treatment, and Prognosis:  Patient able to verbalize strong understanding of current diagnosis, current treatment, and prognosis. Patient reports understanding of CIR w/ SNF back up.   Emotional Assessment Appearance:  Appears stated age Attitude/Demeanor/Rapport:   (Cooperative; Engaging; Appropriate) Affect (typically observed):  Appropriate, Stable, Calm Orientation:  Oriented to Self, Oriented to Place, Oriented to  Time, Oriented to Situation Alcohol / Substance use:  Not Applicable Psych involvement (Current and /or in the community):  No (Comment)  Discharge Needs  Concerns to be addressed:  Care Coordination, Discharge Planning Concerns Readmission within the last 30 days:  No Current discharge risk:  None Barriers to Discharge:  No Barriers Identified   Lind Covert, LCSW 06/22/2016, 10:25 AM

## 2016-06-23 MED ORDER — HYDROCODONE-ACETAMINOPHEN 5-325 MG PO TABS
1.0000 | ORAL_TABLET | Freq: Four times a day (QID) | ORAL | Status: DC | PRN
  Administered 2016-06-23: 1 via ORAL
  Filled 2016-06-23: qty 1

## 2016-06-23 NOTE — Progress Notes (Signed)
Patient slept well last night. Did complain on pain on the right, upper side of his abdomen. Says it hurts sometimes and he just needed a pillow to help with the pain.

## 2016-06-23 NOTE — Clinical Social Work Note (Signed)
CSW provided patient with bed offers. CSW encouraged him to choose a facility as a back up plan in case he is unable to go to CIR. Patient expressed understanding.  [place]ton Scrape, [place]

## 2016-06-23 NOTE — Progress Notes (Signed)
Rehab admissions - I have approval for acute inpatient rehab admission from [facility] insurance carrier.  However, beds are full today.  I will follow up in am for bed availability.  Patient does want to come to inpatient rehab if we can get a bed for him.  Call me for questions.  #[phone]

## 2016-06-23 NOTE — Progress Notes (Signed)
Advanced Heart Failure Rounding Note   Primary Cardiologist: Dr. Haroldine Laws  Subjective:    Admitted 06/06/16 with acute respiratory failure requiring intubation secondary to volume overload and septic shock. 1/2 blood cultures positive for strep viridans.   Extubated 06/18/16.   Completed antibiotic course. Complaining of fatigue. Mild dyspnea with exertion.  No edema or orthopnea. Walking a little bit with PT. Very weak   Studies TEE 06/15/16 with LVEF 15%, No thrombus noted, RV mildly decreased in function, mild/mod LAE, moderate to severe MR with some restriction of posterior leaflet.  No endocarditis noted.   Objective:   Weight Range: 217 lb 6.4 oz (98.6 kg) Body mass index is 35.09 kg/m.   Vital Signs:   Temp:  [97.3 F (36.3 C)-97.9 F (36.6 C)] 97.9 F (36.6 C) (04/19 0819) Pulse Rate:  [83-90] 83 (04/19 0819) Resp:  [18-22] 20 (04/19 0819) BP: (93-107)/(43-81) 102/43 (04/19 0819) SpO2:  [99 %-100 %] 100 % (04/19 0819) Weight:  [217 lb 6.4 oz (98.6 kg)] 217 lb 6.4 oz (98.6 kg) (04/19 0659) Last BM Date: 06/22/16  Weight change: Filed Weights   06/21/16 0500 06/22/16 0600 06/23/16 0659  Weight: 219 lb 5.7 oz (99.5 kg) 216 lb 0.8 oz (98 kg) 217 lb 6.4 oz (98.6 kg)    Intake/Output:   Intake/Output Summary (Last 24 hours) at 06/23/16 0932 Last data filed at 06/23/16 0825  Gross per 24 hour  Intake              960 ml  Output             1350 ml  Net             -390 ml     Physical Exam: General:  Weak appearing. No resp difficulty. Sitting in the chair.  HEENT: normal x for opacfiied R eye Neck: supple. no JVD. Carotids 2+ bilat; no bruits. No lymphadenopathy or thryomegaly appreciated. Cor: PMI laterally displaced. RRR. No rubs, gallops or murmurs. Lungs: Decreased RML RLL on 2 liters oxygen Abdomen: soft, nontender, nondistended. No hepatosplenomegaly. No bruits or masses. Good bowel sounds. Extremities: no cyanosis, clubbing, rash, edema Neuro:  alert & orientedx3, cranial nerves grossly intact. moves all 4 extremities w/o difficulty. Affect pleasant  Telemetry: NSR A sensed V paced 80s. Personally reviewed   Labs: CBC  Recent Labs  06/21/16 0300 06/22/16 0320  WBC 13.5* 10.9*  NEUTROABS  --  8.0*  HGB 12.9* 12.4*  HCT 38.5* 37.3*  MCV 88.5 88.2  PLT 330 751   Basic Metabolic Panel  Recent Labs  06/21/16 0300 06/22/16 0320  NA 132* 131*  K 4.8 4.6  CL 96* 96*  CO2 27 25  GLUCOSE 93 94  BUN 110* 91*  CREATININE 2.10* 2.03*  CALCIUM 9.4 9.3  PHOS 4.4 4.4   Liver Function Tests  Recent Labs  06/21/16 0300 06/22/16 0320  ALBUMIN 2.6* 2.8*    BNP: BNP (last 3 results)  Recent Labs  06/06/16 1645 06/06/16 1925  BNP 796.0* 553.0*   Transthoracic Echocardiography 06/09/16 Study Conclusions  - Left ventricle: The cavity size was severely dilated. Wall   thickness was normal. The estimated ejection fraction was 20%.   Diffuse hypokinesis. - Aortic valve: There was mild regurgitation. - Mitral valve: There was moderate regurgitation. - Left atrium: The atrium was moderately dilated. - Atrial septum: No defect or patent foramen ovale was identified.  Imaging/Studies:  No results found.    Medications:  Scheduled Medications: . atorvastatin  40 mg Oral QHS  . carvedilol  3.125 mg Oral BID WC  . Chlorhexidine Gluconate Cloth  6 each Topical Daily  . colchicine  0.6 mg Oral Daily  . feeding supplement (ENSURE ENLIVE)  237 mL Oral BID BM  . heparin  5,000 Units Subcutaneous Q8H  . hydrALAZINE  10 mg Oral Q8H  . isosorbide dinitrate  5 mg Oral BID  . mouth rinse  15 mL Mouth Rinse BID  . pantoprazole  40 mg Oral Q1200  . sodium chloride flush  10-40 mL Intracatheter Q12H  . tobramycin-dexamethasone   Right Eye BID    Infusions:   PRN Medications: ALPRAZolam, fentaNYL (SUBLIMAZE) injection, guaiFENesin, sodium chloride, sodium chloride flush   Assessment/Plan   1. Septic  shock: CAP  - Strep viridans 1/2 blood cultures, follow up cultures negative.  - Per ID continue IV antibiotics 14 days from 06/09/16. Completed course.  - TEE without endocarditis or infected lead.  2. Acute hypoxic respiratory failure:  Oxygen weaned to 2 liters.  - - Asked about diaphragm plication. Per  Lorae Roig , too ill to consider this for now.  3. Acute on chronic renal failure stage III - Baseline creatinine 1.7-1.8   BMET pending. -> Creatinine today 2.03 4. Chronic systolic CHF: EF 54% on 11/12/24 - Volume status stable. Hold off on diuretics.  - Continue Coreg 6.25mg  BID - Continue hydralazine 10mg  TID.  - Continue isordil 5mg  BID -No ACE/ARB/spiro  with CKD 5. Paralyzed right hemidiaphragm - Stable.  - No change.  6. History of paraganglioma resection  Disposition: CIR with SNF for back up  Length of Stay: [place], NP  06/23/2016, 9:32 AM Advanced Heart Failure Team   Pager [phone] M-F 7am-4pm.  Please contact [facility] for night-coverage after hours (4p -7a ) and weekends on [email or web address]   Patient seen and examined with Darrick Grinder, NP. We discussed all aspects of the encounter. I agree with the assessment and plan as stated above.   Overall tenuous but stable. Remains very weak. Volume status ok. Renal function improved. Continues to do poorly with incentive spirometry. I encouraged him to work on it every hour.   He is ready for transfer to CIR.   Glori Bickers, MD  10:40 PM

## 2016-06-23 NOTE — Progress Notes (Signed)
PROGRESS NOTE        PATIENT DETAILS Name: [name] Age: 56 y.o. Sex: male Date of Birth: 1961/02/07 Admit Date: 06/06/2016 Admitting Physician Rush Farmer, MD ZES:[id number] Marigene Ehlers, MD  Brief Narrative: Patient is a 56 y.o. male with history of chronic systolic heart failure, hemidiaphragm status post paraganglioma resection, chronic kidney disease stage III admitted with acute hypoxemic respiratory failure due to decompensated heart failure. Admitted to the intensive care unit and required intubation. He was also febrile during the early part of his hospital stay, one set of blood culture positive for Streptococcus viridans bacteremia. He was subsequently stabilized, and transferred to the [facility] on 4/16. See below for further details  Significant Events: 4/2 admit - intubated 4/5 TTE - EF 20% 4/6 renal US - no hydro - 1.7cm L kidney cyst 4/10 TEE mod/severe mitral regurg - no vegetations 4/15 extubated  Subjective: Sitting up at bedside-denies any chest pain or shortness of breath. No nausea or vomiting.  Assessment/Plan: Acute hypoxemic respiratory failure: Resolved, intubated and subsequently extubated on 4/15. Etiology decompensated systolic heart failure.  Acute on chronic systolic heart failure (EF 20% on TTE on 4/5): Compensated-CHF team following-diuretics currently on hold due to ARF. Continue Coreg, hydralazine and nitrates.  Acute renal failure on chronic kidney disease stage III: Thought to be hemodynamically mediated acute kidney injury, creatinine has improved and close to usual baseline. BUN also slowly trending down. Continue to follow.  Streptococcus viridans bacteremia: Completed a two-week course of antimicrobial therapy, TEE on 4/11 without evidence of endocarditis. ID consulted-and recommendations were for 2 weeks of antimicrobial therapy which patient has completed.  Hypertension: Controlled-continue  Coreg, hydralazine, Isordil   Gout: Table-no evidence of flare-continue colchicine  History of right hemidiaphragm following paraganglioma resection  DVT Prophylaxis: Prophylactic Heparin   Code Status: Full code   Family Communication: None at bedside  Disposition Plan: Remain inpatient-hopefully CIR soon-otherwise SNF  Antimicrobial agents: Anti-infectives    Start     Dose/Rate Route Frequency Ordered Stop   06/09/16 1700  cefTRIAXone (ROCEPHIN) 2 g in dextrose 5 % 50 mL IVPB     2 g 100 mL/hr over 30 Minutes Intravenous Every 24 hours 06/09/16 1039 06/21/16 1641   06/09/16 1200  azithromycin (ZITHROMAX) 500 mg in dextrose 5 % 250 mL IVPB     500 mg 250 mL/hr over 60 Minutes Intravenous Every 24 hours 06/09/16 1039 06/13/16 1346   06/07/16 1900  vancomycin (VANCOCIN) 1,250 mg in sodium chloride 0.9 % 250 mL IVPB  Status:  Discontinued     1,250 mg 166.7 mL/hr over 90 Minutes Intravenous Every 24 hours 06/06/16 1751 06/08/16 1208   06/07/16 0200  piperacillin-tazobactam (ZOSYN) IVPB 3.375 g  Status:  Discontinued     3.375 g 12.5 mL/hr over 240 Minutes Intravenous Every 8 hours 06/06/16 1749 06/09/16 1038   06/06/16 1830  piperacillin-tazobactam (ZOSYN) IVPB 3.375 g  Status:  Discontinued     3.375 g 100 mL/hr over 30 Minutes Intravenous  Once 06/06/16 1817 06/06/16 1826   06/06/16 1830  vancomycin (VANCOCIN) IVPB 1000 mg/200 mL premix  Status:  Discontinued     1,000 mg 200 mL/hr over 60 Minutes Intravenous  Once 06/06/16 1817 06/06/16 1826   06/06/16 1800  vancomycin (VANCOCIN) 2,000 mg in sodium chloride 0.9 % 500 mL IVPB  2,000 mg 250 mL/hr over 120 Minutes Intravenous  Once 06/06/16 1748 06/06/16 2023   06/06/16 1745  piperacillin-tazobactam (ZOSYN) IVPB 3.375 g     3.375 g 100 mL/hr over 30 Minutes Intravenous  Once 06/06/16 1744 06/06/16 1823   06/06/16 1745  vancomycin (VANCOCIN) IVPB 1000 mg/200 mL premix  Status:  Discontinued     1,000 mg 200 mL/hr over  60 Minutes Intravenous  Once 06/06/16 1744 06/06/16 1748      Procedures: TEE 06/15/16 with LVEF 15%, No thrombus noted, RV mildly decreased in function, mild/mod LAE, moderate to severe MR with some restriction of posterior leaflet.   CONSULTS:  cardiology and pulmonary/intensive care  Time spent: 25- minutes-Greater than 50% of this time was spent in counseling, explanation of diagnosis, planning of further management, and coordination of care.  MEDICATIONS: Scheduled Meds: . atorvastatin  40 mg Oral QHS  . carvedilol  3.125 mg Oral BID WC  . Chlorhexidine Gluconate Cloth  6 each Topical Daily  . colchicine  0.6 mg Oral Daily  . feeding supplement (ENSURE ENLIVE)  237 mL Oral BID BM  . heparin  5,000 Units Subcutaneous Q8H  . hydrALAZINE  10 mg Oral Q8H  . isosorbide dinitrate  5 mg Oral BID  . mouth rinse  15 mL Mouth Rinse BID  . pantoprazole  40 mg Oral Q1200  . sodium chloride flush  10-40 mL Intracatheter Q12H  . tobramycin-dexamethasone   Right Eye BID   Continuous Infusions: PRN Meds:.ALPRAZolam, fentaNYL (SUBLIMAZE) injection, guaiFENesin, sodium chloride, sodium chloride flush   PHYSICAL EXAM: Vital signs: Vitals:   06/23/16 0516 06/23/16 0659 06/23/16 0819 06/23/16 1236  BP:   (!) 102/43 108/65  Pulse:   83 84  Resp:   20 20  Temp:   97.9 F (36.6 C) 97.9 F (36.6 C)  TempSrc:   Oral Oral  SpO2: 100%  100% 100%  Weight:  98.6 kg (217 lb 6.4 oz)    Height:       Filed Weights   06/21/16 0500 06/22/16 0600 06/23/16 0659  Weight: 99.5 kg (219 lb 5.7 oz) 98 kg (216 lb 0.8 oz) 98.6 kg (217 lb 6.4 oz)   Body mass index is 35.09 kg/m.   General appearance :Awake, alert, not in any distress Eyes:, pupils equally reactive to light and accomodation,no scleral icterus HEENT: Atraumatic and Normocephalic Neck: supple, no JVD. No cervical lymphadenopathy.  Resp:Good air entry bilaterally CVS: S1 S2 regular GI: Bowel sounds present, Non tender and not  distended with no gaurding, rigidity or rebound.No organomegaly Extremities: B/L Lower Ext shows no edema, both legs are warm to touch Neurology:  speech clear,Non focal, sensation is grossly intact. Musculoskeletal:No digital cyanosis Skin:No Rash, warm and dry Wounds:N/A  I have personally reviewed following labs and imaging studies  LABORATORY DATA: CBC:  Recent Labs Lab 06/17/16 0402 06/18/16 0500 06/19/16 0500 06/21/16 0300 06/22/16 0320  WBC 14.9* 19.4* 18.9* 13.5* 10.9*  NEUTROABS  --   --   --   --  8.0*  HGB 11.8* 12.5* 12.2* 12.9* 12.4*  HCT 36.3* 38.6* 37.8* 38.5* 37.3*  MCV 89.4 90.4 90.4 88.5 88.2  PLT 243 280 296 330 767    Basic Metabolic Panel:  Recent Labs Lab 06/18/16 0500 06/18/16 2058 06/19/16 0500 06/20/16 0450 06/21/16 0300 06/22/16 0320  NA 147* 144 142 136 132* 131*  K 4.6 4.4 4.2 4.1 4.8 4.6  CL 104 101 102 99* 96* 96*  CO2 29 29  30 28 27 25   GLUCOSE 155* 176* 147* 141* 93 94  BUN 159* 155* 160* 118* 110* 91*  CREATININE 2.88* 2.50* 2.25* 1.79* 2.10* 2.03*  CALCIUM 10.0 9.8 9.6 9.3 9.4 9.3  PHOS 5.0*  --  4.1 3.6 4.4 4.4    GFR: Estimated Creatinine Clearance: 44.1 mL/min (A) (by C-G formula based on SCr of 2.03 mg/dL (H)).  Liver Function Tests:  Recent Labs Lab 06/18/16 0500 06/19/16 0500 06/20/16 0450 06/21/16 0300 06/22/16 0320  ALBUMIN 2.5* 2.4* 2.5* 2.6* 2.8*   No results for input(s): LIPASE, AMYLASE in the last 168 hours. No results for input(s): AMMONIA in the last 168 hours.  Coagulation Profile: No results for input(s): INR, PROTIME in the last 168 hours.  Cardiac Enzymes: No results for input(s): CKTOTAL, CKMB, CKMBINDEX, TROPONINI in the last 168 hours.  BNP (last 3 results) No results for input(s): PROBNP in the last 8760 hours.  HbA1C: No results for input(s): HGBA1C in the last 72 hours.  CBG:  Recent Labs Lab 06/21/16 1153 06/21/16 1552 06/21/16 1955 06/22/16 0007 06/22/16 0359  GLUCAP 112*  145* 105* 100* 115*    Lipid Profile: No results for input(s): CHOL, HDL, LDLCALC, TRIG, CHOLHDL, LDLDIRECT in the last 72 hours.  Thyroid Function Tests: No results for input(s): TSH, T4TOTAL, FREET4, T3FREE, THYROIDAB in the last 72 hours.  Anemia Panel: No results for input(s): VITAMINB12, FOLATE, FERRITIN, TIBC, IRON, RETICCTPCT in the last 72 hours.  Urine analysis:    Component Value Date/Time   COLORURINE YELLOW 06/10/2016 1720   APPEARANCEUR CLEAR 06/10/2016 1720   LABSPEC 1.009 06/10/2016 1720   PHURINE 8.0 06/10/2016 1720   GLUCOSEU NEGATIVE 06/10/2016 1720   HGBUR NEGATIVE 06/10/2016 1720   BILIRUBINUR NEGATIVE 06/10/2016 1720   BILIRUBINUR small 06/28/2012 1212   KETONESUR NEGATIVE 06/10/2016 1720   PROTEINUR NEGATIVE 06/10/2016 1720   UROBILINOGEN 0.2 02/12/2014 2054   NITRITE NEGATIVE 06/10/2016 1720   LEUKOCYTESUR NEGATIVE 06/10/2016 1720    Sepsis Labs: Lactic Acid, Venous    Component Value Date/Time   LATICACIDVEN 1.8 06/06/2016 2116    MICROBIOLOGY: Recent Results (from the past 240 hour(s))  Culture, respiratory (NON-Expectorated)     Status: None   Collection Time: 06/13/16  8:35 PM  Result Value Ref Range Status   Specimen Description TRACHEAL ASPIRATE  Final   Special Requests NONE  Final   Gram Stain   Final    FEW WBC PRESENT,BOTH PMN AND MONONUCLEAR NO ORGANISMS SEEN    Culture Consistent with normal respiratory flora.  Final   Report Status 06/16/2016 FINAL  Final    RADIOLOGY STUDIES/RESULTS: US Renal  Result Date: 06/10/2016 CLINICAL DATA:  Acute kidney injury EXAM: RENAL / URINARY TRACT ULTRASOUND COMPLETE COMPARISON:  CT 05/26/2015 FINDINGS: Right Kidney: Length: 10.8 cm. Echogenicity within normal limits. No mass or hydronephrosis visualized. Left Kidney: Length: 11.4 cm. Echogenicity within normal limits. No hydronephrosis. 1.7 x 1.6 x 1.2 cm hypoechoic probable cyst in the mid left kidney Bladder: Appears normal for degree of  bladder distention. Prostate slightly enlarged and measures 5 x 2.8 x 4.5 cm IMPRESSION: 1. Negative for hydronephrosis 2. 1.7 cm cyst in the left kidney Electronically Signed   By: Donavan Foil M.D.   On: 06/10/2016 22:28   Dg Chest Port 1 View  Result Date: 06/20/2016 CLINICAL DATA:  Sudden SOB with right rib pain tonight, r/o pneumothorax. EXAM: PORTABLE CHEST 1 VIEW COMPARISON:  Chest x-rays dated 06/18/2016 and 06/08/2016. FINDINGS: Cardiomegaly appears  stable. Again noted is central pulmonary vascular congestion without overt alveolar pulmonary edema. No new lung findings. No large pleural effusion. No pneumothorax seen. Left chest wall pacemaker in place. Right IJ central line appears stable in position with tip at the level of the mid SVC. Old healed fractures within the right ribs. No acute fracture or dislocation seen. IMPRESSION: 1. No acute findings.  No pneumothorax seen. 2. Stable cardiomegaly with central pulmonary vascular congestion suggesting mild CHF/volume overload. No evidence of overt alveolar pulmonary edema. No evidence of pneumonia or large pleural effusion. Electronically Signed   By: Franki Cabot M.D.   On: 06/20/2016 18:41   Dg Chest Port 1 View  Result Date: 06/18/2016 CLINICAL DATA:  Extubated. EXAM: PORTABLE CHEST 1 VIEW COMPARISON:  06/17/2016 FINDINGS: Left subclavian AICD device and leads are stable. Endotracheal tube removed. Feeding tube stable. Right jugular central venous catheter stable. Low volumes. Hazy airspace disease bilaterally and low lung volumes have worsened. Right-sided rib deformities are noted and unchanged. No pneumothorax. IMPRESSION: Increasing bilateral central and basilar opacity likely due to stable airspace disease but worsening volume loss. Electronically Signed   By: Marybelle Killings M.D.   On: 06/18/2016 13:48   Dg Chest Port 1 View  Result Date: 06/17/2016 CLINICAL DATA:  56 year old male with a history of ventilation EXAM: PORTABLE CHEST 1  VIEW COMPARISON:  06/16/2016, 06/15/2016 FINDINGS: Cardiomediastinal silhouette unchanged in size and contour with cardiomegaly. Cardiac AICD on the left chest wall unchanged. Unchanged position of right IJ central venous catheter, endotracheal tube, and enteric feeding tube terminating out of the field of view. Low lung volumes with minimal interstitial opacities. No pneumothorax. No large pleural effusion. IMPRESSION: Re- demonstration of cardiomegaly and minimal interstitial opacities. Unchanged endotracheal tube, enteric feeding tube, and right IJ central catheter. Electronically Signed   By: Corrie Mckusick D.O.   On: 06/17/2016 14:11   Dg Chest Port 1 View  Result Date: 06/16/2016 CLINICAL DATA:  Intubation, on ventilator EXAM: PORTABLE CHEST 1 VIEW COMPARISON:  Portable chest x-ray of 06/15/2016 FINDINGS: The tip of the endotracheal tube is approximately 5.1 cm above the carina. Cardiomegaly is stable and there still mild pulmonary vascular congestion present. No pleural effusion is seen. AICD leads remain. NG tube extends below the hemidiaphragm. Right central venous line tip overlies the lower SVC. IMPRESSION: 1. Tip of endotracheal tube approximately 5.1 cm above the carina. 2. Cardiomegaly and mild pulmonary vascular congestion remain. Electronically Signed   By: Ivar Drape M.D.   On: 06/16/2016 11:14   Dg Chest Port 1 View  Result Date: 06/15/2016 CLINICAL DATA:  56 year old male status post endotracheal tube placement on ventilator. EXAM: PORTABLE CHEST 1 VIEW COMPARISON:  Chest x-ray 06/13/2016. FINDINGS: An endotracheal tube is in place with tip 4.1 cm above the carina. There is a right-sided internal jugular central venous catheter with tip terminating in the superior cavoatrial junction. Left-sided pacemaker/AICD with lead tips projecting over the expected location of the right atrium and right ventricular apex. Low lung volumes. Improved aeration throughout the lungs bilaterally with  resolution of much of the previously noted multifocal interstitial and airspace disease, presumably reflective of resolving edema and/or multifocal pneumonia. Trace right pleural effusion. No left pleural effusion. Cephalization of the pulmonary vasculature, crowding of the pulmonary vasculature, likely accentuated by the low lung volumes. Heart size remains mildly enlarged. Upper mediastinal contours are within normal limits. Multiple surgical clips project over the thoracic inlet and the medial upper right hemithorax. Multiple old  healed right-sided rib fractures. IMPRESSION: 1. Support apparatus, as above. 2. Overall, there is significant improvement compared to the examination from 2 days ago with near complete resolution of edema and/or multilobar pneumonia seen on the prior study. 3. Mild cardiomegaly. 4. Trace right pleural effusion. Electronically Signed   By: Vinnie Langton M.D.   On: 06/15/2016 13:41   Dg Chest Port 1 View  Result Date: 06/13/2016 CLINICAL DATA:  Worse part or if failure.  Shortness of breath. EXAM: PORTABLE CHEST 1 VIEW COMPARISON:  One-view chest x-ray 06/10/2016 FINDINGS: The heart is enlarged. Patient remains intubated. The endotracheal tube is stable. NG tube courses off the inferior border the film. A right IJ line is in place. Interstitial edema and mild patchy airspace disease is stable. IMPRESSION: 1. Mild edema and patchy airspace disease is stable. 2. Support apparatus is stable. Electronically Signed   By: San Morelle M.D.   On: 06/13/2016 07:14   Dg Chest Port 1 View  Result Date: 06/10/2016 CLINICAL DATA:  Central line placement. EXAM: PORTABLE CHEST 1 VIEW COMPARISON:  Earlier film, same date. FINDINGS: Right IJ center venous catheter tipa is in the distal SVC. No complicating features. The other support apparatus is stable. Mild edema and atelectasis and probable small left effusion. IMPRESSION: Right IJ center venous catheter tip is in the distal SVC. No  complicating features. Electronically Signed   By: Marijo Sanes M.D.   On: 06/10/2016 19:08   Dg Chest Port 1 View  Result Date: 06/10/2016 CLINICAL DATA:  Central line placement. EXAM: PORTABLE CHEST 1 VIEW COMPARISON:  Chest x-ray 06/09/2016 FINDINGS: The endotracheal tube is 3.6 cm above the carina. The NG tube is coursing down the esophagus and into the stomach. The pacer wires are stable. No central line is identified. The heart is enlarged but stable. Improved lung aeration with resolving edema and atelectasis. Probable left pleural effusion. IMPRESSION: No central line is identified. Stable endotracheal tube, NG tube and pacer wires. Improved lung aeration. Electronically Signed   By: Marijo Sanes M.D.   On: 06/10/2016 17:46   Dg Chest Port 1 View  Result Date: 06/09/2016 CLINICAL DATA:  Respiratory failure requiring intubation EXAM: PORTABLE CHEST 1 VIEW COMPARISON:  Chest x-ray of 06/09/2016 FINDINGS: The tip of the endotracheal tube is approximately 2.0 cm above the carina. AICD and pacer leads remain. Cardiomegaly is stable. OG tube extends into the stomach. There does appear to be pulmonary vascular congestion present. No pleural effusion is seen. IMPRESSION: 1. Tip of endotracheal tube 2.0 cm above the carina. 2. Cardiomegaly and pulmonary vascular congestion. 3. OG tube extends into the stomach, with the tip not visualized. Electronically Signed   By: Ivar Drape M.D.   On: 06/09/2016 11:59   Dg Chest Port 1 View  Result Date: 06/09/2016 CLINICAL DATA:  Daily check for Vent and CVL patient. EXAM: PORTABLE CHEST 1 VIEW COMPARISON:  06/08/2016 FINDINGS: Patient's endotracheal tube, tip 2.4 cm above the carina. A nasogastric tube is in place with tip beyond the gastroesophageal junction on the film. I such as his pacemaker leads overlie the right ventricle and right atrium. Heart is enlarged. There is mild bibasilar airspace filling. There has been some improvement in aeration of the left lung  base with the left hemidiaphragm more apparent. Multiple old rib fractures. IMPRESSION: Slight improvement in aeration of left lung base. Persistent bibasilar airspace filling. Electronically Signed   By: Nolon Nations M.D.   On: 06/09/2016 08:55   Dg  Chest Port 1 View  Result Date: 06/08/2016 CLINICAL DATA:  Respiratory failure, intubated patient. EXAM: PORTABLE CHEST 1 VIEW COMPARISON:  Portable chest x-ray of June 07, 2016. FINDINGS: The lungs are adequately inflated. There is minimal bibasilar density greatest on the left which has improved since yesterday's study. The cardiac silhouette remains enlarged. The pulmonary vascularity is less prominent. The ICD is in stable position. The endotracheal tube tip lies 5 cm above the carina. The esophagogastric tube tip projects below the inferior margin of the image. Old fractures of posterior right fifth and sixth and seventh ribs are demonstrated. IMPRESSION: Slight improvement in bibasilar atelectasis or pneumonia. The support tubes are in reasonable position. Electronically Signed   By: David  Martinique M.D.   On: 06/08/2016 07:01   Dg Chest Port 1 View  Result Date: 06/07/2016 CLINICAL DATA:  Acute onset shortness of breath 06/06/2016. Patient intubated. EXAM: PORTABLE CHEST 1 VIEW COMPARISON:  Single-view of the chest 06/06/2016 and CT chest 04/08/2015. FINDINGS: Support tubes and lines are unchanged and project in good position. There is cardiomegaly and vascular congestion. Bibasilar airspace disease is worse on the left. Multiple remote right rib fractures are again seen. IMPRESSION: ETT and NG tube project in good position. Left worse than right basilar airspace disease could be due to atelectasis or pneumonia. No change in cardiomegaly and vascular congestion. Electronically Signed   By: Inge Rise M.D.   On: 06/07/2016 08:59   Dg Chest Portable 1 View  Result Date: 06/06/2016 CLINICAL DATA:  Respiratory failure, post intubation EXAM: PORTABLE  CHEST 1 VIEW COMPARISON:  06/06/2016 CXR at 1641 hours FINDINGS: New endotracheal tube tip is noted 3 cm above the carina. The cardiac silhouette is stable and mildly enlarged in appearance as before. Aorta is not aneurysmal. There is mild central vascular congestion. ICD device projects over the left shoulder with single pacing lead in the right atrium and pacing and defibrillator leads in the right ventricle. Chronic right fifth and seventh rib fractures with callus formation. No pulmonary consolidation or effusion. No pneumothorax. Gastric tube extends into the body of the stomach. IMPRESSION: 1. Satisfactory support line and tube positions. 2. Low lung volumes with mild vascular congestion and stable cardiomegaly. 3. ICD device in place. Electronically Signed   By: Ashley Royalty M.D.   On: 06/06/2016 17:51   Dg Chest Port 1 View  Result Date: 06/06/2016 CLINICAL DATA:  Shortness of breath. History of congestive heart failure, hypertension and sleep apnea. EXAM: PORTABLE CHEST 1 VIEW COMPARISON:  Radiographs 04/08/2016.  CT 04/08/2015. FINDINGS: 1641 hours. Left subclavian AICD leads appear unchanged. There is stable cardiomegaly and chronic right basilar scarring. Old rib fractures are present on the right. No evidence of edema, confluent airspace opacity, pleural effusion or pneumothorax. IMPRESSION: Stable post operative chest.  No acute cardiopulmonary process. Electronically Signed   By: Richardean Sale M.D.   On: 06/06/2016 16:56   Dg Abd Portable 1v  Result Date: 06/15/2016 CLINICAL DATA:  Feeding tube placement. EXAM: PORTABLE ABDOMEN - 1 VIEW COMPARISON:  Supine abdominal radiograph of June 09, 2016 FINDINGS: The radiodense feeding tube has replaced the esophagogastric tube. The tip of the feeding tube lies in the region of the third portion of the duodenum. IMPRESSION: The feeding tube tip now lies in the region of the third portion of the duodenum. Electronically Signed   By: David  Martinique M.D.    On: 06/15/2016 17:15   Dg Abd Portable 1v  Result Date: 06/09/2016 CLINICAL  DATA:  Respiratory failure requiring intubation EXAM: PORTABLE ABDOMEN - 1 VIEW COMPARISON:  CT abdomen pelvis of 05/26/2015 FINDINGS: A portable supine film of the abdomen shows some air in the colon but no bowel obstruction is seen. An OG tube is present with the tip in the region of duodenal bulb or descending duodenum. No opaque calculi are seen. IMPRESSION: 1. OG tube tip in the region of the duodenal bulb or descending duodenum. 2. Some colonic gas but no bowel obstruction is seen. Electronically Signed   By: Ivar Drape M.D.   On: 06/09/2016 12:00     LOS: 17 days   Oren Binet, MD  [facility] Pager:[phone]  If 7PM-7AM, please contact night-coverage [email or web address] Password [facility] 06/23/2016, 12:52 PM

## 2016-06-24 ENCOUNTER — Inpatient Hospital Stay (HOSPITAL_COMMUNITY)
Admission: RE | Admit: 2016-06-24 | Discharge: 2016-07-01 | DRG: 945 | Disposition: A | Discharge status: Home Health | Payer: 59 | Source: Intra-hospital | Attending: Physical Medicine & Rehabilitation | Admitting: Physical Medicine & Rehabilitation

## 2016-06-24 DIAGNOSIS — N182 Chronic kidney disease, stage 2 (mild): Secondary | ICD-10-CM | POA: Diagnosis not present

## 2016-06-24 DIAGNOSIS — I130 Hypertensive heart and chronic kidney disease with heart failure and stage 1 through stage 4 chronic kidney disease, or unspecified chronic kidney disease: Secondary | ICD-10-CM | POA: Diagnosis present

## 2016-06-24 DIAGNOSIS — Z79899 Other long term (current) drug therapy: Secondary | ICD-10-CM | POA: Diagnosis not present

## 2016-06-24 DIAGNOSIS — E875 Hyperkalemia: Secondary | ICD-10-CM

## 2016-06-24 DIAGNOSIS — R5381 Other malaise: Secondary | ICD-10-CM

## 2016-06-24 DIAGNOSIS — N183 Chronic kidney disease, stage 3 (moderate): Secondary | ICD-10-CM | POA: Diagnosis present

## 2016-06-24 DIAGNOSIS — R071 Chest pain on breathing: Secondary | ICD-10-CM | POA: Diagnosis present

## 2016-06-24 DIAGNOSIS — I2510 Atherosclerotic heart disease of native coronary artery without angina pectoris: Secondary | ICD-10-CM | POA: Diagnosis present

## 2016-06-24 DIAGNOSIS — N179 Acute kidney failure, unspecified: Secondary | ICD-10-CM | POA: Diagnosis not present

## 2016-06-24 DIAGNOSIS — Z95810 Presence of automatic (implantable) cardiac defibrillator: Secondary | ICD-10-CM | POA: Diagnosis not present

## 2016-06-24 DIAGNOSIS — E871 Hypo-osmolality and hyponatremia: Secondary | ICD-10-CM

## 2016-06-24 DIAGNOSIS — E785 Hyperlipidemia, unspecified: Secondary | ICD-10-CM | POA: Diagnosis present

## 2016-06-24 DIAGNOSIS — I952 Hypotension due to drugs: Secondary | ICD-10-CM

## 2016-06-24 DIAGNOSIS — M109 Gout, unspecified: Secondary | ICD-10-CM | POA: Diagnosis present

## 2016-06-24 DIAGNOSIS — I5042 Chronic combined systolic (congestive) and diastolic (congestive) heart failure: Secondary | ICD-10-CM | POA: Diagnosis present

## 2016-06-24 DIAGNOSIS — I5023 Acute on chronic systolic (congestive) heart failure: Secondary | ICD-10-CM | POA: Diagnosis not present

## 2016-06-24 DIAGNOSIS — E039 Hypothyroidism, unspecified: Secondary | ICD-10-CM | POA: Diagnosis present

## 2016-06-24 DIAGNOSIS — Z888 Allergy status to other drugs, medicaments and biological substances status: Secondary | ICD-10-CM | POA: Diagnosis not present

## 2016-06-24 DIAGNOSIS — D62 Acute posthemorrhagic anemia: Secondary | ICD-10-CM | POA: Diagnosis present

## 2016-06-24 DIAGNOSIS — I4729 Other ventricular tachycardia: Secondary | ICD-10-CM

## 2016-06-24 DIAGNOSIS — G4730 Sleep apnea, unspecified: Secondary | ICD-10-CM | POA: Diagnosis present

## 2016-06-24 DIAGNOSIS — Z9981 Dependence on supplemental oxygen: Secondary | ICD-10-CM

## 2016-06-24 DIAGNOSIS — I959 Hypotension, unspecified: Secondary | ICD-10-CM | POA: Diagnosis not present

## 2016-06-24 DIAGNOSIS — R740 Nonspecific elevation of levels of transaminase and lactic acid dehydrogenase [LDH]: Secondary | ICD-10-CM | POA: Diagnosis not present

## 2016-06-24 DIAGNOSIS — I10 Essential (primary) hypertension: Secondary | ICD-10-CM | POA: Diagnosis not present

## 2016-06-24 DIAGNOSIS — I420 Dilated cardiomyopathy: Secondary | ICD-10-CM | POA: Diagnosis present

## 2016-06-24 DIAGNOSIS — Z9049 Acquired absence of other specified parts of digestive tract: Secondary | ICD-10-CM

## 2016-06-24 DIAGNOSIS — I472 Ventricular tachycardia: Secondary | ICD-10-CM

## 2016-06-24 LAB — CBC
HCT: 37.4 % — ABNORMAL LOW (ref 39.0–52.0)
HEMOGLOBIN: 12.4 g/dL — AB (ref 13.0–17.0)
MCH: 29.5 pg (ref 26.0–34.0)
MCHC: 33.2 g/dL (ref 30.0–36.0)
MCV: 89 fL (ref 78.0–100.0)
Platelets: 279 10*3/uL (ref 150–400)
RBC: 4.2 MIL/uL — ABNORMAL LOW (ref 4.22–5.81)
RDW: 14.2 % (ref 11.5–15.5)
WBC: 10.1 10*3/uL (ref 4.0–10.5)

## 2016-06-24 LAB — BASIC METABOLIC PANEL
Anion gap: 10 (ref 5–15)
BUN: 61 mg/dL — AB (ref 6–20)
CALCIUM: 9.5 mg/dL (ref 8.9–10.3)
CO2: 23 mmol/L (ref 22–32)
CREATININE: 1.81 mg/dL — AB (ref 0.61–1.24)
Chloride: 99 mmol/L — ABNORMAL LOW (ref 101–111)
GFR calc Af Amer: 46 mL/min — ABNORMAL LOW (ref >60)
GFR, EST NON AFRICAN AMERICAN: 40 mL/min — AB (ref >60)
GLUCOSE: 109 mg/dL — AB (ref 65–99)
Potassium: 5.3 mmol/L — ABNORMAL HIGH (ref 3.5–5.1)
SODIUM: 132 mmol/L — AB (ref 135–145)

## 2016-06-24 LAB — CREATININE, SERUM
CREATININE: 1.85 mg/dL — AB (ref 0.61–1.24)
GFR calc Af Amer: 45 mL/min — ABNORMAL LOW (ref >60)
GFR calc non Af Amer: 39 mL/min — ABNORMAL LOW (ref >60)

## 2016-06-24 MED ORDER — HEPARIN SODIUM (PORCINE) 5000 UNIT/ML IJ SOLN
5000.0000 [IU] | Freq: Once | INTRAMUSCULAR | Status: AC
  Administered 2016-06-24: 5000 [IU] via SUBCUTANEOUS

## 2016-06-24 MED ORDER — COLCHICINE 0.6 MG PO TABS: 0.6000 mg | ORAL_TABLET | Freq: Every day | ORAL | Status: DC

## 2016-06-24 MED ORDER — HYDROCODONE-ACETAMINOPHEN 5-325 MG PO TABS
1.0000 | ORAL_TABLET | Freq: Four times a day (QID) | ORAL | Status: DC | PRN
  Administered 2016-06-24 – 2016-06-27 (×4): 1 via ORAL
  Administered 2016-06-28 (×2): 0.5 via ORAL
  Filled 2016-06-24 (×6): qty 1

## 2016-06-24 MED ORDER — TORSEMIDE 20 MG PO TABS: 20.0000 mg | ORAL_TABLET | Freq: Every day | ORAL | Status: DC

## 2016-06-24 MED ORDER — ISOSORBIDE DINITRATE 5 MG PO TABS
5.0000 mg | ORAL_TABLET | Freq: Two times a day (BID) | ORAL | Status: DC
  Administered 2016-06-24 – 2016-07-01 (×11): 5 mg via ORAL
  Filled 2016-06-24 (×14): qty 1

## 2016-06-24 MED ORDER — CARVEDILOL 3.125 MG PO TABS
3.1250 mg | ORAL_TABLET | Freq: Two times a day (BID) | ORAL | Status: DC
  Administered 2016-06-24 – 2016-06-30 (×10): 3.125 mg via ORAL
  Filled 2016-06-24 (×12): qty 1

## 2016-06-24 MED ORDER — HEPARIN SODIUM (PORCINE) 5000 UNIT/ML IJ SOLN
5000.0000 [IU] | Freq: Three times a day (TID) | INTRAMUSCULAR | Status: DC
  Administered 2016-06-24 – 2016-07-01 (×20): 5000 [IU] via SUBCUTANEOUS
  Filled 2016-06-24 (×21): qty 1

## 2016-06-24 MED ORDER — HYDRALAZINE HCL 10 MG PO TABS: 10.0000 mg | ORAL_TABLET | Freq: Three times a day (TID) | ORAL | Status: DC

## 2016-06-24 MED ORDER — SODIUM POLYSTYRENE SULFONATE 15 GM/60ML PO SUSP
30.0000 g | Freq: Once | ORAL | Status: AC
  Administered 2016-06-24: 30 g via ORAL
  Filled 2016-06-24: qty 120

## 2016-06-24 MED ORDER — ENSURE ENLIVE PO LIQD
237.0000 mL | Freq: Two times a day (BID) | ORAL | Status: DC
  Administered 2016-06-25 – 2016-07-01 (×4): 237 mL via ORAL

## 2016-06-24 MED ORDER — HEPARIN SODIUM (PORCINE) 5000 UNIT/ML IJ SOLN: 5000.0000 [IU] | Freq: Three times a day (TID) | INTRAMUSCULAR | Status: DC

## 2016-06-24 MED ORDER — ATORVASTATIN CALCIUM 40 MG PO TABS
40.0000 mg | ORAL_TABLET | Freq: Every day | ORAL | Status: DC
  Administered 2016-06-24 – 2016-06-30 (×7): 40 mg via ORAL
  Filled 2016-06-24 (×7): qty 1

## 2016-06-24 MED ORDER — HYDRALAZINE HCL 10 MG PO TABS
10.0000 mg | ORAL_TABLET | Freq: Once | ORAL | Status: AC
  Administered 2016-06-24: 10 mg via ORAL

## 2016-06-24 MED ORDER — PANTOPRAZOLE SODIUM 40 MG PO TBEC: 40.0000 mg | DELAYED_RELEASE_TABLET | Freq: Every day | ORAL | Status: DC

## 2016-06-24 MED ORDER — PANTOPRAZOLE SODIUM 40 MG PO TBEC
40.0000 mg | DELAYED_RELEASE_TABLET | Freq: Every day | ORAL | Status: DC
  Administered 2016-06-25 – 2016-06-30 (×6): 40 mg via ORAL
  Filled 2016-06-24 (×6): qty 1

## 2016-06-24 MED ORDER — ENSURE ENLIVE PO LIQD: 237.0000 mL | Freq: Two times a day (BID) | ORAL | 12 refills | Status: DC

## 2016-06-24 MED ORDER — GUAIFENESIN ER 600 MG PO TB12
600.0000 mg | ORAL_TABLET | Freq: Two times a day (BID) | ORAL | Status: DC | PRN
  Administered 2016-06-29: 600 mg via ORAL
  Filled 2016-06-24: qty 1

## 2016-06-24 MED ORDER — COLCHICINE 0.6 MG PO TABS
0.6000 mg | ORAL_TABLET | Freq: Every day | ORAL | Status: DC
  Administered 2016-06-24 – 2016-07-01 (×8): 0.6 mg via ORAL
  Filled 2016-06-24 (×8): qty 1

## 2016-06-24 MED ORDER — SALINE SPRAY 0.65 % NA SOLN
1.0000 | NASAL | Status: DC | PRN
  Administered 2016-07-01: 1 via NASAL
  Filled 2016-06-24: qty 44

## 2016-06-24 MED ORDER — SORBITOL 70 % SOLN: 30.0000 mL | Freq: Every day | Status: DC | PRN

## 2016-06-24 MED ORDER — ONDANSETRON HCL 4 MG PO TABS: 4.0000 mg | ORAL_TABLET | Freq: Four times a day (QID) | ORAL | Status: DC | PRN

## 2016-06-24 MED ORDER — TOBRAMYCIN-DEXAMETHASONE 0.3-0.1 % OP OINT
TOPICAL_OINTMENT | Freq: Two times a day (BID) | OPHTHALMIC | Status: DC
  Administered 2016-06-24 – 2016-06-26 (×4): via OPHTHALMIC
  Administered 2016-06-26: 1 via OPHTHALMIC
  Administered 2016-06-27 – 2016-07-01 (×9): via OPHTHALMIC
  Filled 2016-06-24: qty 3.5

## 2016-06-24 MED ORDER — ISOSORBIDE DINITRATE 5 MG PO TABS: 5.0000 mg | ORAL_TABLET | Freq: Two times a day (BID) | ORAL | Status: DC

## 2016-06-24 MED ORDER — HYDRALAZINE HCL 10 MG PO TABS
10.0000 mg | ORAL_TABLET | Freq: Three times a day (TID) | ORAL | Status: DC
  Administered 2016-06-24 – 2016-07-01 (×9): 10 mg via ORAL
  Filled 2016-06-24 (×18): qty 1

## 2016-06-24 MED ORDER — TORSEMIDE 10 MG PO TABS: 20.0000 mg | ORAL_TABLET | Freq: Every day | ORAL | Status: DC

## 2016-06-24 MED ORDER — ONDANSETRON HCL 4 MG/2ML IJ SOLN: 4.0000 mg | Freq: Four times a day (QID) | INTRAMUSCULAR | Status: DC | PRN

## 2016-06-24 MED ORDER — CARVEDILOL 3.125 MG PO TABS: 3.1250 mg | ORAL_TABLET | Freq: Two times a day (BID) | ORAL | Status: DC

## 2016-06-24 MED ORDER — ALPRAZOLAM 0.25 MG PO TABS: 0.2500 mg | ORAL_TABLET | Freq: Three times a day (TID) | ORAL | Status: DC | PRN

## 2016-06-24 NOTE — Progress Notes (Signed)
Rehab admissions - I have approval and I have a bed for patient today.  I have talked to attending MD and have medical clearance.  I will admit to acute inpatient rehab today.  Call me for questions.  #[phone]

## 2016-06-24 NOTE — Progress Notes (Signed)
Ankit Lorie Phenix, MD Physician Signed Physical Medicine and Rehabilitation  Consult Note Date of Service: 06/22/2016 9:27 AM  Related encounter: ED to Hosp-Admission (Current) from 06/06/2016 in [facility]     Expand All Collapse All   [] Hide copied text [] Hover for attribution information      Physical Medicine and Rehabilitation Consult Reason for Consult: Debilitation related to CHF exacerbation with sepsis Referring Physician: [facility]   HPI: [name] is a 56 y.o. right handed male with history of diastolic congestive heart failure. Hemidiaphragm status post para ganglioglioma resection, CKD stage III, CAD-ICD, right corneal eye surgery. Per chart review patient lives with spouse as well as wheelchair bound teenager who is independent from wheelchair level . Reported to be independent prior to admission and still working as well as his wife works. Two-level home with bed and bathroom on first floor. He presented 06/06/2016 with increasing shortness of breath and diaphoresis. Oxygen saturations 83%. Troponin negative. Noted creatinine of 2.40 from baseline 1.71. Hyperkalemia 6.4. Chest x-ray negative for acute process. Initially treated for CHF exacerbation with BiPAP, IV Lasix. Patient later required intubation for respiratory distress. Blood cultures grew strep viridans in one of 2 bottles. Maintain on broad-spectrum antibiotics. Echocardiogram with ejection fraction of 20% diffuse hypokinesis no PFO. TEE moderate to severe mitral regurgitation no vegetation. Renal ultrasound negative for hydronephrosis with follow-up per renal services and maintain on gentle IV fluids was creatinine improved 2.03. Patient remained intubated until 06/19/2016. Subcutaneous heparin added for DVT prophylaxis. Currently completing a course of intravenous Rocephin for CAP. Diet has been advanced to a regular consistency. Physical therapy evaluation completed and ongoing with  recommendations of physical medicine rehabilitation consult.   Review of Systems  Constitutional: Negative for chills and fever.  HENT: Negative for hearing loss and tinnitus.   Eyes: Positive for blurred vision. Negative for double vision.  Respiratory: Positive for shortness of breath and wheezing.   Cardiovascular: Positive for palpitations. Negative for chest pain.  Gastrointestinal: Positive for constipation. Negative for nausea and vomiting.  Genitourinary: Positive for urgency. Negative for dysuria, flank pain and hematuria.  Musculoskeletal: Positive for myalgias.  Skin: Negative for rash.  Neurological: Positive for weakness. Negative for seizures.  All other systems reviewed and are negative.      Past Medical History:  Diagnosis Date  . Arthritis    GOUT  . Automatic implantable cardioverter-defibrillator in situ   . Biventricular ICD (implantable cardiac defibrillator) Medtronic ]    DOI 2008/ upgrade 2010/ Gen Change 2014  . Bladder tumor    PT HOSP AT [facility] 4/24 TO 4/26 2014 WITH UTI--AND FOUND TO HAVE BLADDER TUMOR-  . Cardiomyopathy    Idiopathic dilated;   . CHF (congestive heart failure) ([place])   . CKD (chronic kidney disease)    stage III baseline Crt 1.8-2.0  . Coronary artery disease   . Gout 08/03/12   PT C/O OF RIGHT KNEE PAIN AND SWELLING -STATES GOUT FLARE UP - ONGOING SINCE APRIL - BUT SWELLING W/IN LAST WEEK  . Hilar mass    Noted CT 2010  . HTN (hypertension)    Dr. Caryl Comes cardiologist  . Hypothyroidism   . 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 Sleep apnea    CPAP  . Systolic CHF ([place])    EF 25%  . Ventricular tachycardia ([place])    s/p ICD        Past  Surgical History:  Procedure Laterality Date  . BIV ICD GENERTAOR CHANGE OUT     DOI 2008/ upgrade 2010/ Gen Change 2014   . CARDIAC CATHETERIZATION     he was found to have normal coronary arteries but with  a globally dilated and hypocontractile heart  . CARDIAC DEFIBRILLATOR PLACEMENT     DOI 2008/ upgrade 2010/ Gen Change 2014   . CHEST TUBE INSERTION  09/10/2013  . CHOLECYSTECTOMY    . cornea replacement    . ENDOBRONCHIAL ULTRASOUND Bilateral 10/01/2012   Procedure: ENDOBRONCHIAL ULTRASOUND;  Surgeon: Collene Gobble, MD;  Location: [facility] ENDOSCOPY;  Service: Cardiopulmonary;  Laterality: Bilateral;  . IMPLANTABLE CARDIOVERTER DEFIBRILLATOR (ICD) GENERATOR CHANGE N/A 05/02/2012   Procedure: ICD GENERATOR CHANGE;  Surgeon: Deboraha Sprang, MD;  Location: [facility] CATH LAB;  Service: Cardiovascular;  Laterality: N/A;  . MEDIASTINOSCOPY N/A 10/25/2012   Procedure: MEDIASTINOSCOPY;  Surgeon: [name], MD;  Location: [place];  Service: Thoracic;  Laterality: N/A;  . MEDIASTINOTOMY  09/10/2013   paratracheal mass    DR Darcey Nora  . MEDIASTINOTOMY CHAMBERLAIN MCNEIL Right 09/10/2013   Procedure: MEDIASTINOTOMY CHAMBERLAIN MCNEIL;  Surgeon: [name], MD;  Location: [place];  Service: Thoracic;  Laterality: Right;  . RESECTION OF MEDIASTINAL MASS Right 02/13/2014   Procedure: RESECTION OF MEDIASTINAL MASS;  Surgeon: [name], MD;  Location: [facility] OR;  Service: Thoracic;  Laterality: Right;  PATIENT NEEDS EPIDURAL CATHETER AND A SWAN GANZ CATHETER (DR. CHRIS MOSER AWARE PER PVT)  . RIGHT HEART CATHETERIZATION N/A 01/27/2012   Procedure: RIGHT HEART CATH;  Surgeon: Jolaine Artist, MD;  Location: [facility] CATH LAB;  Service: Cardiovascular;  Laterality: N/A;  . TONSILLECTOMY    . TRANSURETHRAL RESECTION OF BLADDER TUMOR N/A 08/13/2012   Procedure: TRANSURETHRAL RESECTION OF BLADDER TUMOR (TURBT);  Surgeon: Claybon Jabs, MD;  Location: [facility] ORS;  Service: Urology;  Laterality: N/A;  MITOMYCIN C  . US ECHOCARDIOGRAPHY  03-17-2008, 07-03-2006   EF 15-20%, EF 15-20%  . VIDEO ASSISTED THORACOSCOPY (VATS)/THOROCOTOMY Right 02/13/2014   Procedure: VIDEO ASSISTED THORACOSCOPY  (VATS)/THOROCOTOMY;  Surgeon: [name], MD;  Location: [facility] OR;  Service: Thoracic;  Laterality: Right;  PATIENT NEEDS EPIDURAL CATHETER (DR. CHRIS MOSER AWARE PER PVT)  . VIDEO BRONCHOSCOPY N/A 10/25/2012   Procedure: VIDEO BRONCHOSCOPY;  Surgeon: [name], MD;  Location: [facility] OR;  Service: Thoracic;  Laterality: N/A;        Family History  Problem Relation Age of Onset  . Family history unknown: Yes   Social History:  reports that he has never smoked. He has never used smokeless tobacco. He reports that he does not drink alcohol or use drugs. Allergies:       Allergies  Allergen Reactions  . Bidil [Isosorb Dinitrate-Hydralazine] Other (See Comments)    Headaches   . Spironolactone Other (See Comments)    Hyperkalemia   . Hydralazine Hcl Other (See Comments)    "Fuzzy headed" feeling         Medications Prior to Admission  Medication Sig Dispense Refill  . atorvastatin (LIPITOR) 40 MG tablet TAKE 1 TABLET BY MOUTH AT BEDTIME 30 tablet 2  . BREO ELLIPTA 100-25 MCG/INH AEPB Inhale 1 puff into the lungs daily as needed (for shortness of breath).   11  . carvedilol (COREG) 12.5 MG tablet Take 6.25 mg by mouth 2 (two) times daily with a meal.     . gabapentin (NEURONTIN) 300 MG capsule Take 300  mg by mouth 2 (two) times daily. Pain    . hydrALAZINE (APRESOLINE) 50 MG tablet Take 1 tablet (50 mg total) by mouth 2 (two) times daily. (Patient taking differently: Take 25 mg by mouth 2 (two) times daily. ) 180 tablet 3  . HYDROcodone-acetaminophen (NORCO/VICODIN) 5-325 MG tablet Take 0.5 tablets by mouth 2 (two) times daily as needed (for pain).     . magnesium oxide (MAG-OX) 400 MG tablet Take two tablets (800 mg) by mouth twice daily 120 tablet 11  . omeprazole (PRILOSEC) 20 MG capsule Take 20 mg by mouth at bedtime.    Marland Kitchen tobramycin-dexamethasone (TOBRADEX) ophthalmic solution Place 1 drop into the right eye 2 (two) times daily.    Marland Kitchen torsemide (DEMADEX) 10  MG tablet Take 10 mg by mouth daily. Or as directed by your physician    . MITIGARE 0.6 MG CAPS Take 0.6 mg by mouth daily. (Patient not taking: Reported on 06/07/2016) 30 capsule 6    Home: Home Living Family/patient expects to be discharged to:: Private residence Living Arrangements: Spouse/significant other, Children Available Help at Discharge: Family, Available 24 hours/day Type of Home: House Home Access: Stairs to enter [employer] of Steps: 2 Entrance Stairs-Rails: None Home Layout: Two level, 1/2 bath on main level, Able to live on main level with bedroom/bathroom Alternate Level Stairs-Number of Steps: 12 Alternate Level Stairs-Rails: Right Home Equipment: Cane - quad, Shower seat - built in Additional Comments: Per pt, child is in a wheelchair.  They have to assist child with B/D but once they get child in wheelchair he is Modif I in the home and they both work.   Functional History: Prior Function Level of Independence: Independent Functional Status:  Mobility: Bed Mobility Overal bed mobility: Needs Assistance Bed Mobility: Rolling, Sidelying to Sit Rolling: Min assist Sidelying to sit: Mod assist, +2 for physical assistance Supine to sit: +2 for physical assistance, Mod assist Sit to supine: Mod assist, +2 for physical assistance General bed mobility comments: Pt up in chair Transfers Overall transfer level: Needs assistance Equipment used: Rolling walker (2 wheeled) Transfer via Lift Equipment: [place] Transfers: Sit to/from Stand Sit to Stand: +2 physical assistance, Mod assist Squat pivot transfers: Max assist, +2 physical assistance General transfer comment: Assist to bring hips a;nd trunk up.  Verbal cues for hand placement Ambulation/Gait Ambulation/Gait assistance: Mod assist, +2 safety/equipment Ambulation Distance (Feet): 10 Feet (10' x 1, 5' x 1) Assistive device: Rolling walker (2 wheeled) Gait Pattern/deviations: Step-through pattern,  Decreased step length - right, Decreased step length - left, Shuffle, Trunk flexed General Gait Details: Assist for balance and support. Verbal cues to stand more upright Gait velocity: decr Gait velocity interpretation: Below normal speed for age/gender  ADL:  Cognition: Cognition Overall Cognitive Status: Within Functional Limits for tasks assessed Orientation Level: Oriented X4 Cognition Arousal/Alertness: Awake/alert Behavior During Therapy: WFL for tasks assessed/performed Overall Cognitive Status: Within Functional Limits for tasks assessed Difficult to assess due to: Intubated  Blood pressure 100/84, pulse 76, temperature 97.3 F (36.3 C), temperature source Oral, resp. rate (!) 21, height 5\' 6"  (1.676 m), weight 98 kg (216 lb 0.8 oz), SpO2 100 %. Physical Exam  Vitals reviewed. Constitutional: He is oriented to person, place, and time. He appears well-developed.  Obese  HENT:  Head: Normocephalic and atraumatic.  Eyes: Conjunctivae are normal. Right eye exhibits no discharge. Left eye exhibits no discharge.  Right eye with decreased vision  Neck: Normal range of motion. Neck supple. No thyromegaly  present.  Cardiovascular: Normal rate and regular rhythm.   Respiratory: Effort normal.  Fair inspiratory effort without wheeze  GI: Soft. Bowel sounds are normal. He exhibits no distension.  Musculoskeletal: He exhibits no edema or tenderness.  Neurological: He is alert and oriented to person, place, and time.  Follow full commands Sensation intact to light touch Motor: 4-4+/5 throughout  Skin: Skin is warm and dry.  Psychiatric: He has a normal mood and affect. His behavior is normal. Thought content normal.    Lab Results Last 24 Hours       Results for orders placed or performed during the hospital encounter of 06/06/16 (from the past 24 hour(s))  Glucose, capillary     Status: Abnormal   Collection Time: 06/21/16 11:53 AM  Result Value Ref Range    Glucose-Capillary 112 (H) 65 - 99 mg/dL   Comment 1 Capillary Specimen   Glucose, capillary     Status: Abnormal   Collection Time: 06/21/16  3:52 PM  Result Value Ref Range   Glucose-Capillary 145 (H) 65 - 99 mg/dL   Comment 1 Capillary Specimen   Glucose, capillary     Status: Abnormal   Collection Time: 06/21/16  7:55 PM  Result Value Ref Range   Glucose-Capillary 105 (H) 65 - 99 mg/dL   Comment 1 Notify RN   Glucose, capillary     Status: Abnormal   Collection Time: 06/22/16 12:07 AM  Result Value Ref Range   Glucose-Capillary 100 (H) 65 - 99 mg/dL   Comment 1 Notify RN   Renal function panel     Status: Abnormal   Collection Time: 06/22/16  3:20 AM  Result Value Ref Range   Sodium 131 (L) 135 - 145 mmol/L   Potassium 4.6 3.5 - 5.1 mmol/L   Chloride 96 (L) 101 - 111 mmol/L   CO2 25 22 - 32 mmol/L   Glucose, Bld 94 65 - 99 mg/dL   BUN 91 (H) 6 - 20 mg/dL   Creatinine, Ser 2.03 (H) 0.61 - 1.24 mg/dL   Calcium 9.3 8.9 - 10.3 mg/dL   Phosphorus 4.4 2.5 - 4.6 mg/dL   Albumin 2.8 (L) 3.5 - 5.0 g/dL   GFR calc non Af Amer 35 (L) >60 mL/min   GFR calc Af Amer 40 (L) >60 mL/min   Anion gap 10 5 - 15  CBC with Differential/Platelet     Status: Abnormal   Collection Time: 06/22/16  3:20 AM  Result Value Ref Range   WBC 10.9 (H) 4.0 - 10.5 K/uL   RBC 4.23 4.22 - 5.81 MIL/uL   Hemoglobin 12.4 (L) 13.0 - 17.0 g/dL   HCT 37.3 (L) 39.0 - 52.0 %   MCV 88.2 78.0 - 100.0 fL   MCH 29.3 26.0 - 34.0 pg   MCHC 33.2 30.0 - 36.0 g/dL   RDW 14.0 11.5 - 15.5 %   Platelets 324 150 - 400 K/uL   Neutrophils Relative % 74 %   Neutro Abs 8.0 (H) 1.7 - 7.7 K/uL   Lymphocytes Relative 13 %   Lymphs Abs 1.5 0.7 - 4.0 K/uL   Monocytes Relative 9 %   Monocytes Absolute 1.0 0.1 - 1.0 K/uL   Eosinophils Relative 4 %   Eosinophils Absolute 0.4 0.0 - 0.7 K/uL   Basophils Relative 0 %   Basophils Absolute 0.0 0.0 - 0.1 K/uL  Glucose, capillary      Status: Abnormal   Collection Time: 06/22/16  3:59 AM  Result Value Ref Range   Glucose-Capillary 115 (H) 65 - 99 mg/dL   Comment 1 Notify RN       Imaging Results (Last 48 hours)  Dg Chest Port 1 View  Result Date: 06/20/2016 CLINICAL DATA:  Sudden SOB with right rib pain tonight, r/o pneumothorax. EXAM: PORTABLE CHEST 1 VIEW COMPARISON:  Chest x-rays dated 06/18/2016 and 06/08/2016. FINDINGS: Cardiomegaly appears stable. Again noted is central pulmonary vascular congestion without overt alveolar pulmonary edema. No new lung findings. No large pleural effusion. No pneumothorax seen. Left chest wall pacemaker in place. Right IJ central line appears stable in position with tip at the level of the mid SVC. Old healed fractures within the right ribs. No acute fracture or dislocation seen. IMPRESSION: 1. No acute findings.  No pneumothorax seen. 2. Stable cardiomegaly with central pulmonary vascular congestion suggesting mild CHF/volume overload. No evidence of overt alveolar pulmonary edema. No evidence of pneumonia or large pleural effusion. Electronically Signed   By: Franki Cabot M.D.   On: 06/20/2016 18:41     Assessment/Plan: Diagnosis:Debilitation  Labs and images independently reviewed.  Records reviewed and summated above.  1. Does the need for close, 24 hr/day medical supervision in concert with the patient's rehab needs make it unreasonable for this patient to be served in a less intensive setting? Yes 2. Co-Morbidities requiring supervision/potential complications: combined congestive heart failure  (Monitor in accordance with increased physical activity and avoid UE resistance excercises), Hemidiaphragm status post para ganglioglioma resection (monitor O2 sats and RR with increased exertion), CKD stage III (avoid nephrotoxic meds), CAD-ICD, right corneal eye surgery, mitral regurgitation (see CHF), CAP (cont abx), tachypnea, (monitor RR and O2 Sats with increased physical  exertion), hyponatremia (cont to monitor, treat if necessary), ABLA (transfuse if necessary to ensure appropriate perfusion for increased activity tolerance), leukocytosis (cont to monitor for signs and symptoms of infection, further workup if indicated) 3. Due to safety, disease management and patient education, does the patient require 24 hr/day rehab nursing? Yes 4. Does the patient require coordinated care of a physician, rehab nurse, PT (1-2 hrs/day, 5 days/week) and OT (1-2 hrs/day, 5 days/week) to address physical and functional deficits in the context of the above medical diagnosis(es)? Yes Addressing deficits in the following areas: balance, endurance, locomotion, strength, transferring, bathing, dressing, toileting and psychosocial support 5. Can the patient actively participate in an intensive therapy program of at least 3 hrs of therapy per day at least 5 days per week? Yes 6. The potential for patient to make measurable gains while on inpatient rehab is excellent 7. Anticipated functional outcomes upon discharge from inpatient rehab are modified independent and supervision  with PT, modified independent and supervision with OT, n/a with SLP. 8. Estimated rehab length of stay to reach the above functional goals is: 8-13 days. 9. Does the patient have adequate social supports and living environment to accommodate these discharge functional goals? Yes 10. Anticipated D/C setting: Home 11. Anticipated post D/C treatments: HH therapy and Home excercise program 12. Overall Rehab/Functional Prognosis: good  RECOMMENDATIONS: This patient's condition is appropriate for continued rehabilitative care in the following setting: CIR Patient has agreed to participate in recommended program. Yes Note that insurance prior authorization may be required for reimbursement for recommended care.  Comment: Rehab Admissions Coordinator to follow up.  Delice Lesch, MD, Mellody Drown Cathlyn Parsons.,  PA-C 06/22/2016    Revision History  Routing History                    

## 2016-06-24 NOTE — Clinical Social Work Note (Signed)
Patient discharging to CIR today.  CSW signing off.   Shantika Bermea, CSW [phone]  

## 2016-06-24 NOTE — Progress Notes (Signed)
Physical Therapy Treatment Patient Details Name: [name] MRN: [id number] DOB: 1961-01-23 Today's Date: 06/24/2016    History of Present Illness 82M w/ PMHx of CHF, gout, cardiomyopathy, hemidiaphragm s/p paraganglioma resection, CKD, CAD, ICD, and hypothyroidism who presents w/ SOB who was initially tx for CHF exacerbation in the ED and then worked up for sepsis. Intubated 4/2. Extubated 4/15.    PT Comments    Patient progressing with ambulation distance and endurance, able to complete some standing therex, but fatigues quickly.  Will need CIR rehab prior to d/c home.    Follow Up Recommendations  CIR;Supervision/Assistance - 24 hour     Equipment Recommendations  Rolling walker with 5" wheels;3in1 (PT)    Recommendations for Other Services       Precautions / Restrictions Precautions Precautions: Fall Precaution Comments: O2 dependent    Mobility  Bed Mobility               General bed mobility comments: up in chair  Transfers Overall transfer level: Needs assistance Equipment used: Rolling walker (2 wheeled) Transfers: Sit to/from Stand Sit to Stand: Min assist         General transfer comment: assist for balance and some lifting from low seat with no armrests  Ambulation/Gait Ambulation/Gait assistance: Min assist;+2 safety/equipment Ambulation Distance (Feet): 70 Feet (x 2) Assistive device: Rolling walker (2 wheeled) Gait Pattern/deviations: Step-through pattern;Decreased stride length;Trunk flexed     General Gait Details: cues for posture, proximity to walker, seated rest needed in hallway prior to return to room, on 2L O2 throughout   Stairs            Wheelchair Mobility    Modified Rankin (Stroke Patients Only)       Balance Overall balance assessment: Needs assistance Sitting-balance support: No upper extremity supported;Feet supported Sitting balance-Leahy Scale: Good     Standing balance support: Bilateral upper  extremity supported Standing balance-Leahy Scale: Poor Standing balance comment: dependent on UE support                            Cognition Arousal/Alertness: Awake/alert Behavior During Therapy: WFL for tasks assessed/performed Overall Cognitive Status: Within Functional Limits for tasks assessed                                        Exercises General Exercises - Lower Extremity Hip Flexion/Marching: Strengthening;10 reps;Both;Standing Heel Raises: Strengthening;Both;Standing;10 reps Mini-Sqauts: Strengthening;Both;10 reps;Standing Other Exercises Other Exercises: seated scapular squeezes 5 sec hold x 10 with rolled blanket at spine Other Exercises: cervical AROM flex/ext and rotation x 5 each    General Comments        Pertinent Vitals/Pain Pain Score: 6  Pain Location: R flank Pain Descriptors / Indicators: Sharp Pain Intervention(s): Monitored during session;Repositioned    Home Living                      Prior Function            PT Goals (current goals can now be found in the care plan section) Progress towards PT goals: Progressing toward goals    Frequency    Min 3X/week      PT Plan Current plan remains appropriate    Co-evaluation             End of Session Equipment  Utilized During Treatment: Gait belt;Oxygen Activity Tolerance: Patient tolerated treatment well Patient left: in chair;with call bell/phone within reach   PT Visit Diagnosis: Unsteadiness on feet (R26.81);Muscle weakness (generalized) (M62.81)     Time: 1898-4210 PT Time Calculation (min) (ACUTE ONLY): 27 min  Charges:  $Gait Training: 8-22 mins $Therapeutic Exercise: 8-22 mins                    G CodesMagda Kiel, [place] [phone] 06/24/2016    Reginia Naas 06/24/2016, 1:32 PM

## 2016-06-24 NOTE — H&P (Signed)
Physical Medicine and Rehabilitation Admission H&P    Chief Complaint  Patient presents with  . Shortness of Breath  : HPI: [name] is a 56 y.o. right handed male with history of diastolic congestive heart failure. Hemidiaphragm status post para ganglioglioma resection, CKD stage III, CAD-ICD, right corneal eye surgery. Per chart review patient lives with spouse as well as wheelchair bound teenager who is independent from wheelchair level . Reported to be independent prior to admission and still working as well as his wife works. Two-level home with bed and bathroom on first floor. He presented 06/06/2016 with increasing shortness of breath and diaphoresis. Oxygen saturations 83%. Troponin negative. Noted creatinine of 2.40 from baseline 1.71. Hyperkalemia 6.4. Chest x-ray negative for acute process. Initially treated for CHF exacerbation with BiPAP, IV Lasix. Patient later required intubation for respiratory distress. Blood cultures grew strep viridans in one of 2 bottles. Maintain on broad-spectrum antibiotics transitioned to Rocephin and antibiotic therapies have been completed.. Echocardiogram with ejection fraction of 20% diffuse hypokinesis no PFO. TEE moderate to severe mitral regurgitation no vegetation. Renal ultrasound negative for hydronephrosis with follow-up per renal services and maintain on gentle IV fluids with creatinine improved 1.81. Patient remained intubated until 06/19/2016. Subcutaneous heparin added for DVT prophylaxis. . Diet has been advanced to a regular consistency. Physical and occupational therapy evaluation completed and ongoing with recommendations of physical medicine rehabilitation consult. Patient was admitted for a comprehensive rehabilitation program  Review of Systems  Constitutional: Negative for chills and fever.  HENT: Negative for hearing loss.   Eyes: Positive for blurred vision.  Respiratory: Positive for shortness of breath. Negative for cough.    Cardiovascular: Positive for palpitations and leg swelling. Negative for chest pain.  Gastrointestinal: Positive for constipation. Negative for nausea and vomiting.  Genitourinary: Positive for urgency. Negative for dysuria and hematuria.  Musculoskeletal: Positive for joint pain and myalgias.  Skin: Negative for rash.  Neurological: Positive for weakness. Negative for seizures.  All other systems reviewed and are negative.  Past Medical History:  Diagnosis Date  . Arthritis    GOUT  . Automatic implantable cardioverter-defibrillator in situ   . Biventricular ICD (implantable cardiac defibrillator) [employer] ]    DOI 2008/ upgrade 2010/ Gen Change 2014  . Bladder tumor    PT HOSP AT [facility] 4/24 TO 4/26 2014 WITH UTI--AND FOUND TO HAVE BLADDER TUMOR-  . Cardiomyopathy    Idiopathic dilated;   . CHF (congestive heart failure) ([place])   . CKD (chronic kidney disease)    stage III baseline Crt 1.8-2.0  . Coronary artery disease   . Gout 08/03/12   PT C/O OF RIGHT KNEE PAIN AND SWELLING -STATES GOUT FLARE UP - ONGOING SINCE APRIL - BUT SWELLING W/IN LAST WEEK  . Hilar mass    Noted CT 2010  . HTN (hypertension)    Dr. Caryl Comes cardiologist  . Hypothyroidism   . 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 Sleep apnea    CPAP  . Systolic CHF ([place])    EF 67%  . Ventricular tachycardia [facility])    s/p ICD   Past Surgical History:  Procedure Laterality Date  . BIV ICD GENERTAOR CHANGE OUT     DOI 2008/ upgrade 2010/ Gen Change 2014   . CARDIAC CATHETERIZATION     he was found to have normal coronary arteries but with a globally dilated and hypocontractile heart  . CARDIAC DEFIBRILLATOR  PLACEMENT     DOI 2008/ upgrade 2010/ Gen Change 2014   . CHEST TUBE INSERTION  09/10/2013  . CHOLECYSTECTOMY    . cornea replacement    . ENDOBRONCHIAL ULTRASOUND Bilateral 10/01/2012   Procedure: ENDOBRONCHIAL ULTRASOUND;  Surgeon: Collene Gobble, MD;   Location: [facility] ENDOSCOPY;  Service: Cardiopulmonary;  Laterality: Bilateral;  . IMPLANTABLE CARDIOVERTER DEFIBRILLATOR (ICD) GENERATOR CHANGE N/A 05/02/2012   Procedure: ICD GENERATOR CHANGE;  Surgeon: Deboraha Sprang, MD;  Location: [facility] CATH LAB;  Service: Cardiovascular;  Laterality: N/A;  . MEDIASTINOSCOPY N/A 10/25/2012   Procedure: MEDIASTINOSCOPY;  Surgeon: [name], MD;  Location: [place];  Service: Thoracic;  Laterality: N/A;  . MEDIASTINOTOMY  09/10/2013   paratracheal mass    DR Darcey Nora  . MEDIASTINOTOMY CHAMBERLAIN MCNEIL Right 09/10/2013   Procedure: MEDIASTINOTOMY CHAMBERLAIN MCNEIL;  Surgeon: [name], MD;  Location: [place];  Service: Thoracic;  Laterality: Right;  . RESECTION OF MEDIASTINAL MASS Right 02/13/2014   Procedure: RESECTION OF MEDIASTINAL MASS;  Surgeon: [name], MD;  Location: [facility] OR;  Service: Thoracic;  Laterality: Right;  PATIENT NEEDS EPIDURAL CATHETER AND A SWAN GANZ CATHETER (DR. CHRIS MOSER AWARE PER PVT)  . RIGHT HEART CATHETERIZATION N/A 01/27/2012   Procedure: RIGHT HEART CATH;  Surgeon: Jolaine Artist, MD;  Location: [facility] CATH LAB;  Service: Cardiovascular;  Laterality: N/A;  . TONSILLECTOMY    . TRANSURETHRAL RESECTION OF BLADDER TUMOR N/A 08/13/2012   Procedure: TRANSURETHRAL RESECTION OF BLADDER TUMOR (TURBT);  Surgeon: Claybon Jabs, MD;  Location: [facility] ORS;  Service: Urology;  Laterality: N/A;  MITOMYCIN C  . US ECHOCARDIOGRAPHY  03-17-2008, 07-03-2006   EF 15-20%, EF 15-20%  . VIDEO ASSISTED THORACOSCOPY (VATS)/THOROCOTOMY Right 02/13/2014   Procedure: VIDEO ASSISTED THORACOSCOPY (VATS)/THOROCOTOMY;  Surgeon: [name], MD;  Location: [facility] OR;  Service: Thoracic;  Laterality: Right;  PATIENT NEEDS EPIDURAL CATHETER (DR. CHRIS MOSER AWARE PER PVT)  . VIDEO BRONCHOSCOPY N/A 10/25/2012   Procedure: VIDEO BRONCHOSCOPY;  Surgeon: [name], MD;  Location: [facility] OR;  Service: Thoracic;  Laterality: N/A;   Family History  Problem  Relation Age of Onset  . Family history unknown: Yes   Social History:  reports that he has never smoked. He has never used smokeless tobacco. He reports that he does not drink alcohol or use drugs. Allergies:  Allergies  Allergen Reactions  . Bidil [Isosorb Dinitrate-Hydralazine] Other (See Comments)    Headaches   . Spironolactone Other (See Comments)    Hyperkalemia   . Hydralazine Hcl Other (See Comments)    "Fuzzy headed" feeling   Medications Prior to Admission  Medication Sig Dispense Refill  . atorvastatin (LIPITOR) 40 MG tablet TAKE 1 TABLET BY MOUTH AT BEDTIME 30 tablet 2  . BREO ELLIPTA 100-25 MCG/INH AEPB Inhale 1 puff into the lungs daily as needed (for shortness of breath).   11  . carvedilol (COREG) 12.5 MG tablet Take 6.25 mg by mouth 2 (two) times daily with a meal.     . gabapentin (NEURONTIN) 300 MG capsule Take 300 mg by mouth 2 (two) times daily. Pain    . hydrALAZINE (APRESOLINE) 50 MG tablet Take 1 tablet (50 mg total) by mouth 2 (two) times daily. (Patient taking differently: Take 25 mg by mouth 2 (two) times daily. ) 180 tablet 3  . HYDROcodone-acetaminophen (NORCO/VICODIN) 5-325 MG tablet Take 0.5 tablets by mouth 2 (two) times daily as needed (for pain).     Marland Kitchen  magnesium oxide (MAG-OX) 400 MG tablet Take two tablets (800 mg) by mouth twice daily 120 tablet 11  . omeprazole (PRILOSEC) 20 MG capsule Take 20 mg by mouth at bedtime.    Marland Kitchen tobramycin-dexamethasone (TOBRADEX) ophthalmic solution Place 1 drop into the right eye 2 (two) times daily.    Marland Kitchen torsemide (DEMADEX) 10 MG tablet Take 10 mg by mouth daily. Or as directed by your physician    . MITIGARE 0.6 MG CAPS Take 0.6 mg by mouth daily. (Patient not taking: Reported on 06/07/2016) 30 capsule 6    Home: Home Living Family/patient expects to be discharged to:: Private residence Living Arrangements: Spouse/significant other, Children Available Help at Discharge: Family, Available 24 hours/day Type of Home:  House Home Access: Stairs to enter [employer] of Steps: 2 Entrance Stairs-Rails: None Home Layout: Two level, 1/2 bath on main level, Able to live on main level with bedroom/bathroom Alternate Level Stairs-Number of Steps: 12 Alternate Level Stairs-Rails: Right Home Equipment: Cane - quad, Shower seat - built in Additional Comments: Per pt, child is in a wheelchair.  They have to assist child with B/D but once they get child in wheelchair he is Modif I in the home and they both work.    Functional History: Prior Function Level of Independence: Independent  Functional Status:  Mobility: Bed Mobility Overal bed mobility: Needs Assistance Bed Mobility: Sit to Supine Rolling: Min assist Sidelying to sit: Mod assist, +2 for physical assistance Supine to sit: +2 for physical assistance, Mod assist Sit to supine: Min guard General bed mobility comments: increased time but able to complete with v/c's for sequencing/direction but no physical assist needed Transfers Overall transfer level: Needs assistance Equipment used: Rolling walker (2 wheeled) Transfer via Lift Equipment: [employer] Transfers: Sit to/from Stand Sit to Stand: Min assist Squat pivot transfers: Max assist, +2 physical assistance General transfer comment: v/c's for hand placement to push off arm rests, increased time Ambulation/Gait Ambulation/Gait assistance: Min assist, +2 safety/equipment Ambulation Distance (Feet): 50 Feet (x2) Assistive device: Rolling walker (2 wheeled) Gait Pattern/deviations: Step-through pattern, Trunk flexed General Gait Details: v/c's for upright posture and decreased UE dependence. required 5 min seated rest break between bouts Gait velocity: decreased Gait velocity interpretation: Below normal speed for age/gender    ADL:    Cognition: Cognition Overall Cognitive Status: Within Functional Limits for tasks assessed Orientation Level: Oriented  X4 Cognition Arousal/Alertness: Awake/alert Behavior During Therapy: WFL for tasks assessed/performed Overall Cognitive Status: Within Functional Limits for tasks assessed Difficult to assess due to: Intubated  Physical Exam: Blood pressure 122/89, pulse 87, temperature 97.8 F (36.6 C), temperature source Oral, resp. rate 20, height _0  (1.676 m), weight 97.7 kg (215 lb 6.4 oz), SpO2 100 %. Physical Exam  Constitutional:  obese  HENT:  Head: Normocephalic.  Eyes:  Right eye decreased vision, opaque  Neck: Neck supple. No thyromegaly present.  Cardiovascular: Normal rate and regular rhythm.   No murmur heard. Respiratory: Effort normal and breath sounds normal. No respiratory distress. He has no wheezes. He has no rales.  GI: Soft. Bowel sounds are normal. He exhibits no distension. There is no tenderness. There is no rebound.  Skin warm and dry. A few ecchmoses Ext: 1+ edema bilateral LE Neurological: He is alert and oriented to person, place, and time.  Follow full commands Sensation intact to light touch Motor: 4-4+/5 bilateral UE's. LE: 4- hf, 4/5 ke and 4/5 adf/pf.  Psych: pleasant, a bit flat. cooperative   Results  for orders placed or performed during the hospital encounter of 06/06/16 (from the past 48 hour(s))  Basic metabolic panel     Status: Abnormal   Collection Time: 06/24/16  3:33 AM  Result Value Ref Range   Sodium 132 (L) 135 - 145 mmol/L   Potassium 5.3 (H) 3.5 - 5.1 mmol/L   Chloride 99 (L) 101 - 111 mmol/L   CO2 23 22 - 32 mmol/L   Glucose, Bld 109 (H) 65 - 99 mg/dL   BUN 61 (H) 6 - 20 mg/dL   Creatinine, Ser 1.81 (H) 0.61 - 1.24 mg/dL   Calcium 9.5 8.9 - 10.3 mg/dL   GFR calc non Af Amer 40 (L) >60 mL/min   GFR calc Af Amer 46 (L) >60 mL/min    Comment: (NOTE) The eGFR has been calculated using the CKD EPI equation. This calculation has not been validated in all clinical situations. eGFR's persistently <60 mL/min signify possible Chronic  Kidney Disease.    Anion gap 10 5 - 15   No results found.     Medical Problem List and Plan: 1.  Debilitation secondary to CHF exacerbation with sepsis  -admit to inpatient rehab 2.  DVT Prophylaxis/Anticoagulation: Subcutaneous heparin. Monitor for any bleeding episodes. Patient is ambulatory 3. Pain Management: Hydrocodone as needed 4. Mood: Xanax 0.25-0.5 mg 3 times a day as needed 5. Neuropsych: This patient is capable of making decisions on his own behalf. 6. Skin/Wound Care: Routine skin checks 7. Fluids/Electrolytes/Nutrition: Routine I&O with follow-up chemistries upon admit 8. Hypertension. Coreg 3.125 mg twice a day, hydralazine 10 mg 3 times a day, Isordil 5 mg twice a day 9. CKD stage III. Follow-up chemistries as indicated  -follow I's and O's 10. CAD-ICD. No chest pain or shortness of breath 11. ID. Streptococcus viridans bacteremia. Completed anti-microbial antibiotic therapy. 12. Hyperlipidemia. Lipitor 13. History of gout. Colchicine 0.6 mg daily. Monitor for any flareup   Post Admission Physician Evaluation: 1. Functional deficits secondary  to debility after multiple medical. 2. Patient is admitted to receive collaborative, interdisciplinary care between the physiatrist, rehab nursing staff, and therapy team. 3. Patient's level of medical complexity and substantial therapy needs in context of that medical necessity cannot be provided at a lesser intensity of care such as a SNF. 4. Patient has experienced substantial functional loss from his/her baseline which was documented above under the "Functional History" and "Functional Status" headings.  Judging by the patient's diagnosis, physical exam, and functional history, the patient has potential for functional progress which will result in measurable gains while on inpatient rehab.  These gains will be of substantial and practical use upon discharge  in facilitating mobility and self-care at the household  level. 5. Physiatrist will provide 24 hour management of medical needs as well as oversight of the therapy plan/treatment and provide guidance as appropriate regarding the interaction of the two. 6. The Preadmission Screening has been reviewed and patient status is unchanged unless otherwise stated above. 7. 24 hour rehab nursing will assist with bladder management, bowel management, safety, skin/wound care, disease management, medication administration, pain management and patient education  and help integrate therapy concepts, techniques,education, etc. 8. PT will assess and treat for/with: Lower extremity strength, range of motion, stamina, balance, functional mobility, safety, adaptive techniques and equipment, pain control, community reintegration.   Goals are: mod I. 9. OT will assess and treat for/with: ADL's, functional mobility, safety, upper extremity strength, adaptive techniques and equipment, pain mgt, family ed, ego support, community  reintegration.   Goals are: mod I. Therapy may proceed with showering this patient. 10. SLP will assess and treat for/with: n/a.  Goals are: n/a. 11. Case Management and Social Worker will assess and treat for psychological issues and discharge planning. 12. Team conference will be held weekly to assess progress toward goals and to determine barriers to discharge. 13. Patient will receive at least 3 hours of therapy per day at least 5 days per week. 14. ELOS: 7-10 days       15. Prognosis:  excellent     Meredith Staggers, MD, [facility] 06/24/2016  Cathlyn Parsons., PA-C 06/24/2016

## 2016-06-24 NOTE — Progress Notes (Signed)
At 1525 pt transferred to 4W in patient rehab.  Karie Kirks, Therapist, sports.

## 2016-06-24 NOTE — Discharge Summary (Signed)
PATIENT DETAILS Name: [name] Age: 56 y.o. Sex: male Date of Birth: 1960-04-17 MRN: [id number]. Admitting Physician: Rush Farmer, MD YSA:[id number],FUXNA Marigene Ehlers, MD  Admit Date: 06/06/2016 Discharge date: 06/24/2016  Recommendations for Outpatient Follow-up:  1. Follow up with PCP in 1-2 weeks 2. Follow chemistry panel closely- diuretics restarted on 4/20 3. Will need occasional follow-up by cardiology/CHF team while at [place]  Admitted From:  Home  Disposition: [place]: No  Equipment/Devices: None  Discharge Condition: Stable  CODE STATUS: FULL CODE  Diet recommendation:  Heart Healthy   Brief Summary: See H&P, Labs, Consult and Test reports for all details in brief,Patient is a 56 y.o. male with history of chronic systolic heart failure, hemidiaphragm status post paraganglioma resection, chronic kidney disease stage III admitted with acute hypoxemic respiratory failure due to decompensated heart failure. Admitted to the intensive care unit and required intubation. He was also febrile during the early part of his hospital stay, one set of blood culture positive for Streptococcus viridans bacteremia. He was subsequently stabilized, and transferred to the [facility] hospitalist service on 4/16. See below for further details  Significant Events: 4/2 admit - intubated 4/5 TTE - EF 20% 4/6 renal US - no hydro - 1.7cm L kidney cyst 4/10 TEE mod/severe mitral regurg - no vegetations 4/15 extubated   Brief Hospital Course: Acute hypoxemic respiratory failure: Resolved, intubated and subsequently extubated on 4/15. Etiology decompensated systolic heart failure.  Acute on chronic systolic heart failure (EF 20% on TTE on 4/5): Compensated-CHF team following-diuretics were initially on hold due to ARF. This has been restarted on the day of discharge-continue to follow volume status closely. Continue Coreg, hydralazine and nitrates. CHF team will continue to follow  while at [facility] with nurse practitioner Jettie Booze.  Acute renal failure on chronic kidney disease stage III: Thought to be hemodynamically mediated acute kidney injury, creatinine has improved and close to usual baseline. BUN also slowly trending down. Continue to follow.  Streptococcus viridans bacteremia: Completed a two-week course of antimicrobial therapy, TEE on 4/11 without evidence of endocarditis. ID consulted-and recommendations were for 2 weeks of antimicrobial therapy which patient has completed.  Hypertension: Controlled-continue Coreg, hydralazine, Isordil   Gout: Table-no evidence of flare-continue colchicine  History of right hemidiaphragm following paraganglioma resection  Procedures/Studies: TEE 06/15/16 with LVEF 15%, No thrombus noted, RV mildly decreased in function, mild/mod LAE, moderate to severe MR with some restriction of posterior leaflet.   Discharge Diagnoses:  Active Problems:   SYSTOLIC HEART FAILURE, CHRONIC   Automatic implantable cardioverter-defibrillator in situ   Diaphragm paralysis   Acute respiratory failure with hypoxia (HCC)   Pressure injury of skin   Acute on chronic respiratory failure with hypoxemia (HCC)   Bacteremia due to Streptococcus   Central line infection   Infected defibrillator [facility])   History of ETT   Acute on chronic combined systolic and diastolic CHF (congestive heart failure) (HCC)   Coronary artery disease involving native coronary artery of native heart without angina pectoris   Community acquired pneumonia   Tachypnea   Acute blood loss anemia   Discharge Instructions:  Activity:  As tolerated with Full fall precautions use walker/cane & assistance as needed   Discharge Instructions    (HEART FAILURE PATIENTS) Call MD:  Anytime you have any of the following symptoms: 1) 3 pound weight gain in 24 hours or 5 pounds in 1 week 2) shortness of breath, with or without a dry hacking cough  3) swelling in the hands,  feet or stomach 4) if you have to sleep on extra pillows at night in order to breathe.    Complete by:  As directed    Diet - low sodium heart healthy    Complete by:  As directed    Increase activity slowly    Complete by:  As directed      Allergies as of 06/24/2016      Reactions   Bidil [isosorb Dinitrate-hydralazine] Other (See Comments)   Headaches   Spironolactone Other (See Comments)   Hyperkalemia   Hydralazine Hcl Other (See Comments)   "Fuzzy headed" feeling      Medication List    STOP taking these medications   MITIGARE 0.6 MG Caps Generic drug:  Colchicine Replaced by:  colchicine 0.6 MG tablet     TAKE these medications   atorvastatin 40 MG tablet Commonly known as:  LIPITOR TAKE 1 TABLET BY MOUTH AT BEDTIME   BREO ELLIPTA 100-25 MCG/INH Aepb Generic drug:  fluticasone furoate-vilanterol Inhale 1 puff into the lungs daily as needed (for shortness of breath).   carvedilol 3.125 MG tablet Commonly known as:  COREG Take 1 tablet (3.125 mg total) by mouth 2 (two) times daily with a meal. What changed:  medication strength  how much to take   colchicine 0.6 MG tablet Take 1 tablet (0.6 mg total) by mouth daily. Start taking on:  06/25/2016 Replaces:  MITIGARE 0.6 MG Caps   feeding supplement (ENSURE ENLIVE) Liqd Take 237 mLs by mouth 2 (two) times daily between meals.   gabapentin 300 MG capsule Commonly known as:  NEURONTIN Take 300 mg by mouth 2 (two) times daily. Pain   hydrALAZINE 10 MG tablet Commonly known as:  APRESOLINE Take 1 tablet (10 mg total) by mouth 3 (three) times daily. What changed:  medication strength  how much to take  when to take this   HYDROcodone-acetaminophen 5-325 MG tablet Commonly known as:  NORCO/VICODIN Take 0.5 tablets by mouth 2 (two) times daily as needed (for pain).   isosorbide dinitrate 5 MG tablet Commonly known as:  ISORDIL Take 1 tablet (5 mg total) by mouth 2 (two) times daily.   magnesium  oxide 400 MG tablet Commonly known as:  MAG-OX Take two tablets (800 mg) by mouth twice daily   omeprazole 20 MG capsule Commonly known as:  PRILOSEC Take 20 mg by mouth at bedtime.   pantoprazole 40 MG tablet Commonly known as:  PROTONIX Take 1 tablet (40 mg total) by mouth daily at 12 noon.   tobramycin-dexamethasone ophthalmic solution Commonly known as:  TOBRADEX Place 1 drop into the right eye 2 (two) times daily.   torsemide 10 MG tablet Commonly known as:  DEMADEX Take 2 tablets (20 mg total) by mouth daily. Or as directed by your physician What changed:  how much to take      Follow-up Information    Gennette Pac, MD. Schedule an appointment as soon as possible for a visit in 1 week(s).   Specialty:  Family Medicine Contact information: [place] [address] [phone]        Virl Axe, MD Follow up.   Specialty:  Cardiology Contact information: [address] [place] [address] [phone]          Allergies  Allergen Reactions  . Bidil [Isosorb Dinitrate-Hydralazine] Other (See Comments)    Headaches   . Spironolactone Other (See Comments)  Hyperkalemia   . Hydralazine Hcl Other (See Comments)    "Fuzzy headed" feeling    Consultations:   cardiology   Other Procedures/Studies: US Renal  Result Date: 06/10/2016 CLINICAL DATA:  Acute kidney injury EXAM: RENAL / URINARY TRACT ULTRASOUND COMPLETE COMPARISON:  CT 05/26/2015 FINDINGS: Right Kidney: Length: 10.8 cm. Echogenicity within normal limits. No mass or hydronephrosis visualized. Left Kidney: Length: 11.4 cm. Echogenicity within normal limits. No hydronephrosis. 1.7 x 1.6 x 1.2 cm hypoechoic probable cyst in the mid left kidney Bladder: Appears normal for degree of bladder distention. Prostate slightly enlarged and measures 5 x 2.8 x 4.5 cm IMPRESSION: 1. Negative for hydronephrosis 2. 1.7 cm cyst in the left kidney Electronically  Signed   By: Donavan Foil M.D.   On: 06/10/2016 22:28   Dg Chest Port 1 View  Result Date: 06/20/2016 CLINICAL DATA:  Sudden SOB with right rib pain tonight, r/o pneumothorax. EXAM: PORTABLE CHEST 1 VIEW COMPARISON:  Chest x-rays dated 06/18/2016 and 06/08/2016. FINDINGS: Cardiomegaly appears stable. Again noted is central pulmonary vascular congestion without overt alveolar pulmonary edema. No new lung findings. No large pleural effusion. No pneumothorax seen. Left chest wall pacemaker in place. Right IJ central line appears stable in position with tip at the level of the mid SVC. Old healed fractures within the right ribs. No acute fracture or dislocation seen. IMPRESSION: 1. No acute findings.  No pneumothorax seen. 2. Stable cardiomegaly with central pulmonary vascular congestion suggesting mild CHF/volume overload. No evidence of overt alveolar pulmonary edema. No evidence of pneumonia or large pleural effusion. Electronically Signed   By: Franki Cabot M.D.   On: 06/20/2016 18:41   Dg Chest Port 1 View  Result Date: 06/18/2016 CLINICAL DATA:  Extubated. EXAM: PORTABLE CHEST 1 VIEW COMPARISON:  06/17/2016 FINDINGS: Left subclavian AICD device and leads are stable. Endotracheal tube removed. Feeding tube stable. Right jugular central venous catheter stable. Low volumes. Hazy airspace disease bilaterally and low lung volumes have worsened. Right-sided rib deformities are noted and unchanged. No pneumothorax. IMPRESSION: Increasing bilateral central and basilar opacity likely due to stable airspace disease but worsening volume loss. Electronically Signed   By: Marybelle Killings M.D.   On: 06/18/2016 13:48   Dg Chest Port 1 View  Result Date: 06/17/2016 CLINICAL DATA:  56 year old male with a history of ventilation EXAM: PORTABLE CHEST 1 VIEW COMPARISON:  06/16/2016, 06/15/2016 FINDINGS: Cardiomediastinal silhouette unchanged in size and contour with cardiomegaly. Cardiac AICD on the left chest wall  unchanged. Unchanged position of right IJ central venous catheter, endotracheal tube, and enteric feeding tube terminating out of the field of view. Low lung volumes with minimal interstitial opacities. No pneumothorax. No large pleural effusion. IMPRESSION: Re- demonstration of cardiomegaly and minimal interstitial opacities. Unchanged endotracheal tube, enteric feeding tube, and right IJ central catheter. Electronically Signed   By: Corrie Mckusick D.O.   On: 06/17/2016 14:11   Dg Chest Port 1 View  Result Date: 06/16/2016 CLINICAL DATA:  Intubation, on ventilator EXAM: PORTABLE CHEST 1 VIEW COMPARISON:  Portable chest x-ray of 06/15/2016 FINDINGS: The tip of the endotracheal tube is approximately 5.1 cm above the carina. Cardiomegaly is stable and there still mild pulmonary vascular congestion present. No pleural effusion is seen. AICD leads remain. NG tube extends below the hemidiaphragm. Right central venous line tip overlies the lower SVC. IMPRESSION: 1. Tip of endotracheal tube approximately 5.1 cm above the carina. 2. Cardiomegaly and mild pulmonary vascular congestion remain. Electronically Signed   By:  Ivar Drape M.D.   On: 06/16/2016 11:14   Dg Chest Port 1 View  Result Date: 06/15/2016 CLINICAL DATA:  56 year old male status post endotracheal tube placement on ventilator. EXAM: PORTABLE CHEST 1 VIEW COMPARISON:  Chest x-ray 06/13/2016. FINDINGS: An endotracheal tube is in place with tip 4.1 cm above the carina. There is a right-sided internal jugular central venous catheter with tip terminating in the superior cavoatrial junction. Left-sided pacemaker/AICD with lead tips projecting over the expected location of the right atrium and right ventricular apex. Low lung volumes. Improved aeration throughout the lungs bilaterally with resolution of much of the previously noted multifocal interstitial and airspace disease, presumably reflective of resolving edema and/or multifocal pneumonia. Trace right  pleural effusion. No left pleural effusion. Cephalization of the pulmonary vasculature, crowding of the pulmonary vasculature, likely accentuated by the low lung volumes. Heart size remains mildly enlarged. Upper mediastinal contours are within normal limits. Multiple surgical clips project over the thoracic inlet and the medial upper right hemithorax. Multiple old healed right-sided rib fractures. IMPRESSION: 1. Support apparatus, as above. 2. Overall, there is significant improvement compared to the examination from 2 days ago with near complete resolution of edema and/or multilobar pneumonia seen on the prior study. 3. Mild cardiomegaly. 4. Trace right pleural effusion. Electronically Signed   By: Vinnie Langton M.D.   On: 06/15/2016 13:41   Dg Chest Port 1 View  Result Date: 06/13/2016 CLINICAL DATA:  Worse part or if failure.  Shortness of breath. EXAM: PORTABLE CHEST 1 VIEW COMPARISON:  One-view chest x-ray 06/10/2016 FINDINGS: The heart is enlarged. Patient remains intubated. The endotracheal tube is stable. NG tube courses off the inferior border the film. A right IJ line is in place. Interstitial edema and mild patchy airspace disease is stable. IMPRESSION: 1. Mild edema and patchy airspace disease is stable. 2. Support apparatus is stable. Electronically Signed   By: San Morelle M.D.   On: 06/13/2016 07:14   Dg Chest Port 1 View  Result Date: 06/10/2016 CLINICAL DATA:  Central line placement. EXAM: PORTABLE CHEST 1 VIEW COMPARISON:  Earlier film, same date. FINDINGS: Right IJ center venous catheter tipa is in the distal SVC. No complicating features. The other support apparatus is stable. Mild edema and atelectasis and probable small left effusion. IMPRESSION: Right IJ center venous catheter tip is in the distal SVC. No complicating features. Electronically Signed   By: Marijo Sanes M.D.   On: 06/10/2016 19:08   Dg Chest Port 1 View  Result Date: 06/10/2016 CLINICAL DATA:  Central line  placement. EXAM: PORTABLE CHEST 1 VIEW COMPARISON:  Chest x-ray 06/09/2016 FINDINGS: The endotracheal tube is 3.6 cm above the carina. The NG tube is coursing down the esophagus and into the stomach. The pacer wires are stable. No central line is identified. The heart is enlarged but stable. Improved lung aeration with resolving edema and atelectasis. Probable left pleural effusion. IMPRESSION: No central line is identified. Stable endotracheal tube, NG tube and pacer wires. Improved lung aeration. Electronically Signed   By: Marijo Sanes M.D.   On: 06/10/2016 17:46   Dg Chest Port 1 View  Result Date: 06/09/2016 CLINICAL DATA:  Respiratory failure requiring intubation EXAM: PORTABLE CHEST 1 VIEW COMPARISON:  Chest x-ray of 06/09/2016 FINDINGS: The tip of the endotracheal tube is approximately 2.0 cm above the carina. AICD and pacer leads remain. Cardiomegaly is stable. OG tube extends into the stomach. There does appear to be pulmonary vascular congestion present. No pleural  effusion is seen. IMPRESSION: 1. Tip of endotracheal tube 2.0 cm above the carina. 2. Cardiomegaly and pulmonary vascular congestion. 3. OG tube extends into the stomach, with the tip not visualized. Electronically Signed   By: Ivar Drape M.D.   On: 06/09/2016 11:59   Dg Chest Port 1 View  Result Date: 06/09/2016 CLINICAL DATA:  Daily check for Vent and CVL patient. EXAM: PORTABLE CHEST 1 VIEW COMPARISON:  06/08/2016 FINDINGS: Patient's endotracheal tube, tip 2.4 cm above the carina. A nasogastric tube is in place with tip beyond the gastroesophageal junction on the film. I such as his pacemaker leads overlie the right ventricle and right atrium. Heart is enlarged. There is mild bibasilar airspace filling. There has been some improvement in aeration of the left lung base with the left hemidiaphragm more apparent. Multiple old rib fractures. IMPRESSION: Slight improvement in aeration of left lung base. Persistent bibasilar airspace  filling. Electronically Signed   By: Nolon Nations M.D.   On: 06/09/2016 08:55   Dg Chest Port 1 View  Result Date: 06/08/2016 CLINICAL DATA:  Respiratory failure, intubated patient. EXAM: PORTABLE CHEST 1 VIEW COMPARISON:  Portable chest x-ray of June 07, 2016. FINDINGS: The lungs are adequately inflated. There is minimal bibasilar density greatest on the left which has improved since yesterday's study. The cardiac silhouette remains enlarged. The pulmonary vascularity is less prominent. The ICD is in stable position. The endotracheal tube tip lies 5 cm above the carina. The esophagogastric tube tip projects below the inferior margin of the image. Old fractures of posterior right fifth and sixth and seventh ribs are demonstrated. IMPRESSION: Slight improvement in bibasilar atelectasis or pneumonia. The support tubes are in reasonable position. Electronically Signed   By: David  Martinique M.D.   On: 06/08/2016 07:01   Dg Chest Port 1 View  Result Date: 06/07/2016 CLINICAL DATA:  Acute onset shortness of breath 06/06/2016. Patient intubated. EXAM: PORTABLE CHEST 1 VIEW COMPARISON:  Single-view of the chest 06/06/2016 and CT chest 04/08/2015. FINDINGS: Support tubes and lines are unchanged and project in good position. There is cardiomegaly and vascular congestion. Bibasilar airspace disease is worse on the left. Multiple remote right rib fractures are again seen. IMPRESSION: ETT and NG tube project in good position. Left worse than right basilar airspace disease could be due to atelectasis or pneumonia. No change in cardiomegaly and vascular congestion. Electronically Signed   By: Inge Rise M.D.   On: 06/07/2016 08:59   Dg Chest Portable 1 View  Result Date: 06/06/2016 CLINICAL DATA:  Respiratory failure, post intubation EXAM: PORTABLE CHEST 1 VIEW COMPARISON:  06/06/2016 CXR at 1641 hours FINDINGS: New endotracheal tube tip is noted 3 cm above the carina. The cardiac silhouette is stable and mildly  enlarged in appearance as before. Aorta is not aneurysmal. There is mild central vascular congestion. ICD device projects over the left shoulder with single pacing lead in the right atrium and pacing and defibrillator leads in the right ventricle. Chronic right fifth and seventh rib fractures with callus formation. No pulmonary consolidation or effusion. No pneumothorax. Gastric tube extends into the body of the stomach. IMPRESSION: 1. Satisfactory support line and tube positions. 2. Low lung volumes with mild vascular congestion and stable cardiomegaly. 3. ICD device in place. Electronically Signed   By: Ashley Royalty M.D.   On: 06/06/2016 17:51   Dg Chest Port 1 View  Result Date: 06/06/2016 CLINICAL DATA:  Shortness of breath. History of congestive heart failure, hypertension and sleep  apnea. EXAM: PORTABLE CHEST 1 VIEW COMPARISON:  Radiographs 04/08/2016.  CT 04/08/2015. FINDINGS: 1641 hours. Left subclavian AICD leads appear unchanged. There is stable cardiomegaly and chronic right basilar scarring. Old rib fractures are present on the right. No evidence of edema, confluent airspace opacity, pleural effusion or pneumothorax. IMPRESSION: Stable post operative chest.  No acute cardiopulmonary process. Electronically Signed   By: Richardean Sale M.D.   On: 06/06/2016 16:56   Dg Abd Portable 1v  Result Date: 06/15/2016 CLINICAL DATA:  Feeding tube placement. EXAM: PORTABLE ABDOMEN - 1 VIEW COMPARISON:  Supine abdominal radiograph of June 09, 2016 FINDINGS: The radiodense feeding tube has replaced the esophagogastric tube. The tip of the feeding tube lies in the region of the third portion of the duodenum. IMPRESSION: The feeding tube tip now lies in the region of the third portion of the duodenum. Electronically Signed   By: David  Martinique M.D.   On: 06/15/2016 17:15   Dg Abd Portable 1v  Result Date: 06/09/2016 CLINICAL DATA:  Respiratory failure requiring intubation EXAM: PORTABLE ABDOMEN - 1 VIEW  COMPARISON:  CT abdomen pelvis of 05/26/2015 FINDINGS: A portable supine film of the abdomen shows some air in the colon but no bowel obstruction is seen. An OG tube is present with the tip in the region of duodenal bulb or descending duodenum. No opaque calculi are seen. IMPRESSION: 1. OG tube tip in the region of the duodenal bulb or descending duodenum. 2. Some colonic gas but no bowel obstruction is seen. Electronically Signed   By: Ivar Drape M.D.   On: 06/09/2016 12:00     TODAY-DAY OF DISCHARGE:  Subjective:   [name] today has no headache,no chest abdominal pain,no new weakness tingling or numbness, feels much better wants to go home today.  Objective:   Blood pressure 109/70, pulse 88, temperature 97.8 F (36.6 C), temperature source Oral, resp. rate 20, height 5\' 6"  (1.676 m), weight 97.7 kg (215 lb 6.4 oz), SpO2 100 %.  Intake/Output Summary (Last 24 hours) at 06/24/16 1130 Last data filed at 06/24/16 0900  Gross per 24 hour  Intake              723 ml  Output              800 ml  Net              -77 ml   Filed Weights   06/22/16 0600 06/23/16 0659 06/24/16 0542  Weight: 98 kg (216 lb 0.8 oz) 98.6 kg (217 lb 6.4 oz) 97.7 kg (215 lb 6.4 oz)    Exam: Awake Alert, Oriented *3, No new F.N deficits, Normal affect .AT,PERRAL Supple Neck,No JVD, No cervical lymphadenopathy appriciated.  Symmetrical Chest wall movement, Good air movement bilaterally, CTAB RRR,No Gallops,Rubs or new Murmurs, No Parasternal Heave +ve B.Sounds, Abd Soft, Non tender, No organomegaly appriciated, No rebound -guarding or rigidity. No Cyanosis, Clubbing,+ edema, No new Rash or bruise   PERTINENT RADIOLOGIC STUDIES: US Renal  Result Date: 06/10/2016 CLINICAL DATA:  Acute kidney injury EXAM: RENAL / URINARY TRACT ULTRASOUND COMPLETE COMPARISON:  CT 05/26/2015 FINDINGS: Right Kidney: Length: 10.8 cm. Echogenicity within normal limits. No mass or hydronephrosis visualized. Left Kidney:  Length: 11.4 cm. Echogenicity within normal limits. No hydronephrosis. 1.7 x 1.6 x 1.2 cm hypoechoic probable cyst in the mid left kidney Bladder: Appears normal for degree of bladder distention. Prostate slightly enlarged and measures 5 x 2.8 x 4.5 cm IMPRESSION: 1. Negative  for hydronephrosis 2. 1.7 cm cyst in the left kidney Electronically Signed   By: Donavan Foil M.D.   On: 06/10/2016 22:28   Dg Chest Port 1 View  Result Date: 06/20/2016 CLINICAL DATA:  Sudden SOB with right rib pain tonight, r/o pneumothorax. EXAM: PORTABLE CHEST 1 VIEW COMPARISON:  Chest x-rays dated 06/18/2016 and 06/08/2016. FINDINGS: Cardiomegaly appears stable. Again noted is central pulmonary vascular congestion without overt alveolar pulmonary edema. No new lung findings. No large pleural effusion. No pneumothorax seen. Left chest wall pacemaker in place. Right IJ central line appears stable in position with tip at the level of the mid SVC. Old healed fractures within the right ribs. No acute fracture or dislocation seen. IMPRESSION: 1. No acute findings.  No pneumothorax seen. 2. Stable cardiomegaly with central pulmonary vascular congestion suggesting mild CHF/volume overload. No evidence of overt alveolar pulmonary edema. No evidence of pneumonia or large pleural effusion. Electronically Signed   By: Franki Cabot M.D.   On: 06/20/2016 18:41   Dg Chest Port 1 View  Result Date: 06/18/2016 CLINICAL DATA:  Extubated. EXAM: PORTABLE CHEST 1 VIEW COMPARISON:  06/17/2016 FINDINGS: Left subclavian AICD device and leads are stable. Endotracheal tube removed. Feeding tube stable. Right jugular central venous catheter stable. Low volumes. Hazy airspace disease bilaterally and low lung volumes have worsened. Right-sided rib deformities are noted and unchanged. No pneumothorax. IMPRESSION: Increasing bilateral central and basilar opacity likely due to stable airspace disease but worsening volume loss. Electronically Signed   By: Marybelle Killings M.D.   On: 06/18/2016 13:48   Dg Chest Port 1 View  Result Date: 06/17/2016 CLINICAL DATA:  56 year old male with a history of ventilation EXAM: PORTABLE CHEST 1 VIEW COMPARISON:  06/16/2016, 06/15/2016 FINDINGS: Cardiomediastinal silhouette unchanged in size and contour with cardiomegaly. Cardiac AICD on the left chest wall unchanged. Unchanged position of right IJ central venous catheter, endotracheal tube, and enteric feeding tube terminating out of the field of view. Low lung volumes with minimal interstitial opacities. No pneumothorax. No large pleural effusion. IMPRESSION: Re- demonstration of cardiomegaly and minimal interstitial opacities. Unchanged endotracheal tube, enteric feeding tube, and right IJ central catheter. Electronically Signed   By: Corrie Mckusick D.O.   On: 06/17/2016 14:11   Dg Chest Port 1 View  Result Date: 06/16/2016 CLINICAL DATA:  Intubation, on ventilator EXAM: PORTABLE CHEST 1 VIEW COMPARISON:  Portable chest x-ray of 06/15/2016 FINDINGS: The tip of the endotracheal tube is approximately 5.1 cm above the carina. Cardiomegaly is stable and there still mild pulmonary vascular congestion present. No pleural effusion is seen. AICD leads remain. NG tube extends below the hemidiaphragm. Right central venous line tip overlies the lower SVC. IMPRESSION: 1. Tip of endotracheal tube approximately 5.1 cm above the carina. 2. Cardiomegaly and mild pulmonary vascular congestion remain. Electronically Signed   By: Ivar Drape M.D.   On: 06/16/2016 11:14   Dg Chest Port 1 View  Result Date: 06/15/2016 CLINICAL DATA:  56 year old male status post endotracheal tube placement on ventilator. EXAM: PORTABLE CHEST 1 VIEW COMPARISON:  Chest x-ray 06/13/2016. FINDINGS: An endotracheal tube is in place with tip 4.1 cm above the carina. There is a right-sided internal jugular central venous catheter with tip terminating in the superior cavoatrial junction. Left-sided pacemaker/AICD with lead  tips projecting over the expected location of the right atrium and right ventricular apex. Low lung volumes. Improved aeration throughout the lungs bilaterally with resolution of much of the previously noted multifocal interstitial and  airspace disease, presumably reflective of resolving edema and/or multifocal pneumonia. Trace right pleural effusion. No left pleural effusion. Cephalization of the pulmonary vasculature, crowding of the pulmonary vasculature, likely accentuated by the low lung volumes. Heart size remains mildly enlarged. Upper mediastinal contours are within normal limits. Multiple surgical clips project over the thoracic inlet and the medial upper right hemithorax. Multiple old healed right-sided rib fractures. IMPRESSION: 1. Support apparatus, as above. 2. Overall, there is significant improvement compared to the examination from 2 days ago with near complete resolution of edema and/or multilobar pneumonia seen on the prior study. 3. Mild cardiomegaly. 4. Trace right pleural effusion. Electronically Signed   By: Vinnie Langton M.D.   On: 06/15/2016 13:41   Dg Chest Port 1 View  Result Date: 06/13/2016 CLINICAL DATA:  Worse part or if failure.  Shortness of breath. EXAM: PORTABLE CHEST 1 VIEW COMPARISON:  One-view chest x-ray 06/10/2016 FINDINGS: The heart is enlarged. Patient remains intubated. The endotracheal tube is stable. NG tube courses off the inferior border the film. A right IJ line is in place. Interstitial edema and mild patchy airspace disease is stable. IMPRESSION: 1. Mild edema and patchy airspace disease is stable. 2. Support apparatus is stable. Electronically Signed   By: San Morelle M.D.   On: 06/13/2016 07:14   Dg Chest Port 1 View  Result Date: 06/10/2016 CLINICAL DATA:  Central line placement. EXAM: PORTABLE CHEST 1 VIEW COMPARISON:  Earlier film, same date. FINDINGS: Right IJ center venous catheter tipa is in the distal SVC. No complicating features. The other  support apparatus is stable. Mild edema and atelectasis and probable small left effusion. IMPRESSION: Right IJ center venous catheter tip is in the distal SVC. No complicating features. Electronically Signed   By: Marijo Sanes M.D.   On: 06/10/2016 19:08   Dg Chest Port 1 View  Result Date: 06/10/2016 CLINICAL DATA:  Central line placement. EXAM: PORTABLE CHEST 1 VIEW COMPARISON:  Chest x-ray 06/09/2016 FINDINGS: The endotracheal tube is 3.6 cm above the carina. The NG tube is coursing down the esophagus and into the stomach. The pacer wires are stable. No central line is identified. The heart is enlarged but stable. Improved lung aeration with resolving edema and atelectasis. Probable left pleural effusion. IMPRESSION: No central line is identified. Stable endotracheal tube, NG tube and pacer wires. Improved lung aeration. Electronically Signed   By: Marijo Sanes M.D.   On: 06/10/2016 17:46   Dg Chest Port 1 View  Result Date: 06/09/2016 CLINICAL DATA:  Respiratory failure requiring intubation EXAM: PORTABLE CHEST 1 VIEW COMPARISON:  Chest x-ray of 06/09/2016 FINDINGS: The tip of the endotracheal tube is approximately 2.0 cm above the carina. AICD and pacer leads remain. Cardiomegaly is stable. OG tube extends into the stomach. There does appear to be pulmonary vascular congestion present. No pleural effusion is seen. IMPRESSION: 1. Tip of endotracheal tube 2.0 cm above the carina. 2. Cardiomegaly and pulmonary vascular congestion. 3. OG tube extends into the stomach, with the tip not visualized. Electronically Signed   By: Ivar Drape M.D.   On: 06/09/2016 11:59   Dg Chest Port 1 View  Result Date: 06/09/2016 CLINICAL DATA:  Daily check for Vent and CVL patient. EXAM: PORTABLE CHEST 1 VIEW COMPARISON:  06/08/2016 FINDINGS: Patient's endotracheal tube, tip 2.4 cm above the carina. A nasogastric tube is in place with tip beyond the gastroesophageal junction on the film. I such as his pacemaker leads  overlie the right ventricle and  right atrium. Heart is enlarged. There is mild bibasilar airspace filling. There has been some improvement in aeration of the left lung base with the left hemidiaphragm more apparent. Multiple old rib fractures. IMPRESSION: Slight improvement in aeration of left lung base. Persistent bibasilar airspace filling. Electronically Signed   By: Nolon Nations M.D.   On: 06/09/2016 08:55   Dg Chest Port 1 View  Result Date: 06/08/2016 CLINICAL DATA:  Respiratory failure, intubated patient. EXAM: PORTABLE CHEST 1 VIEW COMPARISON:  Portable chest x-ray of June 07, 2016. FINDINGS: The lungs are adequately inflated. There is minimal bibasilar density greatest on the left which has improved since yesterday's study. The cardiac silhouette remains enlarged. The pulmonary vascularity is less prominent. The ICD is in stable position. The endotracheal tube tip lies 5 cm above the carina. The esophagogastric tube tip projects below the inferior margin of the image. Old fractures of posterior right fifth and sixth and seventh ribs are demonstrated. IMPRESSION: Slight improvement in bibasilar atelectasis or pneumonia. The support tubes are in reasonable position. Electronically Signed   By: David  Martinique M.D.   On: 06/08/2016 07:01   Dg Chest Port 1 View  Result Date: 06/07/2016 CLINICAL DATA:  Acute onset shortness of breath 06/06/2016. Patient intubated. EXAM: PORTABLE CHEST 1 VIEW COMPARISON:  Single-view of the chest 06/06/2016 and CT chest 04/08/2015. FINDINGS: Support tubes and lines are unchanged and project in good position. There is cardiomegaly and vascular congestion. Bibasilar airspace disease is worse on the left. Multiple remote right rib fractures are again seen. IMPRESSION: ETT and NG tube project in good position. Left worse than right basilar airspace disease could be due to atelectasis or pneumonia. No change in cardiomegaly and vascular congestion. Electronically Signed   By:  Inge Rise M.D.   On: 06/07/2016 08:59   Dg Chest Portable 1 View  Result Date: 06/06/2016 CLINICAL DATA:  Respiratory failure, post intubation EXAM: PORTABLE CHEST 1 VIEW COMPARISON:  06/06/2016 CXR at 1641 hours FINDINGS: New endotracheal tube tip is noted 3 cm above the carina. The cardiac silhouette is stable and mildly enlarged in appearance as before. Aorta is not aneurysmal. There is mild central vascular congestion. ICD device projects over the left shoulder with single pacing lead in the right atrium and pacing and defibrillator leads in the right ventricle. Chronic right fifth and seventh rib fractures with callus formation. No pulmonary consolidation or effusion. No pneumothorax. Gastric tube extends into the body of the stomach. IMPRESSION: 1. Satisfactory support line and tube positions. 2. Low lung volumes with mild vascular congestion and stable cardiomegaly. 3. ICD device in place. Electronically Signed   By: Ashley Royalty M.D.   On: 06/06/2016 17:51   Dg Chest Port 1 View  Result Date: 06/06/2016 CLINICAL DATA:  Shortness of breath. History of congestive heart failure, hypertension and sleep apnea. EXAM: PORTABLE CHEST 1 VIEW COMPARISON:  Radiographs 04/08/2016.  CT 04/08/2015. FINDINGS: 1641 hours. Left subclavian AICD leads appear unchanged. There is stable cardiomegaly and chronic right basilar scarring. Old rib fractures are present on the right. No evidence of edema, confluent airspace opacity, pleural effusion or pneumothorax. IMPRESSION: Stable post operative chest.  No acute cardiopulmonary process. Electronically Signed   By: Richardean Sale M.D.   On: 06/06/2016 16:56   Dg Abd Portable 1v  Result Date: 06/15/2016 CLINICAL DATA:  Feeding tube placement. EXAM: PORTABLE ABDOMEN - 1 VIEW COMPARISON:  Supine abdominal radiograph of June 09, 2016 FINDINGS: The radiodense feeding tube has replaced  the esophagogastric tube. The tip of the feeding tube lies in the region of the third  portion of the duodenum. IMPRESSION: The feeding tube tip now lies in the region of the third portion of the duodenum. Electronically Signed   By: David  Martinique M.D.   On: 06/15/2016 17:15   Dg Abd Portable 1v  Result Date: 06/09/2016 CLINICAL DATA:  Respiratory failure requiring intubation EXAM: PORTABLE ABDOMEN - 1 VIEW COMPARISON:  CT abdomen pelvis of 05/26/2015 FINDINGS: A portable supine film of the abdomen shows some air in the colon but no bowel obstruction is seen. An OG tube is present with the tip in the region of duodenal bulb or descending duodenum. No opaque calculi are seen. IMPRESSION: 1. OG tube tip in the region of the duodenal bulb or descending duodenum. 2. Some colonic gas but no bowel obstruction is seen. Electronically Signed   By: Ivar Drape M.D.   On: 06/09/2016 12:00     PERTINENT LAB RESULTS: CBC:  Recent Labs  06/22/16 0320  WBC 10.9*  HGB 12.4*  HCT 37.3*  PLT 324   CMET CMP     Component Value Date/Time   NA 132 (L) 06/24/2016 0333   K 5.3 (H) 06/24/2016 0333   CL 99 (L) 06/24/2016 0333   CO2 23 06/24/2016 0333   GLUCOSE 109 (H) 06/24/2016 0333   BUN 61 (H) 06/24/2016 0333   CREATININE 1.81 (H) 06/24/2016 0333   CREATININE 1.71 (H) 03/10/2015 0859   CALCIUM 9.5 06/24/2016 0333   PROT 6.1 (L) 06/06/2016 1925   ALBUMIN 2.8 (L) 06/22/2016 0320   AST 64 (H) 06/06/2016 1925   ALT 46 06/06/2016 1925   ALKPHOS 90 06/06/2016 1925   BILITOT 1.8 (H) 06/06/2016 1925   GFRNONAA 40 (L) 06/24/2016 0333   GFRAA 46 (L) 06/24/2016 0333    GFR Estimated Creatinine Clearance: 49.3 mL/min (A) (by C-G formula based on SCr of 1.81 mg/dL (H)). No results for input(s): LIPASE, AMYLASE in the last 72 hours. No results for input(s): CKTOTAL, CKMB, CKMBINDEX, TROPONINI in the last 72 hours. Invalid input(s): POCBNP No results for input(s): DDIMER in the last 72 hours. No results for input(s): HGBA1C in the last 72 hours. No results for input(s): CHOL, HDL, LDLCALC,  TRIG, CHOLHDL, LDLDIRECT in the last 72 hours. No results for input(s): TSH, T4TOTAL, T3FREE, THYROIDAB in the last 72 hours.  Invalid input(s): FREET3 No results for input(s): VITAMINB12, FOLATE, FERRITIN, TIBC, IRON, RETICCTPCT in the last 72 hours. Coags: No results for input(s): INR in the last 72 hours.  Invalid input(s): PT Microbiology: No results found for this or any previous visit (from the past 240 hour(s)).  FURTHER DISCHARGE INSTRUCTIONS:  Get Medicines reviewed and adjusted: Please take all your medications with you for your next visit with your Primary MD  Laboratory/radiological data: Please request your Primary MD to go over all hospital tests and procedure/radiological results at the follow up, please ask your Primary MD to get all Hospital records sent to his/her office.  In some cases, they will be blood work, cultures and biopsy results pending at the time of your discharge. Please request that your primary care M.D. goes through all the records of your hospital data and follows up on these results.  Also Note the following: If you experience worsening of your admission symptoms, develop shortness of breath, life threatening emergency, suicidal or homicidal thoughts you must seek medical attention immediately by calling 911 or calling your MD immediately  if symptoms less severe.  You must read complete instructions/literature along with all the possible adverse reactions/side effects for all the Medicines you take and that have been prescribed to you. Take any new Medicines after you have completely understood and accpet all the possible adverse reactions/side effects.   Do not drive when taking Pain medications or sleeping medications (Benzodaizepines)  Do not take more than prescribed Pain, Sleep and Anxiety Medications. It is not advisable to combine anxiety,sleep and pain medications without talking with your primary care practitioner  Special Instructions: If  you have smoked or chewed Tobacco  in the last 2 yrs please stop smoking, stop any regular Alcohol  and or any Recreational drug use.  Wear Seat belts while driving.  Please note: You were cared for by a hospitalist during your hospital stay. Once you are discharged, your primary care physician will handle any further medical issues. Please note that NO REFILLS for any discharge medications will be authorized once you are discharged, as it is imperative that you return to your primary care physician (or establish a relationship with a primary care physician if you do not have one) for your post hospital discharge needs so that they can reassess your need for medications and monitor your lab values.  Total Time spent coordinating discharge including counseling, education and face to face time equals 45 minutes.  Signed: Merville Hijazi 06/24/2016 11:30 AM

## 2016-06-24 NOTE — Progress Notes (Signed)
Advanced Heart Failure Rounding Note   Primary Cardiologist: Dr. Haroldine Laws  Subjective:    Admitted 06/06/16 with acute respiratory failure requiring intubation secondary to volume overload and septic shock. 1/2 blood cultures positive for strep viridans.   Extubated 06/18/16.   Remains fatigued, working with PT. Remains dyspneic with exertion. Denies chest pain and orthopnea. Weight stable, down 2 pounds.   Studies TEE 06/15/16 with LVEF 15%, No thrombus noted, RV mildly decreased in function, mild/mod LAE, moderate to severe MR with some restriction of posterior leaflet.  No endocarditis noted.   Objective:   Weight Range: 215 lb 6.4 oz (97.7 kg) Body mass index is 34.77 kg/m.   Vital Signs:   Temp:  [97.5 F (36.4 C)-98.8 F (37.1 C)] 97.8 F (36.6 C) (04/20 0542) Pulse Rate:  [83-90] 87 (04/20 0542) Resp:  [20] 20 (04/20 0542) BP: (102-125)/(43-89) 122/89 (04/20 0542) SpO2:  [100 %] 100 % (04/20 0542) Weight:  [215 lb 6.4 oz (97.7 kg)] 215 lb 6.4 oz (97.7 kg) (04/20 0542) Last BM Date: 06/23/16  Weight change: Filed Weights   06/22/16 0600 06/23/16 0659 06/24/16 0542  Weight: 216 lb 0.8 oz (98 kg) 217 lb 6.4 oz (98.6 kg) 215 lb 6.4 oz (97.7 kg)    Intake/Output:   Intake/Output Summary (Last 24 hours) at 06/24/16 0742 Last data filed at 06/24/16 0600  Gross per 24 hour  Intake             1083 ml  Output             1050 ml  Net               33 ml     Physical Exam: General: Weak appearing male, NAD. Sitting in chair at bedside.  HEENT: normal Neck: supple. No JVP. Carotids 2+ bilat; no bruits. No thyromegaly or nodule noted. Cor: PMI nondisplaced. RRR, No murmurs, rubs or gallops.  Lungs: Diminished throughout.  Abdomen: soft, non-tender, distended, no HSM. No bruits or masses. +BS  Extremities: no cyanosis, clubbing, rash, R and LLE no edema.  Neuro: alert & orientedx3, cranial nerves grossly intact. moves all 4 extremities w/o difficulty.  Affect pleasant   Telemetry: NSR A sensed V paced 80s. Personally reviewed   Labs: CBC  Recent Labs  06/22/16 0320  WBC 10.9*  NEUTROABS 8.0*  HGB 12.4*  HCT 37.3*  MCV 88.2  PLT 160   Basic Metabolic Panel  Recent Labs  06/22/16 0320 06/24/16 0333  NA 131* 132*  K 4.6 5.3*  CL 96* 99*  CO2 25 23  GLUCOSE 94 109*  BUN 91* 61*  CREATININE 2.03* 1.81*  CALCIUM 9.3 9.5  PHOS 4.4  --    Liver Function Tests  Recent Labs  06/22/16 0320  ALBUMIN 2.8*    BNP: BNP (last 3 results)  Recent Labs  06/06/16 1645 06/06/16 1925  BNP 796.0* 553.0*   Transthoracic Echocardiography 06/09/16 Study Conclusions  - Left ventricle: The cavity size was severely dilated. Wall   thickness was normal. The estimated ejection fraction was 20%.   Diffuse hypokinesis. - Aortic valve: There was mild regurgitation. - Mitral valve: There was moderate regurgitation. - Left atrium: The atrium was moderately dilated. - Atrial septum: No defect or patent foramen ovale was identified.     Medications:     Scheduled Medications: . atorvastatin  40 mg Oral QHS  . carvedilol  3.125 mg Oral BID WC  . Chlorhexidine Gluconate Cloth  6 each Topical Daily  . colchicine  0.6 mg Oral Daily  . feeding supplement (ENSURE ENLIVE)  237 mL Oral BID BM  . heparin  5,000 Units Subcutaneous Q8H  . hydrALAZINE  10 mg Oral Q8H  . isosorbide dinitrate  5 mg Oral BID  . mouth rinse  15 mL Mouth Rinse BID  . pantoprazole  40 mg Oral Q1200  . sodium chloride flush  10-40 mL Intracatheter Q12H  . tobramycin-dexamethasone   Right Eye BID    Infusions:   PRN Medications: ALPRAZolam, guaiFENesin, HYDROcodone-acetaminophen, sodium chloride, sodium chloride flush   Assessment/Plan   1. Septic shock: CAP  - Strep viridans 1/2 blood cultures, follow up cultures negative.  - Per ID continue IV antibiotics 14 days from 06/09/16. Completed course.  - TEE without endocarditis or infected lead.    - No change  2. Acute hypoxic respiratory failure:  Oxygen weaned to 2 liters.  - - Asked about diaphragm plication. Per Hana Trippett, too ill to consider this for now.  - Working with incentive spirometer.  - no change.  3. Acute on chronic renal failure stage III - Baseline creatinine 1.7-1.8   - Creatinine improving, now 1.81 4. Chronic systolic CHF: EF 16% on 1/0/96 - Volume status stable.  - Continue Coreg 6.25mg  BID - Continue hydralazine 10mg  TID.  - Continue isordil 5mg  BID - Was on torsemide 10mg  daily at home. Consider adding back before he goes to CIR.  - No ACE/ARB/ARNI with CKD.  5. Paralyzed right hemidiaphragm - Stable.  - No change. 6. History of paraganglioma resection  Plan for CIR.   Length of Stay: [place], NP  06/24/2016, 7:42 AM Advanced Heart Failure Team   Pager [phone] M-F 7am-4pm.  Please contact [facility] for night-coverage after hours (4p -7a ) and weekends on [email or web address]  Patient seen and examined with Jettie Booze, NP. We discussed all aspects of the encounter. I agree with the assessment and plan as stated above.   Volume status stable. Respiratory status mildly improved. Renal function back to baseline.   Agree with plan for CIR. Discussed diuretic regimen with Dr. Nigel Bridgeman. Will restart torsemide.   We will follow intermittently in CIR> Please call us with questions.   Glori Bickers, MD  4:52 PM

## 2016-06-24 NOTE — PMR Pre-admission (Signed)
PMR Admission Coordinator Pre-Admission Assessment  Patient: [name] is an 56 y.o., male MRN: [id number] DOB: Jun 24, 1960 Height: _0  (167.6 cm) (measure per RRT) Weight: 97.7 kg (215 lb 6.4 oz)              Insurance Information HMO:     PPO: Yes     PCP:       IPA:       80/20:       OTHER:  Group # [id number] PRIMARY:  [employer]      Policy#:  [id number]      Subscriber:  [place] Name: Silvana Newness      Phone#: [phone]     Fax#:   Pre-Cert#: [id number]      Employer: [employer] work Benefits:  Phone #: [phone]     Name:  Hanley Seamen. Date: 03/07/16     Deduct:  $200 (met $200)      Out of Pocket Max:  $2250 (met [phone])      Life Max:  N/A CIR:  90%/10%      SNF: 100% with 90 days max Outpatient: 60 combined visits PT/OT/SLP     Co-Pay:  $10/visit Home Health: 100%      Co-Pay: none DME: 80%     Co-Pay: 20% Providers: in network  Medicaid Application Date:        Case Manager:   Disability Application Date:        Case Worker:    Emergency Facilities manager Information    Name Relation Home Work Mobile   Erhardt,Lourdes Spouse [phone]     Current Medical History  Patient Admitting Diagnosis:  Debility  History of Present Illness: A 56 y.o.right handed malewith history of diastolic congestive heart failure. Hemidiaphragm status post para ganglioglioma resection, CKD stage III, CAD-ICD, right corneal eye surgery. Per chart review patient lives with spouse as well as wheelchair bound teenager who is independent from wheelchair level. Reported to be independent prior to admissionand still working as well as his wife works. Two-level home with bed and bathroom on first floor. He presented 06/06/2016 with increasing shortness of breath and diaphoresis. Oxygen saturations 83%. Troponin negative. Noted creatinine of 2.40 from baseline 1.71. Hyperkalemia 6.4. Chest x-ray negative for acute process. Initially treated for CHF  exacerbation with BiPAP, IV Lasix. Patient later required intubation for respiratory distress. Blood cultures grew strep viridans in one of 2 bottles. Maintain on broad-spectrum antibiotics transitioned to Rocephin and antibiotic therapies have been completed.. Echocardiogram with ejection fraction of 20% diffuse hypokinesis no PFO. TEE moderate to severe mitral regurgitation no vegetation. Renal ultrasound negative for hydronephrosis with follow-up per renal services and maintain on gentle IV fluids with creatinine improved 1.81. Patient remained intubated until 06/19/2016. Subcutaneous heparin added for DVT prophylaxis. .Diet has been advanced to a regular consistency. Physical and occupational therapy evaluation completed and ongoing with recommendations of physical medicine rehabilitation consult. Patient to be admitted for a comprehensive inpatient rehabilitation program.   Past Medical History  Past Medical History:  Diagnosis Date  . Arthritis    GOUT  . Automatic implantable cardioverter-defibrillator in situ   . Biventricular ICD (implantable cardiac defibrillator) Medtronic ]    DOI 2008/ upgrade 2010/ Gen Change 2014  . Bladder tumor    PT HOSP AT [facility] 4/24 TO 4/26 2014 WITH UTI--AND FOUND TO HAVE BLADDER TUMOR-  . Cardiomyopathy    Idiopathic dilated;   . CHF (congestive heart failure) ([place])   .  CKD (chronic kidney disease)    stage III baseline Crt 1.8-2.0  . Coronary artery disease   . Gout 08/03/12   PT C/O OF RIGHT KNEE PAIN AND SWELLING -STATES GOUT FLARE UP - ONGOING SINCE APRIL - BUT SWELLING W/IN LAST WEEK  . Hilar mass    Noted CT 2010  . HTN (hypertension)    Dr. Caryl Comes cardiologist  . Hypothyroidism   . 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 Sleep apnea    CPAP  . Systolic CHF ([place])    EF 84%  . Ventricular tachycardia [facility])    s/p ICD    Family History  Family history is unknown by patient.  Prior  Rehab/Hospitalizations: No previous rehab  Has the patient had major surgery during 100 days prior to admission? No  Current Medications   Current Facility-Administered Medications:  .  ALPRAZolam (XANAX) tablet 0.25-0.5 mg, 0.25-0.5 mg, Oral, TID PRN, Cherene Altes, MD, 0.25 mg at 06/20/16 1959 .  atorvastatin (LIPITOR) tablet 40 mg, 40 mg, Oral, QHS, Cherene Altes, MD, 40 mg at 06/23/16 2122 .  carvedilol (COREG) tablet 3.125 mg, 3.125 mg, Oral, BID WC, Serita Grammes, MD, 3.125 mg at 06/24/16 1034 .  Chlorhexidine Gluconate Cloth 2 % PADS 6 each, 6 each, Topical, Daily, Rush Farmer, MD, 6 each at 06/21/16 [phone] .  colchicine tablet 0.6 mg, 0.6 mg, Oral, Daily, Ledell Noss, MD, 0.6 mg at 06/24/16 1032 .  feeding supplement (ENSURE ENLIVE) (ENSURE ENLIVE) liquid 237 mL, 237 mL, Oral, BID BM, Cherene Altes, MD, 237 mL at 06/23/16 1400 .  guaiFENesin (MUCINEX) 12 hr tablet 600 mg, 600 mg, Oral, BID PRN, Ledell Noss, MD .  heparin injection 5,000 Units, 5,000 Units, Subcutaneous, Q8H, Anders Simmonds, MD, 5,000 Units at 06/24/16 0547 .  hydrALAZINE (APRESOLINE) tablet 10 mg, 10 mg, Oral, Q8H, Serita Grammes, MD, 10 mg at 06/24/16 0547 .  HYDROcodone-acetaminophen (NORCO/VICODIN) 5-325 MG per tablet 1 tablet, 1 tablet, Oral, Q6H PRN, Gardiner Barefoot, NP, 1 tablet at 06/23/16 2122 .  isosorbide dinitrate (ISORDIL) tablet 5 mg, 5 mg, Oral, BID, Almyra Deforest, PA, 5 mg at 06/24/16 1033 .  MEDLINE mouth rinse, 15 mL, Mouth Rinse, BID, Cherene Altes, MD, 15 mL at 06/24/16 1000 .  pantoprazole (PROTONIX) EC tablet 40 mg, 40 mg, Oral, Q1200, Cherene Altes, MD, 40 mg at 06/23/16 1321 .  sodium chloride (OCEAN) 0.65 % nasal spray 1 spray, 1 spray, Each Nare, PRN, Bethany Molt, DO .  sodium chloride flush (NS) 0.9 % injection 10-40 mL, 10-40 mL, Intracatheter, Q12H, Rush Farmer, MD, 10 mL at 06/24/16 1039 .  sodium chloride flush (NS) 0.9 % injection 10-40 mL,  10-40 mL, Intracatheter, PRN, Rush Farmer, MD .  tobramycin-dexamethasone [facility]) ophthalmic ointment, , Right Eye, BID, Rush Farmer, MD  Patients Current Diet: Diet Heart Room service appropriate? Yes; Fluid consistency: Thin  Precautions / Restrictions Precautions Precautions: Fall Precaution Comments: vent, ETT, panda, rectal pouch Restrictions Weight Bearing Restrictions: No   Has the patient had 2 or more falls or a fall with injury in the past year?No  Prior Activity Level Community (5-7x/wk): Worked FT, went out daily, was driving.  Home Assistive Devices / Equipment Home Assistive Devices/Equipment: BIPAP Home Equipment: Cane - quad, Shower seat - built in  Prior Device Use: Indicate devices/aids used by the patient prior to current illness, exacerbation  or injury? Quad cane when he has a gout flare.  Prior Functional Level Prior Function Level of Independence: Independent  Self Care: Did the patient need help bathing, dressing, using the toilet or eating?  Independent  Indoor Mobility: Did the patient need assistance with walking from room to room (with or without device)? Independent  Stairs: Did the patient need assistance with internal or external stairs (with or without device)? Independent  Functional Cognition: Did the patient need help planning regular tasks such as shopping or remembering to take medications? Independent  Current Functional Level Cognition  Overall Cognitive Status: Within Functional Limits for tasks assessed Difficult to assess due to: Intubated Orientation Level: Oriented X4    Extremity Assessment (includes Sensation/Coordination)  Upper Extremity Assessment: Defer to OT evaluation  Lower Extremity Assessment: Overall WFL for tasks assessed    ADLs       Mobility  Overal bed mobility: Needs Assistance Bed Mobility: Sit to Supine Rolling: Min assist Sidelying to sit: Mod assist, +2 for physical assistance Supine to  sit: +2 for physical assistance, Mod assist Sit to supine: Min guard General bed mobility comments: increased time but able to complete with v/c's for sequencing/direction but no physical assist needed    Transfers  Overall transfer level: Needs assistance Equipment used: Rolling walker (2 wheeled) Transfer via Lift Equipment: [employer] Transfers: Sit to/from Stand Sit to Stand: Min assist Squat pivot transfers: Max assist, +2 physical assistance General transfer comment: v/c's for hand placement to push off arm rests, increased time    Ambulation / Gait / Stairs / Emergency planning/management officer  Ambulation/Gait Ambulation/Gait assistance: Min assist, +2 safety/equipment Ambulation Distance (Feet): 50 Feet (x2) Assistive device: Rolling walker (2 wheeled) Gait Pattern/deviations: Step-through pattern, Trunk flexed General Gait Details: v/c's for upright posture and decreased UE dependence. required 5 min seated rest break between bouts Gait velocity: decreased Gait velocity interpretation: Below normal speed for age/gender    Posture / Balance Dynamic Sitting Balance Sitting balance - Comments: UE support Balance Overall balance assessment: Needs assistance Sitting-balance support: No upper extremity supported, Feet supported Sitting balance-Leahy Scale: Fair Sitting balance - Comments: UE support Postural control: Right lateral lean Standing balance support: Bilateral upper extremity supported Standing balance-Leahy Scale: Poor Standing balance comment: dependent on RW    Special needs/care consideration BiPAP/CPAP Yes, BIpap CPM No Continuous Drip IV No Dialysis No       Life Vest No Oxygen Yes, on 02 at 2L [place] Special Bed No Trach Size No Wound Vac (area) No      Skin Has a back wound per documentation                            Bowel mgmt: Last BM 06/24/16 Bladder mgmt: Voiding in urinal Diabetic mgmt No    Previous Home Environment Living Arrangements: Spouse/significant  other, Children Available Help at Discharge: Family, Available 24 hours/day Type of Home: House Home Layout: Two level, 1/2 bath on main level, Able to live on main level with bedroom/bathroom Alternate Level Stairs-Rails: Right Alternate Level Stairs-Number of Steps: 12 Home Access: Stairs to enter Entrance Stairs-Rails: None Entrance Stairs-Number of Steps: 2 Home Care Services: No Additional Comments: Per pt, child is in a wheelchair.  They have to assist child with B/D but once they get child in wheelchair he is Modif I in the home and they both work.   Discharge Living Setting Plans for Discharge Living Setting: Patient's home,  House, Lives with (comment) (Lives with wife and 57 yo son.) Type of Home at Discharge: House Discharge Home Layout: Two level, 1/2 bath on main level, Bed/bath upstairs Alternate Level Stairs-Number of Steps: 12-13 Discharge Home Access: Stairs to enter Entrance Stairs-Number of Steps: 4 Does the patient have any problems obtaining your medications?: No  Social/Family/Support Systems Patient Roles: Spouse, Parent (Has a wife and 2 sons.  One son in W/C at home.) Contact Information: Wyeth Hoffer [phone] Anticipated Caregiver: wife and sons Ability/Limitations of Caregiver: Wife works PT here at [employer]. Caregiver Availability: 24/7 Discharge Plan Discussed with Primary Caregiver: Yes Is Caregiver In Agreement with Plan?: Yes Does Caregiver/Family have Issues with Lodging/Transportation while Pt is in Rehab?: No  Goals/Additional Needs Patient/Family Goal for Rehab: PT/OT mod I and supervision goals Expected length of stay: 8-13 days Cultural Considerations: None Dietary Needs: Heart diet, thin liquids Equipment Needs: TBD Pt/Family Agrees to Admission and willing to participate: Yes Program Orientation Provided & Reviewed with Pt/Caregiver Including Roles  & Responsibilities: Yes  Decrease burden of Care through IP rehab admission:  N/A  Possible need for SNF placement upon discharge: Not anticipated  Patient Condition: This patient's medical and functional status has changed since the consult dated: 06/22/16 in which the Rehabilitation Physician determined and documented that the patient's condition is appropriate for intensive rehabilitative care in an inpatient rehabilitation facility. See "History of Present Illness" (above) for medical update. Functional changes are: Currently requiring min assist to ambulate 50 feet X 2 RW. Patient's medical and functional status update has been discussed with the Rehabilitation physician and patient remains appropriate for inpatient rehabilitation. Will admit to inpatient rehab today.  Preadmission Screen Completed By:  Retta Diones, 06/24/2016 11:04 AM ______________________________________________________________________   Discussed status with Dr.  Naaman Plummer on 06/24/16 at 1104 and received telephone approval for admission today.  Admission Coordinator:  Retta Diones, time 1104/Date 06/24/16

## 2016-06-24 NOTE — Progress Notes (Signed)
Retta Diones, RN Rehab Admission Coordinator Signed Physical Medicine and Rehabilitation  PMR Pre-admission Date of Service: 06/24/2016 10:38 AM  Related encounter: ED to Hosp-Admission (Current) from 06/06/2016 in [facility]       '[]'$ Hide copied text PMR Admission Coordinator Pre-Admission Assessment  Patient: [name] is an 56 y.o., male MRN: [id number] DOB: [birth year] Height: '5\' 6"'$  (167.6 cm) (measure per RRT) Weight: 97.7 kg (215 lb 6.4 oz)                                                                                                                                                  Insurance Information HMO:     PPO: Yes     PCP:       IPA:       80/20:       OTHER:  Group # [id number] PRIMARY:  [employer]      Policy#:  [id number]      Subscriber:  [name] Name: [name]      Phone#: [phone]     Fax#:   Pre-Cert#: [id number]      Employer: [employer] work Benefits:  Phone #: [phone]     Name:  [name]. Date: 03/07/16     Deduct:  $200 (met $200)      Out of Pocket Max:  $2250 (met [phone])      Life Max:  N/A CIR:  90%/10%      SNF: 100% with 90 days max Outpatient: 60 combined visits PT/OT/SLP     Co-Pay:  $10/visit Home Health: 100%      Co-Pay: none DME: 80%     Co-Pay: 20% Providers: in network  Medicaid Application Date:        Case Manager:   Disability Application Date:        Case Worker:    Emergency Tax adviser Information    Name Relation Home Work Mobile   [name],Jamie Spouse [phone]     Current Medical History  Patient Admitting Diagnosis:  Debility  History of Present Illness: A 56 y.o.right handed malewith history of diastolic congestive heart failure. Hemidiaphragm status post para ganglioglioma resection, CKD stage III, CAD-ICD, right corneal eye surgery. Per chart review patient lives with spouse as well as wheelchair bound teenager who is  independent from wheelchair level. Reported to be independent prior to admissionand still working as well as his wife works. Two-level home with bed and bathroom on first floor. He presented 06/06/2016 with increasing shortness of breath and diaphoresis. Oxygen saturations 83%. Troponin negative. Noted creatinine of 2.40 from baseline 1.71. Hyperkalemia 6.4. Chest x-ray negative for acute process. Initially treated for CHF exacerbation with BiPAP, IV Lasix. Patient later required intubation for respiratory distress. Blood cultures grew strep  viridans in one of 2 bottles. Maintain on broad-spectrum antibiotics transitioned to Rocephin and antibiotic therapies have been completed.. Echocardiogram with ejection fraction of 20% diffuse hypokinesis no PFO. TEE moderate to severe mitral regurgitation no vegetation. Renal ultrasound negative for hydronephrosis with follow-up per renal services and maintain on gentle IV fluids withcreatinine improved 1.81. Patient remained intubated until 06/19/2016. Subcutaneous heparin added for DVT prophylaxis. .Diet has been advanced to a regular consistency. Physical and occupational therapy evaluation completed and ongoing with recommendations of physical medicine rehabilitation consult.Patient to be admitted for a comprehensive inpatient rehabilitation program.   Past Medical History      Past Medical History:  Diagnosis Date  . Arthritis    GOUT  . Automatic implantable cardioverter-defibrillator in situ   . Biventricular ICD (implantable cardiac defibrillator) Medtronic ]    DOI 2008/ upgrade 2010/ Gen Change 2014  . Bladder tumor    PT HOSP AT [facility] 4/24 TO 4/26 2014 WITH UTI--AND FOUND TO HAVE BLADDER TUMOR-  . Cardiomyopathy    Idiopathic dilated;   . CHF (congestive heart failure) (HCC)   . CKD (chronic kidney disease)    stage III baseline Crt 1.8-2.0  . Coronary artery disease   . Gout 08/03/12   PT C/O OF RIGHT KNEE PAIN AND SWELLING  -STATES GOUT FLARE UP - ONGOING SINCE APRIL - BUT SWELLING W/IN LAST WEEK  . Hilar mass    Noted CT 2010  . HTN (hypertension)    Dr. Graciela Husbands cardiologist  . Hypothyroidism   . 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 Sleep apnea    CPAP  . Systolic CHF (HCC)    EF 15%  . Ventricular tachycardia [facility] [address])    s/p ICD    Family History  Family history is unknown by patient.  Prior Rehab/Hospitalizations: No previous rehab  Has the patient had major surgery during 100 days prior to admission? No  Current Medications   Current Facility-Administered Medications:  .  ALPRAZolam (XANAX) tablet 0.25-0.5 mg, 0.25-0.5 mg, Oral, TID PRN, Lonia Blood, MD, 0.25 mg at 06/20/16 1959 .  atorvastatin (LIPITOR) tablet 40 mg, 40 mg, Oral, QHS, Lonia Blood, MD, 40 mg at 06/23/16 2122 .  carvedilol (COREG) tablet 3.125 mg, 3.125 mg, Oral, BID WC, Janan Ridge, MD, 3.125 mg at 06/24/16 1034 .  Chlorhexidine Gluconate Cloth 2 % PADS 6 each, 6 each, Topical, Daily, Alyson Reedy, MD, 6 each at 06/21/16 [phone] .  colchicine tablet 0.6 mg, 0.6 mg, Oral, Daily, Eulah Pont, MD, 0.6 mg at 06/24/16 1032 .  feeding supplement (ENSURE ENLIVE) (ENSURE ENLIVE) liquid 237 mL, 237 mL, Oral, BID BM, Lonia Blood, MD, 237 mL at 06/23/16 1400 .  guaiFENesin (MUCINEX) 12 hr tablet 600 mg, 600 mg, Oral, BID PRN, Eulah Pont, MD .  heparin injection 5,000 Units, 5,000 Units, Subcutaneous, Q8H, Karl Ito, MD, 5,000 Units at 06/24/16 0547 .  hydrALAZINE (APRESOLINE) tablet 10 mg, 10 mg, Oral, Q8H, Janan Ridge, MD, 10 mg at 06/24/16 0547 .  HYDROcodone-acetaminophen (NORCO/VICODIN) 5-325 MG per tablet 1 tablet, 1 tablet, Oral, Q6H PRN, Leda Gauze, NP, 1 tablet at 06/23/16 2122 .  isosorbide dinitrate (ISORDIL) tablet 5 mg, 5 mg, Oral, BID, Azalee Course, PA, 5 mg at 06/24/16 1033 .  MEDLINE mouth rinse, 15 mL,  Mouth Rinse, BID, Lonia Blood, MD, 15 mL at 06/24/16 1000 .  pantoprazole (PROTONIX) EC tablet  40 mg, 40 mg, Oral, Q1200, Cherene Altes, MD, 40 mg at 06/23/16 1321 .  sodium chloride (OCEAN) 0.65 % nasal spray 1 spray, 1 spray, Each Nare, PRN, Bethany Molt, DO .  sodium chloride flush (NS) 0.9 % injection 10-40 mL, 10-40 mL, Intracatheter, Q12H, Rush Farmer, MD, 10 mL at 06/24/16 1039 .  sodium chloride flush (NS) 0.9 % injection 10-40 mL, 10-40 mL, Intracatheter, PRN, Rush Farmer, MD .  tobramycin-dexamethasone [facility]) ophthalmic ointment, , Right Eye, BID, Rush Farmer, MD  Patients Current Diet: Diet Heart Room service appropriate? Yes; Fluid consistency: Thin  Precautions / Restrictions Precautions Precautions: Fall Precaution Comments: vent, ETT, panda, rectal pouch Restrictions Weight Bearing Restrictions: No   Has the patient had 2 or more falls or a fall with injury in the past year?No  Prior Activity Level Community (5-7x/wk): Worked FT, went out daily, was driving.  Home Assistive Devices / Equipment Home Assistive Devices/Equipment: BIPAP Home Equipment: Cane - quad, Shower seat - built in  Prior Device Use: Indicate devices/aids used by the patient prior to current illness, exacerbation or injury? Quad cane when he has a gout flare.  Prior Functional Level Prior Function Level of Independence: Independent  Self Care: Did the patient need help bathing, dressing, using the toilet or eating?  Independent  Indoor Mobility: Did the patient need assistance with walking from room to room (with or without device)? Independent  Stairs: Did the patient need assistance with internal or external stairs (with or without device)? Independent  Functional Cognition: Did the patient need help planning regular tasks such as shopping or remembering to take medications? Independent  Current Functional Level Cognition  Overall Cognitive Status:  Within Functional Limits for tasks assessed Difficult to assess due to: Intubated Orientation Level: Oriented X4    Extremity Assessment (includes Sensation/Coordination)  Upper Extremity Assessment: Defer to OT evaluation  Lower Extremity Assessment: Overall WFL for tasks assessed    ADLs       Mobility  Overal bed mobility: Needs Assistance Bed Mobility: Sit to Supine Rolling: Min assist Sidelying to sit: Mod assist, +2 for physical assistance Supine to sit: +2 for physical assistance, Mod assist Sit to supine: Min guard General bed mobility comments: increased time but able to complete with v/c's for sequencing/direction but no physical assist needed    Transfers  Overall transfer level: Needs assistance Equipment used: Rolling walker (2 wheeled) Transfer via Lift Equipment: [employer] Transfers: Sit to/from Stand Sit to Stand: Min assist Squat pivot transfers: Max assist, +2 physical assistance General transfer comment: v/c's for hand placement to push off arm rests, increased time    Ambulation / Gait / Stairs / Emergency planning/management officer  Ambulation/Gait Ambulation/Gait assistance: Min assist, +2 safety/equipment Ambulation Distance (Feet): 50 Feet (x2) Assistive device: Rolling walker (2 wheeled) Gait Pattern/deviations: Step-through pattern, Trunk flexed General Gait Details: v/c's for upright posture and decreased UE dependence. required 5 min seated rest break between bouts Gait velocity: decreased Gait velocity interpretation: Below normal speed for age/gender    Posture / Balance Dynamic Sitting Balance Sitting balance - Comments: UE support Balance Overall balance assessment: Needs assistance Sitting-balance support: No upper extremity supported, Feet supported Sitting balance-Leahy Scale: Fair Sitting balance - Comments: UE support Postural control: Right lateral lean Standing balance support: Bilateral upper extremity supported Standing balance-Leahy  Scale: Poor Standing balance comment: dependent on RW    Special needs/care consideration BiPAP/CPAP Yes, BIpap CPM No Continuous Drip IV No Dialysis No  Life Vest No Oxygen Yes, on 02 at 2L [place] Special Bed No Trach Size No Wound Vac (area) No      Skin Has a back wound per documentation                            Bowel mgmt: Last BM 06/24/16 Bladder mgmt: Voiding in urinal Diabetic mgmt No    Previous Home Environment Living Arrangements: Spouse/significant other, Children Available Help at Discharge: Family, Available 24 hours/day Type of Home: House Home Layout: Two level, 1/2 bath on main level, Able to live on main level with bedroom/bathroom Alternate Level Stairs-Rails: Right Alternate Level Stairs-Number of Steps: 12 Home Access: Stairs to enter Entrance Stairs-Rails: None Entrance Stairs-Number of Steps: 2 Home Care Services: No Additional Comments: Per pt, child is in a wheelchair.  They have to assist child with B/D but once they get child in wheelchair he is Modif I in the home and they both work.   Discharge Living Setting Plans for Discharge Living Setting: Patient's home, House, Lives with (comment) (Lives with wife and 48 yo son.) Type of Home at Discharge: House Discharge Home Layout: Two level, 1/2 bath on main level, Bed/bath upstairs Alternate Level Stairs-Number of Steps: 12-13 Discharge Home Access: Stairs to enter Entrance Stairs-Number of Steps: 4 Does the patient have any problems obtaining your medications?: No  Social/Family/Support Systems Patient Roles: Spouse, Parent (Has a wife and 2 sons.  One son in W/C at home.) Contact Information: Lamone Ferrelli [phone] Anticipated Caregiver: wife and sons Ability/Limitations of Caregiver: Wife works PT here at [employer]. Caregiver Availability: 24/7 Discharge Plan Discussed with Primary Caregiver: Yes Is Caregiver In Agreement with Plan?: Yes Does Caregiver/Family have Issues with  Lodging/Transportation while Pt is in Rehab?: No  Goals/Additional Needs Patient/Family Goal for Rehab: PT/OT mod I and supervision goals Expected length of stay: 8-13 days Cultural Considerations: None Dietary Needs: Heart diet, thin liquids Equipment Needs: TBD Pt/Family Agrees to Admission and willing to participate: Yes Program Orientation Provided & Reviewed with Pt/Caregiver Including Roles  & Responsibilities: Yes  Decrease burden of Care through IP rehab admission: N/A  Possible need for SNF placement upon discharge: Not anticipated  Patient Condition: This patient's medical and functional status has changed since the consult dated: 06/22/16 in which the Rehabilitation Physician determined and documented that the patient's condition is appropriate for intensive rehabilitative care in an inpatient rehabilitation facility. See "History of Present Illness" (above) for medical update. Functional changes are: Currently requiring min assist to ambulate 50 feet X 2 RW. Patient's medical and functional status update has been discussed with the Rehabilitation physician and patient remains appropriate for inpatient rehabilitation. Will admit to inpatient rehab today.  Preadmission Screen Completed By:  Retta Diones, 06/24/2016 11:04 AM ______________________________________________________________________   Discussed status with Dr.  Naaman Plummer on 06/24/16 at 1104 and received telephone approval for admission today.  Admission Coordinator:  Retta Diones, time 1104/Date 06/24/16       Cosigned by: Meredith Staggers, MD at 06/24/2016 11:25 AM  Revision History

## 2016-06-24 NOTE — Progress Notes (Signed)
Pt arrived to room 4MW05 from 3E27, accompanied by wife.  A&Ox4, denies pain at present, VSS. Oriented to room and rehab process. See FS for full assessment.

## 2016-06-24 NOTE — Progress Notes (Signed)
Report called to [facility] on 4th floor rehab.  Pt being transferred.  Karie Kirks, RN

## 2016-06-24 NOTE — Progress Notes (Signed)
Patient slept well last night, no concerns or complaints. Vital signs stable, oxygen saturations within normal limits, & patient expresses a need to shower, but feels he may not have the strength to stand long.Marland Kitchen

## 2016-06-24 NOTE — H&P (Signed)
Physical Medicine and Rehabilitation Admission H&P       Chief Complaint  Patient presents with  . Shortness of Breath  : HPI: Jamie Tews Streeteris a 56 y.o.right handed malewith history of diastolic congestive heart failure. Hemidiaphragm status post para ganglioglioma resection, CKD stage III, CAD-ICD, right corneal eye surgery. Per chart review patient lives with spouse as well as wheelchair bound teenager who is independent from wheelchair level. Reported to be independent prior to admissionand still working as well as his wife works. Two-level home with bed and bathroom on first floor. He presented 06/06/2016 with increasing shortness of breath and diaphoresis. Oxygen saturations 83%. Troponin negative. Noted creatinine of 2.40 from baseline 1.71. Hyperkalemia 6.4. Chest x-ray negative for acute process. Initially treated for CHF exacerbation with BiPAP, IV Lasix. Patient later required intubation for respiratory distress. Blood cultures grew strep viridans in one of 2 bottles. Maintain on broad-spectrum antibiotics transitioned to Rocephin and antibiotic therapies have been completed.. Echocardiogram with ejection fraction of 20% diffuse hypokinesis no PFO. TEE moderate to severe mitral regurgitation no vegetation. Renal ultrasound negative for hydronephrosis with follow-up per renal services and maintain on gentle IV fluids with creatinine improved 1.81. Patient remained intubated until 06/19/2016. Subcutaneous heparin added for DVT prophylaxis. .Diet has been advanced to a regular consistency. Physical and occupational therapy evaluation completed and ongoing with recommendations of physical medicine rehabilitation consult. Patient was admitted for a comprehensive rehabilitation program  Review of Systems  Constitutional: Negative for chills and fever.  HENT: Negative for hearing loss.   Eyes: Positive for blurred vision.  Respiratory: Positive for shortness of breath. Negative  for cough.   Cardiovascular: Positive for palpitations and leg swelling. Negative for chest pain.  Gastrointestinal: Positive for constipation. Negative for nausea and vomiting.  Genitourinary: Positive for urgency. Negative for dysuria and hematuria.  Musculoskeletal: Positive for joint pain and myalgias.  Skin: Negative for rash.  Neurological: Positive for weakness. Negative for seizures.  All other systems reviewed and are negative.      Past Medical History:  Diagnosis Date  . Arthritis    GOUT  . Automatic implantable cardioverter-defibrillator in situ   . Biventricular ICD (implantable cardiac defibrillator) [employer] ]    DOI 2008/ upgrade 2010/ Gen Change 2014  . Bladder tumor    PT HOSP AT [facility] 4/24 TO 4/26 2014 WITH UTI--AND FOUND TO HAVE BLADDER TUMOR-  . Cardiomyopathy    Idiopathic dilated;   . CHF (congestive heart failure) ([place])   . CKD (chronic kidney disease)    stage III baseline Crt 1.8-2.0  . Coronary artery disease   . Gout 08/03/12   PT C/O OF RIGHT KNEE PAIN AND SWELLING -STATES GOUT FLARE UP - ONGOING SINCE APRIL - BUT SWELLING W/IN LAST WEEK  . Hilar mass    Noted CT 2010  . HTN (hypertension)    Dr. Caryl Comes cardiologist  . Hypothyroidism   . 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 Sleep apnea    CPAP  . Systolic CHF ([place])    EF 10%  . Ventricular tachycardia [facility])    s/p ICD        Past Surgical History:  Procedure Laterality Date  . BIV ICD GENERTAOR CHANGE OUT     DOI 2008/ upgrade 2010/ Gen Change 2014   . CARDIAC CATHETERIZATION     he was found to have normal coronary arteries but with a globally dilated and  hypocontractile heart  . CARDIAC DEFIBRILLATOR PLACEMENT     DOI 2008/ upgrade 2010/ Gen Change 2014   . CHEST TUBE INSERTION  09/10/2013  . CHOLECYSTECTOMY    . cornea replacement    . ENDOBRONCHIAL ULTRASOUND Bilateral 10/01/2012    Procedure: ENDOBRONCHIAL ULTRASOUND;  Surgeon: Leslye Peer, MD;  Location: [facility] ENDOSCOPY;  Service: Cardiopulmonary;  Laterality: Bilateral;  . IMPLANTABLE CARDIOVERTER DEFIBRILLATOR (ICD) GENERATOR CHANGE N/A 05/02/2012   Procedure: ICD GENERATOR CHANGE;  Surgeon: Duke Salvia, MD;  Location: [facility] CATH LAB;  Service: Cardiovascular;  Laterality: N/A;  . MEDIASTINOSCOPY N/A 10/25/2012   Procedure: MEDIASTINOSCOPY;  Surgeon: Kerin Perna, MD;  Location: [facility] OR;  Service: Thoracic;  Laterality: N/A;  . MEDIASTINOTOMY  09/10/2013   paratracheal mass    DR Maren Beach  . MEDIASTINOTOMY CHAMBERLAIN MCNEIL Right 09/10/2013   Procedure: MEDIASTINOTOMY CHAMBERLAIN MCNEIL;  Surgeon: Kerin Perna, MD;  Location: [facility] OR;  Service: Thoracic;  Laterality: Right;  . RESECTION OF MEDIASTINAL MASS Right 02/13/2014   Procedure: RESECTION OF MEDIASTINAL MASS;  Surgeon: Kerin Perna, MD;  Location: [facility] OR;  Service: Thoracic;  Laterality: Right;  PATIENT NEEDS EPIDURAL CATHETER AND A SWAN GANZ CATHETER (DR. CHRIS MOSER AWARE PER PVT)  . RIGHT HEART CATHETERIZATION N/A 01/27/2012   Procedure: RIGHT HEART CATH;  Surgeon: Dolores Patty, MD;  Location: [facility] CATH LAB;  Service: Cardiovascular;  Laterality: N/A;  . TONSILLECTOMY    . TRANSURETHRAL RESECTION OF BLADDER TUMOR N/A 08/13/2012   Procedure: TRANSURETHRAL RESECTION OF BLADDER TUMOR (TURBT);  Surgeon: Garnett Farm, MD;  Location: [facility] ORS;  Service: Urology;  Laterality: N/A;  MITOMYCIN C  . US ECHOCARDIOGRAPHY  03-17-2008, 07-03-2006   EF 15-20%, EF 15-20%  . VIDEO ASSISTED THORACOSCOPY (VATS)/THOROCOTOMY Right 02/13/2014   Procedure: VIDEO ASSISTED THORACOSCOPY (VATS)/THOROCOTOMY;  Surgeon: Kerin Perna, MD;  Location[facility] OR;  Service: Thoracic;  Laterality: Right;  PATIENT NEEDS EPIDURAL CATHETER (DR. CHRIS MOSER AWARE PER PVT)  . VIDEO BRONCHOSCOPY N/A 10/25/2012   Procedure: VIDEO BRONCHOSCOPY;  Surgeon: Kerin Perna, MD;  Location:  [facility] OR;  Service: Thoracic;  Laterality: N/A;        Family History  Problem Relation Age of Onset  . Family history unknown: Yes   Social History:  reports that he has never smoked. He has never used smokeless tobacco. He reports that he does not drink alcohol or use drugs. Allergies:       Allergies  Allergen Reactions  . Bidil [Isosorb Dinitrate-Hydralazine] Other (See Comments)    Headaches   . Spironolactone Other (See Comments)    Hyperkalemia   . Hydralazine Hcl Other (See Comments)    "Fuzzy headed" feeling         Medications Prior to Admission  Medication Sig Dispense Refill  . atorvastatin (LIPITOR) 40 MG tablet TAKE 1 TABLET BY MOUTH AT BEDTIME 30 tablet 2  . BREO ELLIPTA 100-25 MCG/INH AEPB Inhale 1 puff into the lungs daily as needed (for shortness of breath).   11  . carvedilol (COREG) 12.5 MG tablet Take 6.25 mg by mouth 2 (two) times daily with a meal.     . gabapentin (NEURONTIN) 300 MG capsule Take 300 mg by mouth 2 (two) times daily. Pain    . hydrALAZINE (APRESOLINE) 50 MG tablet Take 1 tablet (50 mg total) by mouth 2 (two) times daily. (Patient taking differently: Take 25 mg by mouth 2 (two) times daily. ) 180 tablet 3  .  HYDROcodone-acetaminophen (NORCO/VICODIN) 5-325 MG tablet Take 0.5 tablets by mouth 2 (two) times daily as needed (for pain).     . magnesium oxide (MAG-OX) 400 MG tablet Take two tablets (800 mg) by mouth twice daily 120 tablet 11  . omeprazole (PRILOSEC) 20 MG capsule Take 20 mg by mouth at bedtime.    Marland Kitchen tobramycin-dexamethasone (TOBRADEX) ophthalmic solution Place 1 drop into the right eye 2 (two) times daily.    Marland Kitchen torsemide (DEMADEX) 10 MG tablet Take 10 mg by mouth daily. Or as directed by your physician    . MITIGARE 0.6 MG CAPS Take 0.6 mg by mouth daily. (Patient not taking: Reported on 06/07/2016) 30 capsule 6    Home: Home Living Family/patient expects to be discharged to:: Private residence Living  Arrangements: Spouse/significant other, Children Available Help at Discharge: Family, Available 24 hours/day Type of Home: House Home Access: Stairs to enter [employer] of Steps: 2 Entrance Stairs-Rails: None Home Layout: Two level, 1/2 bath on main level, Able to live on main level with bedroom/bathroom Alternate Level Stairs-Number of Steps: 12 Alternate Level Stairs-Rails: Right Home Equipment: Cane - quad, Shower seat - built in Additional Comments: Per pt, child is in a wheelchair.  They have to assist child with B/D but once they get child in wheelchair he is Modif I in the home and they both work.    Functional History: Prior Function Level of Independence: Independent  Functional Status:  Mobility: Bed Mobility Overal bed mobility: Needs Assistance Bed Mobility: Sit to Supine Rolling: Min assist Sidelying to sit: Mod assist, +2 for physical assistance Supine to sit: +2 for physical assistance, Mod assist Sit to supine: Min guard General bed mobility comments: increased time but able to complete with v/c's for sequencing/direction but no physical assist needed Transfers Overall transfer level: Needs assistance Equipment used: Rolling walker (2 wheeled) Transfer via Lift Equipment: [employer] Transfers: Sit to/from Stand Sit to Stand: Min assist Squat pivot transfers: Max assist, +2 physical assistance General transfer comment: v/c's for hand placement to push off arm rests, increased time Ambulation/Gait Ambulation/Gait assistance: Min assist, +2 safety/equipment Ambulation Distance (Feet): 50 Feet (x2) Assistive device: Rolling walker (2 wheeled) Gait Pattern/deviations: Step-through pattern, Trunk flexed General Gait Details: v/c's for upright posture and decreased UE dependence. required 5 min seated rest break between bouts Gait velocity: decreased Gait velocity interpretation: Below normal speed for age/gender  ADL:  Cognition: Cognition Overall  Cognitive Status: Within Functional Limits for tasks assessed Orientation Level: Oriented X4 Cognition Arousal/Alertness: Awake/alert Behavior During Therapy: WFL for tasks assessed/performed Overall Cognitive Status: Within Functional Limits for tasks assessed Difficult to assess due to: Intubated  Physical Exam: Blood pressure 122/89, pulse 87, temperature 97.8 F (36.6 C), temperature source Oral, resp. rate 20, height '5\' 6"'$  (1.676 m), weight 97.7 kg (215 lb 6.4 oz), SpO2 100 %. Physical Exam  Constitutional:  obese  HENT:  Head: Normocephalic.  Eyes:  Right eye decreased vision, opaque  Neck: Neck supple. No thyromegaly present.  Cardiovascular: Normal rate and regular rhythm.   No murmur heard. Respiratory: Effort normal and breath sounds normal. No respiratory distress. He has no wheezes. He has no rales.  GI: Soft. Bowel sounds are normal. He exhibits no distension. There is no tenderness. There is no rebound.  Skin warm and dry. A few ecchmoses Ext: 1+ edema bilateral LE Neurological: He is alertand oriented to person, place, and time.  Follow full commands Sensation intact to light touch Motor: 4-4+/5 bilateral UE's.  LE: 4- hf, 4/5 ke and 4/5 adf/pf.  Psych: pleasant, a bit flat. cooperative   Lab Results Last 48 Hours        Results for orders placed or performed during the hospital encounter of 06/06/16 (from the past 48 hour(s))  Basic metabolic panel     Status: Abnormal   Collection Time: 06/24/16  3:33 AM  Result Value Ref Range   Sodium 132 (L) 135 - 145 mmol/L   Potassium 5.3 (H) 3.5 - 5.1 mmol/L   Chloride 99 (L) 101 - 111 mmol/L   CO2 23 22 - 32 mmol/L   Glucose, Bld 109 (H) 65 - 99 mg/dL   BUN 61 (H) 6 - 20 mg/dL   Creatinine, Ser 1.81 (H) 0.61 - 1.24 mg/dL   Calcium 9.5 8.9 - 10.3 mg/dL   GFR calc non Af Amer 40 (L) >60 mL/min   GFR calc Af Amer 46 (L) >60 mL/min    Comment: (NOTE) The eGFR has been calculated using the CKD EPI  equation. This calculation has not been validated in all clinical situations. eGFR's persistently <60 mL/min signify possible Chronic Kidney Disease.    Anion gap 10 5 - 15     Imaging Results (Last 48 hours)  No results found.       Medical Problem List and Plan: 1.  Debilitation secondary to CHF exacerbation with sepsis             -admit to inpatient rehab 2.  DVT Prophylaxis/Anticoagulation: Subcutaneous heparin. Monitor for any bleeding episodes. Patient is ambulatory 3. Pain Management: Hydrocodone as needed 4. Mood: Xanax 0.25-0.5 mg 3 times a day as needed 5. Neuropsych: This patient is capable of making decisions on his own behalf. 6. Skin/Wound Care: Routine skin checks 7. Fluids/Electrolytes/Nutrition: Routine I&O with follow-up chemistries upon admit 8. Hypertension. Coreg 3.125 mg twice a day, hydralazine 10 mg 3 times a day, Isordil 5 mg twice a day 9. CKD stage III. Follow-up chemistries as indicated             -follow I's and O's 10. CAD-ICD. No chest pain or shortness of breath 11. ID. Streptococcus viridans bacteremia. Completed anti-microbial antibiotic therapy. 12. Hyperlipidemia. Lipitor 13. History of gout. Colchicine 0.6 mg daily. Monitor for any flareup   Post Admission Physician Evaluation: 1. Functional deficits secondary  to debility after multiple medical. 2. Patient is admitted to receive collaborative, interdisciplinary care between the physiatrist, rehab nursing staff, and therapy team. 3. Patient's level of medical complexity and substantial therapy needs in context of that medical necessity cannot be provided at a lesser intensity of care such as a SNF. 4. Patient has experienced substantial functional loss from his/her baseline which was documented above under the "Functional History" and "Functional Status" headings.  Judging by the patient's diagnosis, physical exam, and functional history, the patient has potential for functional  progress which will result in measurable gains while on inpatient rehab.  These gains will be of substantial and practical use upon discharge  in facilitating mobility and self-care at the household level. 5. Physiatrist will provide 24 hour management of medical needs as well as oversight of the therapy plan/treatment and provide guidance as appropriate regarding the interaction of the two. 6. The Preadmission Screening has been reviewed and patient status is unchanged unless otherwise stated above. 7. 24 hour rehab nursing will assist with bladder management, bowel management, safety, skin/wound care, disease management, medication administration, pain management and patient education  and help  integrate therapy concepts, techniques,education, etc. 8. PT will assess and treat for/with: Lower extremity strength, range of motion, stamina, balance, functional mobility, safety, adaptive techniques and equipment, pain control, community reintegration.   Goals are: mod I. 9. OT will assess and treat for/with: ADL's, functional mobility, safety, upper extremity strength, adaptive techniques and equipment, pain mgt, family ed, ego support, community reintegration.   Goals are: mod I. Therapy may proceed with showering this patient. 10. SLP will assess and treat for/with: n/a.  Goals are: n/a. 11. Case Management and Social Worker will assess and treat for psychological issues and discharge planning. 12. Team conference will be held weekly to assess progress toward goals and to determine barriers to discharge. 13. Patient will receive at least 3 hours of therapy per day at least 5 days per week. 14. ELOS: 7-10 days       15. Prognosis:  excellent     Meredith Staggers, MD, [facility] 06/24/2016  Cathlyn Parsons., PA-C 06/24/2016

## 2016-06-25 ENCOUNTER — Inpatient Hospital Stay (HOSPITAL_COMMUNITY): Payer: 59 | Admitting: Physical Therapy

## 2016-06-25 ENCOUNTER — Inpatient Hospital Stay (HOSPITAL_COMMUNITY): Payer: 59

## 2016-06-25 DIAGNOSIS — R5381 Other malaise: Principal | ICD-10-CM

## 2016-06-25 NOTE — Evaluation (Signed)
Occupational Therapy Assessment and Plan  Patient Details  Name: [name] MRN: [id number] Date of Birth: 1960-10-12  OT Diagnosis: abnormal posture and muscle weakness (generalized) Rehab Potential:   ELOS: 5-7   Today's Date: 06/25/2016 OT Individual Time: 2426-8341 OT Individual Time Calculation (min): 58 min     Problem List:  Patient Active Problem List   Diagnosis Date Noted  . Debilitated 06/24/2016  . Acute on chronic combined systolic and diastolic CHF (congestive heart failure) ([place])   . Coronary artery disease involving native coronary artery of native heart without angina pectoris   . Community acquired pneumonia   . Tachypnea   . Acute blood loss anemia   . Bacteremia due to Streptococcus 06/14/2016  . Central line infection   . Infected defibrillator ([place])   . History of ETT   . Acute on chronic respiratory failure with hypoxemia ([place])   . Pressure injury of skin 06/08/2016  . Acute respiratory failure with hypoxia ([place]) 06/06/2016  . CKD (chronic kidney disease) stage 3, GFR 30-59 ml/min 02/01/2015  . 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 CHF exacerbation ([place]) 01/31/2015  . Acute on chronic heart failure ([place]) 01/31/2015  . AKI (acute kidney injury) ([place]) 01/31/2015  . Gout flare 01/31/2015  . OSA on CPAP 01/31/2015  . Leukocytosis 01/31/2015  . 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 History of cardiac arrhythmia 09/19/2014  . CKD (chronic kidney disease) stage 2, GFR 60-89 ml/min 09/19/2014  . Other fatigue 09/19/2014  . Hip pain 09/19/2014  . History of gout 09/19/2014  . History of bladder carcinoma 09/19/2014  . 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 Chest discomfort 09/10/2013  . Bladder cancer ([place]) 11/09/2012  . Preoperative cardiovascular examination 09/11/2012  . Malignant tumor of urinary bladder ([place]) 07/13/2012  . Chronic combined systolic and diastolic CHF (congestive heart failure) ([place]) 06/28/2012  . Gout 05/17/2012  . Inappropriate  shocks from ICD -secondary T wave oversensing in the context of hyperkalemia 04/12/2012  . Automatic implantable cardioverter-defibrillator in situ 05/17/2011  . 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 ORTHOSTATIC DIZZINESS 11/03/2009  . VENTRICULAR TACHYCARDIA 10/20/2008  . SYSTOLIC HEART FAILURE, CHRONIC 10/20/2008  . SOB (shortness of breath) 03/25/2008  . Obstructive sleep apnea 03/12/2007  . Essential hypertension, benign 03/12/2007  . CARDIOMYOPATHY 03/12/2007    Past Medical History:  Past Medical History:  Diagnosis Date  . Arthritis    GOUT  . Automatic implantable cardioverter-defibrillator in situ   . Biventricular ICD (implantable cardiac defibrillator) [employer]onic ]    DOI 2008/ upgrade 2010/ Gen Change 2014  . Bladder tumor    PT HOSP AT [facility] 4/24 TO 4/26 2014 WITH UTI--AND FOUND TO HAVE BLADDER TUMOR-  . Cardiomyopathy    Idiopathic dilated;   . CHF (congestive heart failure) ([place])   . CKD (chronic kidney disease)    stage III baseline Crt 1.8-2.0  . Coronary artery disease   . Gout 08/03/12   PT C/O OF RIGHT KNEE PAIN AND SWELLING -STATES GOUT FLARE UP - ONGOING SINCE APRIL - BUT SWELLING W/IN LAST WEEK  . Hilar mass    Noted CT 2010  . HTN (hypertension)    Dr. Caryl Comes cardiologist  . Hypothyroidism   . 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 Sleep apnea    CPAP  . Systolic CHF ([place])    EF 96%  . Ventricular tachycardia ([place])    s/p ICD  Past Surgical History:  Past Surgical History:  Procedure Laterality Date  . BIV ICD GENERTAOR CHANGE OUT     DOI 2008/ upgrade 2010/ Gen Change 2014   . CARDIAC CATHETERIZATION     he was found to have normal coronary arteries but with a globally dilated and hypocontractile heart  . CARDIAC DEFIBRILLATOR PLACEMENT     DOI 2008/ upgrade 2010/ Gen Change 2014   . CHEST TUBE INSERTION  09/10/2013  . CHOLECYSTECTOMY    . cornea replacement    .  ENDOBRONCHIAL ULTRASOUND Bilateral 10/01/2012   Procedure: ENDOBRONCHIAL ULTRASOUND;  Surgeon: Collene Gobble, MD;  Location: [facility] ENDOSCOPY;  Service: Cardiopulmonary;  Laterality: Bilateral;  . IMPLANTABLE CARDIOVERTER DEFIBRILLATOR (ICD) GENERATOR CHANGE N/A 05/02/2012   Procedure: ICD GENERATOR CHANGE;  Surgeon: Deboraha Sprang, MD;  Location: [facility] CATH LAB;  Service: Cardiovascular;  Laterality: N/A;  . MEDIASTINOSCOPY N/A 10/25/2012   Procedure: MEDIASTINOSCOPY;  Surgeon: [name], MD;  Location: [place];  Service: Thoracic;  Laterality: N/A;  . MEDIASTINOTOMY  09/10/2013   paratracheal mass    DR Darcey Nora  . MEDIASTINOTOMY CHAMBERLAIN MCNEIL Right 09/10/2013   Procedure: MEDIASTINOTOMY CHAMBERLAIN MCNEIL;  Surgeon: [name], MD;  Location: [place];  Service: Thoracic;  Laterality: Right;  . RESECTION OF MEDIASTINAL MASS Right 02/13/2014   Procedure: RESECTION OF MEDIASTINAL MASS;  Surgeon: [name], MD;  Location: [facility] OR;  Service: Thoracic;  Laterality: Right;  PATIENT NEEDS EPIDURAL CATHETER AND A SWAN GANZ CATHETER (DR. CHRIS MOSER AWARE PER PVT)  . RIGHT HEART CATHETERIZATION N/A 01/27/2012   Procedure: RIGHT HEART CATH;  Surgeon: Jolaine Artist, MD;  Location: [facility] CATH LAB;  Service: Cardiovascular;  Laterality: N/A;  . TONSILLECTOMY    . TRANSURETHRAL RESECTION OF BLADDER TUMOR N/A 08/13/2012   Procedure: TRANSURETHRAL RESECTION OF BLADDER TUMOR (TURBT);  Surgeon: Claybon Jabs, MD;  Location: [facility] ORS;  Service: Urology;  Laterality: N/A;  MITOMYCIN C  . US ECHOCARDIOGRAPHY  03-17-2008, 07-03-2006   EF 15-20%, EF 15-20%  . VIDEO ASSISTED THORACOSCOPY (VATS)/THOROCOTOMY Right 02/13/2014   Procedure: VIDEO ASSISTED THORACOSCOPY (VATS)/THOROCOTOMY;  Surgeon: [name], MD;  Location: [facility] OR;  Service: Thoracic;  Laterality: Right;  PATIENT NEEDS EPIDURAL CATHETER (DR. CHRIS MOSER AWARE PER PVT)  . VIDEO BRONCHOSCOPY N/A 10/25/2012   Procedure: VIDEO BRONCHOSCOPY;   Surgeon: [name], MD;  Location: [facility] OR;  Service: Thoracic;  Laterality: N/A;    Assessment & Plan Clinical Impression: [name] a 56 y.o.right handed malewith history of diastolic congestive heart failure. Hemidiaphragm status post para ganglioglioma resection, CKD stage III, CAD-ICD, right corneal eye surgery. Per chart review patient lives with spouse as well as wheelchair bound teenager who is independent from wheelchair level. Reported to be independent prior to admissionand still working as well as his wife works. Two-level home with bed and bathroom on first floor. He presented 06/06/2016 with increasing shortness of breath and diaphoresis. Oxygen saturations 83%. Troponin negative. Noted creatinine of 2.40 from baseline 1.71. Hyperkalemia 6.4. Chest x-ray negative for acute process. Initially treated for CHF exacerbation with BiPAP, IV Lasix. Patient later required intubation for respiratory distress. Blood cultures grew strep viridans in one of 2 bottles. Maintain on broad-spectrum antibiotics transitioned to Rocephin and antibiotic therapies have been completed.. Echocardiogram with ejection fraction of 20% diffuse hypokinesis no PFO. TEE moderate to severe mitral regurgitation no vegetation. Renal ultrasound negative for hydronephrosis with follow-up per renal services and maintain on gentle  IV fluids withcreatinine improved 1.81. Patient remained intubated until 06/19/2016. Subcutaneous heparin added for DVT prophylaxis. .Diet has been advanced to a regular consistency. Physical and occupational therapy evaluation completed and ongoing with recommendations of physical medicine rehabilitation consult.Patient was admitted for a comprehensive rehabilitation program  Patient currently requires min with basic self-care skills secondary to muscle weakness and decreased cardiorespiratoy endurance and decreased oxygen support.  Prior to hospitalization, patient could complete ADLs  with modified independent .  Patient will benefit from skilled intervention to increase independence with basic self-care skills and increase level of independence with iADL prior to discharge home independently.  Anticipate patient will require no supervision/assistance and follow up home health to address IADLs.      Skilled Therapeutic Intervention 1:1. Educated pt on LOS, role/purpose of OT, CIR, and POC. Pt agreeable to bathe and dress but declines shower at this time. Pt able to bathes 10/10 body parts at sit to stand level to wash buttocks with MIN A for balance and VC for safety awareness. Pt able to stand ~2 min prior to becoming short of breath and requiring rest break with VC for pursed lip breathing. Pt dons pull over shirt with set up to obtain items and pants, socks and shoes with touching A for balance while standing. Stands at sink to brush teeth. Pt ambulates with HHA and MIN A for balance with Vc to not furniture walk. At [employer], pt places clothing into various dresser drawers with touching A. Exited session with pt seated in recliner with QRB donned and call light in reach.   Session 2: 1:1 focus on functional mobility, endurance, and standing tolerance. Pt propels w/c to from all therapeutic destinations with increased time and supervision to improve functinal endurance and BUE strength. Pt requires 1 rest break d/t fatigue. Pt completes tub transfer with RW to/from w/c with MIN A and VC for sequencing of transfer. Pt completes toilet transfer with touching assistance and VC for RW management throughout turn. In therapy gym pt pitches horse shoes in standing with MIN A for balanace and VC for foot placement. Exited session with pt seated in toilet in room. Pt able to complete clothing management prior to sitting on toilet. RN aware pt will need assistance to get up.   OT Evaluation Precautions/Restrictions  Precautions Precautions: Fall Precaution Comments: O2  dependent Restrictions Weight Bearing Restrictions: No General Chart Reviewed: Yes Vital Signs Therapy Vitals Pulse Rate: 95 BP: 124/78 Patient Position (if appropriate): Lying Pain Pain Assessment Pain Assessment: No/denies pain Home Living/Prior Functioning Home Living Available Help at Discharge: Family, Available 24 hours/day Type of Home: House Home Access: Stairs to enter [employer] of Steps: 2 Entrance Stairs-Rails: None Home Layout: Two level, 1/2 bath on main level, Able to live on main level with bedroom/bathroom Alternate Level Stairs-Number of Steps: 12 Alternate Level Stairs-Rails: Right Bathroom Shower/Tub: Tub/shower unit, Health visitor: Standard Additional Comments: Per pt, child is in a wheelchair.  They have to assist child with B/D but once they get child in wheelchair he is Modif I in the home and they both work.   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 Leisure and Hobbies: computers; music, piano Prior Function Level of Independence: Independent with basic ADLs  Able to Take Stairs?: Yes Driving: Yes  Vocation: Full time employment Comments: used quad cane for gout flare up  ADL   Vision/Perception  Vision- Assessment Additional Comments: opaque R eye Perception Perception: Within Functional Limits Praxis Praxis: Intact  Cognition Overall Cognitive Status: Within Functional Limits for tasks assessed Orientation Level: Person;Place;Situation Year: 2018 Month: April Day of Week: Correct Memory: Appears intact Immediate Memory Recall: Sock;Blue;Bed Memory Recall: Sock;Blue;Bed Memory Recall  Sock: Without Cue Memory Recall Blue: Without Cue Memory Recall Bed: Without Cue Sensation Sensation Light Touch: Appears Intact Coordination Gross Motor Movements are Fluid and Coordinated: Yes Fine Motor Movements are Fluid and Coordinated: No (slow) Finger Nose Finger Test: slow Motor  Motor Motor: Within Functional Limits Mobility  Transfers Transfers: Sit to Stand Sit to Stand: 4: Min assist  Trunk/Postural Assessment  Cervical Assessment Cervical Assessment: Within Functional Limits Thoracic Assessment Thoracic Assessment: Exceptions to [facility] (rounded shoulders) Lumbar Assessment Lumbar Assessment: Exceptions to [facility] (posterior pelvic tilt) Postural Control Postural Control: Within Functional Limits  Balance Balance Balance Assessed: Yes Dynamic Sitting Balance Sitting balance - Comments:  (MOD I) Extremity/Trunk Assessment RUE Assessment RUE Assessment: Exceptions to [facility] (generalized weakness) LUE Assessment LUE Assessment: Exceptions to [facility] (generalized weakness)   See Function Navigator for Current Functional Status.   Refer to Care Plan for Long Term Goals  Recommendations for other services: None    Discharge Criteria: Patient will be discharged from OT if patient refuses treatment 3 consecutive times without medical reason, if treatment goals not met, if there is a change in medical status, if patient makes no progress towards goals or if patient is discharged from hospital.  The above assessment, treatment plan, treatment alternatives and goals were discussed and mutually agreed upon: by patient  Tonny Branch 06/25/2016, 9:00 AM

## 2016-06-25 NOTE — Evaluation (Addendum)
Physical Therapy Assessment and Plan  Patient Details  Name: [name] MRN: [id number] Date of Birth: 03-Aug-1960  PT Diagnosis: Abnormal posture, Abnormality of gait, Difficulty walking and Muscle weakness Rehab Potential: Good ELOS:  (5 to 7 days)   Today's Date: 06/25/2016 PT Individual Time: 6295-2841 PT Individual Time Calculation (min): 56 min    Problem List:  Patient Active Problem List   Diagnosis Date Noted  . Debilitated 06/24/2016  . Acute on chronic combined systolic and diastolic CHF (congestive heart failure) ([place])   . Coronary artery disease involving native coronary artery of native heart without angina pectoris   . Community acquired pneumonia   . Tachypnea   . Acute blood loss anemia   . Bacteremia due to Streptococcus 06/14/2016  . Central line infection   . Infected defibrillator ([place])   . History of ETT   . Acute on chronic respiratory failure with hypoxemia ([place])   . Pressure injury of skin 06/08/2016  . Acute respiratory failure with hypoxia ([place]) 06/06/2016  . CKD (chronic kidney disease) stage 3, GFR 30-59 ml/min 02/01/2015  . 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 CHF exacerbation ([place]) 01/31/2015  . Acute on chronic heart failure ([place]) 01/31/2015  . AKI (acute kidney injury) ([place]) 01/31/2015  . Gout flare 01/31/2015  . OSA on CPAP 01/31/2015  . Leukocytosis 01/31/2015  . 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 History of cardiac arrhythmia 09/19/2014  . CKD (chronic kidney disease) stage 2, GFR 60-89 ml/min 09/19/2014  . Other fatigue 09/19/2014  . Hip pain 09/19/2014  . History of gout 09/19/2014  . History of bladder carcinoma 09/19/2014  . 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 Chest discomfort 09/10/2013  . Bladder cancer ([place]) 11/09/2012  . Preoperative cardiovascular examination 09/11/2012  . Malignant tumor of urinary bladder ([place]) 07/13/2012  . Chronic combined systolic and diastolic CHF (congestive heart failure) ([place]) 06/28/2012   . Gout 05/17/2012  . Inappropriate shocks from ICD -secondary T wave oversensing in the context of hyperkalemia 04/12/2012  . Automatic implantable cardioverter-defibrillator in situ 05/17/2011  . 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 ORTHOSTATIC DIZZINESS 11/03/2009  . VENTRICULAR TACHYCARDIA 10/20/2008  . SYSTOLIC HEART FAILURE, CHRONIC 10/20/2008  . SOB (shortness of breath) 03/25/2008  . Obstructive sleep apnea 03/12/2007  . Essential hypertension, benign 03/12/2007  . CARDIOMYOPATHY 03/12/2007    Past Medical History:  Past Medical History:  Diagnosis Date  . Arthritis    GOUT  . Automatic implantable cardioverter-defibrillator in situ   . Biventricular ICD (implantable cardiac defibrillator) [employer]onic ]    DOI 2008/ upgrade 2010/ Gen Change 2014  . Bladder tumor    PT HOSP AT [facility] 4/24 TO 4/26 2014 WITH UTI--AND FOUND TO HAVE BLADDER TUMOR-  . Cardiomyopathy    Idiopathic dilated;   . CHF (congestive heart failure) ([place])   . CKD (chronic kidney disease)    stage III baseline Crt 1.8-2.0  . Coronary artery disease   . Gout 08/03/12   PT C/O OF RIGHT KNEE PAIN AND SWELLING -STATES GOUT FLARE UP - ONGOING SINCE APRIL - BUT SWELLING W/IN LAST WEEK  . Hilar mass    Noted CT 2010  . HTN (hypertension)    Dr. Caryl Comes cardiologist  . Hypothyroidism   . 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 Sleep apnea    CPAP  . Systolic CHF ([place])    EF 32%  . Ventricular tachycardia ([place])  s/p ICD   Past Surgical History:  Past Surgical History:  Procedure Laterality Date  . BIV ICD GENERTAOR CHANGE OUT     DOI 2008/ upgrade 2010/ Gen Change 2014   . CARDIAC CATHETERIZATION     he was found to have normal coronary arteries but with a globally dilated and hypocontractile heart  . CARDIAC DEFIBRILLATOR PLACEMENT     DOI 2008/ upgrade 2010/ Gen Change 2014   . CHEST TUBE INSERTION  09/10/2013  . CHOLECYSTECTOMY    .  cornea replacement    . ENDOBRONCHIAL ULTRASOUND Bilateral 10/01/2012   Procedure: ENDOBRONCHIAL ULTRASOUND;  Surgeon: Collene Gobble, MD;  Location: [facility] ENDOSCOPY;  Service: Cardiopulmonary;  Laterality: Bilateral;  . IMPLANTABLE CARDIOVERTER DEFIBRILLATOR (ICD) GENERATOR CHANGE N/A 05/02/2012   Procedure: ICD GENERATOR CHANGE;  Surgeon: Deboraha Sprang, MD;  Location: [facility] CATH LAB;  Service: Cardiovascular;  Laterality: N/A;  . MEDIASTINOSCOPY N/A 10/25/2012   Procedure: MEDIASTINOSCOPY;  Surgeon: [name], MD;  Location: [place];  Service: Thoracic;  Laterality: N/A;  . MEDIASTINOTOMY  09/10/2013   paratracheal mass    DR Darcey Nora  . MEDIASTINOTOMY CHAMBERLAIN MCNEIL Right 09/10/2013   Procedure: MEDIASTINOTOMY CHAMBERLAIN MCNEIL;  Surgeon: [name], MD;  Location: [place];  Service: Thoracic;  Laterality: Right;  . RESECTION OF MEDIASTINAL MASS Right 02/13/2014   Procedure: RESECTION OF MEDIASTINAL MASS;  Surgeon: [name], MD;  Location: [facility] OR;  Service: Thoracic;  Laterality: Right;  PATIENT NEEDS EPIDURAL CATHETER AND A SWAN GANZ CATHETER (DR. CHRIS MOSER AWARE PER PVT)  . RIGHT HEART CATHETERIZATION N/A 01/27/2012   Procedure: RIGHT HEART CATH;  Surgeon: Jolaine Artist, MD;  Location: [facility] CATH LAB;  Service: Cardiovascular;  Laterality: N/A;  . TONSILLECTOMY    . TRANSURETHRAL RESECTION OF BLADDER TUMOR N/A 08/13/2012   Procedure: TRANSURETHRAL RESECTION OF BLADDER TUMOR (TURBT);  Surgeon: Claybon Jabs, MD;  Location: [facility] ORS;  Service: Urology;  Laterality: N/A;  MITOMYCIN C  . US ECHOCARDIOGRAPHY  03-17-2008, 07-03-2006   EF 15-20%, EF 15-20%  . VIDEO ASSISTED THORACOSCOPY (VATS)/THOROCOTOMY Right 02/13/2014   Procedure: VIDEO ASSISTED THORACOSCOPY (VATS)/THOROCOTOMY;  Surgeon: [name], MD;  Location: [facility] OR;  Service: Thoracic;  Laterality: Right;  PATIENT NEEDS EPIDURAL CATHETER (DR. CHRIS MOSER AWARE PER PVT)  . VIDEO BRONCHOSCOPY N/A 10/25/2012   Procedure:  VIDEO BRONCHOSCOPY;  Surgeon: [name], MD;  Location: [facility] OR;  Service: Thoracic;  Laterality: N/A;    Assessment & Plan Clinical Impression: Patient is a 56 y.o. year old male with recent admission to the hospital on 06-06-16 with 56 y.o.right handed malewith history of diastolic congestive heart failure. Hemidiaphragm status post para ganglioglioma resection, CKD stage III, CAD-ICD, right corneal eye surgery. Per chart review patient lives with spouse as well as wheelchair bound teenager who is independent from wheelchair level. Reported to be independent prior to admissionand still working as well as his wife works. Two-level home with bed and bathroom on first floor. He presented 06/06/2016 with increasing shortness of breath and diaphoresis. Oxygen saturations 83%. Troponin negative. Noted creatinine of 2.40 from baseline 1.71. Hyperkalemia 6.4. Chest x-ray negative for acute process. Initially treated for CHF exacerbation with BiPAP, IV Lasix. Patient later required intubation for respiratory distress. Blood cultures grew strep viridans in one of 2 bottles. Maintain on broad-spectrum antibiotics transitioned to Rocephin and antibiotic therapies have been completed.. Echocardiogram with ejection fraction of 20% diffuse hypokinesis no PFO. TEE moderate to severe mitral  regurgitation no vegetation. Renal ultrasound negative for hydronephrosis with follow-up per renal services and maintain on gentle IV fluids withcreatinine improved 1.81. Patient remained intubated until 06/19/2016. Subcutaneous heparin added for DVT prophylaxis. .Diet has been advanced to a regular consistency. Physical and occupational therapy evaluation completed and ongoing with recommendations of physical medicine rehabilitation consult.Patient was admitted for a comprehensive rehabilitation program.  Patient transferred to CIR on 06/24/2016 .   Patient currently requires min with mobility secondary to muscle weakness,  decreased cardiorespiratoy endurance and decreased oxygen support, decreased motor planning and decreased sitting balance, decreased standing balance and decreased balance strategies.  Prior to hospitalization, patient was independent  with mobility and lived with Spouse in a House home.  Home access is  (ramp in garage - has wheelchair bound son)Ramped entrance.  Patient will benefit from skilled PT intervention to maximize safe functional mobility, minimize fall risk and decrease caregiver burden for planned discharge home with intermittent assist.  Anticipate patient will benefit from follow up [facility] at discharge.  PT - End of Session Activity Tolerance: Tolerates < 10 min activity, no significant change in vital signs Endurance Deficit: Yes Endurance Deficit Description:  (Especially limited in stair performance and ambulation.) PT Assessment Rehab Potential (ACUTE/IP ONLY): Good Barriers to Discharge: Inaccessible home environment;Decreased caregiver support PT Patient demonstrates impairments in the following area(s): Balance;Endurance;Pain;Safety;Motor PT Transfers Functional Problem(s): Bed Mobility;Bed to Chair;Car;Furniture PT Locomotion Functional Problem(s): Ambulation;Wheelchair Mobility;Stairs PT Plan PT Intensity: Minimum of 1-2 x/day ,45 to 90 minutes PT Frequency: 5 out of 7 days PT Duration Estimated Length of Stay:  (5 to 7 days) PT Treatment/Interventions: Ambulation/gait training;Balance/vestibular training;Community reintegration;DME/adaptive equipment instruction;Discharge planning;Functional mobility training;Neuromuscular re-education;Patient/family education;Stair training;Psychosocial support;Therapeutic Activities;Therapeutic Exercise;UE/LE Strength taining/ROM;UE/LE Coordination activities;Wheelchair propulsion/positioning PT Transfers Anticipated Outcome(s): modified independent (modified independent\) PT Locomotion Anticipated Outcome(s):  (modified independent) PT  Recommendation Recommendations for Other Services: Therapeutic Recreation consult Therapeutic Recreation Interventions: Kitchen group;Stress management Follow Up Recommendations: Home health PT;Outpatient PT Patient destination: Home Equipment Recommended: Rolling walker with 5" wheels;Wheelchair (measurements);Wheelchair cushion (measurements)  Skilled Therapeutic Intervention  See other note from this time today.   PT Evaluation Precautions/Restrictions Precautions Precautions: Fall Precaution Comments:  (02 dependent) Restrictions Weight Bearing Restrictions: No Other Position/Activity Restrictions:  (pain with lying flat) General Chart Reviewed: Yes Response to Previous Treatment: Patient reporting fatigue but able to participate. Family/Caregiver Present: No Vital SignsOxygen Therapy SpO2: 100 % (100) O2 Device: Nasal Cannula O2 Flow Rate (L/min):  (2 L/min) Pain:  Pt denies pain when asked by therapist, however, reports pain when lying flat due to diaphragm issue   Home Living/Prior Functioning Home Living Available Help at Discharge: Available PRN/intermittently;Family Type of Home: House Home Access: Ramped entrance Entrance Stairs-Number of Steps:  (ramp in garage - has wheelchair bound son) Home Layout: Two level Alternate Level Stairs-Number of Steps:  (12) Alternate Level Stairs-Rails: Right  Lives With: Spouse Prior Function Level of Independence: Independent with basic ADLs;Independent with gait;Independent with transfers  Able to Take Stairs?: Yes Driving: Yes Vocation: Full time employment Vocation Requirements:  Electrical engineer work PTA) Vision/Perception  Vision - History Patient Visual Report: No change from baseline  Cognition Overall Cognitive Status: Within Functional Limits for tasks assessed Orientation Level: Oriented X4 Attention: Focused Memory: Appears intact Awareness: Appears intact Problem Solving: Appears intact Safety/Judgment: Appears  intact Sensation Sensation Light Touch: Appears Intact Coordination Gross Motor Movements are Fluid and Coordinated: Yes Fine Motor Movements are Fluid and Coordinated: Yes Motor  Motor Motor: Within Functional Limits  Trunk/Postural Assessment  Cervical Assessment Cervical Assessment: Within Functional Limits Thoracic Assessment Thoracic Assessment: Exceptions to [facility] (Incision on lateral trunk related recent surgery.  Inc kyphosis) Lumbar Assessment Lumbar Assessment: Exceptions to [facility] (Posterior Pelvic Tilt) Postural Control Postural Control: Within Functional Limits  Balance Balance Balance Assessed: Yes Static Sitting Balance Static Sitting - Balance Support: Feet supported;Bilateral upper extremity supported Dynamic Sitting Balance Dynamic Sitting - Balance Support: Feet supported;Bilateral upper extremity supported Sitting balance - Comments:  (Safe wtih sitting on edge of mat table with B UE/LE support.) Static Standing Balance Static Standing - Balance Support: Bilateral upper extremity supported Extremity Assessment  RUE Assessment RUE Assessment: Within Functional Limits (B UE AROM WFL;  Strength not formally tested but at least 3/5.) LUE Assessment LUE Assessment: Within Functional Limits (B UE AROM WFL; Strength not formally assessed but at least 3/5) RLE Assessment RLE Assessment: Exceptions to [facility] RLE Strength RLE Overall Strength Comments: 4/5 throughout b/l LE strength tested grossly. LLE Assessment LLE Assessment: Exceptions to [facility] LLE Strength LLE Overall Strength Comments: B LE strength 4/5 t/o grossly tested.   See Function Navigator for Current Functional Status.   Refer to Care Plan for Long Term Goals  Recommendations for other services: Therapeutic Recreation  Kitchen group and Stress management  Discharge Criteria: Patient will be discharged from PT if patient refuses treatment 3 consecutive times without medical reason, if treatment  goals not met, if there is a change in medical status, if patient makes no progress towards goals or if patient is discharged from hospital.  The above assessment, treatment plan, treatment alternatives and goals were discussed and mutually agreed upon: by patient  Cullin Dishman Hilario Quarry 06/25/2016, 12:26 PM

## 2016-06-25 NOTE — Progress Notes (Signed)
Physical Therapy Session Note  Patient Details  Name: [name] MRN: [id number] Date of Birth: 03/14/1960  Today's Date: 06/25/2016 PT Individual Time: 1455-1535 PT Individual Time Calculation (min): 40 min   Session 2 initiated while pt found up in chair falling asleep.  Pt rates pain as 5/10, however, reports that he does not want anything for pain.  Pt reports that he is tired but wants to work.  Pt rates fatigue as 7/10.  Pt Sp02:  100% t/o session on 2 L 02.  Additionally, HR remains in the 90's t/o session.  Pt propelled w/c with supervision x approx 100 ft prior to fatigue.  Pt stood for two short bouts in gym while playing connect four with therapist, however, became fatigued.  Therapist propelled pt back to room via wheelchair and pt transferred back to bed with min assist from therapist.  Pt was educated on positioning options while lying down to minimize pain.  Pt rates pain following as 4 to 5/10.  Pt left in bed with 3 rails up and bed alarm on.  Call bell in reach and wife in room.  Therapy Documentation Precautions:  Precautions Precautions: Fall Precaution Comments:  (02 dependent) Restrictions Weight Bearing Restrictions: No Other Position/Activity Restrictions:  (pain with lying flat)     See Function Navigator for Current Functional Status.   Therapy/Group: Individual Therapy  Alayah Knouff Hilario Quarry 06/25/2016, 3:48 PM

## 2016-06-25 NOTE — Progress Notes (Signed)
[name] is a 56 y.o. male 08-04-1960 [id number]  Subjective: No new complaints. No new problems. Slept well. Feeling OK.  Objective: Vital signs in last 24 hours: Temp:  [97.5 F (36.4 C)-98.5 F (36.9 C)] 97.7 F (36.5 C) (04/21 0458) Pulse Rate:  [89-96] 95 (04/21 0621) Resp:  [18-20] 18 (04/21 0458) BP: (96-124)/(43-82) 124/78 (04/21 0621) SpO2:  [98 %-100 %] 100 % (04/21 1212) Weight:  [100 kg (220 lb 7.4 oz)-100.2 kg (220 lb 14.4 oz)] 100.2 kg (220 lb 14.4 oz) (04/21 0458) Weight change:  Last BM Date: 06/24/16  Intake/Output from previous day: 04/20 0701 - 04/21 0700 In: -  Out: 225 [Urine:225]  Physical Exam General: No apparent distress   Working with PT at my visit Lungs: Normal effort. Lungs clear to auscultation, no crackles or wheezes. Cardiovascular: Regular rate and rhythm, no edema  Lab Results: BMET    Component Value Date/Time   NA 132 (L) 06/24/2016 0333   K 5.3 (H) 06/24/2016 0333   CL 99 (L) 06/24/2016 0333   CO2 23 06/24/2016 0333   GLUCOSE 109 (H) 06/24/2016 0333   BUN 61 (H) 06/24/2016 0333   CREATININE 1.85 (H) 06/24/2016 1650   CREATININE 1.71 (H) 03/10/2015 0859   CALCIUM 9.5 06/24/2016 0333   GFRNONAA 39 (L) 06/24/2016 1650   GFRAA 45 (L) 06/24/2016 1650   CBC    Component Value Date/Time   WBC 10.1 06/24/2016 1650   RBC 4.20 (L) 06/24/2016 1650   HGB 12.4 (L) 06/24/2016 1650   HCT 37.4 (L) 06/24/2016 1650   PLT 279 06/24/2016 1650   MCV 89.0 06/24/2016 1650   MCV 84.1 09/19/2014 1103   MCH 29.5 06/24/2016 1650   MCHC 33.2 06/24/2016 1650   RDW 14.2 06/24/2016 1650   LYMPHSABS 1.5 06/22/2016 0320   MONOABS 1.0 06/22/2016 0320   EOSABS 0.4 06/22/2016 0320   BASOSABS 0.0 06/22/2016 0320   CBG's (last 3):  No results for input(s): GLUCAP in the last 72 hours. LFT's Lab Results  Component Value Date   ALT 46 06/06/2016   AST 64 (H) 06/06/2016   ALKPHOS 90 06/06/2016   BILITOT 1.8 (H) 06/06/2016     Studies/Results: No results found.  Medications:  I have reviewed the patient's current medications. Scheduled Medications: . atorvastatin  40 mg Oral QHS  . carvedilol  3.125 mg Oral BID WC  . colchicine  0.6 mg Oral Daily  . feeding supplement (ENSURE ENLIVE)  237 mL Oral BID BM  . heparin  5,000 Units Subcutaneous Q8H  . hydrALAZINE  10 mg Oral Q8H  . isosorbide dinitrate  5 mg Oral BID  . pantoprazole  40 mg Oral Q1200  . tobramycin-dexamethasone   Right Eye BID   PRN Medications: ALPRAZolam, guaiFENesin, HYDROcodone-acetaminophen, ondansetron **OR** ondansetron (ZOFRAN) IV, sodium chloride, sorbitol  Assessment/Plan: Principal Problem:   Debilitated Active Problems:   Essential hypertension, benign   Chronic combined systolic and diastolic CHF (congestive heart failure) (HCC)   CKD (chronic kidney disease) stage 2, GFR 60-89 ml/min  1. Debilitation following hospitalization for CHF exac and sepsis - continue CIR as now ongoing and supportive med rx as rx'd. Abx course completed on IP unit 2. CKD stage 3 - stable 3. HTN - continue same and monitor BP with activity  4. CAD with ICD - asymptomatic - continue med mgmt 5. Gout hx - no current flares - continue daily suppression  Length of stay, days: 1  Kamerin Axford A. Asa Lente,  MD 06/25/2016, 12:47 PM

## 2016-06-25 NOTE — Progress Notes (Signed)
Physical Therapy Session Note  Patient Details  Name: [name] MRN: [id number] Date of Birth: [birth year]  Today's Date: 06/25/2016 PT Individual Time: 1105-1201 PT Individual Time Calculation (min): 56 min    Skilled Therapeutic Interventions/Progress Updates:  Session 1:  1105-1201 today focused on improving functional mobility and endurance in order to decrease burden of care for expected D/C home.  Session focused on education on safety with transfers use walker (one hand one walker, one on bed/chair).  Endurance addressed through pt self propelling wheelchair.  Pt performed sit to stand x 5 reps as well with increased fatigue.  Pt fatigues quickly and requires assist for O2 management at this time.  Following session, pt left in recliner with quick release belt, chair alarm, and call bell in reach.  Pt remained on 2 L02 via [place] t/o session and Sp02: 100%.  Therapy Documentation Precautions:  Precautions Precautions: Fall Precaution Comments:  (02 dependent) Restrictions Weight Bearing Restrictions: No Other Position/Activity Restrictions:  (pain with lying flat) General: Chart Reviewed: Yes Response to Previous Treatment: Patient reporting fatigue but able to participate. Family/Caregiver Present: No Vital Signs: Oxygen Therapy SpO2: 100 % (100) O2 Device: Nasal Cannula O2 Flow Rate (L/min):  (2 L/min)   See Function Navigator for Current Functional Status.   Therapy/Group: Individual Therapy  Levan Aloia Hilario Quarry 06/25/2016, 12:35 PM

## 2016-06-26 ENCOUNTER — Inpatient Hospital Stay (HOSPITAL_COMMUNITY): Payer: 59

## 2016-06-26 DIAGNOSIS — I5042 Chronic combined systolic (congestive) and diastolic (congestive) heart failure: Secondary | ICD-10-CM

## 2016-06-26 DIAGNOSIS — I10 Essential (primary) hypertension: Secondary | ICD-10-CM

## 2016-06-26 NOTE — Progress Notes (Signed)
Occupational Therapy Session Note  Patient Details  Name: TAVARI LOADHOLT MRN: [id number] Date of Birth: Jan 15, 1961  Today's Date: 06/26/2016 OT Individual Time: 1130-1158 OT Individual Time Calculation (min): 28 min    Short Term Goals: Week 1:  OT Short Term Goal 1 (Week 1): STG=LTG d/t ELOS  Skilled Therapeutic Interventions/Progress Updates:    1:1. Focus on standing balance/endurance while participating in grooming at sink. Pt stands ~15 min with prolonged seated rest breaks to shave at sink. Pt requires supervision and VC for posture while standing at sink. No drop on O2 sats while standing. After shaving, pt doffs/dons pull over shirt in sitting. Exited session with pt seated in standard chair in front of sink set up with lunch tray with call bell in reach. RN aware of positioning and pt without chair exit alarm.   Therapy Documentation Precautions:  Precautions Precautions: Fall Precaution Comments:  (02 dependent) Restrictions Weight Bearing Restrictions: No Other Position/Activity Restrictions:  (pain with lying flat)  Pain Assessment Pain Assessment: No/denies pain Other Treatments:    See Function Navigator for Current Functional Status.   Therapy/Group: Individual Therapy  Tonny Branch 06/26/2016, 12:10 PM

## 2016-06-26 NOTE — Progress Notes (Signed)
[name] is a 56 y.o. male 1960-06-25 [id number]  Subjective: Feels well - no concerns or complaints - breathing good, pain controlled - "tired, but I'm working on it"  Objective: Vital signs in last 24 hours: Temp:  [98 F (36.7 C)-98.5 F (36.9 C)] 98.5 F (36.9 C) (04/22 0437) Pulse Rate:  [88-89] 88 (04/22 0852) Resp:  [16-19] 16 (04/22 0437) BP: (101-112)/(67-72) 103/67 (04/22 0852) SpO2:  [100 %] 100 % (04/22 0437) Weight:  [103.4 kg (227 lb 15.3 oz)] 103.4 kg (227 lb 15.3 oz) (04/22 0437) Weight change: 3.4 kg (7 lb 7.9 oz) Last BM Date: 06/25/16  Intake/Output from previous day: 04/21 0701 - 04/22 0700 In: 720 [P.O.:720] Out: 775 [Urine:775]  Physical Exam General: No apparent distress    Lungs: Normal effort. Lungs clear to auscultation, no crackles or wheezes. Cardiovascular: Regular rate and rhythm, no edema  Lab Results: BMET    Component Value Date/Time   NA 132 (L) 06/24/2016 0333   K 5.3 (H) 06/24/2016 0333   CL 99 (L) 06/24/2016 0333   CO2 23 06/24/2016 0333   GLUCOSE 109 (H) 06/24/2016 0333   BUN 61 (H) 06/24/2016 0333   CREATININE 1.85 (H) 06/24/2016 1650   CREATININE 1.71 (H) 03/10/2015 0859   CALCIUM 9.5 06/24/2016 0333   GFRNONAA 39 (L) 06/24/2016 1650   GFRAA 45 (L) 06/24/2016 1650   CBC    Component Value Date/Time   WBC 10.1 06/24/2016 1650   RBC 4.20 (L) 06/24/2016 1650   HGB 12.4 (L) 06/24/2016 1650   HCT 37.4 (L) 06/24/2016 1650   PLT 279 06/24/2016 1650   MCV 89.0 06/24/2016 1650   MCV 84.1 09/19/2014 1103   MCH 29.5 06/24/2016 1650   MCHC 33.2 06/24/2016 1650   RDW 14.2 06/24/2016 1650   LYMPHSABS 1.5 06/22/2016 0320   MONOABS 1.0 06/22/2016 0320   EOSABS 0.4 06/22/2016 0320   BASOSABS 0.0 06/22/2016 0320   CBG's (last 3):  No results for input(s): GLUCAP in the last 72 hours. LFT's Lab Results  Component Value Date   ALT 46 06/06/2016   AST 64 (H) 06/06/2016   ALKPHOS 90 06/06/2016   BILITOT 1.8 (H)  06/06/2016    Studies/Results: No results found.  Medications:  I have reviewed the patient's current medications. Scheduled Medications: . atorvastatin  40 mg Oral QHS  . carvedilol  3.125 mg Oral BID WC  . colchicine  0.6 mg Oral Daily  . feeding supplement (ENSURE ENLIVE)  237 mL Oral BID BM  . heparin  5,000 Units Subcutaneous Q8H  . hydrALAZINE  10 mg Oral Q8H  . isosorbide dinitrate  5 mg Oral BID  . pantoprazole  40 mg Oral Q1200  . tobramycin-dexamethasone   Right Eye BID   PRN Medications: ALPRAZolam, guaiFENesin, HYDROcodone-acetaminophen, ondansetron **OR** ondansetron (ZOFRAN) IV, sodium chloride, sorbitol  Assessment/Plan: Principal Problem:   Debilitated Active Problems:   Essential hypertension, benign   Chronic combined systolic and diastolic CHF (congestive heart failure) (HCC)   CKD (chronic kidney disease) stage 2, GFR 60-89 ml/min  1. Debilitation following hospitalization for CHF exac and sepsis - continue CIR as now ongoing and supportive med rx as rx'd. Abx course completed on IP unit 2. CKD stage 3 - stable 3. HTN - continue same and monitor BP with activity  4. CAD with ICD - asymptomatic - continue med mgmt 5. Gout hx - no current flares - continue daily suppression  Length of stay, days: 2  Renaye Janicki A. Asa Lente, MD 06/26/2016, 10:46 AM

## 2016-06-26 NOTE — Progress Notes (Signed)
Held pt Coreg and hydralazin resulted in VS: 0852 103/67, 88; 1351 119/89. Staff will continue to monitor and meet needs.

## 2016-06-27 ENCOUNTER — Inpatient Hospital Stay (HOSPITAL_COMMUNITY): Payer: 59 | Admitting: Physical Therapy

## 2016-06-27 ENCOUNTER — Inpatient Hospital Stay (HOSPITAL_COMMUNITY): Payer: 59 | Admitting: Occupational Therapy

## 2016-06-27 DIAGNOSIS — D62 Acute posthemorrhagic anemia: Secondary | ICD-10-CM

## 2016-06-27 DIAGNOSIS — E875 Hyperkalemia: Secondary | ICD-10-CM

## 2016-06-27 DIAGNOSIS — Z9981 Dependence on supplemental oxygen: Secondary | ICD-10-CM

## 2016-06-27 DIAGNOSIS — I509 Heart failure, unspecified: Secondary | ICD-10-CM

## 2016-06-27 DIAGNOSIS — E871 Hypo-osmolality and hyponatremia: Secondary | ICD-10-CM

## 2016-06-27 DIAGNOSIS — N183 Chronic kidney disease, stage 3 (moderate): Secondary | ICD-10-CM

## 2016-06-27 LAB — COMPREHENSIVE METABOLIC PANEL
ALT: 177 U/L — AB (ref 17–63)
AST: 99 U/L — ABNORMAL HIGH (ref 15–41)
Albumin: 2.9 g/dL — ABNORMAL LOW (ref 3.5–5.0)
Alkaline Phosphatase: 106 U/L (ref 38–126)
Anion gap: 7 (ref 5–15)
BUN: 37 mg/dL — ABNORMAL HIGH (ref 6–20)
CO2: 25 mmol/L (ref 22–32)
CREATININE: 1.79 mg/dL — AB (ref 0.61–1.24)
Calcium: 9.3 mg/dL (ref 8.9–10.3)
Chloride: 101 mmol/L (ref 101–111)
GFR, EST AFRICAN AMERICAN: 47 mL/min — AB (ref >60)
GFR, EST NON AFRICAN AMERICAN: 40 mL/min — AB (ref >60)
Glucose, Bld: 95 mg/dL (ref 65–99)
Potassium: 5.2 mmol/L — ABNORMAL HIGH (ref 3.5–5.1)
Sodium: 133 mmol/L — ABNORMAL LOW (ref 135–145)
Total Bilirubin: 0.9 mg/dL (ref 0.3–1.2)
Total Protein: 6.8 g/dL (ref 6.5–8.1)

## 2016-06-27 LAB — CBC WITH DIFFERENTIAL/PLATELET
Basophils Absolute: 0 10*3/uL (ref 0.0–0.1)
Basophils Relative: 0 %
Eosinophils Absolute: 0.2 10*3/uL (ref 0.0–0.7)
Eosinophils Relative: 3 %
HCT: 37.3 % — ABNORMAL LOW (ref 39.0–52.0)
HEMOGLOBIN: 12.1 g/dL — AB (ref 13.0–17.0)
LYMPHS ABS: 1.4 10*3/uL (ref 0.7–4.0)
LYMPHS PCT: 17 %
MCH: 28.9 pg (ref 26.0–34.0)
MCHC: 32.4 g/dL (ref 30.0–36.0)
MCV: 89 fL (ref 78.0–100.0)
Monocytes Absolute: 1.1 10*3/uL — ABNORMAL HIGH (ref 0.1–1.0)
Monocytes Relative: 14 %
NEUTROS PCT: 66 %
Neutro Abs: 5.4 10*3/uL (ref 1.7–7.7)
Platelets: 258 10*3/uL (ref 150–400)
RBC: 4.19 MIL/uL — AB (ref 4.22–5.81)
RDW: 14.7 % (ref 11.5–15.5)
WBC: 8 10*3/uL (ref 4.0–10.5)

## 2016-06-27 MED ORDER — TORSEMIDE 20 MG PO TABS
10.0000 mg | ORAL_TABLET | Freq: Every day | ORAL | Status: DC
  Administered 2016-06-27 – 2016-06-29 (×3): 10 mg via ORAL
  Filled 2016-06-27 (×3): qty 1

## 2016-06-27 MED ORDER — LORATADINE 10 MG PO TABS
10.0000 mg | ORAL_TABLET | Freq: Every day | ORAL | Status: DC
  Administered 2016-06-27 – 2016-07-01 (×5): 10 mg via ORAL
  Filled 2016-06-27 (×5): qty 1

## 2016-06-27 NOTE — IPOC Note (Addendum)
Overall Plan of Care [facility]) Patient Details Name: [name] MRN: [id number] DOB: [birth year]  Admitting Diagnosis: debility  Hospital Problems: Principal Problem:   Debilitated Active Problems:   Essential hypertension, benign   Chronic combined systolic and diastolic CHF (congestive heart failure) (HCC)   CKD (chronic kidney disease) stage 2, GFR 60-89 ml/min   Stage 3 chronic kidney disease   Hyperkalemia   Hyponatremia     Functional Problem List: Nursing Pain, Skin Integrity, Endurance, Medication Management, Edema, Safety, Bladder  PT Balance, Endurance, Pain, Safety, Motor  OT Balance, Endurance, Safety  SLP    TR         Basic ADL's: OT Grooming, Bathing, Dressing, Toileting     Advanced  ADL's: OT       Transfers: PT Bed Mobility, Bed to Chair, Car, Manufacturing systems engineer, Metallurgist: PT Ambulation, Emergency planning/management officer, Stairs     Additional Impairments: OT    SLP        TR      Anticipated Outcomes Item Anticipated Outcome  Self Feeding MOD I  Swallowing      Basic self-care  MOD I  Toileting  MOD I   Bathroom Transfers MOD I  Bowel/Bladder  Bladder urgency resolved; continent with timed toileting. No s/s infection.  Transfers  modified independent (modified independent\)  Locomotion   (modified independent)  Communication     Cognition     Pain  Managed at goal 2/10  Safety/Judgment  Increased safety awareness; no falls, injury this admission.   Therapy Plan: PT Intensity: Minimum of 1-2 x/day ,45 to 90 minutes PT Frequency: 5 out of 7 days PT Duration Estimated Length of Stay:  (5 to 7 days) OT Intensity: Minimum of 1-2 x/day, 45 to 90 minutes OT Frequency: 5 out of 7 days OT Duration/Estimated Length of Stay: 5-7         Team Interventions: Nursing Interventions Patient/Family Education, Bladder Management, Pain Management, Disease Management/Prevention, Medication Management, Psychosocial Support, Skin  Care/Wound Management, Discharge Planning  PT interventions Ambulation/gait training, Training and development officer, Community reintegration, Engineer, drilling, Discharge planning, Functional mobility training, Neuromuscular re-education, Patient/family education, Stair training, Psychosocial support, Therapeutic Activities, Therapeutic Exercise, UE/LE Strength taining/ROM, UE/LE Coordination activities, Wheelchair propulsion/positioning  OT Interventions Training and development officer, Academic librarian, Discharge planning, Disease mangement/prevention, Engineer, drilling, Functional mobility training, Pain management, Patient/family education, Self Care/advanced ADL retraining, Therapeutic Activities, Therapeutic Exercise, UE/LE Strength taining/ROM, UE/LE Coordination activities, Visual/perceptual remediation/compensation  SLP Interventions    TR Interventions    SW/CM Interventions  Psychosocial Assessment, Pt/Family Education & Discharge Planning    Team Discharge Planning: Destination: PT-Home ,OT- Home , SLP-  Projected Follow-up: PT-Home health PT, Outpatient PT, OT-  Home health OT, SLP-  Projected Equipment Needs: PT-Rolling walker with 5" wheels, Wheelchair (measurements), Wheelchair cushion (measurements), OT- To be determined, SLP-  Equipment Details: PT- , OT-has built in shower bench- to be explored if it is usable Patient/family involved in discharge planning: PT- Patient,  OT-Patient, SLP-   MD ELOS: 5-7 days. Medical Rehab Prognosis:  Excellent Assessment:  56 y.o.right handed malewith history of diastolic congestive heart failure. Hemidiaphragm status post para ganglioglioma resection, CKD stage III, CAD-ICD, right corneal eye surgery. Patient lives with spouse as well as wheelchair bound teenager who is independent from wheelchair level. Reported to be independent prior to admissionand still working as well as his wife works. He presented  06/06/2016 with increasing shortness of breath and diaphoresis. Oxygen  saturations 83%. Troponin negative. Noted creatinine of 2.40 from baseline 1.71. Hyperkalemia 6.4. Chest x-ray negative for acute process. Initially treated for CHF exacerbation with BiPAP, IV Lasix. Patient later required intubation for respiratory distress. Blood cultures grew strep viridans in one of 2 bottles. Maintain on broad-spectrum antibiotics transitioned to Rocephin and antibiotic therapies have been completed.. Echocardiogram with ejection fraction of 20% diffuse hypokinesis no PFO. TEE moderate to severe mitral regurgitation no vegetation. Renal ultrasound negative for hydronephrosis with follow-up per renal services and maintain on gentle IV fluids withcreatinine improved 1.81. Patient remained intubated until 06/19/2016. Pt with resulting functional deficits of generalized weakness, poor activity tolerance.  Will set goals for Mod I with PT/OT.    See Team Conference Notes for weekly updates to the plan of care

## 2016-06-27 NOTE — Progress Notes (Signed)
Physical Therapy Session Note  Patient Details  Name: [name] MRN: [id number] Date of Birth: [birth year]  Today's Date: 06/27/2016 PT Individual Time: 1000-1030 PT Individual Time Calculation (min): 30 min    Therapy Documentation Precautions:  Precautions Precautions: Fall Precaution Comments:  (02 dependent) Restrictions Weight Bearing Restrictions: No Other Position/Activity Restrictions:  (pain with lying flat)    Vital Signs: Therapy Vitals Pulse Rate: (!) 108 Oxygen Therapy SpO2: 96 % O2 Device: Not Delivered Pain: Pain Assessment Pain Assessment: No/denies pain Pain Score: 0-No pain   Sit to and from stand transfer with use of RW close supervision. Verbal cues for proper sequence and technique and RW management.  Patient ambulated 80 feet and 120 feet with RW close supervision. Patient ambulated with a step through gait pattern. Verbal cues for pacing, upright posture and controlled breathing. Patient limited by fatigue.  Patient up and down 8 steps with right handrail close supervision and one episode of min assist, secondary to poor foot placement . Patient educated on proper sequence and technique.   Patient remained off supplemental O2 throughout session. Patient O2 saturation ranged from 92% to 97% on room air responding appropriately with activity. Patient required rest breaks throughout session. Patient educated on pursed lip breathing. Patient returned to room at end of session with all needs met seated in recliner chair with chair alarm engaged and quick release belt engaged. Patient tolerated session well and was without complaints.   See Function Navigator for Current Functional Status.   Therapy/Group: Individual Therapy  Retta Diones 06/27/2016, 10:36 AM

## 2016-06-27 NOTE — Progress Notes (Signed)
Occupational Therapy Session Note  Patient Details  Name: [name] MRN: [id number] Date of Birth: 03-Mar-1961  Today's Date: 06/27/2016 OT Individual Time: 0901-1000 OT Individual Time Calculation (min): 59 min    Short Term Goals: Week 1:  OT Short Term Goal 1 (Week 1): STG=LTG d/t ELOS  Skilled Therapeutic Interventions/Progress Updates:    Pt seen for ADL session during therapy.  Min assist for transfer to the shower bench with use of the RW.  Oxygen doffed throughout session with sats maintained at 95% or better on room air.  Increased fatigue noted during bathing and dressing with increased time needed and multiple rest breaks secondary to dyspnea 2/4.  Pt needed min instructional cueing to sequence through bathing.  He transferred to the bedside chair for dressing tasks but only completed donning shirt, pants, and underpants within session.  Pt left with PT for next session and to work on completion of socks and shoes.    Therapy Documentation Precautions:  Precautions Precautions: Fall Precaution Comments:  (02 dependent) Restrictions Weight Bearing Restrictions: No Other Position/Activity Restrictions:  (pain with lying flat)  Vital Signs: Therapy Vitals Pulse Rate: (!) 108 Oxygen Therapy SpO2: 96 % O2 Device: Not Delivered Pain: Pain Assessment Pain Assessment: No/denies pain Pain Score: 0-No pain ADL: See Function Navigator for Current Functional Status.   Therapy/Group: Individual Therapy  Shakaya Bhullar OTR/L 06/27/2016, 12:18 PM

## 2016-06-27 NOTE — Progress Notes (Signed)
RN and NT went to room due to sounds of patient vomiting.  Patient had vomited up clear phlegm.  Patient states he has had similar episodes before and it was due to his mucous draining down his throat.  Notified Marlowe Shores, PA of findings, new orders received for claritin for allergies.  Brita Romp, RN

## 2016-06-27 NOTE — Care Management Note (Signed)
Inpatient [facility] Individual Statement of Services  Patient Name:  [name]  Date:  06/27/2016  Welcome to the [facility].  Our goal is to provide you with an individualized program based on your diagnosis and situation, designed to meet your specific needs.  With this comprehensive rehabilitation program, you will be expected to participate in at least 3 hours of rehabilitation therapies Monday-Friday, with modified therapy programming on the weekends.  Your rehabilitation program will include the following services:  Physical Therapy (PT), Occupational Therapy (OT), 24 hour per day rehabilitation nursing, Therapeutic Recreaction (TR), Case Management (Social Worker), Rehabilitation Medicine, Nutrition Services and Pharmacy Services  Weekly team conferences will be held on Wednesday to discuss your progress.  Your Social Worker will talk with you frequently to get your input and to update you on team discussions.  Team conferences with you and your family in attendance may also be held.  Expected length of stay: 5-7 days  Overall anticipated outcome: mod/I level  Depending on your progress and recovery, your program may change. Your Social Worker will coordinate services and will keep you informed of any changes. Your Social Worker's name and contact numbers are listed  below.  The following services may also be recommended but are not provided by the [facility] will be made to provide these services after discharge if needed.  Arrangements include referral to agencies that provide these services.  Your insurance has been verified to be:  [facility] Your primary doctor is:  Lennette Bihari [place]  Pertinent information will be shared with your doctor and your insurance company.  Social Worker:  Ovidio Kin, [place] or ([phone]  Information discussed with and copy given to patient by: [name], 06/27/2016, 9:41 AM

## 2016-06-27 NOTE — Progress Notes (Signed)
Social Work Assessment and Plan Social Work Assessment and Plan  Patient Details  Name: [name] MRN: [id number] Date of Birth: December 29, 1960  Today's Date: 06/27/2016  Problem List:  Patient Active Problem List   Diagnosis Date Noted  . Stage 3 chronic kidney disease   . Hyperkalemia   . Hyponatremia   . Supplemental oxygen dependent   . Debilitated 06/24/2016  . Acute on chronic combined systolic and diastolic CHF (congestive heart failure) ([place])   . Coronary artery disease involving native coronary artery of native heart without angina pectoris   . Community acquired pneumonia   . Tachypnea   . Acute blood loss anemia   . Bacteremia due to Streptococcus 06/14/2016  . Central line infection   . Infected defibrillator ([place])   . History of ETT   . Acute on chronic respiratory failure with hypoxemia ([place])   . Pressure injury of skin 06/08/2016  . Acute respiratory failure with hypoxia ([place]) 06/06/2016  . CKD (chronic kidney disease) stage 3, GFR 30-59 ml/min 02/01/2015  . 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 CHF exacerbation ([place]) 01/31/2015  . Acute on chronic heart failure ([place]) 01/31/2015  . AKI (acute kidney injury) ([place]) 01/31/2015  . Gout flare 01/31/2015  . OSA on CPAP 01/31/2015  . Leukocytosis 01/31/2015  . 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 History of cardiac arrhythmia 09/19/2014  . CKD (chronic kidney disease) stage 2, GFR 60-89 ml/min 09/19/2014  . Other fatigue 09/19/2014  . Hip pain 09/19/2014  . History of gout 09/19/2014  . History of bladder carcinoma 09/19/2014  . 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 Chest discomfort 09/10/2013  . Bladder cancer ([place]) 11/09/2012  . Preoperative cardiovascular examination 09/11/2012  . Malignant tumor of urinary bladder ([place]) 07/13/2012  . Chronic combined systolic and diastolic CHF (congestive heart failure) ([place]) 06/28/2012  . Gout 05/17/2012  . Inappropriate shocks from ICD -secondary T wave oversensing in  the context of hyperkalemia 04/12/2012  . Automatic implantable cardioverter-defibrillator in situ 05/17/2011  . 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 ORTHOSTATIC DIZZINESS 11/03/2009  . VENTRICULAR TACHYCARDIA 10/20/2008  . SYSTOLIC HEART FAILURE, CHRONIC 10/20/2008  . SOB (shortness of breath) 03/25/2008  . Obstructive sleep apnea 03/12/2007  . Essential hypertension, benign 03/12/2007  . CARDIOMYOPATHY 03/12/2007   Past Medical History:  Past Medical History:  Diagnosis Date  . Arthritis    GOUT  . Automatic implantable cardioverter-defibrillator in situ   . Biventricular ICD (implantable cardiac defibrillator) [employer]onic ]    DOI 2008/ upgrade 2010/ Gen Change 2014  . Bladder tumor    PT HOSP AT [facility] 4/24 TO 4/26 2014 WITH UTI--AND FOUND TO HAVE BLADDER TUMOR-  . Cardiomyopathy    Idiopathic dilated;   . CHF (congestive heart failure) ([place])   . CKD (chronic kidney disease)    stage III baseline Crt 1.8-2.0  . Coronary artery disease   . Gout 08/03/12   PT C/O OF RIGHT KNEE PAIN AND SWELLING -STATES GOUT FLARE UP - ONGOING SINCE APRIL - BUT SWELLING W/IN LAST WEEK  . Hilar mass    Noted CT 2010  . HTN (hypertension)    Dr. Caryl Comes cardiologist  . Hypothyroidism   . 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 Sleep apnea    CPAP  . Systolic CHF ([place])    EF 22%  . Ventricular tachycardia [facility])    s/p ICD   Past Surgical History:  Past Surgical History:  Procedure Laterality Date  . BIV ICD GENERTAOR CHANGE OUT     DOI 2008/ upgrade 2010/ Gen Change 2014   . CARDIAC CATHETERIZATION     he was found to have normal coronary arteries but with a globally dilated and hypocontractile heart  . CARDIAC DEFIBRILLATOR PLACEMENT     DOI 2008/ upgrade 2010/ Gen Change 2014   . CHEST TUBE INSERTION  09/10/2013  . CHOLECYSTECTOMY    . cornea replacement    . ENDOBRONCHIAL ULTRASOUND Bilateral 10/01/2012   Procedure:  ENDOBRONCHIAL ULTRASOUND;  Surgeon: Collene Gobble, MD;  Location: [facility] ENDOSCOPY;  Service: Cardiopulmonary;  Laterality: Bilateral;  . IMPLANTABLE CARDIOVERTER DEFIBRILLATOR (ICD) GENERATOR CHANGE N/A 05/02/2012   Procedure: ICD GENERATOR CHANGE;  Surgeon: Deboraha Sprang, MD;  Location: [facility] CATH LAB;  Service: Cardiovascular;  Laterality: N/A;  . MEDIASTINOSCOPY N/A 10/25/2012   Procedure: MEDIASTINOSCOPY;  Surgeon: [name], MD;  Location: [place];  Service: Thoracic;  Laterality: N/A;  . MEDIASTINOTOMY  09/10/2013   paratracheal mass    DR Darcey Nora  . MEDIASTINOTOMY CHAMBERLAIN MCNEIL Right 09/10/2013   Procedure: MEDIASTINOTOMY CHAMBERLAIN MCNEIL;  Surgeon: [name], MD;  Location: [place];  Service: Thoracic;  Laterality: Right;  . RESECTION OF MEDIASTINAL MASS Right 02/13/2014   Procedure: RESECTION OF MEDIASTINAL MASS;  Surgeon: [name], MD;  Location: [facility] OR;  Service: Thoracic;  Laterality: Right;  PATIENT NEEDS EPIDURAL CATHETER AND A SWAN GANZ CATHETER (DR. CHRIS MOSER AWARE PER PVT)  . RIGHT HEART CATHETERIZATION N/A 01/27/2012   Procedure: RIGHT HEART CATH;  Surgeon: Jolaine Artist, MD;  Location: [facility] CATH LAB;  Service: Cardiovascular;  Laterality: N/A;  . TONSILLECTOMY    . TRANSURETHRAL RESECTION OF BLADDER TUMOR N/A 08/13/2012   Procedure: TRANSURETHRAL RESECTION OF BLADDER TUMOR (TURBT);  Surgeon: Claybon Jabs, MD;  Location: [facility] ORS;  Service: Urology;  Laterality: N/A;  MITOMYCIN C  . US ECHOCARDIOGRAPHY  03-17-2008, 07-03-2006   EF 15-20%, EF 15-20%  . VIDEO ASSISTED THORACOSCOPY (VATS)/THOROCOTOMY Right 02/13/2014   Procedure: VIDEO ASSISTED THORACOSCOPY (VATS)/THOROCOTOMY;  Surgeon: [name], MD;  Location: [facility] OR;  Service: Thoracic;  Laterality: Right;  PATIENT NEEDS EPIDURAL CATHETER (DR. CHRIS MOSER AWARE PER PVT)  . VIDEO BRONCHOSCOPY N/A 10/25/2012   Procedure: VIDEO BRONCHOSCOPY;  Surgeon: [name], MD;  Location: [facility] OR;  Service:  Thoracic;  Laterality: N/A;   Social History:  reports that he has never smoked. He has never used smokeless tobacco. He reports that he does not drink alcohol or use drugs.  Family / Support Systems Marital Status: Married Patient Roles: Spouse, Parent, Other (Comment) (employee) Spouse/Significant Other: [phone]-cell [phone] Children: two son's one of whom lives with them Other Supports: friends and neighbors Anticipated Caregiver: Wife and son's Ability/Limitations of Caregiver: Wife works part Geophysical data processor at [employer], son is wheelchair bound Caregiver Availability: 24/7 Family Dynamics: Close knit family who will assist one another. He is grateful to have them and their help if he requires it. He is hoping he will be independent when he leaves here.  Social History Preferred language: English Religion: Christian Cultural Background: No issues Education: High School Read: Yes Write: Yes Employment Status: Employed Name of Employer: [employer] Return to Work Plans: Unsure dependent upon his recovery from this Freight forwarder Issues: No issues Guardian/Conservator: none-according to MD pt is capable of mkaing his own decisions while here.   Abuse/Neglect Physical Abuse: Denies Verbal Abuse: Denies  Sexual Abuse: Denies Exploitation of patient/patient's resources: Denies Self-Neglect: Denies  Emotional Status Pt's affect, behavior adn adjustment status: Pt is motivated to do well and recover from his health issues. He voiced this has really taken a lot out of him and how weak he is. He is hoping while here he will get stronger and recover from this but manage. He is wondering if he should apply for disability, encouraged him to ask his MD. Recent Psychosocial Issues: other health issues Pyschiatric History: No history deferred depression due to feeling sick and worker getting his RN. He is exhausted from therapies and needs to rest. Will aks  team if would benefit from seeing neuro-psych while here. Will continue to monitor while here. Substance Abuse History: No issues  Patient / Family Perceptions, Expectations & Goals Pt/Family understanding of illness & functional limitations: Pt and wife can explain his condition and heart and kidney issues. He is hoping he will continue to progress and be independent again. He doesn't like bieng in the hospital and wants to ge thome ASAP. Premorbid pt/family roles/activities: Husband, father, employee, freind, co-worker, etc Anticipated changes in roles/activities/participation: resume Pt/family expectations/goals: Pt states: " I want to be able to do for myself before I leave here." Wife states: " I hope he does well here, but we will help him."  [employer]: None Premorbid Home Care/DME Agencies: None Transportation available at discharge: family Resource referrals recommended: Neuropsychology, Support group (specify)  Discharge Planning Living Arrangements: Spouse/significant other, Children Support Systems: Spouse/significant other, Children, Other relatives, Friends/neighbors, Social worker community Type of Residence: Private residence Insurance Resources: Multimedia programmer (specify) Sports administrator) Financial Resources: Employment, Secondary school teacher Screen Referred: No Living Expenses: Higher education careers adviser Management: Patient, Spouse Does the patient have any problems obtaining your medications?: No Home Management: Wife and son's Patient/Family Preliminary Plans: Return home with family who can provide 24 hr care. They hope he will not require that at discharge from rehab. Pt is doing well and is high level so should be supervision at most. Will work on discharge needs and get FMLA paperwork completed. Social Work Anticipated Follow Up Needs: HH/OP, Support Group  Clinical Impression Pleasant gentleman who is motivated and may have pushed himself too hard in am  therapies reason whey he is ill now. His family is involved and supportive and will assist him at discharge from rehab.  Will work on discharge needs, pt is doing well and will probably a short length of stay here. He is asking to apply for SSD, informed him to ask MD regarding prognosis back to work. Will get FMLA papers completed for him.  [name] 06/27/2016, 2:16 PM

## 2016-06-27 NOTE — Progress Notes (Addendum)
[place] PHYSICAL MEDICINE & REHABILITATION     PROGRESS NOTE  Subjective/Complaints:  Pt seen sitting up in bed this AM.  He slept well overnight and had a good weekend.  He believes he is doing well with therapies.   ROS: Denies CP, SOB, N/V/D.  Objective: Vital Signs: Blood pressure 117/81, pulse 95, temperature 97.9 F (36.6 C), temperature source Oral, resp. rate 16, height 5\' 10"  (1.778 m), weight 102.2 kg (225 lb 5 oz), SpO2 97 %. No results found.  Recent Labs  06/24/16 1650 06/27/16 0515  WBC 10.1 8.0  HGB 12.4* 12.1*  HCT 37.4* 37.3*  PLT 279 258    Recent Labs  06/24/16 1650 06/27/16 0515  NA  --  133*  K  --  5.2*  CL  --  101  GLUCOSE  --  95  BUN  --  37*  CREATININE 1.85* 1.79*  CALCIUM  --  9.3   CBG (last 3)  No results for input(s): GLUCAP in the last 72 hours.  Wt Readings from Last 3 Encounters:  06/27/16 102.2 kg (225 lb 5 oz)  06/24/16 97.7 kg (215 lb 6.4 oz)  05/05/16 109.8 kg (242 lb)    Physical Exam:  BP 117/81 (BP Location: Left Arm)   Pulse 95   Temp 97.9 F (36.6 C) (Oral)   Resp 16   Ht 5\' 10"  (1.778 m)   Wt 102.2 kg (225 lb 5 oz)   SpO2 97%   BMI 32.33 kg/m  Constitutional: NAD.  Well-developed. obese HENT: Normocephalic. Atraumatic.  Eyes: Right eye decreased vision, opaque. Ambylopia.  Cardiovascular: Normal rateand regular rhythm. No JVD.  Respiratory: Effort normaland breath sounds normal. +[place] GI: Soft. Bowel sounds are normal.  Musc: 1+ edema bilateral LE Neurological: He is alertand oriented.  Follow full commands Motor: 4+/5 throughout Skin warm and dry. A few ecchmoses Psych: pleasant, a bit flat. cooperative   Assessment/Plan: 1. Functional deficits secondary to debility which require 3+ hours per day of interdisciplinary therapy in a comprehensive inpatient rehab setting. Physiatrist is providing close team supervision and 24 hour management of active medical problems listed below. Physiatrist  and rehab team continue to assess barriers to discharge/monitor patient progress toward functional and medical goals.  Function:  Bathing Bathing position   Position: Wheelchair/chair at sink  Bathing parts Body parts bathed by patient: Right arm, Left arm, Left lower leg, Chest, Abdomen, Front perineal area, Buttocks, Right upper leg, Left upper leg Body parts bathed by helper: Back  Bathing assist Assist Level: Touching or steadying assistance(Pt > 75%)      Upper Body Dressing/Undressing Upper body dressing   What is the patient wearing?: Pull over shirt/dress     Pull over shirt/dress - Perfomed by patient: Thread/unthread right sleeve, Thread/unthread left sleeve, Put head through opening          Upper body assist Assist Level: Supervision or verbal cues      Lower Body Dressing/Undressing Lower body dressing   What is the patient wearing?: Pants, Underwear, Socks, Shoes Underwear - Performed by patient: Thread/unthread right underwear leg, Thread/unthread left underwear leg, Pull underwear up/down   Pants- Performed by patient: Thread/unthread right pants leg, Thread/unthread left pants leg, Pull pants up/down       Socks - Performed by patient: Don/doff right sock, Don/doff left sock   Shoes - Performed by patient: Don/doff right shoe, Don/doff left shoe, Fasten left, Fasten right  Lower body assist Assist for lower body dressing: Touching or steadying assistance (Pt > 75%)      Toileting Toileting   Toileting steps completed by patient: Adjust clothing prior to toileting, Performs perineal hygiene, Adjust clothing after toileting   Toileting Assistive Devices: Grab bar or rail  Toileting assist Assist level: Supervision or verbal cues   Transfers Chair/bed transfer     Chair/bed transfer assist level: Supervision or verbal cues Chair/bed transfer assistive device: Armrests     Locomotion Ambulation     Max distance:  (50 ft) Assist  level: Touching or steadying assistance (Pt > 75%)   Wheelchair   Type: Manual Max wheelchair distance:  (75 ft) Assist Level: Supervision or verbal cues  Cognition Comprehension Comprehension assist level: Follows complex conversation/direction with extra time/assistive device  Expression Expression assist level: Expresses complex ideas: With extra time/assistive device  Social Interaction Social Interaction assist level: Interacts appropriately with others - No medications needed.  Problem Solving Problem solving assist level: Solves basic 90% of the time/requires cueing < 10% of the time  Memory Memory assist level: Recognizes or recalls 90% of the time/requires cueing < 10% of the time    Medical Problem List and Plan: 1. Debilitationsecondary to CHF exacerbation with sepsis  Cont CIR 2. DVT Prophylaxis/Anticoagulation: Subcutaneous heparin. Monitor for any bleeding episodes. Patient is ambulatory 3. Pain Management: Hydrocodone as needed 4. Mood: Xanax 0.25-0.5 mg 3 times a day as needed 5. Neuropsych: This patient iscapable of making decisions on hisown behalf. 6. Skin/Wound Care: Routine skin checks 7. Fluids/Electrolytes/Nutrition: Routine I&Os 8.Hypertension. Coreg 3.125 mg twice a day, hydralazine 10 mg 3 times a day, Isordil 5 mg twice a day  Controlled 4/23 9.CKD stage III.   Cr 1.79 on 4/23  Cont to monitor 10.CAD-ICD. No chest pain or shortness of breath 11.ID.Streptococcus viridans bacteremia. Completed anti-microbial antibiotic therapy. 12.Hyperlipidemia. Lipitor 13.History of gout. Colchicine 0.6 mg daily. Monitor for any flareup 14. Hyponatremia  Na 133 on 4/23  Cont to monitor 15. Hyperkalemia  K 5.2 on 4/23  Cont to monitor 16. 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

## 2016-06-27 NOTE — Progress Notes (Signed)
Advanced Heart Failure Rounding Note  Primary Cardiologist: Dr. Haroldine Laws   Subjective:    Admitted 06/06/16 with acute respiratory failure requiring intubation secondary to volume overload and septic shock. 1/2 blood cultures positive for strep viridans.   Extubated 06/18/16.   Doing well in CIR. Respiratory status stable. Doing his PT with little difficulty.  Denies SOB or CP. No complaints this am.   There is a 10 lb difference from his weight from floor to CIR scales. Also up 5 lbs since moving to CIR.   Objective:   Weight Range: 225 lb 5 oz (102.2 kg) Body mass index is 32.33 kg/m.   Vital Signs:   Temp:  [97.9 F (36.6 C)-98.6 F (37 C)] 97.9 F (36.6 C) (04/23 0429) Pulse Rate:  [92-95] 95 (04/23 0429) Resp:  [16-20] 16 (04/23 0429) BP: (107-119)/(72-89) 117/81 (04/23 0429) SpO2:  [97 %-100 %] 97 % (04/23 0429) Weight:  [225 lb 5 oz (102.2 kg)] 225 lb 5 oz (102.2 kg) (04/23 0429) Last BM Date: 06/25/16  Weight change: Filed Weights   06/25/16 0458 06/26/16 0437 06/27/16 0429  Weight: 220 lb 14.4 oz (100.2 kg) 227 lb 15.3 oz (103.4 kg) 225 lb 5 oz (102.2 kg)    Intake/Output:   Intake/Output Summary (Last 24 hours) at 06/27/16 1027 Last data filed at 06/27/16 0745  Gross per 24 hour  Intake              600 ml  Output              300 ml  Net              300 ml     Physical Exam: General: Well appearing. No resp difficulty. HEENT: Normal except for mild R exophthalmos.  Neck: Supple. No JVP. Carotids 2+ bilat; no bruits. No thyromegaly or nodule noted. Cor: PMI nondisplaced. RRR, No M/G/R noted Lungs: CTAB, normal effort. Abdomen: soft, non-tender, distended, no HSM. No bruits or masses. +BS  Extremities: no cyanosis, clubbing, rash, R and LLE no edema.  Neuro: alert & orientedx3, cranial nerves grossly intact. moves all 4 extremities w/o difficulty. Affect pleasant   Labs: CBC  Recent Labs  06/24/16 1650 06/27/16 0515  WBC 10.1 8.0    NEUTROABS  --  5.4  HGB 12.4* 12.1*  HCT 37.4* 37.3*  MCV 89.0 89.0  PLT 279 326   Basic Metabolic Panel  Recent Labs  06/24/16 1650 06/27/16 0515  NA  --  133*  K  --  5.2*  CL  --  101  CO2  --  25  GLUCOSE  --  95  BUN  --  37*  CREATININE 1.85* 1.79*  CALCIUM  --  9.3   Liver Function Tests  Recent Labs  06/27/16 0515  AST 99*  ALT 177*  ALKPHOS 106  BILITOT 0.9  PROT 6.8  ALBUMIN 2.9*   No results for input(s): LIPASE, AMYLASE in the last 72 hours. Cardiac Enzymes No results for input(s): CKTOTAL, CKMB, CKMBINDEX, TROPONINI in the last 72 hours.  BNP: BNP (last 3 results)  Recent Labs  06/06/16 1645 06/06/16 1925  BNP 796.0* 553.0*    ProBNP (last 3 results) No results for input(s): PROBNP in the last 8760 hours.   D-Dimer No results for input(s): DDIMER in the last 72 hours. Hemoglobin A1C No results for input(s): HGBA1C in the last 72 hours. Fasting Lipid Panel No results for input(s): CHOL, HDL, LDLCALC, TRIG, CHOLHDL,  LDLDIRECT in the last 72 hours. Thyroid Function Tests No results for input(s): TSH, T4TOTAL, T3FREE, THYROIDAB in the last 72 hours.  Invalid input(s): FREET3  Other results:     Imaging/Studies:   No results found.    Medications:     Scheduled Medications: . atorvastatin  40 mg Oral QHS  . carvedilol  3.125 mg Oral BID WC  . colchicine  0.6 mg Oral Daily  . feeding supplement (ENSURE ENLIVE)  237 mL Oral BID BM  . heparin  5,000 Units Subcutaneous Q8H  . hydrALAZINE  10 mg Oral Q8H  . isosorbide dinitrate  5 mg Oral BID  . pantoprazole  40 mg Oral Q1200  . tobramycin-dexamethasone   Right Eye BID     Infusions:   PRN Medications:  ALPRAZolam, guaiFENesin, HYDROcodone-acetaminophen, ondansetron **OR** ondansetron (ZOFRAN) IV, sodium chloride, sorbitol   Assessment/Plan   1. Acute hypoxic respiratory failure:  - Much improved. No longer requiring 02.  - Continue IS.   - May eventually be  candidate for diaphragm plication, but would need to continue to improve.  2. AKI on CKD III  - Creatinine stable in his baseline range of 1.7 - 1.8 3. Chronic systolic CHF: EF 66% on 04/15/45 - Volume status stable on exam.  - Continue coreg 6.25 mg BID - Continue hydralazine 10 mg TID.  - Continue isordil 5 mg BID.  - Restart torsemide 10 mg daily.  - No ACE/ARB/ARNI with CKD and Hyper K.  4. Paralzyed right hemidiaphragm.  - Doing well now with extubation and weaned off 02.  - Has asked about diaphragm plication, but would needs to continue to improve before this can be considered.  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 Otherwise stable.  6. Septic Shock due to CAP - Initial cultures + 1 of 2 strep viridans. Compelted ABX course. Follow up Cultures negative.  - TEE negative for endocarditis or infected lead.  7. Deconditioning - Making progress in CIR. Doing well.   Length of Stay: 3  Annamaria Helling  06/27/2016, 10:27 AM  Advanced Heart Failure Team Pager [phone] (M-F; 7a - 4p)  Please contact [facility] for night-coverage after hours (4p -7a ) and weekends on [email or web address]   Patient seen and examined with the above-signed Advanced Practice Provider and/or Housestaff. I personally reviewed laboratory data, imaging studies and relevant notes. I independently examined the patient and formulated the important aspects of the plan. I have edited the note to reflect any of my changes or salient points. I have personally discussed the plan with the patient and/or family.  Doing well. Getting stronger. Weight trending up. Will restart torsemide. Otherwise continue current regimen.   Glori Bickers, MD  5:14 PM

## 2016-06-27 NOTE — Progress Notes (Signed)
Occupational Therapy Session Note  Patient Details  Name: [name] MRN: [id number] Date of Birth: [birth year]  Today's Date: 06/27/2016 OT Individual Time: 1450-1531 OT Individual Time Calculation (min): 41 min    Short Term Goals: Week 1:  OT Short Term Goal 1 (Week 1): STG=LTG d/t ELOS  Skilled Therapeutic Interventions/Progress Updates:    [name] ambulated down to the ortho gym with supervision using the RW for support.  Increased trunk and cervical flexion noted with ambulation, with pt reporting pain in the right side from procedure he underwent approximately 2 years ago.  Once in the gym worked on balance in standing.  Incorporated one set of shoulder horizontal abduction exercises with min assist in standing, using medium resistance green theraband.  Also had pt work on sit to stand without use of UEs with min assist and transitional stepping on and off a 4 inch block with min assist as well.  Pt's O2 sats remained greater than 96% on room air throughout session with HR increasing to low 100s.  Pt completed 1 set of 10 reps for shoulder flexion in sitting using green band and min assist for technique.  Would try light resistance orange band next session instead.  Had pt ambulate back to his room to conclude session.  Pt left sitting in bedside recliner with call button and phone in reach.    Therapy Documentation Precautions:  Precautions Precautions: Fall Precaution Comments:  (02 dependent) Restrictions Weight Bearing Restrictions: No Other Position/Activity Restrictions:  (pain with lying flat)  Vital Signs: Therapy Vitals Temp: 97.4 F (36.3 C) Temp Source: Oral Pulse Rate: (!) 105 (with activity) BP: 107/68 Patient Position (if appropriate): Sitting Oxygen Therapy SpO2: 96 % O2 Device: Not Delivered Pain: Pain Assessment Pain Assessment: 0-10 Pain Score: 6  Faces Pain Scale: Hurts little more Pain Type: Acute pain Pain Location: Abdomen Pain  Orientation: Right Pain Descriptors / Indicators: Aching Pain Frequency: Constant Pain Onset: On-going Patients Stated Pain Goal: 3 Pain Intervention(s): Medication (See eMAR) ADL: See Function Navigator for Current Functional Status.   Therapy/Group: Individual Therapy  Deval Mroczka OTR/L 06/27/2016, 4:27 PM

## 2016-06-27 NOTE — Progress Notes (Signed)
Physical Therapy Session Note  Patient Details  Name: URI TURNBOUGH MRN: [id number] Date of Birth: [birth year]  Today's Date: 06/27/2016 PT Individual Time: 1101-1159 PT Individual Time Calculation (min): 58 min   Skilled Therapeutic Interventions/Progress Updates:    Pt up in recliner chair upon arrival, reports being tired but agreeable to session.  Exercise: seated AP, QS, Heel slides, LAQ, hip flexion- bilateral 1X10. Sit<>stand 2X5.  Transfers: sit<>stand supervision with rw and armrests. Cues for posture in standing.  Ambulation: Variable distances throughout session with total of 3 distances with max distance of 150 ft, using rw and supervision. W/C on tow due to fatigue. SpO2 after ambulation staying in 90s on RA. Cues for posture provided.  W/C propulsion: 100 ft using UE+LE until fatigued and then just using LEs.  Pt returned to recliner chair after session, left in care of nursing staff with all needs in reach.   Therapy Documentation Precautions:  Precautions Precautions: Fall Precaution Comments:  (02 dependent) Restrictions Weight Bearing Restrictions: No Other Position/Activity Restrictions:  (pain with lying flat) General:   Vital Signs: SpO2 - 93% or higher throughout session no RA.   Pain: 6/10 Rt side, monitored during session.  See Function Navigator for Current Functional Status.   Therapy/Group: Individual Therapy  Linard Millers, PT 06/27/2016, 12:05 PM

## 2016-06-28 ENCOUNTER — Inpatient Hospital Stay (HOSPITAL_COMMUNITY): Payer: 59 | Admitting: Physical Therapy

## 2016-06-28 ENCOUNTER — Inpatient Hospital Stay (HOSPITAL_COMMUNITY): Payer: 59

## 2016-06-28 DIAGNOSIS — N179 Acute kidney failure, unspecified: Secondary | ICD-10-CM

## 2016-06-28 DIAGNOSIS — I5023 Acute on chronic systolic (congestive) heart failure: Secondary | ICD-10-CM

## 2016-06-28 LAB — BASIC METABOLIC PANEL
ANION GAP: 9 (ref 5–15)
BUN: 36 mg/dL — ABNORMAL HIGH (ref 6–20)
CALCIUM: 9.5 mg/dL (ref 8.9–10.3)
CO2: 25 mmol/L (ref 22–32)
Chloride: 99 mmol/L — ABNORMAL LOW (ref 101–111)
Creatinine, Ser: 2.23 mg/dL — ABNORMAL HIGH (ref 0.61–1.24)
GFR, EST AFRICAN AMERICAN: 36 mL/min — AB (ref >60)
GFR, EST NON AFRICAN AMERICAN: 31 mL/min — AB (ref >60)
GLUCOSE: 91 mg/dL (ref 65–99)
Potassium: 5 mmol/L (ref 3.5–5.1)
Sodium: 133 mmol/L — ABNORMAL LOW (ref 135–145)

## 2016-06-28 MED ORDER — HYDROCODONE-ACETAMINOPHEN 5-325 MG PO TABS
0.5000 | ORAL_TABLET | Freq: Four times a day (QID) | ORAL | Status: DC | PRN
  Administered 2016-06-28: 0.5 via ORAL
  Administered 2016-06-29: 1 via ORAL
  Administered 2016-06-29 – 2016-06-30 (×2): 0.5 via ORAL
  Administered 2016-06-30: 1 via ORAL
  Administered 2016-06-30: 0.5 via ORAL
  Administered 2016-06-30 – 2016-07-01 (×2): 1 via ORAL
  Filled 2016-06-28 (×10): qty 1

## 2016-06-28 NOTE — Progress Notes (Signed)
[facility] PHYSICAL MEDICINE & REHABILITATION     PROGRESS NOTE  Subjective/Complaints:  Pt seen sitting up in his chair this AM.  He slept well overnight, but is tired this AM.  He asks when he can go home.    ROS: Denies CP, SOB, N/V/D.  Objective: Vital Signs: Blood pressure 111/74, pulse 94, temperature 98.1 F (36.7 C), temperature source Oral, resp. rate 16, height 5\' 10"  (1.778 m), weight 102.3 kg (225 lb 8.5 oz), SpO2 100 %. No results found.  Recent Labs  06/27/16 0515  WBC 8.0  HGB 12.1*  HCT 37.3*  PLT 258    Recent Labs  06/27/16 0515 06/28/16 0500  NA 133* 133*  K 5.2* 5.0  CL 101 99*  GLUCOSE 95 91  BUN 37* 36*  CREATININE 1.79* 2.23*  CALCIUM 9.3 9.5   CBG (last 3)  No results for input(s): GLUCAP in the last 72 hours.  Wt Readings from Last 3 Encounters:  06/28/16 102.3 kg (225 lb 8.5 oz)  06/24/16 97.7 kg (215 lb 6.4 oz)  05/05/16 109.8 kg (242 lb)    Physical Exam:  BP 111/74 (BP Location: Left Arm)   Pulse 94   Temp 98.1 F (36.7 C) (Oral)   Resp 16   Ht 5\' 10"  (1.778 m)   Wt 102.3 kg (225 lb 8.5 oz)   SpO2 100%   BMI 32.36 kg/m  Constitutional: NAD.  Well-developed. obese HENT: Normocephalic. Atraumatic.  Eyes: Right eye decreased vision, opaque. Ambylopia.  Cardiovascular: RRR. No JVD.  Respiratory: Effort normal and breath sounds normal. +[facility]: Soft. Bowel sounds are normal.  Musc: 1+ edema bilateral LE Neurological: He is alertand oriented.  Follow full commands Motor: 4+/5 throughout (unchanged) Skin warm and dry. A few ecchmoses Psych: Normal mood and affect.   Assessment/Plan: 1. Functional deficits secondary to debility which require 3+ hours per day of interdisciplinary therapy in a comprehensive inpatient rehab setting. Physiatrist is providing close team supervision and 24 hour management of active medical problems listed below. Physiatrist and rehab team continue to assess barriers to discharge/monitor patient  progress toward functional and medical goals.  Function:  Bathing Bathing position   Position: Shower  Bathing parts Body parts bathed by patient: Right arm, Left arm, Chest, Abdomen, Front perineal area, Buttocks, Right upper leg, Left upper leg, Right lower leg, Left lower leg Body parts bathed by helper: Back  Bathing assist Assist Level: Touching or steadying assistance(Pt > 75%)      Upper Body Dressing/Undressing Upper body dressing   What is the patient wearing?: Pull over shirt/dress     Pull over shirt/dress - Perfomed by patient: Thread/unthread right sleeve, Thread/unthread left sleeve, Put head through opening          Upper body assist Assist Level: Supervision or verbal cues      Lower Body Dressing/Undressing Lower body dressing   What is the patient wearing?: Pants, Underwear Underwear - Performed by patient: Thread/unthread right underwear leg, Thread/unthread left underwear leg, Pull underwear up/down   Pants- Performed by patient: Thread/unthread right pants leg, Thread/unthread left pants leg, Pull pants up/down       Socks - Performed by patient: Don/doff right sock, Don/doff left sock   Shoes - Performed by patient: Don/doff right shoe, Don/doff left shoe, Fasten left, Fasten right            Lower body assist Assist for lower body dressing: Touching or steadying assistance (Pt > 75%)  Toileting Toileting   Toileting steps completed by patient: Adjust clothing prior to toileting, Performs perineal hygiene, Adjust clothing after toileting   Toileting Assistive Devices: Grab bar or rail  Toileting assist Assist level: Supervision or verbal cues   Transfers Chair/bed transfer   Chair/bed transfer method: Ambulatory Chair/bed transfer assist level: Supervision or verbal cues Chair/bed transfer assistive device: Armrests, Medical sales representative     Max distance: 150 ft Assist level: Supervision or verbal cues   Wheelchair    Type: Manual Max wheelchair distance: 100 ft Assist Level: Supervision or verbal cues  Cognition Comprehension Comprehension assist level: Follows complex conversation/direction with extra time/assistive device  Expression Expression assist level: Expresses complex ideas: With extra time/assistive device  Social Interaction Social Interaction assist level: Interacts appropriately with others - No medications needed.  Problem Solving Problem solving assist level: Solves basic 90% of the time/requires cueing < 10% of the time  Memory Memory assist level: Recognizes or recalls 90% of the time/requires cueing < 10% of the time    Medical Problem List and Plan: 1. Debilitationsecondary to CHF exacerbation with sepsis  Cont CIR 2. DVT Prophylaxis/Anticoagulation: Subcutaneous heparin. Monitor for any bleeding episodes. Patient is ambulatory 3. Pain Management: Hydrocodone as needed 4. Mood: Xanax 0.25-0.5 mg 3 times a day as needed 5. Neuropsych: This patient iscapable of making decisions on hisown behalf. 6. Skin/Wound Care: Routine skin checks 7. Fluids/Electrolytes/Nutrition: Routine I&Os 8.Hypertension. Coreg 3.125 mg twice a day, hydralazine 10 mg 3 times a day, Isordil 5 mg twice a day  Torosemide restarted by HF team, appreciate recs  Controlled 4/24 9.CKD stage III.   Cr 2.23 on 4/24, increase likely from diuretic  Cont to monitor 10.CAD-ICD. No chest pain or shortness of breath 11.ID.Streptococcus viridans bacteremia. Completed anti-microbial antibiotic therapy. 12.Hyperlipidemia. Lipitor 13.History of gout. Colchicine 0.6 mg daily. Monitor for any flareup 14. Hyponatremia  Na 133 on 4/24  Cont to monitor 15. Hyperkalemia  K 5.0 on 4/24  Cont to monitor 16. 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

## 2016-06-28 NOTE — Progress Notes (Signed)
Occupational Therapy Session Note  Patient Details  Name: [name] MRN: [id number] Date of Birth: 1960/08/15  Today's Date: 06/28/2016 OT Individual Time: 1000-1100 OT Individual Time Calculation (min): 60 min    Short Term Goals: Week 1:  OT Short Term Goal 1 (Week 1): STG=LTG d/t ELOS  Skilled Therapeutic Interventions/Progress Updates:    1:1. Focus ADL retraining. Pt agreeable to bathe and dress at shower level. Pt reporting pain in side and RN administers medication. Pt ambulates/completes all trasfers with supervision with Vc for posture and safety awareness. Pt bathes 10/10 body parts seated on TTB with Vc to lean laterally to wash buttocks to conserve energy. Pt dons UB/LB clothing with supervision at sit to stand level with VC to don underwear and pants and advance both over hips together to conserve energy and decrease number of sit to stands. Pt ambulates to recliner to don B socks and shoes with VC to elevate foot on stool. Pt requires increased time and rest breaks with VC for pursed lip breathing. Pt O2 maintained >97% without supplemental O2 throughout session.   Therapy Documentation Precautions:  Precautions Precautions: Fall Precaution Comments:  (02 dependent) Restrictions Weight Bearing Restrictions: No Other Position/Activity Restrictions:  (pain with lying flat) General:   Vital Signs:   Pain: Pain Assessment Pain Assessment: 0-10 Pain Score: 3  Pain Type: Acute pain Pain Location: Abdomen Pain Orientation: Right Pain Descriptors / Indicators: Aching Pain Onset: On-going Patients Stated Pain Goal: 0 Pain Intervention(s): Repositioned  See Function Navigator for Current Functional Status.   Therapy/Group: Individual Therapy  Tonny Branch 06/28/2016, 10:22 AM

## 2016-06-28 NOTE — Progress Notes (Signed)
Social Work Patient ID: [name], male   DOB: [birth year], 56 y.o.   MRN: [id number]  Left pt's FMLA papers for him in his room. Have faxed the information to employer. See in am after team conference to discuss Goals and target discharge date.

## 2016-06-28 NOTE — Progress Notes (Signed)
Advanced Heart Failure Rounding Note  Primary Cardiologist: Dr. Haroldine Laws   Subjective:    Admitted 06/06/16 with acute respiratory failure requiring intubation secondary to volume overload and septic shock. 1/2 blood cultures positive for strep viridans.   Extubated 06/18/16.   Feels well. Doing well with rehab. Torsemide restarted yesterday. Weight unchanged. Denies dyspnea or bloating   Objective:   Weight Range: 102.3 kg (225 lb 8.5 oz) Body mass index is 32.36 kg/m.   Vital Signs:   Temp:  [97.4 F (36.3 C)-98.1 F (36.7 C)] 98.1 F (36.7 C) (04/24 0500) Pulse Rate:  [91-108] 94 (04/24 0500) Resp:  [16] 16 (04/24 0500) BP: (104-111)/(66-74) 111/74 (04/24 0500) SpO2:  [96 %-100 %] 100 % (04/24 0500) Weight:  [102.3 kg (225 lb 8.5 oz)] 102.3 kg (225 lb 8.5 oz) (04/24 0500) Last BM Date: 06/27/16 (as per report)  Weight change: Filed Weights   06/26/16 0437 06/27/16 0429 06/28/16 0500  Weight: 103.4 kg (227 lb 15.3 oz) 102.2 kg (225 lb 5 oz) 102.3 kg (225 lb 8.5 oz)    Intake/Output:   Intake/Output Summary (Last 24 hours) at 06/28/16 0653 Last data filed at 06/28/16 0500  Gross per 24 hour  Intake              360 ml  Output              175 ml  Net              185 ml     Physical Exam: General: Sitting in chair NAD HEENT: Normal except for mild R exophthalmos.  Neck: Supple. JVP hard to see  Looks mildly elevated . Carotids 2+ bilat; no bruits. No thyromegaly or nodule noted. Cor: PMI nondisplaced. RRR No m/r/g Lungs: Clear Dull on R base  Abdomen: soft, non-tender, mildly distended, no HSM. No bruits or masses. +BS  Extremities: no cyanosis, clubbing, rash, Trace edema  Neuro: alert & orientedx3, cranial nerves grossly intact. moves all 4 extremities w/o difficulty. Affect pleasant   Labs: CBC  Recent Labs  06/27/16 0515  WBC 8.0  NEUTROABS 5.4  HGB 12.1*  HCT 37.3*  MCV 89.0  PLT 161   Basic Metabolic Panel  Recent Labs   06/27/16 0515 06/28/16 0500  NA 133* 133*  K 5.2* 5.0  CL 101 99*  CO2 25 25  GLUCOSE 95 91  BUN 37* 36*  CREATININE 1.79* 2.23*  CALCIUM 9.3 9.5   Liver Function Tests  Recent Labs  06/27/16 0515  AST 99*  ALT 177*  ALKPHOS 106  BILITOT 0.9  PROT 6.8  ALBUMIN 2.9*   No results for input(s): LIPASE, AMYLASE in the last 72 hours. Cardiac Enzymes No results for input(s): CKTOTAL, CKMB, CKMBINDEX, TROPONINI in the last 72 hours.  BNP: BNP (last 3 results)  Recent Labs  06/06/16 1645 06/06/16 1925  BNP 796.0* 553.0*    ProBNP (last 3 results) No results for input(s): PROBNP in the last 8760 hours.   D-Dimer No results for input(s): DDIMER in the last 72 hours. Hemoglobin A1C No results for input(s): HGBA1C in the last 72 hours. Fasting Lipid Panel No results for input(s): CHOL, HDL, LDLCALC, TRIG, CHOLHDL, LDLDIRECT in the last 72 hours. Thyroid Function Tests No results for input(s): TSH, T4TOTAL, T3FREE, THYROIDAB in the last 72 hours.  Invalid input(s): FREET3  Other results:     Imaging/Studies:  No results found.    Medications:     Scheduled  Medications: . atorvastatin  40 mg Oral QHS  . carvedilol  3.125 mg Oral BID WC  . colchicine  0.6 mg Oral Daily  . feeding supplement (ENSURE ENLIVE)  237 mL Oral BID BM  . heparin  5,000 Units Subcutaneous Q8H  . hydrALAZINE  10 mg Oral Q8H  . isosorbide dinitrate  5 mg Oral BID  . loratadine  10 mg Oral Daily  . pantoprazole  40 mg Oral Q1200  . tobramycin-dexamethasone   Right Eye BID  . torsemide  10 mg Oral Daily    Infusions:   PRN Medications: ALPRAZolam, guaiFENesin, HYDROcodone-acetaminophen, ondansetron **OR** ondansetron (ZOFRAN) IV, sodium chloride, sorbitol   Assessment/Plan   1. Acute hypoxic respiratory failure:  - Much improved. No longer requiring 02.  - Continue IS.   - May eventually be candidate for diaphragm plication, but would need to continue to improve.  2.  AKI on CKD III  - Creatinine climbing back up  3. Chronic systolic CHF: EF 27% on 04/11/34 - Weight up but does not appear overly volume overloaded. Will continue po torsemide for now. Watch renal function and weights closely.  - Decrease carvedilol to 3.125 bid  - Continue hydralazine 10 mg TID.  If BP drops further will hold  - Continue isordil 5 mg BID.  - Restart torsemide 10 mg daily.  - No ACE/ARB/ARNI with CKD and Hyper K.  4. Paralzyed right hemidiaphragm.  - Doing well now with extubation and weaned off 02.  - Has asked about diaphragm plication, but would needs to continue to improve before this can be considered.  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 Otherwise stable.  6. Septic Shock due to CAP - Initial cultures + 1 of 2 strep viridans. Compelted ABX course. Follow up Cultures negative.  - TEE negative for endocarditis or infected lead.  7. Deconditioning - Progressing with CIR.    Length of Stay: 4  Glori Bickers, MD  06/28/2016, 6:53 AM  Advanced Heart Failure Team Pager [phone] (M-F; 7a - 4p)  Please contact [facility] for night-coverage after hours (4p -7a ) and weekends on [email or web address]   Patient seen and examined with the above-signed Advanced Practice Provider and/or Housestaff. I personally reviewed laboratory data, imaging studies and relevant notes. I independently examined the patient and formulated the important aspects of the plan. I have edited the note to reflect any of my changes or salient points. I have personally discussed the plan with the patient and/or family.  Doing well. Getting stronger. Weight trending up. Will restart torsemide. Otherwise continue current regimen.   Glori Bickers, MD  6:53 AM

## 2016-06-28 NOTE — Progress Notes (Signed)
Physical Therapy Session Note  Patient Details  Name: [name] MRN: [id number] Date of Birth: 1961-02-16  Today's Date: 06/28/2016 PT Individual Time: 0800-0908 and 1304-1400 PT Individual Time Calculation (min): 68 min and 56 min  Short Term Goals: Week 1:  PT Short Term Goal 1 (Week 1): STG equal LTG  Skilled Therapeutic Interventions/Progress Updates:   Treatment 1: Patient in recliner upon arrival, c/o R sided pain, premedicated. Gait training using RW x 150 ft with distant supervision and verbal cues for upright posture. After ambulation Sp02 98% on ra and HR 93. Sit <> stand transfers with supervision and verbal cues for hand placement. Patient ambulates short distances without AD with supervision and demonstrates wide BOS for sit <> stand transfers without AD. Discussed f/u HHPT and recommendation to wait to be cleared by MD for driving, patient verbalized understanding. Patient demonstrates moderate fall risk as noted by score of 46/56 on Berg Balance Scale. Performed standing <> 1/2 kneeling <> tall kneeling transitional movements using mat table in front for BUE support for BLE NMR, x 1 leading with RLE and x 1 leading with LLE. Performed tall kneeling without UE support with focus on hip extension to neutral due to flexed forward posture. Patient reports relief of R sided pain in tall kneeling leaning forward on mat table propped on elbows. Patient reporting fatigue after exercise with Sp02 99% on ra and HR 100 and provided prolonged seated rest break. Gait training back to room without AD with supervision approx 20 ft but patient dependent on hall rail, therefore utilized RW to return to room with supervision. Patient reports he won't be able to use the walker at home, educated on keeping son's walker upstairs and his walker downstairs since he will be at home with his son who is wheelchair user while wife is at work, patient verbalized understanding. Patient left sitting in recliner  with all needs within reach.   Treatment 2: Patient in recliner upon arrival, reporting fatigue and 5/10 R side pain, premedicated. Patient required increased time to initiate standing from recliner with supervision. Gait training using RW x 150 ft with supervision. Performed NuStep using BUE/BLE at level 3 x 10 min with 1 rest break at 5 min with focus on pacing, endurance, and generalized strengthening. Standing prolonged heel cord stretch on wedge without UE support or single UE support while engaging in tabletop task x 4 min. Sidelying clamshells for B hip abduction strengthening x 15 each LE and standing wall squats x 10 with cues for postural control and technique. Patient unable to tolerate supine propped on wedge due to difficulty breathing in supine. Patient required prolonged seated rest break before ambulating back to room with supervision. Patient left sitting in recliner with all needs within reach.   Therapy Documentation Precautions:  Precautions Precautions: Fall Precaution Comments:  (02 dependent) Restrictions Weight Bearing Restrictions: No Other Position/Activity Restrictions:  (pain with lying flat) Pain: Pain Assessment Pain Assessment: 0-10 Pain Score: 5 Pain Type: Acute pain Pain Location: Abdomen Pain Orientation: Right Pain Descriptors / Indicators: Aching Pain Onset: On-going Patients Stated Pain Goal: 0 Pain Intervention(s): Repositioned, rest   See Function Navigator for Current Functional Status.   Therapy/Group: Individual Therapy  Yumi Insalaco, Murray Hodgkins 06/28/2016, 9:05 AM

## 2016-06-29 ENCOUNTER — Encounter (HOSPITAL_COMMUNITY): Payer: Self-pay

## 2016-06-29 ENCOUNTER — Inpatient Hospital Stay (HOSPITAL_COMMUNITY): Payer: 59 | Admitting: Physical Therapy

## 2016-06-29 ENCOUNTER — Inpatient Hospital Stay (HOSPITAL_COMMUNITY): Payer: 59 | Admitting: Occupational Therapy

## 2016-06-29 DIAGNOSIS — I952 Hypotension due to drugs: Secondary | ICD-10-CM

## 2016-06-29 LAB — BASIC METABOLIC PANEL
Anion gap: 9 (ref 5–15)
BUN: 38 mg/dL — ABNORMAL HIGH (ref 6–20)
CHLORIDE: 100 mmol/L — AB (ref 101–111)
CO2: 25 mmol/L (ref 22–32)
CREATININE: 2.64 mg/dL — AB (ref 0.61–1.24)
Calcium: 9.2 mg/dL (ref 8.9–10.3)
GFR, EST AFRICAN AMERICAN: 29 mL/min — AB (ref >60)
GFR, EST NON AFRICAN AMERICAN: 25 mL/min — AB (ref >60)
Glucose, Bld: 117 mg/dL — ABNORMAL HIGH (ref 65–99)
Potassium: 5.1 mmol/L (ref 3.5–5.1)
SODIUM: 134 mmol/L — AB (ref 135–145)

## 2016-06-29 LAB — HEPATIC FUNCTION PANEL
ALBUMIN: 3.2 g/dL — AB (ref 3.5–5.0)
ALK PHOS: 120 U/L (ref 38–126)
ALT: 218 U/L — ABNORMAL HIGH (ref 17–63)
AST: 113 U/L — ABNORMAL HIGH (ref 15–41)
BILIRUBIN INDIRECT: 0.8 mg/dL (ref 0.3–0.9)
Bilirubin, Direct: 0.1 mg/dL (ref 0.1–0.5)
TOTAL PROTEIN: 7.3 g/dL (ref 6.5–8.1)
Total Bilirubin: 0.9 mg/dL (ref 0.3–1.2)

## 2016-06-29 MED ORDER — SODIUM CHLORIDE 0.9 % IV BOLUS (SEPSIS)
250.0000 mL | Freq: Once | INTRAVENOUS | Status: DC
Start: 2016-06-29 — End: 2016-07-01

## 2016-06-29 MED ORDER — ACETAMINOPHEN 325 MG PO TABS
325.0000 mg | ORAL_TABLET | ORAL | Status: DC | PRN
  Administered 2016-06-29: 650 mg via ORAL
  Filled 2016-06-29: qty 2

## 2016-06-29 NOTE — Progress Notes (Signed)
[place] [facility]IT[facility]     PROGRESS NOTE  Subjective/Complaints:  Pt sitting up in his chair this AM.  He slept well overnight. He has questions about his Pulm meds.  ROS: Denies CP, SOB, N/V/D.  Objective: Vital Signs: Blood pressure 92/62, pulse 94, temperature 97.9 F (36.6 C), temperature source Oral, resp. rate 18, height 5\' 10"  (1.778 m), weight 102.4 kg (225 lb 12 oz), SpO2 98 %. No results found.  Recent Labs  06/27/16 0515  WBC 8.0  HGB 12.1*  HCT 37.3*  PLT 258    Recent Labs  06/27/16 0515 06/28/16 0500  NA 133* 133*  K 5.2* 5.0  CL 101 99*  GLUCOSE 95 91  BUN 37* 36*  CREATININE 1.79* 2.23*  CALCIUM 9.3 9.5   CBG (last 3)  No results for input(s): GLUCAP in the last 72 hours.  Wt Readings from Last 3 Encounters:  06/29/16 102.4 kg (225 lb 12 oz)  06/24/16 97.7 kg (215 lb 6.4 oz)  05/05/16 109.8 kg (242 lb)    Physical Exam:  BP 92/62 (BP Location: Right Arm)   Pulse 94   Temp 97.9 F (36.6 C) (Oral)   Resp 18   Ht 5\' 10"  (1.778 m)   Wt 102.4 kg (225 lb 12 oz)   SpO2 98%   BMI 32.39 kg/m  Constitutional: NAD.  Well-developed. obese HENT: Normocephalic. Atraumatic.  Eyes: Right eye decreased vision, opaque. Ambylopia.  Cardiovascular: RRR. No JVD.  Respiratory: Effort normal and breath sounds normal.  GI: Soft. Bowel sounds are normal.  Musc: 1+ edema bilateral LE Neurological: He is alertand oriented.  Follow full commands Motor: 4+/5 throughout (stable) Skin: Wwarm and dry. Intact. Psych: Normal mood and affect.   Assessment/Plan: 1. Functional deficits secondary to debility which require 3+ hours per day of interdisciplinary therapy in a comprehensive inpatient rehab setting. Physiatrist is providing close team supervision and 24 hour management of active medical problems listed below. Physiatrist and rehab team continue to assess barriers to discharge/monitor patient progress toward functional and medical  goals.  Function:  Bathing Bathing position   Position: Shower  Bathing parts Body parts bathed by patient: Right arm, Left arm, Chest, Abdomen, Front perineal area, Buttocks, Right upper leg, Left upper leg, Right lower leg, Left lower leg Body parts bathed by helper: Back  Bathing assist Assist Level: Supervision or verbal cues      Upper Body Dressing/Undressing Upper body dressing   What is the patient wearing?: Pull over shirt/dress     Pull over shirt/dress - Perfomed by patient: Thread/unthread right sleeve, Thread/unthread left sleeve, Put head through opening          Upper body assist Assist Level: Set up      Lower Body Dressing/Undressing Lower body dressing   What is the patient wearing?: Pants, Underwear, Socks, Shoes Underwear - Performed by patient: Thread/unthread right underwear leg, Thread/unthread left underwear leg, Pull underwear up/down   Pants- Performed by patient: Thread/unthread right pants leg, Thread/unthread left pants leg, Pull pants up/down       Socks - Performed by patient: Don/doff right sock, Don/doff left sock   Shoes - Performed by patient: Don/doff right shoe, Don/doff left shoe, Fasten left, Fasten right            Lower body assist Assist for lower body dressing: Touching or steadying assistance (Pt > 75%)      Toileting Toileting   Toileting steps completed by patient: Adjust  clothing prior to toileting, Performs perineal hygiene, Adjust clothing after toileting   Toileting Assistive Devices: Grab bar or rail  Toileting assist Assist level: Supervision or verbal cues   Transfers Chair/bed transfer   Chair/bed transfer method: Ambulatory Chair/bed transfer assist level: Supervision or verbal cues Chair/bed transfer assistive device: Armrests     Locomotion Ambulation     Max distance: 150 ft Assist level: Supervision or verbal cues   Wheelchair   Type: Manual Max wheelchair distance: 100 ft Assist Level:  Supervision or verbal cues  Cognition Comprehension Comprehension assist level: Follows complex conversation/direction with extra time/assistive device  Expression Expression assist level: Expresses complex ideas: With extra time/assistive device  Social Interaction Social Interaction assist level: Interacts appropriately with others - No medications needed.  Problem Solving Problem solving assist level: Solves basic 90% of the time/requires cueing < 10% of the time  Memory Memory assist level: Recognizes or recalls 90% of the time/requires cueing < 10% of the time    Medical Problem List and Plan: 1. Debilitationsecondary to CHF exacerbation with sepsis  Cont CIR 2. DVT Prophylaxis/Anticoagulation: Subcutaneous heparin. Monitor for any bleeding episodes. Patient is ambulatory 3. Pain Management: Hydrocodone as needed 4. Mood: Xanax 0.25-0.5 mg 3 times a day as needed 5. Neuropsych: This patient iscapable of making decisions on hisown behalf. 6. Skin/Wound Care: Routine skin checks 7. Fluids/Electrolytes/Nutrition: Routine I&Os 8.Hypertension. Coreg 3.125 mg twice a day, hydralazine 10 mg 3 times a day, Isordil 5 mg twice a day  Torosemide restarted by HF team, appreciate recs  Hypotensive 4/25 9.CKD stage III.   Cr 2.23 on 4/24, increase likely from diuretic, pending for 4/25  Cont to monitor 10.CAD-ICD. No chest pain or shortness of breath 11.ID.Streptococcus viridans bacteremia. Completed anti-microbial antibiotic therapy. 12.Hyperlipidemia. Lipitor 13.History of gout. Colchicine 0.6 mg daily. Monitor for any flareup 14. Hyponatremia  Na 133 on 4/24, pending 4/25  Cont to monitor 15. Hyperkalemia  K 5.0 on 4/24, pending 47/25  Cont to monitor 16. ABLA  Hb 12.1 on 4/23  Cont to monitor 17. Combined CHF Filed Weights   06/27/16 0429 06/28/16 0500 06/29/16 0511  Weight: 102.2 kg (225 lb 5 oz) 102.3 kg (225 lb 8.5 oz) 102.4 kg (225 lb 12 oz)   Weaned off  supplemental O2  LOS (Days) 5 A FACE TO FACE EVALUATION WAS PERFORMED  [name] 06/29/2016 8:15 AM

## 2016-06-29 NOTE — Progress Notes (Signed)
Social Work Patient ID: [name], male   DOB: 1960-05-16, 56 y.o.   MRN: [id number]  Met with pt and wife to discuss team conference goals of mod/I level and discharge 4/27. Wife to see him in therapies tomorrow and ask any questions she had. MD monitoring his labs, due to sodium is low. O2 has been weaned and not required. Work on discharge for Friday.

## 2016-06-29 NOTE — Progress Notes (Signed)
Physical Therapy Session Note  Patient Details  Name: [name] MRN: [id number] Date of Birth: 11/30/1960  Today's Date: 06/29/2016 PT Individual Time: [phone] and 1020--1045 PT Individual Time Calculation (min): 70 min and 25 min  Short Term Goals: Week 1:  PT Short Term Goal 1 (Week 1): STG equal LTG  Skilled Therapeutic Interventions/Progress Updates:   Treatment 1: Patient in recliner upon arrival. Performed seated LE therex while waiting for nursing to pass meds. SIt <> stand and gait throughout rehab unit using RW up to 150 ft with mod I and forward flexed posture. Seated stretching to facilitate increased trunk mobility due to patient constantly guarding R side: R/L lateral trunk stretch with overhead reaching and propped over physio ball, physioball reaches from floor to overhead behind patient to facilitate lumbar flexion/extension, and physioball rollouts forward/back and diagonals to R and L x 10 in all 3 directions. Attempted supine stretch into thoracic/lumbar extension over physioball but patient fearful of falling. In stairwell to simulate home access to second floor, patient negotiated up/down 12 stairs using R rail ascending with supervision, standing rest break at top of stairs and seated rest break at bottom of stairs. Patient surprised at how exhausting it was to go up and down the stairs. He reports that for the last couple of years, when he was tired after working all day or if he was short of breath he would crawl up/down the stairs on his hands and knees.  Recommended that patient place a chair at top and bottom of stairs for rest breaks and limit the number of times he goes up/down throughout the day for energy conservation, patient verbalized understanding. Performed simulated car transfer to small SUV height using RW with patient initially attempting to step into car while standing on one leg and required verbal cues/demonstration for safer technique to sit first then  bring BLE in/out of car. Patient demonstrated car transfer using RW with supervision. After rest break, patient ambulated back to room and was met by wife (who works at hospital). Discussed patient's progress to date, decreased endurance, and recommendation for chairs at top and bottom of stairs. Patient left in recliner with all needs within reach.  Treatment 2: Patient in recliner, reports premedicated for R sided pain. Patient made modified independent using RW in controlled environment of patient's room to increase mobility to address activity tolerance and endurance. Transfers and gait to and from gym using RW with mod I. Performed NuStep using BUE/BLE at level 5 x 10 min for muscular endurance and strengthening but required prolonged seated rest break before ambulating back to room. Patient returned to room, voided in standing with mod I using RW, and left sitting in recliner with all needs within reach.      Therapy Documentation Precautions:  Precautions Precautions: Fall Precaution Comments:  (02 dependent) Restrictions Weight Bearing Restrictions: No Other Position/Activity Restrictions:  (pain with lying flat) Pain: Pain Assessment Pain Assessment: 0-10 Pain Score: 5  Pain Type: Chronic pain Pain Location: Flank Pain Orientation: Right Pain Descriptors / Indicators: Aching Pain Onset: On-going Pain Intervention(s): Repositioned;Ambulation/increased activity;Rest   See Function Navigator for Current Functional Status.   Therapy/Group: Individual Therapy  Riki Berninger, Murray Hodgkins 06/29/2016, 9:27 AM

## 2016-06-29 NOTE — Progress Notes (Signed)
Occupational Therapy Session Note  Patient Details  Name: [name] MRN: [id number] Date of Birth: October 15, 1960  Today's Date: 06/29/2016 OT Individual Time: 1304-1400 OT Individual Time Calculation (min): 56 min    Short Term Goals: Week 1:  OT Short Term Goal 1 (Week 1): STG=LTG d/t ELOS  Skilled Therapeutic Interventions/Progress Updates:    Pt completed bathing and dressing sit to stand at the sink this session.  He initially stated he was too fatigued and was having too much pain in his right side to participate.  Pt also receiving IV fluids for dehydration as well, but finally agreed to participate.  Supervision for gathering clothing and towels with use of the RW for support.  Supervision for all aspects of bathing but he did not remove his socks or wash his feet.  Oxygen sats still maintained above 95 % throughout session.  Pt transferred back to the recliner to conclude session with call button and phone in reach.    Therapy Documentation Precautions:  Precautions Precautions: Fall Precaution Comments:  (02 dependent) Restrictions Weight Bearing Restrictions: No Other Position/Activity Restrictions:  (pain with lying flat)  Vital Signs: Therapy Vitals Temp: 97.5 F (36.4 C) Temp Source: Oral Pulse Rate: (!) 106 Resp: 20 BP: 103/67 Patient Position (if appropriate): Sitting Oxygen Therapy SpO2: 99 % O2 Device: Not Delivered Pain: Pain Assessment Pain Score: 6  Pain Type: Chronic pain Pain Location: Flank Pain Orientation: Right Pain Descriptors / Indicators: Discomfort Pain Onset: On-going Pain Intervention(s): Medication (See eMAR);RepositionedNo report of pain  ADL: See Function Navigator for Current Functional Status.   Therapy/Group: Individual Therapy  Vilda Zollner OTR/L 06/29/2016, 4:13 PM

## 2016-06-29 NOTE — Progress Notes (Signed)
Physical Therapy Session Note  Patient Details  Name: [name] MRN: [id number] Date of Birth: 02/04/1961  Today's Date: 06/29/2016 PT Individual Time: 1100-1155 PT Individual Time Calculation (min): 55 min   Short Term Goals: Week 1:  PT Short Term Goal 1 (Week 1): STG equal LTG  Skilled Therapeutic Interventions/Progress Updates:   Pt received sitting in recliner and agreeable to PT. Stand pivot transfer to [facility] with supervision assist from PT with min cues for AD management.   Gait training with RW and supervision assist in various environments; 164ft in crowded hospital lobby and 149ft in hospital gift shop. Min cues for awareness of obstacles in crowded environment.   Standing balance/tolerance to toss ball off trampoline 2 bouts x 2 minutes anteriorly, 1 bout x 1.5 minutes to R and L each, trunk rotation with each toss when throwing to R and L. Close supervision from PT for safety with noted difficulty with ball toss to L compared to the R.    PT transported pt back to room in [facility]. Stand pivot transfer to recliner with distant supervision assist and only min cues for safety.      Pt's SpO2 remained >95% throughout treatment, but required repeated rest breaks due to fatigue.    Therapy Documentation Precautions:  Precautions Precautions: Fall Precaution Comments:  (02 dependent) Restrictions Weight Bearing Restrictions: No Other Position/Activity Restrictions:  (pain with lying flat)    Pain: Pain Assessment Pain Assessment: 0-10 Pain Score: 5  Pain Type: Chronic pain Pain Location: Flank Pain Orientation: Right Pain Descriptors / Indicators: Aching Pain Onset: On-going Pain Intervention(s): Repositioned;Ambulation/increased activity;Rest  See Function Navigator for Current Functional Status.   Therapy/Group: Individual Therapy  Lorie Phenix 06/29/2016, 9:29 AM

## 2016-06-29 NOTE — Progress Notes (Signed)
Advanced Heart Failure Rounding Note  Primary Cardiologist: Dr. Haroldine Laws   Subjective:    Admitted 06/06/16 with acute respiratory failure requiring intubation secondary to volume overload and septic shock. 1/2 blood cultures positive for strep viridans.   Extubated 06/18/16.   Feeling OK. Denies SOB but fatigued with multiple hours of PT.  Denies lightheadedness or dizziness. Denies SOB. Appetite OK.   Creatinine 2.6 up from 2.2 yesterday.   Objective:   Weight Range: 225 lb 12 oz (102.4 kg) Body mass index is 32.39 kg/m.   Vital Signs:   Temp:  [97.9 F (36.6 C)-98.5 F (36.9 C)] 97.9 F (36.6 C) (04/25 0511) Pulse Rate:  [94-95] 94 (04/25 0511) Resp:  [18] 18 (04/25 0511) BP: (90-103)/(52-73) 92/62 (04/25 0511) SpO2:  [98 %] 98 % (04/25 0511) Weight:  [225 lb 12 oz (102.4 kg)] 225 lb 12 oz (102.4 kg) (04/25 0511) Last BM Date: 06/28/16  Weight change: Filed Weights   06/27/16 0429 06/28/16 0500 06/29/16 0511  Weight: 225 lb 5 oz (102.2 kg) 225 lb 8.5 oz (102.3 kg) 225 lb 12 oz (102.4 kg)    Intake/Output:   Intake/Output Summary (Last 24 hours) at 06/29/16 0955 Last data filed at 06/29/16 0900  Gross per 24 hour  Intake              300 ml  Output              600 ml  Net             -300 ml     Physical Exam: General: Seated in chair. NAD.  HEENT: Normal. Mild R exophthalmus  Neck: Supple. JVD 6-7. Carotids 2+ bilat; no bruits. No thyromegaly or nodule noted. Cor: PMI nondisplaced. RRR, No M/G/R noted Lungs: Clear. Mildly decreased R base.  Abdomen: soft, non-tender, distended, no HSM. No bruits or masses. +BS  Extremities: no cyanosis, clubbing, rash, R and LLE no edema.  Neuro: alert & orientedx3, cranial nerves grossly intact. moves all 4 extremities w/o difficulty. Affect pleasant   Labs: CBC  Recent Labs  06/27/16 0515  WBC 8.0  NEUTROABS 5.4  HGB 12.1*  HCT 37.3*  MCV 89.0  PLT 938   Basic Metabolic Panel  Recent Labs  06/28/16 0500 06/29/16 0759  NA 133* 134*  K 5.0 5.1  CL 99* 100*  CO2 25 25  GLUCOSE 91 117*  BUN 36* 38*  CREATININE 2.23* 2.64*  CALCIUM 9.5 9.2   Liver Function Tests  Recent Labs  06/27/16 0515  AST 99*  ALT 177*  ALKPHOS 106  BILITOT 0.9  PROT 6.8  ALBUMIN 2.9*   No results for input(s): LIPASE, AMYLASE in the last 72 hours. Cardiac Enzymes No results for input(s): CKTOTAL, CKMB, CKMBINDEX, TROPONINI in the last 72 hours.  BNP: BNP (last 3 results)  Recent Labs  06/06/16 1645 06/06/16 1925  BNP 796.0* 553.0*    ProBNP (last 3 results) No results for input(s): PROBNP in the last 8760 hours.   D-Dimer No results for input(s): DDIMER in the last 72 hours. Hemoglobin A1C No results for input(s): HGBA1C in the last 72 hours. Fasting Lipid Panel No results for input(s): CHOL, HDL, LDLCALC, TRIG, CHOLHDL, LDLDIRECT in the last 72 hours. Thyroid Function Tests No results for input(s): TSH, T4TOTAL, T3FREE, THYROIDAB in the last 72 hours.  Invalid input(s): FREET3  Other results:  Imaging/Studies:  No results found.    Medications:     Scheduled Medications: .  atorvastatin  40 mg Oral QHS  . carvedilol  3.125 mg Oral BID WC  . colchicine  0.6 mg Oral Daily  . feeding supplement (ENSURE ENLIVE)  237 mL Oral BID BM  . heparin  5,000 Units Subcutaneous Q8H  . hydrALAZINE  10 mg Oral Q8H  . isosorbide dinitrate  5 mg Oral BID  . loratadine  10 mg Oral Daily  . pantoprazole  40 mg Oral Q1200  . tobramycin-dexamethasone   Right Eye BID    Infusions: . sodium chloride      PRN Medications: acetaminophen, ALPRAZolam, guaiFENesin, HYDROcodone-acetaminophen, ondansetron **OR** ondansetron (ZOFRAN) IV, sodium chloride, sorbitol   Assessment/Plan   1. Acute hypoxic respiratory failure:  - Improved. No longer requiring 02.  - Continue IS.   - May eventually be candidate for diaphragm plication, but would need to continue to improve.  2. AKI  on CKD III  - Creatinine continues to climb.  Hold torsemide and give 250 cc of NS.  - Worst case this could be coming from low output HF. Will continue to follow very closely.  3. Chronic systolic CHF: EF 32% on 11/05/89 - Weight stable and does not appear volume overloaded.  - Continue carvedilol 3.125 bid for now.  - Continue hydralazine 10 mg TID.  If BP drops further will hold  - Continue isordil 5 mg BID.  - Hold torsemide with rising creatinine.  - No ACE/ARB/ARNI with CKD and Hyper K.  4. Paralzyed right hemidiaphragm.  - Stable. Extubated. Off 02.  - Has asked about diaphragm plication, but would needs to continue to improve before this can be considered.  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 Stable.  6. Septic Shock due to CAP - Initial cultures + 1 of 2 strep viridans. Compelted ABX course. Follow up Cultures negative.  - TEE negative for endocarditis or infected lead.  - No change.  7. Deconditioning - Progress with CIR. Remains fatigued.  8. Transaminitis - Noted 06/27/16. Will recheck today. Adds to suspicion for low output as patient has previously been on statin without difficulty.   Creatinine continues to trend up. Holding diuretics and giving gentle IVF. ? If this could possible be component of low output. If continues, pt may need RHC.   Length of Stay: 5  Annamaria Helling  06/29/2016, 9:55 AM  Advanced Heart Failure Team Pager [phone] (M-F; 7a - 4p)  Please contact [facility] for night-coverage after hours (4p -7a ) and weekends on [email or web address]  .Patient seen and examined with the above-signed Advanced Practice Provider and/or Housestaff. I personally reviewed laboratory data, imaging studies and relevant notes. I independently examined the patient and formulated the important aspects of the plan. I have edited the note to reflect any of my changes or salient points. I have personally discussed the plan with the patient and/or  family.  I am very concerned about the constellation of low BP, worsening renal function and elevated LFTs in setting of very low EF. Suspect probable low output despite the fact that his cardiac status recently tolerated severe infection without requiring inotropes. Will hydrate very gently. Hold diuretics and reassess in am. If liver/kidney function not improving will proceed with RHC.   Glori Bickers, MD  11:23 PM

## 2016-06-30 ENCOUNTER — Inpatient Hospital Stay (HOSPITAL_COMMUNITY): Payer: 59 | Admitting: Physical Therapy

## 2016-06-30 ENCOUNTER — Inpatient Hospital Stay (HOSPITAL_COMMUNITY): Payer: 59 | Admitting: Occupational Therapy

## 2016-06-30 LAB — HEPATIC FUNCTION PANEL
ALBUMIN: 3 g/dL — AB (ref 3.5–5.0)
ALK PHOS: 104 U/L (ref 38–126)
ALK PHOS: 117 U/L (ref 38–126)
ALT: 175 U/L — AB (ref 17–63)
ALT: 181 U/L — AB (ref 17–63)
AST: 78 U/L — ABNORMAL HIGH (ref 15–41)
AST: 78 U/L — ABNORMAL HIGH (ref 15–41)
Albumin: 3.4 g/dL — ABNORMAL LOW (ref 3.5–5.0)
BILIRUBIN DIRECT: 0.3 mg/dL (ref 0.1–0.5)
BILIRUBIN INDIRECT: 0.8 mg/dL (ref 0.3–0.9)
BILIRUBIN TOTAL: 1.1 mg/dL (ref 0.3–1.2)
Bilirubin, Direct: 0.2 mg/dL (ref 0.1–0.5)
Indirect Bilirubin: 0.6 mg/dL (ref 0.3–0.9)
TOTAL PROTEIN: 6.8 g/dL (ref 6.5–8.1)
Total Bilirubin: 0.8 mg/dL (ref 0.3–1.2)
Total Protein: 7.4 g/dL (ref 6.5–8.1)

## 2016-06-30 LAB — CBC WITH DIFFERENTIAL/PLATELET
BASOS PCT: 1 %
Basophils Absolute: 0 10*3/uL (ref 0.0–0.1)
EOS ABS: 0.2 10*3/uL (ref 0.0–0.7)
Eosinophils Relative: 2 %
HCT: 37.1 % — ABNORMAL LOW (ref 39.0–52.0)
Hemoglobin: 12.3 g/dL — ABNORMAL LOW (ref 13.0–17.0)
LYMPHS ABS: 1.4 10*3/uL (ref 0.7–4.0)
Lymphocytes Relative: 17 %
MCH: 29.2 pg (ref 26.0–34.0)
MCHC: 33.2 g/dL (ref 30.0–36.0)
MCV: 88.1 fL (ref 78.0–100.0)
MONOS PCT: 12 %
Monocytes Absolute: 1 10*3/uL (ref 0.1–1.0)
Neutro Abs: 5.7 10*3/uL (ref 1.7–7.7)
Neutrophils Relative %: 68 %
PLATELETS: 180 10*3/uL (ref 150–400)
RBC: 4.21 MIL/uL — ABNORMAL LOW (ref 4.22–5.81)
RDW: 15 % (ref 11.5–15.5)
WBC: 8.2 10*3/uL (ref 4.0–10.5)

## 2016-06-30 LAB — BASIC METABOLIC PANEL
Anion gap: 10 (ref 5–15)
BUN: 39 mg/dL — AB (ref 6–20)
CALCIUM: 9.3 mg/dL (ref 8.9–10.3)
CHLORIDE: 101 mmol/L (ref 101–111)
CO2: 23 mmol/L (ref 22–32)
CREATININE: 2.63 mg/dL — AB (ref 0.61–1.24)
GFR calc Af Amer: 29 mL/min — ABNORMAL LOW (ref >60)
GFR calc non Af Amer: 25 mL/min — ABNORMAL LOW (ref >60)
Glucose, Bld: 85 mg/dL (ref 65–99)
Potassium: 5.1 mmol/L (ref 3.5–5.1)
SODIUM: 134 mmol/L — AB (ref 135–145)

## 2016-06-30 NOTE — Progress Notes (Signed)
[place] PHYSICAL MEDICINE & REHABILITATION     PROGRESS NOTE  Subjective/Complaints:  Pt seen sitting up in his chair this AM.  He slept well overnight.  He would like to go home and then has questions about what he is supposed to do when he gets there.  ROS: Denies CP, SOB, N/V/D.  Objective: Vital Signs: Blood pressure 107/80, pulse 92, temperature 97.8 F (36.6 C), temperature source Oral, resp. rate 18, height 5\' 10"  (1.778 m), weight 101.6 kg (224 lb), SpO2 92 %. No results found.  Recent Labs  06/30/16 0647  WBC 8.2  HGB 12.3*  HCT 37.1*  PLT 180    Recent Labs  06/29/16 0759 06/30/16 0647  NA 134* 134*  K 5.1 5.1  CL 100* 101  GLUCOSE 117* 85  BUN 38* 39*  CREATININE 2.64* 2.63*  CALCIUM 9.2 9.3   CBG (last 3)  No results for input(s): GLUCAP in the last 72 hours.  Wt Readings from Last 3 Encounters:  06/30/16 101.6 kg (224 lb)  06/24/16 97.7 kg (215 lb 6.4 oz)  05/05/16 109.8 kg (242 lb)    Physical Exam:  BP 107/80 (BP Location: Right Arm)   Pulse 92   Temp 97.8 F (36.6 C) (Oral)   Resp 18   Ht 5\' 10"  (1.778 m)   Wt 101.6 kg (224 lb)   SpO2 92%   BMI 32.14 kg/m  Constitutional: NAD.  Well-developed. obese HENT: Normocephalic. Atraumatic.  Eyes: Right eye decreased vision, opaque. Ambylopia.  Cardiovascular: RRR. No JVD.  Respiratory: Effort normal and breath sounds normal.  GI: Soft. Bowel sounds are normal.  Musc: No edema, no tenderness. Neurological: He is alertand oriented.  Follow full commands Motor: 4+/5 throughout (unchanged) Skin: Wwarm and dry. Intact. Psych: Normal mood and affect.   Assessment/Plan: 1. Functional deficits secondary to debility which require 3+ hours per day of interdisciplinary therapy in a comprehensive inpatient rehab setting. Physiatrist is providing close team supervision and 24 hour management of active medical problems listed below. Physiatrist and rehab team continue to assess barriers to  discharge/monitor patient progress toward functional and medical goals.  Function:  Bathing Bathing position   Position: Wheelchair/chair at sink  Bathing parts Body parts bathed by patient: Right arm, Left arm, Chest, Abdomen, Right upper leg, Left upper leg, Right lower leg, Left lower leg, Front perineal area, Buttocks Body parts bathed by helper: Back  Bathing assist Assist Level: Supervision or verbal cues      Upper Body Dressing/Undressing Upper body dressing   What is the patient wearing?: Pull over shirt/dress     Pull over shirt/dress - Perfomed by patient: Thread/unthread right sleeve, Thread/unthread left sleeve, Put head through opening          Upper body assist Assist Level: Set up   Set up : To obtain clothing/put away  Lower Body Dressing/Undressing Lower body dressing   What is the patient wearing?: Pants, Underwear Underwear - Performed by patient: Thread/unthread right underwear leg, Thread/unthread left underwear leg, Pull underwear up/down   Pants- Performed by patient: Thread/unthread right pants leg, Thread/unthread left pants leg, Pull pants up/down       Socks - Performed by patient: Don/doff right sock, Don/doff left sock   Shoes - Performed by patient: Don/doff right shoe, Don/doff left shoe, Fasten left, Fasten right            Lower body assist Assist for lower body dressing: Touching or steadying assistance (Pt > 75%)  Toileting Toileting   Toileting steps completed by patient: Adjust clothing prior to toileting, Performs perineal hygiene, Adjust clothing after toileting   Toileting Assistive Devices: Grab bar or rail  Toileting assist Assist level: No help/no cues   Transfers Chair/bed transfer   Chair/bed transfer method: Ambulatory Chair/bed transfer assist level: No Help, no cues, assistive device, takes more than a reasonable amount of time Chair/bed transfer assistive device: Armrests, Environmental health practitioner     Max distance: 150 ft Assist level: No help, No cues, assistive device, takes more than a reasonable amount of time   Wheelchair   Type: Manual Max wheelchair distance: 100 ft Assist Level: Supervision or verbal cues  Cognition Comprehension Comprehension assist level: Follows complex conversation/direction with extra time/assistive device  Expression Expression assist level: Expresses complex ideas: With extra time/assistive device  Social Interaction Social Interaction assist level: Interacts appropriately with others - No medications needed.  Problem Solving Problem solving assist level: Solves basic 90% of the time/requires cueing < 10% of the time  Memory Memory assist level: Recognizes or recalls 90% of the time/requires cueing < 10% of the time    Medical Problem List and Plan: 1. Debilitationsecondary to CHF exacerbation with sepsis  Cont CIR 2. DVT Prophylaxis/Anticoagulation: Subcutaneous heparin. Monitor for any bleeding episodes. Patient is ambulatory 3. Pain Management: Hydrocodone as needed 4. Mood: Xanax 0.25-0.5 mg 3 times a day as needed 5. Neuropsych: This patient iscapable of making decisions on hisown behalf. 6. Skin/Wound Care: Routine skin checks 7. Fluids/Electrolytes/Nutrition: Routine I&Os 8.Hypertension. Coreg 3.125 mg twice a day, hydralazine 10 mg 3 times a day, Isordil 5 mg twice a day  Torosemide restarted by HF team, appreciate recs  Controlled 4/26 9.CKD stage III.   Cr 2.64 on 4/26, started on IVF per HF  Cont to monitor 10.CAD-ICD. No chest pain or shortness of breath 11.ID.Streptococcus viridans bacteremia. Completed anti-microbial antibiotic therapy. 12.Hyperlipidemia. Lipitor 13.History of gout. Colchicine 0.6 mg daily. Monitor for any flareup 14. Hyponatremia  Na 134 on 4/24, pending 4/26  Cont to monitor 15. Hyperkalemia  K 5.1 on 4/26  Cont to monitor 16. ABLA  Hb 12.3 on 4/26  Cont to  monitor 17. Combined CHF Filed Weights   06/28/16 0500 06/29/16 0511 06/30/16 0502  Weight: 102.3 kg (225 lb 8.5 oz) 102.4 kg (225 lb 12 oz) 101.6 kg (224 lb)   Weaned off supplemental O2  LOS (Days) 6 A FACE TO FACE EVALUATION WAS PERFORMED  [name] Lorie Phenix 06/30/2016 8:20 AM

## 2016-06-30 NOTE — Plan of Care (Signed)
Problem: RH PAIN MANAGEMENT Goal: RH STG PAIN MANAGED AT OR BELOW PT'S PAIN GOAL Pain less than 2.  Outcome: Progressing Managed at goal 2/10

## 2016-06-30 NOTE — Progress Notes (Signed)
RT instructed pt on the use of flutter valve. Pt able to demonstrate back good technique with coaching.

## 2016-06-30 NOTE — Discharge Summary (Signed)
Discharge summary job # [id number]

## 2016-06-30 NOTE — Patient Care Conference (Signed)
Inpatient RehabilitationTeam Conference and Plan of Care Update Date: 06/29/2016   Time: 2:10 PM    Patient Name: [name]      Medical Record Number: [id number]  Date of Birth: 1960-10-12 Sex: Male         Room/Bed: 4M05C/4M05C-01 Payor Info: Payor: Theme park manager / Plan: Theme park manager OTHER / Product Type: *No Product type* /    Admitting Diagnosis: debility  Admit Date/Time:  06/24/2016  3:33 PM Admission Comments: No comment available   Primary Diagnosis:  Debilitated Principal Problem: Debilitated  Patient Active Problem List   Diagnosis Date Noted  . Hypotension due to drugs   . Stage 3 chronic kidney disease   . Hyperkalemia   . Hyponatremia   . Supplemental oxygen dependent   . Debilitated 06/24/2016  . Acute on chronic combined systolic and diastolic CHF (congestive heart failure) ([place])   . Coronary artery disease involving native coronary artery of native heart without angina pectoris   . Community acquired pneumonia   . Tachypnea   . Acute blood loss anemia   . Bacteremia due to Streptococcus 06/14/2016  . Central line infection   . Infected defibrillator ([name])   . History of ETT   . Acute on chronic respiratory failure with hypoxemia ([name])   . Pressure injury of skin 06/08/2016  . Acute respiratory failure with hypoxia ([name]) 06/06/2016  . CKD (chronic kidney disease) stage 3, GFR 30-59 ml/min 02/01/2015  . 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 CHF exacerbation ([name]) 01/31/2015  . Acute on chronic heart failure ([name]) 01/31/2015  . AKI (acute kidney injury) ([name]) 01/31/2015  . Gout flare 01/31/2015  . OSA on CPAP 01/31/2015  . Leukocytosis 01/31/2015  . 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 History of cardiac arrhythmia 09/19/2014  . CKD (chronic kidney disease) stage 2, GFR 60-89 ml/min 09/19/2014  . Other fatigue 09/19/2014  . Hip pain 09/19/2014  . History of gout 09/19/2014  . History of bladder carcinoma 09/19/2014  . Paraganglioma  ([name]) 02/10/2014  . Chest discomfort 09/10/2013  . Bladder cancer ([name]) 11/09/2012  . Preoperative cardiovascular examination 09/11/2012  . Malignant tumor of urinary bladder ([name]) 07/13/2012  . Chronic combined systolic and diastolic CHF (congestive heart failure) ([name]) 06/28/2012  . Gout 05/17/2012  . Inappropriate shocks from ICD -secondary T wave oversensing in the context of hyperkalemia 04/12/2012  . Automatic implantable cardioverter-defibrillator in situ 05/17/2011  . 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 ORTHOSTATIC DIZZINESS 11/03/2009  . VENTRICULAR TACHYCARDIA 10/20/2008  . SYSTOLIC HEART FAILURE, CHRONIC 10/20/2008  . SOB (shortness of breath) 03/25/2008  . Obstructive sleep apnea 03/12/2007  . Essential hypertension, benign 03/12/2007  . CARDIOMYOPATHY 03/12/2007    Expected Discharge Date: Expected Discharge Date: 07/01/16  Team Members Present: Physician leading conference: Dr. Delice Lesch Social Worker Present: Ovidio Kin, LCSW Nurse Present: Heather Roberts, RN PT Present: Carney Living, PT;Other (comment) OT Present: Clyda Greener, OT SLP Present: Windell Moulding, SLP PPS Coordinator present : Daiva Nakayama, RN, CRRN     Current Status/Progress Goal Weekly Team Focus  Medical   Debilitation secondary to CHF exacerbation with sepsis  Improve endurance, mobility, multiple medical issues  See above   Bowel/Bladder   continent of bowel & bladder, LBM 06/28/16  continue to be continent  continue to moniotr for changes   Swallow/Nutrition/ Hydration             ADL's   supervision-MIN A for balance during functional mobility/transfers; Pt performs  UB/LB self care with supervision  MOD I  balance retraining, pt/family education, endurance, ADL retraining, therapeutic exercise   Mobility   supervision-mod I  mod I  functional mobility training, endurance, standing balance, strengthening, pt education   Communication             Safety/Cognition/  Behavioral Observations            Pain   c/o pain to the right flank area, pain scale 6/10, has vicodin/norco prn, takes 0.5 tab  pain scale <6  continue to asses & treat as needed   Skin   various scars to abdomen, back, arms, ecchymosis on abdomen, no skin breakdown  no new areas of skin breakdown  continue to assess q shift      *See Care Plan and progress notes for long and short-term goals.  Barriers to Discharge: Endurance, combined CHF, ABLA, hyponatremia, CKD, hyponatremia, HTN with hypotension    Possible Resolutions to Barriers:  Therapies, HF team follow up, follow labs    Discharge Planning/Teaching Needs:  Home with wife and son who is wheelchair bound. Doing well and making good progress here.      Team Discussion:  Goals mod/I-supervision level, which is close to meeting. Still has pain in his right side which MD is aware of. DC O2 weaned off. Watching labs sodium low. Decreased endurance working on. Made mod/I in room for confidence. Pt is content in not moving much  Revisions to Treatment Plan:  DC 4/27   Continued Need for Acute Rehabilitation Level of Care: The patient requires daily medical management by a physician with specialized training in physical medicine and rehabilitation for the following conditions: Daily direction of a multidisciplinary physical rehabilitation program to ensure safe treatment while eliciting the highest outcome that is of practical value to the patient.: Yes Daily medical management of patient stability for increased activity during participation in an intensive rehabilitation regime.: Yes Daily analysis of laboratory values and/or radiology reports with any subsequent need for medication adjustment of medical intervention for : Cardiac problems;Blood pressure problems;Renal problems;Other  [name] 06/30/2016, 10:48 AM

## 2016-06-30 NOTE — Progress Notes (Signed)
Occupational Therapy Discharge Summary  Patient Details  Name: [name] MRN: [id number] Date of Birth: 09-Dec-1960  Today's Date: 06/30/2016 OT Individual Time: 1400-1503 OT Individual Time Calculation (min): 63 min   Session Note:  Pt completed bathing and dressing during session.  Pt with increased fatigue, incorporated energy conservation during bathing by sitting on the shower seat.  Pt with HR increasing to 108 with transfer into the shower but O2 remained at 97% or better.  With transfer out to the bedside chair HR increased to 114.  He continues to need multiple rest breaks secondary to fatigue.  Increased difficulty donning shoes in sitting,  Incorporated use of stool to prop his foot on.  Discussed use of a sock aide as well which pt had tried earlier in the stay, but he reports that this did not work out well.  Finished session with pt in bedside chair with call button and phone in reach.  Pt is modified independent with use of the RW in his room at this time.    Patient has met 7 of 7 long term goals due to improved balance and ability to compensate for deficits.  Patient to discharge at overall Modified Independent level.  Patient's care partner unavailable to provide the necessary physical assistance at discharge.    Reasons goals not met: NA  Recommendation:  Patient will benefit from ongoing skilled OT services in outpatient setting to continue to advance functional skills in the area of BADL, Vocation and Reduce care partner burden.  Recommend Outpatient OT for follow-up to increase endurance for ADLs as well as continuing to work on balance as it impacts ADL function.     Equipment: No equipment provided  Reasons for discharge: treatment goals met and discharge from hospital  Patient/family agrees with progress made and goals achieved: Yes  OT Discharge Precautions/Restrictions  Precautions Precautions: Fall Precaution Comments: R side painful due to hemidiaphragm  paralysis Restrictions Weight Bearing Restrictions: No  Pain Pain Assessment Pain Assessment: No/denies pain ADL  See Function Section of chart  Vision/Perception  Vision- Assessment Eye Alignment: Within Functional Limits Perception Perception: Within Functional Limits Praxis Praxis: Intact  Cognition Overall Cognitive Status: Within Functional Limits for tasks assessed Orientation Level: Oriented X4 Attention: Focused Memory: Appears intact Awareness: Appears intact Problem Solving: Appears intact Safety/Judgment: Appears intact Sensation Sensation Light Touch: Appears Intact Stereognosis: Appears Intact Hot/Cold: Appears Intact Proprioception: Appears Intact Additional Comments: Pt does report slight numbness in the fingertips. Coordination Gross Motor Movements are Fluid and Coordinated: Yes Fine Motor Movements are Fluid and Coordinated: Yes Motor  Motor Motor: Within Functional Limits Mobility  Transfers Transfers: Sit to Stand;Stand to Sit Sit to Stand: 6: Modified independent (Device/Increase time);With armrests;With upper extremity assist Stand to Sit: With armrests;6: Modified independent (Device/Increase time)  Trunk/Postural Assessment  Cervical Assessment Cervical Assessment: Exceptions to [facility] (limited cervical extension noted) Thoracic Assessment Thoracic Assessment: Exceptions to [facility] (thoracic kyphosis) Lumbar Assessment Lumbar Assessment: Exceptions to [facility] (posterior pelvic tilt ) Postural Control Postural Control: Within Functional Limits  Balance Balance Balance Assessed: Yes Standardized Balance Assessment Standardized Balance Assessment: Berg Balance Test Berg Balance Test Sit to Stand: Able to stand without using hands and stabilize independently Standing Unsupported: Able to stand safely 2 minutes Sitting with Back Unsupported but Feet Supported on Floor or Stool: Able to sit safely and securely 2 minutes Stand to Sit: Sits safely  with minimal use of hands Transfers: Able to transfer safely, minor use of hands Standing Unsupported  with Eyes Closed: Able to stand 10 seconds safely Standing Ubsupported with Feet Together: Able to place feet together independently and stand 1 minute safely From Standing, Reach Forward with Outstretched Arm: Can reach forward >12 cm safely (5") From Standing Position, Pick up Object from Floor: Able to pick up shoe, needs supervision From Standing Position, Turn to Look Behind Over each Shoulder: Turn sideways only but maintains balance Turn 360 Degrees: Able to turn 360 degrees safely but slowly Standing Unsupported, Alternately Place Feet on Step/Stool: Able to stand independently and safely and complete 8 steps in 20 seconds Standing Unsupported, One Foot in Front: Able to plae foot ahead of the other independently and hold 30 seconds Standing on One Leg: Tries to lift leg/unable to hold 3 seconds but remains standing independently Total Score: 46 Static Sitting Balance Static Sitting - Balance Support: Feet supported Static Sitting - Level of Assistance: 7: Independent Dynamic Sitting Balance Dynamic Sitting - Balance Support: During functional activity Dynamic Sitting - Level of Assistance: 6: Modified independent (Device/Increase time) Static Standing Balance Static Standing - Balance Support: During functional activity Static Standing - Level of Assistance: 6: Modified independent (Device/Increase time) Dynamic Standing Balance Dynamic Standing - Balance Support: During functional activity Dynamic Standing - Level of Assistance: 6: Modified independent (Device/Increase time) Extremity/Trunk Assessment RUE Assessment RUE Assessment: Within Functional Limits LUE Assessment LUE Assessment: Within Functional Limits   See Function Navigator for Current Functional Status.  Afra Tricarico OTR/L 06/30/2016, 4:33 PM

## 2016-06-30 NOTE — Discharge Instructions (Signed)
Inpatient Rehab Discharge Instructions  DARWYN PONZO Discharge date and time: No discharge date for patient encounter.   Activities/Precautions/ Functional Status: Activity: activity as tolerated Diet: cardiac diet Wound Care: none needed Functional status:  ___ No restrictions     ___ Walk up steps independently ___ 24/7 supervision/assistance   ___ Walk up steps with assistance ___ Intermittent supervision/assistance  ___ Bathe/dress independently ___ Walk with walker     _x__ Bathe/dress with assistance ___ Walk Independently    ___ Shower independently ___ Walk with assistance    ___ Shower with assistance ___ No alcohol     ___ Return to work/school ________  Special Instructions:    COMMUNITY REFERRALS UPON DISCHARGE:    Home Health:   PT & OT  [place]   Date of last service:07/01/2016  Medical Equipment/Items Ordered:ROLLING WALKER  Agency/Supplier:[facility]  [place], [place] FAXED INTO COMPANY    My questions have been answered and I understand these instructions. I will adhere to these goals and the provided educational materials after my discharge from the hospital.  Patient/Caregiver Signature _______________________________ Date __________  Clinician Signature _______________________________________ Date __________  Please bring this form and your medication list with you to all your follow-up doctor's appointments.

## 2016-06-30 NOTE — Progress Notes (Signed)
Physical Therapy Discharge Summary  Patient Details  Name: [name] MRN: [id number] Date of Birth: August 21, 1960  Patient has met 10 of 10 long term goals due to improved activity tolerance, improved balance, improved postural control, increased strength, decreased pain and ability to compensate for deficits.  Patient to discharge at a household ambulatory level Modified Independent.   Patient's care partner unavailable to provide the necessary physical assistance at discharge.  Reasons goals not met: NA  Recommendation:  Patient will benefit from ongoing skilled PT services in outpatient setting to continue to advance safe functional mobility, address ongoing impairments in cardiovascular endurance, pain, strength, dynamic standing balance, and minimize fall risk.  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 Restrictions Weight Bearing Restrictions: No Pain  5/10 R side pain, premedicated Vision/Perception   No change from baseline  Cognition Overall Cognitive Status: Within Functional Limits for tasks assessed Sensation Sensation Light Touch: Appears Intact Stereognosis: Appears Intact Hot/Cold: Appears Intact Proprioception: Appears Intact Coordination Gross Motor Movements are Fluid and Coordinated: Yes Fine Motor Movements are Fluid and Coordinated: Yes Motor  Motor Motor: Within Functional Limits  Locomotion  Ambulation Ambulation: Yes Ambulation/Gait Assistance: 6: Modified independent (Device/Increase time) Ambulation Distance (Feet): 200 Feet Assistive device: Rolling walker Gait Gait: Yes Gait Pattern: Impaired Gait Pattern: Trunk flexed Gait velocity: decreased Stairs / Additional Locomotion Stairs: Yes Stairs Assistance:  6: Modified independent (Device/Increase time) Stair Management Technique: Two rails;Step to pattern Number of Stairs: 12 Height of Stairs: 6 Ramp: 6: Modified independent (Device) Wheelchair Mobility Wheelchair Mobility: No  Trunk/Postural Assessment  Cervical Assessment Cervical Assessment: Within Functional Limits Thoracic Assessment Thoracic Assessment: Exceptions to [facility] (kyphosis) Lumbar Assessment Lumbar Assessment: Exceptions to [facility] (posterior pelvic tilt) Postural Control Postural Control: Within Functional Limits  Balance Balance Balance Assessed: Yes Standardized Balance Assessment Standardized Balance Assessment: Berg Balance Test Berg Balance Test Sit to Stand: Able to stand without using hands and stabilize independently Standing Unsupported: Able to stand safely 2 minutes Sitting with Back Unsupported but Feet Supported on Floor or Stool: Able to sit safely and securely 2 minutes Stand to Sit: Sits safely with minimal use of hands Transfers: Able to transfer safely, minor use of hands Standing Unsupported with Eyes Closed: Able to stand 10 seconds safely Standing Ubsupported with Feet Together: Able to place feet together independently and stand 1 minute safely From Standing, Reach Forward with Outstretched Arm: Can reach forward >12 cm safely (5") From Standing Position, Pick up Object from Floor: Able to pick up shoe, needs supervision From Standing Position, Turn to Look Behind Over each Shoulder: Turn sideways only but maintains balance Turn 360 Degrees: Able to turn 360 degrees safely but slowly Standing Unsupported, Alternately Place Feet on Step/Stool: Able to stand independently and safely and complete 8 steps in 20 seconds Standing Unsupported, One Foot in Front: Able to plae foot ahead of the other independently and hold 30 seconds Standing on One Leg: Tries to lift leg/unable to hold 3 seconds but remains standing independently Total Score: 46 Dynamic  Standing Balance Dynamic Standing - Balance Support: During functional activity;No upper extremity supported Dynamic Standing - Level of Assistance: 6: Modified independent (Device/Increase time) Extremity Assessment  RUE Assessment RUE Assessment: Within Functional Limits LUE Assessment LUE Assessment: Within Functional Limits RLE Assessment RLE Assessment: Within Functional Limits LLE  Assessment LLE Assessment: Within Functional Limits   See Function Navigator for Current Functional Status.  Sharif Rendell, Murray Hodgkins 06/30/2016, 2:10 PM

## 2016-06-30 NOTE — Progress Notes (Signed)
Physical Therapy Session Note  Patient Details  Name: [name] MRN: [id number] Date of Birth: [birth year]  Today's Date: 06/30/2016 PT Individual Time: 0845-1000 and 1100-1200 PT Individual Time Calculation (min): 75 min and 60 min  Short Term Goals: Week 1:  PT Short Term Goal 1 (Week 1): STG equal LTG  Skilled Therapeutic Interventions/Progress Updates:   Treatment 1: Patient in recliner upon arrival, c/o 5/10 R side pain, RN made aware and administered pain medication during session. Session focused on gait training multiple trials throughout rehab unit up to 200 ft at a time in home and controlled environments, up/down ramp, and across uneven woodchip surface using RW, functional transfers including low compliant furniture and simulated car transfer to SUV height using RW, bed mobility on regular bed to simulate home environment, up/down 12 (6") stairs using 2 rails, and retrieving object from floor x 2 with modified independence. Patient performed NuStep using BUE/BLE at level 4 x 10 min without rest break for generalized strengthening and endurance. Initiated OTAGO Level B HEP exercises for balance, endurance, and falls prevention: squats using RW for BUE support x 10 and backwards gait 2 x 10 steps. After prolonged seated rest break, patient ambulated back to room and left sitting in recliner with all needs within reach. RN made aware that patient demonstrating increased difficulty breathing and increased coughing this date.    Treatment 2: Patient in recliner, 4/10 R side pain, premedicated. Gait to and from rehab gym using RW with mod I and forward flexed posture. Patient asking about how to stand from low toilet height, educated on pushing up from toilet seat with both hands or one hand on seat and one hand on RW, patient verbalized understanding but declined to practice actual toilet transfer. Continued Level B OTAGO HEP exercises with handout provided: figure 8 walking around cones x  3, sit <> stand without UE support x 10 with wide BOS, sidestepping to R and L 2 x 10 ft each direction, tandem stance leading with R/L LE, and single limb stance R/L LE. For balance tasks, patient used hand support on counter top as needed. FTSS = 44 seconds, indicating increased risk for falls. Patient with no further questions/concerns regarding discharge home planned tomorrow. Patient left sitting in recliner, mod I in room.   Five times Sit to Stand Test (FTSS) Method: Use a straight back chair with a solid seat that is 16-18" high. Ask participant to sit on the chair with arms folded across their chest.   Instructions: "Stand up and sit down as quickly as possible 5 times, keeping your arms folded across your chest."   Measurement: Stop timing when the participant stands the 5th time.  TIME: ___44 sec___ (in seconds)  Times > 13.6 seconds is associated with increased disability and morbidity (Guralnik, 2000) Times > 15 seconds is predictive of recurrent falls in healthy individuals aged 64 and older (Buatois, et al., 2008) Normal performance values in community dwelling individuals aged 67 and older (Bohannon, 2006): o 60-69 years: 11.4 seconds o 70-79 years: 12.6 seconds o 80-89 years: 14.8 seconds  MCID: ? 2.3 seconds for Vestibular Disorders Mariah Milling, 2006)  Therapy Documentation Precautions:  Precautions Precautions: Fall Precaution Comments:  (02 dependent) Restrictions Weight Bearing Restrictions: No Other Position/Activity Restrictions:  (pain with lying flat)  See Function Navigator for Current Functional Status.   Therapy/Group: Individual Therapy  Tanganika Barradas, Murray Hodgkins 06/30/2016, 11:37 AM

## 2016-06-30 NOTE — Progress Notes (Signed)
Advanced Heart Failure Rounding Note  Primary Cardiologist: Dr. Haroldine Laws   Subjective:    Admitted 06/06/16 with acute respiratory failure requiring intubation secondary to volume overload and septic shock. 1/2 blood cultures positive for strep viridans.   Extubated 06/18/16.   Feeling OK but remains fatigued.  No lightheadedness or dizziness. Denies SOB.  Doesn't have much appetite.   Creatinine remains elevated but stable at 2.6 compared to yesterday. BUN 38 -> 39. LFTs trended down slightly.   Objective:   Weight Range: 224 lb (101.6 kg) Body mass index is 32.14 kg/m.   Vital Signs:   Temp:  [97.5 F (36.4 C)-97.8 F (36.6 C)] 97.8 F (36.6 C) (04/26 0502) Pulse Rate:  [92-106] 92 (04/26 0502) Resp:  [18-20] 18 (04/26 0502) BP: (102-107)/(67-80) 107/80 (04/26 0502) SpO2:  [92 %-99 %] 92 % (04/26 0502) Weight:  [224 lb (101.6 kg)] 224 lb (101.6 kg) (04/26 0502) Last BM Date: 06/29/16  Weight change: Filed Weights   06/28/16 0500 06/29/16 0511 06/30/16 0502  Weight: 225 lb 8.5 oz (102.3 kg) 225 lb 12 oz (102.4 kg) 224 lb (101.6 kg)    Intake/Output:   Intake/Output Summary (Last 24 hours) at 06/30/16 1014 Last data filed at 06/30/16 0936  Gross per 24 hour  Intake              480 ml  Output              250 ml  Net              230 ml     Physical Exam: General: Fatigued appearing.  No resp difficulty. HEENT: normal x/ mild R exopthalmus Neck: supple. JVD 6-7. Carotids 2+ bilat; no bruits. No thyromegaly or nodule noted. Cor: PMI nondisplaced. RRR, No M/G/R noted Lungs: CTAB, Diminished R basilar sounds.  Abdomen: soft, non-tender, distended, no HSM. No bruits or masses. +BS  Extremities: no cyanosis, clubbing, rash, BLE with trace to 1+ edema in ankles.  Neuro: alert & orientedx3, cranial nerves grossly intact. moves all 4 extremities w/o difficulty. Affect flat but appropriate.   Labs: CBC  Recent Labs  06/30/16 0647  WBC 8.2  NEUTROABS 5.7    HGB 12.3*  HCT 37.1*  MCV 88.1  PLT 818   Basic Metabolic Panel  Recent Labs  06/29/16 0759 06/30/16 0647  NA 134* 134*  K 5.1 5.1  CL 100* 101  CO2 25 23  GLUCOSE 117* 85  BUN 38* 39*  CREATININE 2.64* 2.63*  CALCIUM 9.2 9.3   Liver Function Tests  Recent Labs  06/29/16 1250 06/30/16 0647  AST 113* 78*  ALT 218* 175*  ALKPHOS 120 104  BILITOT 0.9 0.8  PROT 7.3 6.8  ALBUMIN 3.2* 3.0*   No results for input(s): LIPASE, AMYLASE in the last 72 hours. Cardiac Enzymes No results for input(s): CKTOTAL, CKMB, CKMBINDEX, TROPONINI in the last 72 hours.  BNP: BNP (last 3 results)  Recent Labs  06/06/16 1645 06/06/16 1925  BNP 796.0* 553.0*    ProBNP (last 3 results) No results for input(s): PROBNP in the last 8760 hours.   D-Dimer No results for input(s): DDIMER in the last 72 hours. Hemoglobin A1C No results for input(s): HGBA1C in the last 72 hours. Fasting Lipid Panel No results for input(s): CHOL, HDL, LDLCALC, TRIG, CHOLHDL, LDLDIRECT in the last 72 hours. Thyroid Function Tests No results for input(s): TSH, T4TOTAL, T3FREE, THYROIDAB in the last 72 hours.  Invalid input(s): FREET3  Other results:  Imaging/Studies:  No results found.    Medications:     Scheduled Medications: . atorvastatin  40 mg Oral QHS  . carvedilol  3.125 mg Oral BID WC  . colchicine  0.6 mg Oral Daily  . feeding supplement (ENSURE ENLIVE)  237 mL Oral BID BM  . heparin  5,000 Units Subcutaneous Q8H  . hydrALAZINE  10 mg Oral Q8H  . isosorbide dinitrate  5 mg Oral BID  . loratadine  10 mg Oral Daily  . pantoprazole  40 mg Oral Q1200  . tobramycin-dexamethasone   Right Eye BID    Infusions: . sodium chloride      PRN Medications: acetaminophen, ALPRAZolam, guaiFENesin, HYDROcodone-acetaminophen, ondansetron **OR** ondansetron (ZOFRAN) IV, sodium chloride, sorbitol   Assessment/Plan   1. Acute hypoxic respiratory failure:  - Stable. No longer on 02. Not  requiring 02 and sats 98% on RA per RN.   - May eventually be candidate for diaphragm plication, but would need to continue to improve.  2. AKI on CKD III  - Creatinine stabilized. Continue to hold torsemide.  - Low threshold to investigate further for low output HF.  Stabilization a good sign but pt remains fatigued.   3. Chronic systolic CHF: EF 02% on 09/06/51 - Weight down 1 lb and volume status looks OK.  - Will hold coreg for now with some concerns for low output. Pressures somewhat improved this am.  - Continue hydralazine 10 mg TID.    - Continue isordil 5 mg BID.  - Continue to hold torsemide for now until creatinine improves. May not need as much diuretic at home.  - No ACE/ARB/ARNI with CKD and Hyper K.  4. Paralzyed right hemidiaphragm.  - Stable. Some discomfort. Off 02.  - Has asked about diaphragm plication, but would needs to continue to improve before this can be considered.  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 Stable. .  6. Septic Shock due to CAP - Initial cultures + 1 of 2 strep viridans. Compelted ABX course. Follow up Cultures negative.  - TEE negative for endocarditis or infected lead.  - No change.  7. Deconditioning - From CIR perspective OK for discharge tomorrow. If needs to be managed further medically will need re-admission to medical side.  Will discuss with MD. At very least will need very close HF follow up next week.  8. Transaminitis - Noted 06/27/16. Continue to rise into 06/29/16 but stable/improved this am. Continue to follow.   Continue to liberalize po intake. Continue to hold diuretics.  Pt tenuous from medical perspective but cleared for home tomorrow from [facility] perspective.  If remains tenuous may need to consider RHC and re-admit to medical side. Will discuss with MD.   Length of Stay: [place], PA-C  06/30/2016, 10:14 AM  Advanced Heart Failure Team Pager [phone] (M-F; 7a - 4p)  Please contact [facility] for night-coverage after hours (4p -7a ) and weekends on [email or web address]  Patient seen and examined with the above-signed Advanced Practice Provider and/or Housestaff. I personally reviewed laboratory data, imaging studies and relevant notes. I independently examined the patient and formulated the important aspects of the plan. I have edited the note to reflect any of my changes or salient points. I have personally discussed the plan with the patient and/or family.  He is a bit better today but still very fatigued. Volume status ok. Renal function stable. LFT trending down. Will continue to hold  diuretics and b-blocker. We will reassess in am. If getting worse will need RHC prior to d/c.   Glori Bickers, MD  2:43 PM

## 2016-06-30 NOTE — Progress Notes (Signed)
Social Work Patient ID: [name], male   DOB: 1960/11/17, 56 y.o.   MRN: [id number]  Discussed follow up with pt regarding home health versus OP, he does feel he can get to OP. Have made referral  To [facility] for follow up and continued improvement. He needs to be pushed to move around and not be so sedentary. Feels ready and can't wait to go home tomorrow.

## 2016-07-01 ENCOUNTER — Ambulatory Visit: Payer: 59 | Admitting: Cardiology

## 2016-07-01 LAB — CBC WITH DIFFERENTIAL/PLATELET
Basophils Absolute: 0 10*3/uL (ref 0.0–0.1)
Basophils Relative: 0 %
EOS PCT: 2 %
Eosinophils Absolute: 0.2 10*3/uL (ref 0.0–0.7)
HEMATOCRIT: 35.9 % — AB (ref 39.0–52.0)
Hemoglobin: 12 g/dL — ABNORMAL LOW (ref 13.0–17.0)
LYMPHS ABS: 1.7 10*3/uL (ref 0.7–4.0)
LYMPHS PCT: 19 %
MCH: 29.3 pg (ref 26.0–34.0)
MCHC: 33.4 g/dL (ref 30.0–36.0)
MCV: 87.8 fL (ref 78.0–100.0)
Monocytes Absolute: 1 10*3/uL (ref 0.1–1.0)
Monocytes Relative: 12 %
NEUTROS ABS: 5.8 10*3/uL (ref 1.7–7.7)
Neutrophils Relative %: 67 %
PLATELETS: 176 10*3/uL (ref 150–400)
RBC: 4.09 MIL/uL — ABNORMAL LOW (ref 4.22–5.81)
RDW: 14.9 % (ref 11.5–15.5)
WBC: 8.7 10*3/uL (ref 4.0–10.5)

## 2016-07-01 LAB — BASIC METABOLIC PANEL
Anion gap: 10 (ref 5–15)
BUN: 38 mg/dL — AB (ref 6–20)
CHLORIDE: 98 mmol/L — AB (ref 101–111)
CO2: 24 mmol/L (ref 22–32)
Calcium: 9.1 mg/dL (ref 8.9–10.3)
Creatinine, Ser: 2.73 mg/dL — ABNORMAL HIGH (ref 0.61–1.24)
GFR calc Af Amer: 28 mL/min — ABNORMAL LOW (ref >60)
GFR calc non Af Amer: 24 mL/min — ABNORMAL LOW (ref >60)
GLUCOSE: 86 mg/dL (ref 65–99)
POTASSIUM: 5.3 mmol/L — AB (ref 3.5–5.1)
Sodium: 132 mmol/L — ABNORMAL LOW (ref 135–145)

## 2016-07-01 MED ORDER — TORSEMIDE 20 MG PO TABS: ORAL_TABLET | ORAL | 0 refills | Status: DC

## 2016-07-01 MED ORDER — ISOSORBIDE DINITRATE 5 MG PO TABS: 5.0000 mg | ORAL_TABLET | Freq: Two times a day (BID) | ORAL | 0 refills | Status: DC

## 2016-07-01 MED ORDER — TORSEMIDE 20 MG PO TABS
20.0000 mg | ORAL_TABLET | Freq: Every day | ORAL | Status: DC
  Administered 2016-07-01: 20 mg via ORAL
  Filled 2016-07-01: qty 1

## 2016-07-01 MED ORDER — ATORVASTATIN CALCIUM 40 MG PO TABS: 40.0000 mg | ORAL_TABLET | Freq: Every day | ORAL | 2 refills | Status: AC

## 2016-07-01 MED ORDER — COLCHICINE 0.6 MG PO TABS: 0.6000 mg | ORAL_TABLET | Freq: Every day | ORAL | 0 refills | Status: DC

## 2016-07-01 MED ORDER — HYDRALAZINE HCL 10 MG PO TABS: 10.0000 mg | ORAL_TABLET | Freq: Three times a day (TID) | ORAL | 1 refills | Status: DC

## 2016-07-01 MED ORDER — LORATADINE 10 MG PO TABS: 10.0000 mg | ORAL_TABLET | Freq: Every day | ORAL | 0 refills | Status: AC

## 2016-07-01 MED ORDER — ALPRAZOLAM 0.25 MG PO TABS: 0.2500 mg | ORAL_TABLET | Freq: Three times a day (TID) | ORAL | 0 refills | Status: DC | PRN

## 2016-07-01 MED ORDER — HYDROCODONE-ACETAMINOPHEN 5-325 MG PO TABS: 0.5000 | ORAL_TABLET | Freq: Four times a day (QID) | ORAL | 0 refills | Status: AC | PRN

## 2016-07-01 MED ORDER — OMEPRAZOLE 20 MG PO CPDR: 20.0000 mg | DELAYED_RELEASE_CAPSULE | Freq: Every day | ORAL | 1 refills | Status: DC

## 2016-07-01 NOTE — Progress Notes (Signed)
[facility] PHYSICAL MEDICINE & REHABILITATION     PROGRESS NOTE  Subjective/Complaints:  Pt seen sitting up at the EOB this AM.  He slept well overnight.  He is ready for discharge, but has questions about medications.   ROS: Denies CP, SOB, N/V/D.  Objective: Vital Signs: Blood pressure 111/79, pulse 96, temperature 98 F (36.7 C), temperature source Oral, resp. rate 16, height 5\' 10"  (1.778 m), weight 101.7 kg (224 lb 3.9 oz), SpO2 99 %. No results found.  Recent Labs  06/30/16 0647 07/01/16 0426  WBC 8.2 8.7  HGB 12.3* 12.0*  HCT 37.1* 35.9*  PLT 180 176    Recent Labs  06/30/16 0647 07/01/16 0426  NA 134* 132*  K 5.1 5.3*  CL 101 98*  GLUCOSE 85 86  BUN 39* 38*  CREATININE 2.63* 2.73*  CALCIUM 9.3 9.1   CBG (last 3)  No results for input(s): GLUCAP in the last 72 hours.  Wt Readings from Last 3 Encounters:  07/01/16 101.7 kg (224 lb 3.9 oz)  06/24/16 97.7 kg (215 lb 6.4 oz)  05/05/16 109.8 kg (242 lb)    Physical Exam:  BP 111/79 (BP Location: Left Arm)   Pulse 96   Temp 98 F (36.7 C) (Oral)   Resp 16   Ht 5\' 10"  (1.778 m)   Wt 101.7 kg (224 lb 3.9 oz)   SpO2 99%   BMI 32.18 kg/m  Constitutional: NAD.  Well-developed. obese HENT: Normocephalic. Atraumatic.  Eyes: Right eye decreased vision, opaque. Ambylopia.  Cardiovascular: RRR. No JVD.  Respiratory: Effort normal and breath sounds normal.  GI: Soft. Bowel sounds are normal.  Musc: No edema, no tenderness. Neurological: He is alertand oriented.  Follow full commands Motor: 4+/5 throughout (stable) Skin: Warm and dry. Intact. Psych: Normal mood and affect.   Assessment/Plan: 1. Functional deficits secondary to debility which require 3+ hours per day of interdisciplinary therapy in a comprehensive inpatient rehab setting. Physiatrist is providing close team supervision and 24 hour management of active medical problems listed below. Physiatrist and rehab team continue to assess barriers  to discharge/monitor patient progress toward functional and medical goals.  Function:  Bathing Bathing position   Position: Shower  Bathing parts Body parts bathed by patient: Right arm, Left arm, Chest, Abdomen, Right upper leg, Left upper leg, Right lower leg, Left lower leg, Front perineal area, Buttocks Body parts bathed by helper: Back  Bathing assist Assist Level: More than reasonable time      Upper Body Dressing/Undressing Upper body dressing   What is the patient wearing?: Pull over shirt/dress     Pull over shirt/dress - Perfomed by patient: Thread/unthread right sleeve, Thread/unthread left sleeve, Put head through opening, Pull shirt over trunk          Upper body assist Assist Level: More than reasonable time   Set up : To obtain clothing/put away  Lower Body Dressing/Undressing Lower body dressing   What is the patient wearing?: Pants, Underwear, Shoes, Non-skid slipper socks Underwear - Performed by patient: Thread/unthread right underwear leg, Thread/unthread left underwear leg, Pull underwear up/down   Pants- Performed by patient: Thread/unthread right pants leg, Thread/unthread left pants leg, Pull pants up/down   Non-skid slipper socks- Performed by patient: Don/doff left sock, Don/doff right sock   Socks - Performed by patient: Don/doff right sock, Don/doff left sock   Shoes - Performed by patient: Don/doff right shoe, Don/doff left shoe, Fasten right, Fasten left  Lower body assist Assist for lower body dressing: More than reasonable time      Toileting Toileting   Toileting steps completed by patient: Adjust clothing prior to toileting, Performs perineal hygiene, Adjust clothing after toileting   Toileting Assistive Devices: Grab bar or rail  Toileting assist Assist level: No help/no cues   Transfers Chair/bed transfer   Chair/bed transfer method: Ambulatory Chair/bed transfer assist level: No Help, no cues, assistive device,  takes more than a reasonable amount of time Chair/bed transfer assistive device: Armrests, Medical sales representative     Max distance: 150 ft Assist level: No help, No cues, assistive device, takes more than a reasonable amount of time   Wheelchair   Type: Manual Max wheelchair distance: 100 ft Assist Level: Supervision or verbal cues  Cognition Comprehension Comprehension assist level: Follows complex conversation/direction with extra time/assistive device  Expression Expression assist level: Expresses complex ideas: With extra time/assistive device  Social Interaction Social Interaction assist level: Interacts appropriately with others - No medications needed.  Problem Solving Problem solving assist level: Solves basic problems with no assist  Memory Memory assist level: More than reasonable amount of time    Medical Problem List and Plan: 1. Debilitationsecondary to CHF exacerbation with sepsis  D/c today  Will see patient in 1-2 weeks for transitional care management 2. DVT Prophylaxis/Anticoagulation: Subcutaneous heparin. Monitor for any bleeding episodes. Patient is ambulatory 3. Pain Management: Hydrocodone as needed 4. Mood: Xanax 0.25-0.5 mg 3 times a day as needed 5. Neuropsych: This patient iscapable of making decisions on hisown behalf. 6. Skin/Wound Care: Routine skin checks 7. Fluids/Electrolytes/Nutrition: Routine I&Os 8.Hypertension. Coreg 3.125 mg twice a day, hydralazine 10 mg 3 times a day, Isordil 5 mg twice a day  Torosemide restarted by HF team, schedule changed, appreciate recs  Controlled 4/26 9.CKD stage III.   Cr 2.73 on 4/27  Cont to monitor 10.CAD-ICD. No chest pain or shortness of breath 11.ID.Streptococcus viridans bacteremia. Completed anti-microbial antibiotic therapy. 12.Hyperlipidemia. Lipitor 13.History of gout. Colchicine 0.6 mg daily. Monitor for any flareup 14. Hyponatremia  Na 132 on 4/27  Cont to monitor 15.  Hyperkalemia  K 5.3 on 4/27  Cont to monitor 16. 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

## 2016-07-01 NOTE — Discharge Summary (Signed)
[name]              ACCOUNT NO.:  [id number]  MEDICAL RECORD NO.:  [id number]  LOCATION:  4W                           FACILITY:  [facility]  PHYSICIAN:  Delice Lesch, MD        DATE OF BIRTH:  08/21/1960  DATE OF ADMISSION:  06/24/2016 DATE OF DISCHARGE:  07/01/2016                              DISCHARGE SUMMARY   DISCHARGE DIAGNOSES: 1. Debilitation secondary to congestive heart failure exacerbation     with sepsis. 2. Subcutaneous heparin for deep vein thrombosis prophylaxis. 3. Anxiety. 4. Hypertension. 5. Chronic kidney disease, stage III. 6. Coronary artery disease - implantable cardioverter defibrillator. 7. Hyperlipidemia. 8. Gout. 9. Hyponatremia. 10.Hyperkalemia, resolved. 11.Acute blood loss anemia.  HISTORY OF PRESENT ILLNESS:  This is a 56 year old right-handed male, history of diastolic congestive heart failure; hemidiaphragm, status post para-ganglioglioma resection; CKD, stage III; CAD with ICD placement.  Lives with spouse as well as a wheelchair-bound teenager. Reported to be independent prior to admission, still working.  Presented on June 06, 2016, with increasing shortness of breath, diaphoresis. Oxygen saturations 83%, troponin negative.  Creatinine 2.40 from baseline 1.71.  Hyperkalemia, 6.4.  Chest x-ray, negative for acute process.  Initially treated for CHF exacerbation, intravenous Lasix, required intubation.  Blood cultures grew strep viridans in 1 of 2 bottles, maintained on broad-spectrum antibiotics, transitioned to Rocephin and antibiotic therapy completed.  Echocardiogram with ejection fraction of 20%, diffuse hypokinesis, no PFO.  TEE; moderate-to-severe mitral regurgitation.  No vegetation.  Renal ultrasound; negative for hydronephrosis with followup per Renal Services.  Maintained on gentle IV fluids and creatinine 1.81.  Remained intubated until June 19, 2016. Subcutaneous heparin for DVT prophylaxis.  Physical and  occupational therapy ongoing.  The patient was admitted for comprehensive rehab program.  PAST MEDICAL HISTORY:  See discharge diagnoses.  SOCIAL HISTORY:  Lives with spouse, independent prior to admission.  FUNCTIONAL STATUS:  Upon admission to [facility] was minimal assist, 50 feet, rolling walker; minimal guard, sit to supine; min-to-mod assist, activities of daily living.  PHYSICAL EXAMINATION:  VITAL SIGNS:  Blood pressure 122/89, pulse 87, temperature 97, respirations 20. GENERAL:  This was an alert male, in no acute distress. HEENT:  EOMs intact. NECK:  Supple.  Nontender.  No JVD.  Decreased vision of the right eye. CARDIAC:  Normal rate and rhythm. ABDOMEN:  Soft, nontender.  Good bowel sounds. LUNGS:  Clear to auscultation without wheeze.  REHABILITATION HOSPITAL COURSE:  The patient was admitted to Inpatient Rehab Services with therapies initiated on a 3-hour daily basis consisting of physical therapy, occupational therapy and rehabilitation nursing.  The following issues were addressed during the patient's rehabilitation stay.  Pertaining to [name], CHF exacerbation remained stable, followed by Heart Failure team.  He exhibited no other signs of fluid overload.  He continued on subcutaneous heparin for DVT prophylaxis.  Blood pressure was controlled on Coreg, hydralazine as well as Isordil.  He completed a course of antibiotic therapy for Streptococcus viridans, remaining afebrile. CKD stage III creatinine slowly trending up 2.73 07/01/2016 with mild hyponatremia 132 he had been started on some low-dose IV fluids per the heart failure team. His torsemide had been held  again per cardiology as well as Coreg. No plan for cardiac catheterization at this time.  History of gout, continued on low-dose colchicine, no gout flare-up noted.  Mild hyponatremia of 133, remained stable, improved to 134 on June 29, 2016.  The patient received weekly collaborative  interdisciplinary team conferences to discuss estimated length of stay, family teaching, any barriers to discharge.  He was ambulating 190 feet, rolling walker, supervision, working with energy conservation techniques, minimal cues for awareness of obstacles, working with standing balance, overall tolerance, stand pivot transfers with distance supervision.  He could gather his belongings for activities of daily living and homemaking, supervision for gathering clothing.  Full family teaching was completed and plan discharge to home.  DISCHARGE MEDICATIONS:  At time of dictation included: 1. Lipitor 40 mg p.o. at bedtime. 2. Coreg 3.125 mg p.o. b.i.d-held for now per cardiology services. 3. Colchicine 0.6 mg p.o. daily. 4. Hydralazine 10 mg p.o. every 8 hours. 5. Isordil 5 mg p.o. b.i.d. 6. Claritin 10 mg p.o. daily. 7. Protonix 40 mg p.o. daily. 8. TobraDex, right eye twice daily. 9. Hydrocodone one tablet p.o. every 6 hours as needed pain. 10. Torsemide 10 mg Monday Wednesday Friday DIET:  His diet was a cardiac diet.  FOLLOWUP:  He would follow up with Dr. Delice Lesch at the Outpatient Rehab Service office as directed; Dr. Haroldine Laws, Cardiology Services, call for appointment; Dr. Donato Heinz, call for appointment; Dr. Hulan Fess, medical management.     Lauraine Rinne, P.A.   ______________________________ Delice Lesch, MD    DA/MEDQ  D:  06/30/2016  T:  07/01/2016  Job:  [id number]  cc:   Lennette Bihari L. Little, M.D. Shaune Pascal. Bensimhon, MD Donato Heinz, M.D.

## 2016-07-01 NOTE — Progress Notes (Signed)
Patient discussed all the discharged instructions with PA before he left the hospital.

## 2016-07-01 NOTE — Progress Notes (Signed)
Social Work  Discharge Note  The overall goal for the admission was met for:   Discharge location: Yes-HOME WITH WIFE AND SON WHO CAN BE THERE WITH HIM  Length of Stay: Yes-7 DAYS  Discharge activity level: Yes-MOD/I LEVEL  Home/community participation: Yes  Services provided included: MD, RD, PT, OT, SLP, RN, CM, Pharmacy and [facility]: Private Insurance: [facility]  Follow-up services arranged: Home Health: [facility], DME: [facility] and Patient/Family has no preference for HH/DME agencies  Comments (or additional information):PT DID WELL AND IS MOD/I [place]. WILL HAVE HH DUE TO ISSUES WITH GETTING TO OP THERAPIES. FM[facility] PAPERS FAXED AND GIVEN BACK TO PT PT PLANNING ON APPLYING FOR DISABILITY  Patient/Family verbalized understanding of follow-up arrangements: Yes  Individual responsible for coordination of the follow-up plan: [facility]  Confirmed correct DME delivered: [name] 07/01/2016    [name]

## 2016-07-01 NOTE — Progress Notes (Signed)
-    Advanced Heart Failure Rounding Note  Primary Cardiologist: Dr. Haroldine Laws   Subjective:    Admitted 06/06/16 with acute respiratory failure requiring intubation secondary to volume overload and septic shock. 1/2 blood cultures positive for strep viridans.   Extubated 06/18/16.   Overall feeling OK. States he can get through therapy with only mild SOB but is fatigued afterwards. OK urine output. Denies lightheadedness or dizziness. No CP. Primary complaint is his diaphragmatic pain that has been chronic x 2 years. Mild bendopnea.  Creatinine again slightly elevated at 2.7 from 2.6 yesterday.  BUN 38. LFTs remains mildly elevated.   Objective:   Weight Range: 224 lb 3.9 oz (101.7 kg) Body mass index is 32.18 kg/m.   Vital Signs:   Temp:  [98 F (36.7 C)-98.7 F (37.1 C)] 98 F (36.7 C) (04/27 0529) Pulse Rate:  [96-104] 96 (04/27 0703) Resp:  [16-18] 16 (04/27 0529) BP: (103-111)/(69-79) 111/79 (04/27 0703) SpO2:  [96 %-99 %] 99 % (04/27 0529) Weight:  [224 lb 3.9 oz (101.7 kg)] 224 lb 3.9 oz (101.7 kg) (04/27 0529) Last BM Date: 06/30/16  Weight change: Filed Weights   06/29/16 0511 06/30/16 0502 07/01/16 0529  Weight: 225 lb 12 oz (102.4 kg) 224 lb (101.6 kg) 224 lb 3.9 oz (101.7 kg)    Intake/Output:   Intake/Output Summary (Last 24 hours) at 07/01/16 0730 Last data filed at 06/30/16 1820  Gross per 24 hour  Intake              600 ml  Output                0 ml  Net              600 ml     Physical Exam: General: Less fatigued appearing this am.  No resp difficulty. HEENT: normal x/ mild R exopthalmus Neck: supple. JVD 5-6. Carotids 2+ bilat; no bruits. No thyromegaly or nodule noted. Cor: PMI nondisplaced. RRR, No M/G/R noted Lungs: CTAB, normal effort. Abdomen: soft, non-tender, distended, no HSM. No bruits or masses. +BS  Extremities: no cyanosis, clubbing, or rash. BLE with trace to 1+ ankle edema.  Neuro: alert & orientedx3, cranial nerves grossly  intact. moves all 4 extremities w/o difficulty. Affect pleasant   Labs: CBC  Recent Labs  06/30/16 0647 07/01/16 0426  WBC 8.2 8.7  NEUTROABS 5.7 5.8  HGB 12.3* 12.0*  HCT 37.1* 35.9*  MCV 88.1 87.8  PLT 180 782   Basic Metabolic Panel  Recent Labs  06/30/16 0647 07/01/16 0426  NA 134* 132*  K 5.1 5.3*  CL 101 98*  CO2 23 24  GLUCOSE 85 86  BUN 39* 38*  CREATININE 2.63* 2.73*  CALCIUM 9.3 9.1   Liver Function Tests  Recent Labs  06/30/16 0647 06/30/16 1522  AST 78* 78*  ALT 175* 181*  ALKPHOS 104 117  BILITOT 0.8 1.1  PROT 6.8 7.4  ALBUMIN 3.0* 3.4*   No results for input(s): LIPASE, AMYLASE in the last 72 hours. Cardiac Enzymes No results for input(s): CKTOTAL, CKMB, CKMBINDEX, TROPONINI in the last 72 hours.  BNP: BNP (last 3 results)  Recent Labs  06/06/16 1645 06/06/16 1925  BNP 796.0* 553.0*    ProBNP (last 3 results) No results for input(s): PROBNP in the last 8760 hours.   D-Dimer No results for input(s): DDIMER in the last 72 hours. Hemoglobin A1C No results for input(s): HGBA1C in the last 72 hours. Fasting Lipid  Panel No results for input(s): CHOL, HDL, LDLCALC, TRIG, CHOLHDL, LDLDIRECT in the last 72 hours. Thyroid Function Tests No results for input(s): TSH, T4TOTAL, T3FREE, THYROIDAB in the last 72 hours.  Invalid input(s): FREET3  Other results:  Imaging/Studies:  No results found.  Medications:     Scheduled Medications: . atorvastatin  40 mg Oral QHS  . colchicine  0.6 mg Oral Daily  . feeding supplement (ENSURE ENLIVE)  237 mL Oral BID BM  . heparin  5,000 Units Subcutaneous Q8H  . hydrALAZINE  10 mg Oral Q8H  . isosorbide dinitrate  5 mg Oral BID  . loratadine  10 mg Oral Daily  . pantoprazole  40 mg Oral Q1200  . tobramycin-dexamethasone   Right Eye BID    Infusions: . sodium chloride      PRN Medications: acetaminophen, ALPRAZolam, guaiFENesin, HYDROcodone-acetaminophen, ondansetron **OR**  ondansetron (ZOFRAN) IV, sodium chloride, sorbitol   Assessment/Plan   1. Acute hypoxic respiratory failure:  - Stable. sats stable on room air.  - May eventually be candidate for diaphragm plication.  2. AKI on CKD III  - Creatinine remains mildly elevated. Holding torsemide for now.  - Low threshold to investigate further for low output HF.  Stabilization a good sign but pt remains fatigued.   3. Chronic systolic CHF: EF 76% on 04/14/29 - Weight unchanged. Volume status looks OK on exam.  - No BB with some concerns for low output.  - Continue hydralazine 10 mg TID.    - Continue isordil 5 mg BID.  - Pt was previously on torsemide 20 mg daily at home but held earlier this week with AKI. Suspect he may need less going forward. Will have him start on torsemide 10 mg every M/W/F and take 10 mg extra as needed.  - No ACE/ARB/ARNI with CKD and Hyper K.  4. Paralzyed right hemidiaphragm.  - Stable. Some chronic discomfort. Off 02 .  - Has asked about diaphragm plication, but would needs to continue to improve before this can be considered.  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 Stable.   6. Septic Shock due to CAP - Initial cultures + 1 of 2 strep viridans. Compelted ABX course. Follow up Cx negative.  - TEE negative for endocarditis or infected lead.  - No change.  7. Deconditioning - Set up for discharge today.  He is scheduled for very close HF follow up next week, 07/07/16, and knows to call sooner with symptoms.  8. Transaminitis - Noted 06/27/16. Remains slightly elevated. If remain elevated or higher in clinic next week will hold statin   Some suspicion for early low output HF with AKI and transaminitis.  Pt feels OK and is focused on getting home and controlled his diaphragmatic pain currently. Feels his fatigued is a little better.  We have scheduled close HF follow up next week and can consider cath again at that time if labs worse or with symptoms.  HF meds  for home Hydralazine 10 mg TID Isordil 5 mg TID Atorvastatin 40 mg daily Torsemide 10 mg M/W/F. Take 10 mg extra as needed for weight gain of 3 lbs overnight or 5 lbs within one week.   Length of Stay: [place], [place]  07/01/2016, 7:30 AM  Advanced Heart Failure Team Pager [phone] (M-F; 7a - 4p)  Please contact [facility] for night-coverage after hours (4p -7a ) and weekends on [email or web address]  Patient seen and examined with the above-signed Advanced  Practice Provider and/or Housestaff. I personally reviewed laboratory data, imaging studies and relevant notes. I independently examined the patient and formulated the important aspects of the plan. I have edited the note to reflect any of my changes or salient points. I have personally discussed the plan with the patient and/or family.  Discussed possibility of RHC but he feels ok and would like to defer. We will follow closely as outpatient. Agree with d/c meds as above.   Glori Bickers, MD  10:55 PM

## 2016-07-01 NOTE — Progress Notes (Signed)
07/01/16 Patient made NPO at midnight

## 2016-07-06 ENCOUNTER — Telehealth: Payer: Self-pay | Admitting: *Deleted

## 2016-07-06 ENCOUNTER — Ambulatory Visit: Payer: 59 | Admitting: Cardiology

## 2016-07-06 NOTE — Telephone Encounter (Signed)
LMOVM TO CALL BACK ABOUT ATTENDING  APPT.  TODAY

## 2016-07-07 ENCOUNTER — Encounter (HOSPITAL_COMMUNITY): Payer: Self-pay

## 2016-07-07 ENCOUNTER — Encounter: Payer: Self-pay | Admitting: Cardiology

## 2016-07-07 ENCOUNTER — Ambulatory Visit (HOSPITAL_COMMUNITY)
Admit: 2016-07-07 | Discharge: 2016-07-07 | Disposition: A | Discharge status: Home / Self Care | Payer: 59 | Attending: Cardiology | Admitting: Cardiology

## 2016-07-07 ENCOUNTER — Ambulatory Visit (INDEPENDENT_AMBULATORY_CARE_PROVIDER_SITE_OTHER): Payer: 59

## 2016-07-07 ENCOUNTER — Telehealth (HOSPITAL_COMMUNITY): Payer: Self-pay | Admitting: Cardiology

## 2016-07-07 ENCOUNTER — Other Ambulatory Visit (HOSPITAL_COMMUNITY): Payer: Self-pay

## 2016-07-07 VITALS — BP 124/76 | HR 128 | Wt 225.0 lb

## 2016-07-07 DIAGNOSIS — E669 Obesity, unspecified: Secondary | ICD-10-CM | POA: Insufficient documentation

## 2016-07-07 DIAGNOSIS — R0600 Dyspnea, unspecified: Secondary | ICD-10-CM

## 2016-07-07 DIAGNOSIS — I5022 Chronic systolic (congestive) heart failure: Secondary | ICD-10-CM | POA: Diagnosis not present

## 2016-07-07 DIAGNOSIS — E875 Hyperkalemia: Secondary | ICD-10-CM | POA: Insufficient documentation

## 2016-07-07 DIAGNOSIS — M109 Gout, unspecified: Secondary | ICD-10-CM | POA: Insufficient documentation

## 2016-07-07 DIAGNOSIS — Z95810 Presence of automatic (implantable) cardiac defibrillator: Secondary | ICD-10-CM | POA: Insufficient documentation

## 2016-07-07 DIAGNOSIS — I130 Hypertensive heart and chronic kidney disease with heart failure and stage 1 through stage 4 chronic kidney disease, or unspecified chronic kidney disease: Secondary | ICD-10-CM | POA: Insufficient documentation

## 2016-07-07 DIAGNOSIS — I5042 Chronic combined systolic (congestive) and diastolic (congestive) heart failure: Secondary | ICD-10-CM | POA: Diagnosis not present

## 2016-07-07 DIAGNOSIS — G4733 Obstructive sleep apnea (adult) (pediatric): Secondary | ICD-10-CM | POA: Diagnosis not present

## 2016-07-07 DIAGNOSIS — N183 Chronic kidney disease, stage 3 (moderate): Secondary | ICD-10-CM | POA: Diagnosis not present

## 2016-07-07 DIAGNOSIS — E039 Hypothyroidism, unspecified: Secondary | ICD-10-CM | POA: Insufficient documentation

## 2016-07-07 DIAGNOSIS — N179 Acute kidney failure, unspecified: Secondary | ICD-10-CM

## 2016-07-07 DIAGNOSIS — D447 Neoplasm of uncertain behavior of aortic body and other paraganglia: Secondary | ICD-10-CM

## 2016-07-07 DIAGNOSIS — Z992 Dependence on renal dialysis: Secondary | ICD-10-CM | POA: Diagnosis not present

## 2016-07-07 DIAGNOSIS — I429 Cardiomyopathy, unspecified: Secondary | ICD-10-CM | POA: Insufficient documentation

## 2016-07-07 DIAGNOSIS — I509 Heart failure, unspecified: Secondary | ICD-10-CM | POA: Insufficient documentation

## 2016-07-07 DIAGNOSIS — J986 Disorders of diaphragm: Secondary | ICD-10-CM | POA: Diagnosis not present

## 2016-07-07 DIAGNOSIS — R42 Dizziness and giddiness: Secondary | ICD-10-CM | POA: Insufficient documentation

## 2016-07-07 DIAGNOSIS — Z9989 Dependence on other enabling machines and devices: Secondary | ICD-10-CM | POA: Diagnosis not present

## 2016-07-07 LAB — BASIC METABOLIC PANEL
Anion gap: 9 (ref 5–15)
BUN: 34 mg/dL — ABNORMAL HIGH (ref 6–20)
CHLORIDE: 103 mmol/L (ref 101–111)
CO2: 21 mmol/L — ABNORMAL LOW (ref 22–32)
Calcium: 9.2 mg/dL (ref 8.9–10.3)
Creatinine, Ser: 2.26 mg/dL — ABNORMAL HIGH (ref 0.61–1.24)
GFR calc non Af Amer: 30 mL/min — ABNORMAL LOW (ref >60)
GFR, EST AFRICAN AMERICAN: 35 mL/min — AB (ref >60)
Glucose, Bld: 117 mg/dL — ABNORMAL HIGH (ref 65–99)
POTASSIUM: 4.7 mmol/L (ref 3.5–5.1)
Sodium: 133 mmol/L — ABNORMAL LOW (ref 135–145)

## 2016-07-07 LAB — CBC
HCT: 39.4 % (ref 39.0–52.0)
HEMOGLOBIN: 13.1 g/dL (ref 13.0–17.0)
MCH: 29.7 pg (ref 26.0–34.0)
MCHC: 33.2 g/dL (ref 30.0–36.0)
MCV: 89.3 fL (ref 78.0–100.0)
Platelets: 133 10*3/uL — ABNORMAL LOW (ref 150–400)
RBC: 4.41 MIL/uL (ref 4.22–5.81)
RDW: 14.9 % (ref 11.5–15.5)
WBC: 7.9 10*3/uL (ref 4.0–10.5)

## 2016-07-07 LAB — PROTIME-INR
INR: 1.14
PROTHROMBIN TIME: 14.7 s (ref 11.4–15.2)

## 2016-07-07 LAB — MAGNESIUM: Magnesium: 1.4 mg/dL — ABNORMAL LOW (ref 1.7–2.4)

## 2016-07-07 LAB — BRAIN NATRIURETIC PEPTIDE: B NATRIURETIC PEPTIDE 5: 932 pg/mL — AB (ref 0.0–100.0)

## 2016-07-07 MED ORDER — HYDRALAZINE HCL 10 MG PO TABS: 10.0000 mg | ORAL_TABLET | Freq: Three times a day (TID) | ORAL | 6 refills | Status: DC

## 2016-07-07 MED ORDER — TORSEMIDE 20 MG PO TABS: ORAL_TABLET | ORAL | 0 refills | Status: DC

## 2016-07-07 MED ORDER — MAGNESIUM 400 MG PO TABS: 400.0000 mg | ORAL_TABLET | Freq: Two times a day (BID) | ORAL | 2 refills | Status: AC

## 2016-07-07 NOTE — Telephone Encounter (Signed)
Pt aware.

## 2016-07-07 NOTE — Progress Notes (Signed)
Patient ID: [name], male   DOB: Sep 18, 1960, 56 y.o.   MRN: [id number]  PCP: Dr Rex Kras  Nephrologist: Dr Marval Regal  Cardiologist: Dr Caryl Comes Urologist: Dr Karsten Ro  HPI: [name] is a 56 y.o. 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   He is intolerant to numerous HF meds including spiro (severe hyperkalemia - requiring HD), hydralazine (fuzzy-headed) and imdur (severe HAs). S/P 12/17/12 bladder cancer removal.   11/29/11 ECHO EF 15% Diffuse hypokinesis. Features are consistent with a pseudonormal left ventricular filling pattern, with concomitant abnormal relaxation and increased filling pressure (grade 2 diastolic dysfunction).   Admitted to [facility] 01/26/2012 after his ICD fired 5 times Potassium >7.5 Required urgent dialysis. Discharged form [facility] 01/29/12. Discharge weight 250 pounds.   CPX 07/12/13 FVC 1.55 (37%)  FEV1 1.19 (36%)  FEV1/FVC 77%  MVV 53 (35%) Exercise Time: 3:30 Speed (mph): 1.5 Grade (%): 0  RPE: 15 Resting HR: 109 Peak HR: 141 (84% age predicted max HR) BP rest: 130/90 BP peak: 142/96 Peak VO2: 9.0 (34.7% predicted peak VO2)  VE/VCO2 slope: 32.0 OUES: 1.90 Peak RER: 0.90 Ventilatory Threshold: Could not be detected VE/MVV: 58.5% PETCO2 at peak: 33 O2pulse: 8 (44% predicted O2pulse)  ECHO 02/25/13 EF <15%   He is S/P 02/13/2014 RVATS and R thoracotomy  With resection of paraganglioma. Required milrinone support post-operatively.   Admitted 06/06/2016 with acute resp failure requiring intubation 2/2 to volume overload and septic shock. BCx 1/2 + for strep viridans. No endocarditis or vegetation noted on TEE 06/15/16. Extubated 06/18/16. 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 Diuresed from 245  to 224 on discharge. Spent nearly a week in CIR prior to d/c. Discharge weight was 224 lbs.   ECHO 06/09/2016 EF 20%, severe LV dilation, Mild AI, Mod MR, Mod LAE.   He presents today for post hospital follow up. Still feels very tired. SOB with minimal exertion. Sits down in the shower. Pretty fatigued afterward. Denies fever, chills, or diarrhea.  Has been having nausea and dry heaves. Taking prilosec for GERD. Thinks it helps a little. Does have mild orthopnea but seems positional due to his R diaphragm paraplegia. Awaiting new BiPAP mask, needs new mask. Saw pulmonologist last week and they are arranging. Appetite still poor. Eating what he can. Couldn't finish his breakfast this am. Watching fluids as well.   Optivol: Fluid index below threshold but trending up. Thoracic impedence depressed. Occasional SVT. ? AT/AF, but no more than a few minutes most days. PT activity negligent, barely diverges from baseline.   PFTs 05/2016 FVC 1.22 (32%) FEV1 0.95 (32%) TLC 3.58 (53%) DLCO 11.8 (42%)  Labs  12/12/12 dig level 0.9 12/18/12 Magnesium 1.1 K 4.0 Creatinine 1.5 01/02/13: Uric acid 12.9, K+ 3.9, Cr 1.37, AST 21, ALT 19, Mag 1.3 01/29/13 K 4.1 Creatinine 1.5 Magnesium 1.5 03/21/13 Magnesium 1.7 TSH 1.11--> synthorid increased to 100 mcg.  7/15: K 4, creatinine 1.35 8/15: digoxin 0.5 12/2013: K+ 4.4, creatinine 1.39 7/16 K 4.9, creatinine 1.58  Review of systems complete and found to be negative unless listed in HPI.    Past Medical History:  Diagnosis Date  . Arthritis    GOUT  . Automatic implantable cardioverter-defibrillator in situ   . Biventricular ICD (implantable cardiac defibrillator) Medtronic ]  DOI 2008/ upgrade 2010/ Gen Change 2014  . Bladder tumor    PT HOSP AT [facility] 4/24 TO 4/26 2014 WITH UTI--AND FOUND TO HAVE BLADDER TUMOR-  . Cardiomyopathy    Idiopathic dilated;   . CHF (congestive heart failure) ([place])   . CKD (chronic kidney disease)    stage III baseline Crt  1.8-2.0  . Coronary artery disease   . Gout 08/03/12   PT C/O OF RIGHT KNEE PAIN AND SWELLING -STATES GOUT FLARE UP - ONGOING SINCE APRIL - BUT SWELLING W/IN LAST WEEK  . Hilar mass    Noted CT 2010  . HTN (hypertension)    Dr. Caryl Comes cardiologist  . Hypothyroidism   . 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 Sleep apnea    CPAP  . Systolic CHF ([place])    EF 60%  . Ventricular tachycardia [facility])    s/p ICD    Current Outpatient Prescriptions  Medication Sig Dispense Refill  . ALPRAZolam (XANAX) 0.25 MG tablet Take 1-2 tablets (0.25-0.5 mg total) by mouth 3 (three) times daily as needed for anxiety. 20 tablet 0  . atorvastatin (LIPITOR) 40 MG tablet Take 1 tablet (40 mg total) by mouth at bedtime. 30 tablet 2  . BREO ELLIPTA 100-25 MCG/INH AEPB Inhale 1 puff into the lungs daily as needed (for shortness of breath).   11  . colchicine 0.6 MG tablet Take 1 tablet (0.6 mg total) by mouth daily. 30 tablet 0  . febuxostat (ULORIC) 40 MG tablet Take 40 mg by mouth daily.    . hydrALAZINE (APRESOLINE) 10 MG tablet Take 10 mg by mouth every morning.    Marland Kitchen HYDROcodone-acetaminophen (NORCO/VICODIN) 5-325 MG tablet Take 0.5-1 tablets by mouth every 6 (six) hours as needed for moderate pain. 20 tablet 0  . loratadine (CLARITIN) 10 MG tablet Take 1 tablet (10 mg total) by mouth daily. 30 tablet 0  . omeprazole (PRILOSEC) 40 MG capsule Take 40 mg by mouth daily.    Marland Kitchen tobramycin-dexamethasone (TOBRADEX) ophthalmic solution Place 1 drop into the right eye 2 (two) times daily.    Marland Kitchen torsemide (DEMADEX) 20 MG tablet One half tablet Monday Wednesdays and Fridays 30 tablet 0   No current facility-administered medications for this encounter.      Vitals:   07/07/16 0841  BP: 124/76  Pulse: (!) 128  SpO2: 99%  Weight: 225 lb (102.1 kg)   Wt Readings from Last 3 Encounters:  07/07/16 225 lb (102.1 kg)  07/01/16 224 lb 3.9 oz (101.7 kg)  06/24/16 215 lb  6.4 oz (97.7 kg)    PHYSICAL EXAM: General: Fatigued appearing.  HEENT: Normal except for R exopthalmus.  Neck: Supple. JVD 7-8 cm. 2+ bilat; no bruits. No thyromegaly or nodule noted. R neck scar Cor: PMI nondisplaced. RRR, No M/G/R noted + 3. Lungs: Diminished basilar sounds. R base nearly absent  Abdomen: soft, non-tender, mildly distended, no HSM. No bruits or masses. +BS  Extremities: no cyanosis, clubbing, rash, Trace to 1+ ankle edema bilaterally.  Neuro: Alert & oriented x 3. Cranial nerves grossly intact. Moves all 4 extremities w/o difficulty. Affect pleasant    ASSESSMENT & PLAN:  1. Chronic sysotolic CHF.  - Echo 09/06/7104 with EF 20%. 15% by TEE 06/15/16.  - Functional status prior to d/c concerning for low output. NYHA Class IIIb today. Remains tachycardic and with S3 on exam. Will schedule for RHC next week. Pre-cath labs today.  -  Volume status looks stable from discharge on exam, though mildly elevated by optivol.  - Continue torsemide 10 mg every M/W/F with extra 10 mg as needed for weight gain. Will adjust further after RHC.  - Increase hydralazine 10 mg too TID. Has only been taking daily. Has previously had non specific "fuzzy-headed" symptoms on this, so may limit up-titration.  - Intolerant to isordil with severe headaches.  - Reinforced fluid restriction to < 2 L daily, sodium restriction to less than 2000 mg daily, and the importance of daily weights.     2. AKI on CKD III  - Remained elevated but baseline on d/c.  BMET today.  3. Paralzyed right hemidiaphragm.  - Stable. Some chronic discomfort. Off 02 .  - May eventually be candidate for diaphragm plication.   but would needs to continue to improve before this can be considered.  4. 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 No change.  5. Septic Shock due to CAP admission 06/2016 - Initial cultures + 1 of 2 strep viridans.  - Completed ABX. TEE negative. Follow up cultures negative.  6. OSA  on BiPAP - Awaiting new mask.   Unclear component of his deconditioning combined with possible low output. Will plan for RHC with Dr. Haroldine Laws next week. Pre cath labs today and meds as above. RTC 2 weeks if RHC stable. Likely admit if low output for inotrope needed.   Shirley Friar, PA-C  07/07/2016   Greater than 50% of the 25 minute visit was spent in counseling/coordination of care regarding disease state education, medication reconciliation, review of lab results, and sliding scale diuretics.

## 2016-07-07 NOTE — Telephone Encounter (Signed)
-----   Message from Shirley Friar, PA-C sent at 07/07/2016 10:24 AM EDT ----- Please have him restart Magnesium at 400 mg BID.   Please have him take extra dose of torsemide x 1 (10 mg)   Legrand Como [address] [place], [place] 07/07/2016 10:23 AM

## 2016-07-07 NOTE — Patient Instructions (Signed)
INCREASE Hydralazine to 10 mg (1 tab) three times daily (once every 8 hours).  Continue taking Torsemide 10 mg (1/2 tablet) once on Monday Wednesday Fridays. Can take extra 1/2 tablet on days you have weight gain/swelling.  Routine lab work today. Will notify you of abnormal results, otherwise no news is good news!  EKG today: Sinus tachycardia.  You have been scheduled for a heart catheterization. Please see instruction sheet for additional details.  Follow up 2 weeks with Jamie Kilts PA-C.  Do the following things EVERYDAY: 1) Weigh yourself in the morning before breakfast. Write it down and keep it in a log. 2) Take your medicines as prescribed 3) Eat low salt foods-Limit salt (sodium) to 2000 mg per day.  4) Stay as active as you can everyday 5) Limit all fluids for the day to less than 2 liters

## 2016-07-08 ENCOUNTER — Other Ambulatory Visit (HOSPITAL_COMMUNITY): Payer: Self-pay

## 2016-07-08 ENCOUNTER — Telehealth: Payer: Self-pay | Admitting: *Deleted

## 2016-07-08 MED ORDER — HYDRALAZINE HCL 10 MG PO TABS: 10.0000 mg | ORAL_TABLET | Freq: Three times a day (TID) | ORAL | 6 refills | Status: DC

## 2016-07-08 NOTE — Progress Notes (Signed)
EPIC Encounter for ICM Monitoring  Patient Name: [name] is a 56 y.o. male Date: 07/08/2016 Primary Care Physican: Gennette Pac, MD Primary Cardiologist:Bensimhon Electrophysiologist: Faustino Congress Weight:225lbs (with clothes and shoes) Bi-V Pacing: 96.4%      Heart Failure questions reviewed, pt asymptomatic for fluid but is very weak from recent hospitalization.  Patient hospitalized 06/06/2016 to 06/30/2016.  Heart cath scheduled for 07/13/2016    Thoracic impedance abnormal suggesting fluid accumulation which was addressed at HF office visit on 07/07/2016.  Prescribed dosage: Torsemide 10 mg 1 tablet on Mondays, Wednesdays and Fridays.  Can take extra 1/2 tablet as needed for weight gain/swelling  Labs: 07/07/2016 Creatinine 2.26, BUN 34, Potassium 4.7, Sodium 133, EGFR 30-35 07/01/2016 Creatinine 2.73, BUN 38, Potassium 5.3, Sodium 132, EGFR 24-28  06/30/2016 Creatinine 2.63, BUN 39, Potassium 5.1, Sodium 134, EGFR 25-29  06/29/2016 Creatinine 2.64, BUN 38, Potassium 5.1, Sodium 134, EGFR 25-29   Recommendations:  Currently not weighing at home and explained importance of weighing daily.  Advised to weigh daily to monitor for fluid weight by weighing everyday after getting OOB, emptying bladder and wearing the same clothes for consistency.  Reminded him of the importance of limiting salt intake.  Encouraged to call for fluid symptoms or use local ER for any urgent symptoms.  Follow-up plan: ICM clinic phone appointment on 07/26/2016.  Office appointment scheduled on 07/21/2016 with HF clinic.  Copy of ICM check sent to primary cardiologist and device physician.   3 month ICM trend: 07/07/2016   1 Year ICM trend:      Rosalene Billings, RN 07/08/2016 9:49 AM

## 2016-07-08 NOTE — Telephone Encounter (Signed)
Patient called and stated that the rx for hydralazine was supposed to be sent to [facility] as he does not use the mail order pharmacy. Thanks, MI

## 2016-07-11 MED ORDER — HYDRALAZINE HCL 10 MG PO TABS: 10.0000 mg | ORAL_TABLET | Freq: Three times a day (TID) | ORAL | 6 refills | Status: DC

## 2016-07-11 NOTE — Telephone Encounter (Signed)
Script sent  

## 2016-07-11 NOTE — Addendum Note (Signed)
Addended by: JEFFRIES, Sharlot Gowda on: 07/11/2016 05:01 PM   Modules accepted: Orders

## 2016-07-12 ENCOUNTER — Telehealth (HOSPITAL_COMMUNITY): Payer: Self-pay | Admitting: *Deleted

## 2016-07-12 NOTE — Telephone Encounter (Signed)
No pre cert reqd for RHC

## 2016-07-13 ENCOUNTER — Inpatient Hospital Stay (HOSPITAL_COMMUNITY)
Admission: RE | Admit: 2016-07-13 | Discharge: 2016-07-19 | DRG: 286 | Disposition: A | Discharge status: Home Health | Payer: 59 | Source: Ambulatory Visit | Attending: Internal Medicine | Admitting: Internal Medicine

## 2016-07-13 ENCOUNTER — Encounter (HOSPITAL_COMMUNITY)
Admission: RE | Disposition: A | Discharge status: Home Health | Payer: Self-pay | Source: Ambulatory Visit | Attending: Internal Medicine

## 2016-07-13 ENCOUNTER — Encounter: Payer: 59 | Admitting: Physical Medicine & Rehabilitation

## 2016-07-13 ENCOUNTER — Encounter (HOSPITAL_COMMUNITY): Payer: Self-pay | Admitting: Cardiology

## 2016-07-13 DIAGNOSIS — E039 Hypothyroidism, unspecified: Secondary | ICD-10-CM | POA: Diagnosis present

## 2016-07-13 DIAGNOSIS — I5043 Acute on chronic combined systolic (congestive) and diastolic (congestive) heart failure: Secondary | ICD-10-CM | POA: Diagnosis not present

## 2016-07-13 DIAGNOSIS — N179 Acute kidney failure, unspecified: Secondary | ICD-10-CM

## 2016-07-13 DIAGNOSIS — E669 Obesity, unspecified: Secondary | ICD-10-CM | POA: Diagnosis present

## 2016-07-13 DIAGNOSIS — I472 Ventricular tachycardia: Secondary | ICD-10-CM | POA: Diagnosis present

## 2016-07-13 DIAGNOSIS — M109 Gout, unspecified: Secondary | ICD-10-CM | POA: Diagnosis present

## 2016-07-13 DIAGNOSIS — I5023 Acute on chronic systolic (congestive) heart failure: Secondary | ICD-10-CM | POA: Diagnosis present

## 2016-07-13 DIAGNOSIS — I5084 End stage heart failure: Secondary | ICD-10-CM | POA: Diagnosis present

## 2016-07-13 DIAGNOSIS — I5082 Biventricular heart failure: Secondary | ICD-10-CM | POA: Diagnosis present

## 2016-07-13 DIAGNOSIS — N184 Chronic kidney disease, stage 4 (severe): Secondary | ICD-10-CM | POA: Diagnosis present

## 2016-07-13 DIAGNOSIS — Z79899 Other long term (current) drug therapy: Secondary | ICD-10-CM

## 2016-07-13 DIAGNOSIS — K219 Gastro-esophageal reflux disease without esophagitis: Secondary | ICD-10-CM | POA: Diagnosis present

## 2016-07-13 DIAGNOSIS — Z6830 Body mass index (BMI) 30.0-30.9, adult: Secondary | ICD-10-CM | POA: Diagnosis not present

## 2016-07-13 DIAGNOSIS — I130 Hypertensive heart and chronic kidney disease with heart failure and stage 1 through stage 4 chronic kidney disease, or unspecified chronic kidney disease: Secondary | ICD-10-CM | POA: Diagnosis present

## 2016-07-13 DIAGNOSIS — C679 Malignant neoplasm of bladder, unspecified: Secondary | ICD-10-CM | POA: Diagnosis present

## 2016-07-13 DIAGNOSIS — I2510 Atherosclerotic heart disease of native coronary artery without angina pectoris: Secondary | ICD-10-CM | POA: Diagnosis present

## 2016-07-13 DIAGNOSIS — R0600 Dyspnea, unspecified: Secondary | ICD-10-CM

## 2016-07-13 DIAGNOSIS — J9600 Acute respiratory failure, unspecified whether with hypoxia or hypercapnia: Secondary | ICD-10-CM | POA: Diagnosis present

## 2016-07-13 DIAGNOSIS — R627 Adult failure to thrive: Secondary | ICD-10-CM | POA: Diagnosis present

## 2016-07-13 DIAGNOSIS — Z95810 Presence of automatic (implantable) cardiac defibrillator: Secondary | ICD-10-CM | POA: Diagnosis not present

## 2016-07-13 DIAGNOSIS — I428 Other cardiomyopathies: Secondary | ICD-10-CM | POA: Diagnosis present

## 2016-07-13 DIAGNOSIS — J986 Disorders of diaphragm: Secondary | ICD-10-CM | POA: Diagnosis present

## 2016-07-13 DIAGNOSIS — R0602 Shortness of breath: Secondary | ICD-10-CM | POA: Diagnosis present

## 2016-07-13 DIAGNOSIS — R570 Cardiogenic shock: Secondary | ICD-10-CM | POA: Diagnosis present

## 2016-07-13 DIAGNOSIS — G4733 Obstructive sleep apnea (adult) (pediatric): Secondary | ICD-10-CM | POA: Diagnosis present

## 2016-07-13 HISTORY — PX: RIGHT HEART CATH: CATH118263

## 2016-07-13 LAB — CBC
HEMATOCRIT: 38.4 % — AB (ref 39.0–52.0)
Hemoglobin: 12.8 g/dL — ABNORMAL LOW (ref 13.0–17.0)
MCH: 29.8 pg (ref 26.0–34.0)
MCHC: 33.3 g/dL (ref 30.0–36.0)
MCV: 89.3 fL (ref 78.0–100.0)
Platelets: 141 10*3/uL — ABNORMAL LOW (ref 150–400)
RBC: 4.3 MIL/uL (ref 4.22–5.81)
RDW: 14.9 % (ref 11.5–15.5)
WBC: 5.4 10*3/uL (ref 4.0–10.5)

## 2016-07-13 LAB — COOXEMETRY PANEL
CARBOXYHEMOGLOBIN: 1.7 % — AB (ref 0.5–1.5)
METHEMOGLOBIN: 0.8 % (ref 0.0–1.5)
O2 Saturation: 64.7 %
Total hemoglobin: 13.1 g/dL (ref 12.0–16.0)

## 2016-07-13 LAB — POCT I-STAT 3, VENOUS BLOOD GAS (G3P V)
ACID-BASE DEFICIT: 1 mmol/L (ref 0.0–2.0)
ACID-BASE DEFICIT: 1 mmol/L (ref 0.0–2.0)
Bicarbonate: 25.4 mmol/L (ref 20.0–28.0)
Bicarbonate: 25.6 mmol/L (ref 20.0–28.0)
O2 SAT: 39 %
O2 SAT: 41 %
PCO2 VEN: 50.2 mmHg (ref 44.0–60.0)
PH VEN: 7.318 (ref 7.250–7.430)
PO2 VEN: 25 mmHg — AB (ref 32.0–45.0)
TCO2: 27 mmol/L (ref 0–100)
TCO2: 27 mmol/L (ref 0–100)
pCO2, Ven: 49.6 mmHg (ref 44.0–60.0)
pH, Ven: 7.315 (ref 7.250–7.430)
pO2, Ven: 26 mmHg — CL (ref 32.0–45.0)

## 2016-07-13 LAB — COMPREHENSIVE METABOLIC PANEL
ALT: 33 U/L (ref 17–63)
ANION GAP: 12 (ref 5–15)
AST: 23 U/L (ref 15–41)
Albumin: 3.7 g/dL (ref 3.5–5.0)
Alkaline Phosphatase: 85 U/L (ref 38–126)
BILIRUBIN TOTAL: 1.6 mg/dL — AB (ref 0.3–1.2)
BUN: 52 mg/dL — ABNORMAL HIGH (ref 6–20)
CO2: 24 mmol/L (ref 22–32)
Calcium: 9.8 mg/dL (ref 8.9–10.3)
Chloride: 98 mmol/L — ABNORMAL LOW (ref 101–111)
Creatinine, Ser: 3.53 mg/dL — ABNORMAL HIGH (ref 0.61–1.24)
GFR, EST AFRICAN AMERICAN: 21 mL/min — AB (ref >60)
GFR, EST NON AFRICAN AMERICAN: 18 mL/min — AB (ref >60)
Glucose, Bld: 98 mg/dL (ref 65–99)
POTASSIUM: 5.5 mmol/L — AB (ref 3.5–5.1)
Sodium: 134 mmol/L — ABNORMAL LOW (ref 135–145)
TOTAL PROTEIN: 7.3 g/dL (ref 6.5–8.1)

## 2016-07-13 LAB — GLUCOSE, CAPILLARY: GLUCOSE-CAPILLARY: 113 mg/dL — AB (ref 65–99)

## 2016-07-13 LAB — MRSA PCR SCREENING: MRSA BY PCR: POSITIVE — AB

## 2016-07-13 LAB — MAGNESIUM: Magnesium: 2.2 mg/dL (ref 1.7–2.4)

## 2016-07-13 LAB — TSH: TSH: 2.212 u[IU]/mL (ref 0.350–4.500)

## 2016-07-13 SURGERY — RIGHT HEART CATH
Anesthesia: LOCAL

## 2016-07-13 MED ORDER — HEPARIN (PORCINE) IN NACL 2-0.9 UNIT/ML-% IJ SOLN
INTRAMUSCULAR | Status: AC
  Filled 2016-07-13: qty 500

## 2016-07-13 MED ORDER — ACETAMINOPHEN 325 MG PO TABS: 650.0000 mg | ORAL_TABLET | ORAL | Status: DC | PRN

## 2016-07-13 MED ORDER — SODIUM CHLORIDE 0.9% FLUSH
3.0000 mL | Freq: Two times a day (BID) | INTRAVENOUS | Status: DC
  Administered 2016-07-13: 3 mL via INTRAVENOUS

## 2016-07-13 MED ORDER — SODIUM CHLORIDE 0.9 % IV SOLN
INTRAVENOUS | Status: DC
  Administered 2016-07-13 – 2016-07-14 (×2): via INTRAVENOUS

## 2016-07-13 MED ORDER — LORATADINE 10 MG PO TABS
10.0000 mg | ORAL_TABLET | Freq: Every day | ORAL | Status: DC
  Administered 2016-07-13 – 2016-07-19 (×7): 10 mg via ORAL
  Filled 2016-07-13 (×7): qty 1

## 2016-07-13 MED ORDER — ENOXAPARIN SODIUM 40 MG/0.4ML ~~LOC~~ SOLN
40.0000 mg | SUBCUTANEOUS | Status: DC
  Administered 2016-07-13: 40 mg via SUBCUTANEOUS
  Filled 2016-07-13: qty 0.4

## 2016-07-13 MED ORDER — CHLORHEXIDINE GLUCONATE CLOTH 2 % EX PADS
6.0000 | MEDICATED_PAD | Freq: Every day | CUTANEOUS | Status: DC
  Administered 2016-07-14 – 2016-07-15 (×2): 6 via TOPICAL

## 2016-07-13 MED ORDER — LIDOCAINE HCL (PF) 1 % IJ SOLN
INTRAMUSCULAR | Status: DC | PRN
  Administered 2016-07-13: 2 mL

## 2016-07-13 MED ORDER — FUROSEMIDE 10 MG/ML IJ SOLN
INTRAMUSCULAR | Status: AC
  Administered 2016-07-13: 80 mg via INTRAVENOUS
  Filled 2016-07-13: qty 8

## 2016-07-13 MED ORDER — SODIUM CHLORIDE 0.9% FLUSH: 10.0000 mL | INTRAVENOUS | Status: DC | PRN

## 2016-07-13 MED ORDER — MAGNESIUM OXIDE 400 (241.3 MG) MG PO TABS
400.0000 mg | ORAL_TABLET | Freq: Two times a day (BID) | ORAL | Status: DC
  Administered 2016-07-13 – 2016-07-19 (×12): 400 mg via ORAL
  Filled 2016-07-13 (×12): qty 1

## 2016-07-13 MED ORDER — MILRINONE LACTATE IN DEXTROSE 20-5 MG/100ML-% IV SOLN
0.1250 ug/kg/min | INTRAVENOUS | Status: DC
  Administered 2016-07-13: 0.375 ug/kg/min via INTRAVENOUS
  Administered 2016-07-13: 0.25 ug/kg/min via INTRAVENOUS
  Administered 2016-07-14: 0.375 ug/kg/min via INTRAVENOUS
  Administered 2016-07-15 – 2016-07-18 (×4): 0.125 ug/kg/min via INTRAVENOUS
  Filled 2016-07-13 (×8): qty 100

## 2016-07-13 MED ORDER — SODIUM CHLORIDE 0.9% FLUSH: 3.0000 mL | INTRAVENOUS | Status: DC | PRN

## 2016-07-13 MED ORDER — ADENOSINE 6 MG/2ML IV SOLN
INTRAVENOUS | Status: AC
  Filled 2016-07-13: qty 2

## 2016-07-13 MED ORDER — SODIUM CHLORIDE 0.9 % IV SOLN: 250.0000 mL | INTRAVENOUS | Status: DC | PRN

## 2016-07-13 MED ORDER — ONDANSETRON HCL 4 MG/2ML IJ SOLN: 4.0000 mg | Freq: Four times a day (QID) | INTRAMUSCULAR | Status: DC | PRN

## 2016-07-13 MED ORDER — SODIUM CHLORIDE 0.9% FLUSH: 3.0000 mL | Freq: Two times a day (BID) | INTRAVENOUS | Status: DC

## 2016-07-13 MED ORDER — ADENOSINE 6 MG/2ML IV SOLN
INTRAVENOUS | Status: DC | PRN
  Administered 2016-07-13: 6 mg via INTRAVENOUS

## 2016-07-13 MED ORDER — FUROSEMIDE 10 MG/ML IJ SOLN
120.0000 mg | Freq: Two times a day (BID) | INTRAVENOUS | Status: DC
  Administered 2016-07-13 – 2016-07-14 (×2): 120 mg via INTRAVENOUS
  Filled 2016-07-13 (×4): qty 12

## 2016-07-13 MED ORDER — ASPIRIN 81 MG PO CHEW
81.0000 mg | CHEWABLE_TABLET | ORAL | Status: AC
  Administered 2016-07-13: 81 mg via ORAL

## 2016-07-13 MED ORDER — MAGNESIUM 200 MG PO TABS
400.0000 mg | ORAL_TABLET | Freq: Two times a day (BID) | ORAL | Status: DC
  Administered 2016-07-13: 400 mg via ORAL
  Filled 2016-07-13: qty 2
  Filled 2016-07-13: qty 1
  Filled 2016-07-13: qty 2

## 2016-07-13 MED ORDER — ONDANSETRON HCL 4 MG/2ML IJ SOLN
4.0000 mg | Freq: Four times a day (QID) | INTRAMUSCULAR | Status: DC | PRN
  Administered 2016-07-16: 4 mg via INTRAVENOUS
  Filled 2016-07-13: qty 2

## 2016-07-13 MED ORDER — MUPIROCIN 2 % EX OINT
1.0000 "application " | TOPICAL_OINTMENT | Freq: Two times a day (BID) | CUTANEOUS | Status: AC
  Administered 2016-07-13 – 2016-07-17 (×10): 1 via NASAL
  Filled 2016-07-13 (×4): qty 22

## 2016-07-13 MED ORDER — HYDROCODONE-ACETAMINOPHEN 5-325 MG PO TABS
0.5000 | ORAL_TABLET | Freq: Four times a day (QID) | ORAL | Status: DC | PRN
Start: 2016-07-13 — End: 2016-07-19
  Administered 2016-07-18: 0.5 via ORAL
  Filled 2016-07-13: qty 1

## 2016-07-13 MED ORDER — ATORVASTATIN CALCIUM 40 MG PO TABS
40.0000 mg | ORAL_TABLET | Freq: Every day | ORAL | Status: DC
  Administered 2016-07-13 – 2016-07-18 (×6): 40 mg via ORAL
  Filled 2016-07-13 (×6): qty 1

## 2016-07-13 MED ORDER — FEBUXOSTAT 40 MG PO TABS
40.0000 mg | ORAL_TABLET | Freq: Every day | ORAL | Status: DC
  Administered 2016-07-13 – 2016-07-19 (×7): 40 mg via ORAL
  Filled 2016-07-13 (×7): qty 1

## 2016-07-13 MED ORDER — CHLORHEXIDINE GLUCONATE CLOTH 2 % EX PADS
6.0000 | MEDICATED_PAD | Freq: Every day | CUTANEOUS | Status: AC
  Administered 2016-07-15 – 2016-07-18 (×3): 6 via TOPICAL

## 2016-07-13 MED ORDER — HEPARIN (PORCINE) IN NACL 2-0.9 UNIT/ML-% IJ SOLN
INTRAMUSCULAR | Status: DC | PRN
Start: 2016-07-13 — End: 2016-07-13
  Administered 2016-07-13: 500 mL

## 2016-07-13 MED ORDER — FLUTICASONE FUROATE-VILANTEROL 100-25 MCG/INH IN AEPB: 1.0000 | INHALATION_SPRAY | Freq: Every day | RESPIRATORY_TRACT | Status: DC | PRN

## 2016-07-13 MED ORDER — ALPRAZOLAM 0.25 MG PO TABS: 0.2500 mg | ORAL_TABLET | Freq: Three times a day (TID) | ORAL | Status: DC | PRN

## 2016-07-13 MED ORDER — SODIUM CHLORIDE 0.9% FLUSH
10.0000 mL | Freq: Two times a day (BID) | INTRAVENOUS | Status: DC
  Administered 2016-07-14 – 2016-07-17 (×7): 10 mL

## 2016-07-13 MED ORDER — SODIUM CHLORIDE 0.9 % IV SOLN
INTRAVENOUS | Status: DC | PRN
  Administered 2016-07-13: 10 mL/h via INTRAVENOUS

## 2016-07-13 MED ORDER — ASPIRIN 81 MG PO CHEW
CHEWABLE_TABLET | ORAL | Status: AC
  Administered 2016-07-13: 81 mg via ORAL
  Filled 2016-07-13: qty 1

## 2016-07-13 MED ORDER — ENOXAPARIN SODIUM 30 MG/0.3ML ~~LOC~~ SOLN
30.0000 mg | SUBCUTANEOUS | Status: DC
  Administered 2016-07-14 – 2016-07-19 (×6): 30 mg via SUBCUTANEOUS
  Filled 2016-07-13 (×6): qty 0.3

## 2016-07-13 MED ORDER — FUROSEMIDE 10 MG/ML IJ SOLN
80.0000 mg | Freq: Once | INTRAMUSCULAR | Status: AC
  Administered 2016-07-13: 80 mg via INTRAVENOUS

## 2016-07-13 MED ORDER — LIDOCAINE HCL 1 % IJ SOLN
INTRAMUSCULAR | Status: AC
  Filled 2016-07-13: qty 20

## 2016-07-13 MED ORDER — SODIUM CHLORIDE 0.9 % IV SOLN
250.0000 mL | INTRAVENOUS | Status: DC | PRN
  Administered 2016-07-13: 1000 mL via INTRAVENOUS

## 2016-07-13 MED ORDER — TOBRAMYCIN-DEXAMETHASONE 0.3-0.1 % OP SUSP
1.0000 [drp] | Freq: Two times a day (BID) | OPHTHALMIC | Status: DC
  Administered 2016-07-13 – 2016-07-19 (×13): 1 [drp] via OPHTHALMIC
  Filled 2016-07-13: qty 2.5

## 2016-07-13 MED ORDER — PANTOPRAZOLE SODIUM 40 MG PO TBEC
40.0000 mg | DELAYED_RELEASE_TABLET | Freq: Every day | ORAL | Status: DC
  Administered 2016-07-13 – 2016-07-19 (×7): 40 mg via ORAL
  Filled 2016-07-13 (×7): qty 1

## 2016-07-13 SURGICAL SUPPLY — 9 items
CATH BALLN WEDGE 5F 110CM (CATHETERS) ×2 IMPLANT
KIT HEART RIGHT NAMIC (KITS) ×2 IMPLANT
PACK CARDIAC CATHETERIZATION (CUSTOM PROCEDURE TRAY) ×2 IMPLANT
PROTECTION STATION PRESSURIZED (MISCELLANEOUS) ×2
SHEATH GLIDE SLENDER 4/5FR (SHEATH) ×2 IMPLANT
SLEEVE REPOSITIONING LENGTH 30 (MISCELLANEOUS) ×2 IMPLANT
STATION PROTECTION PRESSURIZED (MISCELLANEOUS) ×1 IMPLANT
TRANSDUCER W/STOPCOCK (MISCELLANEOUS) ×2 IMPLANT
WIRE EMERALD 3MM-J .025X260CM (WIRE) ×2 IMPLANT

## 2016-07-13 NOTE — Progress Notes (Addendum)
  Paged to some see patient with continue tachycardia and tachypnea.  Affect depressed on my arrival.  Sleepy, though rouses easily.  States breathing is "heavy".    Will get CXR and give 80 mg IV lasix NOW. Will check coox now. Has been on milrinone for ~ 2 hrs. Will add ammonia to labs. No clear asterixis on exam. LFTS not elevated but Creatinine well above baseline.  Likely cardiorenal.    Lollie Marrow, [place] 07/13/2016 1:58 PM

## 2016-07-13 NOTE — Interval H&P Note (Signed)
History and Physical Interval Note:  07/13/2016 9:49 AM  [name]  has presented today for surgery, with the diagnosis of hf  The various methods of treatment have been discussed with the patient and family. After consideration of risks, benefits and other options for treatment, the patient has consented to  Procedure(s): Right Heart Cath (N/A) as a surgical intervention .  The patient's history has been reviewed, patient examined, no change in status, stable for surgery.  I have reviewed the patient's chart and labs.  Questions were answered to the patient's satisfaction.     Farryn Linares, Quillian Quince

## 2016-07-13 NOTE — H&P (Signed)
Advanced Heart Failure Team History and Physical Note   Primary Cardiologist:  Dr. Haroldine Laws   Reason for Admission: Cardiogenic shock   HPI:    [name] is a 56 y.o. 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   Admitted 06/06/2016 with acute resp failure requiring intubation 2/2 to volume overload and septic shock. BCx 1/2 + for strep viridans. No endocarditis or vegetation noted on TEE 06/15/16. Extubated 06/18/16. 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 Diuresed from 245 to 224 on discharge. Spent nearly a week in CIR prior to d/c. Discharge weight was 224 lbs.   Last seen in HF clinic 07/07/16. Was slowly improving with rehab but continued with NYHA IIIb symptoms, orthopnea, and poor appetite.  Hypotensive.  With this constellation of symptoms, RHC planned.   Pt was found to have profoundly low cardiac output with cardiogenic shock as below. Received adenosine 6 mg in cath lab.    RHC Procedural Findings: Hemodynamics (mmHg) RA mean 19 RV 85/18 PA 82/44 PCWP 36 AO 97% Cardiac Output (Fick) 2.79 Cardiac Index (Fick) 1.29    Review of Systems: [y] = yes, [ ]  = no   General: Weight gain [ ] ; Weight loss [ ] ; Anorexia [ ] ; Fatigue [y]; Fever [ ] ; Chills [ ] ; Weakness [y]  Cardiac: Chest pain/pressure [ ] ; Resting SOB [y]; Exertional SOB [y]; Orthopnea [y]; Pedal Edema [y]; Palpitations [ ] ; Syncope [ ] ; Presyncope [ ] ; Paroxysmal nocturnal dyspnea[ ]   Pulmonary: Cough [ ] ; Wheezing[ ] ; Hemoptysis[ ] ; Sputum [ ] ; Snoring [ ]   GI: Vomiting[ ] ; Dysphagia[ ] ; Melena[ ] ; Hematochezia [ ] ; Heartburn[ ] ; Abdominal pain [ ] ; Constipation [ ] ; Diarrhea [ ] ; BRBPR [ ]   GU: Hematuria[ ] ; Dysuria [ ] ; Nocturia[ ]     Vascular: Pain in legs with walking [ ] ; Pain in feet with lying flat [ ] ; Non-healing sores [ ] ; Stroke [ ] ; TIA [ ] ; Slurred speech [ ] ;  Neuro: Headaches[ ] ; Vertigo[ ] ; Seizures[ ] ; Paresthesias[ ] ;Blurred vision [ ] ; Diplopia [ ] ; Vision changes [ ]   Ortho/Skin: Arthritis [y]; Joint pain [y]; Muscle pain [ ] ; Joint swelling [ ] ; Back Pain [ ] ; Rash [ ]   Psych: Depression[ ] ; Anxiety[ ]   Heme: Bleeding problems [ ] ; Clotting disorders [ ] ; Anemia [ ]   Endocrine: Diabetes [ ] ; Thyroid dysfunction[ ]    Home Medications Prior to Admission medications   Medication Sig Start Date End Date Taking? Authorizing Provider  atorvastatin (LIPITOR) 40 MG tablet Take 1 tablet (40 mg total) by mouth at bedtime. 07/01/16  Yes Angiulli, Lavon Paganini, PA-C  BREO ELLIPTA 100-25 MCG/INH AEPB Inhale 1 puff into the lungs daily as needed (for shortness of breath).  12/11/14  Yes [provider]  febuxostat (ULORIC) 40 MG tablet Take 40 mg by mouth daily.   Yes [provider]  hydrALAZINE (APRESOLINE) 10 MG tablet Take 1 tablet (10 mg total) by mouth 3 (three) times daily. 07/11/16  Yes Melton Walls, Shaune Pascal, MD  HYDROcodone-acetaminophen (NORCO/VICODIN) 5-325 MG tablet Take 0.5-1 tablets by mouth every 6 (six) hours as needed for moderate pain. 07/01/16  Yes Angiulli, Lavon Paganini, PA-C  loratadine (CLARITIN) 10 MG tablet Take 1 tablet (10 mg total) by mouth daily. 07/02/16  Yes [place], Lavon Paganini,  PA-C  Magnesium 400 MG TABS Take 400 mg by mouth 2 (two) times daily. Patient taking differently: Take 800 mg by mouth 2 (two) times daily.  07/07/16  Yes Shirley Friar, PA-C  MITIGARE 0.6 MG CAPS Take 0.6 mg by mouth daily. 07/06/16  Yes [provider]  predniSONE (STERAPRED UNI-PAK 48 TAB) 10 MG (48) TBPK tablet  07/06/16  Yes [provider]  tobramycin-dexamethasone (TOBRADEX) ophthalmic solution Place 1 drop into the right eye 2 (two) times daily.   Yes [provider]   torsemide (DEMADEX) 10 MG tablet Take 10 mg by mouth every Monday, Wednesday, and Friday. 06/02/16  Yes [provider]  torsemide (DEMADEX) 20 MG tablet One half tablet Monday Wednesdays and Fridays. May take whole pill on days with extra weight gain/swelling. 07/07/16  Yes Shirley Friar, PA-C  ALPRAZolam Duanne Moron) 0.25 MG tablet Take 1-2 tablets (0.25-0.5 mg total) by mouth 3 (three) times daily as needed for anxiety. 07/01/16   Angiulli, Lavon Paganini, PA-C  cefdinir (OMNICEF) 300 MG capsule Take 300 mg by mouth 2 (two) times daily. 07/06/16   [provider]  colchicine 0.6 MG tablet Take 1 tablet (0.6 mg total) by mouth daily. Patient not taking: Reported on 07/08/2016 07/01/16   Angiulli, Lavon Paganini, PA-C  omeprazole (PRILOSEC) 40 MG capsule Take 40 mg by mouth daily as needed (acid reflux).     [provider]    Past Medical History: Past Medical History:  Diagnosis Date  . Arthritis    GOUT  . Automatic implantable cardioverter-defibrillator in situ   . Biventricular ICD (implantable cardiac defibrillator) Medtronic ]    DOI 2008/ upgrade 2010/ Gen Change 2014  . Bladder tumor    PT HOSP AT [facility] 4/24 TO 4/26 2014 WITH UTI--AND FOUND TO HAVE BLADDER TUMOR-  . Cardiomyopathy    Idiopathic dilated;   . CHF (congestive heart failure) ([place])   . CKD (chronic kidney disease)    stage III baseline Crt 1.8-2.0  . Coronary artery disease   . Gout 08/03/12   PT C/O OF RIGHT KNEE PAIN AND SWELLING -STATES GOUT FLARE UP - ONGOING SINCE APRIL - BUT SWELLING W/IN LAST WEEK  . Hilar mass    Noted CT 2010  . HTN (hypertension)    Dr. Caryl Comes cardiologist  . Hypothyroidism   . Paraganglioma ([place])    //neuroendocrine tumor of the right chest per notes 02/10/2014  . Sleep apnea    CPAP  . Systolic CHF ([place])    EF 03%  . Ventricular tachycardia [facility])    s/p ICD    Past Surgical History: Past Surgical History:  Procedure Laterality Date  . BIV ICD GENERTAOR CHANGE OUT      DOI 2008/ upgrade 2010/ Gen Change 2014   . CARDIAC CATHETERIZATION     he was found to have normal coronary arteries but with a globally dilated and hypocontractile heart  . CARDIAC DEFIBRILLATOR PLACEMENT     DOI 2008/ upgrade 2010/ Gen Change 2014   . CHEST TUBE INSERTION  09/10/2013  . CHOLECYSTECTOMY    . cornea replacement    . ENDOBRONCHIAL ULTRASOUND Bilateral 10/01/2012   Procedure: ENDOBRONCHIAL ULTRASOUND;  Surgeon: Collene Gobble, MD;  Location: [facility] END[facility]COPY;  Service: Cardiopulmonary;  Laterality: Bilateral;  . IMPLANTABLE CARDIOVERTER DEFIBRILLATOR (ICD) GENERATOR CHANGE N/A 05/02/2012   Procedure: ICD GENERATOR CHANGE;  Surgeon: Deboraha Sprang, MD;  Location: [facility] CATH LAB;  Service: Cardiovascular;  Laterality: N/A;  .  MEDIASTINOSCOPY N/A 10/25/2012   Procedure: MEDIASTINOSCOPY;  Surgeon: [name], MD;  Location: [place];  Service: Thoracic;  Laterality: N/A;  . MEDIASTINOTOMY  09/10/2013   paratracheal mass    DR Darcey Nora  . MEDIASTINOTOMY CHAMBERLAIN MCNEIL Right 09/10/2013   Procedure: MEDIASTINOTOMY CHAMBERLAIN MCNEIL;  Surgeon: [name], MD;  Location: [place];  Service: Thoracic;  Laterality: Right;  . RESECTION OF MEDIASTINAL MASS Right 02/13/2014   Procedure: RESECTION OF MEDIASTINAL MASS;  Surgeon: [name], MD;  Location: [facility] OR;  Service: Thoracic;  Laterality: Right;  PATIENT NEEDS EPIDURAL CATHETER AND A SWAN GANZ CATHETER (DR. CHRIS MOSER AWARE PER PVT)  . RIGHT HEART CATH N/A 07/13/2016   Procedure: Right Heart Cath;  Surgeon: Jolaine Artist, MD;  Location: [place] CV LAB;  Service: Cardiovascular;  Laterality: N/A;  . RIGHT HEART CATHETERIZATION N/A 01/27/2012   Procedure: RIGHT HEART CATH;  Surgeon: Jolaine Artist, MD;  Location: [facility] CATH LAB;  Service: Cardiovascular;  Laterality: N/A;  . TONSILLECTOMY    . TRANSURETHRAL RESECTION OF BLADDER TUMOR N/A 08/13/2012   Procedure: TRANSURETHRAL RESECTION OF BLADDER TUMOR (TURBT);   Surgeon: Claybon Jabs, MD;  Location: [facility] ORS;  Service: Urology;  Laterality: N/A;  MITOMYCIN C  . US ECHOCARDIOGRAPHY  03-17-2008, 07-03-2006   EF 15-20%, EF 15-20%  . VIDEO ASSISTED THORACOSCOPY (VATS)/THOROCOTOMY Right 02/13/2014   Procedure: VIDEO ASSISTED THORACOSCOPY (VATS)/THOROCOTOMY;  Surgeon: [name], MD;  Location: [facility] OR;  Service: Thoracic;  Laterality: Right;  PATIENT NEEDS EPIDURAL CATHETER (DR. CHRIS MOSER AWARE PER PVT)  . VIDEO BRONCHOSCOPY N/A 10/25/2012   Procedure: VIDEO BRONCHOSCOPY;  Surgeon: [name], MD;  Location: [facility] OR;  Service: Thoracic;  Laterality: N/A;    Family History:  Family History  Problem Relation Age of Onset  . Family history unknown: Yes   No known history of premature CAD or sudden cardiac death.   Social History: Social History   Social History  . Marital status: Married    Spouse name: N/A  . Number of children: 2  . Years of education: N/A   Occupational History  . [employer]   Social History Main Topics  . Smoking status: Never Smoker  . Smokeless tobacco: Never Used  . Alcohol use No  . Drug use: No  . Sexual activity: No   Other Topics Concern  . None   Social History Narrative  . None    Allergies:  Allergies  Allergen Reactions  . Bidil [Isosorb Dinitrate-Hydralazine] Other (See Comments)    Headaches   . Spironolactone Other (See Comments)    Hyperkalemia, headache      Objective:    Vital Signs:   Temp:  [97.4 F (36.3 C)] 97.4 F (36.3 C) (05/09 1200) Pulse Rate:  [100-196] 102 (05/09 1400) Resp:  [10-36] 14 (05/09 1400) BP: (114-146)/(79-104) 121/82 (05/09 1400) SpO2:  [0 %-100 %] 100 % (05/09 1400) Weight:  [97.8 kg (215 lb 9.8 oz)-98.9 kg (218 lb)] 97.8 kg (215 lb 9.8 oz) (05/09 1100) Last BM Date: 07/12/16 Filed Weights   07/13/16 0852 07/13/16 1100  Weight: 98.9 kg (218 lb) 97.8 kg (215 lb 9.8 oz)    Physical Exam: General:  Fatigued appearing. NAD.  HEENT:  normal. + R exophthalmus Neck: supple. JVP to jaw. Carotids 2+ bilat; no bruits. No lymphadenopathy or thyromegaly appreciated. R neck scar Cor: PMI nondisplaced. Regular rate & rhythm. + S3.  Lungs: Diminished basilar sounds.  R base nearly absent Abdomen: soft, nontender, mildly distended. No hepatosplenomegaly. No bruits or masses. Good bowel sounds. Extremities: no cyanosis, clubbing, rash, 1-2+ edema bilaterally.  Neuro: alert & oriented x 3, cranial nerves grossly intact. moves all 4 extremities w/o difficulty. Affect pleasant.  Telemetry: Reviewed personally, Sinus tach 100  Labs: Basic Metabolic Panel:  Recent Labs Lab 07/07/16 0928 07/13/16 1225  NA 133* 134*  K 4.7 5.5*  CL 103 98*  CO2 21* 24  GLUCOSE 117* 98  BUN 34* 52*  CREATININE 2.26* 3.53*  CALCIUM 9.2 9.8  MG 1.4* 2.2    Liver Function Tests:  Recent Labs Lab 07/13/16 1225  AST 23  ALT 33  ALKPHOS 85  BILITOT 1.6*  PROT 7.3  ALBUMIN 3.7   No results for input(s): LIPASE, AMYLASE in the last 168 hours. No results for input(s): AMMONIA in the last 168 hours.  CBC:  Recent Labs Lab 07/07/16 0928 07/13/16 1225  WBC 7.9 5.4  HGB 13.1 12.8*  HCT 39.4 38.4*  MCV 89.3 89.3  PLT 133* 141*    Cardiac Enzymes: No results for input(s): CKTOTAL, CKMB, CKMBINDEX, TROPONINI in the last 168 hours.  BNP: BNP (last 3 results)  Recent Labs  06/06/16 1645 06/06/16 1925 07/07/16 0929  BNP 796.0* 553.0* 932.0*    ProBNP (last 3 results) No results for input(s): PROBNP in the last 8760 hours.   CBG:  Recent Labs Lab 07/13/16 1200  GLUCAP 113*    Coagulation Studies: No results for input(s): LABPROT, INR in the last 72 hours.  Other results: EKG: pending   Imaging: No results found.   Assessment/Plan   1. 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 AKI on CKD III 3. Paralyzed right hemidiaphragm 4. Hx of paraganglioma resection 5. Septic Shock due to CAP admission  06/2016 6. OSA on BiPAP  Cardiogenic shock with markedly low output on cath today.  Will start on milrinone 0.25 mcg/kg/min and follow coox.  Will need diuresis once on inotrope.   Pt will need consideration for home inotrope support.   Will order his home BiPAP.   Leave swan in. Transfer to [facility] ICU.    Length of Stay: 0  Glori Bickers, MD 07/13/2016, 3:02 PM  Advanced Heart Failure Team Pager [phone] (M-F; 7a - 4p)  Please contact [facility] for night-coverage after hours (4p -7a ) and weekends on [email or web address]  Agree.   He is critically ill with advanced HF and cardiogenic shock. Now with progressive multi-system organ failure. Will admit to ICU and start inotropic support.   I have reviewed his most recent echo personally and EF 15% with at least moderate RV HK.   His medically h/o has also been complicated CKD III-IV, large thoracic paraganglioma s/p resection, paralyzed R hemidiaphragm and progressive FTT.   On exam very fatigued and pale CVP to ear Cor tachy reg + s3 Lungs decreased at bases Ab distended NT Ext: cool 1-2+ edema  I had long talk with him and his son. He has end-stage HF and given comorbidities, I am not sure that he will be a candidate for mechanical support. Will treat aggressively with inotropes and follow his hemodynamics and renal function closely.   CRITICAL CARE Performed by: Glori Bickers  Total critical care time: 45 minutes  Critical care time was exclusive of separately billable procedures and treating other patients.  Critical care was necessary to treat or prevent imminent or life-threatening deterioration.  Critical care was time  spent personally by me (independent of midlevel providers or residents) on the following activities: development of treatment plan with patient and/or surrogate as well as nursing, discussions with consultants, evaluation of patient's response to treatment, examination of patient, obtaining history from  patient or surrogate, ordering and performing treatments and interventions, ordering and review of laboratory studies, ordering and review of radiographic studies, pulse oximetry and re-evaluation of patient's condition.    Glori Bickers, MD  3:07 PM

## 2016-07-13 NOTE — H&P (View-Only) (Signed)
Patient ID: [name], male   DOB: 1960-07-01, 56 y.o.   MRN: [id number]  PCP: Dr Rex Kras  Nephrologist: Dr Marval Regal  Cardiologist: Dr Caryl Comes Urologist: Dr Karsten Ro  HPI: [name] is a 56 y.o. 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   He is intolerant to numerous HF meds including spiro (severe hyperkalemia - requiring HD), hydralazine (fuzzy-headed) and imdur (severe HAs). S/P 12/17/12 bladder cancer removal.   11/29/11 ECHO EF 15% Diffuse hypokinesis. Features are consistent with a pseudonormal left ventricular filling pattern, with concomitant abnormal relaxation and increased filling pressure (grade 2 diastolic dysfunction).   Admitted to [facility] 01/26/2012 after his ICD fired 5 times Potassium >7.5 Required urgent dialysis. Discharged form [facility] 01/29/12. Discharge weight 250 pounds.   CPX 07/12/13 FVC 1.55 (37%)  FEV1 1.19 (36%)  FEV1/FVC 77%  MVV 53 (35%) Exercise Time: 3:30 Speed (mph): 1.5 Grade (%): 0  RPE: 15 Resting HR: 109 Peak HR: 141 (84% age predicted max HR) BP rest: 130/90 BP peak: 142/96 Peak VO2: 9.0 (34.7% predicted peak VO2)  VE/VCO2 slope: 32.0 OUES: 1.90 Peak RER: 0.90 Ventilatory Threshold: Could not be detected VE/MVV: 58.5% PETCO2 at peak: 33 O2pulse: 8 (44% predicted O2pulse)  ECHO 02/25/13 EF <15%   He is S/P 02/13/2014 RVATS and R thoracotomy  With resection of paraganglioma. Required milrinone support post-operatively.   Admitted 06/06/2016 with acute resp failure requiring intubation 2/2 to volume overload and septic shock. BCx 1/2 + for strep viridans. No endocarditis or vegetation noted on TEE 06/15/16. Extubated 06/18/16. 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 Diuresed from 245  to 224 on discharge. Spent nearly a week in CIR prior to d/c. Discharge weight was 224 lbs.   ECHO 06/09/2016 EF 20%, severe LV dilation, Mild AI, Mod MR, Mod LAE.   He presents today for post hospital follow up. Still feels very tired. SOB with minimal exertion. Sits down in the shower. Pretty fatigued afterward. Denies fever, chills, or diarrhea.  Has been having nausea and dry heaves. Taking prilosec for GERD. Thinks it helps a little. Does have mild orthopnea but seems positional due to his R diaphragm paraplegia. Awaiting new BiPAP mask, needs new mask. Saw pulmonologist last week and they are arranging. Appetite still poor. Eating what he can. Couldn't finish his breakfast this am. Watching fluids as well.   Optivol: Fluid index below threshold but trending up. Thoracic impedence depressed. Occasional SVT. ? AT/AF, but no more than a few minutes most days. PT activity negligent, barely diverges from baseline.   PFTs 05/2016 FVC 1.22 (32%) FEV1 0.95 (32%) TLC 3.58 (53%) DLCO 11.8 (42%)  Labs  12/12/12 dig level 0.9 12/18/12 Magnesium 1.1 K 4.0 Creatinine 1.5 01/02/13: Uric acid 12.9, K+ 3.9, Cr 1.37, AST 21, ALT 19, Mag 1.3 01/29/13 K 4.1 Creatinine 1.5 Magnesium 1.5 03/21/13 Magnesium 1.7 TSH 1.11--> synthorid increased to 100 mcg.  7/15: K 4, creatinine 1.35 8/15: digoxin 0.5 12/2013: K+ 4.4, creatinine 1.39 7/16 K 4.9, creatinine 1.58  Review of systems complete and found to be negative unless listed in HPI.    Past Medical History:  Diagnosis Date  . Arthritis    GOUT  . Automatic implantable cardioverter-defibrillator in situ   . Biventricular ICD (implantable cardiac defibrillator) Medtronic ]  DOI 2008/ upgrade 2010/ Gen Change 2014  . Bladder tumor    PT HOSP AT [facility] 4/24 TO 4/26 2014 WITH UTI--AND FOUND TO HAVE BLADDER TUMOR-  . Cardiomyopathy    Idiopathic dilated;   . CHF (congestive heart failure) ([place])   . CKD (chronic kidney disease)    stage III baseline Crt  1.8-2.0  . Coronary artery disease   . Gout 08/03/12   PT C/O OF RIGHT KNEE PAIN AND SWELLING -STATES GOUT FLARE UP - ONGOING SINCE APRIL - BUT SWELLING W/IN LAST WEEK  . Hilar mass    Noted CT 2010  . HTN (hypertension)    Dr. Caryl Comes cardiologist  . Hypothyroidism   . Paraganglioma ([place])    //neuroendocrine tumor of the right chest per notes 02/10/2014  . Sleep apnea    CPAP  . Systolic CHF ([place])    EF 73%  . Ventricular tachycardia [facility])    s/p ICD    Current Outpatient Prescriptions  Medication Sig Dispense Refill  . ALPRAZolam (XANAX) 0.25 MG tablet Take 1-2 tablets (0.25-0.5 mg total) by mouth 3 (three) times daily as needed for anxiety. 20 tablet 0  . atorvastatin (LIPITOR) 40 MG tablet Take 1 tablet (40 mg total) by mouth at bedtime. 30 tablet 2  . BREO ELLIPTA 100-25 MCG/INH AEPB Inhale 1 puff into the lungs daily as needed (for shortness of breath).   11  . colchicine 0.6 MG tablet Take 1 tablet (0.6 mg total) by mouth daily. 30 tablet 0  . febuxostat (ULORIC) 40 MG tablet Take 40 mg by mouth daily.    . hydrALAZINE (APRESOLINE) 10 MG tablet Take 10 mg by mouth every morning.    Marland Kitchen HYDROcodone-acetaminophen (NORCO/VICODIN) 5-325 MG tablet Take 0.5-1 tablets by mouth every 6 (six) hours as needed for moderate pain. 20 tablet 0  . loratadine (CLARITIN) 10 MG tablet Take 1 tablet (10 mg total) by mouth daily. 30 tablet 0  . omeprazole (PRILOSEC) 40 MG capsule Take 40 mg by mouth daily.    Marland Kitchen tobramycin-dexamethasone (TOBRADEX) ophthalmic solution Place 1 drop into the right eye 2 (two) times daily.    Marland Kitchen torsemide (DEMADEX) 20 MG tablet One half tablet Monday Wednesdays and Fridays 30 tablet 0   No current facility-administered medications for this encounter.      Vitals:   07/07/16 0841  BP: 124/76  Pulse: (!) 128  SpO2: 99%  Weight: 225 lb (102.1 kg)   Wt Readings from Last 3 Encounters:  07/07/16 225 lb (102.1 kg)  07/01/16 224 lb 3.9 oz (101.7 kg)  06/24/16 215 lb  6.4 oz (97.7 kg)    PHYSICAL EXAM: General: Fatigued appearing.  HEENT: Normal except for R exopthalmus.  Neck: Supple. JVD 7-8 cm. 2+ bilat; no bruits. No thyromegaly or nodule noted. R neck scar Cor: PMI nondisplaced. RRR, No M/G/R noted + 3. Lungs: Diminished basilar sounds. R base nearly absent  Abdomen: soft, non-tender, mildly distended, no HSM. No bruits or masses. +BS  Extremities: no cyanosis, clubbing, rash, Trace to 1+ ankle edema bilaterally.  Neuro: Alert & oriented x 3. Cranial nerves grossly intact. Moves all 4 extremities w/o difficulty. Affect pleasant    ASSESSMENT & PLAN:  1. Chronic sysotolic CHF.  - Echo 06/05/9377 with EF 20%. 15% by TEE 06/15/16.  - Functional status prior to d/c concerning for low output. NYHA Class IIIb today. Remains tachycardic and with S3 on exam. Will schedule for RHC next week. Pre-cath labs today.  -  Volume status looks stable from discharge on exam, though mildly elevated by optivol.  - Continue torsemide 10 mg every M/W/F with extra 10 mg as needed for weight gain. Will adjust further after RHC.  - Increase hydralazine 10 mg too TID. Has only been taking daily. Has previously had non specific "fuzzy-headed" symptoms on this, so may limit up-titration.  - Intolerant to isordil with severe headaches.  - Reinforced fluid restriction to < 2 L daily, sodium restriction to less than 2000 mg daily, and the importance of daily weights.     2. AKI on CKD III  - Remained elevated but baseline on d/c.  BMET today.  3. Paralzyed right hemidiaphragm.  - Stable. Some chronic discomfort. Off 02 .  - May eventually be candidate for diaphragm plication.   but would needs to continue to improve before this can be considered.  4. 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 No change.  5. Septic Shock due to CAP admission 06/2016 - Initial cultures + 1 of 2 strep viridans.  - Completed ABX. TEE negative. Follow up cultures negative.  6. OSA  on BiPAP - Awaiting new mask.   Unclear component of his deconditioning combined with possible low output. Will plan for RHC with Dr. Haroldine Laws next week. Pre cath labs today and meds as above. RTC 2 weeks if RHC stable. Likely admit if low output for inotrope needed.   Jamie Friar, PA-C  07/07/2016   Greater than 50% of the 25 minute visit was spent in counseling/coordination of care regarding disease state education, medication reconciliation, review of lab results, and sliding scale diuretics.

## 2016-07-14 ENCOUNTER — Inpatient Hospital Stay (HOSPITAL_COMMUNITY): Payer: 59

## 2016-07-14 DIAGNOSIS — I472 Ventricular tachycardia: Secondary | ICD-10-CM

## 2016-07-14 LAB — CBC WITH DIFFERENTIAL/PLATELET
Basophils Absolute: 0 10*3/uL (ref 0.0–0.1)
Basophils Relative: 0 %
EOS ABS: 0.2 10*3/uL (ref 0.0–0.7)
EOS PCT: 3 %
HCT: 36.1 % — ABNORMAL LOW (ref 39.0–52.0)
Hemoglobin: 12 g/dL — ABNORMAL LOW (ref 13.0–17.0)
LYMPHS ABS: 0.8 10*3/uL (ref 0.7–4.0)
Lymphocytes Relative: 11 %
MCH: 29.6 pg (ref 26.0–34.0)
MCHC: 33.2 g/dL (ref 30.0–36.0)
MCV: 88.9 fL (ref 78.0–100.0)
MONOS PCT: 15 %
Monocytes Absolute: 1 10*3/uL (ref 0.1–1.0)
Neutro Abs: 4.9 10*3/uL (ref 1.7–7.7)
Neutrophils Relative %: 71 %
PLATELETS: 155 10*3/uL (ref 150–400)
RBC: 4.06 MIL/uL — AB (ref 4.22–5.81)
RDW: 14.5 % (ref 11.5–15.5)
WBC: 6.9 10*3/uL (ref 4.0–10.5)

## 2016-07-14 LAB — COMPREHENSIVE METABOLIC PANEL
ALT: 25 U/L (ref 17–63)
AST: 24 U/L (ref 15–41)
Albumin: 3.5 g/dL (ref 3.5–5.0)
Alkaline Phosphatase: 81 U/L (ref 38–126)
Anion gap: 9 (ref 5–15)
BILIRUBIN TOTAL: 1.3 mg/dL — AB (ref 0.3–1.2)
BUN: 49 mg/dL — AB (ref 6–20)
CO2: 28 mmol/L (ref 22–32)
CREATININE: 3.21 mg/dL — AB (ref 0.61–1.24)
Calcium: 9.2 mg/dL (ref 8.9–10.3)
Chloride: 97 mmol/L — ABNORMAL LOW (ref 101–111)
GFR calc Af Amer: 23 mL/min — ABNORMAL LOW (ref >60)
GFR, EST NON AFRICAN AMERICAN: 20 mL/min — AB (ref >60)
Glucose, Bld: 147 mg/dL — ABNORMAL HIGH (ref 65–99)
Potassium: 4.3 mmol/L (ref 3.5–5.1)
Sodium: 134 mmol/L — ABNORMAL LOW (ref 135–145)
TOTAL PROTEIN: 6.7 g/dL (ref 6.5–8.1)

## 2016-07-14 LAB — COOXEMETRY PANEL
Carboxyhemoglobin: 1.9 % — ABNORMAL HIGH (ref 0.5–1.5)
Methemoglobin: 1 % (ref 0.0–1.5)
O2 Saturation: 78 %
TOTAL HEMOGLOBIN: 12.4 g/dL (ref 12.0–16.0)

## 2016-07-14 LAB — MAGNESIUM: Magnesium: 1.9 mg/dL (ref 1.7–2.4)

## 2016-07-14 LAB — BASIC METABOLIC PANEL
Anion gap: 10 (ref 5–15)
BUN: 52 mg/dL — AB (ref 6–20)
CHLORIDE: 97 mmol/L — AB (ref 101–111)
CO2: 27 mmol/L (ref 22–32)
CREATININE: 3.51 mg/dL — AB (ref 0.61–1.24)
Calcium: 9.4 mg/dL (ref 8.9–10.3)
GFR calc Af Amer: 21 mL/min — ABNORMAL LOW (ref >60)
GFR calc non Af Amer: 18 mL/min — ABNORMAL LOW (ref >60)
GLUCOSE: 132 mg/dL — AB (ref 65–99)
POTASSIUM: 4.2 mmol/L (ref 3.5–5.1)
SODIUM: 134 mmol/L — AB (ref 135–145)

## 2016-07-14 MED ORDER — MAGNESIUM SULFATE 2 GM/50ML IV SOLN
2.0000 g | Freq: Once | INTRAVENOUS | Status: AC
  Administered 2016-07-14: 2 g via INTRAVENOUS

## 2016-07-14 MED ORDER — MAGNESIUM SULFATE 2 GM/50ML IV SOLN
INTRAVENOUS | Status: AC
  Filled 2016-07-14: qty 50

## 2016-07-14 MED ORDER — FUROSEMIDE 10 MG/ML IJ SOLN
160.0000 mg | Freq: Three times a day (TID) | INTRAVENOUS | Status: DC
  Administered 2016-07-14 – 2016-07-17 (×9): 160 mg via INTRAVENOUS
  Filled 2016-07-14: qty 16
  Filled 2016-07-14: qty 4
  Filled 2016-07-14: qty 10
  Filled 2016-07-14: qty 16
  Filled 2016-07-14: qty 10
  Filled 2016-07-14 (×5): qty 16

## 2016-07-14 NOTE — Progress Notes (Signed)
RN observed pt in VT with a rate >200. RN to bedside with oncoming RN. Pt alert but verbalized feeling "different" and dizzy. Pt asked to cough. ICD paced pt out of rhythm. Dr. Haroldine Laws paged. Orders given to check Mag and K. Pt returned to bed once he verbalized feeling better. BP 91/69, HR 120 V-paced. Pt will be monitor and assessed closely by night shift RN.

## 2016-07-14 NOTE — Plan of Care (Signed)
Problem: Cardiac: Goal: Ability to achieve and maintain adequate cardiopulmonary perfusion will improve Outcome: Progressing Pt hemodynamics maintained with milrinone gtt

## 2016-07-14 NOTE — Progress Notes (Signed)
Advanced Heart Failure Rounding Note   Primary Cardiologist: Dr. Haroldine Laws   Subjective:    Admitted from cath lab 07/13/16 with cardiogenic shock.  Required high dose IV lasix and milrinone up to 0.375 mcg/kg/min  Less fatigued and less SOB this am. Denies lightheadedness or dizziness. Pressures have actually come up. HR increased as well, up into 120s earlier this am  CXR this am with formal read pending. R hemidiaphragm paralysis severely decreases R lung capacity. + pulmonary edema by my assessment.   Coox 78% this am on milrinone 0.375 mcg/kg/min. Creatinine stable, though not improved.   Urine output 1.1 L yesterday (negative 360 cc) on lasix 120 mg BID ( got dose of 160 mg yesterday afternoon)  Swan numbers 0700 PAP 78/42 (53)  Objective:   Weight Range: 215 lb 9.8 oz (97.8 kg) Body mass index is 30.94 kg/m.   Vital Signs:   Temp:  [97.4 F (36.3 C)-98.2 F (36.8 C)] 98 F (36.7 C) (05/09 2300) Pulse Rate:  [100-196] 111 (05/10 0700) Resp:  [0-36] 17 (05/10 0700) BP: (100-152)/(64-139) 152/139 (05/10 0700) SpO2:  [0 %-100 %] 97 % (05/10 0700) Weight:  [215 lb 9.8 oz (97.8 kg)-218 lb (98.9 kg)] 215 lb 9.8 oz (97.8 kg) (05/10 0600) Last BM Date: 07/12/16  Weight change: Filed Weights   07/13/16 0852 07/13/16 1100 07/14/16 0600  Weight: 218 lb (98.9 kg) 215 lb 9.8 oz (97.8 kg) 215 lb 9.8 oz (97.8 kg)    Intake/Output:   Intake/Output Summary (Last 24 hours) at 07/14/16 0742 Last data filed at 07/14/16 0700  Gross per 24 hour  Intake           786.12 ml  Output             1150 ml  Net          -363.88 ml     Physical Exam: General:  Fatigued and chronically ill appearing.  HEENT: Normal Neck: supple. JVP to jaw. Carotids 2+ bilat; no bruits. No lymphadenopathy or thyromegaly appreciated. Cor: PMI nondisplaced. Regular rate & rhythm. + S2 Lungs: Markedly diminished breath sounds. R base nearly absent.  Abdomen: soft, nontender, mild distention. No  hepatosplenomegaly. No bruits or masses. Good bowel sounds. Extremities: no cyanosis, clubbing, rash, 1-2 + edema. R brachial swan site C/D/I Neuro: alert & orientedx3, cranial nerves grossly intact. moves all 4 extremities w/o difficulty. Affect flat  Telemetry: Reviewed personally, V pacing. Rates up into 110s  Labs: CBC  Recent Labs  07/13/16 1225 07/14/16 0452  WBC 5.4 6.9  NEUTROABS  --  4.9  HGB 12.8* 12.0*  HCT 38.4* 36.1*  MCV 89.3 88.9  PLT 141* 829   Basic Metabolic Panel  Recent Labs  07/13/16 1225 07/14/16 0452  NA 134* 134*  K 5.5* 4.2  CL 98* 97*  CO2 24 27  GLUCOSE 98 132*  BUN 52* 52*  CREATININE 3.53* 3.51*  CALCIUM 9.8 9.4  MG 2.2  --    Liver Function Tests  Recent Labs  07/13/16 1225  AST 23  ALT 33  ALKPHOS 85  BILITOT 1.6*  PROT 7.3  ALBUMIN 3.7   No results for input(s): LIPASE, AMYLASE in the last 72 hours. Cardiac Enzymes No results for input(s): CKTOTAL, CKMB, CKMBINDEX, TROPONINI in the last 72 hours.  BNP: BNP (last 3 results)  Recent Labs  06/06/16 1645 06/06/16 1925 07/07/16 0929  BNP 796.0* 553.0* 932.0*    ProBNP (last 3 results) No results  for input(s): PROBNP in the last 8760 hours.   D-Dimer No results for input(s): DDIMER in the last 72 hours. Hemoglobin A1C No results for input(s): HGBA1C in the last 72 hours. Fasting Lipid Panel No results for input(s): CHOL, HDL, LDLCALC, TRIG, CHOLHDL, LDLDIRECT in the last 72 hours. Thyroid Function Tests  Recent Labs  07/13/16 1225  TSH 2.212    Other results:     Imaging/Studies:   No results found.    Medications:     Scheduled Medications: . atorvastatin  40 mg Oral QHS  . Chlorhexidine Gluconate Cloth  6 each Topical Daily  . Chlorhexidine Gluconate Cloth  6 each Topical Q0600  . enoxaparin (LOVENOX) injection  30 mg Subcutaneous Q24H  . febuxostat  40 mg Oral Daily  . loratadine  10 mg Oral Daily  . magnesium oxide  400 mg Oral BID  .  mupirocin ointment  1 application Nasal BID  . pantoprazole  40 mg Oral Daily  . sodium chloride flush  10-40 mL Intracatheter Q12H  . sodium chloride flush  3 mL Intravenous Q12H  . sodium chloride flush  3 mL Intravenous Q12H  . sodium chloride flush  3 mL Intravenous Q12H  . tobramycin-dexamethasone  1 drop Right Eye BID     Infusions: . sodium chloride 250 mL (07/14/16 0700)  . sodium chloride 10 mL/hr at 07/14/16 0715  . sodium chloride Stopped (07/13/16 1900)  . sodium chloride    . furosemide Stopped (07/13/16 2220)  . milrinone 0.375 mcg/kg/min (07/14/16 0715)     PRN Medications:  sodium chloride, sodium chloride, sodium chloride, acetaminophen, ALPRAZolam, fluticasone furoate-vilanterol, HYDROcodone-acetaminophen, ondansetron (ZOFRAN) IV, sodium chloride flush, sodium chloride flush, sodium chloride flush, sodium chloride flush   Assessment/Plan   1. 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 AKI on CKD III 3. Paralyzed right hemidiaphragm 4. Hx of paraganglioma resection 5. Recent septic shock due to CAP admission 06/2016 6. OSA on BiPAP 7. NSVT  Coox much improved this am. Remains extremely tenuous. Will likely need central line placement today. Brachial swan remains in place.  Continue milrinone 0.375 mcg/kg/min.  Pressures now coming up. May need gentle afterload reduction.   Will likely need home milrinone.   Continue 120 mg IV lasix BID for now.  He is not currently a good candidate for mechanical support with no good endpoint and compromised respiratory status with paralyzed R hemidiaphragm.   Patient is critically ill and remains in imminent danger of multiple organ failure.   Length of Stay: 1  Annamaria Helling  07/14/2016, 7:42 AM  Advanced Heart Failure Team Pager [phone] (M-F; 7a - 4p)  Please contact [facility] for night-coverage after hours (4p -7a ) and weekends on [email or web address]  Agree.   He remains extremely  tenuous with end stage HF and acute on chronic renal failure. Swan pressures measured personally and filling pressures remain very high. Will increase lasix to 160 q8. Tachycardic and having runs of MSVT on tele. Will cut milrinone back to 0.25. Supp mag   On exam Critically ill appearing lethargic JVP to ear Cor tachy regular +s3 Lungs dull at bases with basilar crackles.  Ab distended. NT Extremities cool with mild edema.   Will continue inotropic support and increase his diuretics, Watch co-ox and renal function very closely. Not sure we have many options left for him. Not candidate for mechanica support due to CKD and other comorbidities.   CRITICAL CARE Performed by: Glori Bickers  Total critical care time: 35 minutes  Critical care time was exclusive of separately billable procedures and treating other patients.  Critical care was necessary to treat or prevent imminent or life-threatening deterioration.  Critical care was time spent personally by me (independent of midlevel providers or residents) on the following activities: development of treatment plan with patient and/or surrogate as well as nursing, discussions with consultants, evaluation of patient's response to treatment, examination of patient, obtaining history from patient or surrogate, ordering and performing treatments and interventions, ordering and review of laboratory studies, ordering and review of radiographic studies, pulse oximetry and re-evaluation of patient's condition.    Glori Bickers, MD  9:05 PM

## 2016-07-15 ENCOUNTER — Other Ambulatory Visit (HOSPITAL_COMMUNITY): Payer: Self-pay | Admitting: Internal Medicine

## 2016-07-15 ENCOUNTER — Inpatient Hospital Stay (HOSPITAL_COMMUNITY): Payer: 59

## 2016-07-15 DIAGNOSIS — G4733 Obstructive sleep apnea (adult) (pediatric): Secondary | ICD-10-CM

## 2016-07-15 LAB — COMPREHENSIVE METABOLIC PANEL
ALBUMIN: 3.2 g/dL — AB (ref 3.5–5.0)
ALT: 23 U/L (ref 17–63)
ANION GAP: 9 (ref 5–15)
AST: 19 U/L (ref 15–41)
Alkaline Phosphatase: 74 U/L (ref 38–126)
BILIRUBIN TOTAL: 1 mg/dL (ref 0.3–1.2)
BUN: 48 mg/dL — ABNORMAL HIGH (ref 6–20)
CO2: 29 mmol/L (ref 22–32)
Calcium: 8.9 mg/dL (ref 8.9–10.3)
Chloride: 95 mmol/L — ABNORMAL LOW (ref 101–111)
Creatinine, Ser: 3.06 mg/dL — ABNORMAL HIGH (ref 0.61–1.24)
GFR calc Af Amer: 24 mL/min — ABNORMAL LOW (ref >60)
GFR calc non Af Amer: 21 mL/min — ABNORMAL LOW (ref >60)
GLUCOSE: 139 mg/dL — AB (ref 65–99)
POTASSIUM: 4.1 mmol/L (ref 3.5–5.1)
Sodium: 133 mmol/L — ABNORMAL LOW (ref 135–145)
TOTAL PROTEIN: 6.3 g/dL — AB (ref 6.5–8.1)

## 2016-07-15 LAB — CBC WITH DIFFERENTIAL/PLATELET
BASOS PCT: 0 %
Basophils Absolute: 0 10*3/uL (ref 0.0–0.1)
EOS ABS: 0.2 10*3/uL (ref 0.0–0.7)
Eosinophils Relative: 3 %
HCT: 35.8 % — ABNORMAL LOW (ref 39.0–52.0)
Hemoglobin: 11.8 g/dL — ABNORMAL LOW (ref 13.0–17.0)
Lymphocytes Relative: 13 %
Lymphs Abs: 0.9 10*3/uL (ref 0.7–4.0)
MCH: 29.5 pg (ref 26.0–34.0)
MCHC: 33 g/dL (ref 30.0–36.0)
MCV: 89.5 fL (ref 78.0–100.0)
MONOS PCT: 17 %
Monocytes Absolute: 1.2 10*3/uL — ABNORMAL HIGH (ref 0.1–1.0)
NEUTROS PCT: 67 %
Neutro Abs: 4.8 10*3/uL (ref 1.7–7.7)
Platelets: 139 10*3/uL — ABNORMAL LOW (ref 150–400)
RBC: 4 MIL/uL — ABNORMAL LOW (ref 4.22–5.81)
RDW: 14.4 % (ref 11.5–15.5)
WBC: 7.1 10*3/uL (ref 4.0–10.5)

## 2016-07-15 LAB — PHOSPHORUS: Phosphorus: 3.8 mg/dL (ref 2.5–4.6)

## 2016-07-15 LAB — MAGNESIUM
MAGNESIUM: 2 mg/dL (ref 1.7–2.4)
Magnesium: 2.3 mg/dL (ref 1.7–2.4)

## 2016-07-15 LAB — BASIC METABOLIC PANEL
ANION GAP: 9 (ref 5–15)
Anion gap: 10 (ref 5–15)
BUN: 46 mg/dL — ABNORMAL HIGH (ref 6–20)
BUN: 43 mg/dL — ABNORMAL HIGH (ref 6–20)
CALCIUM: 9 mg/dL (ref 8.9–10.3)
CALCIUM: 9.2 mg/dL (ref 8.9–10.3)
CHLORIDE: 94 mmol/L — AB (ref 101–111)
CO2: 28 mmol/L (ref 22–32)
CO2: 30 mmol/L (ref 22–32)
CREATININE: 2.9 mg/dL — AB (ref 0.61–1.24)
CREATININE: 2.79 mg/dL — AB (ref 0.61–1.24)
Chloride: 95 mmol/L — ABNORMAL LOW (ref 101–111)
GFR calc Af Amer: 27 mL/min — ABNORMAL LOW (ref >60)
GFR calc non Af Amer: 23 mL/min — ABNORMAL LOW (ref >60)
GFR calc non Af Amer: 24 mL/min — ABNORMAL LOW (ref >60)
GFR, EST AFRICAN AMERICAN: 26 mL/min — AB (ref >60)
Glucose, Bld: 100 mg/dL — ABNORMAL HIGH (ref 65–99)
Glucose, Bld: 128 mg/dL — ABNORMAL HIGH (ref 65–99)
Potassium: 4.1 mmol/L (ref 3.5–5.1)
Potassium: 4.3 mmol/L (ref 3.5–5.1)
Sodium: 133 mmol/L — ABNORMAL LOW (ref 135–145)
Sodium: 133 mmol/L — ABNORMAL LOW (ref 135–145)

## 2016-07-15 LAB — COOXEMETRY PANEL
Carboxyhemoglobin: 1.3 % (ref 0.5–1.5)
METHEMOGLOBIN: 1 % (ref 0.0–1.5)
O2 SAT: 66.1 %
TOTAL HEMOGLOBIN: 12.1 g/dL (ref 12.0–16.0)

## 2016-07-15 MED ORDER — SODIUM CHLORIDE 0.9% FLUSH: 10.0000 mL | INTRAVENOUS | Status: DC | PRN

## 2016-07-15 MED ORDER — SODIUM CHLORIDE 0.9% FLUSH
10.0000 mL | Freq: Two times a day (BID) | INTRAVENOUS | Status: DC
  Administered 2016-07-15 – 2016-07-19 (×7): 10 mL

## 2016-07-15 MED ORDER — CHLORHEXIDINE GLUCONATE CLOTH 2 % EX PADS
6.0000 | MEDICATED_PAD | Freq: Every day | CUTANEOUS | Status: DC
  Administered 2016-07-15 – 2016-07-17 (×3): 6 via TOPICAL

## 2016-07-15 NOTE — Progress Notes (Signed)
Labs reported to Cardiology Fellow, order given to decrease milrinone. Pt vital signs stable. Will continue to monitor.

## 2016-07-15 NOTE — Progress Notes (Signed)
RT placed patient on BIPAP for the night. Patient resting comfortably at this time. RT will monitor as needed.

## 2016-07-15 NOTE — Progress Notes (Signed)
Advanced Heart Failure Rounding Note   Primary Cardiologist: Dr. Haroldine Laws   Subjective:    Admitted from cath lab 07/13/16 with cardiogenic shock.  Required high dose IV lasix and milrinone up to 0.375 mcg/kg/min  CXR 07/14/16 with low volume chest. Asymmetric R diaphragm. Cardiomegaly and vascular congestion. Images reviewed personally.   Coox 66.1% on milrinone 0.125 mcg/kg/min. Creatinine stable to improved.   Milrinone decreased overnight with VT into 190-200s. Electrolytes stable. Pt states he did not feel any different during these episodes. Remains fatigued and SOB with minimal exertion.   Urine output 1.5L and down 2 lbs. Lasix increased to 160 q 8 hrs last night.   Swan numbers 0700 PAP 74/34 (47)  Objective:   Weight Range: 213 lb 6.5 oz (96.8 kg) Body mass index is 30.62 kg/m.   Vital Signs:   Temp:  [97.5 F (36.4 C)-99.1 F (37.3 C)] 97.5 F (36.4 C) (05/11 0401) Pulse Rate:  [107-122] 113 (05/11 0700) Resp:  [0-25] 18 (05/11 0700) BP: (101-136)/(56-103) 113/103 (05/11 0700) SpO2:  [97 %-100 %] 100 % (05/11 0700) Weight:  [213 lb 6.5 oz (96.8 kg)] 213 lb 6.5 oz (96.8 kg) (05/11 0500) Last BM Date: 07/13/16  Weight change: Filed Weights   07/13/16 1100 07/14/16 0600 07/15/16 0500  Weight: 215 lb 9.8 oz (97.8 kg) 215 lb 9.8 oz (97.8 kg) 213 lb 6.5 oz (96.8 kg)    Intake/Output:   Intake/Output Summary (Last 24 hours) at 07/15/16 0745 Last data filed at 07/15/16 0726  Gross per 24 hour  Intake           745.75 ml  Output             2575 ml  Net         -1829.25 ml     Physical Exam: General: Chronically ill and fatigued appearing.  HEENT: normal Neck: supple. JVP to jaw. Carotids 2+ bilat; no bruits. No thyromegaly or nodule noted. Cor: PMI nondisplaced. RRR, +S3 Lungs: Diminished breath sounds. R base nearly basent.  Abdomen: soft, non-tender, mild distention, no HSM. No bruits or masses. +BS  Extremities: no cyanosis, clubbing, or rash.  R brachial swan site C/D/I.  Neuro: alert & orientedx3, cranial nerves grossly intact. moves all 4 extremities w/o difficulty. Affect flat but appropriate.   Telemetry: Personally reviewed, V pacing. VT overnight into 200s Paced out.    Labs: CBC  Recent Labs  07/14/16 0452 07/15/16 0500  WBC 6.9 7.1  NEUTROABS 4.9 4.8  HGB 12.0* 11.8*  HCT 36.1* 35.8*  MCV 88.9 89.5  PLT 155 235*   Basic Metabolic Panel  Recent Labs  07/14/16 1928 07/15/16 0038 07/15/16 0500  NA 134* 133* 133*  K 4.3 4.1 4.1  CL 97* 95* 95*  CO2 28 29 28   GLUCOSE 147* 139* 100*  BUN 49* 48* 46*  CREATININE 3.21* 3.06* 2.90*  CALCIUM 9.2 8.9 9.0  MG 1.9 2.3  --   PHOS  --  3.8  --    Liver Function Tests  Recent Labs  07/14/16 1928 07/15/16 0038  AST 24 19  ALT 25 23  ALKPHOS 81 74  BILITOT 1.3* 1.0  PROT 6.7 6.3*  ALBUMIN 3.5 3.2*   No results for input(s): LIPASE, AMYLASE in the last 72 hours. Cardiac Enzymes No results for input(s): CKTOTAL, CKMB, CKMBINDEX, TROPONINI in the last 72 hours.  BNP: BNP (last 3 results)  Recent Labs  06/06/16 1645 06/06/16 1925 07/07/16 3614  BNP 796.0* 553.0* 932.0*    ProBNP (last 3 results) No results for input(s): PROBNP in the last 8760 hours.   D-Dimer No results for input(s): DDIMER in the last 72 hours. Hemoglobin A1C No results for input(s): HGBA1C in the last 72 hours. Fasting Lipid Panel No results for input(s): CHOL, HDL, LDLCALC, TRIG, CHOLHDL, LDLDIRECT in the last 72 hours. Thyroid Function Tests  Recent Labs  07/13/16 1225  TSH 2.212    Other results:     Imaging/Studies:  No results found.    Medications:     Scheduled Medications: . atorvastatin  40 mg Oral QHS  . Chlorhexidine Gluconate Cloth  6 each Topical Daily  . Chlorhexidine Gluconate Cloth  6 each Topical Q0600  . enoxaparin (LOVENOX) injection  30 mg Subcutaneous Q24H  . febuxostat  40 mg Oral Daily  . loratadine  10 mg Oral Daily  .  magnesium oxide  400 mg Oral BID  . mupirocin ointment  1 application Nasal BID  . pantoprazole  40 mg Oral Daily  . sodium chloride flush  10-40 mL Intracatheter Q12H  . tobramycin-dexamethasone  1 drop Right Eye BID    Infusions: . sodium chloride 250 mL (07/14/16 1825)  . sodium chloride Stopped (07/14/16 0800)  . furosemide Stopped (07/15/16 0709)  . milrinone 0.125 mcg/kg/min (07/15/16 0344)    PRN Medications: sodium chloride, acetaminophen, ALPRAZolam, fluticasone furoate-vilanterol, HYDROcodone-acetaminophen, ondansetron (ZOFRAN) IV, sodium chloride flush   Assessment/Plan   1. 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 AKI on CKD III 3. Paralyzed right hemidiaphragm 4. Hx of paraganglioma resection 5. Recent septic shock due to CAP admission 06/2016 6. OSA on BiPAP 7. NSVT -> VT  Coox stable on milrinone 0.125 mcg/kg/min. Prognosis guarded. If does not improve, there are few options left for him. Will likely at least need home inotrope support.   VT overnight improved with Electrolyte supp and decrease of milrinone. He was paced out of each episode.   Continue lasix 160 mg q 8 hrs. Gently supp K as needed with renal function. He is not currently a candidate for mechanical support with no good endpoint and compromised respiratory status with paralyzed R hemidiaphragm.   Plan to remove swan and replace with PICC line.   Patient is critically ill and remains in imminent danger of multiple organ failure.    Length of Stay: 2  Annamaria Helling  07/15/2016, 7:45 AM  Advanced Heart Failure Team Pager [phone] (M-F; 7a - 4p)  Please contact [facility] for night-coverage after hours (4p -7a ) and weekends on [email or web address]  Agree.   Remains very tenuous. Multiple runs of VT overnight requirng ATP. Magnesium supped. Milrinone decreased to 0.125  Swan numbers reviewed personally. Still volume overloaded. Diuresis is sluggish but seems to be  picking up. Lasix increased yesterday Will continue current dose.   Renal function recovering slightly.  On exam fatigued JVP elevated to ear COR tachy reg +s3 Lungs decreased at bases AB mildly distended NT Extremities warm mild edema  Multiple runs VT on tele (Personally reviewed)  Will continue inotropic support and IV diuresis. Watch rhythm and renal function carefully.   CRITICAL CARE Performed by: Glori Bickers  Total critical care time: 35 minutes  Critical care time was exclusive of separately billable procedures and treating other patients.  Critical care was necessary to treat or prevent imminent or life-threatening deterioration.  Critical care was time spent personally by me (independent of midlevel providers or  residents) on the following activities: development of treatment plan with patient and/or surrogate as well as nursing, discussions with consultants, evaluation of patient's response to treatment, examination of patient, obtaining history from patient or surrogate, ordering and performing treatments and interventions, ordering and review of laboratory studies, ordering and review of radiographic studies, pulse oximetry and re-evaluation of patient's condition.  Glori Bickers, MD  9:04 AM

## 2016-07-15 NOTE — Progress Notes (Signed)
2mg  Magnesium Sulfate ordered by Dr. Haroldine Laws. Will continue to monitor pt.

## 2016-07-15 NOTE — Progress Notes (Signed)
RN observed pt in VT again with rate >200. Pt instructed to cough and was paced out of rhythm. Pt is alert and states he "did not feel different like before". Cardiology Fellow notified and labs drawn to check [place] and K. Pt is currently resting comfortable in bed with bipap on, other vital signs stable. Will continue to monitor and report labs to cardiology.

## 2016-07-15 NOTE — Progress Notes (Signed)
  Peripherally Inserted Central Catheter/Midline Placement  The IV Nurse has discussed with the patient and/or persons authorized to consent for the patient, the purpose of this procedure and the potential benefits and risks involved with this procedure.  The benefits include less needle sticks, lab draws from the catheter, and the patient may be discharged home with the catheter. Risks include, but not limited to, infection, bleeding, blood clot (thrombus formation), and puncture of an artery; nerve damage and irregular heartbeat and possibility to perform a PICC exchange if needed/ordered by physician.  Alternatives to this procedure were also discussed.  Bard Power PICC patient education guide, fact sheet on infection prevention and patient information card has been provided to patient /or left at bedside.    PICC/Midline Placement Documentation  PICC Double Lumen 20/80/22 PICC Right Basilic 42 cm 0 cm (Active)  Indication for Insertion or Continuance of Line Vasoactive infusions 07/15/2016 12:00 PM  Exposed Catheter (cm) 0 cm 07/15/2016 12:00 PM  Dressing Change Due 07/22/16 07/15/2016 12:00 PM       Jule Economy Horton 07/15/2016, 12:51 PM

## 2016-07-15 NOTE — Care Management Note (Signed)
Case Management Note Marvetta Gibbons RN, BSN Unit 2W-Case Manager-- [place] coverage [phone]  Patient Details  Name: [name] MRN: [id number] Date of Birth: 1961/02/26  Subjective/Objective: Pt admitted from cath lab 07/13/16 with cardiogenic shock.  Required high dose IV lasix and milrinone - pt with acute on chronic systolic HF                   Action/Plan: PTA pt lived at home with wife- CM to follow for d/c needs- may need home inotrope therapy?  Expected Discharge Date:                  Expected Discharge Plan:  [place]  In-House Referral:     Discharge planning Services  CM Consult  Post Acute Care Choice:    Choice offered to:     DME Arranged:    DME Agency:     HH Arranged:    [place] Agency:     Status of Service:  In process, will continue to follow  If discussed at Long Length of Stay Meetings, dates discussed:    Discharge Disposition:   Additional Comments:  Dawayne Patricia, RN 07/15/2016, 11:47 AM

## 2016-07-16 LAB — CBC WITH DIFFERENTIAL/PLATELET
Basophils Absolute: 0 10*3/uL (ref 0.0–0.1)
Basophils Relative: 0 %
EOS ABS: 0.3 10*3/uL (ref 0.0–0.7)
EOS PCT: 4 %
HCT: 37.3 % — ABNORMAL LOW (ref 39.0–52.0)
Hemoglobin: 12.4 g/dL — ABNORMAL LOW (ref 13.0–17.0)
LYMPHS ABS: 1.6 10*3/uL (ref 0.7–4.0)
Lymphocytes Relative: 23 %
MCH: 30 pg (ref 26.0–34.0)
MCHC: 33.2 g/dL (ref 30.0–36.0)
MCV: 90.3 fL (ref 78.0–100.0)
MONO ABS: 0.7 10*3/uL (ref 0.1–1.0)
MONOS PCT: 10 %
Neutro Abs: 4.2 10*3/uL (ref 1.7–7.7)
Neutrophils Relative %: 63 %
PLATELETS: 142 10*3/uL — AB (ref 150–400)
RBC: 4.13 MIL/uL — ABNORMAL LOW (ref 4.22–5.81)
RDW: 14.8 % (ref 11.5–15.5)
WBC: 6.8 10*3/uL (ref 4.0–10.5)

## 2016-07-16 LAB — BASIC METABOLIC PANEL
Anion gap: 11 (ref 5–15)
BUN: 42 mg/dL — AB (ref 6–20)
CHLORIDE: 92 mmol/L — AB (ref 101–111)
CO2: 31 mmol/L (ref 22–32)
Calcium: 9.3 mg/dL (ref 8.9–10.3)
Creatinine, Ser: 2.73 mg/dL — ABNORMAL HIGH (ref 0.61–1.24)
GFR calc Af Amer: 28 mL/min — ABNORMAL LOW (ref >60)
GFR calc non Af Amer: 24 mL/min — ABNORMAL LOW (ref >60)
Glucose, Bld: 110 mg/dL — ABNORMAL HIGH (ref 65–99)
Potassium: 4.3 mmol/L (ref 3.5–5.1)
SODIUM: 134 mmol/L — AB (ref 135–145)

## 2016-07-16 LAB — COOXEMETRY PANEL
Carboxyhemoglobin: 1.4 % (ref 0.5–1.5)
Methemoglobin: 0.8 % (ref 0.0–1.5)
O2 Saturation: 68.1 %
TOTAL HEMOGLOBIN: 16.3 g/dL — AB (ref 12.0–16.0)

## 2016-07-16 LAB — MAGNESIUM: Magnesium: 1.8 mg/dL (ref 1.7–2.4)

## 2016-07-16 MED ORDER — ORAL CARE MOUTH RINSE
15.0000 mL | Freq: Two times a day (BID) | OROMUCOSAL | Status: DC
  Administered 2016-07-16 – 2016-07-17 (×3): 15 mL via OROMUCOSAL

## 2016-07-16 MED ORDER — MAGNESIUM SULFATE 2 GM/50ML IV SOLN
2.0000 g | Freq: Once | INTRAVENOUS | Status: AC
  Administered 2016-07-16: 2 g via INTRAVENOUS
  Filled 2016-07-16: qty 50

## 2016-07-16 NOTE — Progress Notes (Signed)
Advanced Heart Failure Rounding Note   Primary Cardiologist: Dr. Haroldine Laws   Subjective:    Admitted from cath lab 07/13/16 with cardiogenic shock.  Required high dose IV lasix and milrinone up to 0.375 mcg/kg/min  Had multiple episodes of VT on 5/9 so milrinone cut back to 0.125. Remains on lasix 160 IV q8  Swan pulled yesterday. PICC placed   Co-ox 68%  Creatinine improving slowly. No further VT. Weight down 7 pounds total. CVP 9  Breathing better. No CP.   Objective:   Weight Range: 95.8 kg (211 lb 1.6 oz) Body mass index is 30.29 kg/m.   Vital Signs:   Temp:  [97.7 F (36.5 C)-99.2 F (37.3 C)] 97.7 F (36.5 C) (05/12 0337) Pulse Rate:  [49-126] 102 (05/12 0700) Resp:  [13-34] 22 (05/12 0700) BP: (105-146)/(62-109) 113/62 (05/12 0700) SpO2:  [94 %-100 %] 97 % (05/12 0700) Weight:  [95.8 kg (211 lb 1.6 oz)] 95.8 kg (211 lb 1.6 oz) (05/12 0420) Last BM Date: 07/15/16  Weight change: Filed Weights   07/14/16 0600 07/15/16 0500 07/16/16 0420  Weight: 97.8 kg (215 lb 9.8 oz) 96.8 kg (213 lb 6.5 oz) 95.8 kg (211 lb 1.6 oz)    Intake/Output:   Intake/Output Summary (Last 24 hours) at 07/16/16 0818 Last data filed at 07/16/16 0600  Gross per 24 hour  Intake              549 ml  Output             1325 ml  Net             -776 ml     Physical Exam: General:  Fatigued appearing. No resp difficulty HEENT: normal Neck: supple. JVP 8-9 Carotids 2+ bilat; no bruits. No lymphadenopathy or thryomegaly appreciated. Cor: PMI laterally displaced. Tachy regular +s3 Lungs: clear  Abdomen: obese soft, nontender, nondistended. No hepatosplenomegaly. No bruits or masses. Good bowel sounds. Extremities: no cyanosis, clubbing, rash, edema Neuro: alert & orientedx3, cranial nerves grossly intact. moves all 4 extremities w/o difficulty. Affect pleasant   Telemetry: NSR occasional PVCs. No NSVT. Personally reviewed   Labs: CBC  Recent Labs  07/15/16 0500  07/16/16 0455  WBC 7.1 6.8  NEUTROABS 4.8 4.2  HGB 11.8* 12.4*  HCT 35.8* 37.3*  MCV 89.5 90.3  PLT 139* 330*   Basic Metabolic Panel  Recent Labs  07/15/16 0038  07/15/16 1633 07/16/16 0455  NA 133*  < > 133* 134*  K 4.1  < > 4.3 4.3  CL 95*  < > 94* 92*  CO2 29  < > 30 31  GLUCOSE 139*  < > 128* 110*  BUN 48*  < > 43* 42*  CREATININE 3.06*  < > 2.79* 2.73*  CALCIUM 8.9  < > 9.2 9.3  MG 2.3  --  2.0 1.8  PHOS 3.8  --   --   --   < > = values in this interval not displayed. Liver Function Tests  Recent Labs  07/14/16 1928 07/15/16 0038  AST 24 19  ALT 25 23  ALKPHOS 81 74  BILITOT 1.3* 1.0  PROT 6.7 6.3*  ALBUMIN 3.5 3.2*   No results for input(s): LIPASE, AMYLASE in the last 72 hours. Cardiac Enzymes No results for input(s): CKTOTAL, CKMB, CKMBINDEX, TROPONINI in the last 72 hours.  BNP: BNP (last 3 results)  Recent Labs  06/06/16 1645 06/06/16 1925 07/07/16 0929  BNP 796.0* 553.0* 932.0*  ProBNP (last 3 results) No results for input(s): PROBNP in the last 8760 hours.   D-Dimer No results for input(s): DDIMER in the last 72 hours. Hemoglobin A1C No results for input(s): HGBA1C in the last 72 hours. Fasting Lipid Panel No results for input(s): CHOL, HDL, LDLCALC, TRIG, CHOLHDL, LDLDIRECT in the last 72 hours. Thyroid Function Tests  Recent Labs  07/13/16 1225  TSH 2.212    Other results:     Imaging/Studies:  Dg Chest Port 1 View  Result Date: 07/15/2016 CLINICAL DATA:  Status post right-sided PICC line placement EXAM: PORTABLE CHEST 1 VIEW COMPARISON:  07/14/2016 FINDINGS: Cardiac shadow remains enlarged. Defibrillator is again seen. The right-sided PICC line is noted with the catheter tip in the mid right atrium. This appears stable from the prior exam. Mild right basilar atelectasis remains with small pleural effusion. IMPRESSION: Stable appearing chest. Right PICC line lie is in the mid right atrium. This could be withdrawn  approximately 3 cm. Electronically Signed   By: Inez Catalina M.D.   On: 07/15/2016 14:29      Medications:     Scheduled Medications: . atorvastatin  40 mg Oral QHS  . Chlorhexidine Gluconate Cloth  6 each Topical Q0600  . Chlorhexidine Gluconate Cloth  6 each Topical Daily  . enoxaparin (LOVENOX) injection  30 mg Subcutaneous Q24H  . febuxostat  40 mg Oral Daily  . loratadine  10 mg Oral Daily  . magnesium oxide  400 mg Oral BID  . mouth rinse  15 mL Mouth Rinse BID  . mupirocin ointment  1 application Nasal BID  . pantoprazole  40 mg Oral Daily  . sodium chloride flush  10-40 mL Intracatheter Q12H  . sodium chloride flush  10-40 mL Intracatheter Q12H  . tobramycin-dexamethasone  1 drop Right Eye BID    Infusions: . sodium chloride 250 mL (07/16/16 0000)  . sodium chloride Stopped (07/14/16 0800)  . furosemide 160 mg (07/16/16 0606)  . milrinone 0.125 mcg/kg/min (07/15/16 1600)    PRN Medications: sodium chloride, acetaminophen, ALPRAZolam, fluticasone furoate-vilanterol, HYDROcodone-acetaminophen, ondansetron (ZOFRAN) IV, sodium chloride flush, sodium chloride flush   Assessment/Plan   1. 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 AKI on CKD III 3. Paralyzed right hemidiaphragm 4. Hx of paraganglioma resection 5. Recent septic shock due to CAP admission 06/2016 6. OSA on BiPAP 7. NSVT -> VT  Coox improved on milrinone 0.125 mcg/kg/min. CVP now down to 9. Will continue IV diuresis one more day. Continue milrinone support.   VT improved. Continue to watch electrolytes.   Now that swan out can go to SDU  Long talk about long-term options. With CKD and other comorbidities no good candidate for transplant or VAD. Right now I think only option is home inotropes unless creatinine improves. Creatinine has typically remained > 2.0 (was 1.8 in 4/18 briefly). Will continue to follow closely.   Ambulate with PT/CR.  Length of Stay: 3  Glori Bickers, MD   07/16/2016, 8:18 AM  Advanced Heart Failure Team Pager [phone] (M-F; 7a - 4p)  Please contact [facility] for night-coverage after hours (4p -7a ) and weekends on [email or web address]

## 2016-07-16 NOTE — Plan of Care (Signed)
Problem: Fluid Volume: Goal: Ability to maintain a balanced intake and output will improve Outcome: Progressing Diuresing on lasix  Problem: Bowel/Gastric: Goal: Will not experience complications related to bowel motility Outcome: Progressing Had BM today

## 2016-07-16 NOTE — Progress Notes (Signed)
Patient placed on CPAP QHS and doing well. 2l O2 bleed in and patient RR 22, Sats 95

## 2016-07-17 LAB — CBC WITH DIFFERENTIAL/PLATELET
Basophils Absolute: 0 10*3/uL (ref 0.0–0.1)
Basophils Relative: 0 %
EOS ABS: 0.4 10*3/uL (ref 0.0–0.7)
Eosinophils Relative: 6 %
HCT: 38.2 % — ABNORMAL LOW (ref 39.0–52.0)
HEMOGLOBIN: 12.8 g/dL — AB (ref 13.0–17.0)
LYMPHS ABS: 1.7 10*3/uL (ref 0.7–4.0)
Lymphocytes Relative: 23 %
MCH: 30.1 pg (ref 26.0–34.0)
MCHC: 33.5 g/dL (ref 30.0–36.0)
MCV: 89.9 fL (ref 78.0–100.0)
Monocytes Absolute: 0.6 10*3/uL (ref 0.1–1.0)
Monocytes Relative: 8 %
NEUTROS PCT: 63 %
Neutro Abs: 4.8 10*3/uL (ref 1.7–7.7)
Platelets: 152 10*3/uL (ref 150–400)
RBC: 4.25 MIL/uL (ref 4.22–5.81)
RDW: 14.7 % (ref 11.5–15.5)
WBC: 7.6 10*3/uL (ref 4.0–10.5)

## 2016-07-17 LAB — BASIC METABOLIC PANEL
Anion gap: 10 (ref 5–15)
BUN: 43 mg/dL — ABNORMAL HIGH (ref 6–20)
CHLORIDE: 89 mmol/L — AB (ref 101–111)
CO2: 32 mmol/L (ref 22–32)
CREATININE: 2.93 mg/dL — AB (ref 0.61–1.24)
Calcium: 9.2 mg/dL (ref 8.9–10.3)
GFR calc Af Amer: 26 mL/min — ABNORMAL LOW (ref >60)
GFR calc non Af Amer: 22 mL/min — ABNORMAL LOW (ref >60)
GLUCOSE: 132 mg/dL — AB (ref 65–99)
Potassium: 4.4 mmol/L (ref 3.5–5.1)
Sodium: 131 mmol/L — ABNORMAL LOW (ref 135–145)

## 2016-07-17 LAB — COOXEMETRY PANEL
Carboxyhemoglobin: 1.4 % (ref 0.5–1.5)
Methemoglobin: 1 % (ref 0.0–1.5)
O2 Saturation: 67.6 %
TOTAL HEMOGLOBIN: 12.8 g/dL (ref 12.0–16.0)

## 2016-07-17 NOTE — Progress Notes (Signed)
Advanced Heart Failure Rounding Note   Primary Cardiologist: Dr. Haroldine Laws   Subjective:    Admitted from cath lab 07/13/16 with cardiogenic shock.  Required high dose IV lasix and milrinone up to 0.375 mcg/kg/min  Had multiple episodes of VT on 5/9 so milrinone cut back to 0.125.   Remains on milrinone 0.125 and IV lasix. Feels ok. Fatigued. No SOB, orthopnea or PND. No CP.  CVP 2. Creatinine up to 2.9  Several brief runs NSVT with occasioanl ATP.   Objective:   Weight Range: 95.3 kg (210 lb) Body mass index is 30.13 kg/m.   Vital Signs:   Temp:  [97.5 F (36.4 C)-98.6 F (37 C)] 98.2 F (36.8 C) (05/13 1100) Pulse Rate:  [56-110] 110 (05/13 1100) Resp:  [14-29] 19 (05/13 1100) BP: (68-120)/(18-102) 97/81 (05/13 1100) SpO2:  [90 %-99 %] 95 % (05/13 1100) Weight:  [95.3 kg (210 lb)] 95.3 kg (210 lb) (05/13 0500) Last BM Date: 07/16/16  Weight change: Filed Weights   07/15/16 0500 07/16/16 0420 07/17/16 0500  Weight: 96.8 kg (213 lb 6.5 oz) 95.8 kg (211 lb 1.6 oz) 95.3 kg (210 lb)    Intake/Output:   Intake/Output Summary (Last 24 hours) at 07/17/16 1109 Last data filed at 07/17/16 0600  Gross per 24 hour  Intake            676.9 ml  Output             1650 ml  Net           -973.1 ml     Physical Exam: General:  Fatigued appearing. Sittingin chair No resp difficulty HEENT: normal Neck: supple. JVP flat Carotids 2+ bilat; no bruits. No lymphadenopathy or thryomegaly appreciated. Cor: PMI laterally displaced. Tachy regular +s3 Lungs: clear markedly decreased in R base  Abdomen: obese soft,NT/ND good BS Extremities: no cyanosis, clubbing, rash, edema  cool Neuro: alert & orientedx3, cranial nerves grossly intact. moves all 4 extremities w/o difficulty. Affect pleasant   Telemetry: ST 100-120 with LV pacing. Occasional NSVT/VT with ATP. Personally reviewed    Labs: CBC  Recent Labs  07/16/16 0455 07/17/16 0324  WBC 6.8 7.6  NEUTROABS 4.2 4.8    HGB 12.4* 12.8*  HCT 37.3* 38.2*  MCV 90.3 89.9  PLT 142* 419   Basic Metabolic Panel  Recent Labs  07/15/16 0038  07/15/16 1633 07/16/16 0455 07/17/16 0324  NA 133*  < > 133* 134* 131*  K 4.1  < > 4.3 4.3 4.4  CL 95*  < > 94* 92* 89*  CO2 29  < > 30 31 32  GLUCOSE 139*  < > 128* 110* 132*  BUN 48*  < > 43* 42* 43*  CREATININE 3.06*  < > 2.79* 2.73* 2.93*  CALCIUM 8.9  < > 9.2 9.3 9.2  MG 2.3  --  2.0 1.8  --   PHOS 3.8  --   --   --   --   < > = values in this interval not displayed. Liver Function Tests  Recent Labs  07/14/16 1928 07/15/16 0038  AST 24 19  ALT 25 23  ALKPHOS 81 74  BILITOT 1.3* 1.0  PROT 6.7 6.3*  ALBUMIN 3.5 3.2*   No results for input(s): LIPASE, AMYLASE in the last 72 hours. Cardiac Enzymes No results for input(s): CKTOTAL, CKMB, CKMBINDEX, TROPONINI in the last 72 hours.  BNP: BNP (last 3 results)  Recent Labs  06/06/16 1645 06/06/16 1925  07/07/16 0929  BNP 796.0* 553.0* 932.0*    ProBNP (last 3 results) No results for input(s): PROBNP in the last 8760 hours.   D-Dimer No results for input(s): DDIMER in the last 72 hours. Hemoglobin A1C No results for input(s): HGBA1C in the last 72 hours. Fasting Lipid Panel No results for input(s): CHOL, HDL, LDLCALC, TRIG, CHOLHDL, LDLDIRECT in the last 72 hours. Thyroid Function Tests No results for input(s): TSH, T4TOTAL, T3FREE, THYROIDAB in the last 72 hours.  Invalid input(s): FREET3  Other results:     Imaging/Studies:  No results found.    Medications:     Scheduled Medications: . atorvastatin  40 mg Oral QHS  . Chlorhexidine Gluconate Cloth  6 each Topical Q0600  . Chlorhexidine Gluconate Cloth  6 each Topical Daily  . enoxaparin (LOVENOX) injection  30 mg Subcutaneous Q24H  . febuxostat  40 mg Oral Daily  . loratadine  10 mg Oral Daily  . magnesium oxide  400 mg Oral BID  . mouth rinse  15 mL Mouth Rinse BID  . mupirocin ointment  1 application Nasal BID  .  pantoprazole  40 mg Oral Daily  . sodium chloride flush  10-40 mL Intracatheter Q12H  . sodium chloride flush  10-40 mL Intracatheter Q12H  . tobramycin-dexamethasone  1 drop Right Eye BID    Infusions: . sodium chloride 250 mL (07/16/16 2200)  . sodium chloride Stopped (07/14/16 0800)  . furosemide Stopped (07/17/16 0610)  . milrinone 0.125 mcg/kg/min (07/16/16 2200)    PRN Medications: sodium chloride, acetaminophen, ALPRAZolam, fluticasone furoate-vilanterol, HYDROcodone-acetaminophen, ondansetron (ZOFRAN) IV, sodium chloride flush, sodium chloride flush   Assessment/Plan   1. 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 AKI on CKD III 3. Paralyzed right hemidiaphragm 4. Hx of paraganglioma resection 5. Recent septic shock due to CAP admission 06/2016 6. OSA on BiPAP 7. NSVT -> VT  Remains very tenuous with end-stage HF. Co-ox improved with IV milrinone but very tachycardic and prominent S3.  CVP down to 2. Renal function getting worse.  Will stop lasix. Continue milrinone .   Long talk about long-term options with him and his son today. With CKD and other comorbidities likely not good candidate for transplant or VAD. We did discuss option of dual organ transplant but I am not sure he will qualify.  Right now I think only option is home inotropes unless creatinine improves. Creatinine has typically remained > 2.0 (was 1.8 in 4/18 briefly).   Will continue milrinone and plan home support. Will interrogate ICD in am and make sure underlying rhythm is still sinus. Will refer to [facility] after d/c for second opinion on advanced therapies.   Total time spent 45 minutes. Over half that time spent discussing above.    Ambulate with PT/CR.  Length of Stay: 4  Glori Bickers, MD  07/17/2016, 11:09 AM  Advanced Heart Failure Team Pager [phone] (M-F; 7a - 4p)  Please contact [facility] for night-coverage after hours (4p -7a ) and weekends on [email or web address]

## 2016-07-18 LAB — CBC WITH DIFFERENTIAL/PLATELET
BASOS ABS: 0 10*3/uL (ref 0.0–0.1)
BASOS PCT: 0 %
EOS ABS: 0.4 10*3/uL (ref 0.0–0.7)
Eosinophils Relative: 5 %
HCT: 39 % (ref 39.0–52.0)
HEMOGLOBIN: 12.9 g/dL — AB (ref 13.0–17.0)
Lymphocytes Relative: 23 %
Lymphs Abs: 1.7 10*3/uL (ref 0.7–4.0)
MCH: 29.7 pg (ref 26.0–34.0)
MCHC: 33.1 g/dL (ref 30.0–36.0)
MCV: 89.9 fL (ref 78.0–100.0)
MONOS PCT: 8 %
Monocytes Absolute: 0.6 10*3/uL (ref 0.1–1.0)
NEUTROS PCT: 64 %
Neutro Abs: 4.9 10*3/uL (ref 1.7–7.7)
Platelets: 167 10*3/uL (ref 150–400)
RBC: 4.34 MIL/uL (ref 4.22–5.81)
RDW: 14.4 % (ref 11.5–15.5)
WBC: 7.7 10*3/uL (ref 4.0–10.5)

## 2016-07-18 LAB — BASIC METABOLIC PANEL
Anion gap: 9 (ref 5–15)
BUN: 43 mg/dL — ABNORMAL HIGH (ref 6–20)
CALCIUM: 9.2 mg/dL (ref 8.9–10.3)
CO2: 35 mmol/L — AB (ref 22–32)
CREATININE: 2.82 mg/dL — AB (ref 0.61–1.24)
Chloride: 88 mmol/L — ABNORMAL LOW (ref 101–111)
GFR, EST AFRICAN AMERICAN: 27 mL/min — AB (ref >60)
GFR, EST NON AFRICAN AMERICAN: 23 mL/min — AB (ref >60)
Glucose, Bld: 106 mg/dL — ABNORMAL HIGH (ref 65–99)
Potassium: 4.6 mmol/L (ref 3.5–5.1)
SODIUM: 132 mmol/L — AB (ref 135–145)

## 2016-07-18 LAB — COOXEMETRY PANEL
CARBOXYHEMOGLOBIN: 1.4 % (ref 0.5–1.5)
Methemoglobin: 0.9 % (ref 0.0–1.5)
O2 SAT: 64.7 %
TOTAL HEMOGLOBIN: 13 g/dL (ref 12.0–16.0)

## 2016-07-18 LAB — MAGNESIUM: MAGNESIUM: 2 mg/dL (ref 1.7–2.4)

## 2016-07-18 MED ORDER — IVABRADINE HCL 5 MG PO TABS
2.5000 mg | ORAL_TABLET | Freq: Two times a day (BID) | ORAL | Status: DC
  Administered 2016-07-18 – 2016-07-19 (×2): 2.5 mg via ORAL
  Filled 2016-07-18 (×2): qty 1

## 2016-07-18 NOTE — Discharge Summary (Signed)
Advanced Heart Failure Discharge Note  Discharge Summary   Patient ID: [name] MRN: [id number], DOB/AGE: 1960/09/27 56 y.o. Admit date: 07/13/2016 D/C date:     07/19/2016   Primary Discharge Diagnoses:  1. 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 AKI on CKD III 3. Paralyzed right hemidiaphragm 4. Hx of paraganglioma resection 5. Recent septic shock due to CAP admission 06/2016 6. OSA on BiPAP 7. 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   Admitted 06/06/2016 with acute resp failure requiring intubation 2/2 to volume overload and septic shock. BCx 1/2 + for strep viridans. No endocarditis or vegetation noted on TEE 06/15/16. Extubated 06/18/16. 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 Diuresed from 245 to 224 on discharge. Spent nearly a week in CIR prior to d/c. Discharge weight was 224 lbs.   Last seen in HF clinic 07/07/16. Was slowly improving with rehab but continued with NYHA IIIb symptoms, orthopnea, and poor appetite.  Hypotensive.  With this constellation of symptoms, RHC planned.   Right heart cath was preformed on 07/13/16, Pt was found to have profoundly low cardiac output with cardiogenic shock as below. Admission weight 218.   He was admitted and started on 0.25 mcg of milrinone. Co ox improved from 41% to 65%, but did require high dose milrinone at 0.375 mcg. He was diuresed with 160mg  IV lasix q 8 hours. His milrinone was decreased to 0.125 mcg as he had several runs of NSVT/VT. His ICD was interrogated - Pt has had 9 episodes of VT with successful ATP from 5/10 -> 07/16/16.  He had several episodes of  NSVT/VT 07/17/16 that did NOT require ATP.   Overall pt diuresed 6 L, though weights only showed down 2 lbs from admit. Hydralazine held. No room to add ACE/ARB/Digoxin or Arlyce Harman.  Pt examined on am of 07/19/16 and determined to be stable for discharge. CVP 4-5. Medications adjusted for home.  He will be discharged to home in stable but tenuous condition with close follow up as below. Electrolytes stable on day of discharge.     RHC 07/13/2016 Hemodynamics (mmHg) RA mean 19 RV 85/18 PA 82/44 PCWP 36 AO 97% Cardiac Output (Fick) 2.79 Cardiac Index (Fick) 1.29  Discharge Weight:  Discharge Vitals: Blood pressure 110/80, pulse 100, temperature 97.8 F (36.6 C), temperature source Oral, resp. rate (!) 23, height 5\' 10"  (1.778 m), weight 213 lb 14.4 oz (97 kg), SpO2 98 %.  Labs: Lab Results  Component Value Date   WBC 7.8 07/19/2016   HGB 12.2 (L) 07/19/2016   HCT 37.4 (L) 07/19/2016   MCV 89.9 07/19/2016   PLT 174 07/19/2016    Recent Labs Lab 07/15/16 0038  07/19/16 0515  NA 133*  < > 131*  K 4.1  < > 4.3  CL 95*  < > 90*  CO2 29  < > 33*  BUN 48*  < > 43*  CREATININE 3.06*  < > 2.71*  CALCIUM 8.9  < > 9.0  PROT 6.3*  --   --   BILITOT 1.0  --   --   ALKPHOS 74  --   --   ALT 23  --   --  AST 19  --   --   GLUCOSE 139*  < > 123*  < > = values in this interval not displayed. Lab Results  Component Value Date   CHOL  12/11/2006    123        ATP III CLASSIFICATION:  <200     mg/dL   Desirable  200-239  mg/dL   Borderline High  >=240    mg/dL   High   HDL 20 (L) 12/11/2006   LDLCALC  12/11/2006    75        Total Cholesterol/HDL:CHD Risk Coronary Heart Disease Risk Table                     Men   Women  1/2 Average Risk   3.4   3.3   TRIG 115 06/08/2016   BNP (last 3 results)  Recent Labs  06/06/16 1645 06/06/16 1925 07/07/16 0929  BNP 796.0* 553.0* 932.0*    ProBNP (last 3 results) No results for input(s): PROBNP in the last 8760  hours.   Diagnostic Studies/Procedures   No results found.  Discharge Medications   Allergies as of 07/19/2016      Reactions   Bidil [isosorb Dinitrate-hydralazine] Other (See Comments)   Headaches   Spironolactone Other (See Comments)   Hyperkalemia, headache       Medication List    STOP taking these medications   hydrALAZINE 10 MG tablet Commonly known as:  APRESOLINE   predniSONE 10 MG (48) Tbpk tablet Commonly known as:  STERAPRED UNI-PAK 48 TAB     TAKE these medications   ALPRAZolam 0.25 MG tablet Commonly known as:  XANAX Take 1-2 tablets (0.25-0.5 mg total) by mouth 3 (three) times daily as needed for anxiety.   atorvastatin 40 MG tablet Commonly known as:  LIPITOR Take 1 tablet (40 mg total) by mouth at bedtime.   BREO ELLIPTA 100-25 MCG/INH Aepb Generic drug:  fluticasone furoate-vilanterol Inhale 1 puff into the lungs daily as needed (for shortness of breath).   cefdinir 300 MG capsule Commonly known as:  OMNICEF Take 300 mg by mouth 2 (two) times daily.   colchicine 0.6 MG tablet Take 1 tablet (0.6 mg total) by mouth daily.   MITIGARE 0.6 MG Caps Generic drug:  Colchicine Take 0.6 mg by mouth daily.   febuxostat 40 MG tablet Commonly known as:  ULORIC Take 40 mg by mouth daily.   HYDROcodone-acetaminophen 5-325 MG tablet Commonly known as:  NORCO/VICODIN Take 0.5-1 tablets by mouth every 6 (six) hours as needed for moderate pain.   ivabradine 5 MG Tabs tablet Commonly known as:  CORLANOR Take 1 tablet (5 mg total) by mouth 2 (two) times daily with a meal.   loratadine 10 MG tablet Commonly known as:  CLARITIN Take 1 tablet (10 mg total) by mouth daily.   Magnesium 400 MG Tabs Take 400 mg by mouth 2 (two) times daily. What changed:  how much to take   milrinone 20 MG/100 ML Soln infusion Commonly known as:  PRIMACOR Inject 12.3625 mcg/min into the vein continuous.   omeprazole 40 MG capsule Commonly known as:  PRILOSEC Take 40  mg by mouth daily as needed (acid reflux).   tobramycin-dexamethasone ophthalmic solution Commonly known as:  TOBRADEX Place 1 drop into the right eye 2 (two) times daily.   torsemide 20 MG tablet Commonly known as:  DEMADEX One tablet (20 mg) on Monday Wednesdays and Fridays. May take extra  pill on off days for weight gain or swelling. What changed:  additional instructions  Another medication with the same name was removed. Continue taking this medication, and follow the directions you see here.            Durable Medical Equipment        Start     Ordered   07/18/16 1432  Heart failure home health orders  (Heart failure home health orders / Face to face)  Once    Comments:  Heart Failure Follow-up Care:  Verify follow-up appointments per Patient Discharge Instructions. Confirm transportation arranged. Reconcile home medications with discharge medication list. Remove discontinued medications from use. Assist patient/caregiver to manage medications using pill box. Reinforce low sodium food selection Assessments: Vital signs and oxygen saturation at each visit. Assess home environment for safety concerns, caregiver support and availability of low-sodium foods. Consult Education officer, museum, PT/OT, Dietitian, and CNA based on assessments. Perform comprehensive cardiopulmonary assessment. Notify MD for any change in condition or weight gain of 3 pounds in one day or 5 pounds in one week with symptoms. Daily Weights and Symptom Monitoring: Ensure patient has access to scales. Teach patient/caregiver to weigh daily before breakfast and after voiding using same scale and record.    Teach patient/caregiver to track weight and symptoms and when to notify Provider. Activity: Develop individualized activity plan with patient/caregiver.   Milrinone 0.125 mcg/kg/min x 12 months per [place].  Question Answer Comment  Heart Failure Follow-up Care Advanced Heart Failure  (AHF) [facility] at [place] Visits Set up telemonitoring equipment to monitor daily vital signs, weights and oxygen saturation   Obtain the following labs Basic Metabolic Panel   Lab frequency Weekly   Fax lab results to AHF Clinic at [phone]   Diet Low Sodium Heart Healthy   Fluid restrictions: 2000 mL Fluid   Skilled Nurse to notify MD of weight trends weekly for first 2 weeks. May fax or call: AHF Clinic at [phone] (fax) or [facility] Diuretic Protocol to be used by [place] only ( to be ordered by Heart Failure Team Providers Only) Yes      07/18/16 1433      Disposition   The patient will be discharged in stable condition to home. Discharge Instructions    (HEART FAILURE PATIENTS) Call MD:  Anytime you have any of the following symptoms: 1) 3 pound weight gain in 24 hours or 5 pounds in 1 week 2) shortness of breath, with or without a dry hacking cough 3) swelling in the hands, feet or stomach 4) if you have to sleep on extra pillows at night in order to breathe.    Complete by:  As directed    Diet - low sodium heart healthy    Complete by:  As directed    Heart Failure patients record your daily weight using the same scale at the same time of day    Complete by:  As directed    Increase activity slowly    Complete by:  As directed    STOP any activity that causes chest pain, shortness of breath, dizziness, sweating, or exessive weakness    Complete by:  As directed      Follow-up Information    Kora Groom, Shaune Pascal, MD Follow up on 07/26/2016.   Specialty:  Cardiology Why:  at 10:00 [id number] Contact information: [place]  [place] [address] [phone]             Duration of Discharge Encounter: Greater than 35 minutes   Signed, Annamaria Helling 07/19/2016, 8:48 AM   Patient seen and examined with the above-signed Advanced Practice Provider and/or  Housestaff. I personally reviewed laboratory data, imaging studies and relevant notes. I independently examined the patient and formulated the important aspects of the plan. I have edited the note to reflect any of my changes or salient points. I have personally discussed the plan with the patient and/or family.  Remains very tenuous but improved on milrinone. Will arrange for home milrinone. Will set up for evaluation at [facility] for second opinion on candidacy for advanced therapies.   Glori Bickers, MD  12:27 AM  .

## 2016-07-18 NOTE — Progress Notes (Signed)
Advanced Heart Failure Rounding Note   Primary Cardiologist: Dr. Haroldine Laws   Subjective:    Admitted from cath lab 07/13/16 with cardiogenic shock.  Required high dose IV lasix and milrinone up to 0.375 mcg/kg/min  Had multiple episodes of VT on 5/9 so milrinone cut back to 0.125.   Coox 64.7% on milrinone 0.125 mcg/kg/min. Lasix held yesterday with low CVP and creatinine climbing.   Feeling OK this am. Denies SOB. Less fatigued. Good urine output.  Having several brief runs of NSVT with occasional ATP. ICD interrogation pending for full count.   Objective:   Weight Range: 211 lb (95.7 kg) Body mass index is 30.28 kg/m.   Vital Signs:   Temp:  [97 F (36.1 C)-98.5 F (36.9 C)] 97 F (36.1 C) (05/14 0303) Pulse Rate:  [54-110] 102 (05/14 0400) Resp:  [15-25] 16 (05/14 0400) BP: (89-121)/(62-87) 101/70 (05/14 0400) SpO2:  [92 %-97 %] 92 % (05/14 0400) Weight:  [211 lb (95.7 kg)] 211 lb (95.7 kg) (05/14 0303) Last BM Date: 07/17/16  Weight change: Filed Weights   07/16/16 0420 07/17/16 0500 07/18/16 0303  Weight: 211 lb 1.6 oz (95.8 kg) 210 lb (95.3 kg) 211 lb (95.7 kg)    Intake/Output:   Intake/Output Summary (Last 24 hours) at 07/18/16 0734 Last data filed at 07/17/16 2359  Gross per 24 hour  Intake           192.42 ml  Output              700 ml  Net          -507.58 ml     Physical Exam: General: Fatigued appearing. NAD  HEENT: normal Neck: supple. JVP flat. Carotids 2+ bilat; no bruits. No thyromegaly or nodule noted. Cor: PMI lateral. Tachy. +S3 Lungs: Markedly decrease R base, otherwise clear Abdomen: soft, non-tender, distended, no HSM. No bruits or masses. +BS  Extremities: no cyanosis, clubbing, rash, R and LLE no edema. Cool to the touch Neuro: alert & orientedx3, cranial nerves grossly intact. moves all 4 extremities w/o difficulty. Affect pleasant   Telemetry: Personally reviewed, Wide ST 100-120s with LV pacing. Occasional NSVT/VT with  ATP.    Labs: CBC  Recent Labs  07/17/16 0324 07/18/16 0625  WBC 7.6 7.7  NEUTROABS 4.8 4.9  HGB 12.8* 12.9*  HCT 38.2* 39.0  MCV 89.9 89.9  PLT 152 323   Basic Metabolic Panel  Recent Labs  07/16/16 0455 07/17/16 0324 07/18/16 0625  NA 134* 131* 132*  K 4.3 4.4 4.6  CL 92* 89* 88*  CO2 31 32 35*  GLUCOSE 110* 132* 106*  BUN 42* 43* 43*  CREATININE 2.73* 2.93* 2.82*  CALCIUM 9.3 9.2 9.2  MG 1.8  --  2.0   Liver Function Tests No results for input(s): AST, ALT, ALKPHOS, BILITOT, PROT, ALBUMIN in the last 72 hours. No results for input(s): LIPASE, AMYLASE in the last 72 hours. Cardiac Enzymes No results for input(s): CKTOTAL, CKMB, CKMBINDEX, TROPONINI in the last 72 hours.  BNP: BNP (last 3 results)  Recent Labs  06/06/16 1645 06/06/16 1925 07/07/16 0929  BNP 796.0* 553.0* 932.0*    ProBNP (last 3 results) No results for input(s): PROBNP in the last 8760 hours.   D-Dimer No results for input(s): DDIMER in the last 72 hours. Hemoglobin A1C No results for input(s): HGBA1C in the last 72 hours. Fasting Lipid Panel No results for input(s): CHOL, HDL, LDLCALC, TRIG, CHOLHDL, LDLDIRECT in the last 72  hours. Thyroid Function Tests No results for input(s): TSH, T4TOTAL, T3FREE, THYROIDAB in the last 72 hours.  Invalid input(s): FREET3  Other results:     Imaging/Studies:  No results found.    Medications:     Scheduled Medications: . atorvastatin  40 mg Oral QHS  . Chlorhexidine Gluconate Cloth  6 each Topical Q0600  . enoxaparin (LOVENOX) injection  30 mg Subcutaneous Q24H  . febuxostat  40 mg Oral Daily  . loratadine  10 mg Oral Daily  . magnesium oxide  400 mg Oral BID  . pantoprazole  40 mg Oral Daily  . sodium chloride flush  10-40 mL Intracatheter Q12H  . tobramycin-dexamethasone  1 drop Right Eye BID    Infusions: . sodium chloride 250 mL (07/16/16 2200)  . sodium chloride Stopped (07/14/16 0800)  . milrinone 0.125  mcg/kg/min (07/17/16 1317)    PRN Medications: sodium chloride, acetaminophen, ALPRAZolam, fluticasone furoate-vilanterol, HYDROcodone-acetaminophen, ondansetron (ZOFRAN) IV, sodium chloride flush, sodium chloride flush   Assessment/Plan   1. 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 AKI on CKD III 3. Paralyzed right hemidiaphragm 4. Hx of paraganglioma resection 5. Recent septic shock due to CAP admission 06/2016 6. OSA on BiPAP 7. NSVT -> VT  Stable but remains tenuous with end-stage CHF. Coox stable on milrinone, but remains tachycardic with prominent S3. CVP 3-4. Creatinine down very slightly with holding IV lasix.  Continue to hold lasix today. Was only taking torsemide 10 mg M/W/F PTA.   Continue milrinone at 0.125 mcg/kg/min. Plan to trial at home. Will alert [facility] for possible discharge in next few days.   Will have  Medtronic interrogate device to be sure he is in underling sinus.   Will need referral to [facility] as outpatient to consider for second opinion on advanced therapies.   Not candidate for LVAD with marked comorbidities and CKD.   Continue to mobilize with PT/CR.  Length of Stay: 5  Annamaria Helling  07/18/2016, 7:34 AM  Advanced Heart Failure Team Pager [phone] (M-F; 7a - 4p)  Please contact [facility] for night-coverage after hours (4p -7a ) and weekends on [email or web address]  Patient seen and examined with the above-signed Advanced Practice Provider and/or Housestaff. I personally reviewed laboratory data, imaging studies and relevant notes. I independently examined the patient and formulated the important aspects of the plan. I have edited the note to reflect any of my changes or salient points. I have personally discussed the plan with the patient and/or family.  He remains very tenuous despite inotrope support. Renal function slightly better. Continue to hold diuretics.   Still with runs NSVT/VT. Will interrogate ICD.   Will begin  arrangements for home inotropes.   Ambulate today.   Will d/w [facility] for outpatient eval.  Glori Bickers, MD  8:23 AM

## 2016-07-18 NOTE — Progress Notes (Signed)
[facility]  [name] is a new pt for [facility] this hospital admission.   [facility] will provide HHRN and Home Inotrope Team for home Milrinone upon DC.   [facility] will provide in hospital teaching regarding [place] program.    [facility] will follow pt until DC to support transition to home.  If patient discharges after hours, please call [phone].   Larry Sierras 07/18/2016, 2:42 PM

## 2016-07-18 NOTE — Progress Notes (Signed)
  Pt has had 9 episodes of VT with successful ATP from 5/10 -> 07/16/16.        Had several episodes of NSVT/VT 07/17/16 that did NOT require ATP.    Pt is NOT in Afib/ATach.  Intrinsic rate hovering 90-100 bpm.  Optivol had been trending towards fluid but now leveled out and trending towards dry.     Legrand Como [address] [place], PA-C 07/18/2016 9:05 AM

## 2016-07-18 NOTE — Discharge Instructions (Signed)
Heart Failure °Heart failure means your heart has trouble pumping blood. This makes it hard for your body to work well. Heart failure is usually a long-term (chronic) condition. You must take good care of yourself and follow your doctor's treatment plan. °Follow these instructions at home: °· Take your heart medicine as told by your doctor. °? Do not stop taking medicine unless your doctor tells you to. °? Do not skip any dose of medicine. °? Refill your medicines before they run out. °? Take other medicines only as told by your doctor or pharmacist. °· Stay active if told by your doctor. The elderly and people with severe heart failure should talk with a doctor about physical activity. °· Eat heart-healthy foods. Choose foods that are without trans fat and are low in saturated fat, cholesterol, and salt (sodium). This includes fresh or frozen fruits and vegetables, fish, lean meats, fat-free or low-fat dairy foods, whole grains, and high-fiber foods. Lentils and dried peas and beans (legumes) are also good choices. °· Limit salt if told by your doctor. °· Cook in a healthy way. Roast, grill, broil, bake, poach, steam, or stir-fry foods. °· Limit fluids as told by your doctor. °· Weigh yourself every morning. Do this after you pee (urinate) and before you eat breakfast. Write down your weight to give to your doctor. °· Take your blood pressure and write it down if your doctor tells you to. °· Ask your doctor how to check your pulse. Check your pulse as told. °· Lose weight if told by your doctor. °· Stop smoking or chewing tobacco. Do not use gum or patches that help you quit without your doctor's approval. °· Schedule and go to doctor visits as told. °· Nonpregnant women should have no more than 1 drink a day. Men should have no more than 2 drinks a day. Talk to your doctor about drinking alcohol. °· Stop illegal drug use. °· Stay current with shots (immunizations). °· Manage your health conditions as told by your  doctor. °· Learn to manage your stress. °· Rest when you are tired. °· If it is really hot outside: °? Avoid intense activities. °? Use air conditioning or fans, or get in a cooler place. °? Avoid caffeine and alcohol. °? Wear loose-fitting, lightweight, and light-colored clothing. °· If it is really cold outside: °? Avoid intense activities. °? Layer your clothing. °? Wear mittens or gloves, a hat, and a scarf when going outside. °? Avoid alcohol. °· Learn about heart failure and get support as needed. °· Get help to maintain or improve your quality of life and your ability to care for yourself as needed. °Contact a doctor if: °· You gain weight quickly. °· You are more short of breath than usual. °· You cannot do your normal activities. °· You tire easily. °· You cough more than normal, especially with activity. °· You have any or more puffiness (swelling) in areas such as your hands, feet, ankles, or belly (abdomen). °· You cannot sleep because it is hard to breathe. °· You feel like your heart is beating fast (palpitations). °· You get dizzy or light-headed when you stand up. °Get help right away if: °· You have trouble breathing. °· There is a change in mental status, such as becoming less alert or not being able to focus. °· You have chest pain or discomfort. °· You faint. °This information is not intended to replace advice given to you by your health care provider. Make sure you   discuss any questions you have with your health care provider. °Document Released: 12/01/2007 Document Revised: 07/30/2015 Document Reviewed: 04/09/2012 °Elsevier Interactive Patient Education © 2017 [employer] Inc[employer] ° °

## 2016-07-18 NOTE — Progress Notes (Signed)
CM consulted for a benefits check for Ivabridine 2.5 mg BID. CM continuing to follow.

## 2016-07-19 LAB — BASIC METABOLIC PANEL
Anion gap: 8 (ref 5–15)
BUN: 43 mg/dL — AB (ref 6–20)
CHLORIDE: 90 mmol/L — AB (ref 101–111)
CO2: 33 mmol/L — AB (ref 22–32)
Calcium: 9 mg/dL (ref 8.9–10.3)
Creatinine, Ser: 2.71 mg/dL — ABNORMAL HIGH (ref 0.61–1.24)
GFR calc Af Amer: 28 mL/min — ABNORMAL LOW (ref >60)
GFR calc non Af Amer: 24 mL/min — ABNORMAL LOW (ref >60)
GLUCOSE: 123 mg/dL — AB (ref 65–99)
POTASSIUM: 4.3 mmol/L (ref 3.5–5.1)
Sodium: 131 mmol/L — ABNORMAL LOW (ref 135–145)

## 2016-07-19 LAB — COOXEMETRY PANEL
Carboxyhemoglobin: 1.4 % (ref 0.5–1.5)
Methemoglobin: 1 % (ref 0.0–1.5)
O2 Saturation: 64.5 %
Total hemoglobin: 12.3 g/dL (ref 12.0–16.0)

## 2016-07-19 LAB — CBC WITH DIFFERENTIAL/PLATELET
BASOS ABS: 0 10*3/uL (ref 0.0–0.1)
Basophils Relative: 0 %
EOS PCT: 5 %
Eosinophils Absolute: 0.4 10*3/uL (ref 0.0–0.7)
HCT: 37.4 % — ABNORMAL LOW (ref 39.0–52.0)
Hemoglobin: 12.2 g/dL — ABNORMAL LOW (ref 13.0–17.0)
LYMPHS ABS: 1.4 10*3/uL (ref 0.7–4.0)
LYMPHS PCT: 18 %
MCH: 29.3 pg (ref 26.0–34.0)
MCHC: 32.6 g/dL (ref 30.0–36.0)
MCV: 89.9 fL (ref 78.0–100.0)
Monocytes Absolute: 0.9 10*3/uL (ref 0.1–1.0)
Monocytes Relative: 12 %
Neutro Abs: 5.1 10*3/uL (ref 1.7–7.7)
Neutrophils Relative %: 65 %
Platelets: 174 10*3/uL (ref 150–400)
RBC: 4.16 MIL/uL — AB (ref 4.22–5.81)
RDW: 14.1 % (ref 11.5–15.5)
WBC: 7.8 10*3/uL (ref 4.0–10.5)

## 2016-07-19 MED ORDER — IVABRADINE HCL 5 MG PO TABS
5.0000 mg | ORAL_TABLET | Freq: Two times a day (BID) | ORAL | Status: DC
  Filled 2016-07-19: qty 1

## 2016-07-19 MED ORDER — TORSEMIDE 20 MG PO TABS: ORAL_TABLET | ORAL | 6 refills | Status: DC

## 2016-07-19 MED ORDER — MILRINONE LACTATE IN DEXTROSE 20-5 MG/100ML-% IV SOLN: 0.1250 ug/kg/min | INTRAVENOUS | 0 refills | Status: AC

## 2016-07-19 MED ORDER — IVABRADINE HCL 5 MG PO TABS: 5.0000 mg | ORAL_TABLET | Freq: Two times a day (BID) | ORAL | 3 refills | Status: DC

## 2016-07-19 MED ORDER — IVABRADINE HCL 5 MG PO TABS
2.5000 mg | ORAL_TABLET | Freq: Once | ORAL | Status: AC
  Administered 2016-07-19: 2.5 mg via ORAL
  Filled 2016-07-19: qty 1

## 2016-07-19 NOTE — Care Management Note (Signed)
Case Management Note  Patient Details  Name: NOBORU BIDINGER MRN: [id number] Date of Birth: [birth year]  Subjective/Objective:                    Action/Plan: CM met with the patient and provided him a list of [place] agencies for his home Milrinone. Pt selected [facility]. CM notified Pam with [facility].  Pt states he lives with his wife and handicapped son. Pt states his wife works at [employer] in nutrition and he will be responsible for the IV Milrinone when she is working. CM following.   Expected Discharge Date:                  Expected Discharge Plan:  [place]  In-House Referral:     Discharge planning Services  CM Consult  Post Acute Care Choice:    Choice offered to:     DME Arranged:    DME Agency:     HH Arranged:    [place] Agency:     Status of Service:  In process, will continue to follow  If discussed at Long Length of Stay Meetings, dates discussed:    Additional Comments:  Pollie Friar, RN 07/19/2016, 7:43 AM

## 2016-07-19 NOTE — Care Management Note (Addendum)
Case Management Note Previous Note by Bronson Curb  Patient Details  Name: [name] MRN: [id number] Date of Birth: 1960-12-20  Subjective/Objective:                    Action/Plan: CM met with the patient and provided him a list of [place] agencies for his home Milrinone. Pt selected [place]. CM notified Pam with [facility].  Pt states he lives with his wife and handicapped son. Pt states his wife works at [employer] in nutrition and he will be responsible for the IV Milrinone when she is working. CM following.   Expected Discharge Date:  07/19/16               Expected Discharge Plan:  [place]  In-House Referral:     Discharge planning Services  CM Consult  Post Acute Care Choice:    Choice offered to:     DME Arranged:    DME Agency:     HH Arranged:    [place] Agency:     Status of Service:  In process, will continue to follow  If discussed at Long Length of Stay Meetings, dates discussed:    Additional Comments: 07/19/2016  CM provided copay card and samples provided by HF team.  Pt states he can afford the $20 copay.  Benefit check yielded '5mg'$  for $50 without prior approval and lisinopril $7 as back up.  CM spoke directly with NP - lisnoprill is not acceptable and therefore the HF team will arrange for pt to get samples of drug as pt states the $50 will cause hardship. CM provided pt copay card - HF provided samples  Pt to discharge home today with wife.  Pt states he knows that he is supposed to weigh himself daily - also that [facility] has arranged to bring him the HF scale for daily weights, pt stated "I know I am supposed to be on a low salt diet".  Agency made aware of discharge today -  Pt states he was active with [facility] for PT/OT prior to this admit.  Pt has walker and wheelchair in the home and confirmed he was independent prior to this admit.  Pt denied barriers to returning home.  CM still awaiting results of benefits check Maryclare Labrador, RN 07/19/2016, 8:44 AM

## 2016-07-19 NOTE — Evaluation (Signed)
Physical Therapy Evaluation Patient Details Name: [name] MRN: [id number] DOB: [birth year] Today's Date: 07/19/2016   History of Present Illness  56 y.o. male admitted to [facility] on 07/13/16 for cardiogenic shock due to acute on chronic systolic CHF (s/p cardiac cath on day of admission), acute on chronic kidney injury, paralyzed R hemidiaphram, and recent admission for septic shock due to CAP 06/2016.  Pt with other significant PMHx of v-tach s/p ICD, paraganglioma, HTN, gout, CAD, CKD III, bladder tumor s/p resection, and VATS.  Clinical Impression  Pt is mobilizing at a supervision level with IV pole for stability while walking.  I advised pt that he use his quad cane given he reports he is starting to have a gout flare in his knee to help him until he gets more stable and less painful.  He was active with HHPT/OT PTA and would benefit from these resuming.   PT to follow acutely for deficits listed below.       Follow Up Recommendations Home health PT;Supervision for mobility/OOB;Other (comment) (Pt was active with HHPT and HHOT PTA)    Equipment Recommendations  None recommended by PT    Recommendations for Other Services OT consult     Precautions / Restrictions Precautions Precautions: Fall Precaution Comments: mildly unsteady on his feet.  R knee painful per pt due to gout flare  Restrictions Weight Bearing Restrictions: No      Mobility  Bed Mobility               General bed mobility comments: Pt was OOB in the recliner chair.   Transfers Overall transfer level: Needs assistance Equipment used: None   Sit to Stand: Supervision         General transfer comment: supervision for safety, pt able to stand and sit using recliner armrests for leverag and help with transitions, once stanidng, stabilized on IV pole.   Ambulation/Gait Ambulation/Gait assistance: Supervision Ambulation Distance (Feet): 120 Feet Assistive device:  (IV pole) Gait Pattern/deviations:  Step-through pattern;Staggering left;Staggering right Gait velocity: decreased Gait velocity interpretation: Below normal speed for age/gender General Gait Details: antalgic gait pattern due to R knee pain/gout, mildly staggering pattern, but did not require assist to recover.  Pt asked about using his quad cane at home, I agreed since he did not look completely steady on his feet the quad cane for a short time would be adviseable.  HHPT to help him wean from quad cane.           Balance Overall balance assessment: Needs assistance Sitting-balance support: Feet supported;No upper extremity supported Sitting balance-Leahy Scale: Good     Standing balance support: Single extremity supported Standing balance-Leahy Scale: Fair                               Pertinent Vitals/Pain Pain Assessment: Faces Faces Pain Scale: Hurts little more Pain Location: right knee Pain Descriptors / Indicators: Guarding Pain Intervention(s): Limited activity within patient's tolerance;Monitored during session;Repositioned    Home Living Family/patient expects to be discharged to:: Private residence Living Arrangements: Spouse/significant other;Children Available Help at Discharge: Family;Available 24 hours/day (son is home from college as well as disabled son, wife works) Type of Home: House Home Access: [place]: [place]: [place];Shower seat - built in;Crutches;Wheelchair - manual      Prior Function Level of Independence: Independent  Comments: at times uses his cane when his gout flares up     Hand Dominance   Dominant Hand: Right    Extremity/Trunk Assessment   Upper Extremity Assessment Upper Extremity Assessment: Generalized weakness    Lower Extremity Assessment Lower Extremity Assessment: Generalized weakness (pt at least 3/5 per gross functional assessment, pain R knee)    Cervical / Trunk  Assessment Cervical / Trunk Assessment: Normal  Communication   Communication: No difficulties  Cognition Arousal/Alertness: Awake/alert Behavior During Therapy: WFL for tasks assessed/performed Overall Cognitive Status: Within Functional Limits for tasks assessed                                        General Comments General comments (skin integrity, edema, etc.): VSS during gait on RA, no DOE, however, coughing spell after he sat back down.  He attributed that to allergies.         Assessment/Plan    PT Assessment Patient needs continued PT services  PT Problem List Decreased strength;Decreased activity tolerance;Decreased balance;Decreased mobility;Decreased knowledge of use of DME;Cardiopulmonary status limiting activity;Pain       PT Treatment Interventions DME instruction;Gait training;Stair training;Functional mobility training;Therapeutic activities;Therapeutic exercise;Balance training;Patient/family education    PT Goals (Current goals can be found in the Care Plan section)  Acute Rehab PT Goals Patient Stated Goal: to go home today PT Goal Formulation: With patient Time For Goal Achievement: 08/02/16 Potential to Achieve Goals: Good    Frequency Min 3X/week           AM-PAC PT "6 Clicks" Daily Activity  Outcome Measure Difficulty turning over in bed (including adjusting bedclothes, sheets and blankets)?: A Little Difficulty moving from lying on back to sitting on the side of the bed? : A Little Difficulty sitting down on and standing up from a chair with arms (e.g., wheelchair, bedside commode, etc,.)?: Total Help needed moving to and from a bed to chair (including a wheelchair)?: None Help needed walking in hospital room?: None Help needed climbing 3-5 steps with a railing? : A Little 6 Click Score: 18    End of Session   Activity Tolerance: Patient limited by fatigue;Patient limited by pain Patient left: in chair;with call bell/phone  within reach Nurse Communication: Mobility status PT Visit Diagnosis: Unsteadiness on feet (R26.81);Muscle weakness (generalized) (M62.81);Difficulty in walking, not elsewhere classified (R26.2)    Time: 1962-2297 PT Time Calculation (min) (ACUTE ONLY): 37 min   Charges:         Wells Guiles B. Afiya Ferrebee, PT, DPT [phone]   PT Evaluation $PT Eval Moderate Complexity: 1 Procedure PT Treatments $Gait Training: 8-22 mins   07/19/2016, 12:22 PM

## 2016-07-19 NOTE — Progress Notes (Signed)
Advanced Heart Failure Rounding Note   Primary Cardiologist: Dr. Haroldine Laws   Subjective:    Admitted from cath lab 07/13/16 with cardiogenic shock.  Required high dose IV lasix and milrinone up to 0.375 mcg/kg/min  Had multiple episodes of VT on 5/9 so milrinone cut back to 0.125.   Coox 64.5% on milrinone 0.125 mcg/kg/min.  CVP 4-5. Lasix on hold. Creatinine slowly trending down.   Feeling better. Hasn't been up much.  Wants to go home. Denies SOB at rest or around room.  Sits up in chair to sleep with diaphragm.  By ICD interrogation had 9 episodes of VT with successful ATP from 5/10 -> 07/16/16. No Afib.  Objective:   Weight Range: 213 lb 14.4 oz (97 kg) Body mass index is 30.69 kg/m.   Vital Signs:   Temp:  [98 F (36.7 C)-98.7 F (37.1 C)] 98.6 F (37 C) (05/15 0400) Pulse Rate:  [97-114] 97 (05/15 0400) Resp:  [16-33] 21 (05/15 0400) BP: (90-122)/(67-88) 106/71 (05/15 0400) SpO2:  [86 %-100 %] 94 % (05/15 0400) Weight:  [213 lb 14.4 oz (97 kg)] 213 lb 14.4 oz (97 kg) (05/15 0500) Last BM Date: 07/18/16  Weight change: Filed Weights   07/17/16 0500 07/18/16 0303 07/19/16 0500  Weight: 210 lb (95.3 kg) 211 lb (95.7 kg) 213 lb 14.4 oz (97 kg)    Intake/Output:   Intake/Output Summary (Last 24 hours) at 07/19/16 0739 Last data filed at 07/19/16 0441  Gross per 24 hour  Intake           412.18 ml  Output              800 ml  Net          -387.82 ml     Physical Exam: General: Fatigued appearing. NAD.  HEENT: Normal Neck: supple. JVD flat Carotids 2+ bilat; no bruits. No thyromegaly or nodule noted. Cor: PMI nondisplaced. Regular, slightly tachy. +S3 Lungs: Markedly decreased R base. Otherwise clear.  Abdomen: soft, non-tender, distended, no HSM. No bruits or masses. +BS  Extremities: no cyanosis, clubbing, rash, R and LLE no edema.  Neuro: alert & orientedx3, cranial nerves grossly intact. moves all 4 extremities w/o difficulty. Affect pleasant    Telemetry: Personally reviewed, NSR-ST 90-100, PVCs   Labs: CBC  Recent Labs  07/18/16 0625 07/19/16 0515  WBC 7.7 7.8  NEUTROABS 4.9 5.1  HGB 12.9* 12.2*  HCT 39.0 37.4*  MCV 89.9 89.9  PLT 167 627   Basic Metabolic Panel  Recent Labs  07/18/16 0625 07/19/16 0515  NA 132* 131*  K 4.6 4.3  CL 88* 90*  CO2 35* 33*  GLUCOSE 106* 123*  BUN 43* 43*  CREATININE 2.82* 2.71*  CALCIUM 9.2 9.0  MG 2.0  --    Liver Function Tests No results for input(s): AST, ALT, ALKPHOS, BILITOT, PROT, ALBUMIN in the last 72 hours. No results for input(s): LIPASE, AMYLASE in the last 72 hours. Cardiac Enzymes No results for input(s): CKTOTAL, CKMB, CKMBINDEX, TROPONINI in the last 72 hours.  BNP: BNP (last 3 results)  Recent Labs  06/06/16 1645 06/06/16 1925 07/07/16 0929  BNP 796.0* 553.0* 932.0*    ProBNP (last 3 results) No results for input(s): PROBNP in the last 8760 hours.   D-Dimer No results for input(s): DDIMER in the last 72 hours. Hemoglobin A1C No results for input(s): HGBA1C in the last 72 hours. Fasting Lipid Panel No results for input(s): CHOL, HDL, LDLCALC, TRIG, CHOLHDL,  LDLDIRECT in the last 72 hours. Thyroid Function Tests No results for input(s): TSH, T4TOTAL, T3FREE, THYROIDAB in the last 72 hours.  Invalid input(s): FREET3  Other results:     Imaging/Studies:  No results found.    Medications:     Scheduled Medications: . atorvastatin  40 mg Oral QHS  . enoxaparin (LOVENOX) injection  30 mg Subcutaneous Q24H  . febuxostat  40 mg Oral Daily  . ivabradine  2.5 mg Oral BID WC  . loratadine  10 mg Oral Daily  . magnesium oxide  400 mg Oral BID  . pantoprazole  40 mg Oral Daily  . sodium chloride flush  10-40 mL Intracatheter Q12H  . tobramycin-dexamethasone  1 drop Right Eye BID    Infusions: . sodium chloride 250 mL (07/18/16 1400)  . sodium chloride Stopped (07/14/16 0800)  . milrinone 0.125 mcg/kg/min (07/19/16 0200)     PRN Medications: sodium chloride, acetaminophen, ALPRAZolam, fluticasone furoate-vilanterol, HYDROcodone-acetaminophen, ondansetron (ZOFRAN) IV, sodium chloride flush, sodium chloride flush   Assessment/Plan   1. Acute on chronic systolic CHF with cardiogenic shock - Stable but tenuous with his end-stage CHF. Remains somewhat tachy with prominent S3. - NYHA III. Volume status improved from admit. CVP 4-5 this am.  Hold torsemide for today, possibly resume tomorrow, especially if goes home.  Was only taking torsemide 10 mg M/W/F PTA.  - Continue milrinone 01.25 mcg/kg/min. Coox stable.  Will plan for home milrinone. Likely home in next 24-48 hours.  -Tolerated addition of corlanor 2.5 mg BID. - Not on ACE/ARB with AKI and hypotension - Not on spiro with AKI/CKD - Not on digoxin with AKI/CKD - Not on BB with low output 2. AKI on CKD III - Trending down slowly with holding lasix.  3. Paralyzed right hemidiaphragm - Contributes to his dyspnea. 4. Hx of paraganglioma resection - with sequelae of #3 5. Recent septic shock due to CAP admission 06/2016 - Will need to continue PT on d/c with deconditioning. 6. OSA on BiPAP - Setting adjusted by RT last night. No change.  7. NSVT -> VT - Has had multiple episodes of VT with ATP since admission. Has been quiescent since 5/12 per ICD interrogation. - Follow electrolytes.  Not candidate for LVAD with marked comorbidities and CKD. Will need referral to [facility] as outpatient for second opinion on advanced therapies.  Needs to mobilize.  Will need to continue PT at home. Will discuss dispo with MD. Possible home next 24-48 hours.  Suspect can resume po diuretics at low dose tomorrow, especially if goes home.   Length of Stay: 5 3rd Dr.  Annamaria Helling  07/19/2016, 7:39 AM  Advanced Heart Failure Team Pager [phone] (M-F; 7a - 4p)  Please contact [facility] for night-coverage after hours (4p -7a ) and weekends on  [email or web address]  Patient seen and examined with the above-signed Advanced Practice Provider and/or Housestaff. I personally reviewed laboratory data, imaging studies and relevant notes. I independently examined the patient and formulated the important aspects of the plan. I have edited the note to reflect any of my changes or salient points. I have personally discussed the plan with the patient and/or family.  He is stable. Volumes status looks good. Co-ox stable on milrinone 0.125. Will d/c home today with close f/u in the HF Clinic. Will also refer to [facility] for second opinion on his candidacy foradvanced therapies.   Glori Bickers, MD  9:59 PM

## 2016-07-19 NOTE — Progress Notes (Signed)
Pt complaining about not getting enough air, RT increased minimum pressure to 10 CMH20.  Pt tolerating well at this time.

## 2016-07-19 NOTE — Progress Notes (Signed)
[facility]  New pt for [facility] this admission.  [facility] will provide [facility] and [facility] services for home Milrinone, telehealth monitoring and heart failure management.  [facility] Coordinator will connect pt to Milrinone infusion prior to DC home today.  If patient discharges after hours, please call [phone].   Larry Sierras 07/19/2016, 8:42 AM

## 2016-07-20 ENCOUNTER — Other Ambulatory Visit (HOSPITAL_COMMUNITY): Payer: Self-pay | Admitting: *Deleted

## 2016-07-21 ENCOUNTER — Inpatient Hospital Stay (HOSPITAL_COMMUNITY): Admission: RE | Admit: 2016-07-21 | Payer: 59 | Source: Ambulatory Visit

## 2016-07-26 ENCOUNTER — Ambulatory Visit (HOSPITAL_COMMUNITY)
Admit: 2016-07-26 | Discharge: 2016-07-26 | Disposition: A | Discharge status: Home / Self Care | Payer: 59 | Attending: Internal Medicine | Admitting: Internal Medicine

## 2016-07-26 ENCOUNTER — Encounter (HOSPITAL_COMMUNITY): Payer: Self-pay | Admitting: Internal Medicine

## 2016-07-26 ENCOUNTER — Ambulatory Visit (INDEPENDENT_AMBULATORY_CARE_PROVIDER_SITE_OTHER): Payer: Self-pay

## 2016-07-26 VITALS — BP 124/88 | HR 90 | Wt 219.5 lb

## 2016-07-26 DIAGNOSIS — N184 Chronic kidney disease, stage 4 (severe): Secondary | ICD-10-CM | POA: Diagnosis not present

## 2016-07-26 DIAGNOSIS — Z95810 Presence of automatic (implantable) cardiac defibrillator: Secondary | ICD-10-CM | POA: Diagnosis not present

## 2016-07-26 DIAGNOSIS — I2510 Atherosclerotic heart disease of native coronary artery without angina pectoris: Secondary | ICD-10-CM | POA: Insufficient documentation

## 2016-07-26 DIAGNOSIS — G4733 Obstructive sleep apnea (adult) (pediatric): Secondary | ICD-10-CM | POA: Diagnosis not present

## 2016-07-26 DIAGNOSIS — Z9889 Other specified postprocedural states: Secondary | ICD-10-CM | POA: Insufficient documentation

## 2016-07-26 DIAGNOSIS — I5022 Chronic systolic (congestive) heart failure: Secondary | ICD-10-CM | POA: Diagnosis present

## 2016-07-26 DIAGNOSIS — I5042 Chronic combined systolic (congestive) and diastolic (congestive) heart failure: Secondary | ICD-10-CM

## 2016-07-26 DIAGNOSIS — E875 Hyperkalemia: Secondary | ICD-10-CM | POA: Diagnosis not present

## 2016-07-26 DIAGNOSIS — C679 Malignant neoplasm of bladder, unspecified: Secondary | ICD-10-CM | POA: Insufficient documentation

## 2016-07-26 DIAGNOSIS — I130 Hypertensive heart and chronic kidney disease with heart failure and stage 1 through stage 4 chronic kidney disease, or unspecified chronic kidney disease: Secondary | ICD-10-CM | POA: Insufficient documentation

## 2016-07-26 DIAGNOSIS — I429 Cardiomyopathy, unspecified: Secondary | ICD-10-CM | POA: Diagnosis not present

## 2016-07-26 DIAGNOSIS — M1990 Unspecified osteoarthritis, unspecified site: Secondary | ICD-10-CM | POA: Diagnosis not present

## 2016-07-26 DIAGNOSIS — M109 Gout, unspecified: Secondary | ICD-10-CM | POA: Insufficient documentation

## 2016-07-26 DIAGNOSIS — E669 Obesity, unspecified: Secondary | ICD-10-CM | POA: Insufficient documentation

## 2016-07-26 DIAGNOSIS — N179 Acute kidney failure, unspecified: Secondary | ICD-10-CM | POA: Insufficient documentation

## 2016-07-26 DIAGNOSIS — E039 Hypothyroidism, unspecified: Secondary | ICD-10-CM | POA: Diagnosis not present

## 2016-07-26 DIAGNOSIS — Z992 Dependence on renal dialysis: Secondary | ICD-10-CM | POA: Diagnosis not present

## 2016-07-26 LAB — BASIC METABOLIC PANEL
Anion gap: 10 (ref 5–15)
BUN: 43 mg/dL — ABNORMAL HIGH (ref 6–20)
CO2: 27 mmol/L (ref 22–32)
Calcium: 9.6 mg/dL (ref 8.9–10.3)
Chloride: 100 mmol/L — ABNORMAL LOW (ref 101–111)
Creatinine, Ser: 2.7 mg/dL — ABNORMAL HIGH (ref 0.61–1.24)
GFR calc Af Amer: 28 mL/min — ABNORMAL LOW (ref >60)
GFR calc non Af Amer: 25 mL/min — ABNORMAL LOW (ref >60)
GLUCOSE: 135 mg/dL — AB (ref 65–99)
POTASSIUM: 4.3 mmol/L (ref 3.5–5.1)
Sodium: 137 mmol/L (ref 135–145)

## 2016-07-26 MED ORDER — TORSEMIDE 20 MG PO TABS: ORAL_TABLET | ORAL | 6 refills | Status: AC

## 2016-07-26 NOTE — Progress Notes (Signed)
EPIC Encounter for ICM Monitoring  Patient Name: [name] is a 56 y.o. male Date: 07/26/2016 Primary Care Physican: Hulan Fess, MD Primary Cardiologist:Bensimhon Electrophysiologist: Faustino Congress Weight:unknown Bi-V Pacing: 92%  Clinical Status (18-Jul-2016 to 26-Jul-2016) Treated VT/VF 0 episodes  AT/AF 0 episodes  Time in AT/AF <0.1 hr/day (<0.1%)  Observations (2) (18-Jul-2016 to 26-Jul-2016)  Night heart rate over 85 bpm for 7 days.  Patient Activity less than 1 hr/day for 1 weeks.       No call to patient since he is being seen at HF clinic today.   Patient readmitted to hospital 5/9 to 5/14 and discharged with milrinone at home.    Thoracic impedance close to baseline but has been abnormal suggesting fluid accumulation.  Impedance consistent with hospitalization and retaining fluid and being diuresed.  Prescribed dosage: Torsemide 20 mg 2 tabs Every Mon, Wed and Friday, take extra pill on off days for weight gain or swelling.  Labs: 07/07/2016 Creatinine 2.26, BUN 34, Potassium 4.7, Sodium 133, EGFR 30-35 07/01/2016 Creatinine 2.73, BUN 38, Potassium 5.3, Sodium 132, EGFR 24-28  06/30/2016 Creatinine 2.63, BUN 39, Potassium 5.1, Sodium 134, EGFR 25-29  06/29/2016 Creatinine 2.64, BUN 38, Potassium 5.1, Sodium 134, EGFR 25-29   Recommendations:  None, will be given at HF clinic appt today if any recommendations.   Follow-up plan: ICM clinic phone appointment on 08/04/2016.  Patient being seen at HF clinic today.  Copy of ICM check sent to device physician.   3 month ICM trend: 07/26/2016   1 Year ICM trend:      Rosalene Billings, RN 07/26/2016 1:49 PM

## 2016-07-26 NOTE — Progress Notes (Signed)
Patient ID: [name], male   DOB: 11/27/1960, 56 y.o.   MRN: [id number]  PCP: Dr Rex Kras  Nephrologist: Dr Marval Regal  Cardiologist: Dr Caryl Comes Urologist: Dr Karsten Ro  HPI: [name] is a 56 y.o. 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   He is intolerant to numerous HF meds including spiro (severe hyperkalemia - requiring HD), hydralazine (fuzzy-headed) and imdur (severe HAs). S/P 12/17/12 bladder cancer removal.   11/29/11 ECHO EF 15% Diffuse hypokinesis. Features are consistent with a pseudonormal left ventricular filling pattern, with concomitant abnormal relaxation and increased filling pressure (grade 2 diastolic dysfunction).   Admitted to [facility] 01/26/2012 after his ICD fired 5 times Potassium >7.5 Required urgent dialysis. Discharged form [facility] 01/29/12. Discharge weight 250 pounds.   CPX 07/12/13 FVC 1.55 (37%)  FEV1 1.19 (36%)  FEV1/FVC 77%  MVV 53 (35%) Exercise Time: 3:30 Speed (mph): 1.5 Grade (%): 0  RPE: 15 Resting HR: 109 Peak HR: 141 (84% age predicted max HR) BP rest: 130/90 BP peak: 142/96 Peak VO2: 9.0 (34.7% predicted peak VO2)  VE/VCO2 slope: 32.0 OUES: 1.90 Peak RER: 0.90 Ventilatory Threshold: Could not be detected VE/MVV: 58.5% PETCO2 at peak: 33 O2pulse: 8 (44% predicted O2pulse)   He is S/P 02/13/2014 RVATS and R thoracotomy  With resection of paraganglioma. Required milrinone support post-operatively.   Admitted 06/06/2016 with acute resp failure requiring intubation 2/2 to volume overload and septic shock. BCx 1/2 + for strep viridans. No endocarditis or vegetation noted on TEE 06/15/16. Extubated 06/18/16. 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 Diuresed from 245 to 224 on discharge.  Spent nearly a week in CIR prior to d/c. Discharge weight was 224 lbs.   ECHO 06/09/2016 EF 20%, severe LV dilation, Mild AI, Mod MR, Mod LAE.   Readmitted last week with 5/9-15/2018 with low output HF. Started on milrinone. Discharge weight 218->213. Here for post-hospital f/u Still feels weak. Hard time with ADLs. Working with HHPT. Had gout for a few days but better now after seeing Dr. Amil Amen. SOB with minimal activity. Taking torsemide 20mg  M/W/F and prn. Weight up 3 pounds.+ orthopnea.  No problems with PICC.    RHC 07/13/2016 Hemodynamics (mmHg) RA mean 19 RV 85/18 PA 82/44 PCWP 36 AO 97% Cardiac Output (Fick) 2.79 Cardiac Index (Fick) 1.29   Optivol: Fluid index below threshold but trending up. No AF/VT.  Activity level about 0 Personally reviewed   PFTs 05/2016 FVC 1.22 (32%) FEV1 0.95 (32%) TLC 3.58 (53%) DLCO 11.8 (42%)  Labs  12/12/12 dig level 0.9 12/18/12 Magnesium 1.1 K 4.0 Creatinine 1.5 01/02/13: Uric acid 12.9, K+ 3.9, Cr 1.37, AST 21, ALT 19, Mag 1.3 01/29/13 K 4.1 Creatinine 1.5 Magnesium 1.5 03/21/13 Magnesium 1.7 TSH 1.11--> synthorid increased to 100 mcg.  7/15: K 4, creatinine 1.35 8/15: digoxin 0.5 12/2013: K+ 4.4, creatinine 1.39 7/16 K 4.9, creatinine 1.58  Review of systems complete and found to be negative unless listed in HPI.    Past Medical History:  Diagnosis Date  . Arthritis    GOUT  . Automatic implantable cardioverter-defibrillator in situ   . Biventricular ICD (implantable cardiac defibrillator) Medtronic ]    DOI 2008/ upgrade 2010/ Gen Change 2014  . Bladder tumor    PT HOSP AT [facility] 4/24 TO 4/26  2014 WITH UTI--AND FOUND TO HAVE BLADDER TUMOR-  . Cardiomyopathy    Idiopathic dilated;   . CHF (congestive heart failure) ([place])   . CKD (chronic kidney disease)    stage III baseline Crt 1.8-2.0  . Coronary artery disease   . Gout 08/03/12   PT C/O OF RIGHT KNEE PAIN AND SWELLING -STATES GOUT FLARE UP - ONGOING SINCE APRIL - BUT  SWELLING W/IN LAST WEEK  . Hilar mass    Noted CT 2010  . HTN (hypertension)    Dr. Caryl Comes cardiologist  . Hypothyroidism   . Paraganglioma ([place])    //neuroendocrine tumor of the right chest per notes 02/10/2014  . Sleep apnea    CPAP  . Systolic CHF ([place])    EF 74%  . Ventricular tachycardia [facility])    s/p ICD    Current Outpatient Prescriptions  Medication Sig Dispense Refill  . ALPRAZolam (XANAX) 0.25 MG tablet Take 1-2 tablets (0.25-0.5 mg total) by mouth 3 (three) times daily as needed for anxiety. 20 tablet 0  . atorvastatin (LIPITOR) 40 MG tablet Take 1 tablet (40 mg total) by mouth at bedtime. 30 tablet 2  . BREO ELLIPTA 100-25 MCG/INH AEPB Inhale 1 puff into the lungs daily as needed (for shortness of breath).   11  . colchicine 0.6 MG tablet Take 1 tablet (0.6 mg total) by mouth daily. 30 tablet 0  . febuxostat (ULORIC) 40 MG tablet Take 40 mg by mouth daily.    Marland Kitchen HYDROcodone-acetaminophen (NORCO/VICODIN) 5-325 MG tablet Take 0.5-1 tablets by mouth every 6 (six) hours as needed for moderate pain. 20 tablet 0  . ivabradine (CORLANOR) 5 MG TABS tablet Take 2.5 mg by mouth 2 (two) times daily with a meal.    . loratadine (CLARITIN) 10 MG tablet Take 1 tablet (10 mg total) by mouth daily. 30 tablet 0  . Magnesium 400 MG TABS Take 400 mg by mouth 2 (two) times daily. 180 tablet 2  . milrinone (PRIMACOR) 20 MG/100 ML SOLN infusion Inject 12.3625 mcg/min into the vein continuous. 100 mL 0  . MITIGARE 0.6 MG CAPS Take 0.6 mg by mouth daily.    Marland Kitchen omeprazole (PRILOSEC) 40 MG capsule Take 40 mg by mouth daily as needed (acid reflux).     Marland Kitchen tobramycin-dexamethasone (TOBRADEX) ophthalmic solution Place 1 drop into the right eye 2 (two) times daily.    Marland Kitchen torsemide (DEMADEX) 20 MG tablet One tablet (20 mg) on Monday Wednesdays and Fridays. May take extra pill on off days for weight gain or swelling. 20 tablet 6   No current facility-administered medications for this encounter.       Vitals:   07/26/16 1016  BP: 124/88  Pulse: 90  SpO2: 96%  Weight: 219 lb 8 oz (99.6 kg)   Wt Readings from Last 3 Encounters:  07/26/16 219 lb 8 oz (99.6 kg)  07/19/16 213 lb 14.4 oz (97 kg)  07/07/16 225 lb (102.1 kg)    PHYSICAL EXAM: General: Fatigued appearing.  HEENT: Normal except for R exopthalmus.  Neck: Supple. JVD 7-8 cm. 2+ bilat; no bruits. No thyromegaly or nodule noted. R neck scar Cor: PMI nondisplaced. RRR, No M/G/R noted + 3. Lungs: Diminished basilar sounds. R base nearly absent  Abdomen: soft, non-tender, mildly distended, no HSM. No bruits or masses. +BS  Extremities: no cyanosis, clubbing, rash, 2+ ankle edema bilaterally.  Neuro: Alert & oriented x 3. Cranial nerves grossly intact. Moves all 4 extremities w/o difficulty. Affect pleasant  ASSESSMENT & PLAN:  1. Chronic sysotolic CHF.  - Echo 07/06/7614 with EF 20%. 15% by TEE 06/15/16.  - Remains NYHA IIIB despite recent initiation of milrinone  - Volume status mildly elevated.  - Will increase torsemide to 40mg  M/W/F with extra as needed. - No bblocker due to low output. No ACE/ARB/ARNI with CKD - Unable to tolerate hydralazine due to low BP. Failed nitrates with sever HAs  - Long talk with him and his son about limited options given previous sternotomy, RV failure, paralyzed R hemidiaphragm and advanced CKD - I also discussed with Dr. Mosetta Pigeon at [facility] who will see him to assess candidact for heart/kidney transplant    2. AKI on CKD III-IV -Creatinine stable at 2.7 today 3. Paralzyed right hemidiaphragm.  - Stable. - Too ill to consider diaphragm plication.  4. History of paraganglioma resection - Stable.  5. OSA on CPAP  Total time spent 45 minutes. Over half that time spent discussing above.    Glori Bickers, MD  07/26/2016

## 2016-07-26 NOTE — Patient Instructions (Signed)
Increase Torsemide to to 40 mg (2 tabs) every Mon, Wed and Fri.  Can take extra on off days as needed for weight gain and swelling  Lab today  Your physician recommends that you schedule a follow-up appointment in: 2-3 weeks

## 2016-08-04 ENCOUNTER — Encounter (HOSPITAL_COMMUNITY): Payer: Self-pay | Admitting: *Deleted

## 2016-08-04 ENCOUNTER — Ambulatory Visit (INDEPENDENT_AMBULATORY_CARE_PROVIDER_SITE_OTHER): Payer: 59 | Admitting: *Deleted

## 2016-08-04 DIAGNOSIS — I428 Other cardiomyopathies: Secondary | ICD-10-CM

## 2016-08-04 DIAGNOSIS — I5042 Chronic combined systolic (congestive) and diastolic (congestive) heart failure: Secondary | ICD-10-CM | POA: Diagnosis not present

## 2016-08-04 DIAGNOSIS — Z95810 Presence of automatic (implantable) cardiac defibrillator: Secondary | ICD-10-CM

## 2016-08-04 NOTE — Progress Notes (Signed)
Patient dropped off FMLA paperwork for family member on Jul 29, 2016.  Paperwork completed today and left at front desk for patient to pick up tomorrow.  Patient is aware.  Copy of paperwork will be scanned to patient's electronic medical record.

## 2016-08-04 NOTE — Progress Notes (Signed)
Patient dropped off disability forms [employer] on Jul 21, 2016.  Forms completed and signed today and left at front desk for patient to pick up.  Patient is aware and will pick up forms tomorrow.    Copy of original form will be scanned to patient's electronic medical record.

## 2016-08-04 NOTE — Progress Notes (Signed)
EPIC Encounter for ICM Monitoring  Patient Name: [name] is a 56 y.o. male Date: 08/04/2016 Primary Care Physican: Hulan Fess, MD Primary Cardiologist:Bensimhon Electrophysiologist: Caryl Comes Dry Weight:210 lbs Bi-V Pacing: 92.5%      Heart Failure questions reviewed, pt asymptomatic   Thoracic impedance close baseline.  Patient took extra Furosemide on 07/31/2016 due to foot swelling which has resolved  Prescribed dosage: Torsemide 20 mg 2 tabs Every Mon, Wed and Friday, take extra pill on off days for weight gain or swelling.  Labs: 07/07/2016 Creatinine 2.26, BUN 34, Potassium 4.7, Sodium 133, EGFR 30-35 07/01/2016 Creatinine 2.73, BUN 38, Potassium 5.3, Sodium 132, EGFR 24-28  06/30/2016 Creatinine 2.63, BUN 39, Potassium 5.1, Sodium 134, EGFR 25-29  06/29/2016 Creatinine 2.64, BUN 38, Potassium 5.1, Sodium 134, EGFR 25-29   Recommendations: No changes. Discussed to limit salt intake to 2000 mg/day and fluid intake to < 2 liters/day.  Encouraged to call for fluid symptoms or use local ER for any urgent symptoms.  Follow-up plan: ICM clinic phone appointment on 09/06/2016.  Office appointment scheduled on 08/16/2016 with HF clinic and 09/13/2016 with Dr Haroldine Laws.  Copy of ICM check sent to primary cardiologist and device physician.   3 month ICM trend: 08/04/2016   1 Year ICM trend:      Rosalene Billings, RN 08/04/2016 3:23 PM

## 2016-08-05 ENCOUNTER — Other Ambulatory Visit (HOSPITAL_COMMUNITY): Payer: Self-pay | Admitting: Internal Medicine

## 2016-08-05 ENCOUNTER — Other Ambulatory Visit: Payer: Self-pay | Admitting: Internal Medicine

## 2016-08-05 NOTE — Progress Notes (Signed)
Remote ICD transmission.   

## 2016-08-10 ENCOUNTER — Other Ambulatory Visit: Payer: Self-pay | Admitting: Internal Medicine

## 2016-08-10 LAB — CUP PACEART REMOTE DEVICE CHECK
Brady Statistic AP VS Percent: 0 %
Brady Statistic AS VP Percent: 94.99 %
Brady Statistic AS VS Percent: 5 %
Brady Statistic RA Percent Paced: 0.01 %
Brady Statistic RV Percent Paced: 92.54 %
HighPow Impedance: 44 Ohm
HighPow Impedance: 52 Ohm
Implantable Lead Implant Date: 20080829
Implantable Lead Implant Date: 20100205
Implantable Lead Location: 753859
Implantable Lead Model: 4194
Implantable Lead Model: 5076
Lead Channel Impedance Value: 247 Ohm
Lead Channel Impedance Value: 456 Ohm
Lead Channel Impedance Value: 456 Ohm
Lead Channel Pacing Threshold Amplitude: 0.875 V
Lead Channel Pacing Threshold Pulse Width: 0.4 ms
Lead Channel Sensing Intrinsic Amplitude: 14.125 mV
Lead Channel Sensing Intrinsic Amplitude: 14.125 mV
Lead Channel Sensing Intrinsic Amplitude: 2 mV
Lead Channel Setting Pacing Amplitude: 2 V
Lead Channel Setting Pacing Amplitude: 2 V
Lead Channel Setting Pacing Amplitude: 2.5 V
Lead Channel Setting Pacing Pulse Width: 0.4 ms
Lead Channel Setting Pacing Pulse Width: 0.4 ms
MDC IDC LEAD IMPLANT DT: 20100205
MDC IDC LEAD LOCATION: 753858
MDC IDC LEAD LOCATION: 753860
MDC IDC MSMT BATTERY REMAINING LONGEVITY: 29 mo
MDC IDC MSMT BATTERY VOLTAGE: 2.93 V
MDC IDC MSMT LEADCHNL LV IMPEDANCE VALUE: 589 Ohm
MDC IDC MSMT LEADCHNL LV PACING THRESHOLD PULSEWIDTH: 0.4 ms
MDC IDC MSMT LEADCHNL RA IMPEDANCE VALUE: 456 Ohm
MDC IDC MSMT LEADCHNL RA PACING THRESHOLD AMPLITUDE: 0.5 V
MDC IDC MSMT LEADCHNL RA SENSING INTR AMPL: 2 mV
MDC IDC MSMT LEADCHNL RV IMPEDANCE VALUE: 342 Ohm
MDC IDC MSMT LEADCHNL RV PACING THRESHOLD AMPLITUDE: 0.625 V
MDC IDC MSMT LEADCHNL RV PACING THRESHOLD PULSEWIDTH: 0.4 ms
MDC IDC PG IMPLANT DT: 20140226
MDC IDC SESS DTM: 20180531041806
MDC IDC SET LEADCHNL RV SENSING SENSITIVITY: 0.6 mV
MDC IDC STAT BRADY AP VP PERCENT: 0 %

## 2016-08-11 ENCOUNTER — Other Ambulatory Visit (HOSPITAL_COMMUNITY): Payer: Self-pay | Admitting: Internal Medicine

## 2016-08-12 ENCOUNTER — Encounter: Payer: Self-pay | Admitting: Cardiology

## 2016-08-16 ENCOUNTER — Telehealth (HOSPITAL_COMMUNITY): Payer: Self-pay | Admitting: Cardiology

## 2016-08-16 ENCOUNTER — Ambulatory Visit (HOSPITAL_COMMUNITY)
Admission: RE | Admit: 2016-08-16 | Discharge: 2016-08-16 | Disposition: A | Discharge status: Home / Self Care | Payer: 59 | Source: Ambulatory Visit | Attending: Internal Medicine | Admitting: Internal Medicine

## 2016-08-16 VITALS — BP 102/76 | HR 96 | Wt 215.2 lb

## 2016-08-16 DIAGNOSIS — G4733 Obstructive sleep apnea (adult) (pediatric): Secondary | ICD-10-CM

## 2016-08-16 DIAGNOSIS — Z8551 Personal history of malignant neoplasm of bladder: Secondary | ICD-10-CM | POA: Diagnosis not present

## 2016-08-16 DIAGNOSIS — N183 Chronic kidney disease, stage 3 unspecified: Secondary | ICD-10-CM

## 2016-08-16 DIAGNOSIS — E039 Hypothyroidism, unspecified: Secondary | ICD-10-CM | POA: Insufficient documentation

## 2016-08-16 DIAGNOSIS — Z9989 Dependence on other enabling machines and devices: Secondary | ICD-10-CM

## 2016-08-16 DIAGNOSIS — I2510 Atherosclerotic heart disease of native coronary artery without angina pectoris: Secondary | ICD-10-CM | POA: Insufficient documentation

## 2016-08-16 DIAGNOSIS — R05 Cough: Secondary | ICD-10-CM | POA: Diagnosis not present

## 2016-08-16 DIAGNOSIS — I5042 Chronic combined systolic (congestive) and diastolic (congestive) heart failure: Secondary | ICD-10-CM | POA: Diagnosis present

## 2016-08-16 DIAGNOSIS — I130 Hypertensive heart and chronic kidney disease with heart failure and stage 1 through stage 4 chronic kidney disease, or unspecified chronic kidney disease: Secondary | ICD-10-CM | POA: Insufficient documentation

## 2016-08-16 DIAGNOSIS — I10 Essential (primary) hypertension: Secondary | ICD-10-CM | POA: Diagnosis not present

## 2016-08-16 DIAGNOSIS — J986 Disorders of diaphragm: Secondary | ICD-10-CM | POA: Diagnosis not present

## 2016-08-16 DIAGNOSIS — Z95810 Presence of automatic (implantable) cardiac defibrillator: Secondary | ICD-10-CM | POA: Insufficient documentation

## 2016-08-16 DIAGNOSIS — D447 Neoplasm of uncertain behavior of aortic body and other paraganglia: Secondary | ICD-10-CM

## 2016-08-16 DIAGNOSIS — I5022 Chronic systolic (congestive) heart failure: Secondary | ICD-10-CM | POA: Diagnosis not present

## 2016-08-16 DIAGNOSIS — M109 Gout, unspecified: Secondary | ICD-10-CM | POA: Insufficient documentation

## 2016-08-16 DIAGNOSIS — Z79899 Other long term (current) drug therapy: Secondary | ICD-10-CM | POA: Insufficient documentation

## 2016-08-16 DIAGNOSIS — I509 Heart failure, unspecified: Secondary | ICD-10-CM

## 2016-08-16 LAB — CBC WITH DIFFERENTIAL/PLATELET
BASOS PCT: 0 %
Basophils Absolute: 0 10*3/uL (ref 0.0–0.1)
EOS ABS: 0.2 10*3/uL (ref 0.0–0.7)
EOS PCT: 2 %
HCT: 39.9 % (ref 39.0–52.0)
HEMOGLOBIN: 13.5 g/dL (ref 13.0–17.0)
LYMPHS ABS: 0.8 10*3/uL (ref 0.7–4.0)
Lymphocytes Relative: 10 %
MCH: 29.3 pg (ref 26.0–34.0)
MCHC: 33.8 g/dL (ref 30.0–36.0)
MCV: 86.6 fL (ref 78.0–100.0)
Monocytes Absolute: 0.7 10*3/uL (ref 0.1–1.0)
Monocytes Relative: 9 %
NEUTROS PCT: 79 %
Neutro Abs: 6.5 10*3/uL (ref 1.7–7.7)
PLATELETS: 144 10*3/uL — AB (ref 150–400)
RBC: 4.61 MIL/uL (ref 4.22–5.81)
RDW: 14.3 % (ref 11.5–15.5)
WBC: 8.2 10*3/uL (ref 4.0–10.5)

## 2016-08-16 LAB — BASIC METABOLIC PANEL
ANION GAP: 9 (ref 5–15)
BUN: 59 mg/dL — ABNORMAL HIGH (ref 6–20)
CALCIUM: 9.6 mg/dL (ref 8.9–10.3)
CO2: 26 mmol/L (ref 22–32)
Chloride: 93 mmol/L — ABNORMAL LOW (ref 101–111)
Creatinine, Ser: 4.07 mg/dL — ABNORMAL HIGH (ref 0.61–1.24)
GFR, EST AFRICAN AMERICAN: 17 mL/min — AB (ref >60)
GFR, EST NON AFRICAN AMERICAN: 15 mL/min — AB (ref >60)
Glucose, Bld: 142 mg/dL — ABNORMAL HIGH (ref 65–99)
Potassium: 4.4 mmol/L (ref 3.5–5.1)
Sodium: 128 mmol/L — ABNORMAL LOW (ref 135–145)

## 2016-08-16 LAB — MAGNESIUM: Magnesium: 2.4 mg/dL (ref 1.7–2.4)

## 2016-08-16 NOTE — Progress Notes (Signed)
Advanced Heart Failure Medication Review by a Pharmacist  Does the patient  feel that his/her medications are working for him/her?  yes  Has the patient been experiencing any side effects to the medications prescribed?  no  Does the patient measure his/her own blood pressure or blood glucose at home?  no   Does the patient have any problems obtaining medications due to transportation or finances?   no  Understanding of regimen: good Understanding of indications: good Potential of compliance: good Patient understands to avoid NSAIDs. Patient understands to avoid decongestants.  Issues to address at subsequent visits: none  Pharmacist comments: [name] is a pleasant 56 y/o M who presented with family members and a medication list. He reports good adherence to his medications. He endorses no medication-related questions or concerns at this time.   [name]  Time with patient: 7 minutes Preparation and documentation time: 15 minutes Total time: 23 minutes

## 2016-08-16 NOTE — Patient Instructions (Signed)
Labs today (will call for abnormal results, otherwise no news is good news)  Please call us if you start to have fever/chills.  Follow up in 6-8 weeks with Dr. Haroldine Laws

## 2016-08-16 NOTE — Progress Notes (Signed)
Patient ID: [name], male   DOB: 05/08/1960, 56 y.o.   MRN: [id number]    Advanced Heart Failure Clinic Note   PCP: Dr Rex Kras  Nephrologist: Dr Marval Regal  Cardiologist: Dr Caryl Comes Urologist: Dr Karsten Ro  HPI: [name] is a 56 y.o. 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   He is intolerant to numerous HF meds including spiro (severe hyperkalemia - requiring HD), hydralazine (fuzzy-headed) and imdur (severe HAs). S/P 12/17/12 bladder cancer removal.   11/29/11 ECHO EF 15% Diffuse hypokinesis. Features are consistent with a pseudonormal left ventricular filling pattern, with concomitant abnormal relaxation and increased filling pressure (grade 2 diastolic dysfunction).   Admitted to [facility] 01/26/2012 after his ICD fired 5 times Potassium >7.5 Required urgent dialysis. Discharged form [facility] 01/29/12. Discharge weight 250 pounds.   CPX 07/12/13 FVC 1.55 (37%)  FEV1 1.19 (36%)  FEV1/FVC 77%  MVV 53 (35%) Exercise Time: 3:30 Speed (mph): 1.5 Grade (%): 0  RPE: 15 Resting HR: 109 Peak HR: 141 (84% age predicted max HR) BP rest: 130/90 BP peak: 142/96 Peak VO2: 9.0 (34.7% predicted peak VO2)  VE/VCO2 slope: 32.0 OUES: 1.90 Peak RER: 0.90 Ventilatory Threshold: Could not be detected VE/MVV: 58.5% PETCO2 at peak: 33 O2pulse: 8 (44% predicted O2pulse)   He is S/P 02/13/2014 RVATS and R thoracotomy  With resection of paraganglioma. Required milrinone support post-operatively.   Admitted 06/06/2016 with acute resp failure requiring intubation 2/2 to volume overload and septic shock. BCx 1/2 + for strep viridans. No endocarditis or vegetation noted on TEE 06/15/16. Extubated 06/18/16. 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  Diuresed from 245 to 224 on discharge. Spent nearly a week in CIR prior to d/c. Discharge weight was 224 lbs.   ECHO 06/09/2016 EF 20%, severe LV dilation, Mild AI, Mod MR, Mod LAE.   Readmitted 5/9-15/2018 with low output HF. Started on milrinone. Discharge weight 218->213.   He presents today for regular follow up. At last visit torsemide increased. Weight down 4 lbs. Breathing has been OK.  Not sleeping well.  Tried xanax and it was much too sedating. Trying melatonin now.  Took torsemide W/Th/Friday last week per [facility] and was hypotensive and clammy.  No problems with PICC.  Fatigue isnt any better, or any worse. Constant dry, hacking cough. Denies production, fever, or chills.    Optivol: Fluid index below threshold with gentle up slope. Thoracic impedence depressed. No AT/AF. No VT/VF since May.  Activity level nearly 0. Personally reviewed.   RHC 07/13/2016 Hemodynamics (mmHg) RA mean 19 RV 85/18 PA 82/44 PCWP 36 AO 97% Cardiac Output (Fick) 2.79 Cardiac Index (Fick) 1.29   PFTs 05/2016 FVC 1.22 (32%) FEV1 0.95 (32%) TLC 3.58 (53%) DLCO 11.8 (42%)  Labs  12/12/12 dig level 0.9 12/18/12 Magnesium 1.1 K 4.0 Creatinine 1.5 01/02/13: Uric acid 12.9, K+ 3.9, Cr 1.37, AST 21, ALT 19, Mag 1.3 01/29/13 K 4.1 Creatinine 1.5 Magnesium 1.5 03/21/13 Magnesium 1.7 TSH 1.11--> synthorid increased to 100 mcg.  7/15: K 4, creatinine 1.35 8/15: digoxin 0.5 12/2013: K+ 4.4, creatinine 1.39 7/16 K 4.9, creatinine 1.58  Review of systems complete and found to be negative unless listed in HPI.    Past Medical History:  Diagnosis Date  . Arthritis    GOUT  .  Automatic implantable cardioverter-defibrillator in situ   . Biventricular ICD (implantable cardiac defibrillator) Medtronic ]    DOI 2008/ upgrade 2010/ Gen Change 2014  . Bladder tumor    PT HOSP AT [facility] 4/24 TO 4/26 2014 WITH UTI--AND FOUND TO HAVE BLADDER TUMOR-  . Cardiomyopathy    Idiopathic dilated;   . CHF (congestive heart  failure) ([place])   . CKD (chronic kidney disease)    stage III baseline Crt 1.8-2.0  . Coronary artery disease   . Gout 08/03/12   PT C/O OF RIGHT KNEE PAIN AND SWELLING -STATES GOUT FLARE UP - ONGOING SINCE APRIL - BUT SWELLING W/IN LAST WEEK  . Hilar mass    Noted CT 2010  . HTN (hypertension)    Dr. Caryl Comes cardiologist  . Hypothyroidism   . Paraganglioma ([place])    //neuroendocrine tumor of the right chest per notes 02/10/2014  . Sleep apnea    CPAP  . Systolic CHF ([place])    EF 01%  . Ventricular tachycardia [facility])    s/p ICD    Current Outpatient Prescriptions  Medication Sig Dispense Refill  . atorvastatin (LIPITOR) 40 MG tablet Take 1 tablet (40 mg total) by mouth at bedtime. 30 tablet 2  . BREO ELLIPTA 100-25 MCG/INH AEPB Inhale 1 puff into the lungs daily as needed (for shortness of breath).   11  . diphenhydrAMINE (BENADRYL CHILDRENS ALLERGY) 12.5 MG/5ML liquid Take 1 tablet by mouth daily as needed.    . febuxostat (ULORIC) 40 MG tablet Take 40 mg by mouth daily.    Marland Kitchen HYDROcodone-acetaminophen (NORCO/VICODIN) 5-325 MG tablet Take 0.5-1 tablets by mouth every 6 (six) hours as needed for moderate pain. 20 tablet 0  . ivabradine (CORLANOR) 5 MG TABS tablet Take 2.5 mg by mouth 2 (two) times daily with a meal.    . loratadine (CLARITIN) 10 MG tablet Take 1 tablet (10 mg total) by mouth daily. 30 tablet 0  . Magnesium 400 MG TABS Take 400 mg by mouth 2 (two) times daily. 180 tablet 2  . Melatonin 5 MG CAPS Take 1 capsule by mouth daily as needed.    . milrinone (PRIMACOR) 20 MG/100 ML SOLN infusion Inject 12.3625 mcg/min into the vein continuous. 100 mL 0  . MITIGARE 0.6 MG CAPS Take 0.6 mg by mouth daily.    Marland Kitchen omeprazole (PRILOSEC) 40 MG capsule Take 40 mg by mouth daily as needed (acid reflux).     Marland Kitchen tobramycin-dexamethasone (TOBRADEX) ophthalmic solution Place 1 drop into the right eye 2 (two) times daily.    Marland Kitchen torsemide (DEMADEX) 20 MG tablet Take 2 tabs Every Mon, Wed and Friday,  take extra pill on off days for weight gain or swelling. 30 tablet 6   No current facility-administered medications for this encounter.     Vitals:   08/16/16 1047  BP: 102/76  Pulse: 96  SpO2: 96%  Weight: 215 lb 3.2 oz (97.6 kg)   Wt Readings from Last 3 Encounters:  08/16/16 215 lb 3.2 oz (97.6 kg)  07/26/16 219 lb 8 oz (99.6 kg)  07/19/16 213 lb 14.4 oz (97 kg)    PHYSICAL EXAM: General:Fatigued appearing. NAD  HEENT: Normal Neck: supple. JVP 7-8 cm. Carotids 2+ bilat; no bruits. No thyromegaly or nodule noted. Cor: PMI nondisplaced. RRR, No M/G/R noted. +S3. Lungs: Diminished basilar sounds. R base nearly absent. Abdomen: soft, non-tender, distended, no HSM. No bruits or masses. +BS  Extremities: no cyanosis, clubbing, or rash. 1-2+ ankle edema.  Neuro: alert & orientedx3, cranial nerves grossly intact. moves all 4 extremities w/o difficulty. Affect pleasant   ASSESSMENT & PLAN:  1. Chronic sysotolic CHF.  - Echo 08/09/7844 with EF 20%. 15% by TEE 06/15/16.  - Remains NYHA IIIB despite recent initiation of milrinone. Overall stable.  - Volume status mildly elevated.   - Continue torsemide 40 mg M/W/F with extra as needed. - No b-blocker due to low output. No ACE/ARB/ARNI with CKD - Unable to tolerate hydralazine due to low BP. Failed nitrates with sever HAs  - Long talk with him and his son about limited options given previous sternotomy, RV failure, paralyzed R hemidiaphragm and advanced CKD - He has seen Dr. Mosetta Pigeon in consult to consider for Heart/Kidney transplant. Unfortunately, it looks like he will not be a good candidate for any advanced therapies. Current plan is to try on 02 therapy to see if this will help with his therapies. Will discuss further with MD at next visit.  2. CKD III-IV - Last creatinine stable at 2.7. Labs today.  3. Paralzyed right hemidiaphragm.  - Too ill to consider diaphragm plication at this time. No change.  4. History of paraganglioma  resection - Stable.  5. OSA on CPAP - Wears CPAP but intolerant for the whole night.  Had recent adjustment.  6. Deconditioning - On-going with PT.  7. Cough - Chronic component and multifactorial with diaphragmatic paralysis.  - OTC rx should suffice for now.  No indication of PNA or URI.  Denies fevers or chills.   Stable on current meds. Labs today. Continue PT. Encouraged to increase activity as tolerated. RTC 6-8  Weeks with Dr. Haroldine Laws.   Shirley Friar, PA-C  08/16/2016

## 2016-08-16 NOTE — Telephone Encounter (Signed)
-----   Message from Shirley Friar, PA-C sent at 08/16/2016 12:17 PM EDT ----- Creatinine increase from previous. Took extra torsemide last week and felt worse over the weekend with fatigue and lightheadedness. ? Over diuresis. Volume status now trending up on Optivol.     Would have him continue torsemide as ordered for now.  Can we check BMET and Coox Friday am?    Lollie Marrow, [place] 08/16/2016 12:16 PM

## 2016-08-16 NOTE — Telephone Encounter (Signed)
Patient aware. PATIENT VOICED UNDERSTANDING REPEAT LABS 6/15

## 2016-08-22 ENCOUNTER — Inpatient Hospital Stay (HOSPITAL_COMMUNITY): Admission: RE | Admit: 2016-08-22 | Payer: 59 | Source: Ambulatory Visit

## 2016-08-23 ENCOUNTER — Telehealth (HOSPITAL_COMMUNITY): Payer: Self-pay | Admitting: *Deleted

## 2016-08-23 ENCOUNTER — Other Ambulatory Visit (HOSPITAL_COMMUNITY): Payer: Self-pay | Admitting: Internal Medicine

## 2016-08-23 MED ORDER — BENZONATATE 100 MG PO CAPS: 100.0000 mg | ORAL_CAPSULE | Freq: Three times a day (TID) | ORAL | 0 refills | Status: AC | PRN

## 2016-08-23 NOTE — Telephone Encounter (Signed)
[name] with [facility] called saying patient is still complaining of dry cough, he feels that its from drainage and not heart related.  He is currently taking Claritin and benadryl but no real relief.    I spoke with Barrington Ellison, PA and he advises him to take tessalon pearls 100 mg (1 Cap) every 8 hours as needed.  [name] is aware and will let patient know.  I have sent medication to local pharmacy.

## 2016-08-26 ENCOUNTER — Encounter: Payer: Self-pay | Admitting: Cardiology

## 2016-09-02 ENCOUNTER — Encounter (HOSPITAL_COMMUNITY): Payer: Self-pay | Admitting: *Deleted

## 2016-09-02 NOTE — Progress Notes (Signed)
Received FMLA request from [employer] for patient's family member [employer].  Paperwork completed and signed.  I have tried calling patient twice with no returned call so I'm going to mail packet today.  Detailed message left on patient's answering machine.    Copy of completed forms will be scanned to patient's electronic medical record.

## 2016-09-02 NOTE — Progress Notes (Signed)
Patient dropped off a physical residual functional capacity questionnaire for our office to complete.  Form completed and mailed back to patient today.  I left VM telling him I was mailing form to his home address today.   Copy of completed form will be scanned to patient's electronic medical record.

## 2016-09-06 ENCOUNTER — Telehealth: Payer: Self-pay | Admitting: Cardiology

## 2016-09-06 NOTE — Telephone Encounter (Signed)
LMOVM reminding pt to send remote transmission.   

## 2016-09-09 NOTE — Progress Notes (Signed)
No ICM remote transmission received for 09/06/2016 and next ICM transmission scheduled for 09/26/2016.

## 2016-09-12 ENCOUNTER — Other Ambulatory Visit: Payer: Self-pay | Admitting: Internal Medicine

## 2016-09-13 ENCOUNTER — Encounter (HOSPITAL_COMMUNITY): Payer: 59 | Admitting: Internal Medicine

## 2016-09-29 NOTE — Progress Notes (Signed)
No ICM remote transmission received for 09/26/2016 and next ICM transmission scheduled for 10/17/2016.

## 2016-10-05 DEATH — deceased

## 2016-10-13 ENCOUNTER — Encounter (HOSPITAL_COMMUNITY): Payer: 59 | Admitting: Internal Medicine

## 2016-11-02 ENCOUNTER — Encounter (HOSPITAL_COMMUNITY): Payer: Self-pay | Admitting: *Deleted

## 2016-11-02 NOTE — Progress Notes (Signed)
Received medical records request from [facility] on 11/02/2016 CASE # [id number]  Requested records faxed today to [phone] Original request will be scanned to patient's electronic medical record.

## 2017-11-05 IMAGING — CR DG CHEST 1V PORT
1 series · 1 of 1 positions shown · non-contrast
Comparison: 06/20/2016

CLINICAL DATA: Acute on chronic CHF

EXAM:
PORTABLE CHEST 1 VIEW

[AP]
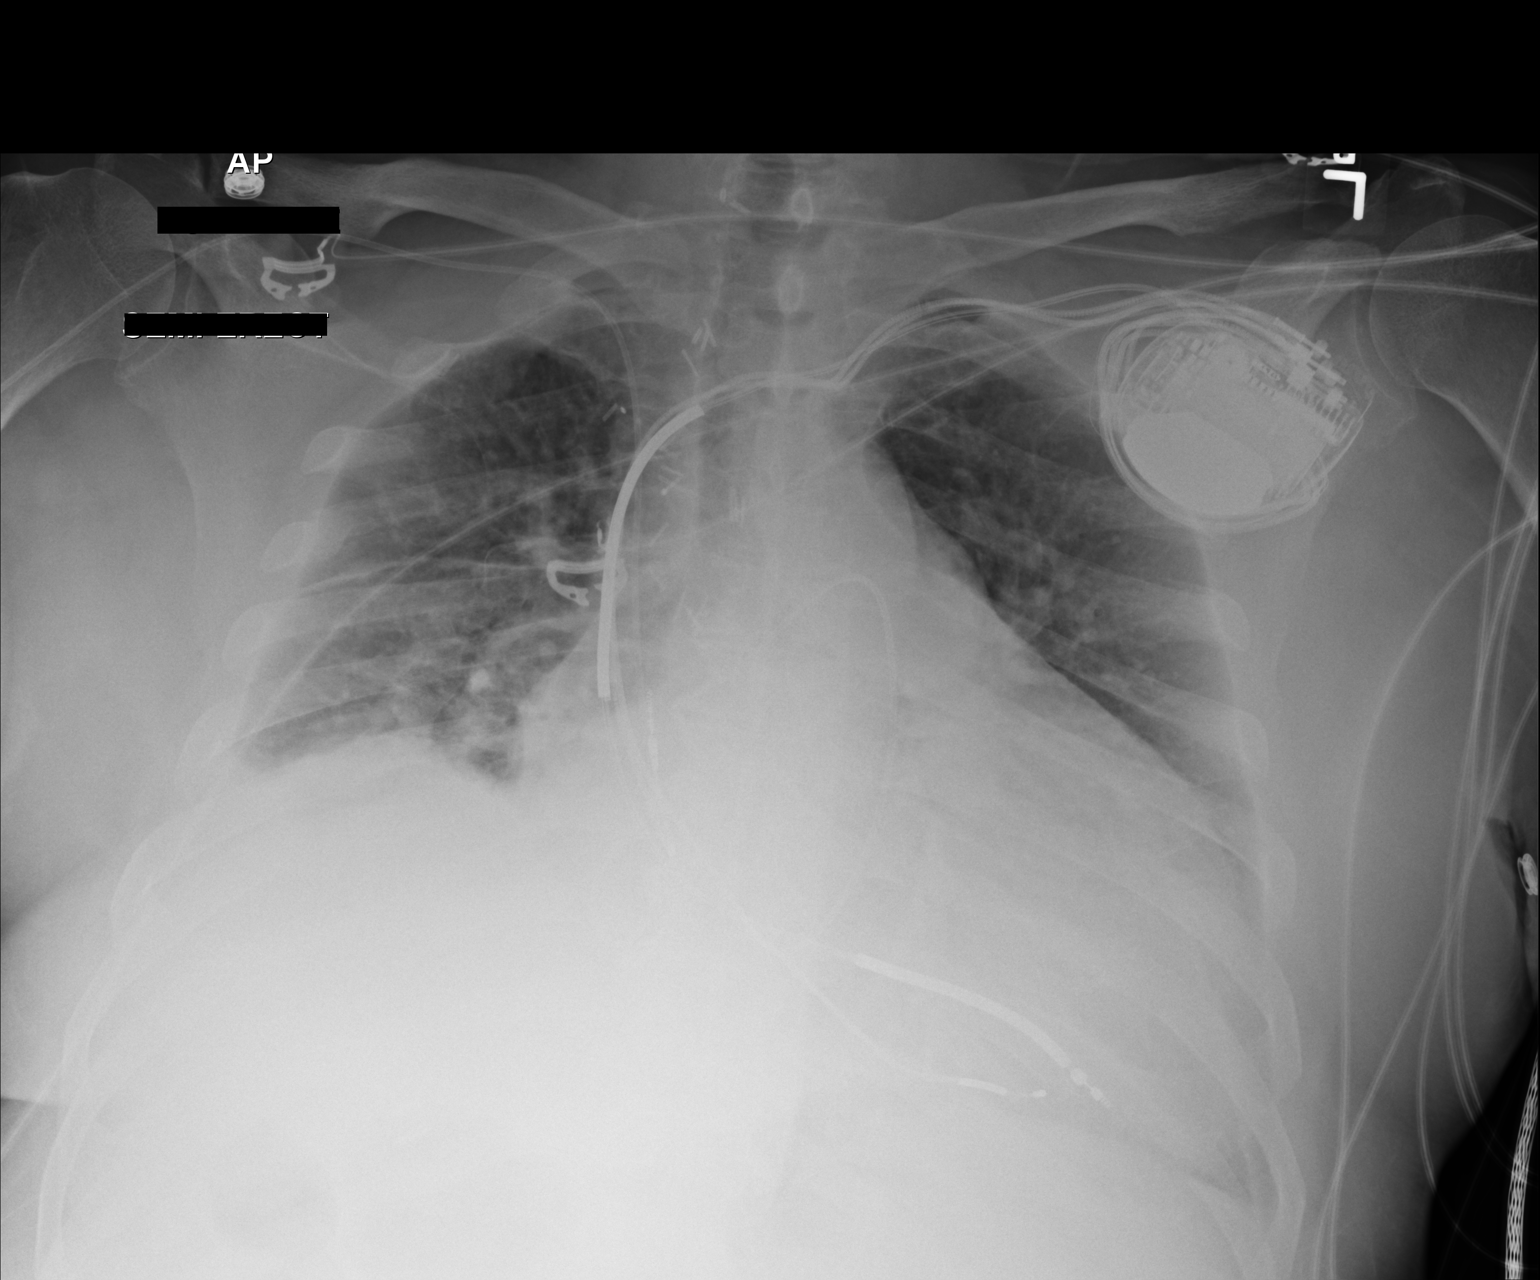

[1 of 1 positions shown; findings below may reference images not displayed]

FINDINGS: Low volume chest with greater diaphragm elevation on the right.
Chronic cardiopericardial enlargement. ICD/ pacer from the left in
stable position. Swan-Ganz catheter with tip at the right main
pulmonary artery. Vascular congestion. No interstitial edema or
definite effusion. No pneumothorax.
IMPRESSION: 1. Low volume chest. Asymmetric elevated right diaphragm, reportedly
paralyzed.
2. Cardiomegaly and vascular congestion.
3. Porquet catheter with tip at the right right main or inter lobar
pulmonary artery.
